# Patient Record
Sex: Male | Born: 1940 | Race: White | Hispanic: No | Marital: Married | Smoking: Former smoker
Health system: Southern US, Community
[De-identification: ages and names within clinical notes are randomized; demographics above are authoritative.]

## PROBLEM LIST (undated history)

## (undated) DIAGNOSIS — I2510 Atherosclerotic heart disease of native coronary artery without angina pectoris: Secondary | ICD-10-CM

## (undated) DIAGNOSIS — M1990 Unspecified osteoarthritis, unspecified site: Secondary | ICD-10-CM

## (undated) DIAGNOSIS — N185 Chronic kidney disease, stage 5: Secondary | ICD-10-CM

## (undated) DIAGNOSIS — I4891 Unspecified atrial fibrillation: Secondary | ICD-10-CM

## (undated) DIAGNOSIS — D759 Disease of blood and blood-forming organs, unspecified: Secondary | ICD-10-CM

## (undated) DIAGNOSIS — M5431 Sciatica, right side: Secondary | ICD-10-CM

## (undated) DIAGNOSIS — Z950 Presence of cardiac pacemaker: Secondary | ICD-10-CM

## (undated) DIAGNOSIS — J189 Pneumonia, unspecified organism: Secondary | ICD-10-CM

## (undated) DIAGNOSIS — G4733 Obstructive sleep apnea (adult) (pediatric): Secondary | ICD-10-CM

## (undated) DIAGNOSIS — D649 Anemia, unspecified: Secondary | ICD-10-CM

## (undated) DIAGNOSIS — F419 Anxiety disorder, unspecified: Secondary | ICD-10-CM

## (undated) DIAGNOSIS — I509 Heart failure, unspecified: Secondary | ICD-10-CM

## (undated) DIAGNOSIS — I219 Acute myocardial infarction, unspecified: Secondary | ICD-10-CM

## (undated) DIAGNOSIS — IMO0001 Reserved for inherently not codable concepts without codable children: Secondary | ICD-10-CM

## (undated) DIAGNOSIS — N3091 Cystitis, unspecified with hematuria: Secondary | ICD-10-CM

## (undated) DIAGNOSIS — IMO0002 Reserved for concepts with insufficient information to code with codable children: Secondary | ICD-10-CM

## (undated) DIAGNOSIS — S46219A Strain of muscle, fascia and tendon of other parts of biceps, unspecified arm, initial encounter: Secondary | ICD-10-CM

## (undated) DIAGNOSIS — B351 Tinea unguium: Secondary | ICD-10-CM

## (undated) DIAGNOSIS — I38 Endocarditis, valve unspecified: Secondary | ICD-10-CM

## (undated) DIAGNOSIS — I10 Essential (primary) hypertension: Secondary | ICD-10-CM

## (undated) DIAGNOSIS — D869 Sarcoidosis, unspecified: Secondary | ICD-10-CM

## (undated) DIAGNOSIS — M7138 Other bursal cyst, other site: Secondary | ICD-10-CM

## (undated) DIAGNOSIS — K759 Inflammatory liver disease, unspecified: Secondary | ICD-10-CM

## (undated) DIAGNOSIS — T4145XA Adverse effect of unspecified anesthetic, initial encounter: Secondary | ICD-10-CM

## (undated) DIAGNOSIS — E119 Type 2 diabetes mellitus without complications: Secondary | ICD-10-CM

## (undated) DIAGNOSIS — I4892 Unspecified atrial flutter: Secondary | ICD-10-CM

## (undated) HISTORY — PX: PORTACATH PLACEMENT: SHX2246

## (undated) HISTORY — DX: Atherosclerotic heart disease of native coronary artery without angina pectoris: I25.10

## (undated) HISTORY — DX: Unspecified atrial fibrillation: I48.91

## (undated) HISTORY — DX: Acute myocardial infarction, unspecified: I21.9

## (undated) HISTORY — DX: Hypercalcemia: E83.52

## (undated) HISTORY — PX: HERNIA REPAIR: SHX51

## (undated) HISTORY — DX: Disease of blood and blood-forming organs, unspecified: D75.9

## (undated) HISTORY — DX: Sarcoidosis, unspecified: D86.9

## (undated) HISTORY — DX: Obstructive sleep apnea (adult) (pediatric): G47.33

## (undated) HISTORY — PX: CARDIAC VALVE REPLACEMENT: SHX585

## (undated) HISTORY — PX: CARDIOVERSION: SHX1299

## (undated) HISTORY — DX: Essential (primary) hypertension: I10

## (undated) HISTORY — PX: OTHER SURGICAL HISTORY: SHX169

## (undated) HISTORY — DX: Endocarditis, valve unspecified: I38

## (undated) HISTORY — DX: Cystitis, unspecified with hematuria: N30.91

## (undated) HISTORY — DX: Chronic kidney disease, stage 5: N18.5

---

## 1975-12-25 HISTORY — PX: VASECTOMY: SHX75

## 1999-02-28 ENCOUNTER — Ambulatory Visit (HOSPITAL_COMMUNITY): Admission: RE | Admit: 1999-02-28 | Discharge: 1999-02-28 | Payer: Self-pay | Admitting: Cardiology

## 2000-12-30 ENCOUNTER — Encounter (INDEPENDENT_AMBULATORY_CARE_PROVIDER_SITE_OTHER): Payer: Self-pay | Admitting: Specialist

## 2000-12-30 ENCOUNTER — Other Ambulatory Visit: Admission: RE | Admit: 2000-12-30 | Discharge: 2000-12-30 | Payer: Self-pay | Admitting: Urology

## 2001-01-31 ENCOUNTER — Encounter: Payer: Self-pay | Admitting: Urology

## 2001-02-07 ENCOUNTER — Observation Stay: Admission: RE | Admit: 2001-02-07 | Discharge: 2001-02-09 | Payer: Self-pay | Admitting: Urology

## 2001-04-21 ENCOUNTER — Encounter: Payer: Self-pay | Admitting: Internal Medicine

## 2001-12-29 ENCOUNTER — Encounter: Payer: Self-pay | Admitting: Cardiology

## 2001-12-29 ENCOUNTER — Inpatient Hospital Stay (HOSPITAL_COMMUNITY): Admission: EM | Admit: 2001-12-29 | Discharge: 2002-01-02 | Payer: Self-pay | Admitting: Emergency Medicine

## 2004-12-24 HISTORY — PX: CORONARY ARTERY BYPASS GRAFT: SHX141

## 2005-09-01 ENCOUNTER — Inpatient Hospital Stay (HOSPITAL_COMMUNITY): Admission: EM | Admit: 2005-09-01 | Discharge: 2005-09-18 | Payer: Self-pay | Admitting: Emergency Medicine

## 2005-09-10 ENCOUNTER — Encounter: Payer: Self-pay | Admitting: Surgery

## 2005-11-23 ENCOUNTER — Ambulatory Visit (HOSPITAL_COMMUNITY): Admission: RE | Admit: 2005-11-23 | Discharge: 2005-11-23 | Payer: Self-pay | Admitting: Cardiology

## 2005-12-24 HISTORY — PX: OTHER SURGICAL HISTORY: SHX169

## 2006-01-24 ENCOUNTER — Encounter (HOSPITAL_COMMUNITY): Admission: RE | Admit: 2006-01-24 | Discharge: 2006-04-24 | Payer: Self-pay | Admitting: Cardiology

## 2006-04-25 ENCOUNTER — Encounter (HOSPITAL_COMMUNITY): Admission: RE | Admit: 2006-04-25 | Discharge: 2006-07-24 | Payer: Self-pay | Admitting: Cardiology

## 2007-06-03 ENCOUNTER — Ambulatory Visit: Payer: Self-pay | Admitting: Vascular Surgery

## 2007-10-30 ENCOUNTER — Ambulatory Visit (HOSPITAL_COMMUNITY): Admission: RE | Admit: 2007-10-30 | Discharge: 2007-10-30 | Payer: Self-pay | Admitting: Endocrinology

## 2007-11-11 ENCOUNTER — Ambulatory Visit (HOSPITAL_COMMUNITY): Admission: RE | Admit: 2007-11-11 | Discharge: 2007-11-11 | Payer: Self-pay | Admitting: Endocrinology

## 2007-11-11 ENCOUNTER — Ambulatory Visit: Payer: Self-pay | Admitting: Oncology

## 2007-11-11 LAB — CBC & DIFF AND RETIC
BASO%: 0.5 % (ref 0.0–2.0)
Eosinophils Absolute: 0.2 10*3/uL (ref 0.0–0.5)
IRF: 0.37 (ref 0.070–0.380)
LYMPH%: 12.7 % — ABNORMAL LOW (ref 14.0–48.0)
MCHC: 34.8 g/dL (ref 32.0–35.9)
MONO#: 0.6 10*3/uL (ref 0.1–0.9)
NEUT#: 3 10*3/uL (ref 1.5–6.5)
Platelets: 143 10*3/uL — ABNORMAL LOW (ref 145–400)
RBC: 3.64 10*6/uL — ABNORMAL LOW (ref 4.20–5.71)
RDW: 15.9 % — ABNORMAL HIGH (ref 11.2–14.6)
RETIC #: 61.9 10*3/uL (ref 31.8–103.9)
Retic %: 1.7 % (ref 0.7–2.3)
WBC: 4.4 10*3/uL (ref 4.0–10.0)
lymph#: 0.6 10*3/uL — ABNORMAL LOW (ref 0.9–3.3)

## 2007-11-11 LAB — PROTHROMBIN TIME: Prothrombin Time: 15.2 seconds (ref 11.6–15.2)

## 2007-11-11 LAB — APTT: aPTT: 34 seconds (ref 24–37)

## 2007-11-11 LAB — ERYTHROCYTE SEDIMENTATION RATE: Sed Rate: 47 mm/hr — ABNORMAL HIGH (ref 0–20)

## 2007-11-11 LAB — MORPHOLOGY: PLT EST: ADEQUATE

## 2007-11-12 ENCOUNTER — Ambulatory Visit (HOSPITAL_COMMUNITY): Admission: RE | Admit: 2007-11-12 | Discharge: 2007-11-12 | Payer: Self-pay | Admitting: Endocrinology

## 2007-11-13 LAB — KAPPA/LAMBDA LIGHT CHAINS
Kappa free light chain: 1.67 mg/dL (ref 0.33–1.94)
Lambda Free Lght Chn: 2.83 mg/dL — ABNORMAL HIGH (ref 0.57–2.63)

## 2007-11-13 LAB — COMPREHENSIVE METABOLIC PANEL
AST: 13 U/L (ref 0–37)
Alkaline Phosphatase: 60 U/L (ref 39–117)
BUN: 22 mg/dL (ref 6–23)
Creatinine, Ser: 1.16 mg/dL (ref 0.40–1.50)

## 2007-11-13 LAB — CANCER ANTIGEN 19-9: CA 19-9: 13.5 U/mL (ref <35.0)

## 2007-11-13 LAB — FOLATE: Folate: >20 ng/mL

## 2007-11-13 LAB — PSA: PSA: 4.84 ng/mL — ABNORMAL HIGH (ref 0.10–4.00)

## 2007-11-13 LAB — IMMUNOFIXATION ELECTROPHORESIS: IgM, Serum: 92 mg/dL (ref 60–263)

## 2007-11-13 LAB — BETA 2 MICROGLOBULIN, SERUM: Beta-2 Microglobulin: 3.59 mg/L — ABNORMAL HIGH (ref 1.01–1.73)

## 2007-11-13 LAB — VITAMIN B12: Vitamin B-12: 507 pg/mL (ref 211–911)

## 2007-11-19 LAB — URINALYSIS, MICROSCOPIC - CHCC
Bilirubin (Urine): NEGATIVE
Leukocyte Esterase: NEGATIVE
Nitrite: NEGATIVE
pH: 7 (ref 4.6–8.0)

## 2007-11-19 LAB — COMPREHENSIVE METABOLIC PANEL
AST: 21 U/L (ref 0–37)
Albumin: 3.3 g/dL — ABNORMAL LOW (ref 3.5–5.2)
BUN: 19 mg/dL (ref 6–23)
Calcium: 12.7 mg/dL — ABNORMAL HIGH (ref 8.4–10.5)
Chloride: 97 mEq/L (ref 96–112)
Creatinine, Ser: 1.19 mg/dL (ref 0.40–1.50)
Glucose, Bld: 273 mg/dL — ABNORMAL HIGH (ref 70–99)

## 2007-11-22 LAB — URINE CULTURE

## 2007-11-25 ENCOUNTER — Inpatient Hospital Stay (HOSPITAL_COMMUNITY): Admission: EM | Admit: 2007-11-25 | Discharge: 2007-11-28 | Payer: Self-pay | Admitting: Oncology

## 2007-11-25 ENCOUNTER — Ambulatory Visit: Payer: Self-pay | Admitting: Oncology

## 2007-11-27 ENCOUNTER — Encounter (INDEPENDENT_AMBULATORY_CARE_PROVIDER_SITE_OTHER): Payer: Self-pay | Admitting: Interventional Radiology

## 2007-11-27 ENCOUNTER — Encounter (INDEPENDENT_AMBULATORY_CARE_PROVIDER_SITE_OTHER): Payer: Self-pay | Admitting: General Surgery

## 2007-12-02 LAB — COMPREHENSIVE METABOLIC PANEL
Albumin: 3.1 g/dL — ABNORMAL LOW (ref 3.5–5.2)
Alkaline Phosphatase: 48 U/L (ref 39–117)
BUN: 25 mg/dL — ABNORMAL HIGH (ref 6–23)
Calcium: 10.3 mg/dL (ref 8.4–10.5)
Chloride: 100 mEq/L (ref 96–112)
Glucose, Bld: 336 mg/dL — ABNORMAL HIGH (ref 70–99)
Potassium: 4.2 mEq/L (ref 3.5–5.3)
Sodium: 132 mEq/L — ABNORMAL LOW (ref 135–145)
Total Protein: 6.4 g/dL (ref 6.0–8.3)

## 2007-12-02 LAB — CBC WITH DIFFERENTIAL/PLATELET
Basophils Absolute: 0 10*3/uL (ref 0.0–0.1)
Eosinophils Absolute: 0 10*3/uL (ref 0.0–0.5)
HGB: 11.2 g/dL — ABNORMAL LOW (ref 13.0–17.1)
MCV: 87 fL (ref 81.6–98.0)
MONO#: 0.5 10*3/uL (ref 0.1–0.9)
NEUT#: 4.7 10*3/uL (ref 1.5–6.5)
RBC: 3.67 10*6/uL — ABNORMAL LOW (ref 4.20–5.71)
RDW: 16.6 % — ABNORMAL HIGH (ref 11.2–14.6)
WBC: 5.6 10*3/uL (ref 4.0–10.0)
lymph#: 0.4 10*3/uL — ABNORMAL LOW (ref 0.9–3.3)

## 2007-12-23 ENCOUNTER — Ambulatory Visit: Payer: Self-pay | Admitting: Oncology

## 2008-02-23 ENCOUNTER — Ambulatory Visit: Payer: Self-pay | Admitting: Oncology

## 2008-03-19 ENCOUNTER — Encounter: Admission: RE | Admit: 2008-03-19 | Discharge: 2008-03-19 | Payer: Self-pay | Admitting: Endocrinology

## 2008-04-26 ENCOUNTER — Ambulatory Visit: Payer: Self-pay | Admitting: Oncology

## 2008-06-21 ENCOUNTER — Ambulatory Visit: Payer: Self-pay | Admitting: Oncology

## 2008-07-22 IMAGING — CR DG CHEST 1V PORT
1 series · 1 of 1 positions shown · non-contrast
Comparison: 11/23/2005

CLINICAL DATA: Port-A-Cath placement

PORTABLE CHEST - 1 VIEW

[view not recorded]
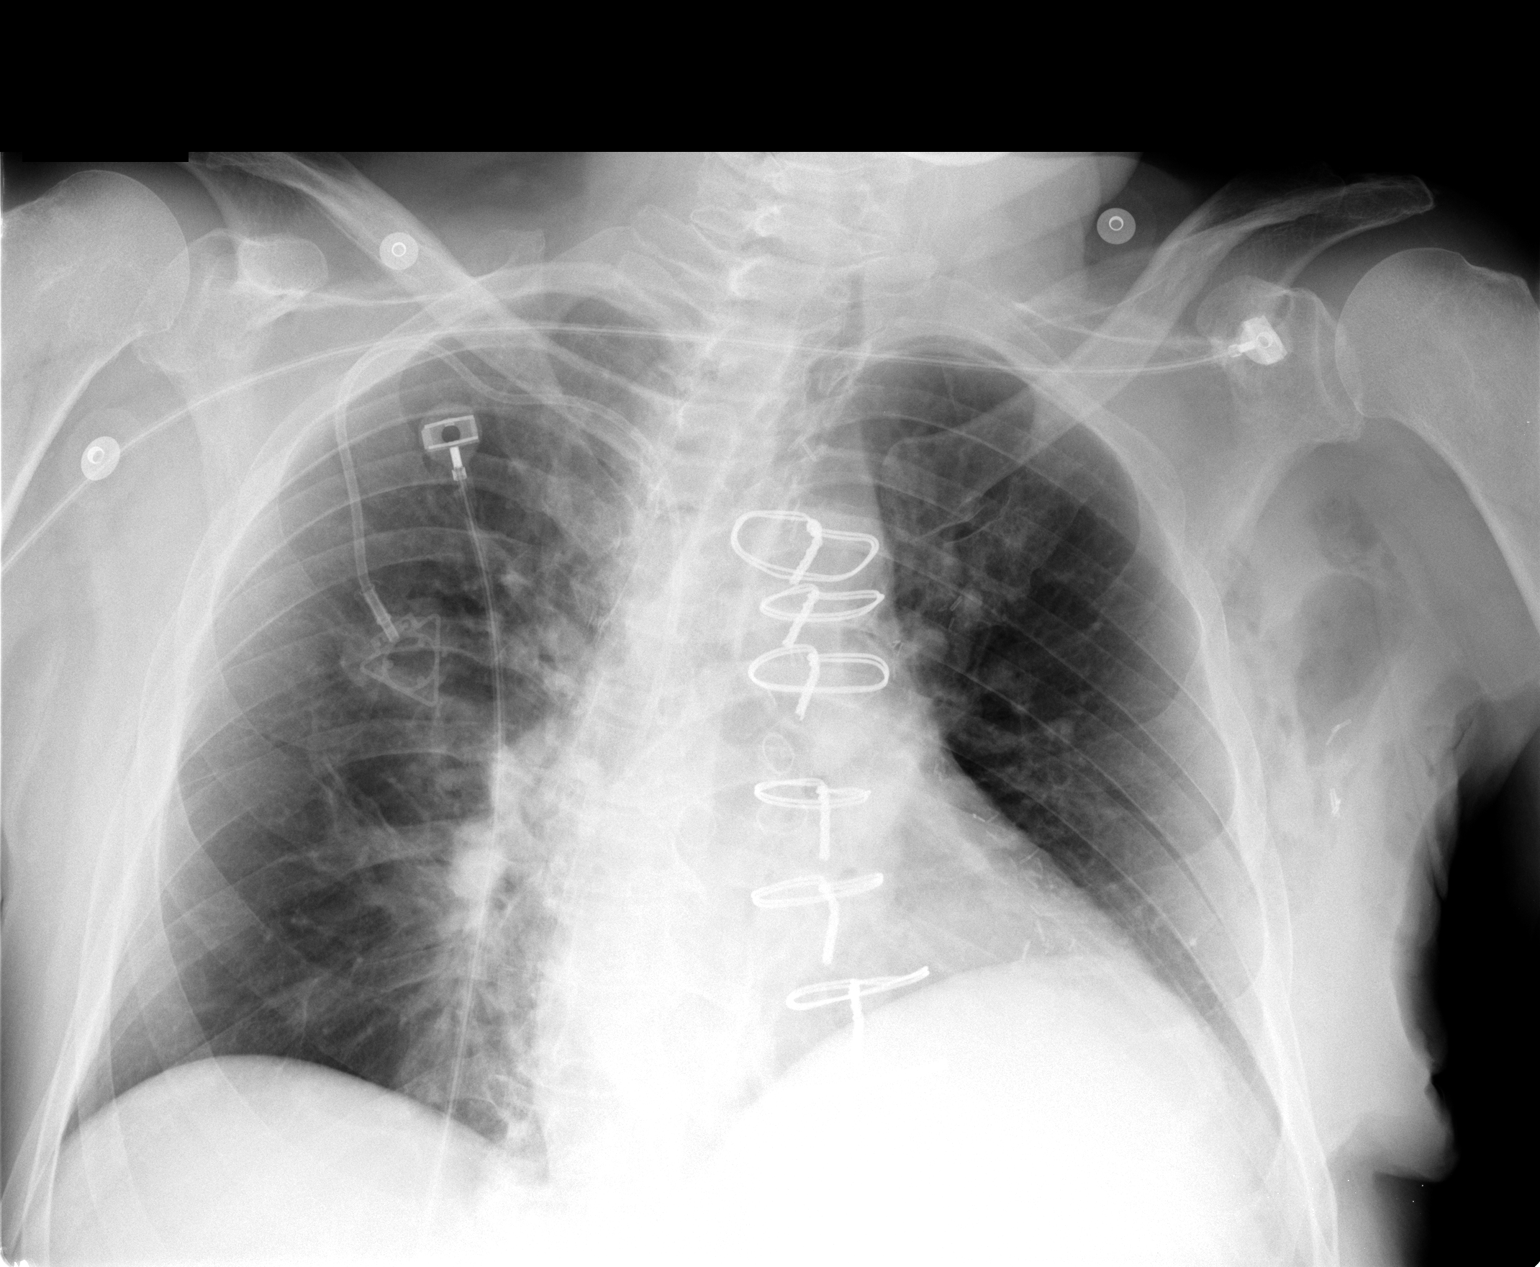

[1 of 1 positions shown; findings below may reference images not displayed]

FINDINGS: Right-sided Port-A-Cath noted with tip projecting over the superior
vena cava. No pneumothorax. There is gas projecting in the left axilla adjacent
to several axillary clips. Patient is moderately rotated the left on today's
exam.

Mild cardiomegaly appear stable. Prior CABG noted.  

IMPRESSION

1. Right-sided Port-A-Cath tip: SVC. No pneumothorax identified.
2. Gas projects of the left axilla, query recent intervention in this region.
3. Cardiomegaly.

## 2008-08-20 ENCOUNTER — Ambulatory Visit: Payer: Self-pay | Admitting: Oncology

## 2008-10-25 ENCOUNTER — Ambulatory Visit: Payer: Self-pay | Admitting: Oncology

## 2008-12-23 ENCOUNTER — Ambulatory Visit: Payer: Self-pay | Admitting: Oncology

## 2009-08-24 ENCOUNTER — Encounter: Admission: RE | Admit: 2009-08-24 | Discharge: 2009-08-24 | Payer: Self-pay | Admitting: Cardiology

## 2009-12-21 ENCOUNTER — Encounter: Admission: RE | Admit: 2009-12-21 | Discharge: 2009-12-21 | Payer: Self-pay | Admitting: Endocrinology

## 2009-12-24 DIAGNOSIS — D869 Sarcoidosis, unspecified: Secondary | ICD-10-CM

## 2009-12-24 HISTORY — DX: Hypercalcemia: E83.52

## 2009-12-24 HISTORY — DX: Sarcoidosis, unspecified: D86.9

## 2010-02-28 ENCOUNTER — Encounter: Admission: RE | Admit: 2010-02-28 | Discharge: 2010-02-28 | Payer: Self-pay | Admitting: Endocrinology

## 2010-03-14 ENCOUNTER — Encounter (INDEPENDENT_AMBULATORY_CARE_PROVIDER_SITE_OTHER): Payer: Self-pay | Admitting: *Deleted

## 2010-05-23 ENCOUNTER — Encounter: Admission: RE | Admit: 2010-05-23 | Discharge: 2010-05-23 | Payer: Self-pay | Admitting: Endocrinology

## 2010-05-23 ENCOUNTER — Ambulatory Visit (HOSPITAL_COMMUNITY): Admission: RE | Admit: 2010-05-23 | Discharge: 2010-05-23 | Payer: Self-pay | Admitting: Endocrinology

## 2010-07-24 ENCOUNTER — Ambulatory Visit: Payer: Self-pay | Admitting: Cardiology

## 2010-07-24 ENCOUNTER — Ambulatory Visit (HOSPITAL_COMMUNITY): Admission: RE | Admit: 2010-07-24 | Discharge: 2010-07-24 | Payer: Self-pay | Admitting: Cardiology

## 2010-07-25 ENCOUNTER — Encounter: Payer: Self-pay | Admitting: Internal Medicine

## 2010-09-19 ENCOUNTER — Encounter: Payer: Self-pay | Admitting: Internal Medicine

## 2010-09-28 ENCOUNTER — Telehealth (INDEPENDENT_AMBULATORY_CARE_PROVIDER_SITE_OTHER): Payer: Self-pay | Admitting: *Deleted

## 2010-10-09 ENCOUNTER — Encounter: Payer: Self-pay | Admitting: Internal Medicine

## 2010-10-24 ENCOUNTER — Ambulatory Visit: Payer: Self-pay | Admitting: Cardiology

## 2010-11-02 ENCOUNTER — Encounter: Payer: Self-pay | Admitting: Internal Medicine

## 2010-11-09 ENCOUNTER — Encounter: Payer: Self-pay | Admitting: Internal Medicine

## 2010-11-24 ENCOUNTER — Telehealth (INDEPENDENT_AMBULATORY_CARE_PROVIDER_SITE_OTHER): Payer: Self-pay | Admitting: *Deleted

## 2010-11-24 ENCOUNTER — Ambulatory Visit: Payer: Self-pay | Admitting: Internal Medicine

## 2010-11-24 DIAGNOSIS — D869 Sarcoidosis, unspecified: Secondary | ICD-10-CM

## 2010-11-24 DIAGNOSIS — I359 Nonrheumatic aortic valve disorder, unspecified: Secondary | ICD-10-CM

## 2010-11-24 DIAGNOSIS — R0602 Shortness of breath: Secondary | ICD-10-CM

## 2010-11-24 DIAGNOSIS — G4733 Obstructive sleep apnea (adult) (pediatric): Secondary | ICD-10-CM

## 2010-11-27 ENCOUNTER — Telehealth: Payer: Self-pay | Admitting: Internal Medicine

## 2010-11-27 ENCOUNTER — Encounter: Payer: Self-pay | Admitting: Internal Medicine

## 2010-11-27 DIAGNOSIS — I259 Chronic ischemic heart disease, unspecified: Secondary | ICD-10-CM

## 2010-11-27 DIAGNOSIS — I219 Acute myocardial infarction, unspecified: Secondary | ICD-10-CM | POA: Insufficient documentation

## 2010-11-27 DIAGNOSIS — I38 Endocarditis, valve unspecified: Secondary | ICD-10-CM | POA: Insufficient documentation

## 2010-11-28 ENCOUNTER — Telehealth: Payer: Self-pay | Admitting: Internal Medicine

## 2010-11-29 ENCOUNTER — Encounter: Payer: Self-pay | Admitting: Internal Medicine

## 2010-11-30 ENCOUNTER — Encounter: Payer: Self-pay | Admitting: Internal Medicine

## 2010-11-30 ENCOUNTER — Ambulatory Visit: Payer: Self-pay | Admitting: Internal Medicine

## 2010-12-04 ENCOUNTER — Encounter: Payer: Self-pay | Admitting: Internal Medicine

## 2010-12-08 ENCOUNTER — Ambulatory Visit: Payer: Self-pay | Admitting: Cardiology

## 2010-12-13 ENCOUNTER — Ambulatory Visit: Payer: Self-pay | Admitting: Internal Medicine

## 2010-12-19 ENCOUNTER — Ambulatory Visit: Payer: Self-pay

## 2010-12-21 ENCOUNTER — Ambulatory Visit (HOSPITAL_COMMUNITY)
Admission: RE | Admit: 2010-12-21 | Discharge: 2010-12-21 | Payer: Self-pay | Source: Home / Self Care | Attending: Cardiology | Admitting: Cardiology

## 2010-12-21 ENCOUNTER — Encounter: Payer: Self-pay | Admitting: Cardiology

## 2010-12-22 ENCOUNTER — Encounter: Payer: Self-pay | Admitting: Internal Medicine

## 2010-12-24 ENCOUNTER — Telehealth: Payer: Self-pay | Admitting: Internal Medicine

## 2010-12-24 DIAGNOSIS — N3091 Cystitis, unspecified with hematuria: Secondary | ICD-10-CM

## 2010-12-24 HISTORY — DX: Cystitis, unspecified with hematuria: N30.91

## 2011-01-12 ENCOUNTER — Ambulatory Visit
Admission: RE | Admit: 2011-01-12 | Discharge: 2011-01-12 | Payer: Self-pay | Source: Home / Self Care | Attending: Internal Medicine | Admitting: Internal Medicine

## 2011-01-14 ENCOUNTER — Encounter: Payer: Self-pay | Admitting: General Surgery

## 2011-01-16 ENCOUNTER — Encounter: Payer: Self-pay | Admitting: Internal Medicine

## 2011-01-17 ENCOUNTER — Telehealth: Payer: Self-pay | Admitting: Internal Medicine

## 2011-01-18 ENCOUNTER — Ambulatory Visit: Payer: Self-pay | Admitting: Cardiology

## 2011-01-19 ENCOUNTER — Encounter: Payer: Self-pay | Admitting: Internal Medicine

## 2011-01-19 ENCOUNTER — Encounter
Admission: RE | Admit: 2011-01-19 | Discharge: 2011-01-19 | Payer: Self-pay | Source: Home / Self Care | Attending: Rheumatology | Admitting: Rheumatology

## 2011-01-23 NOTE — Progress Notes (Signed)
Summary: xray results  Phone Note Call from Patient Call back at Home Phone [phone]   Caller: Patient Call For: clance Reason for Call: Lab or Test Results Summary of Call: Requests results of cxr. Initial call taken by: Darletta Moll,  November 27, 2010 3:57 PM  Follow-up for Phone Call        Pls advise on CXR results. Report printd to side B.Michel Bickers [facility]  November 27, 2010 4:07 PM  Additional Follow-up for Phone Call Additional follow up Details #1::        I discussed his CXR. He is coughing a little yellow, but not obviously infected. the images look like CHF wit pulmonary edema and some increase in riight pleural effusion which is probably cardiogenic. He and i would both prefer to address it medically, through Dr Patty Sermons, saving thoracentesis for effusion that resists diuresis.  Additional Follow-up by: Waymon Budge MD,  November 27, 2010 5:45 PM

## 2011-01-23 NOTE — Progress Notes (Signed)
Summary: Schedule Colonscopy  Phone Note Outgoing Call Call back at [facility] Phone [phone]   Call placed by: Harlow Mares CMA Duncan Dull),  September 28, 2010 4:41 PM Call placed to: Patient Summary of Call: called patient he states that he is very busy right now and will call back to schedule his colonoscopy at a later time.  Initial call taken by: Harlow Mares CMA Duncan Dull),  September 28, 2010 4:42 PM

## 2011-01-23 NOTE — Assessment & Plan Note (Signed)
Summary: sleep evaluation/ mbw   Primary Provider/Referring Provider:  Patty Sermons  CC:  Sleep Consult-Dr. Patty Sermons; Having SOB and sleep study about 10 years ago at [facility]. REcently come off of PRednisone(was on for about 3 years)..  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  Nurse:Sleep Consult-Dr. Patty Sermons; Having SOB and sleep study about 10 years ago at [facility]. REcently come off of PRednisone(was on for about 3 years). He asks evaluation for shortness of breath, with hx of Sarcoid, Sleep Apnea, Aortic Stenosis and Coronary Disease.  Three years ago work up for hypercalcemia led to dx of Sarcoid by left axillary node bx, with involvement mainly of bone marrow. Treated with prednisone 5-20 mg daily 2008-2011, finally weaning off prednisone in October, 2011. He began noting increased dyspnea and wheeze in June, around the time onset of CHF was attributed to Actos. He was begun on O2 @ 3 L/M. He asks if dyspnea now is due to steroid withdrawal . Waking short of breath and turning his oxygen as high as 5 L/m at night. Variable exertional dyspnea in daytime with wet cough x 1 month.  NPSG at [facility] 2006 when he weighed 300 lbs, dx'd OSA and he has been using CPAP at 10, prescribed O2 for sleep 3-5 L/M, nasal mask [employer] [employer] [facility]). CAD, HTN and mod to severe Aortic Stenosis- MI x 3, quintuple bypass CABG 2006. Atrial fib, RBBB. CHF associated w/ Actos. Stent. Bedtime 11- 2 AM, latency 30 min, waso 6x, finally up 10- 11AM. Taking Dilaudid 1 mg and Valium 5 mg to suppress waking from sleep.  Preventive Screening-Counseling & Management  Alcohol-Tobacco     Smoking Status: quit     Year Quit: 1997 Cigars  Current Medications (verified): 1)  Allopurinol 300 Mg Tabs (Allopurinol) .... Take 1 By Mouth Once Daily 2)  Aspirin 81 Mg Chew (Aspirin) .... Take 1 By Mouth Once Daily 3)  Klor-Con M20 20 Meq Cr-Tabs (Potassium  Chloride Crys Cr) .... Take 1 By Mouth Once Daily 4)  Lanoxin 0.25 Mg Tabs (Digoxin) .... Take 1 By Mouth Once Daily 5)  Furosemide 80 Mg Tabs (Furosemide) .... Take 1 By Mouth Once Daily 6)  Lantus 100 Unit/ml Soln (Insulin Glargine) .... 36 Units As Directed 7)  Lopressor 50 Mg Tabs (Metoprolol Tartrate) .... Take 1 By Mouth Two Times A Day 8)  Terazosin Hcl 10 Mg Caps (Terazosin Hcl) .... Take 1 By Mouth Once Daily 9)  Ambien 10 Mg Tabs (Zolpidem Tartrate) .... Take 1 By Mouth At Bedtime As Needed 10)  Androderm 5 Mg/24hr Pt24 (Testosterone) .... Topical As Directed 11)  Magnesium 500 Mg Tabs (Magnesium) .... Take 1 By Mouth Two Times A Day 12)  Vitamin C 1000 Mg Tabs (Ascorbic Acid) .... Take 1 By Mouth Two Times A Day  Allergies (verified): 1)  ! Pcn 2)  ! * Menocyn 3)  ! * Actos 4)  ! * Amarodione 5)  ! * Right Arm Only  Past History:  Family History: Last updated: 11/24/2010 FAmily hx of allergies, asthma,heart disease, and cancer. Father- died MI age 43 Mother- died old age 49  Social History: Last updated: 11/24/2010 Married [employer]) with Children Exsmoker-quit in 1997(cigars and pipe). 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 Prednisone 2008-2011.  Past Surgical History: CABG 2006 Cataract- 2007 TUNA prostate procedure 2002  Family History: FAmily hx of allergies, asthma,heart disease, and cancer. Father- died MI age 67 Mother- died old age 61  Social History: Married [employer]) with Children Exsmoker-quit in 1997(cigars and pipe). 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  The patient denies coughing up blood, acid heartburn, indigestion, abdominal pain, difficulty swallowing, sore throat, tooth/dental problems, headaches, itching, ear ache, anxiety, depression, rash, change in color of mucus, and fever.         pain in ribs and back with deep breath Reports hx silent angina  Vital Signs:  Patient profile:   70 year old male Weight:      238 pounds  Vitals Entered By: Reynaldo Minium CMA (November 24, 2010 10:44 AM)  Physical Exam  Additional Exam:  General: A/Ox3; pleasant and cooperative, NAD, carried portable O2 tank, not being used. Overweight. He has an incidental cold.  SKIN: no rash, lesions NODES: no lymphadenopathy HEENT: [place]/AT, EOM- WNL, Conjuctivae- clear, PERRLA, TM-WNL, Nose- clear, Throat- clear and wnl NECK: Supple w/ fair ROM, JVD- none, normal carotid impulses w/o bruits Thyroid- normal to palpation CHEST: Clear to P&A, no definite rales or rhonchi w/ quiet breathing, but wheeze whith deep breath.  HEART: RRR, 2-3/ 6 aortic stenosis murmur to carotids ABDOMEN: Soft and nl; nml bowel sounds; no organomegaly or masses noted ZOX:WRUE, nl pulses, edema 2+ R>L, no cords.   NEURO: Grossly intact to observation       Impression & Recommendations:  Problem # 1:  OBSTRUCTIVE SLEEP APNEA (ICD-327.23)  We will get autotitration and overnight oximetry to assess what is going on at night. He is not sure if his CPAP is set on 8 or 10 and we will need to try to get that from his DME company.  Frequent CPAP disconnect for up for dyspnea or bathroom This is likely to reduce the effectiveness of CPAP and O2.   Orders: Consultation Level IV (45409) DME Referral (DME)  Problem # 2:   DYSPNEA (ICD-786.05)  Dyspnea evaluation with obvious components of CHF, Aortic stenosis, obesity. Unsure about motor weakness after chronic steroids. We will do CXR,  PFT and 6 MWT, and overnight oximetry.   Orders: Consultation Level IV (81191) DME Referral (DME)  Problem # 3:  SARCOIDOSIS (ICD-135) Now off steroids. this is an atypical presentation in an older white male, so it is harder to be confident it will go into remission.  Medications Added to Medication List This Visit: 1)  Allopurinol 300 Mg Tabs (Allopurinol) .... Take 1 by mouth once daily 2)  Aspirin 81 Mg Chew (Aspirin) .... Take 1 by mouth once daily 3)  Klor-con M20 20 Meq Cr-tabs (Potassium chloride crys cr) .... Take 1 by mouth once daily 4)  Lanoxin 0.25 Mg Tabs (Digoxin) .... Take 1 by mouth once daily 5)  Furosemide 80 Mg Tabs (Furosemide) .... Take 1 by mouth once daily 6)  Lantus 100 Unit/ml Soln (Insulin glargine) .... 36 units as directed 7)  Lopressor 50 Mg Tabs (Metoprolol tartrate) .Marland KitchenMarland KitchenMarland Kitchen  Take 1 by mouth two times a day 8)  Terazosin Hcl 10 Mg Caps (Terazosin hcl) .... Take 1 by mouth once daily 9)  Ambien 10 Mg Tabs (Zolpidem tartrate) .... Take 1 by mouth at bedtime as needed 10)  Androderm 5 Mg/24hr Pt24 (Testosterone) .... Topical as directed 11)  Magnesium 500 Mg Tabs (Magnesium) .... Take 1 by mouth two times a day 12)  Vitamin C 1000 Mg Tabs (Ascorbic acid) .... Take 1 by mouth two times a day  Other Orders: T-Angiotensin i-Converting Enzyme ([id number]) Misc. Referral (Misc. Ref) T-2 View CXR ([id number])  Patient Instructions: 1)  Please schedule a follow-up appointment in 3 weeks. 2)  Lab- We will get ACE level run on the blood work drawn by Dr Dagoberto Ligas 3)  A chest x-ray has been recommended.  Your imaging study may require preauthorization.  4)  See PCC to schedule PFT, 6 MWT, CPAP autotiration and overnight oximetry on 2 L oxygen.  5)  cc Dr Patty Sermons, Dr Dagoberto Ligas

## 2011-01-23 NOTE — Miscellaneous (Signed)
Summary: Orders Update pft charges  Clinical Lists Changes  Orders: Added new Service order of Carbon Monoxide diffusing w/capacity (94720) - Signed Added new Service order of Lung Volumes (94240) - Signed Added new Service order of Spirometry (Pre & Post) (94060) - Signed 

## 2011-01-23 NOTE — Progress Notes (Signed)
Summary: speak to nurse  Phone Note Call from Patient Call back at Home Phone [phone]   Caller: Patient Call For: Meera Vasco Summary of Call: Wants to speak to nurse about xray results. Initial call taken by: Darletta Moll,  November 28, 2010 10:42 AM  Follow-up for Phone Call        Pt says he cannot wear cpap when at the lower pressure of 5. This wakes him up and he feels like he is gasping for air. Pt is requesting that pressure not be set this low during auto titrate. He is also requesting that a copy of his last CXR report be sent to Dr. Patty Sermons. Report sent to [facility]. Pls advise on cpap.Michel Bickers [facility]  November 28, 2010 11:34 AM  Additional Follow-up for Phone Call Additional follow up Details #1::        OK.     Additional Follow-up for Phone Call Additional follow up Details #2::    Spoke with patient and he had a copy of sleep study from [facility] and faxed study over here. Gave study to Dr. Maple Hudson. Order faxed to [facility] to arrange to change cpap pressure to auto mode Range 10-20 cwp, h/h x 7 days. Download faxed to Dr. Maple Hudson at [phone].  Advised pt of the above and advised him to call us if he had any problems with this. Pt and wife aware. [facility] called and stated that they were going out today to change pressure on cpap to auto mode. Alfonso Ramus  November 28, 2010 3:49 PM

## 2011-01-23 NOTE — Letter (Signed)
Summary: Colonoscopy Letter  [facility]  [address] [place], [place] [address]   Phone: [phone]  Fax: [phone]      March 14, 2010 MRN: [id number]   [facility] [address] [place], [place]  [address]   Dear Mr. Grade,   According to your medical record, it is time for you to schedule a Colonoscopy. The American Cancer Society recommends this procedure as a method to detect early colon cancer. Patients with a family history of colon cancer, or a personal history of colon polyps or inflammatory bowel disease are at increased risk.  This letter has been generated based on the recommendations made at the time of your procedure. If you feel that in your particular situation this may no longer apply, please contact our office.  Please call our office at [phone] to schedule this appointment or to update your records at your earliest convenience.  Thank you for cooperating with [place] to provide you with the very best care possible.   Sincerely,  Rachael Fee, M.D.  [facility] [phone]

## 2011-01-23 NOTE — Progress Notes (Signed)
Summary: 3 WK F/U W/ CY  Phone Note Call from Patient Call back at Home Phone [phone]   Caller: Patient Call For: YOUNG Summary of Call: PT NEEDS A ROV IN 3 WKS. PLS ADVISE- CALL PT AT HOME #.  Initial call taken by: Tivis Ringer, CNA,  November 24, 2010 12:06 PM  Follow-up for Phone Call        Please have patient come in on 12-13-10 at 1115am for 1130am appt.Reynaldo Minium CMA  November 24, 2010 1:31 PM   Called, spoke with pt and pt's wife.  Both are aware of appt date/time listed above and ok with this.  Appt scheduled.   Gweneth Dimitri RN  November 24, 2010 2:33 PM

## 2011-01-25 NOTE — Progress Notes (Signed)
Summary: O2 sat download  Phone Note From Other Clinic   Summary of Call: Dr Yevonne Pax office sent results of autotitration 12/6-12/11 with great compliance but inadequate control- question centrals. Most of the documentation was daytime O2 sat on O2 2 L/M Initial call taken by: Waymon Budge MD,  December 24, 2010 7:51 PM

## 2011-01-25 NOTE — Assessment & Plan Note (Signed)
Summary: 3 wk follow up - ok per Florentina Addison / cj   Primary Provider/Referring Provider:  Patty Sermons  CC:  3 week follow up visit-Breathing has improved; review PFT and test results..  History of Present Illness:  December 13, 2010- Dyspnea, OSA, CAD/ aortic stenosis, hx Sarcoid marrow/ steroids..wife here Breathing much better, able to lie flatter, using much less oxygen and not having air hunger now with CPAP/ 2 L O2 at night. He feels much better  PFT- restriction w/ some small airway obst responsive to dilator. 6 MWT- CXR- CE, interstitial edema, R pl effusion.  Autotitration- 12/6-12/11- good compliance, residual hypopneas, good oxygenation on 2 L. No hx proven pulmonary sarcoid- we need result of ACE from Dr Dagoberto Ligas, but was on steroids till recently for active sarcoid of marrow found on PET after clean CT. [name] No hx PE. Had PHTN with f/u echo pending.  Has had a recent cold, together with aches of steroid withdrawal. He will ask Dr Dagoberto Ligas about a short prednsione burst for Christmas. He is concerned that malaise, aches and head cold symptoms might reflect sarcoid flare off steroids.    Preventive Screening-Counseling & Management  Alcohol-Tobacco     Smoking Status: quit     Year Quit: 1997 Cigars  Current Medications (verified): 1)  Allopurinol 300 Mg Tabs (Allopurinol) .... Take 1 By Mouth Once Daily 2)  Aspirin 81 Mg Chew (Aspirin) .... Take 1 By Mouth Once Daily 3)  Klor-Con M20 20 Meq Cr-Tabs (Potassium Chloride Crys Cr) .... Take 1 By Mouth Once Daily 4)  Lanoxin 0.25 Mg Tabs (Digoxin) .... Take 1 By Mouth Once Daily 5)  Furosemide 80 Mg Tabs (Furosemide) .... Take 1 By Mouth Once Daily 6)  Lantus 100 Unit/ml Soln (Insulin Glargine) .... 36 Units As Directed 7)  Lopressor 50 Mg Tabs (Metoprolol Tartrate) .... Take 1 By Mouth Two Times A Day 8)  Terazosin Hcl 10 Mg Caps (Terazosin Hcl) .... Take 1 By Mouth Once Daily 9)  Ambien 10 Mg Tabs (Zolpidem Tartrate) .... Take 1 By  Mouth At Bedtime As Needed 10)  Androgel 50 Mg/5gm Gel (Testosterone) .... Apply Topical As Directed 11)  Magnesium 500 Mg Tabs (Magnesium) .... Take 1 By Mouth Two Times A Day 12)  Vitamin C 1000 Mg Tabs (Ascorbic Acid) .... Take 1 By Mouth Two Times A Day 13)  Benicar 20 Mg Tabs (Olmesartan Medoxomil) .... Take 1 By Mouth Once Daily 14)  Imdur 120 Mg Xr24h-Tab (Isosorbide Mononitrate) .... Take 1 By Mouth Once Daily  Allergies (verified): 1)  ! Pcn 2)  ! * Menocyn 3)  ! * Actos 4)  ! * Amarodione 5)  ! * Right Arm Only 6)  ! * Pna Vaccine 7)  ! * Latex  Past History:  Past Medical History: Last updated: 11/24/2010 Coronary Heart Disease- stent, CABG, RBBB Myocardial Infarction- 1995, 2002, 2006 Valvular Heart Disease- Aortic stenosis Obstructive Sleep Apnea Sarcoid left axillary bx, bone marrow, hypercalcemia. Prednisone 2008-2011.  Past Surgical History: Last updated: 11/24/2010 CABG 2006 Cataract- 2007 TUNA prostate procedure 2002  Family History: Last updated: 11/24/2010 FAmily hx of allergies, asthma,heart disease, and cancer. Father- died MI age 5 Mother- died old age 72  Social History: Last updated: 11/24/2010 Married [employer]) with Children Exsmoker-quit in 1997(cigars and pipe). 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  The patient denies shortness of breath at rest, productive cough, non-productive cough, coughing up blood, chest pain, irregular heartbeats, acid heartburn, indigestion, loss of appetite, weight change, abdominal pain, difficulty swallowing, sore throat, tooth/dental problems, headaches, sneezing, itching, ear ache, rash, change in color of mucus, and fever.    Vital Signs:  Patient profile:   70 year old male Height:      72  inches Weight:      227.25 pounds BMI:     30.93 O2 Sat:      96 % on Room air Pulse rate:   49 / minute BP sitting:   136 / 74  (right arm) Cuff size:   regular  Vitals Entered By: Reynaldo Minium CMA (December 13, 2010 12:27 PM)  O2 Flow:  Room air CC: 3 week follow up visit-Breathing has improved; review PFT and test results.   Physical Exam  Additional Exam:  General: A/Ox3; pleasant and cooperative, NAD, carried portable O2 tank, not being used. Overweight. He has an incidental cold. Room air sat 96% at rest. SKIN: no rash, lesions NODES: no lymphadenopathy HEENT: [place]/AT, EOM- WNL, Conjuctivae- clear, PERRLA, TM-WNL, Nose- thick white mucus, Throat- clear and wnl, Mallampati  III NECK: Supple w/ fair ROM, JVD- none, normal carotid impulses w/o bruits Thyroid- normal to palpation CHEST: Clear to P&A, no definite rales or rhonchi w/ quiet breathing, but wheeze with deep breath.  HEART: RRR, 2-3/ 6 aortic stenosis murmur to carotids ABDOMEN: Soft and nl; nml bowel sounds; no organomegaly or masses noted ZHY:[id number], nl pulses, edema 2+ R>L, no cords.   NEURO: Grossly intact to observation       Impression & Recommendations:  Problem # 1:  SARCOIDOSIS (ICD-135) We need result of ACE from Dr Dagoberto Ligas. I don't expect to see a flare of systemic sarcoid in a 70 yoWM. He clinically has had a recent viral head cold/ bronchitis and is gradually improving. Fot that we suggested trial of Neti pot- wife is familiar.   Problem # 2:  OBSTRUCTIVE SLEEP APNEA (ICD-327.23)  Autotitration settled on 12 cwp, but has inadequate control: residual AHI was still 25, reflecting mainly residual hypopneas, whiich may be central. Central apneas reflecting slow cerebral blood flow feedback loop, would be no surprise here.  He has left CPAP at 10. He is fully compliant with CPAP and it does help, used every night with less sleepiness in daytime.  To see if it can help his breathing as well as his apnea,  we will try BiPAP.   Problem # 3:  DYSPNEA (ICD-786.05) Dyspnea is heavily a result of left heart failure and fluid overload with interstitial edema and pleural efusion as seen on last CXR. [name] These will be managed by Dr Patty Sermons. To the extent that he has felt better, other than the cold, his cardiac management has helped him.  The pulmonary hypertension component will be reassessed on upcoming ECHO. I would assume it is secondary. The merits of a trial of therapy for this will be discussed with Dr Patty Sermons.  I think the restrictive pattern on PFT reflects cardiomegaly, pleural effusion, and obesity with hypoventilation. Imaging has not shown obvious diaphragm paresis or interstitial fibrotic disease.  We will continue home oxygen at least for sleep- he doesn't sleep soundly without it and has night terrors by wife's description. These would be manifestations of sleep related hypoxia.   Medications Added to  Medication List This Visit: 1)  Androgel 50 Mg/5gm Gel (Testosterone) .... Apply topical as directed 2)  Benicar 20 Mg Tabs (Olmesartan medoxomil) .... Take 1 by mouth once daily 3)  Imdur 120 Mg Xr24h-tab (Isosorbide mononitrate) .... Take 1 by mouth once daily 4)  Cpap 10 [facility]  .... Use with o2 2 l/m 5)  Bipap 12/ 10 [facility]  .... Use with o2 2 l/m  Other Orders: Est. Patient Level III (81191) DME Referral (DME)  Patient Instructions: 1)  Please schedule a follow-up appointment in 1 month. 2)  We are contacting [facility] to change to BiPAP 12/10, with oxygen 3)  We will contact DR Gegick's office for the result of his ACE level. 4)  OK for you to ask Dr Reece Agar for a prednisone taper. 5)  Sample Nasonex nasal steroid spray- 6)    1-2 puffs each nostril once daily at bedtime.  7)  Consider trying a Neti pot 8)  CC Dr Patty Sermons, Dr Dagoberto Ligas

## 2011-01-25 NOTE — Procedures (Signed)
Summary: Pulse Oximetry Download  Pulse Oximetry Download   Imported By: Sherian Rein 01/04/2011 08:03:48  _____________________________________________________________________  External Attachment:    Type:   Image     Comment:   External Document

## 2011-01-25 NOTE — Assessment & Plan Note (Signed)
Summary: rov 1 month ///kp   Primary Provider/Referring Provider:  Patty Sermons  CC:  1 month follow up visit-[facility].  History of Present Illness: December 13, 2010- Dyspnea, OSA, CAD/ aortic stenosis, hx Sarcoid marrow/ steroids..wife here Breathing much better, able to lie flatter, using much less oxygen and not having air hunger now with CPAP/ 2 L O2 at night. He feels much better  PFT- restriction w/ some small airway obst responsive to dilator. 6 MWT- CXR- CE, interstitial edema, R pl effusion.  Autotitration- 12/6-12/11- good compliance, residual hypopneas, good oxygenation on 2 L. No hx proven pulmonary sarcoid- we need result of ACE from Dr Dagoberto Ligas, but was on steroids till recently for active sarcoid of marrow found on PET after clean CT. [name] No hx PE. Had PHTN with f/u echo pending.  Has had a recent cold, together with aches of steroid withdrawal. He will ask Dr Dagoberto Ligas about a short prednsione burst for Christmas. He is concerned that malaise, aches and head cold symptoms might reflect sarcoid flare off steroids.   January 12, 2011- - Dyspnea, OSA, CAD/ aortic stenosis, hx Sarcoid marrow/ steroids Nurse-CC: 1 month follow up visit-[facility] ECHO- severe AS, mod regurg. CPAP is more comfortable up from 8 to 10,  with oxygen . He sleeps fairly well. [facility] did not call us to let us know that their set-up tech has been out sick for 4 weeks, but theyare to set him up Monday. Dyspnea is multifactorial. He is trying to extend his time away from steroids before facing any possible surgery. Unpleasant nightmares keep more than in past. We discussed TCAs for REM suppression, but these caused bad behavior concerns in the past.  He attributes some myalgias and dyspnea to steroid withdrawal. Off Steroids since October. He is to see Will Truslow for Rheumatology.    Preventive Screening-Counseling & Management  Alcohol-Tobacco     Smoking Status: quit     Year Quit: 1997 Cigars  Current  Medications (verified): 1)  Allopurinol 300 Mg Tabs (Allopurinol) .... Take 1 By Mouth Once Daily 2)  Aspirin 81 Mg Chew (Aspirin) .... Take 1 By Mouth Once Daily 3)  Klor-Con M20 20 Meq Cr-Tabs (Potassium Chloride Crys Cr) .... Take 1 By Mouth Once Daily 4)  Lanoxin 0.25 Mg Tabs (Digoxin) .... Take 1 By Mouth Once Daily 5)  Furosemide 80 Mg Tabs (Furosemide) .... Take 1 By Mouth Two Times A Day 6)  Lantus 100 Unit/ml Soln (Insulin Glargine) .... 20 Units As Directed 7)  Lopressor 50 Mg Tabs (Metoprolol Tartrate) .... Take 1 By Mouth Two Times A Day 8)  Terazosin Hcl 10 Mg Caps (Terazosin Hcl) .... Take 1 By Mouth Once Daily 9)  Ambien 10 Mg Tabs (Zolpidem Tartrate) .... Take 1 By Mouth At Bedtime As Needed 10)  Androgel 50 Mg/5gm Gel (Testosterone) .... Apply Topical As Directed 11)  Magnesium 200 Mg Tabs (Magnesium) .... Take 1 By Mouth Once Daily 12)  Benicar 20 Mg Tabs (Olmesartan Medoxomil) .... Take 1 By Mouth Once Daily 13)  Imdur 120 Mg Xr24h-Tab (Isosorbide Mononitrate) .... Take 1 By Mouth Once Daily 14)  Bipap 12/ 10  Williams .... Use With O2 2 L/m  Allergies (verified): 1)  ! Pcn 2)  ! * Menocyn 3)  ! * Actos 4)  ! * Amarodione 5)  ! * Right Arm Only 6)  ! * Pna Vaccine 7)  ! * Latex  Past History:  Past Medical History: Last updated: 11/24/2010 Coronary Heart  Disease- stent, CABG, RBBB Myocardial Infarction- 1995, 2002, 2006 Valvular Heart Disease- Aortic stenosis Obstructive Sleep Apnea Sarcoid left axillary bx, bone marrow, hypercalcemia. Prednisone 2008-2011.  Past Surgical History: Last updated: 11/24/2010 CABG 2006 Cataract- 2007 TUNA prostate procedure 2002  Family History: Last updated: 11/24/2010 FAmily hx of allergies, asthma,heart disease, and cancer. Father- died MI age 11 Mother- died old age 70  Social History: Last updated: 11/24/2010 Married [employer]) with Children Exsmoker-quit in 1997(cigars and pipe). 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  The patient denies productive cough, coughing up blood, chest pain, irregular heartbeats, acid heartburn, indigestion, loss of appetite, abdominal pain, difficulty swallowing, sore throat, tooth/dental problems, headaches, nasal congestion/difficulty breathing through nose, sneezing, itching, ear ache, rash, change in color of mucus, and fever.    Vital Signs:  Patient profile:   70 year old male Height:      72 inches Weight:      234.38 pounds BMI:     31.90 O2 Sat:      97 % on Room air Pulse rate:   54 / minute BP sitting:   136 / 78  (right arm) Cuff size:   large  Vitals Entered By: Reynaldo Minium CMA (January 12, 2011 3:47 PM)  O2 Flow:  Room air CC: 1 month follow up visit-[facility]   Physical Exam  Additional Exam:  General: A/Ox3; pleasant and cooperative, NAD,  not being used. Overweight. Room air sat 97% at rest. SKIN: no rash, lesions NODES: no lymphadenopathy HEENT: [place]/AT, EOM- WNL, Conjuctivae- clear, PERRLA, TM-WNL, Nose- thick white mucus, Throat- clear and wnl, Mallampati  III NECK: Supple w/ fair ROM, JVD- none, normal carotid impulses w/o bruits Thyroid- normal to palpation CHEST: Clear to P&A, no definite rales or rhonchi w/ quiet breathing, but wheeze with deep breath.  HEART: RRR, 2-3/ 6 aortic stenosis murmur to carotids ABDOMEN: overwight NGE:XBMW, nl pulses, edema 2+ R>L, no cords.   NEURO: Grossly intact to observation       Impression & Recommendations:  Problem # 1:  OBSTRUCTIVE SLEEP APNEA (ICD-327.23)  We look forward to trying BIPAP when it is delivered.   Problem # 2:  DYSPNEA (ICD-786.05) Multifactorial with obesity hypoventilation, deconditioning, aortic stenosis and hx MI. Of these,  weight loss may be most accessible.   Problem # 3:  SARCOIDOSIS (ICD-135) I doubt this is clinically active. It has not been associated with significant pulmonary disease.   Medications Added to Medication List This Visit: 1)  Furosemide 80 Mg Tabs (Furosemide) .... Take 1 by mouth two times a day 2)  Lantus 100 Unit/ml Soln (Insulin glargine) .... 20 units as directed 3)  Magnesium 200 Mg Tabs (Magnesium) .... Take 1 by mouth once daily  Other Orders: Est. Patient Level III (41324)  Patient Instructions: 1)  Please schedule a follow-up appointment in 2 months. Please call if there are any questions.

## 2011-01-25 NOTE — Assessment & Plan Note (Signed)
Summary: 6 min walk  Nurse Visit   Vital Signs:  Patient profile:   70 year old male Pulse rate:   61 / minute BP sitting:   142 / 64  Allergies: 1)  ! Pcn 2)  ! * Menocyn 3)  ! * Actos 4)  ! * Amarodione 5)  ! * Right Arm Only  Orders Added: 1)  Pulmonary Stress (6 min walk) [94620]   Six Minute Walk Test Medications taken before test(dose and time): allopurinol 300mg , BASA, klor con , lanoxin 0.25mg , furosemide 80mg , lantus, lopressor 50mg , terazosin 10mg , androderm, magnesium, vitamin c @0800 .  IBU 800mg  @ 1400. Supplemental oxygen during the test: No  Lap counter(place a tick mark inside a square for each lap completed) lap 1 complete  lap 2 complete   lap 3 complete   lap 4 complete  lap 5 complete  lap 6 complete   Baseline  BP sitting: 142/ 64 Heart rate: 61 Dyspnea ( Borg scale) 2 Fatigue (Borg scale) 4 SPO2 96 ra  End Of Test  BP sitting: 136/ 64 Heart rate: 99 Dyspnea ( Borg scale) 5 Fatigue (Borg scale) 6 SPO2 94 ra  2 Minutes post  BP sitting: 136/ 74 Heart rate: 56 SPO2 96 ra  Stopped or paused before six minutes? Yes Reason: pt stopped at d/t SOB.  o2: 94%, HR: 103.  pt resumed walking at 3:39min.  Interpretation: Number of laps  6 X 48 meters =   288 meters+ final partial lap: 25 meters =    313 meters   Total distance walked in six minutes: 313 meters  Tech ID: Boone Master CNA/MA (November 30, 2010 3:59 PM) Tech Comments pt completed test with 1 rest break d/t SOB.  o2 sats remained high at 94%ra.  at completion of test, pt had no complaints of angina, dizziness, knee or leg/calf pain.  Appended Document: 6 min walk 6 minute walk test- he had to stop for rest at 4 minutes for dyspnea, with O2 sat nadir 94% and no chest pain.

## 2011-01-31 ENCOUNTER — Ambulatory Visit (HOSPITAL_BASED_OUTPATIENT_CLINIC_OR_DEPARTMENT_OTHER)
Admission: RE | Admit: 2011-01-31 | Discharge: 2011-01-31 | Disposition: A | Discharge status: Home / Self Care | Payer: Medicare Other | Source: Ambulatory Visit | Attending: General Surgery | Admitting: General Surgery

## 2011-01-31 DIAGNOSIS — D869 Sarcoidosis, unspecified: Secondary | ICD-10-CM | POA: Insufficient documentation

## 2011-01-31 DIAGNOSIS — Z01812 Encounter for preprocedural laboratory examination: Secondary | ICD-10-CM | POA: Insufficient documentation

## 2011-01-31 DIAGNOSIS — Z452 Encounter for adjustment and management of vascular access device: Secondary | ICD-10-CM | POA: Insufficient documentation

## 2011-01-31 LAB — GLUCOSE, CAPILLARY: Glucose-Capillary: 147 mg/dL — ABNORMAL HIGH (ref 70–99)

## 2011-01-31 NOTE — Letter (Signed)
Summary: Stacey Drain MD  Stacey Drain MD   Imported By: Sherian Rein 01/26/2011 12:08:43  _____________________________________________________________________  External Attachment:    Type:   Image     Comment:   External Document

## 2011-01-31 NOTE — Letter (Signed)
Summary: Corrin Esses MD  Corrin Mcmichen MD   Imported By: Sherian Rein 01/26/2011 14:40:45  _____________________________________________________________________  External Attachment:    Type:   Image     Comment:   External Document

## 2011-01-31 NOTE — Progress Notes (Signed)
Summary: tina with [facility] re Bipap  Phone Note Other Incoming   Caller: tina with [facility] Summary of Call: Inetta Fermo with [facility] phoned stated that she does have Mr. Snodgrass scheduled for tomorrow to be set up with BiPap and she wants to aplolgize for the wait. Inetta Fermo can be reached [phone] Initial call taken by: Vedia Coffer,  January 17, 2011 5:01 PM  Follow-up for Phone Call        Returned call to Inetta Fermo at [facility] and she stated that there was a delay in setting pt up b/c the RT had a death in the family and then was out the next week sick. Deatra James that if this occurs again with one of our patients ( a delay in getting them set up with the equipment) please call us, b/c the return visit is based on the order and the completion of the order. This pt drove from [place] area to this appt and was unable to provide any information on the bipap and how he was doing b/c he had not been set up on this equipment. This was a wasted trip for the patient and time on the physician's schedule for a unnecessary ov. Inetta Fermo stated that she was sorry and that Dr. Jimmey Ralph has been set up on 01/18/11 and that she would be more mindful and call if there is any delay in setting up any of our patients in the future. Rhonda Cobb  January 23, 2011 3:35 PM

## 2011-02-03 LAB — CATH TIP CULTURE

## 2011-02-08 NOTE — Letter (Signed)
Summary: [facility]  [facility]   Imported By: Lennie Odor 01/30/2011 11:49:31  _____________________________________________________________________  External Attachment:    Type:   Image     Comment:   External Document

## 2011-02-11 NOTE — Op Note (Signed)
  NAMECLAIBORNE, STROBLE               ACCOUNT NO.:  [id number]  MEDICAL RECORD NO.:  [id number]           PATIENT TYPE:  LOCATION:                                 FACILITY:  PHYSICIAN:  Angelia Mould. Derrell Lolling, M.D.DATE OF BIRTH:  13-Aug-1941  DATE OF PROCEDURE:  01/31/2011 DATE OF DISCHARGE:                              OPERATIVE REPORT   PREOPERATIVE DIAGNOSIS:  Sarcoidosis.  POSTOPERATIVE DIAGNOSIS:  Sarcoidosis.  OPERATION PERFORMED:  Removal of Port-A-Cath.  SURGEON:  Angelia Mould. Derrell Lolling, MD  OPERATIVE INDICATIONS:  This is a 70 year old gentleman who had a Port-A- Cath inserted in 2008 when he was clinically diagnosed with lymphoma. He had further workup and lymphoma was actually ruled out after bone marrow biopsy and lymph node biopsy and his diagnosis was changed to sarcoidosis.  He has been on steroids for the past 3 years and these were discontinued about 3 months ago.  He has had problems with weakness and failure to thrive.  His rheumatologist, Dr. Kellie Simmering, has asked that we remove the Port-A-Cath and culture the tip just incase there is some type of autoimmune or infectious problem that might be causing his malaise.  Examination of the chest reveals Port-A-Cath palpable in the right infraclavicular area.  The skin is healthy.  There is no sign of any cellulitis or any fluid collection and it is nontender.  OPERATIVE TECHNIQUE:  The patient was brought to the operating room, placed supine on the operating table.  He was monitored and sedated by the Anesthesia Department.  We clipped the hair overlying the incision. We prepped and draped the right chest.  The patient was identified as correct patient and correct procedure and correct site.  A 0.5% Marcaine with epinephrine was used as a  local infiltration anesthetic. Transverse incision was made in the right infraclavicular area, through the previous scar.  Dissection was carried down to the subcutaneous tissue and then I  incised the capsule around the port.  The port was slowly mobilized and elevated.  I cut and removed the Prolene sutures. I then removed the port and the catheter intact.  I cut off 1 cm of the tip of the catheter and put it in a culture tube and sent it to the lab for routine culture and sensitivity.  There was a little bit of backbleeding but not much.  I closed the wound in layers with interrupted sutures of 3-0 Vicryl and the skin was closed with running subcuticular suture of 4-0 Monocryl and Dermabond.  There was absolutely no bleeding or swelling at the completion of the case.  The patient tolerated the procedure well and was taken to the recovery room in stable condition.  Estimated blood loss was about 10-15 mL.  Complications none.  Sponge, needle, and instrument counts were correct.     Angelia Mould. Derrell Lolling, M.D.     HMI/MEDQ  D:  01/31/2011  T:  02/01/2011  Job:  [id number]  cc:   Aundra Dubin, M.D. Cassell Clement, M.D.  Electronically Signed by Claud Kelp M.D. on 02/11/2011 07:00:26 PM

## 2011-02-20 ENCOUNTER — Encounter: Payer: Self-pay | Admitting: Internal Medicine

## 2011-03-13 NOTE — Letter (Signed)
Summary: Stacey Drain MD  Stacey Drain MD   Imported By: Sherian Rein 03/07/2011 13:14:21  _____________________________________________________________________  External Attachment:    Type:   Image     Comment:   External Document

## 2011-03-23 ENCOUNTER — Encounter: Payer: Self-pay | Admitting: Internal Medicine

## 2011-03-27 ENCOUNTER — Ambulatory Visit (INDEPENDENT_AMBULATORY_CARE_PROVIDER_SITE_OTHER): Payer: Medicare Other | Admitting: Internal Medicine

## 2011-03-27 ENCOUNTER — Encounter: Payer: Self-pay | Admitting: Internal Medicine

## 2011-03-27 VITALS — BP 156/70 | HR 56 | Ht 71.0 in | Wt 228.8 lb

## 2011-03-27 DIAGNOSIS — D869 Sarcoidosis, unspecified: Secondary | ICD-10-CM

## 2011-03-27 DIAGNOSIS — I359 Nonrheumatic aortic valve disorder, unspecified: Secondary | ICD-10-CM

## 2011-03-27 DIAGNOSIS — R0602 Shortness of breath: Secondary | ICD-10-CM

## 2011-03-27 DIAGNOSIS — G4733 Obstructive sleep apnea (adult) (pediatric): Secondary | ICD-10-CM

## 2011-03-27 NOTE — Assessment & Plan Note (Addendum)
Good compliance and control on BIPAP Air hunger has stopped and wife confirms he now sleeps restrfully

## 2011-03-27 NOTE — Assessment & Plan Note (Signed)
Ascites/ pleural effusion are related. They may track to his valvular heart disease and renal insufficiey I suggested, depending on the result of his abd ultrasound done at [facility], that he consider getting an ultrasound guided thoracentesis or paracentesis for cytology and chemisty.

## 2011-03-27 NOTE — Progress Notes (Signed)
  Subjective:    Patient ID: [name], male    DOB: 1941-03-29, 70 y.o.   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  [name] is not worse, but no better. Known ascites and right pleural effusion have appeared since prednisone was stopped in Fall of 2011.[name] Last creatinine 2.3 (uncertain why this is worse). Now off insulin, since off prednisone. He is concerned that the ascitic/ pleural fluid may be indicative of some other disease process. Wife feels his dyspnea is mainly consistent with his fluid retention.    Review of Systems See HPI Constitutional:   No-  weight loss, night sweats,  Fevers, chills, fatigue, HEENT:   No headaches,  Difficulty swallowing,  Tooth/dental problems,  Sore throat,                No sneezing, itching, ear ache, nasal congestion, post nasal drip,   CV:  No chest pain, Orthopnea, PND, , dizziness, palpitations  GI  No heartburn, indigestion, abdominal pain, nausea, vomiting, diarrhea, change in bowel habits, loss of appetite  Resp:.  No excess mucus, no productive cough,  No non-productive cough,  No coughing up of blood.  No change in color of mucus.  No wheezing.   Skin: no rash or lesions.  GU: no dysuria, change in color of urine, no urgency or frequency.  No flank pain.  MS:  No joint pain or swelling.  No decreased range of motion.  No back pain.  Psych:  No change in mood or affect. No depression or anxiety.  No memory loss.     Objective:   Physical Exam General- Alert, Oriented, Affect-appropriate, Distress- none acute    Abdominal obesity/ ascites, He appears to have lost considerable adipose weight  Skin- rash-none, lesions- none, excoriation- none  Lymphadenopathy- none  Head- atraumatic  Eyes- Gross vision intact, PERRLA, conjunctivae clear, secretions  Ears-  Normal-Hearing, canals, Tm L  ,   R ,  Nose- Clear,  No-Septal dev, mucus, polyps, erosion, perforation   Throat- Mallampati II , mucosa clear , drainage- none, tonsils- atrophic  Neck- flexible , trachea midline, no stridor , thyroid nl, carotid no bruit  Chest - symmetrical excursion , unlabored     Heart/CV- RRR , no murmur , no gallop  , no rub, nl s1 s2                     - JVD- none , edema- none, stasis changes- none, varices- none     Lung- clear to P&A, wheeze- none, cough- none , dullness-lower third of right back, no rub, rub- none     Chest wall-  Abd- tender-no, distended-yes, bowel sounds-present, HSM-   Br/ Gen/ Rectal- Not done, not indicated  Extrem- cyanosis- none, clubbing, none, atrophy- none, strength- nl  Neuro- grossly intact to observation        Assessment & Plan:

## 2011-03-27 NOTE — Assessment & Plan Note (Addendum)
Fluid retention is worse since prednisone was stopped in October. Original dx of Sarcoid was not made until he had a PET showing diffuse bone marrow invovement We will repeat a PET with question of whether his sarcoid has reactivated off prednisone. This would be an unusual sarcoid pattern in an older white male, but it was atypical to begin with. Question is whether the third spacing of fluid into pleural and peritoneal spaces, and secondary renal insufficiency from intravascular volume loss, might be related to the sarcoid and to cessation of steroid therapy as suggested by timing.

## 2011-03-27 NOTE — Patient Instructions (Addendum)
Consider with Dr Foy Guadalajara whether to consider thoracentesis or paracentesis.     Order- Schedule PET scan dx Sarcoid, weight loss, ascites

## 2011-04-01 ENCOUNTER — Encounter: Payer: Self-pay | Admitting: Internal Medicine

## 2011-04-04 ENCOUNTER — Encounter (HOSPITAL_COMMUNITY): Payer: Self-pay

## 2011-04-04 ENCOUNTER — Telehealth: Payer: Self-pay | Admitting: Internal Medicine

## 2011-04-04 ENCOUNTER — Encounter (HOSPITAL_COMMUNITY)
Admission: RE | Admit: 2011-04-04 | Discharge: 2011-04-04 | Disposition: A | Discharge status: Home / Self Care | Payer: Medicare Other | Source: Ambulatory Visit | Attending: Internal Medicine | Admitting: Internal Medicine

## 2011-04-04 DIAGNOSIS — R5381 Other malaise: Secondary | ICD-10-CM | POA: Insufficient documentation

## 2011-04-04 DIAGNOSIS — R944 Abnormal results of kidney function studies: Secondary | ICD-10-CM | POA: Insufficient documentation

## 2011-04-04 DIAGNOSIS — N400 Enlarged prostate without lower urinary tract symptoms: Secondary | ICD-10-CM | POA: Insufficient documentation

## 2011-04-04 DIAGNOSIS — R188 Other ascites: Secondary | ICD-10-CM | POA: Insufficient documentation

## 2011-04-04 DIAGNOSIS — K8020 Calculus of gallbladder without cholecystitis without obstruction: Secondary | ICD-10-CM | POA: Insufficient documentation

## 2011-04-04 DIAGNOSIS — I700 Atherosclerosis of aorta: Secondary | ICD-10-CM | POA: Insufficient documentation

## 2011-04-04 DIAGNOSIS — R634 Abnormal weight loss: Secondary | ICD-10-CM | POA: Insufficient documentation

## 2011-04-04 DIAGNOSIS — R197 Diarrhea, unspecified: Secondary | ICD-10-CM | POA: Insufficient documentation

## 2011-04-04 DIAGNOSIS — D869 Sarcoidosis, unspecified: Secondary | ICD-10-CM | POA: Insufficient documentation

## 2011-04-04 DIAGNOSIS — I517 Cardiomegaly: Secondary | ICD-10-CM | POA: Insufficient documentation

## 2011-04-04 DIAGNOSIS — J90 Pleural effusion, not elsewhere classified: Secondary | ICD-10-CM | POA: Insufficient documentation

## 2011-04-04 DIAGNOSIS — Q619 Cystic kidney disease, unspecified: Secondary | ICD-10-CM | POA: Insufficient documentation

## 2011-04-04 DIAGNOSIS — R5383 Other fatigue: Secondary | ICD-10-CM | POA: Insufficient documentation

## 2011-04-04 MED ORDER — FLUDEOXYGLUCOSE F - 18 (FDG) INJECTION
18.5000 | Freq: Once | INTRAVENOUS | Status: AC | PRN
  Administered 2011-04-04: 18.5 via INTRAVENOUS

## 2011-04-04 NOTE — Telephone Encounter (Signed)
Spoke w/ pt and he states he had a PET scan done today and pt is wanting results. I advised pt Dr. Maple Hudson was out of the office this afternoon. Pt verbalized understanding. Pt also requests these results be sent to Dr. Ronny Flurry and Dr. Marinda Elk. Please advise Dr. Maple Hudson. Thanks   Carver Fila, CMA

## 2011-04-05 NOTE — Telephone Encounter (Signed)
OK to send PET report to DR Marinda Elk and to Dr Ronny Flurry

## 2011-04-05 NOTE — Telephone Encounter (Signed)
Spoke w/ pt and advised him of PET results. Pt verbalized understanding and states he will get a copy from Dr. Foy Guadalajara tomorrow. Report was sent to Dr. Foy Guadalajara and Dr. Patty Sermons.

## 2011-04-06 ENCOUNTER — Encounter: Payer: Self-pay | Admitting: Cardiology

## 2011-04-09 ENCOUNTER — Encounter: Payer: Self-pay | Admitting: Cardiology

## 2011-04-19 ENCOUNTER — Other Ambulatory Visit: Payer: Self-pay | Admitting: *Deleted

## 2011-04-19 DIAGNOSIS — I359 Nonrheumatic aortic valve disorder, unspecified: Secondary | ICD-10-CM

## 2011-04-19 NOTE — Progress Notes (Signed)
Dr. Deborah Chalk spoke with Dr. Jimmey Ralph and they decided to go ahead with appt. With Dr. Laneta Simmers and sch echo per Dr. Deborah Chalk before appt with Dr. Patty Sermons

## 2011-04-20 ENCOUNTER — Ambulatory Visit (HOSPITAL_COMMUNITY): Payer: Medicare Other | Attending: Cardiology | Admitting: Radiology

## 2011-04-20 ENCOUNTER — Telehealth: Payer: Self-pay | Admitting: Cardiology

## 2011-04-20 DIAGNOSIS — I2510 Atherosclerotic heart disease of native coronary artery without angina pectoris: Secondary | ICD-10-CM

## 2011-04-20 DIAGNOSIS — I359 Nonrheumatic aortic valve disorder, unspecified: Secondary | ICD-10-CM

## 2011-04-20 NOTE — Telephone Encounter (Signed)
Advised would call when results received

## 2011-04-20 NOTE — Telephone Encounter (Signed)
Pt had a 2d echo done today and wanted to know the results He said he wanted Dr Deborah Chalk or Dr Patty Sermons to call him

## 2011-04-26 ENCOUNTER — Ambulatory Visit
Admission: RE | Admit: 2011-04-26 | Discharge: 2011-04-26 | Disposition: A | Discharge status: Home / Self Care | Payer: Medicare Other | Source: Ambulatory Visit | Attending: Cardiology | Admitting: Cardiology

## 2011-04-26 ENCOUNTER — Ambulatory Visit: Payer: Medicare Other | Admitting: Cardiology

## 2011-04-26 ENCOUNTER — Encounter: Payer: Self-pay | Admitting: Cardiology

## 2011-04-26 ENCOUNTER — Ambulatory Visit (INDEPENDENT_AMBULATORY_CARE_PROVIDER_SITE_OTHER): Payer: Medicare Other | Admitting: Cardiology

## 2011-04-26 VITALS — BP 132/50 | HR 64 | Wt 206.0 lb

## 2011-04-26 DIAGNOSIS — I350 Nonrheumatic aortic (valve) stenosis: Secondary | ICD-10-CM

## 2011-04-26 DIAGNOSIS — Z951 Presence of aortocoronary bypass graft: Secondary | ICD-10-CM

## 2011-04-26 DIAGNOSIS — M109 Gout, unspecified: Secondary | ICD-10-CM | POA: Insufficient documentation

## 2011-04-26 DIAGNOSIS — I119 Hypertensive heart disease without heart failure: Secondary | ICD-10-CM

## 2011-04-26 DIAGNOSIS — I10 Essential (primary) hypertension: Secondary | ICD-10-CM | POA: Insufficient documentation

## 2011-04-26 DIAGNOSIS — Z9889 Other specified postprocedural states: Secondary | ICD-10-CM

## 2011-04-26 DIAGNOSIS — I359 Nonrheumatic aortic valve disorder, unspecified: Secondary | ICD-10-CM

## 2011-04-26 DIAGNOSIS — E119 Type 2 diabetes mellitus without complications: Secondary | ICD-10-CM | POA: Insufficient documentation

## 2011-04-26 DIAGNOSIS — N400 Enlarged prostate without lower urinary tract symptoms: Secondary | ICD-10-CM | POA: Insufficient documentation

## 2011-04-26 NOTE — Assessment & Plan Note (Signed)
The patient's diabetes had been managed by [name].  He will now be seen by Dr. Sharl Ma.  The patient's medical Dr. Is Dr. Foy Guadalajara.

## 2011-04-26 NOTE — Assessment & Plan Note (Addendum)
The patient has had no definite recurrent angina pectoris.  He does complain of constant chest soreness which he attributes to his shortness of breath and possibly to residual effects of his pulmonary sarcoidosis.His last nuclear stress test was an adenosine study on 10/20/07 which showed old inferior wall scar with minimal peri-infarct ischemia and his ejection fraction was 56%.  It was felt to be a low-risk nuclear study

## 2011-04-26 NOTE — Progress Notes (Signed)
[name] Date of Birth:  1941/01/13 [facility] / [facility] [address]   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  He's had a long history of known aortic stenosis.  Recently his symptoms have worsened.  An echocardiogram done on 04/20/11 now shows findings consistent with critical aortic stenosis with a mean gradient of 67 and a peak gradient of 120.  He also has moderate pulmonary hypertension with a PA pressure of 51 the patient has a history of known ischemic heart disease and had coronary artery bypass graft surgery by Dr. Laneta Simmers on 09/07/05.  He has not been experiencing definite recurrent angina but has had some chest discomfort which he attributes to the difficulty breathing because of his shortness of breath and compensated congestive heart failure.  He had a chest x-ray today at [facility] imaging which showed stable cardiomegaly and new bilateral pleural effusions larger on the right.  Current Outpatient Prescriptions  Medication Sig Dispense Refill  . allopurinol (ZYLOPRIM) 300 MG tablet Take 300 mg by mouth daily.        [name] aspirin 81 MG tablet Take 81 mg by mouth daily.        . diazepam (VALIUM) 5 MG tablet Take 5 mg by mouth every 6 (six) hours as needed. Taking as needed       . digoxin (LANOXIN) 0.25 MG tablet Once a day       . furosemide (LASIX) 20 MG tablet Take 80 mg by mouth 2 (two) times daily.       . isosorbide mononitrate (IMDUR) 120 MG 24 hr tablet Take 120 mg by mouth daily.        . metoprolol (LOPRESSOR) 50 MG tablet 1 twice a day       . spironolactone (ALDACTONE) 25 MG tablet 25 mg daily.       [name] terazosin (HYTRIN) 10 MG capsule Once a day       . zolpidem (AMBIEN) 10 MG tablet Take 10 mg by mouth at bedtime as needed.       . insulin glargine (LANTUS) 100 UNIT/ML injection 20 units  as directed       . Magnesium 200 MG TABS Take by mouth. Once a day       . olmesartan (BENICAR) 20 MG tablet Once a day       . potassium chloride (KLOR-CON) 20 MEQ packet Once a day       . ramipril (ALTACE) 10 MG tablet Take 10 mg by mouth daily.        [name] testosterone (ANDROGEL) 50 MG/5GM GEL Apply as directed         Allergies  Allergen Reactions  . Actos (Pioglitazone Hydrochloride)   . Altace   . Amiodarone   . Indocin   . Latex   . Lotensin     ?  . Norvasc (Amlodipine Besylate)   . Orudis (Ketoprofen)   . Penicillins   . Pioglitazone     REACTION: causes CHF and edema  . Talwin   . Zoloft     Patient Active Problem List  Diagnoses  . Sarcoidosis  . OBSTRUCTIVE SLEEP APNEA  . MYOCARDIAL INFARCTION  . CORONARY HEART DISEASE  . AORTIC STENOSIS  . VALVULAR HEART DISEASE  . DYSPNEA  .  Hx of CABG  . Diabetes mellitus  . Gout  . BPH (benign prostatic hyperplasia)  . Benign hypertensive heart disease without heart failure    History  Smoking status  . Former Smoker  . Types: Cigarettes, Pipe  . Quit date: 12/25/1995  Smokeless tobacco  . Not on file    History  Alcohol Use: Not on file    Family History  Problem Relation Age of Onset  . Heart attack Father   . Allergies    . Asthma    . Heart disease    . Cancer      Review of Systems: Constitutional: no fever chills diaphoresis or fatigue or change in weight.  Head and neck: no hearing loss, no epistaxis, no photophobia or visual disturbance. Respiratory: No cough.Patient does have obstructive sleep apnea and is followed Dr. Levonne Spiller young Cardiovascular: No Palpitations. Gastrointestinal: No abdominal distention, no abdominal pain, no change in bowel habits hematochezia or melena. Genitourinary: No dysuria, no frequency, no urgency, no nocturia. Musculoskeletal:No arthralgias, no back pain, no gait disturbance or myalgias. Neurological: No dizziness, no headaches, no numbness, no seizures, no  syncope, no weakness, no tremors. Hematologic: No lymphadenopathy, no easy bruising. Psychiatric: No confusion, no hallucinations, no sleep disturbance.    Physical Exam: Filed Vitals:   04/26/11 1416  BP: 132/50  Pulse: 64  The general appearance is a middle-aged gentleman in no acute distress.  He has lost a lot of weight although he is still heavy.  He estimates he has lost about 100 pounds since his initial open heart surgery 6 years ago.Pupils equal and reactive.   Extraocular Movements are full.  There is no scleral icterus.  The mouth and pharynx are normal.  The neck is supple.  The carotids reveal no bruits.  The jugular venous pressure is normal.  The thyroid is not enlarged.  There is no lymphadenopathy.  The chest reveals diminished breath sounds at the bases consistent with pleural effusion.  The heart reveals a harsh grade 3/6 systolic murmur without thrill And it radiates to the carotids bilaterally.No diastolic murmur gallop or rub.  The abdomen is obese and the liver was palpable.  Maybe mild ascites.  The abdomen is not tense.  Bowel sounds are normal extremities show trace peripheral edema.  Pedal pulses are good.Strength is normal and symmetrical in all extremities.  There is no lateralizing weakness.  There are no sensory deficits.The skin is warm and dry.  There is no rash.   Assessment / Plan: Proceed with plans for aortic valve replacement.  He has an appointment to see Dr. Laneta Simmers on May 8.  We'll asked Dr. Peter Swaziland to perform cardiac catheterization in conjunction with Dr. Sharee Pimple surgical schedule.Continue present diuretic regimen.  The patient will also arrange to see his diabetologist soon.

## 2011-04-26 NOTE — Assessment & Plan Note (Signed)
The patient has had a long history of aortic stenosis.  Over the past several months his symptoms of congestive heart failure and exertional dyspnea had increased.  He has developed abdominal ascites.  He's had marked shortness of breath with any activity.He had CT scan of the chest and abdomen in January which did not show any evidence of malignancy.  He had been somewhat concerned because of significant weight loss.  The patient also has a past history of sarcoidosis which is felt presently to be in active.  The CT scan of the chest and abdomen showed mild ascites and showed bilateral small pleural effusions.  The patient had an echocardiogram on 04/20/11 which showed critical aortic stenosis with a peak systolic gradient of 120 and a mean systolic gradient of 68.  He also had moderate aortic regurgitation.  The mitral valve showed mild mitral regurgitation and the left atrium was moderately dilated and the tricuspid valve showed severe regurgitation.  Left ventricular ejection fraction was 55-60% and there were no wall motion abnormalities.  He does have mild LVH.  The pulmonary artery pressure was elevated at 51.  There is a small pericardial effusion and a large right pleural effusion.  Because of the progression of his symptoms and progression of his aortic stenosis it is felt time that he should have aortic valve replacement.  The patient already has an appointment to see Dr. Rexanne Mano on May 8.  Dr. Laneta Simmers performed the patient's CABG x5 on 09/07/05.We will want to arrange for   diagnostic cardiac catheterization with Dr. Peter Swaziland  To coordinate with Dr. Edwin Cap surgical schedule.

## 2011-05-01 ENCOUNTER — Encounter (INDEPENDENT_AMBULATORY_CARE_PROVIDER_SITE_OTHER): Payer: Medicare Other | Admitting: Surgery

## 2011-05-01 ENCOUNTER — Telehealth: Payer: Self-pay | Admitting: *Deleted

## 2011-05-01 DIAGNOSIS — I359 Nonrheumatic aortic valve disorder, unspecified: Secondary | ICD-10-CM

## 2011-05-01 DIAGNOSIS — I079 Rheumatic tricuspid valve disease, unspecified: Secondary | ICD-10-CM

## 2011-05-01 DIAGNOSIS — I2510 Atherosclerotic heart disease of native coronary artery without angina pectoris: Secondary | ICD-10-CM

## 2011-05-01 NOTE — Telephone Encounter (Signed)
L/M to call back regarding CXR report

## 2011-05-02 NOTE — Consult Note (Signed)
NEW PATIENT CONSULTATION  [name], [name] DOB:  [birth year]                                        May 01, 2011 CHART #:  [id number]  REASON FOR CONSULTATION:  Critical aortic stenosis and moderate aortic insufficiency with progressive exertional dyspnea and biventricular heart failure.  CLINICAL HISTORY:  I was asked by Dr. Patty Sermons to evaluate the patient for consideration of redo sternotomy for aortic valve replacement.  He is a 70 year old gentleman who I operated on in September 2006, performing coronary artery bypass graft surgery x5.  He had a left internal mammary graft to the LAD with saphenous vein graft to the diagonal branch, the posterior descending coronary artery, and a sequential vein graft to the first and second obtuse marginal branches. His ejection fraction at that time was about 50%.  He had a slow postoperative recovery.  He had no history of aortic stenosis at that time.  He reports that he was subsequently diagnosed with hypercalcemia a few years ago and ultimately had a bone marrow biopsy and axillary lymph node biopsy revealing that he had sarcoidosis.  He was treated with prednisone for about 3 years which he said was discontinued in October 2011.  He was also diagnosed with obstructive sleep apnea and was started on CPAP with improvement in his breathing.  Over the past several months, he notes progressive exertional dyspnea as well as development of abdominal ascites.  He has gotten to the point where he has significant shortness of breath with any activity.  He had a echocardiogram done on April 20, 2011, which showed critical aortic stenosis with a heavily calcified aortic valve with a peak gradient of 120 mmHg and a mean gradient of 68 mmHg with a valve area measured at 0.8 cm2.  He had moderate aortic insufficiency.  Left ventricular ejection fraction was about 55-60% with mild LVH and essentially normal systolic function.  There  were no regional wall motion abnormalities. Examination of his other heart valves also showed that he had moderate pulmonary regurgitation and severe tricuspid regurgitation with pulmonary hypertension and a pulmonary peak pressure of 51 mmHg.  There is a small pericardial effusion and a large right pleural effusion.  The left atrium was moderately dilated.  There was mild mitral regurgitation.  Right ventricular systolic function was mildly reduced. His body surface area at that time was measured at 2.18 m2 with a weight of 209.6 pounds.  REVIEW OF SYSTEMS:  GENERAL:  He denies any fever or chills.  He has had significant fatigue and his wife said he sleeps about 18 hours a day. He has had significant weight loss over the past couple years of about 100 pounds.  He has had loss of appetite.  He said he had a CT scan of the chest and abdomen in January that did not show any evidence of malignancy and this was done due to significant weight loss.  EYES: Negative. ENT:  Negative. ENDOCRINE:  He does have adult-onset diabetes which has been managed by Dr. Sharl Ma.  He has been diabetic since age 61.  He denies hypothyroidism. CARDIOVASCULAR:  He denies any specific chest pain, but has had some chest tightness and pressure which he feels is probably secondary to his breathing.  He has orthopnea and exertional dyspnea.  He does report palpitations and has a history of atrial  fibrillation in the past. RESPIRATORY:  He denies cough or sputum production.  He does have some wheezing.  He has been using BiPAP at night since June 2011 as well as oxygen.  He has been followed by Dr. Jetty Duhamel. GI:  He denies nausea or vomiting.  He denies melena and bright red blood per rectum. GU:  He denies dysuria and hematuria.  He has a history of creatinine around 2.4. VASCULAR:  He denies claudication and phlebitis. NEUROLOGICAL:  He denies any focal weakness or numbness.  He denies dizziness and syncope.   He has never had a TIA or stroke. MUSCULOSKELETAL:  He does have significant arthritis and myalgias. PSYCHIATRIC:  He has a history of anxiety. HEMATOLOGICAL:  He denies any history of bleeding disorders or easy bleeding.  ALLERGIES:  To penicillin, Minocin, codeine, Lasix, Actos, amiodarone.  MEDICATIONS: 1. Allopurinol 300 mg daily. 2. Aspirin 81 mg daily. 3. Lanoxin 0.25 mg daily. 4. Lasix 80 mg b.i.d. 5. Lopressor 50 mg b.i.d. 6. Hytrin 10 mg daily. 7. Ambien 10 mg nightly. 8. Imdur 120 mg daily. 9. Aldactone 25 mg daily. 10.Valium 5 mg nightly p.r.n.  PAST MEDICAL HISTORY:  Significant for diabetes, hypertension, congestive heart failure, and previous myocardial infarction.  He is status post coronary artery bypass graft surgery on September 07, 2005. He has a new diagnosis of critical aortic stenosis and moderate aortic insufficiency.  Recent diagnosis of severe tricuspid regurgitation and moderate pulmonary insufficiency.  He has a history of BPH.  History of gout.  History of sleep apnea.  History of sarcoidosis as mentioned above.  He is status post cataract surgery in the past.  FAMILY HISTORY:  His father died of myocardial infarction at age 4. Mother died of old age at 17.  SOCIAL HISTORY:  He is married and lives with wife.  He is a retired Development worker, community.  He has 2 children who are living and 2 that are deceased. He quit smoking in 1994-05-29 and only smoked a pipe.  He denies alcohol abuse.  PHYSICAL EXAMINATION:  Vital Signs:  Blood pressure 162/62, pulse is 52 and regular, respiratory rate is 16 and unlabored.  Oxygen saturation on room air is 96%.  General:  He is a elderly chronically ill-appearing gentleman in no distress.  HEENT:  Normocephalic and atraumatic.  Pupils are equal and reactive to light and accommodation.  Extraocular muscles are intact.  Throat:  Clear.  Neck:  Normal carotid pulses bilaterally. There is a transmitted murmur to both sides of his  neck.  There is no adenopathy or thyromegaly.  Cardiac:  Regular rate and rhythm with a grade 3/6 systolic murmur of aortic stenosis and a 2/6 diastolic murmur of aortic insufficiency.  There is a well-healed sternotomy incision scar.  Lungs:  Clear.  Abdomen:  Active bowel sounds.  Abdomen is soft, protuberant and nontender.  There are no palpable masses or organomegaly.  Extremities:  Mild bilateral ankle edema.  He has discoloration of both lower legs.  There is a right leg vein harvest incision.  There are some varicose veins in the left lower leg.  Pedal pulses are palpable bilaterally.  Skin:  Warm and dry.  Neurologic: Alert and oriented x3.  Motor and sensory exams are grossly normal.  IMPRESSION:  The patient has critical aortic stenosis and moderate aortic insufficiency as well as moderate pulmonary regurgitation and severe tricuspid insufficiency and mild right ventricular dysfunction noted by echocardiogram.  He is quite symptomatic with symptoms and  signs of biventricular heart failure.  I agree that aortic valve replacement is the best treatment for him.  He will also require tricuspid valve repair with annuloplasty ring.  Given his pulmonary hypertension with moderate pulmonary valve insufficiency and right ventricular dysfunction, I do not think his tricuspid regurgitation will improve after aortic valve replacement alone and he would continue to have significant right heart failure if his tricuspid valve was not repaired.  He will need cardiac catheterization preoperatively to reassess his coronary arteries and grafts.  It is possible that he could require coronary bypass.  He is at increased operative risk due to his age, chronic debilitation and deconditioning as well as the redo nature of his surgery and significant renal dysfunction with elevated creatinine.  Despite all this, I think he is a candidate for open aortic valve surgery and I do not think TAVI would be  the best option for him. I would plan to use a tissue valve given his age and comorbid factors. I discussed the possibility of requiring coronary artery bypass surgery in addition to aortic and tricuspid valve surgery.  This would depend on his catheterization.  I also discussed the possibility that we may need to replace part of her extend, one or more of his vein bypass grafts since we have to perform an aortotomy to expose the aortic valve and these grafts may be in the way.  He will require a cardiac catheterization preoperatively, and I think this should be done as an outpatient with a 1-week delay in doing surgery after that.  He has significant renal dysfunction and we can minimize his risk of renal failure if he has at least 5 days after the catheterization before performing surgery.  I think he is still at high risk for kidney failure given his creatinine of 2.4 and the magnitude of his surgery.  I will discuss this with Dr. Swaziland, so this catheterization can be arranged. I am going to be working in [employer] over the next 4 weeks and we will try to get his catheterization done during that time, so we can proceed with his surgery as soon as I return.  I discussed the benefits and risks of surgery including, but not limited to bleeding, blood transfusion, infection, stroke, myocardial infarction, graft failure, heart block requiring permanent pacemaker, organ dysfunction including renal failure requiring dialysis, and death.  He understands all this and agrees to proceed with surgery.  I told him I would talk with Dr. Swaziland and have Dr. Elvis Coil office call and schedule the catheterization.  We will schedule his surgery after that.  Evelene Croon, M.D. Electronically Signed  BB/MEDQ  D:  05/01/2011  T:  05/02/2011  Job:  [id number]  cc:   [name] L. Foy [place], M.D. Tonita Cong, M.D. Clinton D. Maple [place], MD, FCCP, FACP

## 2011-05-03 ENCOUNTER — Other Ambulatory Visit: Payer: Self-pay | Admitting: *Deleted

## 2011-05-03 DIAGNOSIS — E559 Vitamin D deficiency, unspecified: Secondary | ICD-10-CM

## 2011-05-03 DIAGNOSIS — D689 Coagulation defect, unspecified: Secondary | ICD-10-CM

## 2011-05-03 DIAGNOSIS — Z01818 Encounter for other preprocedural examination: Secondary | ICD-10-CM

## 2011-05-04 ENCOUNTER — Other Ambulatory Visit (INDEPENDENT_AMBULATORY_CARE_PROVIDER_SITE_OTHER): Payer: Medicare Other | Admitting: *Deleted

## 2011-05-04 ENCOUNTER — Encounter: Payer: Self-pay | Admitting: Internal Medicine

## 2011-05-04 ENCOUNTER — Ambulatory Visit (INDEPENDENT_AMBULATORY_CARE_PROVIDER_SITE_OTHER): Payer: Medicare Other | Admitting: Internal Medicine

## 2011-05-04 VITALS — BP 124/60 | HR 44 | Ht 71.0 in | Wt 213.8 lb

## 2011-05-04 DIAGNOSIS — D869 Sarcoidosis, unspecified: Secondary | ICD-10-CM

## 2011-05-04 DIAGNOSIS — D689 Coagulation defect, unspecified: Secondary | ICD-10-CM

## 2011-05-04 DIAGNOSIS — R0602 Shortness of breath: Secondary | ICD-10-CM

## 2011-05-04 DIAGNOSIS — Z01818 Encounter for other preprocedural examination: Secondary | ICD-10-CM

## 2011-05-04 DIAGNOSIS — G4733 Obstructive sleep apnea (adult) (pediatric): Secondary | ICD-10-CM

## 2011-05-04 LAB — CBC WITH DIFFERENTIAL/PLATELET
Eosinophils Relative: 1.8 % (ref 0.0–5.0)
HCT: 39.2 % (ref 39.0–52.0)
Lymphs Abs: 0.5 10*3/uL — ABNORMAL LOW (ref 0.7–4.0)
MCV: 88.6 fl (ref 78.0–100.0)
Monocytes Absolute: 0.5 10*3/uL (ref 0.1–1.0)
Platelets: 92 10*3/uL — ABNORMAL LOW (ref 150.0–400.0)
RDW: 23.2 % — ABNORMAL HIGH (ref 11.5–14.6)
WBC: 4.3 10*3/uL — ABNORMAL LOW (ref 4.5–10.5)

## 2011-05-04 LAB — BASIC METABOLIC PANEL
BUN: 58 mg/dL — ABNORMAL HIGH (ref 6–23)
Calcium: 9.1 mg/dL (ref 8.4–10.5)
Creatinine, Ser: 1.8 mg/dL — ABNORMAL HIGH (ref 0.4–1.5)
GFR: 40.45 mL/min — ABNORMAL LOW (ref >60.00)
Glucose, Bld: 225 mg/dL — ABNORMAL HIGH (ref 70–99)
Sodium: 133 mEq/L — ABNORMAL LOW (ref 135–145)

## 2011-05-04 LAB — APTT: aPTT: 31.1 s — ABNORMAL HIGH (ref 21.7–28.8)

## 2011-05-04 NOTE — Assessment & Plan Note (Signed)
Multifactorial dyspnea, with valvular heart disease much more important than jis lung disease, particularly expecting that his pleural effusions and ascites will improve with cardiac function.

## 2011-05-04 NOTE — Assessment & Plan Note (Addendum)
He has been off prednisone since October 2011, with ACTH stress test yesterday. This disease seems in remision and hopefully burned out.

## 2011-05-04 NOTE — Progress Notes (Signed)
Subjective:    Patient ID: REGINALDO [name], male    DOB: February 22, 1941, 70 y.o.   MRN: [id number]  HPI  Subjective:    Patient ID: [name], male    DOB: 08/05/1941, 70 y.o.   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  [name] is not worse, but no better. Known ascites and right pleural effusion have appeared since prednisone was stopped in Fall of 2011.[name] Last creatinine 2.3 (uncertain why this is worse). Now off insulin, since off prednisone. He is concerned that the ascitic/ pleural fluid may be indicative of some other disease process. Wife feels his dyspnea is mainly consistent with his fluid retention.   HPI- Date- 05/05/11- OSA, Valvular heart disease, pleural effusion/ ascites  Here with wife. Plans now for cardiac cath then Dr Laneta Simmers is to do aortic valve replacement and mitral repair. He does well sleeping with BiPAP 12/10 and O2, 2L  at night. PET was negative for any sign of active sarcoid- This was the test that found it originally. It also failed to demonstrate any obvious cancer, which was an underlying concern for them. He has been less active, and also  less dyspneic, . Denies cough, wheeze. Taking benadryl at night with ambien 5 for his insomnia and his postnasal drip.    Review of Systems See HPI Constitutional:   No-  weight loss, night sweats, Fevers, chills, fatigue, HEENT:   No headaches,  Difficulty swallowing,  Tooth/dental problems,  Sore throat,                No sneezing, itching, ear ache, nasal congestion, post nasal drip,   CV:   , dizziness, palpitations; chronic anterior chest pain he attributes to remote chest surgery.   GI  No heartburn, indigestion, abdominal pain, nausea, vomiting, diarrhea, change in bowel habits, loss of appetite  Resp:.  No excess mucus, no productive cough,  No non-productive  cough,  No coughing up of blood.  No change in color of mucus.  No wheezing.   Skin: no rash or lesions.  GU: no dysuria, change in color of urine, no urgency or frequency.  No flank pain.  MS:  No joint pain or swelling.  No decreased range of motion.  No back pain.  Psych:  No change in mood or affect. No depression or anxiety.  No memory loss.     Objective:   Physical Exam General- Alert, Oriented, Affect-appropriate, Distress- none acute    Abdominal obesity/ ascites,  e weight Skin- rash-none, lesions- none, excoriation- none  Lymphadenopathy- none  Head- atraumatic  Eyes- Gross vision intact, PERRLA, conjunctivae clear, secretions  Ears-  Normal-Hearing, canals, Tm Nose- Clear,  No-Septal dev, mucus, polyps, erosion, perforation   Throat- Mallampati II , mucosa clear , drainage- none, tonsils- atrophic  Neck- flexible , trachea midline, no stridor , thyroid nl, carotid no bruit  Chest - symmetrical excursion , unlabored     Heart/CV- RRR, about 52/min, Ao syst murmur , no gallop  , no rub, nl s1 s2                     - JVD- none , edema- none, stasis changes- none, varices- none     Lung- clear to P&A, wheeze- none, cough- minor dry  , dullness-lower third of right back, no rub, rub-  none     Chest wall-  Abd- tender-no, distended-yes, bowel sounds-present, HSM-   Br/ Gen/ Rectal- Not done, not indicated  Extrem- cyanosis- none, clubbing, none, atrophy- none, strength- nl  Neuro- grossly intact to observation        Assessment & Plan:     Review of Systems     Objective:   Physical Exam        Assessment & Plan:

## 2011-05-04 NOTE — Patient Instructions (Signed)
Continue BiPAP 12/10 with O2 at 2L/m --- you can take your mask with you to the hospital  Good luck and God Speed !

## 2011-05-04 NOTE — Assessment & Plan Note (Addendum)
Thisw is appropriate. He knows to bring his mask to the hospital and to remind Dr Laneta Simmers to provide BiPAP for sleep post op.

## 2011-05-07 ENCOUNTER — Telehealth: Payer: Self-pay | Admitting: *Deleted

## 2011-05-07 NOTE — Telephone Encounter (Signed)
Notified of lab results. Per Dr. Swaziland to stop Lasix and aldactone tues and wed. Prior to cath. Updated med list with wife.

## 2011-05-08 ENCOUNTER — Telehealth: Payer: Self-pay | Admitting: *Deleted

## 2011-05-08 NOTE — Telephone Encounter (Signed)
Message copied by Regis Bill on Tue May 08, 2011  2:48 PM ------      Message from: Cassell Clement      Created: Thu Apr 26, 2011 10:23 PM       Please report.  The chest x-ray shows mild cardiomegaly unchanged.  He does haveSmall bilateral pleural effusions and mild pulmonary vascular congestion. Continue present medication.

## 2011-05-08 NOTE — Discharge Summary (Signed)
[name], [name]               ACCOUNT NO.:  [id number]   MEDICAL RECORD NO.:  [id number]          PATIENT TYPE:  INP   LOCATION:  1308                         FACILITY:  [facility]   PHYSICIAN:  Valentino Hue. Magrinat, M.D.DATE OF BIRTH:  04-11-1941   DATE OF ADMISSION:  11/25/2007  DATE OF DISCHARGE:                               DISCHARGE SUMMARY   DISCHARGE DIAGNOSES:  1. Hypercalcemia of malignancy.  2. Confusion secondary to number 1.  3. Dehydration secondary to number 1.  4. Hypokalemia.  5. History of coronary artery disease status post coronary artery      bypass graft.  6. Benign prostatic hypertrophy status post transurethral needle      ablation.  7. History of prostatitis.  8. Diabetes mellitus.  9. Gout.  10.Hypertension.  11.Osteoarthritis.  12.Status post right cataract removal.  13.History of fracture of the left wrist.  14.History of vasectomy.  15.Borderline glaucoma.  16.Sleep apnea on continuous positive airway pressure.  17.History of Schamberg's disease.  18.History of atrial fibrillation, resolved.   ALLERGIES:  1. History of allergy to PENICILLIN which causes a rash.  2. MINOCIN which causes dizziness.  3. AMIODARONE which causes diarrhea.  4. Also the patient has LATEX allergy.   PROCEDURES:  1. Intravenous hydration and diuresis for hypercalcemia control.  2. Placement of Port-A-Cath.  3. Left axillary lymph node dissection.  4. Bone marrow biopsy.   HOSPITAL COURSE:  The patient was admitted on November 25, 2007 with a  calcium of 13.9 despite having received Zometa a week before.  The  patient was hydrated vigorously with normal saline and diuresis was  initiated the following day.  His calcium has decreased steadily and at  the time of discharge, the calcium is 10.1 with an albumin of 2.8.   The patient was significantly confused throughout most of the admission,  possibly due to some of the sleep medications and analgesics, partly and  likely mostly due to the hypercalcemia.  At the time of discharge now  that the calcium is nearly normal, the patient is fully alert and  oriented.   On November 27, 2007, the patient was taken to the emergency room where  Dr. Derrell Lolling placed a Port-A-Cath in the right anterior chest wall and  proceeded to left axillary lymph node dissection.  It should be noted  that the fact that we cannot be 100% sure the patient will need  intravenous chemotherapy, it was discussed with the patient previously  and he very much wanted to have a port placed in one procedure with the  axillary lymph node dissection despite that uncertainty.  The patient  also had a bone marrow biopsy under Dr. Lonia Skinner.  All those results  are pending at the time of this dictation.   Chest x-ray obtained after the procedure showed the Port-A-Cath in good  position.   At the time of discharge, the patient's temperature is 99.4, pulse 65,  respirations 21 and blood pressure 118/70 with a room air saturation of  95%.  The patient needed to have a Foley placed because of the diuresis.  This is being removed at the time of discharge and we will make sure the  patient is able to urinate without significant difficulty before he is  actually released.   The patient's diabetes was controlled largely on oral medications with a  sliding scale and the most recent readings were 151, 168 and 143.  Some  of that mild elevation was due to the fact that he was switched over to  D5 half-normal on the day of surgery since he was kept n.p.o. most of  that day and we did not want him to become hypoglycemic.   On November 26, 2007, the patient had an episode of chest discomfort.  Electrocardiogram was obtained which was very poor quality, but to my  reading, did not show any acute change.  The patient was treated with  proton pump inhibitors for what almost certainly was transient reflux  symptoms.   LABORATORY DATA:  At the time of  discharge, the rest of the patient's  lab work shows a potassium of 3.1, CO2 of 24, creatinine of 1.48, BUN  11, estimated GFR of 47.  Normal liver function tests.  White cell count  of 6.9, hemoglobin 10.2, platelets 132,000 with an absolute lymphocyte  count of 400, absolute neutrophil count of 5.9.   CONDITION ON DISCHARGE:  Improved.   DISCHARGE MEDICATIONS:  1. Altace 10 mg daily.  2. Aspirin 81 mg daily.  3. Ambien 10 mg at bedtime as needed.  4. Caduet 10/10 two tablets daily.  5. Metformin 5 mg 2 tablets twice daily.  6. Digoxin 0.25 mg daily.  7. Hytrin 5 mg at bedtime.  8. Xanax 0.5 mg 3 times a day as needed.  9. Allopurinol 300 mg daily.  10.Calcitonin 100 mg subcutaneously twice daily.  11.Actos 45 mg daily.  12.Toprol XL 100 one daily.  13.Prednisone 20 mg twice a day with meals.  14.Potassium chloride 20 mEq by mouth daily.  15.Dilaudid 2 mg every 4 hours as needed for pain.  16.Colace 2 daily.  17.Prilosec 20 mg daily as needed.   DISCHARGE INSTRUCTIONS:  1. The patient will be on an ADA diet as before.  2. He will keep his wounds clean and dry.  3. He will be able to shower as of November 29, 2007.  4. He may walk or resume normal activities as instructed by Dr.      Derrell Lolling.  5. He will call for pain, fever, rash, erythema, swelling or any other      acute complications of his procedures.   FOLLOW UP:  1. He will see Dr. Derrell Lolling in 10-14 days.  2. He will see [name] on December 02, 2007.  3. He will call my office for an appointment prior to discharge.      Valentino Hue. Magrinat, M.D.  Electronically Signed     GCM/MEDQ  D:  11/28/2007  T:  11/28/2007  Job:  [id number]   cc:   Alfonse Alpers. Dagoberto Ligas, M.D.  Fax: [phone]   Angelia Mould. Derrell Lolling, M.D.  [address], [address]  [place]  [place] [address]   Cassell Clement, M.D.  Fax: [phone]   D. Oley Balm III, M.D.  Fax: [phone]

## 2011-05-08 NOTE — Op Note (Signed)
NAMEJOSMAR, [name]               ACCOUNT NO.:  [id number]   MEDICAL RECORD NO.:  [id number]          PATIENT TYPE:  INP   LOCATION:  1308                         FACILITY:  [facility]   PHYSICIAN:  Angelia Mould. Derrell Lolling, M.D.DATE OF BIRTH:  17-Sep-1941   DATE OF PROCEDURE:  11/27/2007  DATE OF DISCHARGE:                               OPERATIVE REPORT   PREOPERATIVE DIAGNOSIS:  Lymphadenopathy, hypercalcemia.   POSTOPERATIVE DIAGNOSIS:  Lymphadenopathy, hypercalcemia.   OPERATION PERFORMED:  1. Left axillary lymph node biopsy.  2. Insertion of Power Port venous vascular access device.   SURGEON:  Dr. Claud Kelp.   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  He has had extensive workup which shows diffuse PET scan  uptake in multiple long bones, vertebral column and pelvic bones.  He  has no sclerotic lesions, however on CT scan and his PSA was normal.  Dr. Darnelle Catalan feels that he would most likely has a lymphoma.  Although  we cannot rule out myeloma at this time.  Dr. Darnelle Catalan and I and Dr.  Jimmey Ralph have discussed this is an outpatient and feel that he should be  brought to operating room for a left axillary lymph node biopsy,  insertion of a Port-A-Cath and a bone marrow biopsy.  Dr. Darnelle Catalan has  arranged for Dr. Oley Balm to do the bone marrow biopsy.   OPERATIVE TECHNIQUE:  Following induction of general endotracheal  anesthesia, the patient was identified as to correct patient and correct  procedure and correct site.  Intravenous antibiotics were given prior to  the incision.  The left axilla was prepped and draped in sterile  fashion.  0.5% Marcaine with epinephrine was used as local infiltration  anesthetic.  Transverse incision was made in the left axilla at the  hairline.  Dissection was carried down through subcutaneous tissue until  we entered the axillary space.  I  felt that he had palpably enlarged  lymph nodes but as I dissected them out they were quite soft and  partially fatty replaced.  Because of this I dissected out a large area  which I thought would encompass five or six lymph nodes.  Some vascular  channels were controlled with metal clips and divided.  After I had  removed this packet I could see the thoracodorsal neurovascular bundle  which was preserved.  I palpated around the axilla in level I and level  II and there were no other palpable masses.  The specimen was sent to  the lab.  Dr. Laureen Ochs looked at the specimen grossly and confirmed that he  found a few lymph nodes but they were small soft and partially fatty  replaced.  Neither Dr. Laureen Ochs nor I were certain that this was a  pathologic adenopathy.  The left axilla was irrigated with saline.  Hemostasis was excellent.  The deeper tissues were closed with  interrupted sutures of 3-0 Vicryl.  The skin was closed with running  subcuticular suture of 4-0 Monocryl and Steri-Strips and clean gauze  bandage.  The patient's arms were then placed at his side.  A small roll was  placed behind the shoulders.  The neck and chest were prepped and draped  in a sterile fashion.  With the patient in Trendelenburg position, a  right internal jugular venipuncture was attempted but I entered the  carotid artery with the 22 gauge finder needle and chose to abandon that  site.  He had previously had a Swan-Ganz catheter in that site.  I held  pressure for about 5 minutes.  There was no hematoma.   I then performed a right subclavian venipuncture, inserted a guidewire  into the superior vena cava under fluoroscopic guidance without any  difficulty.  The tip of the wire looked good.  I marked the skin where I  thought the tip of the port should be at the junction of the superior  vena cava and the right atrium.  Small incision was made at the wire  insertion site.  0.5% Marcaine with epinephrine was used  as a local  infiltration anesthetic.  A transverse incision was made below the right  clavicle about 3 cm below.  Subcutaneous pocket was created.  Using a  tunneling device I drew the catheter between the wire insertion site and  the port pocket site.  I measured where the tip of the catheter should  be on the chest wall and cut the catheter at appropriate length.  The  catheter was secured to the port with a locking device and the port and  catheter flushed with heparinized saline.  The port was then sutured to  the pectoralis fascia with three interrupted sutures of 2-0 Prolene.  The catheter was flushed again.  With the patient back in Trendelenburg  position I inserted the dilator and peel-away sheath assembly over the  guidewire into the central venous circulation.  The wire and dilator  were removed.  The catheter was inserted into the sheath and the sheath  removed.  The catheter flushed easily and had excellent blood return.  Fluoroscopy was performed and the catheter tip appear to be in the  superior vena cava just above the right atrium.  There was no crimping  or kink of the catheter anywhere.  I flushed the port and the catheter  with concentrated heparin.  The subcutaneous tissue was closed with  interrupted sutures of 3-0 Vicryl.  The skin incisions were closed with  subcuticular sutures of 4-0 Monocryl and Steri-Strips.  Clean bandages  were placed.   At this point the patient had done well.  There were no complications.  At this point, the estimated blood loss was about 10 mL.  Dr. Oley Balm then came to the operating room to perform a bone marrow biopsy  and he will dictate that separately.  The intent is for the patient to  go to the recovery room after this procedure.      Angelia Mould. Derrell Lolling, M.D.  Electronically Signed     HMI/MEDQ  D:  11/27/2007  T:  11/28/2007  Job:  [id number]   cc:   Valentino Hue. Magrinat, M.D.  Fax: [phone]   Alfonse Alpers. Dagoberto Ligas,  M.D.  Fax: [phone]

## 2011-05-08 NOTE — H&P (Signed)
[name], [name]               ACCOUNT NO.:  [id number]   MEDICAL RECORD NO.:  [id number]          PATIENT TYPE:  INP   LOCATION:  1308                         [facility]ACILITY:  [facility]   PHYSICIAN:  Valentino Hue. Magrinat, M.D.DATE OF BIRTH:  1941/09/30   DATE OF ADMISSION:  11/25/2007  DATE OF DISCHARGE:                              HISTORY & PHYSICAL   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   The patient's symptoms date back to August of this year when he  presented to [name] with malaise and weakness.  He found his calcium  to be elevated at 12 and checked his PTH which was on repeated occasions  less than 6.  Urinary calcium was 456.  Repeat calcium was elevated at  13.  A 25-hydroxy vitamin D level was low at 20, and the 125 hydroxy was  53.  The ACE level was 5, normal being between 12 and 68.   [name] correctly suspected hypercalcemia of malignancy and  accordingly set the patient up for CT scans of the chest and abdomen  which showed some scarring in the left upper lobe and some low-grade  mediastinal adenopathy.  There was also a renal cyst of likely no  importance.  The patient was worked up for possible myeloma with no  paraprotein identified.   At this point, the patient was referred to [place], and we obtained a PET  scan on November 18 which showed strong uptake throughout the skeleton  but also widespread lymph node bed and the spleen, all of this being  most consistent with a lymphoproliferative process.  An MRI of the brain  was obtained on November 19, and this showed no suggestion of  leptomeningeal involvement by his primary process.  There was mild  atrophy and small-vessel disease.  The clivus and calvarium, of course,  were likely involved with whenever the process is that is causing the  patient's chief problem.   At this point, we are admitting the patient for better  control of  hypercalcemia, which has persisted despite the patient receiving Zometa  a week before admission, and also for definitive diagnosis which will  include bone marrow biopsy and axillary lymph node dissection.   The past medical history is significant for:  1. Coronary artery disease, status post CABG in 2006.  2. Benign prostatic hypertrophy status post TUNA in 2002.  3. History of prostatitis.  4. History of diabetes with the most recent A1c of 7.6.  5. History of gout.  6. History of hypertension.  7. History of osteoarthritis.  8. History of right cataract removal.  9. History of remote fracture of the left wrist.  10.History of vasectomy.  11.History of borderline glaucoma.  12.History of sleep apnea on CPAP.  13.History of Schaumberg's disease.  14.History of atrial fibrillation resolved with cardioversion.  15.History of mild to moderate anemia.   FAMILY HISTORY:  The patient's mother died at the age of 19.  The  patient's father died from a stroke in the  setting of coronary artery  disease at the age of 70.  The patient is an only child.   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  He has been married have 47 years to  [name], and their three children are [name] and [name].  The  patient's daughter died this summer.   ALLERGIES:  HE IS ALLERGIC TO PENICILLIN, INTOLERANT TO MINOCYCLINE AND  CODEINE AND HAS A LATEX ALLERGY.   MEDICATIONS:  Admission medications include:  1. Altace 10 mg.  2. Aspirin 81 mg.  3. Ambien 10 mg.  4. Caduet 10/10 two tablets daily.  5. Digoxin 0.25 mg.  6. Glucotrol 10 mg daily.  7. Hytrin 5 mg at bedtime.  8. Lopressor 50 mg twice daily.  9. Xanax 0.5 mg 3 times a day as needed/  10.Allopurinol 300 mg daily/  11.Calcitonin 10 mg subcutaneously twice daily.  12.Doxycycline 100 mg twice daily.  13.Coenzyme Q10 400 daily.  14.Magnesium 1000 mg daily as  needed.   REVIEW OF SYSTEMS:  The patient is somewhat confused at the time of  admission, although he knows person, place and year.  He is just the  little bit today on his answers, and he had also dilatory in his account  of why he is in the hospital and what is being expected.  Overall,  however, the patient has a good understanding of the situation as does  his family who are present with him at the time of admission.   Specifically, he denies unusual headaches.  She has had some scotomata  which have not changed.  No sinus symptoms.  No nausea or vomiting.  No  taste alteration.  No loss of appetite.  Some weight loss which he did  not quantitate.  No cough, phlegm production, pleurisy, shortness of  breath or hemoptysis.  No chest pain or pressure with mild activity at  present (he is not exercising on a regular basis).  No change in bowel  or bladder habits.  No bleeding, rash, fever.  No drenching night  sweats.   PHYSICAL EXAMINATION:  His temperature is 98.4, pulse 61, respirations  19 and blood pressure 181/62.  His room air saturation is 99%.  Sclerae are not icteric.  Oropharynx shows no thrush.  NECK:  Is supple, and I do not palpate any cervical, supraclavicular,  axillary or inguinal adenopathy.  LUNGS:  Show no crackles or wheezes with adequate excursion.  HEART:  Regular rate and rhythm.  ABDOMEN:  Soft, obese, nontender.  Bowel sounds are positive.  No  organomegaly is palpated.  MUSCULOSKELETAL EXAM:  No focal spinal tenderness.  NEUROLOGIC EXAM:  Is nonfocal.   LABORATORY DATA:  Labs on admission show a white cell count of 5.7,  hemoglobin 11.3 and platelets 145,000.  Prothrombin time is 14.4  seconds, PTT 41 seconds.  Urinalysis is unremarkable except for some  urine protein.  Sodium was 135, potassium 4.0, chloride 98, CO2 27,  glucose 331, BUN 24, creatinine 1.9, total bilirubin 1.0, alkaline  phosphatase 56, AST 22, ALT 18, total protein 6.8, albumin 3.0  and  calcium 13.9.   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   He has been scheduled for left axillary lymph node dissection with  concurrent Port-A-Cath placement and bone marrow biopsy for  December 4.  We will hope his calcium will have been controlled by that time.  The  patient understands that we do not have a definitive diagnosis and  therefore cannot predict definitively that he will need intravenous  chemotherapy, but this seems sufficiently likely that the patient is  willing to have a port placed at the same time so he does not have to go  through two separate procedures in the future.   Further plans will depend on his definitive diagnosis once the  procedures just enumerated have been completed.      Valentino Hue. Magrinat, M.D.  Electronically Signed     GCM/MEDQ  D:  11/28/2007  T:  11/28/2007  Job:  [id number]   cc:   Cassell Clement, M.D.  Fax: [phone]   Alfonse Alpers. Dagoberto Ligas, M.D.  Fax: [phone]   Angelia Mould. Derrell Lolling, M.D.  [address]  [place]  [place] [address]   Jamison Neighbor, M.D.  Fax: [phone]

## 2011-05-08 NOTE — Telephone Encounter (Signed)
advised

## 2011-05-09 ENCOUNTER — Inpatient Hospital Stay (HOSPITAL_BASED_OUTPATIENT_CLINIC_OR_DEPARTMENT_OTHER): Admission: RE | Admit: 2011-05-09 | Payer: Medicare Other | Source: Ambulatory Visit | Admitting: Cardiology

## 2011-05-09 ENCOUNTER — Emergency Department (HOSPITAL_COMMUNITY)
Admission: EM | Admit: 2011-05-09 | Discharge: 2011-05-09 | Disposition: A | Discharge status: Home / Self Care | Payer: Medicare Other | Attending: Emergency Medicine | Admitting: Emergency Medicine

## 2011-05-09 DIAGNOSIS — N19 Unspecified kidney failure: Secondary | ICD-10-CM | POA: Insufficient documentation

## 2011-05-09 DIAGNOSIS — R319 Hematuria, unspecified: Secondary | ICD-10-CM | POA: Insufficient documentation

## 2011-05-09 DIAGNOSIS — I10 Essential (primary) hypertension: Secondary | ICD-10-CM | POA: Insufficient documentation

## 2011-05-09 DIAGNOSIS — N390 Urinary tract infection, site not specified: Secondary | ICD-10-CM | POA: Insufficient documentation

## 2011-05-09 DIAGNOSIS — E119 Type 2 diabetes mellitus without complications: Secondary | ICD-10-CM | POA: Insufficient documentation

## 2011-05-09 DIAGNOSIS — D869 Sarcoidosis, unspecified: Secondary | ICD-10-CM | POA: Insufficient documentation

## 2011-05-09 DIAGNOSIS — Z951 Presence of aortocoronary bypass graft: Secondary | ICD-10-CM | POA: Insufficient documentation

## 2011-05-09 LAB — URINE MICROSCOPIC-ADD ON

## 2011-05-09 LAB — CBC
Hemoglobin: 12.3 g/dL — ABNORMAL LOW (ref 13.0–17.0)
MCH: 27.9 pg (ref 26.0–34.0)
MCV: 83 fL (ref 78.0–100.0)
Platelets: 78 10*3/uL — ABNORMAL LOW (ref 150–400)
RBC: 4.41 MIL/uL (ref 4.22–5.81)
WBC: 4.8 10*3/uL (ref 4.0–10.5)

## 2011-05-09 LAB — DIFFERENTIAL
Basophils Relative: 0 % (ref 0–1)
Eosinophils Relative: 2 % (ref 0–5)
Lymphocytes Relative: 10 % — ABNORMAL LOW (ref 12–46)
Monocytes Relative: 9 % (ref 3–12)
Neutro Abs: 3.8 10*3/uL (ref 1.7–7.7)
Neutrophils Relative %: 79 % — ABNORMAL HIGH (ref 43–77)

## 2011-05-09 LAB — POCT I-STAT, CHEM 8
BUN: 65 mg/dL — ABNORMAL HIGH (ref 6–23)
Chloride: 101 mEq/L (ref 96–112)
Creatinine, Ser: 2.3 mg/dL — ABNORMAL HIGH (ref 0.4–1.5)
Potassium: 4.5 mEq/L (ref 3.5–5.1)
Sodium: 135 mEq/L (ref 135–145)

## 2011-05-09 LAB — URINALYSIS, ROUTINE W REFLEX MICROSCOPIC
Glucose, UA: 250 mg/dL — AB
pH: 5 (ref 5.0–8.0)

## 2011-05-10 LAB — URINE CULTURE
Colony Count: 25000
Culture  Setup Time: 201205161336

## 2011-05-11 NOTE — Cardiovascular Report (Signed)
[name], [name]               ACCOUNT NO.:  [id number]   MEDICAL RECORD NO.:  [id number]          PATIENT TYPE:  INP   LOCATION:  2022                         FACILITY:  [facility]   PHYSICIAN:  Colleen Can. Deborah Chalk, M.D.DATE OF BIRTH:  [birth year]   DATE OF PROCEDURE:  09/03/2005  DATE OF DISCHARGE:                              CARDIAC CATHETERIZATION   PROCEDURE:  Left heart catheterization with selective coronary angiography  and left ventricular angiography with Angio-Seal.   SITE OF ENTRY:  Percutaneous right femoral artery.   CATHETER:  A 6-French __________ Judkins' left coronary cath, 6-French left  coronary artery bypass graft catheter, 6-French pigtail ventricular  catheter.   CONTRAST MEDIA:  Omnipaque.   MEDICATIONS GIVEN PRIOR TO PROCEDURE:  Valium 10 mg b.i.d.   MEDICATIONS GIVEN DURING PROCEDURE:  Versed 5 mg IV.   COMMENTS:  The patient tolerated the procedure well.   HEMODYNAMIC DATA:  The aortic pressure was 121/63, LV was 134/13-36. There  was no aortic valve gradient noted on pullback.   ANGIOGRAPHIC DATA:  Left ventricular angiogram was performed in the RAO  projection. Overall cardiac size and silhouette were normal. The global  ejection fraction was 58%, but there was inferior basilar hypokinesia. There  was no mitral regurgitation.   CORONARY ARTERIES:  1.  Left main coronary artery has 50% distal narrowing.  2.  Left circumflex had ostial narrowing. The first obtuse marginal had a      70% to 80% narrowing, it was of moderate size. The left circumflex      basically bifurcated and continued mainly as a large second obtuse      marginal. There was 90%+ narrowing of the second obtuse marginal      involving the ostium of this vessel. It did involve bifurcation and the      continuation branch had 70% to 90% narrowing at this point. It would      have been a complex bifurcation angioplasty if it would have been      possible. The continuation of the  left circumflex is mainly in the AV      groove and would be hard to bypass.  3.  Left anterior descending. The left anterior descending had a 70%      proximal lesion. We then had bifurcation into a originally large      diagonal vessel and left anterior descending. The left anterior      descending had a 70% to 80% narrowing and the first diagonal had a 90%      narrowing. There is a large septal perforating branch with a 90%      stenosis present as well. The distal diagonal of the left anterior      descending would be well suited for bypass grafting.  4.  Right coronary artery. 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 We could not selectively engage it.  There was 70% narrowing in      the proximal portion and it was totally occluded at the acute margin.      The distal  right coronary artery filled from left to right collaterals.   OVERALL IMPRESSION:  1.  Mild inferior basilar hypokinesia.  2.  Severe three-vessel coronary artery disease with totally occluded right      coronary artery, 90% diagonal, 70% to 80% left anterior descending, 95%      second obtuse marginal, 70% to 80% first obtuse marginal.   DISCUSSION:  We would recommend Mr. Leitz proceed on with coronary artery  bypass grafting.      Colleen Can. Deborah Chalk, M.D.  Electronically Signed     SNT/MEDQ  D:  09/03/2005  T:  09/03/2005  Job:  [id number]

## 2011-05-11 NOTE — Procedures (Signed)
[facility]  Patient:    Robyn Haber., M.D. Visit Number: [id number] MRN: [id number]          Service Type: Attending:  Clovis Pu. Patty Sermons, M.D. Dictated by:   Clovis Pu Patty Sermons, M.D. Proc. Date: 01/01/02   CC:         Bedelia Person, M.D.   Procedure Report  PROCEDURE:  Direct current cardioversion.  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  He has been anticoagulated.  He has remained in atrial fibrillation despite IV amiodarone drip.  Elective cardioversion is now undertaken.  DESCRIPTION OF PROCEDURE:  The patient was given a total of 600 mg of IV pentothal by Dr. Bedelia Person, following which the patient was given a single 100 watt second shock using the AP paddles.  He promptly converted to sinus rhythm.  There were no postanesthetic complications. Dictated by:   Clovis Pu Patty Sermons, M.D. Attending:  Clovis Pu Patty Sermons, M.D. DD:  01/01/02 TD:  01/02/02 Job: [id number]

## 2011-05-11 NOTE — Consult Note (Signed)
[name], [name]               ACCOUNT NO.:  [id number]   MEDICAL RECORD NO.:  [id number]          PATIENT TYPE:  INP   LOCATION:  2022                         FACILITY:  [facility]   PHYSICIAN:  Evelene Croon, M.D.     DATE OF BIRTH:  03-08-1941   DATE OF CONSULTATION:  09/04/2005  DATE OF DISCHARGE:                                   CONSULTATION   REFERRING PHYSICIAN:  Dr. Cassell Clement and Dr. Roger Shelter.   REASON FOR CONSULTATION:  A 50% left main and severe three-vessel coronary  artery disease with acute coronary syndrome.   HISTORY OF PRESENT ILLNESS:  I was asked to consult on this patient by Dr.  Cassell Clement and Dr. Roger Shelter.  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  The patient  has had previous cardiac catheterizations in 1997, and in 2000, with  significant coronary disease, he said that he was told that he probably  should have surgery at that time, but desired to continue medical therapy  and decided to retire from his practice.  He denies any significant chest  pain until about two days prior to admission when he awoke with some  tachycardia and vague chest discomfort, that he described as cardiac  awareness.  He drank some Half-n-Half without improvement.  He later rubbed  some nitroglycerin paste on himself and the discomfort continued to worsen.  When it became about 8/10, he decided to call his cardiologists office.  He  took a sublingual nitroglycerin and came to the emergency room.  His pain  gradually subsided after the administration of oxygen.  He did have a rise  in his troponin to 2.44 and a rise in his CPK-MB to 19 with a normal total  CPK.  Electrocardiogram showed ST depression on presentation which resolved.  He underwent cardiac catheterization yesterday by Dr. Deborah Chalk which showed  an ejection fraction of 50%  with inferior basal hypokinesis.  The left main  coronary artery had about 50% distal stenosis.  The left circumflex had 95%  second marginal with bifurcation disease of 70-90%.  The first marginal had  about 70-80% stenosis.  The LAD had about 70% proximal stenosis and about  90% stenosis at the first diagonal.  There was 70-80% stenosis beyond this  in the LAD.  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   REVIEW OF SYSTEMS:  GENERAL:  He denies any fevers or chills.  He has had no  fatigue.  He has a fairly sedentary lifestyle, but does do some work  outside.  He has had progressive weight gain over the past couple of years  which he ascribes to binge eating at night after he takes Ambien for  insomnia.  EYES:  He has elevated pressure in his  eyes without a diagnosis  of glaucoma.  He has early cataract in his right eye.  ENT:  He has mild  hearing loss.  ENDOCRINE:  He has a history of adult-onset diabetes.  He  denies hypothyroidism.  CARDIOVASCULAR:  He denies any prior history of  chest pain or pressure.  He denies shortness of breath.  He has had no PND  or orthopnea.  He has mild peripheral edema in his legs, left greater than  right.  RESPIRATORY:  Denies cough or sputum production.  GI:  He denies  nausea or vomiting.  He has had no melena or bright red blood per rectum.  GU:  He does have some urinary hesitancy and trouble emptying his bladder.  He is uncircumcised.  NEUROLOGIC:  He denies any focal weakness or numbness.  He denies dizziness or syncope.  MUSCULOSKELETAL:  He has arthritis  involving both knees.  PSYCHIATRIC:  Negative.  ALLERGIES:  HE HAS A LATEX ALLERGY.  HE SAID THAT WHILE USING LATEX GLOVES  TO DO SOME WORK AROUND HIS HOUSE, HE WAS RUBBING HIS EYES AND HAD SWELLING  OF  HIS EYES AND ASSUMED THAT THIS WAS A LATEX ALLERGY.  HE HAS NEVER BEEN  TESTED AND HAS HAD NO ANAPHYLAXIS.  HE HAS A PENICILLIN ALLERGY THAT CAUSES  RASH.  HE HAS A HISTORY OF DIZZINESS WITH MINOCIN, BUT TOLERATES OTHER  TETRACYCLINES WELL.  HEMATOLOGIC:  He has no history of easy bleeding or  bleeding disorders.   MEDICATIONS PRIOR TO ADMISSION:  1.  Hytrin 10 mg q.h.s.  2.  Norvasc 10 mg daily.  3.  Aspirin 325 mg daily.  4.  Altace 10 mg daily.  5.  Lipitor 80 mg daily.  6.  Zyloprim 300 mg daily.  7.  Avandia 8 mg daily.  8.  Glucotrol 5 mg daily.  9.  Nitroglycerin p.r.n.   SOCIAL HISTORY:  He is a retired Field se[employer]ologist.  He smoked  cigars until 1997.  He drinks about one alcoholic drink per day.  He is  married and lives with his wife.   FAMILY HISTORY:  Negative.   PHYSICAL EXAMINATION:  Blood pressure 130/80, pulse 65 and regular,  respiratory rate is 16 and unlabored.  He is an obese white male in no  distress.  HEENT:  Normocephalic and atraumatic.  Pupils are equal and reactive to  light and accommodation.  Extraocular muscles are intact.  His throat is  clear.  NECK:  Exam shows normal carotid pulses bilaterally.  There are bilateral  cervical bruits or transmitted murmurs in the neck.  There is no adenopathy  or thyromegaly.  CARDIAC:  Exam shows a regular rate and rhythm with a grade 1/6 high-pitched  systolic murmur over the aorta.  There is no S3.  LUNGS:  Clear.  ABDOMEN:  Exam shows active bowel sounds.  His abdomen is soft, very obese  and nontender.  There are no palpable masses or organomegaly.  EXTREMITIES:  Exam shows mild bilateral lower extremity edema with some  brownish discoloration of his skin, both lower legs.  Pedal pulses are  palpable bilaterally.  SKIN:  Warm and dry.  NEUROLOGIC:  Exam shows him to be alert and oriented x3.  Motor and sensory  exams are grossly normal.  LABORATORY EXAMINATION:  Shows normal electrolytes with  a BUN of 20,  creatinine 1.1, glucose 230, white blood cell count 4.2, hemoglobin 13.7,  platelet count 127.  His hemoglobin A1c was  7.2.  Liver function profile was  normal.  His peak CPK was 225 with an MB of 19 and a troponin I of 2.44.  Chest x-ray showed no acute disease.  Electrocardiogram showed normal sinus  rhythm with nonspecific intraventricular conduction delay on September 02, 2005.   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  I agree that  coronary artery bypass graft surgery is the best treatment to prevent  further ischemia and infarction.  I discussed the operative procedure with  him including alternatives, benefits, and risks, including bleeding, blood  transfusion, infection, stroke, myocardial infarction, graft failure, and  death.  He understands and would like to proceed with surgery.  He is  adamant that he would like to go home prior to surgery, I  told him that I could do his surgery on Friday this week.  I discussed the  risks of going home including myocardial infarction and death.  He said that  he understands this, but would still like to go home prior to surgery and  understands and accepts that risk.      Evelene Croon, M.D.  Electronically Signed     BB/MEDQ  D:  09/04/2005  T:  09/04/2005  Job:  [id number]   cc:   Colleen Can. Deborah Chalk, M.D.  Fax: [phone]

## 2011-05-15 ENCOUNTER — Telehealth: Payer: Self-pay | Admitting: Cardiology

## 2011-05-15 NOTE — Telephone Encounter (Signed)
Patient was sch'd for a cath last Wed and could not get it done.  He has been taking Macrodantin for a cystitis.  He wants to know what he should do prior to rescheduling the cath. He also said that his labwork was out of whack.

## 2011-05-15 NOTE — Telephone Encounter (Signed)
Advised to contact Dr. Belva Crome office regarding Macrodantin and recheck.  Need ok from him before can proceed per Synetta Fail.  Call and get in with him sooner than end of June.

## 2011-05-16 ENCOUNTER — Encounter: Payer: Self-pay | Admitting: Cardiology

## 2011-05-18 ENCOUNTER — Other Ambulatory Visit: Payer: Self-pay | Admitting: *Deleted

## 2011-05-18 DIAGNOSIS — G4700 Insomnia, unspecified: Secondary | ICD-10-CM

## 2011-05-18 MED ORDER — ZOLPIDEM TARTRATE 10 MG PO TABS: 10.0000 mg | ORAL_TABLET | Freq: Every evening | ORAL | Status: DC | PRN

## 2011-05-18 NOTE — Telephone Encounter (Signed)
Patient phoned requesting rx refill on [place]

## 2011-05-23 ENCOUNTER — Ambulatory Visit (INDEPENDENT_AMBULATORY_CARE_PROVIDER_SITE_OTHER): Payer: Medicare Other | Admitting: Cardiology

## 2011-05-23 ENCOUNTER — Encounter: Payer: Self-pay | Admitting: Cardiology

## 2011-05-23 DIAGNOSIS — I359 Nonrheumatic aortic valve disorder, unspecified: Secondary | ICD-10-CM

## 2011-05-23 DIAGNOSIS — I350 Nonrheumatic aortic (valve) stenosis: Secondary | ICD-10-CM

## 2011-05-23 DIAGNOSIS — I2510 Atherosclerotic heart disease of native coronary artery without angina pectoris: Secondary | ICD-10-CM

## 2011-05-23 DIAGNOSIS — I38 Endocarditis, valve unspecified: Secondary | ICD-10-CM

## 2011-05-23 LAB — BASIC METABOLIC PANEL
BUN: 61 mg/dL — ABNORMAL HIGH (ref 6–23)
Calcium: 9.6 mg/dL (ref 8.4–10.5)
GFR: 31.63 mL/min — ABNORMAL LOW (ref >60.00)
Glucose, Bld: 222 mg/dL — ABNORMAL HIGH (ref 70–99)
Potassium: 5 mEq/L (ref 3.5–5.1)
Sodium: 131 mEq/L — ABNORMAL LOW (ref 135–145)

## 2011-05-23 NOTE — Assessment & Plan Note (Signed)
We will proceed with cardiac catheterization as noted above.

## 2011-05-23 NOTE — Assessment & Plan Note (Signed)
We will make sure the patient is adequately hydrated prior to cardiac catheterization. We will attempt to minimize dye load. His diuretics and metformin will be held for the procedure. He is not currently on ACE inhibitors, angiotensin receptor blockers, or nonsteroidal anti-inflammatory drugs.

## 2011-05-23 NOTE — Progress Notes (Signed)
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 Patty Sermons for cardiac catheterization. He has severe aortic stenosis with moderate insufficiency. He has had progressive symptoms of dyspnea and congestive heart failure. He denies any significant chest pain. He has a known history of coronary disease and is status post coronary bypass surgery in September of 2006 by Dr. Laneta Simmers. This included an LIMA graft to the LAD, saphenous vein graft to the diagonal, saphenous vein graft sequentially to the first and second obtuse marginal vessels, and saphenous vein graft to the PDA. 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 He has chronic kidney disease with a baseline creatinine of 2.2. He was scheduled for cardiac catheterization recently but developed hematuria related to hemorrhagic cystitis. This has been treated and his hematuria has resolved. He is now ready to proceed with cardiac catheterization for consideration of aortic valve replacement.  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 He has been on steroids in the past. This has been stable. He also has history of sleep apnea and pulmonary hypertension. He is diabetic. Because of his renal insufficiency and diabetes we have recommended hospitalization prior to his cardiac catheterization for overnight hydration and we will hold his diuretics at least 24 hours prior to this procedure.  Current Outpatient Prescriptions on File Prior to Visit  Medication Sig Dispense Refill  . allopurinol (ZYLOPRIM) 300 MG tablet Take 300 mg by mouth daily.        [name] aspirin 81 MG tablet Take 81 mg by mouth daily.        . diazepam (VALIUM) 5 MG tablet Take 5 mg by mouth every 6 (six) hours as needed. Taking as needed       . digoxin (LANOXIN) 0.25 MG tablet Once a day       . furosemide (LASIX) 20 MG tablet Take 80 mg by mouth 2  (two) times daily.       . isosorbide mononitrate (IMDUR) 120 MG 24 hr tablet Take 120 mg by mouth daily.        . metoprolol (LOPRESSOR) 50 MG tablet 1 twice a day       . spironolactone (ALDACTONE) 25 MG tablet 25 mg daily.       [name] terazosin (HYTRIN) 10 MG capsule Once a day       . zolpidem (AMBIEN) 10 MG tablet Take 1 tablet (10 mg total) by mouth at bedtime as needed.  30 tablet  5    Allergies  Allergen Reactions  . Actos (Pioglitazone Hydrochloride)   . Altace   . Amiodarone   . Indocin   . Latex   . Lotensin     ?  . Norvasc (Amlodipine Besylate)   . Orudis (Ketoprofen)   . Penicillins   . Pioglitazone     REACTION: causes CHF and edema  . Talwin   . Zoloft     Past Medical History  Diagnosis Date  . Coronary heart disease     stent, CABG, RBBB  . Myocardial infarction     1995, 2002, 2006  . Valvular heart disease     aortic stenosis/regurgitation  . OSA (obstructive sleep apnea)   . Sarcoid     pulmonary and bone marrow  . Hypercalcemia   . Bone marrow disease   . Diabetes mellitus   . Chronic  kidney disease (CKD), stage III (moderate)   . Hemorrhagic cystitis   . Gout   . HTN (hypertension)     Past Surgical History  Procedure Date  . Coronary artery bypass graft 2006  . Cataract surgery 2007  . Prostate surgery 2002  . Vasectomy     History  Smoking status  . Former Smoker  . Types: Cigarettes, Pipe  . Quit date: 12/25/1995  Smokeless tobacco  . Not on file    History  Alcohol Use: Not on file    Family History  Problem Relation Age of Onset  . Heart attack Father   . Allergies    . Asthma    . Heart disease    . Cancer      Review of Systems: The review of systems is positive for recent hemorrhagic cystitis treated with Macrodantin.  He is still short of breath with minimal activity and class III symptoms. He feels that he has mild ascites.All other systems were reviewed and are negative.  Physical Exam: BP 152/60  Pulse 68   Ht 5\' 11"  (1.803 m)  Wt 211 lb (95.709 kg)  BMI 29.43 kg/m2 He is an overweight chronically ill-appearing white male in no acute distress. Pupils are equal round and reactive. Extraocular movements are full. He has no scleral icterus. Oropharynx is clear. Neck is supple. Carotid upstrokes are without a reduced without bruits. There is no JVD, thyromegaly, or adenopathy. Lungs are clear. Cardiac exam reveals a harsh grade 3/6 systolic murmur of aortic stenosis. Abdomen is soft and nontender. There is no definite fluid wave. Bowel sounds are normal. He does have hepatic enlargement. Femoral and pedal pulses are palpable he has no significant edema or phlebitis. There is no cyanosis. Neurologic exam reveals he is alert and oriented x3. His cranial nerves II through XII are intact. He has no asterixis or focal findings. LABORATORY DATA: BUN is 61 creatinine 2.2. Sodium is 131.  Assessment / Plan:

## 2011-05-23 NOTE — Assessment & Plan Note (Signed)
Progressive severe aortic stenosis in a patient who is increasingly symptomatic. We will plan on proceeding with right and left heart catheterization with coronary and graft angiography in order to consider him for possible aortic valve replacement. Given his underlying renal insufficiency he will be admitted overnight for IV hydration with saline and we will hold his diuretics at least 24 hours prior to his procedure. Metformin will be held for his procedure. Efforts will be made to minimize his contrast load and since his echocardiogram showed good LV function we will not perform a ventriculogram.

## 2011-05-24 ENCOUNTER — Telehealth: Payer: Self-pay | Admitting: *Deleted

## 2011-05-24 NOTE — Telephone Encounter (Signed)
Lm w/ bmet results.

## 2011-05-24 NOTE — Telephone Encounter (Signed)
Message copied by Lorayne Bender on Thu May 24, 2011  3:17 PM ------      Message from: [place], PETER M      Created: Wed May 23, 2011  5:26 PM       Bun and creatnine stable. Sodium is a little low.

## 2011-05-28 ENCOUNTER — Inpatient Hospital Stay (HOSPITAL_COMMUNITY)
Admission: EM | Admit: 2011-05-28 | Discharge: 2011-05-30 | DRG: 307 | Disposition: A | Discharge status: Home / Self Care | Payer: Medicare Other | Source: Ambulatory Visit | Attending: Internal Medicine | Admitting: Internal Medicine

## 2011-05-28 ENCOUNTER — Emergency Department (HOSPITAL_COMMUNITY): Payer: Medicare Other

## 2011-05-28 ENCOUNTER — Telehealth: Payer: Self-pay | Admitting: *Deleted

## 2011-05-28 DIAGNOSIS — D696 Thrombocytopenia, unspecified: Secondary | ICD-10-CM | POA: Diagnosis present

## 2011-05-28 DIAGNOSIS — I2510 Atherosclerotic heart disease of native coronary artery without angina pectoris: Secondary | ICD-10-CM | POA: Diagnosis present

## 2011-05-28 DIAGNOSIS — I359 Nonrheumatic aortic valve disorder, unspecified: Principal | ICD-10-CM | POA: Diagnosis present

## 2011-05-28 DIAGNOSIS — G4733 Obstructive sleep apnea (adult) (pediatric): Secondary | ICD-10-CM | POA: Diagnosis present

## 2011-05-28 DIAGNOSIS — Z951 Presence of aortocoronary bypass graft: Secondary | ICD-10-CM

## 2011-05-28 DIAGNOSIS — E119 Type 2 diabetes mellitus without complications: Secondary | ICD-10-CM | POA: Diagnosis present

## 2011-05-28 DIAGNOSIS — N400 Enlarged prostate without lower urinary tract symptoms: Secondary | ICD-10-CM | POA: Diagnosis present

## 2011-05-28 DIAGNOSIS — J90 Pleural effusion, not elsewhere classified: Secondary | ICD-10-CM | POA: Diagnosis present

## 2011-05-28 DIAGNOSIS — N183 Chronic kidney disease, stage 3 unspecified: Secondary | ICD-10-CM | POA: Diagnosis present

## 2011-05-28 DIAGNOSIS — I379 Nonrheumatic pulmonary valve disorder, unspecified: Secondary | ICD-10-CM | POA: Diagnosis present

## 2011-05-28 DIAGNOSIS — D869 Sarcoidosis, unspecified: Secondary | ICD-10-CM | POA: Diagnosis present

## 2011-05-28 DIAGNOSIS — I129 Hypertensive chronic kidney disease with stage 1 through stage 4 chronic kidney disease, or unspecified chronic kidney disease: Secondary | ICD-10-CM | POA: Diagnosis present

## 2011-05-28 DIAGNOSIS — R079 Chest pain, unspecified: Secondary | ICD-10-CM

## 2011-05-28 DIAGNOSIS — I509 Heart failure, unspecified: Secondary | ICD-10-CM | POA: Diagnosis present

## 2011-05-28 LAB — POCT I-STAT, CHEM 8
BUN: 55 mg/dL — ABNORMAL HIGH (ref 6–23)
Calcium, Ion: 1.13 mmol/L (ref 1.12–1.32)
Chloride: 100 mEq/L (ref 96–112)
HCT: 43 % (ref 39.0–52.0)
Sodium: 134 mEq/L — ABNORMAL LOW (ref 135–145)
TCO2: 25 mmol/L (ref 0–100)

## 2011-05-28 LAB — CBC
Hemoglobin: 12.6 g/dL — ABNORMAL LOW (ref 13.0–17.0)
Platelets: 79 10*3/uL — ABNORMAL LOW (ref 150–400)
RBC: 4.32 MIL/uL (ref 4.22–5.81)
WBC: 5.7 10*3/uL (ref 4.0–10.5)

## 2011-05-28 LAB — CK TOTAL AND CKMB (NOT AT ARMC)
CK, MB: 2.8 ng/mL (ref 0.3–4.0)
Relative Index: INVALID (ref 0.0–2.5)
Total CK: 27 U/L (ref 7–232)

## 2011-05-28 LAB — PROTIME-INR: Prothrombin Time: 16.9 seconds — ABNORMAL HIGH (ref 11.6–15.2)

## 2011-05-28 LAB — DIFFERENTIAL
Basophils Relative: 0 % (ref 0–1)
Eosinophils Absolute: 0.1 10*3/uL (ref 0.0–0.7)
Neutro Abs: 4.7 10*3/uL (ref 1.7–7.7)
Neutrophils Relative %: 82 % — ABNORMAL HIGH (ref 43–77)

## 2011-05-28 LAB — TROPONIN I: Troponin I: <0.3 ng/mL (ref <0.30)

## 2011-05-28 NOTE — Telephone Encounter (Signed)
Patient phoned this am with chest discomfort and had taken expired nitroglycerin spray and did seem to get a little relief.  Patient wanted to know if he should come here or go to the emergency department.  Spoke with Dr. Patty Sermons and he advised to have him go to ED.  Advised patient

## 2011-05-29 DIAGNOSIS — I2510 Atherosclerotic heart disease of native coronary artery without angina pectoris: Secondary | ICD-10-CM

## 2011-05-29 LAB — CARDIAC PANEL(CRET KIN+CKTOT+MB+TROPI)
CK, MB: 3.9 ng/mL (ref 0.3–4.0)
Relative Index: INVALID (ref 0.0–2.5)
Relative Index: INVALID (ref 0.0–2.5)
Relative Index: INVALID (ref 0.0–2.5)
Total CK: 34 U/L (ref 7–232)
Total CK: 27 U/L (ref 7–232)
Troponin I: 0.41 ng/mL (ref <0.30)

## 2011-05-29 LAB — BASIC METABOLIC PANEL
BUN: 53 mg/dL — ABNORMAL HIGH (ref 6–23)
CO2: 26 mEq/L (ref 19–32)
Calcium: 10 mg/dL (ref 8.4–10.5)
Glucose, Bld: 152 mg/dL — ABNORMAL HIGH (ref 70–99)
Sodium: 135 mEq/L (ref 135–145)

## 2011-05-29 LAB — CBC
MCV: 84.4 fL (ref 78.0–100.0)
Platelets: 96 10*3/uL — ABNORMAL LOW (ref 150–400)
RDW: 19.7 % — ABNORMAL HIGH (ref 11.5–15.5)
WBC: 5.6 10*3/uL (ref 4.0–10.5)

## 2011-05-29 LAB — GLUCOSE, CAPILLARY
Glucose-Capillary: 185 mg/dL — ABNORMAL HIGH (ref 70–99)
Glucose-Capillary: 263 mg/dL — ABNORMAL HIGH (ref 70–99)
Glucose-Capillary: 138 mg/dL — ABNORMAL HIGH (ref 70–99)

## 2011-05-29 LAB — LIPID PANEL
HDL: 26 mg/dL — ABNORMAL LOW (ref >39)
Triglycerides: 62 mg/dL (ref <150)
VLDL: 12 mg/dL (ref 0–40)

## 2011-05-29 LAB — HEPARIN LEVEL (UNFRACTIONATED): Heparin Unfractionated: 0.1 IU/mL — ABNORMAL LOW (ref 0.30–0.70)

## 2011-05-30 DIAGNOSIS — I359 Nonrheumatic aortic valve disorder, unspecified: Secondary | ICD-10-CM

## 2011-05-30 LAB — CBC
HCT: 36.6 % — ABNORMAL LOW (ref 39.0–52.0)
MCH: 28.4 pg (ref 26.0–34.0)
MCHC: 33.3 g/dL (ref 30.0–36.0)
MCV: 85.3 fL (ref 78.0–100.0)
RDW: 20 % — ABNORMAL HIGH (ref 11.5–15.5)

## 2011-05-30 LAB — BASIC METABOLIC PANEL
BUN: 54 mg/dL — ABNORMAL HIGH (ref 6–23)
Calcium: 9.4 mg/dL (ref 8.4–10.5)
Creatinine, Ser: 2.05 mg/dL — ABNORMAL HIGH (ref 0.4–1.5)
GFR calc non Af Amer: 32 mL/min — ABNORMAL LOW (ref >60)
Glucose, Bld: 161 mg/dL — ABNORMAL HIGH (ref 70–99)

## 2011-05-31 ENCOUNTER — Telehealth: Payer: Self-pay | Admitting: *Deleted

## 2011-05-31 NOTE — Discharge Summary (Addendum)
[name], [name]               ACCOUNT NO.:  [id number]  MEDICAL RECORD NO.:  [id number]  LOCATION:  3741                         FACILITY:  [facility]  PHYSICIAN:  Cassell Clement, M.D. DATE OF BIRTH:  07-22-1941  DATE OF ADMISSION:  05/28/2011 DATE OF DISCHARGE:  05/30/2011                              DISCHARGE SUMMARY   DISCHARGE DIAGNOSES: 1. Valvular heart disease, critical aortic stenosis and severe     pulmonary regurgitation to be scheduled for surgery by Dr. Laneta Simmers.     a.     Also had moderate aortic insufficiency, moderate tricuspid      regurgitation, pulmonary hypertension, and mild mitral      regurgitation by echocardiogram in April 2012. 2. Coronary artery disease, status post coronary artery bypass graft     in 2006.     a.     Catheterization on May 29, 2011, showed plaque in the left      main supplying only a small vascular territory and a known      occluded right coronary artery from previous studies.  He had      patent saphenous vein graft to the diagonal, patent saphenous vein      graft to obtuse marginal and distal circumflex, patent saphenous      vein graft to posterior descending artery, and left internal      mammary artery to left anterior descending, medical therapy. 3. Obstructive sleep apnea, on CPAP. 4. Sarcoidosis, initially diagnosed from hypercalcemia leading to bone     marrow biopsy and axillary node biopsy. 5. Right pleural effusion. 6. Diabetes mellitus. 7. Hypertension. 8. Benign prostatic hypertrophy. 9. Congestive heart failure. 10.Hemorrhagic cystitis, treated with Macrodantin. 11.Gout. 12.Cataract surgery. 13.Chronic renal insufficiency, stage III, with discharge of     creatinine 2.05. 14.Postoperative atrial fibrillation after his bypass surgery. 15.Thrombocytopenia with discharge platelet count of 107.  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  He was recently seen in consultation by [facility] and plan was for heart catheterization in upcoming week by Dr. [place] with possible valvular surgery plus or minus redo bypass depending on the catheterization.  However, before the catheterization was performed, he presented to [facility] with complaints of chest pain lasting approximately 45 minutes to an hour while at rest at the computer.  He tried to schedule beta-blocker and Imdur without relief as well as 4 sublingual sprays of nitroglycerin, which provided did not provide any relief.  He thus presented to the ER.  Cardiac enzymes were cycled, which did demonstrate a peak troponin 0.50, but this was in the setting of his renal insufficiency.  EKG demonstrated first-degree AV block, right bundle-branch block, and left posterior fascicular block.  Chest x- ray demonstrated enlargement of the cardiopulmonary silhouette with worsening right basal collapse and consolidation and effusion.  This was an AP view and did appear somewhat changed from before.  He was admitted to hospital with plans for cardiac catheterization in the last 24-48 hours to assess with coronaries.  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  A noncontrast CT of the chest was checked, which demonstrated large right and small left pleural effusion with associated passive atelectasis with upper abdominal ascites.  There was no evidence of pericardial effusion.  He underwent cardiac catheterization by Dr. Riley Kill, May 29, 2011, which showed the above findings, plan was for medical therapy.  The patient's digoxin was held on admission pending digoxin level given his renal insufficiency and his Lasix was discontinued while he was undergoing preparation for catheterization.  These were restarted, but a lower doses than home doses by Dr.  Patty Sermons today.  Dr. Patty Sermons has seen and examined him today, and feels he is stable for discharge.  DISCHARGE LABORATORIES:  WBC 5.9, hemoglobin 12.2, hematocrit 36.6, platelet count 107.  Sodium 136, potassium 4.7, chloride 100, CO2 of 25, glucose 161, BUN 54, creatinine 2.05.  CKs and MBs were normal, but he did have a troponin trend of less than 0.3, 0.41, 0.5, and 0.35.  Total cholesterol of 116, triglycerides 62, HDL 26, LDL 78.  Digoxin level 1.5.  STUDIES: 1. CT of the chest without contrast media, May 28, 2011, showed large     right and small left pleural effusion with associated passive     atelectasis.  Upper abdominal ascites.  Cardiomegaly with     pulmonary, aortic, and mitral annular complications.     Atherosclerosis including coronary artery, mild airway thickening     possibly from bronchitis or reactive airway disease.  No overt     edema. 2. Chest x-ray on May 28, 2011, demonstrated enlargement of the     cardiopericardial silhouette with worsening right basal     collapse/consolidation and effusion. 3. Cardiac catheterization on May 29, 2011, please see full report for     details.  DISCHARGE MEDICATIONS: 1. Lanoxin 0.25 mg half tablet every morning. 2. Lasix 80 mg b.i.d., the patient endorses taking one __________     decreased dose. 3. Aldactone 25 mg every morning. 4. Allopurinol 300 mg every morning. 5. Ambien 5 mg 1-2 tablets nightly p.r.n. 6. Aspirin 81 mg every morning. 7. Benadryl 25 mg p.r.n. cough and postnasal drip. 8. Colace 100 mg daily at bed time. 9. Diazepam 5 mg 1-2 tablets nightly p.r.n. 10.Dilaudid 2 mg 1 tablet daily as needed for pain. 11.Hytrin 10 mg every morning. 12.Imdur 120 mg daily. 13.Lopressor 50 mg b.i.d. 14.Testosterone cypionate 200 mg twice monthly.  DISPOSITION:  [name] will be discharged in stable condition at home. He is instructed to increase activity slowly.  He is to follow a low- sodium heart-healthy diet.   He may wash his cath site with soap and water, and he is to call or return if he notices any pain, swelling, bleeding, or pus.  I have put in a call to Dr. Sharee Pimple office to help arrange for his surgery.  They will contact him with this information, and the patient is instructed to call their office if he has not heard from them by tomorrow.  DURATION OF DISCHARGE ENCOUNTER:  Greater than 30 minutes including physician and PA time.     Ronie Spies, P.A.C.   ______________________________ Cassell Clement, M.D.    DD/MEDQ  D:  05/30/2011  T:  05/31/2011  Job:  [id number]  cc:   Evelene Croon, M.D.  Electronically Signed by Cassell Clement M.D. on 05/31/2011 08:32:59 AM Electronically Signed by Ronie Spies  on 06/07/2011 09:25:58 AM

## 2011-05-31 NOTE — Telephone Encounter (Signed)
Patient not sure he told Dr. Patty Sermons correctly on his Lasix dose, was taking 80 mg twice a day prior to hospitalization (thinks he told you 320 mg daily).  Was to decrease on discharge, and he was discharged on same dose.  Please advise on what dose he should be taking.

## 2011-06-01 NOTE — Telephone Encounter (Signed)
Now he should be on 40 mg twice a day instead of 80 mg twice a day

## 2011-06-01 NOTE — Telephone Encounter (Signed)
Advised wife to decrease lasix

## 2011-06-04 ENCOUNTER — Observation Stay (HOSPITAL_COMMUNITY): Admission: RE | Admit: 2011-06-04 | Payer: Self-pay | Source: Ambulatory Visit | Admitting: Cardiology

## 2011-06-04 ENCOUNTER — Telehealth: Payer: Self-pay | Admitting: Cardiology

## 2011-06-04 NOTE — Telephone Encounter (Signed)
Advised wife ok to increase lasix, but not digoxin per verbal Dr. Patty Sermons

## 2011-06-04 NOTE — Telephone Encounter (Signed)
Was scheduled to have a cardiac cath with Dr. [place] for a cath last week but since Dr. [place]il[place] was gone Dr. Riley Kill performed it. Now they are saying that he has a bed reservation when he's already had the procedure done. Please call back.

## 2011-06-04 NOTE — Telephone Encounter (Signed)
PATIENT LASIX AND DIGITALIS DOSE WAS CUT.  PATIENT HAD DIFFICULTY BREATHING AND SWELLING OVER THE WEEKEND.  WIFE WENT BACK TO LASIX TO 80 MG TWICE DAILY.  DOES PATIENT ALSO NEED INCREASED DIGITALIS BACK UP TO 25MG .?  PLEASE CALL.

## 2011-06-05 ENCOUNTER — Ambulatory Visit (INDEPENDENT_AMBULATORY_CARE_PROVIDER_SITE_OTHER): Payer: Medicare Other | Admitting: Surgery

## 2011-06-05 DIAGNOSIS — I079 Rheumatic tricuspid valve disease, unspecified: Secondary | ICD-10-CM

## 2011-06-05 DIAGNOSIS — I359 Nonrheumatic aortic valve disorder, unspecified: Secondary | ICD-10-CM

## 2011-06-06 ENCOUNTER — Telehealth: Payer: Self-pay | Admitting: Cardiology

## 2011-06-06 NOTE — Telephone Encounter (Signed)
Forwarded forms to Dr. Maple Hudson

## 2011-06-06 NOTE — Telephone Encounter (Signed)
Called wondering if Dr. Patty Sermons had filled out the certificate of medical necessities for CPAP supplies. Please call back.

## 2011-06-06 NOTE — Assessment & Plan Note (Signed)
OFFICE VISIT  [name], [name] DOB:  [birth year]                                        June 05, 2011 CHART #:  [id number]  The patient returned to my office today for discussion of the results of his recent cardiac catheterization and planning of surgery.  He has been feeling poorly overall with fatigue and shortness of breath, poor appetite.  He continues to have ascites.  Cardiac catheterization was reviewed.  This showed patent saphenous vein grafts and patent left internal mammary graft to the LAD.  Left ventriculogram was not performed due to his renal failure.  PHYSICAL EXAMINATION:  Vital Signs:  Today, blood pressure is 148/57, pulse 56 and regular, respiratory rate is 16 and unlabored.  Oxygen saturation on room air is 97%.  General:  He looks tired and scared. Cardiac:  Regular rate and rhythm with a grade 3/6 systolic murmur of aortic stenosis and a 1-2/6 diastolic murmur of aortic insufficiency. Lungs:  Decreased breath sounds in the bases.  Abdomen:  Protuberant with ascites on examination.  Extremities:  Mild peripheral edema in the lower legs.  IMPRESSION:  The patient is scheduled for aortic valve replacement and tricuspid valve repair on June 13, 2011, which is my first available date.  I will plan to use a tissue valve given his age.  I discussed the operative procedure with he and his wife including alternatives, benefits, and risks including, but not limited to bleeding, blood transfusion, infection, stroke, myocardial infarction, graft failure, heart block requiring permanent pacemaker, organ dysfunction including renal failure requiring dialysis, and death.  He understands all this and agrees to proceed with surgery.  Evelene Croon, M.D. Electronically Signed  BB/MEDQ  D:  06/05/2011  T:  06/06/2011  Job:  [id number]  cc:   [name] L. Foy [place], M.D. Cassell Clement, M.D.

## 2011-06-07 ENCOUNTER — Other Ambulatory Visit: Payer: Self-pay | Admitting: *Deleted

## 2011-06-07 DIAGNOSIS — I2510 Atherosclerotic heart disease of native coronary artery without angina pectoris: Secondary | ICD-10-CM

## 2011-06-07 MED ORDER — SPIRONOLACTONE 25 MG PO TABS: 25.0000 mg | ORAL_TABLET | Freq: Every day | ORAL | Status: DC

## 2011-06-07 NOTE — H&P (Addendum)
[name], [name]               ACCOUNT NO.:  [id number]  MEDICAL RECORD NO.:  [id number]  LOCATION:  3312                         FACILITY:  [facility]  PHYSICIAN:  Bevelyn Buckles. Alieah Brinton, MDDATE OF BIRTH:  Oct 18, 1941  DATE OF ADMISSION:  05/28/2011 DATE OF DISCHARGE:                             HISTORY & PHYSICAL   PRIMARY CARDIOLOGIST:  Cassell Clement, MD  CHIEF COMPLAINT:  Chest pain.  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  [name] was recently seen in consultation by [facility] and plan was for heart catheterization by Dr. Swaziland in the upcoming week, with possible valvular surgery plus or minus redo bypass depending on the catheterization.  [name] presents to the [facility] with complaints of chest pain lasting approximately 45 minutes to an hour today while at the computer.  [name] took his scheduled beta-blocker and Imdur without relief.  [name] also tried 4 sublingual sprays of nitroglycerin, which did not provide any relief. [name] told his wife about the pain, and she insisted on coming to the hospital.  Since coming here, his pain resolved spontaneously without intervention.  [name] describes it as a pressure-like sensation.  Cardiac enzymes done are negative x1 and EKG demonstrates sinus rhythm, first- degree AV block, right bundle and left posterior fascicular block, which is somewhat changed from prior.  His creatinine is 2.5, platelet count is 79, and chest x-ray demonstrates enlargement of the cardiopulmonary silhouette, worsening right basal collapse and consolidation and fusion.  PAST MEDICAL HISTORY: 1. CAD, status post CABG in 2006. 2. Valvular heart disease with critical AS, moderate AI, and severe TR     for a possible upcoming valvular surgery, as noted last by  echo in     April 2012 along with moderate PR, severe TR, pulmonary     hypertension, small pericardial effusion, and mild MR. 3. Obstructive sleep apnea, on CPAP. 4. Sarcoidosis, initially diagnosed with hypercalcemia leading to bone     marrow biopsy and axillary node biopsy. 5. Right pleural effusion. 6. Diabetes mellitus. 7. Hypertension. 8. BPH. 9. CHF. 10.Hemorrhagic cystitis treated with Macrodantin. 11.Gout. 12.Cataract surgery. 13.Chronic renal insufficiency, stage III with creatinine 2.2 on May 23, 2011. 14.Postop AFib after his bypass surgery. 15.Thrombocytopenia with platelet count of 78 in May 2012.  MEDICATIONS: 1. Allopurinol 300 mg daily. 2. Aspirin 81 mg daily. 3. Digoxin 0.25 mg daily. 4. Lasix 80 mg b.i.d. 5. Lopressor 50 mg b.i.d. 6. Hytrin 10 mg daily. 7. Ambien 10 mg daily. 8. Imdur 120 mg daily. 9. Aldactone 25 mg daily. 10.Valium p.r.n.  ALLERGIES:  Unable to currently clarify, but the patient record notes LATEX, PENICILLIN, MINOCIN, CODEINE, ACTOS, AMIODARONE ALTACE, LOTENSIN, INDOCIN, NORVASC, ORUDIS, ZOLOFT.  SOCIAL HISTORY:  [name] lives with his wife.  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  [name] is married.  [name] quit smoking in 1995 and earlier had smoked pipe.  [name] denies any alcohol use.  FAMILY HISTORY:  Mother died at 66 of old age.  Father  died of an MI at 46.  REVIEW OF SYSTEMS:  See HPI for pertinent positives.  All other systems reviewed and otherwise negative.  LABORATORY DATA:  WBC 5.7, hemoglobin 14.0, hematocrit 43, platelet count 79.  Sodium 134, potassium 4.45, chloride 100, BUN 55, creatinine 2.5, glucose 227.  Cardiac enzymes negative x1.  RADIOLOGY:  Chest x-ray showed enlargement of the cardiopulmonary silhouette, worsening right basal collapse/consolidation and fusion.  PHYSICAL EXAM:  VITAL SIGNS:  Temperature 98.3, pulse 76, respirations 15, blood pressure 152/44, pulse ox 96% on 2 L. GENERAL:  [name] in no acute distress. HEENT:  Normocephalic and atraumatic.  Extraocular movements intact. Clear sclerae.  Nares are without discharge. NECK:  Supple with elevated JVP and carotids with 1+ bilateral bruits. HEART:  Auscultation of the heart reveals regular rate and rhythm with 3/6 AS murmur with no audible S2, 2/6 TR murmur with an RV left. LUNGS:  Lung sounds have decreased breath sounds in the right base. ABDOMEN:  Mild ascites with liver edge below the umbilicus. EXTREMITIES:  Cool and dry with 2+ edema. NEUROLOGIC:  [name] is alert and oriented x3, responds to questions appropriately with normal affect.  ASSESSMENT/PLAN:  The patient was seen and examined by Dr. Gala Romney and myself.  [name] with a history of coronary artery disease, status post coronary artery bypass graft, sarcoidosis, chronic renal insufficiency, and most recently valvular heart disease with anticipated upcoming aortic and tricuspid valve surgery.  [name] now presents with chest pain concerning for unstable angina.  [name] will likely need a cardiac catheterization in 24-48 hours to assess with coronaries. 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  Overall, [name] is at very high risk for complications including renal failure and death.  [name] is eager to proceed.  Given increase in pericardial silhouette, we will check a noncontrast chest CT.  We will discuss with Dr. Swaziland.  Would not diurese given the impending dye load given his renal insufficiency. We will hold his Lasix today and tomorrow. [name] will be rewritten for it if appropriate post catheterization.  His dose of Aldactone will be held tomorrow.  Given his high creatinine, we will check a digoxin level and resume digoxin as appropriate.  CBC will be followed closely as we will be initiating heparin as well.     Dayna Dunn, P.A.C.   ______________________________ Bevelyn Buckles. Felisha Claytor, MD    DD/MEDQ  D:  05/28/2011  T:   05/29/2011  Job:  [id number]  cc:   Cassell Clement, M.D.  Electronically Signed by Ronie Spies  on 06/07/2011 09:25:53 AM Electronically Signed by Arvilla Meres MD on 06/28/2011 03:21:41 PM

## 2011-06-07 NOTE — Telephone Encounter (Signed)
Faxed refill to [facility]

## 2011-06-11 ENCOUNTER — Ambulatory Visit (HOSPITAL_COMMUNITY)
Admission: RE | Admit: 2011-06-11 | Discharge: 2011-06-11 | Disposition: A | Discharge status: Home / Self Care | Payer: Medicare Other | Source: Ambulatory Visit | Attending: Surgery | Admitting: Surgery

## 2011-06-11 ENCOUNTER — Telehealth: Payer: Self-pay | Admitting: Cardiology

## 2011-06-11 ENCOUNTER — Encounter (HOSPITAL_COMMUNITY)
Admission: RE | Admit: 2011-06-11 | Discharge: 2011-06-11 | Disposition: A | Discharge status: Home / Self Care | Payer: Medicare Other | Source: Ambulatory Visit | Attending: Surgery | Admitting: Surgery

## 2011-06-11 ENCOUNTER — Other Ambulatory Visit: Payer: Self-pay | Admitting: Surgery

## 2011-06-11 DIAGNOSIS — Z01810 Encounter for preprocedural cardiovascular examination: Secondary | ICD-10-CM | POA: Insufficient documentation

## 2011-06-11 DIAGNOSIS — I2510 Atherosclerotic heart disease of native coronary artery without angina pectoris: Secondary | ICD-10-CM | POA: Insufficient documentation

## 2011-06-11 DIAGNOSIS — E119 Type 2 diabetes mellitus without complications: Secondary | ICD-10-CM | POA: Insufficient documentation

## 2011-06-11 DIAGNOSIS — Z01812 Encounter for preprocedural laboratory examination: Secondary | ICD-10-CM | POA: Insufficient documentation

## 2011-06-11 DIAGNOSIS — E785 Hyperlipidemia, unspecified: Secondary | ICD-10-CM | POA: Insufficient documentation

## 2011-06-11 DIAGNOSIS — I10 Essential (primary) hypertension: Secondary | ICD-10-CM | POA: Insufficient documentation

## 2011-06-11 DIAGNOSIS — Z01811 Encounter for preprocedural respiratory examination: Secondary | ICD-10-CM | POA: Insufficient documentation

## 2011-06-11 DIAGNOSIS — I071 Rheumatic tricuspid insufficiency: Secondary | ICD-10-CM

## 2011-06-11 DIAGNOSIS — J90 Pleural effusion, not elsewhere classified: Secondary | ICD-10-CM | POA: Insufficient documentation

## 2011-06-11 DIAGNOSIS — I350 Nonrheumatic aortic (valve) stenosis: Secondary | ICD-10-CM

## 2011-06-11 DIAGNOSIS — I359 Nonrheumatic aortic valve disorder, unspecified: Secondary | ICD-10-CM

## 2011-06-11 LAB — COMPREHENSIVE METABOLIC PANEL
Albumin: 3.5 g/dL (ref 3.5–5.2)
BUN: 80 mg/dL — ABNORMAL HIGH (ref 6–23)
CO2: 24 mEq/L (ref 19–32)
Chloride: 95 mEq/L — ABNORMAL LOW (ref 96–112)
Creatinine, Ser: 2.61 mg/dL — ABNORMAL HIGH (ref 0.50–1.35)
GFR calc Af Amer: 29 mL/min — ABNORMAL LOW (ref >60)
GFR calc non Af Amer: 24 mL/min — ABNORMAL LOW (ref >60)
Total Bilirubin: 1.4 mg/dL — ABNORMAL HIGH (ref 0.3–1.2)

## 2011-06-11 LAB — URINALYSIS, ROUTINE W REFLEX MICROSCOPIC
Bilirubin Urine: NEGATIVE
Hgb urine dipstick: NEGATIVE
Ketones, ur: NEGATIVE mg/dL
Protein, ur: NEGATIVE mg/dL
Urobilinogen, UA: 0.2 mg/dL (ref 0.0–1.0)

## 2011-06-11 LAB — CBC
HCT: 37.2 % — ABNORMAL LOW (ref 39.0–52.0)
MCV: 83 fL (ref 78.0–100.0)
RDW: 18.4 % — ABNORMAL HIGH (ref 11.5–15.5)
WBC: 5.1 10*3/uL (ref 4.0–10.5)

## 2011-06-11 LAB — BLOOD GAS, ARTERIAL
Acid-Base Excess: 0.6 mmol/L (ref 0.0–2.0)
Patient temperature: 98.6
TCO2: 25.1 mmol/L (ref 0–100)
pCO2 arterial: 34 mmHg — ABNORMAL LOW (ref 35.0–45.0)

## 2011-06-11 LAB — PROTIME-INR
INR: 1.36 (ref 0.00–1.49)
Prothrombin Time: 17 seconds — ABNORMAL HIGH (ref 11.6–15.2)

## 2011-06-11 LAB — SURGICAL PCR SCREEN: Staphylococcus aureus: NEGATIVE

## 2011-06-11 LAB — ABO/RH: ABO/RH(D): O POS

## 2011-06-11 LAB — HEMOGLOBIN A1C: Hgb A1c MFr Bld: 7.1 % — ABNORMAL HIGH (ref <5.7)

## 2011-06-11 NOTE — Telephone Encounter (Signed)
Fax: [phone] [place], [place]

## 2011-06-13 ENCOUNTER — Other Ambulatory Visit: Payer: Self-pay | Admitting: Surgery

## 2011-06-13 ENCOUNTER — Inpatient Hospital Stay (HOSPITAL_COMMUNITY)
Admission: RE | Admit: 2011-06-13 | Discharge: 2011-06-23 | DRG: 220 | Disposition: A | Discharge status: Home Health | Payer: Medicare Other | Source: Ambulatory Visit | Attending: Surgery | Admitting: Surgery

## 2011-06-13 ENCOUNTER — Inpatient Hospital Stay (HOSPITAL_COMMUNITY): Payer: Medicare Other

## 2011-06-13 DIAGNOSIS — Z7982 Long term (current) use of aspirin: Secondary | ICD-10-CM

## 2011-06-13 DIAGNOSIS — G4733 Obstructive sleep apnea (adult) (pediatric): Secondary | ICD-10-CM | POA: Diagnosis present

## 2011-06-13 DIAGNOSIS — N039 Chronic nephritic syndrome with unspecified morphologic changes: Secondary | ICD-10-CM | POA: Diagnosis present

## 2011-06-13 DIAGNOSIS — I2789 Other specified pulmonary heart diseases: Secondary | ICD-10-CM | POA: Diagnosis present

## 2011-06-13 DIAGNOSIS — I079 Rheumatic tricuspid valve disease, unspecified: Secondary | ICD-10-CM | POA: Diagnosis present

## 2011-06-13 DIAGNOSIS — I4891 Unspecified atrial fibrillation: Secondary | ICD-10-CM | POA: Diagnosis not present

## 2011-06-13 DIAGNOSIS — N179 Acute kidney failure, unspecified: Secondary | ICD-10-CM | POA: Diagnosis not present

## 2011-06-13 DIAGNOSIS — I129 Hypertensive chronic kidney disease with stage 1 through stage 4 chronic kidney disease, or unspecified chronic kidney disease: Secondary | ICD-10-CM | POA: Diagnosis present

## 2011-06-13 DIAGNOSIS — I509 Heart failure, unspecified: Secondary | ICD-10-CM | POA: Diagnosis present

## 2011-06-13 DIAGNOSIS — N183 Chronic kidney disease, stage 3 unspecified: Secondary | ICD-10-CM | POA: Diagnosis present

## 2011-06-13 DIAGNOSIS — Z91040 Latex allergy status: Secondary | ICD-10-CM

## 2011-06-13 DIAGNOSIS — D62 Acute posthemorrhagic anemia: Secondary | ICD-10-CM | POA: Diagnosis not present

## 2011-06-13 DIAGNOSIS — R339 Retention of urine, unspecified: Secondary | ICD-10-CM | POA: Diagnosis not present

## 2011-06-13 DIAGNOSIS — Z880 Allergy status to penicillin: Secondary | ICD-10-CM

## 2011-06-13 DIAGNOSIS — E119 Type 2 diabetes mellitus without complications: Secondary | ICD-10-CM | POA: Diagnosis present

## 2011-06-13 DIAGNOSIS — R188 Other ascites: Secondary | ICD-10-CM | POA: Diagnosis present

## 2011-06-13 DIAGNOSIS — I359 Nonrheumatic aortic valve disorder, unspecified: Principal | ICD-10-CM | POA: Diagnosis present

## 2011-06-13 DIAGNOSIS — Z951 Presence of aortocoronary bypass graft: Secondary | ICD-10-CM

## 2011-06-13 DIAGNOSIS — D869 Sarcoidosis, unspecified: Secondary | ICD-10-CM | POA: Diagnosis present

## 2011-06-13 DIAGNOSIS — E876 Hypokalemia: Secondary | ICD-10-CM | POA: Diagnosis not present

## 2011-06-13 DIAGNOSIS — D631 Anemia in chronic kidney disease: Secondary | ICD-10-CM | POA: Diagnosis present

## 2011-06-13 DIAGNOSIS — I495 Sick sinus syndrome: Secondary | ICD-10-CM | POA: Diagnosis not present

## 2011-06-13 DIAGNOSIS — I2510 Atherosclerotic heart disease of native coronary artery without angina pectoris: Secondary | ICD-10-CM | POA: Diagnosis present

## 2011-06-13 DIAGNOSIS — M109 Gout, unspecified: Secondary | ICD-10-CM | POA: Diagnosis present

## 2011-06-13 HISTORY — PX: OTHER SURGICAL HISTORY: SHX169

## 2011-06-13 LAB — POCT I-STAT GLUCOSE
Glucose, Bld: 156 mg/dL — ABNORMAL HIGH (ref 70–99)
Glucose, Bld: 128 mg/dL — ABNORMAL HIGH (ref 70–99)
Operator id: 3342
Operator id: 3342

## 2011-06-13 LAB — CBC
HCT: 32.8 % — ABNORMAL LOW (ref 39.0–52.0)
Hemoglobin: 11.4 g/dL — ABNORMAL LOW (ref 13.0–17.0)
Hemoglobin: 11 g/dL — ABNORMAL LOW (ref 13.0–17.0)
MCH: 29 pg (ref 26.0–34.0)
MCH: 28.1 pg (ref 26.0–34.0)
MCV: 83.5 fL (ref 78.0–100.0)
MCV: 82.9 fL (ref 78.0–100.0)
Platelets: 102 10*3/uL — ABNORMAL LOW (ref 150–400)
RBC: 3.93 MIL/uL — ABNORMAL LOW (ref 4.22–5.81)
RBC: 3.92 MIL/uL — ABNORMAL LOW (ref 4.22–5.81)
WBC: 9.4 10*3/uL (ref 4.0–10.5)

## 2011-06-13 LAB — POCT I-STAT, CHEM 8
BUN: 67 mg/dL — ABNORMAL HIGH (ref 6–23)
Calcium, Ion: 1.22 mmol/L (ref 1.12–1.32)
Chloride: 109 mEq/L (ref 96–112)
Creatinine, Ser: 2.8 mg/dL — ABNORMAL HIGH (ref 0.50–1.35)

## 2011-06-13 LAB — POCT I-STAT 4, (NA,K, GLUC, HGB,HCT)
Glucose, Bld: 151 mg/dL — ABNORMAL HIGH (ref 70–99)
Glucose, Bld: 121 mg/dL — ABNORMAL HIGH (ref 70–99)
Glucose, Bld: 105 mg/dL — ABNORMAL HIGH (ref 70–99)
Glucose, Bld: 76 mg/dL (ref 70–99)
HCT: 38 % — ABNORMAL LOW (ref 39.0–52.0)
HCT: 28 % — ABNORMAL LOW (ref 39.0–52.0)
HCT: 27 % — ABNORMAL LOW (ref 39.0–52.0)
HCT: 31 % — ABNORMAL LOW (ref 39.0–52.0)
Hemoglobin: 12.9 g/dL — ABNORMAL LOW (ref 13.0–17.0)
Hemoglobin: 10.5 g/dL — ABNORMAL LOW (ref 13.0–17.0)
Potassium: 4.8 mEq/L (ref 3.5–5.1)
Potassium: 4.2 mEq/L (ref 3.5–5.1)
Potassium: 4.1 mEq/L (ref 3.5–5.1)
Sodium: 137 mEq/L (ref 135–145)
Sodium: 134 mEq/L — ABNORMAL LOW (ref 135–145)
Sodium: 136 mEq/L (ref 135–145)
Sodium: 138 mEq/L (ref 135–145)

## 2011-06-13 LAB — POCT I-STAT 3, ART BLOOD GAS (G3+)
Acid-base deficit: 3 mmol/L — ABNORMAL HIGH (ref 0.0–2.0)
O2 Saturation: 100 %
O2 Saturation: 95 %
pCO2 arterial: 37 mmHg (ref 35.0–45.0)
pCO2 arterial: 36.4 mmHg (ref 35.0–45.0)
pH, Arterial: 7.41 (ref 7.350–7.450)
pH, Arterial: 7.355 (ref 7.350–7.450)
pO2, Arterial: 327 mmHg — ABNORMAL HIGH (ref 80.0–100.0)
pO2, Arterial: 71 mmHg — ABNORMAL LOW (ref 80.0–100.0)

## 2011-06-13 LAB — POCT I-STAT 3, VENOUS BLOOD GAS (G3P V)
Bicarbonate: 23.2 mEq/L (ref 20.0–24.0)
TCO2: 25 mmol/L (ref 0–100)
pCO2, Ven: 44.2 mmHg — ABNORMAL LOW (ref 45.0–50.0)
pH, Ven: 7.328 — ABNORMAL HIGH (ref 7.250–7.300)

## 2011-06-13 LAB — FIBRINOGEN: Fibrinogen: 185 mg/dL — ABNORMAL LOW (ref 204–475)

## 2011-06-13 LAB — GLUCOSE, CAPILLARY: Glucose-Capillary: 160 mg/dL — ABNORMAL HIGH (ref 70–99)

## 2011-06-13 LAB — PROTIME-INR: Prothrombin Time: 22.9 seconds — ABNORMAL HIGH (ref 11.6–15.2)

## 2011-06-13 LAB — CREATININE, SERUM: Creatinine, Ser: 2.49 mg/dL — ABNORMAL HIGH (ref 0.50–1.35)

## 2011-06-13 LAB — MAGNESIUM: Magnesium: 2.9 mg/dL — ABNORMAL HIGH (ref 1.5–2.5)

## 2011-06-14 ENCOUNTER — Inpatient Hospital Stay (HOSPITAL_COMMUNITY): Payer: Medicare Other

## 2011-06-14 ENCOUNTER — Other Ambulatory Visit (HOSPITAL_COMMUNITY): Payer: Medicare Other

## 2011-06-14 LAB — GLUCOSE, CAPILLARY
Glucose-Capillary: 100 mg/dL — ABNORMAL HIGH (ref 70–99)
Glucose-Capillary: 110 mg/dL — ABNORMAL HIGH (ref 70–99)
Glucose-Capillary: 111 mg/dL — ABNORMAL HIGH (ref 70–99)
Glucose-Capillary: 119 mg/dL — ABNORMAL HIGH (ref 70–99)
Glucose-Capillary: 120 mg/dL — ABNORMAL HIGH (ref 70–99)
Glucose-Capillary: 128 mg/dL — ABNORMAL HIGH (ref 70–99)
Glucose-Capillary: 128 mg/dL — ABNORMAL HIGH (ref 70–99)
Glucose-Capillary: 91 mg/dL (ref 70–99)
Glucose-Capillary: 140 mg/dL — ABNORMAL HIGH (ref 70–99)
Glucose-Capillary: 100 mg/dL — ABNORMAL HIGH (ref 70–99)
Glucose-Capillary: 95 mg/dL (ref 70–99)
Glucose-Capillary: 80 mg/dL (ref 70–99)
Glucose-Capillary: 83 mg/dL (ref 70–99)
Glucose-Capillary: 85 mg/dL (ref 70–99)
Glucose-Capillary: 89 mg/dL (ref 70–99)
Glucose-Capillary: 95 mg/dL (ref 70–99)

## 2011-06-14 LAB — POCT I-STAT 3, ART BLOOD GAS (G3+)
Acid-base deficit: 6 mmol/L — ABNORMAL HIGH (ref 0.0–2.0)
Bicarbonate: 19.3 mEq/L — ABNORMAL LOW (ref 20.0–24.0)
Bicarbonate: 19 meq/L — ABNORMAL LOW (ref 20.0–24.0)
O2 Saturation: 98 %
Patient temperature: 98.1
Patient temperature: 35.3
TCO2: 20 mmol/L (ref 0–100)
pCO2 arterial: 32.6 mmHg — ABNORMAL LOW (ref 35.0–45.0)
pH, Arterial: 7.376 (ref 7.350–7.450)
pH, Arterial: 7.364 (ref 7.350–7.450)
pO2, Arterial: 99 mmHg (ref 80.0–100.0)
pO2, Arterial: 95 mmHg (ref 80.0–100.0)

## 2011-06-14 LAB — PREPARE PLATELET PHERESIS: Unit division: 0

## 2011-06-14 LAB — PREPARE FRESH FROZEN PLASMA
Unit division: 0
Unit division: 0

## 2011-06-14 LAB — CBC
HCT: 30.4 % — ABNORMAL LOW (ref 39.0–52.0)
HCT: 25.4 % — ABNORMAL LOW (ref 39.0–52.0)
Hemoglobin: 8.7 g/dL — ABNORMAL LOW (ref 13.0–17.0)
MCH: 28.3 pg (ref 26.0–34.0)
MCHC: 34.3 g/dL (ref 30.0–36.0)
MCV: 82.7 fL (ref 78.0–100.0)
Platelets: 69 K/uL — ABNORMAL LOW (ref 150–400)
RBC: 3.07 MIL/uL — ABNORMAL LOW (ref 4.22–5.81)
RDW: 18.1 % — ABNORMAL HIGH (ref 11.5–15.5)
RDW: 18.7 % — ABNORMAL HIGH (ref 11.5–15.5)
WBC: 12.5 10*3/uL — ABNORMAL HIGH (ref 4.0–10.5)
WBC: 9.4 K/uL (ref 4.0–10.5)

## 2011-06-14 LAB — BASIC METABOLIC PANEL
CO2: 19 mEq/L (ref 19–32)
Calcium: 8.4 mg/dL (ref 8.4–10.5)
Creatinine, Ser: 2.5 mg/dL — ABNORMAL HIGH (ref 0.50–1.35)
GFR calc Af Amer: 31 mL/min — ABNORMAL LOW (ref >60)
GFR calc non Af Amer: 26 mL/min — ABNORMAL LOW (ref >60)
Sodium: 136 mEq/L (ref 135–145)

## 2011-06-14 LAB — MAGNESIUM: Magnesium: 2.7 mg/dL — ABNORMAL HIGH (ref 1.5–2.5)

## 2011-06-14 LAB — POCT I-STAT, CHEM 8
Calcium, Ion: 1.25 mmol/L (ref 1.12–1.32)
Creatinine, Ser: 2.9 mg/dL — ABNORMAL HIGH (ref 0.50–1.35)
Glucose, Bld: 84 mg/dL (ref 70–99)
HCT: 27 % — ABNORMAL LOW (ref 39.0–52.0)
Hemoglobin: 9.2 g/dL — ABNORMAL LOW (ref 13.0–17.0)
TCO2: 21 mmol/L (ref 0–100)

## 2011-06-14 LAB — CREATININE, URINE, RANDOM: Creatinine, Urine: 90.31 mg/dL

## 2011-06-14 LAB — CREATININE, SERUM
GFR calc Af Amer: 32 mL/min — ABNORMAL LOW (ref >60)
GFR calc non Af Amer: 26 mL/min — ABNORMAL LOW (ref >60)

## 2011-06-15 ENCOUNTER — Inpatient Hospital Stay (HOSPITAL_COMMUNITY): Payer: Medicare Other

## 2011-06-15 LAB — RENAL FUNCTION PANEL
BUN: 74 mg/dL — ABNORMAL HIGH (ref 6–23)
CO2: 23 mEq/L (ref 19–32)
Calcium: 8.5 mg/dL (ref 8.4–10.5)
GFR calc Af Amer: 28 mL/min — ABNORMAL LOW (ref >60)
Glucose, Bld: 105 mg/dL — ABNORMAL HIGH (ref 70–99)
Sodium: 135 mEq/L (ref 135–145)

## 2011-06-15 LAB — GLUCOSE, CAPILLARY
Glucose-Capillary: 123 mg/dL — ABNORMAL HIGH (ref 70–99)
Glucose-Capillary: 99 mg/dL (ref 70–99)
Glucose-Capillary: 103 mg/dL — ABNORMAL HIGH (ref 70–99)

## 2011-06-15 LAB — C3 COMPLEMENT: C3 Complement: 58 mg/dL — ABNORMAL LOW (ref 90–180)

## 2011-06-15 LAB — CBC
HCT: 24.4 % — ABNORMAL LOW (ref 39.0–52.0)
Hemoglobin: 8.4 g/dL — ABNORMAL LOW (ref 13.0–17.0)
MCH: 28.7 pg (ref 26.0–34.0)
MCV: 83.3 fL (ref 78.0–100.0)
RBC: 2.93 MIL/uL — ABNORMAL LOW (ref 4.22–5.81)

## 2011-06-15 LAB — KAPPA/LAMBDA LIGHT CHAINS
Kappa, lambda light chain ratio: 1.06 (ref 0.26–1.65)
Lambda free light chains: 3.12 mg/dL — ABNORMAL HIGH (ref 0.57–2.63)

## 2011-06-15 LAB — ANA: Anti Nuclear Antibody(ANA): POSITIVE — AB

## 2011-06-15 LAB — ANTI-NUCLEAR AB-TITER (ANA TITER): ANA Titer 1: 1:320 {titer} — ABNORMAL HIGH

## 2011-06-16 LAB — GLUCOSE, CAPILLARY
Glucose-Capillary: 73 mg/dL (ref 70–99)
Glucose-Capillary: 79 mg/dL (ref 70–99)
Glucose-Capillary: 76 mg/dL (ref 70–99)
Glucose-Capillary: 200 mg/dL — ABNORMAL HIGH (ref 70–99)

## 2011-06-16 LAB — CBC
HCT: 23.7 % — ABNORMAL LOW (ref 39.0–52.0)
Hemoglobin: 8.1 g/dL — ABNORMAL LOW (ref 13.0–17.0)
WBC: 8.5 10*3/uL (ref 4.0–10.5)

## 2011-06-16 LAB — RENAL FUNCTION PANEL
BUN: 76 mg/dL — ABNORMAL HIGH (ref 6–23)
CO2: 23 mEq/L (ref 19–32)
Chloride: 102 mEq/L (ref 96–112)
Glucose, Bld: 69 mg/dL — ABNORMAL LOW (ref 70–99)
Potassium: 4.5 mEq/L (ref 3.5–5.1)

## 2011-06-16 LAB — FERRITIN: Ferritin: 160 ng/mL (ref 22–322)

## 2011-06-16 LAB — IRON AND TIBC: Iron: 16 ug/dL — ABNORMAL LOW (ref 42–135)

## 2011-06-16 LAB — FOLATE: Folate: 7.5 ng/mL

## 2011-06-17 ENCOUNTER — Inpatient Hospital Stay (HOSPITAL_COMMUNITY): Payer: Medicare Other

## 2011-06-17 LAB — TYPE AND SCREEN
ABO/RH(D): O POS
Antibody Screen: NEGATIVE
Unit division: 0
Unit division: 0
Unit division: 0
Unit division: 0

## 2011-06-17 LAB — GLUCOSE, CAPILLARY
Glucose-Capillary: 84 mg/dL (ref 70–99)
Glucose-Capillary: 126 mg/dL — ABNORMAL HIGH (ref 70–99)
Glucose-Capillary: 124 mg/dL — ABNORMAL HIGH (ref 70–99)
Glucose-Capillary: 104 mg/dL — ABNORMAL HIGH (ref 70–99)

## 2011-06-17 LAB — CBC
Hemoglobin: 8.5 g/dL — ABNORMAL LOW (ref 13.0–17.0)
MCHC: 34.3 g/dL (ref 30.0–36.0)
RDW: 18.4 % — ABNORMAL HIGH (ref 11.5–15.5)

## 2011-06-17 LAB — RENAL FUNCTION PANEL
Albumin: 2.7 g/dL — ABNORMAL LOW (ref 3.5–5.2)
GFR calc Af Amer: 32 mL/min — ABNORMAL LOW (ref >60)
GFR calc non Af Amer: 27 mL/min — ABNORMAL LOW (ref >60)
Glucose, Bld: 77 mg/dL (ref 70–99)
Phosphorus: 4.2 mg/dL (ref 2.3–4.6)
Potassium: 3.6 mEq/L (ref 3.5–5.1)
Sodium: 133 mEq/L — ABNORMAL LOW (ref 135–145)

## 2011-06-17 LAB — PROTIME-INR: INR: 1.37 (ref 0.00–1.49)

## 2011-06-18 ENCOUNTER — Inpatient Hospital Stay (HOSPITAL_COMMUNITY): Payer: Medicare Other

## 2011-06-18 DIAGNOSIS — I4891 Unspecified atrial fibrillation: Secondary | ICD-10-CM

## 2011-06-18 LAB — COMPREHENSIVE METABOLIC PANEL
ALT: 6 U/L (ref 0–53)
AST: 16 U/L (ref 0–37)
BUN: 68 mg/dL — ABNORMAL HIGH (ref 6–23)
CO2: 27 mEq/L (ref 19–32)
Calcium: 8.2 mg/dL — ABNORMAL LOW (ref 8.4–10.5)
Chloride: 101 mEq/L (ref 96–112)
GFR calc Af Amer: 32 mL/min — ABNORMAL LOW (ref >60)
Potassium: 3.4 mEq/L — ABNORMAL LOW (ref 3.5–5.1)
Total Protein: 5.2 g/dL — ABNORMAL LOW (ref 6.0–8.3)

## 2011-06-18 LAB — PROTEIN ELECTROPH W RFLX QUANT IMMUNOGLOBULINS
Alpha-2-Globulin: 8.8 % (ref 7.1–11.8)
M-Spike, %: NOT DETECTED g/dL
Total Protein ELP: 4.7 g/dL — ABNORMAL LOW (ref 6.0–8.3)

## 2011-06-18 LAB — GLUCOSE, CAPILLARY
Glucose-Capillary: 108 mg/dL — ABNORMAL HIGH (ref 70–99)
Glucose-Capillary: 126 mg/dL — ABNORMAL HIGH (ref 70–99)
Glucose-Capillary: 70 mg/dL (ref 70–99)

## 2011-06-18 LAB — APTT: aPTT: 39 seconds — ABNORMAL HIGH (ref 24–37)

## 2011-06-18 LAB — CBC
HCT: 23.1 % — ABNORMAL LOW (ref 39.0–52.0)
Hemoglobin: 7.8 g/dL — ABNORMAL LOW (ref 13.0–17.0)
MCV: 82.8 fL (ref 78.0–100.0)
RBC: 2.79 MIL/uL — ABNORMAL LOW (ref 4.22–5.81)
WBC: 7.3 10*3/uL (ref 4.0–10.5)

## 2011-06-18 LAB — PTH, INTACT AND CALCIUM: PTH: 116.9 pg/mL — ABNORMAL HIGH (ref 14.0–72.0)

## 2011-06-19 ENCOUNTER — Inpatient Hospital Stay (HOSPITAL_COMMUNITY): Payer: Medicare Other

## 2011-06-19 LAB — CBC
MCH: 28.5 pg (ref 26.0–34.0)
MCHC: 34.2 g/dL (ref 30.0–36.0)
Platelets: 128 10*3/uL — ABNORMAL LOW (ref 150–400)
RDW: 18.1 % — ABNORMAL HIGH (ref 11.5–15.5)

## 2011-06-19 LAB — RENAL FUNCTION PANEL
Albumin: 2.5 g/dL — ABNORMAL LOW (ref 3.5–5.2)
BUN: 67 mg/dL — ABNORMAL HIGH (ref 6–23)
Creatinine, Ser: 2.4 mg/dL — ABNORMAL HIGH (ref 0.50–1.35)
Phosphorus: 3.7 mg/dL (ref 2.3–4.6)

## 2011-06-19 LAB — GLUCOSE, CAPILLARY
Glucose-Capillary: 66 mg/dL — ABNORMAL LOW (ref 70–99)
Glucose-Capillary: 206 mg/dL — ABNORMAL HIGH (ref 70–99)
Glucose-Capillary: 118 mg/dL — ABNORMAL HIGH (ref 70–99)

## 2011-06-19 LAB — BASIC METABOLIC PANEL
BUN: 65 mg/dL — ABNORMAL HIGH (ref 6–23)
BUN: 65 mg/dL — ABNORMAL HIGH (ref 6–23)
CO2: 27 mEq/L (ref 19–32)
Calcium: 8.4 mg/dL (ref 8.4–10.5)
Chloride: 102 mEq/L (ref 96–112)
Creatinine, Ser: 2.28 mg/dL — ABNORMAL HIGH (ref 0.50–1.35)
Creatinine, Ser: 2.29 mg/dL — ABNORMAL HIGH (ref 0.50–1.35)
GFR calc Af Amer: 34 mL/min — ABNORMAL LOW (ref >60)
GFR calc Af Amer: 34 mL/min — ABNORMAL LOW (ref >60)
GFR calc non Af Amer: 28 mL/min — ABNORMAL LOW (ref >60)
GFR calc non Af Amer: 28 mL/min — ABNORMAL LOW (ref >60)
Glucose, Bld: 220 mg/dL — ABNORMAL HIGH (ref 70–99)
Glucose, Bld: 170 mg/dL — ABNORMAL HIGH (ref 70–99)
Potassium: 3.2 mEq/L — ABNORMAL LOW (ref 3.5–5.1)
Potassium: 3.7 mEq/L (ref 3.5–5.1)
Sodium: 136 mEq/L (ref 135–145)

## 2011-06-19 LAB — PROTIME-INR: Prothrombin Time: 17.3 seconds — ABNORMAL HIGH (ref 11.6–15.2)

## 2011-06-20 ENCOUNTER — Inpatient Hospital Stay (HOSPITAL_COMMUNITY): Payer: Medicare Other

## 2011-06-20 LAB — CBC
HCT: 22.9 % — ABNORMAL LOW (ref 39.0–52.0)
Hemoglobin: 7.5 g/dL — ABNORMAL LOW (ref 13.0–17.0)
MCH: 27.8 pg (ref 26.0–34.0)
RBC: 2.7 MIL/uL — ABNORMAL LOW (ref 4.22–5.81)

## 2011-06-20 LAB — GLUCOSE, CAPILLARY
Glucose-Capillary: 97 mg/dL (ref 70–99)
Glucose-Capillary: 138 mg/dL — ABNORMAL HIGH (ref 70–99)
Glucose-Capillary: 189 mg/dL — ABNORMAL HIGH (ref 70–99)
Glucose-Capillary: 238 mg/dL — ABNORMAL HIGH (ref 70–99)

## 2011-06-20 LAB — RENAL FUNCTION PANEL
BUN: 62 mg/dL — ABNORMAL HIGH (ref 6–23)
CO2: 29 mEq/L (ref 19–32)
Calcium: 8.3 mg/dL — ABNORMAL LOW (ref 8.4–10.5)
GFR calc Af Amer: 38 mL/min — ABNORMAL LOW (ref >60)
Glucose, Bld: 90 mg/dL (ref 70–99)
Sodium: 137 mEq/L (ref 135–145)

## 2011-06-21 ENCOUNTER — Inpatient Hospital Stay (HOSPITAL_COMMUNITY): Payer: Medicare Other

## 2011-06-21 LAB — BASIC METABOLIC PANEL
BUN: 56 mg/dL — ABNORMAL HIGH (ref 6–23)
CO2: 29 mEq/L (ref 19–32)
Calcium: 7.2 mg/dL — ABNORMAL LOW (ref 8.4–10.5)
Creatinine, Ser: 1.82 mg/dL — ABNORMAL HIGH (ref 0.50–1.35)
Glucose, Bld: 79 mg/dL (ref 70–99)

## 2011-06-21 LAB — GLUCOSE, CAPILLARY: Glucose-Capillary: 123 mg/dL — ABNORMAL HIGH (ref 70–99)

## 2011-06-22 ENCOUNTER — Inpatient Hospital Stay (HOSPITAL_COMMUNITY): Payer: Medicare Other

## 2011-06-22 LAB — GLUCOSE, CAPILLARY
Glucose-Capillary: 64 mg/dL — ABNORMAL LOW (ref 70–99)
Glucose-Capillary: 98 mg/dL (ref 70–99)
Glucose-Capillary: 155 mg/dL — ABNORMAL HIGH (ref 70–99)
Glucose-Capillary: 157 mg/dL — ABNORMAL HIGH (ref 70–99)

## 2011-06-22 LAB — BASIC METABOLIC PANEL
BUN: 49 mg/dL — ABNORMAL HIGH (ref 6–23)
GFR calc Af Amer: 55 mL/min — ABNORMAL LOW (ref >60)
GFR calc non Af Amer: 45 mL/min — ABNORMAL LOW (ref >60)
Potassium: 3.6 mEq/L (ref 3.5–5.1)
Sodium: 136 mEq/L (ref 135–145)

## 2011-06-22 LAB — PROTIME-INR
INR: 2.42 — ABNORMAL HIGH (ref 0.00–1.49)
Prothrombin Time: 26.7 seconds — ABNORMAL HIGH (ref 11.6–15.2)

## 2011-06-23 DIAGNOSIS — I495 Sick sinus syndrome: Secondary | ICD-10-CM

## 2011-06-23 LAB — GLUCOSE, CAPILLARY

## 2011-06-23 LAB — PROTIME-INR: Prothrombin Time: 26.9 seconds — ABNORMAL HIGH (ref 11.6–15.2)

## 2011-06-25 ENCOUNTER — Other Ambulatory Visit: Payer: Self-pay | Admitting: Cardiology

## 2011-06-25 DIAGNOSIS — E876 Hypokalemia: Secondary | ICD-10-CM

## 2011-06-25 LAB — PROTIME-INR: INR: 2.5 — AB (ref <=1.1)

## 2011-06-25 LAB — POCT INR: INR: 2.52

## 2011-06-25 MED ORDER — POTASSIUM BICARBONATE 25 MEQ PO TBEF: 25.0000 meq | EFFERVESCENT_TABLET | Freq: Two times a day (BID) | ORAL | Status: DC

## 2011-06-25 NOTE — Telephone Encounter (Signed)
Patient phoned stating that K+ is causing severe diarrhea, to the point he becomes incontinent with it. Daily total is 40 meq.  Would like to try either liquid K+ and dilute it or try aldactone or triamterene.  Took only 10 meq of K+ this am and reacted within 40 min.  Takes lasix 80 mg bid and does have peripheral edema in feet and ankles, bilaterally.  Please advise

## 2011-06-25 NOTE — Telephone Encounter (Deleted)
Called in today potassium Chloride

## 2011-06-25 NOTE — Telephone Encounter (Signed)
After discussing further with Dr Patty Sermons, will hold potassium and just add spironolactone 25 daily and have home health check bmet on 06/28/11

## 2011-06-25 NOTE — Telephone Encounter (Signed)
Try spironolactone 25 mg one daily and decrease his potassium to potassium chloride elixir 20 mEq once a day.  Have the home health nurse draw a basal metabolic panel at the end of this week (Thursday]

## 2011-06-26 ENCOUNTER — Telehealth: Payer: Self-pay | Admitting: *Deleted

## 2011-06-26 NOTE — Telephone Encounter (Signed)
Potassium signed in error.  Spoke with patient and pharmacy, will not be taking

## 2011-06-26 NOTE — Consult Note (Signed)
[name], [name] NO.:  [id number]  MEDICAL RECORD NO.:  [id number]  LOCATION:  2302                         FACILITY:  [facility]  PHYSICIAN:  Zetta Bills, MD          DATE OF BIRTH:  1941-06-01  DATE OF CONSULTATION:  06/14/2011 DATE OF DISCHARGE:                                CONSULTATION   REFERRING PHYSICIAN:  Nephrology is consulted by [name] Croon, MD of [facility] for the evaluation and management of [name] acute renal failure on chronic kidney disease, stage III.  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  From a review of records, he also has underlying type 2 diabetes mellitus with associated hypertension and benign prostatic hypertrophy.  Records also indicate that he has a creatinine at baseline for the last 2 years that has straddled from 1.8 to 2.0 and according to his wife, this has been progressive elevation over the last 10 months or so.  Of note, around 10 months ago, he was using large amounts of NSAIDs to the tune of 2.4-3.2 g of ibuprofen per day on a nearly daily basis.  Preceding this, he also has a diagnosis of sarcoidosis which was diagnosed about 3 years ago from bone marrow biopsy and lymph node biopsy.  This has been on treatment with corticosteroids.  He denies any obstructive or irritative urinary symptoms and states that he had an isolated episode of hematuria due to hemorrhagic cystitis a few months ago that was treated by oral Macrodantin therapy.  He denies any history of kidney stones, denies any recurrent episodes of nausea, vomiting, or diarrhea.  He does have benign prostatic hypertrophy that at some point require transurethral resection, an episode  complicated by cardiac arrest.  He denies any recent NSAID use since the recognition of his elevated creatinine and does not have any prior history of acute renal insufficiency.  He underwent his aortic valve replacement yesterday and the intraoperative sheet is remarkable for vocational hypotensive episodes, treated by vasoactive therapy.  Total bypass time was noted to be 218 minutes according to the intraoperative records.  Since surgery yesterday, he has maintained a fairly good urine output, producing about 1.5 liters of urine over the last 24 hours.  Other losses also noted through his chest tube and an NG tube.  PAST MEDICAL HISTORY: 1. Chronic kidney disease, stage III (baseline creatinine averaging     about 2.0 for the last year). 2. Severe aortic stenosis with moderate aortic insufficiency status     post aortic valve replacement. 3. Congestive heart failure secondary to problem #1. 4. Coronary artery disease status post quintuple vessel bypass     surgery. 5. Sarcoidosis, currently in clinical remission. 6. History of hypercalcemia related to sarcoidosis. 7. Obstructive sleep apnea, on CPAP therapy. 8. Type 2 diabetes mellitus. 9. Hypertension. 10.Benign prostatic hypertrophy. 11.History of hemorrhagic cystitis, treated by oral nitrofurantoin 12.Gout. 13.History of cataract repair. 14.History of atrial fibrillation postoperatively following his  bypass. 15.History of chronic thrombocytopenia.  MEDICATIONS:  Prior to his admission to the hospital, he was taking; 1. Metoprolol 50 mg b.i.d. 2. Furosemide 80 mg b.i.d. 3. Digoxin 0.25 mg daily. 4. Ambien 5-10 mg q.h.s. p.r.n. 5. Imdur 120 mg daily. 6. Terazosin 10 mg at bedtime. 7. Aspirin 81 mg p.o. daily. 8. Allopurinol 300 mg daily. 9. Aldactone 25 mg p.o. daily. 10.P.r.n. Colace. 11.P.r.n. Dilaudid. 12.Azithromycin to complete on June 09, 2011.  Currently here in the hospital, he is on; 1. Dopamine  drip at 3 mcg a minute. 2. Lopressor 12.5 mg p.o. b.i.d. 3. Aspirin 325 mg daily. 4. Vancomycin per surgical protocol. 5. Tylenol p.r.n. 6. Protonix 40 mg p.o. daily. 7. Allopurinol 300 mg daily. 8. He has just been weaned off his Levophed drip.  ALLERGIES: 1. PENICILLINS causes skin rash. 2. LATEX causes local skin reaction. 3. MINOCYCLINE causes dizziness. 4. AMIODARONE causes diarrhea. 5. ACTOS causes swelling/exacerbates his heart failure. 6. CODEINE causes altered mental status.  SOCIAL HISTORY:  He is married and lives with his wife at home.  He is a retired Development worker, community.  Quit smoking in 1995 and prior to this smoked tobacco pipe.  Denies any alcohol use.  FAMILY HISTORY:  Significant for his mother who died at the age of 22, of age-related causes.  Father died of a myocardial infarction at 59. No significant history of kidney disease in the family.  REVIEW OF SYSTEMS:  See HPI for the pertinent positives.  All the other 14 systems reviewed and were negative.  PHYSICAL EXAMINATION:  VITAL SIGNS:  Temperature 98.4 degrees Fahrenheit, blood pressure 127/53, heart rate of 89 beats per minute, respiratory rate of 15, oxygen saturation 95% on 3 liters via nasal cannula. GENERAL:  Elderly, well-kempt Caucasian man who is somewhat somnolent throughout the interview, resting comfortably in bed. HEAD, NECK, AND ENT:  Head appears normocephalic and atraumatic.  Pupils are bilaterally equal and reactive to light. NECK:  Supple without any obvious JVD or goiter.  Right IJ central venous access is noted. CARDIOVASCULAR:  Mechanical S2 is prominent and heard over the entire precordium.  No other murmurs or rubs audible. RESPIRATORY:  Diminished breath sounds bilaterally.  No wheeze, no rales, or retractions. ABDOMEN:  Soft, obese, nontender without any organomegaly and bowel sounds are present. EXTREMITIES:  Trace edema is present over both lower extremities with venous status  dermatitis changes.  LABS:  Sodium 136, potassium 4.2, bicarbonate 19, BUN 74, creatinine 2.5, glucose 120, calcium 8.4, magnesium 2.7.  Hemoglobin 10.5, hematocrit 30.4, white cell count 12,500, platelet count 87,000. Arterial blood gas earlier today showed a pH of 7.37, PCO2 of 32, PO2 of 99, bicarbonate of 19 that was on FiO2 of 40%.  ASSESSMENT AND PLAN: 1. Acute renal failure on chronic kidney disease, stage III.  The     patient is currently nonoliguric on dopamine therapy and off     diuretics.  His acute renal insufficiency is likely the result of     the hemodynamic effects with probable evolution to ischemic acute     tubular necrosis following his aortic valve repair and     intraoperative hypotension with associated renal hypoperfusion.     His underlying chronic kidney disease appears to be either from     ischemic nephropathy or hypertension versus the possibility of     sarcoid-induced interstitial nephritis or NSAID nephropathy.  We     will check a renal ultrasound in order to evaluate renal size  and     structure to rule out obstruction, check urinary electrolytes and     urinalysis in order to evaluate for proteinuria.  Serum protein     electrophoresis and free light chains will be assessed given the     possibility of secondary amyloid, C3 and C4 will be checked to     evaluate for atheroembolic phenomenon and ANA will be checked     empirically.  No acute electrolyte issues or volume concerns are     noted at this time and will continue following him closely as he     recovers from his acute renal insufficiency. 2. Status post aortic valve replacement.  Per Thoracic Surgery     recommendations/Dr. Laneta Simmers. 3. Anemia.  Stable, status post packed red cells infusion, we will     continue to monitor his postoperative trend for needs to intervene. 4. Screening for metabolic bone disease.  We will check an intact PTH,     phosphorus, 25-hydroxy vitamin D level, and  replace this if     depleted. 5. Sarcoidosis.  Clinically appears quiescent, we will continue to     monitor its respiratory effects and keep an eye on his calcium and     phosphorus levels.     Zetta Bills, MD     JP/MEDQ  D:  06/14/2011  T:  06/15/2011  Job:  [id number]  cc:   Aundra Dubin, M.D.  Electronically Signed by Zetta Bills MD on 06/26/2011 03:10:23 PM

## 2011-06-26 NOTE — Op Note (Signed)
[name], [name]               ACCOUNT NO.:  [id number]  MEDICAL RECORD NO.:  [id number]  LOCATION:  2302                         FACILITY:  [facility]  PHYSICIAN:  Evelene Croon, M.D.     DATE OF BIRTH:  21-Aug-1941  DATE OF PROCEDURE:  06/13/2011 DATE OF DISCHARGE:                              OPERATIVE REPORT   PREOPERATIVE DIAGNOSES: 1. Severe aortic stenosis and moderate aortic insufficiency. 2. Severe tricuspid regurgitation secondary to right ventricular     dysfunction.  POSTOPERATIVE DIAGNOSES: 1. Severe aortic stenosis and moderate aortic insufficiency. 2. Severe tricuspid regurgitation secondary to right ventricular     dysfunction.  OPERATIVE PROCEDURE:  Redo median sternotomy, extracorporeal circulation, aortic valve replacement using a 23-mm Edwards pericardial Magna-Ease valve, tricuspid valve repair using a 34-mm Edwards MC3 annuloplasty ring, insertion of right femoral arterial line for blood pressure monitoring.  SURGEON:  Evelene Croon, MD  ASSISTANT:  Coral Ceo, PA-C  ANESTHESIA:  General endotracheal.  CLINICAL HISTORY:  This patient is a 70 year old gentleman who I did coronary bypass surgery x5 in September 2006.  He had a left internal mammary graft to the LAD with saphenous vein graft to diagonal branch, to posterior descending artery, and a sequential vein graft to the first and second obtuse marginal arteries.  The ejection fraction at that time was about 50%.  He did have a slow postoperative recovery from that surgery.  He had no history of aortic stenosis at that time.  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  He was treated with prednisone for about 3 years which was discontinued in October 2011.  He was also diagnosed with obstructive sleep apnea and started on CPAP.  Over the past few months, he has had progressive  exertional dyspnea as well as development of abdominal ascites.  This has gotten to the point where he has significant shortness of breath with any activity.  His echocardiogram on April 20, 2011, showed critical aortic stenosis with heavily calcified aortic valve with peak gradient of 120 mmHg and mean gradient of 68 mmHg.  Valve area was 0.8 cm2.  He had moderate aortic insufficiency.  Left ventricular ejection fraction was 55-60% with mild left ventricular hypertrophy and essentially normal systolic function. There were no regional wall motion abnormalities.  He also had moderate pulmonary valve regurgitation and severe tricuspid regurgitation with pulmonary hypertension and pulmonary peak pressure estimated at 51 mmHg. There was a small pericardial effusion and large right pleural effusion. Left atrium was moderately dilated with mild mitral regurgitation. Right ventricular systolic function was mildly reduced.  After I saw him in the office, I felt that aortic valve replacement and tricuspid valve repair would be the best treatment.  He had cardiac catheterization performed by Dr. Riley Kill and it showed patent coronary grafts.  There was some thrombus noted in the left main coronary artery, but this vessel was severely diseased, stenosed, and really did not supply any significant territory.  His creatinine post catheterization was about 2.6.  After review of all these studies, I felt that the best treatment would be  to proceed with aortic valve replacement and tricuspid valve repair with an annuloplasty ring.  I told the patient I would plan to use a tissue valve given his age and medical problems.  I also discussed the possibility of needing to extend his right coronary vein graft so that I could perform an aortotomy.  We discussed pros and cons of surgery as well as the benefits and risks including, but not limited to bleeding, blood transfusion, infection, stroke, myocardial  infarction, graft failure, renal failure requiring dialysis, organ dysfunction, and death.  He understood all this and agreed to proceed.  OPERATIVE PROCEDURE:  The patient was taken to operating room and placed on table in supine position.  After induction of general endotracheal anesthesia, a Foley catheter was placed in bladder in sterile technique. The patient was allergic to LATEX, and latex allergy protocol was used. Preoperative intravenous vancomycin and Avelox were given since he is PENICILLIN allergic.  Then, the chest, abdomen and both lower extremities were prepped and draped in usual sterile manner. Transesophageal echocardiogram was performed by Dr. Kipp Brood, Anesthesiology, and this showed the same findings as previously discussed with good left ventricular systolic function with mild left ventricular hypertrophy, severe-to-critical calcified aortic stenosis with moderate regurgitation, trivial mitral regurgitation, trivial pulmonary valve regurgitation, and severe tricuspid regurgitation with moderate right ventricular dilatation and dysfunction.  The patient did have PA pressure of 75/35 prebypass and elevated CVP in the 20s. Then, the chest was entered through the previous median sternotomy incision.  Sternal wires were removed and the sternum opened using oscillating saw without difficulty.  The bone edges were retracted with bone hooks and the heart was dissected from the back of the sternum without difficulty.  The chest retractor was placed.  Then dissection of the adhesions was performed to expose the right atrium and ascending aorta.  There were moderately dense adhesions and this took considerable amount of time.  The prior vein grafts were identified and carefully preserved.  The patient was then heparinized, and when adequate ACT was obtained, the proximal aortic arch was cannulated using 20-French aortic cannula for arterial inflow.  Venous outflow was  achieved using bicaval venous cannulation with a 28-French metal-tipped right angle cannula, placed through a pursestring suture in the superior vena cava and a 36- [place] malleable plastic cannula inserted through a pursestring suture in the lower right atrium at the IVC junction.  An antegrade cardioplegia and vent cannula was inserted in the aortic root.  The patient was then placed on cardiopulmonary bypass.  The remainder of the adhesions over the right atrium were divided to expose the right superior pulmonary vein.  Left ventricular vent was placed to the right superior pulmonary vein.  A retrograde cardioplegic cannula was inserted through a pursestring suture in the right atrium and advanced in the coronary sinus without difficulty.  The superior and inferior vena cavae were encircled with tapes.  Then, the LAD was identified.  This was traced proximally and the left internal mammary pedicle was then encountered as it entered the pericardial cavity laterally.  It was carefully encircled.  Then, the patient was cooled to 28 degrees centigrade.  The aorta was crossclamped and 500 mL of cold blood antegrade cardioplegia was administered in the aortic root with slow arrest of the heart.  The left internal mammary pedicle was occluded with an atraumatic clamp.  This was followed by 1000 mL of cold blood retrograde cardioplegia.  Topical hypothermic iced saline was used.  Temperature  probe placed in the septum.  We were able to decrease myocardial temperature around 14 degrees centigrade. Additional dose of cold blood retrograde cardioplegia were given at about 20-minute intervals throughout the remainder of the case.  Attention was turned first to the aortic valve replacement.  There was enough room in the aortic root to allow an aortotomy without dividing the right coronary vein graft which was retracted way.  The pericardial cavity was insufflated with CO2 throughout the case to  minimize intracardiac air.  Then, the aorta was opened transversely just above the sinotubular junction.  Examination of the native aortic valve showed that there were three leaflets that were heavily calcified with fusion of the commissures and small central opening.  The left coronary ostium was identified in its usual position and did not have any obstruction. 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  Then course through the wall of the aorta anteriorly.  The native valve was excised.  The annulus decalcified with rongeurs.  Care was taken to remove all particulate debris.  Left ventricle and aortic root were copiously irrigated with iced saline solution.  Then, the annulus was sized and a 23-mm Edwards pericardial Magna-Ease  valve was chosen.  This had model number [id number], serial number [id number].  A series of pledgeted 2-0 Ethibond horizontal mattress sutures were placed around the aortic annulus, was pledgeted in the subannular position.  The sutures were then placed through the sewing ring and the valve lowered into place. The sutures were tied sequentially.  The valve seated nicely.  The coronary ostia were not obstructed.  Then, the aortotomy was closed in two layers using continuous 4-0 Prolene suture.  The aortotomy was lightly covered CoSeal for hemostasis.  Then, attention was turned to tricuspid valve repair.  The patient was rewarmed to 37 degrees centigrade.  The caval tapes were tightened.  The retrograde cardioplegic cannula was removed.  The right atrium was opened through a vertical incision anterior to the right coronary vein graft.  Retractors were placed and examination of the tricuspid valve showed that there were three leaflets that were thin and pliable.  The annulus appeared dilated and I felt that a tricuspid ring would be sufficient to correct the regurgitation.  There were no areas  of prolapse or flail leaflet.  Then, a series of 2-0 Ethibond sutures were placed around tricuspid annulus.  Care was taken not place any sutures in the area of the conduction bundle.  Then, the valve was sized and a 34-mm Edwards MC3 annuloplasty ring was chosen.  This had model number 4900 and serial number [id number].  Then, the sutures were placed through the annuloplasty ring and ring lowered in place.  The sutures were tied sequentially.  The ring seated nicely.  Then, the valve was tested with iced saline solution and was completely competent.  Then, the right atriotomy was closed in two layers using continuous 4-0 Prolene suture and a light coating of FloSeal applied for hemostasis.  Then, the patient was given a dose of lidocaine.  The clamp was removed from mammary pedicle.  Left side of heart was de-aired and the head placed in Trendelenburg position.  Crossclamp was removed at time 132 minutes.  There was spontaneous return of ventricular fibrillation.  The patient was defibrillated into a junctional rhythm.  Then, 2 temporary right ventricular and right atrial pacing wires were placed and brought out through the skin.  Then, the  patient was rewarmed to 37 degrees centigrade.  He was weaned from cardiopulmonary bypass on low-dose dopamine and milrinone.  Total bypass time was 218 minutes.  Cardiac function appeared excellent.  Cardiac output of 80 liters a minute.  TEE showed good left ventricular function.  The right ventricle appearedmildly dilated and mildly hyperkinetic.  There was no tricuspid regurgitation.  The aortic valve prosthesis was functioning normally without evidence of perivalvular leak or regurgitation through the valve.  There was trivial mitral regurgitation.  Then, protamine was given and venous and aortic cannula was without removed without difficulty.  Hemostasis was achieved.  The patient was given platelets since his platelet count was 79,000  preoperatively.  After hemostasis was achieved, four chest tubes were placed with bilateral pleural tubes, two in the posterior pericardium, one in the anterior mediastinum.  The patient did have over 2 liters of serous pleural fluid in the right pleural space that was drained after the sternum was opened.  Then, the sternum was closed with double #6 stainless steel wires.  The fascia was closed with continuous #1 Vicryl suture.  Subcutaneous tissue was closed with continuous 2-0 Vicryl and the skin with a 3-0 Vicryl subcuticular closure.  Lower extremity vein harvest site was closed in layers in similar manner.  Sponge, needle and instrument counts were correct performed by scrub nurse.  Dry sterile dressing was applied over the incisions around the chest tubes which were Pleur-Evac suctioned.  The patient remained hemodynamically stable and transferred to the SICU in guarded, but stable condition.    Evelene Croon, M.D.    BB/MEDQ  D:  06/13/2011  T:  06/14/2011  Job:  [id number]  cc:   Cassell Clement, M.D.  Electronically Signed by Evelene Croon M.D. on 06/26/2011 08:48:35 AM

## 2011-07-02 ENCOUNTER — Encounter: Payer: Self-pay | Admitting: Cardiology

## 2011-07-03 ENCOUNTER — Encounter: Payer: Self-pay | Admitting: Cardiology

## 2011-07-03 LAB — POCT INR: INR: 3.78

## 2011-07-04 NOTE — Telephone Encounter (Signed)
Opened in error

## 2011-07-05 ENCOUNTER — Ambulatory Visit: Payer: Self-pay | Admitting: *Deleted

## 2011-07-05 ENCOUNTER — Encounter: Payer: Self-pay | Admitting: Cardiology

## 2011-07-05 ENCOUNTER — Ambulatory Visit (INDEPENDENT_AMBULATORY_CARE_PROVIDER_SITE_OTHER): Payer: Medicare Other | Admitting: Cardiology

## 2011-07-05 VITALS — BP 110/66 | HR 81 | Wt 177.0 lb

## 2011-07-05 DIAGNOSIS — I4891 Unspecified atrial fibrillation: Secondary | ICD-10-CM

## 2011-07-05 DIAGNOSIS — N400 Enlarged prostate without lower urinary tract symptoms: Secondary | ICD-10-CM

## 2011-07-05 DIAGNOSIS — E119 Type 2 diabetes mellitus without complications: Secondary | ICD-10-CM

## 2011-07-05 DIAGNOSIS — I359 Nonrheumatic aortic valve disorder, unspecified: Secondary | ICD-10-CM

## 2011-07-05 DIAGNOSIS — I38 Endocarditis, valve unspecified: Secondary | ICD-10-CM

## 2011-07-05 DIAGNOSIS — G4733 Obstructive sleep apnea (adult) (pediatric): Secondary | ICD-10-CM

## 2011-07-05 DIAGNOSIS — I482 Chronic atrial fibrillation, unspecified: Secondary | ICD-10-CM | POA: Insufficient documentation

## 2011-07-05 LAB — POCT INR
INR: 2.52
INR: 3.8

## 2011-07-05 NOTE — Assessment & Plan Note (Signed)
The patient has a history of diabetes mellitus.  Dr. Sharl Ma is His diabetologist.  The patient states that at home his morning blood sugars are running between 100 and 140

## 2011-07-05 NOTE — Assessment & Plan Note (Signed)
The patient had a prior history of severe aortic stenosis.  On 06/13/11 he underwent aortic valve replacement with a bioprosthetic valve by Dr. Laneta Simmers.  He returns today for his first postoperative visit.  He is making good progress.  He is less dyspneic.  He has diuresed nicely on Lasix.  He is not having any chest pain or angina but has had some right-sided chest wall pain.
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
## 2011-07-05 NOTE — Assessment & Plan Note (Signed)
The patient has a history of BPH.  Following his recent aortic valve replacement surgery the patient developed acute urinary retention when the catheter was removed postoperatively.  He went home with a catheter.  He is being followed closely by his urologist Dr. Bjorn Pippin.  His urologist plans a voiding trial in about one month.  He is on Hytrin.

## 2011-07-05 NOTE — Assessment & Plan Note (Signed)
Patient has a history of obstructive sleep apnea.  He has lost considerable weight over the past several months and he feels that his sleep apnea has improved.  He anticipates that he will need a repeat sleep study in several more months to see which device would best suit his requirements at that time.  We would deferred those questions to his pulmonologist Dr. Maple Hudson

## 2011-07-05 NOTE — Assessment & Plan Note (Signed)
Postoperatively the patient went into atrial fibrillation and has remained in atrial fibrillation following discharge.  He has been on warfarin and has been therapeutic.  He has not had any thromboembolic symptoms.  We will plan to do an elective cardioversion in about 3 more weeks if he remains adequately anticoagulated over that period of time.

## 2011-07-09 ENCOUNTER — Encounter: Payer: Self-pay | Admitting: Cardiology

## 2011-07-11 ENCOUNTER — Telehealth: Payer: Self-pay

## 2011-07-11 ENCOUNTER — Ambulatory Visit: Payer: Self-pay | Admitting: *Deleted

## 2011-07-11 NOTE — Telephone Encounter (Signed)
RX called to [facility] @ [facility] [phone] #[phone]/1. Pt notifed. srwlpn

## 2011-07-12 NOTE — Consult Note (Signed)
NAMELENARDO, WESTWOOD               ACCOUNT NO.:  [id number]  MEDICAL RECORD NO.:  [id number]  LOCATION:  XRAY                         FACILITY:  [facility]  PHYSICIAN:  Doylene Canning. 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  HISTORY OF PRESENT ILLNESS:  The patient is a 70 year old man who has history of coronary disease, status post bypass surgery and history of aortic valve stenosis and pulmonary hypertension with tricuspid valve insufficiency, who underwent redo aortic valve replacement and tricuspid valve repair several days ago.  Postoperatively, the patient's course was complicated by renal insufficiency and atrial fibrillation.  He has had intermittent bradycardia as well as tachycardia with heart rates in the 30s and up to the 140 range.  His dose of beta-blockers has been adjusted.  Despite this, he has been minimally if at all symptomatic. He has no history of syncope.  He denies chest pain.  His heart failure symptoms prior to surgery was class II to III.  He has no other specific complaints, but very much is insistent on being discharged today.  PAST MEDICAL HISTORY:  Notable for diabetes, deconditioning, hypertension, and anemia.  He has pulmonary hypertension, thought secondary to his heart failure.  SOCIAL HISTORY:  The patient is a retired Development worker, community.  He denies tobacco or ethanol abuse.  REVIEW OF SYSTEMS:  All systems reviewed negative except as noted in the HPI.  He does have peripheral edema.  He also has problems with urinary retention.  PHYSICAL EXAMINATION:  GENERAL:  Notable for him being a pleasant 70- year-old man in no acute distress. VITAL SIGNS:  Blood pressure was 128/70, pulse was 60 and irregular, respirations  were 18, temperature 98. HEENT:  Normocephalic and atraumatic.  Pupils equal, round.  Oropharynx is moist.  Sclerae anicteric. NECK:  Revealed 7-cm jugular venous distention.  There is no thyromegaly.  Trachea is midline. LUNGS:  Rales in the bases bilaterally.  No wheezes or rhonchi are present. CARDIOVASCULAR:  Irregularly irregular rhythm.  Normal S1 and split S2. PMI was not enlarged or laterally displaced. ABDOMEN:  Soft, nontender, nondistended. EXTREMITIES:  Demonstrated 2+ peripheral edema bilaterally. SKIN:  Otherwise normal. NEUROLOGIC:  Alert and oriented x3 with cranial nerves intact.  Strength is 5/5 and symmetric.  EKG demonstrates atrial fibrillation with right bundle-branch block and a variable ventricular response.  IMPRESSION: 1. Postoperative atrial fibrillation. 2. Tachy-brady syndrome. 3. Status post aortic valve replacement, tricuspid valve repair. 4. Volume overload.  DISCUSSION:  The patient has not had symptomatic tachybrady syndrome. He may well ultimately end up needing a permanent pacemaker, but he does not need one today.  The patient is insisting on discharge and I have no strong feelings against this.  I will be happy to see the patient as needed in outpatient [place] for followup.     Doylene Canning. Ladona Ridgel, MD     GWT/MEDQ  D:  06/23/2011  T:  06/24/2011  Job:  [id number]  cc:   Cassell Clement, M.D. Robert L. Foy Gu[place], M.D.  Electronically Signed by Lewayne Bunting MD on 07/12/2011 08:32:33 AM

## 2011-07-12 NOTE — Cardiovascular Report (Signed)
[name], [name]               ACCOUNT NO.:  [id number]  MEDICAL RECORD NO.:  [id number]  LOCATION:  3741                         FACILITY:  [facility]  PHYSICIAN:  Arturo Morton. Riley Kill, MD, FACCDATE OF BIRTH:  10/13/1941  DATE OF PROCEDURE:  05/29/2011 DATE OF DISCHARGE:                           CARDIAC CATHETERIZATION   INDICATIONS:  [name] is a 70 year old, well known to many of [place].  He previously underwent revascularization surgery in 2006.  He has been followed by Dr. Patty Sermons.  He has known progressive aortic valve stenosis and need surgery.  He also has tricuspid regurgitation, which will require likely a ring.  Importantly, the patient developed some chest pain and was previously scheduled to come in for his surgery and cath in a few weeks.  He presented now with chest pain and is now here for cardiac catheterization.  He does have renal insufficiency as well. His creatinine is in excess of 2.  This was all known prior to scheduling him for cardiac catheterization.  I met with Dr. Jimmey [name].  He is fully aware of the situation.  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  I reviewed his operative report.  He has been hydrated overnight.  PROCEDURE: 1. Selective coronary arteriography. 2. Saphenous vein graft angiography. 3. Selective left internal mammary angiography.  DESCRIPTION OF PROCEDURE:  The procedure was performed from the right femoral artery.  We did not do right heart catheterization as the patient was of the opinion that this was not necessary, and I spoke with Dr. Patty Sermons who felt that we could proceed on with just a defining his coronary anatomy.  This was performed from the right femoral artery, a 5- French sheath was placed.  We did views of the saphenous vein graft to the diagonal, sequential to the marginal system, and graft to the distal right.  We then used a Glidewire to gain  access to the subclavian and an internal mammary shot was obtained.  The right is known to be an anterior takeoff with a total occlusion and given the prior angiographic studies which showed total occlusion in retrograde collaterals, and now a patent graft.  We elected not to expend contrast for this given his creatinine in excess of 2.  There were no major complications.  He was taken to the holding area in satisfactory clinical condition.  HEMODYNAMIC DATA:  The central aortic pressure was 144/50 with a mean of 80.  ANGIOGRAPHIC DATA: 1. The total contrast load was 36 mL. 2. The left main has a clot just prior to takeoff of the LAD and     circumflex.  The LAD is occluded with a small amount of distal flow     and is occluded again.  Proximal circumflex has about 70% leading     into the marginal which gets some retrograde flow through the     bypass graft and competitive into the AV circumflex, which goes     distally.  The AV circumflex is relatively small. 3. The right coronary artery is not injected as per my previous note. 4. The saphenous vein graft to the  diagonal was widely patent. 5. The sequential saphenous vein graft to the 2 marginal branches are     patent.  There is very little vessel beyond the first marginal     insertion, though it does fill retrograde.  This also fills into     the mid circumflex.  The graft continues posteriorly where it     inserts prior to the bifurcation in the marginal system.  The     marginal system proximal to this is severely diseased. 6. The saphenous vein graft to the PDA is patent and fills the PDA/PLA     system retrograde. 7. The internal mammary to the distal LAD is widely patent.  The LAD     proper, beyond the origin of the mammary graft, is diffusely     diseased.  There is somewhat small in caliber, with a lot of areas     of fairly high-grade irregularity.  CONCLUSION: 1. Apparent clot to the left main artery, but supplying  only a small     vascular territory with some distal flow already noted through the     marginal graft. 2. Known occlusion of the right coronary artery from the previous     study. 3. Patent saphenous vein graft to the diagonal. 4. Patent sequential saphenous vein graft to the OM1 and distal     circumflex. 5. Patent saphenous vein graft to the posterior descending artery. 6. Patent internal mammary to the LAD with diffuse distal vessel.  DISPOSITION:  We will notify Dr. Laneta Simmers with the patient's findings. Surgery is planned.  We will keep him overnight and continue to hydrate him with the hope of minimizing his contrast risk.  30 mL of contrast were utilized as noted.  We will discontinue his heparin.     Arturo Morton. Riley Kill, MD, [facility]     TDS/MEDQ  D:  05/29/2011  T:  05/30/2011  Job:  [id number]  cc:   Evelene Croon, M.D. Cassell Clement, M.D. Colleen Can. Deborah Chalk, M.D. Peter M. [place], M.D.  Electronically Signed by Shawnie Pons MD [facility] on 07/12/2011 07:20:20 AM

## 2011-07-16 ENCOUNTER — Encounter: Payer: Self-pay | Admitting: Cardiology

## 2011-07-16 ENCOUNTER — Other Ambulatory Visit: Payer: Self-pay | Admitting: Surgery

## 2011-07-16 DIAGNOSIS — I359 Nonrheumatic aortic valve disorder, unspecified: Secondary | ICD-10-CM

## 2011-07-17 ENCOUNTER — Ambulatory Visit
Admission: RE | Admit: 2011-07-17 | Discharge: 2011-07-17 | Disposition: A | Discharge status: Home / Self Care | Payer: Medicare Other | Source: Ambulatory Visit | Attending: Surgery | Admitting: Surgery

## 2011-07-17 ENCOUNTER — Encounter: Payer: Self-pay | Admitting: Surgery

## 2011-07-17 ENCOUNTER — Encounter (INDEPENDENT_AMBULATORY_CARE_PROVIDER_SITE_OTHER): Payer: Medicare Other | Admitting: Surgery

## 2011-07-17 DIAGNOSIS — I359 Nonrheumatic aortic valve disorder, unspecified: Secondary | ICD-10-CM

## 2011-07-17 DIAGNOSIS — I079 Rheumatic tricuspid valve disease, unspecified: Secondary | ICD-10-CM

## 2011-07-17 LAB — POCT INR: INR: 2.4

## 2011-07-18 ENCOUNTER — Ambulatory Visit (INDEPENDENT_AMBULATORY_CARE_PROVIDER_SITE_OTHER): Payer: Self-pay | Admitting: *Deleted

## 2011-07-18 DIAGNOSIS — I4891 Unspecified atrial fibrillation: Secondary | ICD-10-CM

## 2011-07-18 NOTE — Assessment & Plan Note (Signed)
OFFICE VISIT  ZALE, MARCOTTE DOB:  [birth year]                                        July 17, 2011 CHART #:  [id number]  HISTORY OF PRESENT ILLNESS:  The patient is status post redo sternotomy for aortic valve replacement using a 23-mm Edwards pericardial Magna Ease valve, tricuspid valve repair using a 34-mm Edwards MC3 annuloplasty ring with insertion of right femoral arterial line for blood pressure monitoring.  This was done by Dr. Lavinia Sharps on June 13, 2011.  During the patient's postoperative course, he developed postoperative atrial fibrillation and stayed in atrial fibrillation but was rate controlled with medication.  He was started ultimately on Coumadin.  The patient also had urinary retention postoperatively requiring Foley insertion.  The patient ultimately was unable to void following removal of Foley and was discharged to home with Foley.  The patient presents back today for his 3-week followup visit.  The patient feels he is progressing well.  He is up ambulating several times per day, progressing well.  He still has some sternal incisional pain for which he takes Ultram for as needed but not too frequently.  He denies any fever, nausea, vomiting, opening drainage from any of his incision sites, shortness of breath, or coughing.  He has seen Dr. Patty Sermons and plans to follow up back up with him in August 1.  The patient states Dr. Patty Sermons wants to try cardioversion on him once okay with Dr. Edwin Cap. He also says an appointment to see Dr. Annabell Howells on August 01, 2011, for further management of his Foley.  He states that they did attempt to take it out once again since he left the hospital but had a reinsert. Cardiac rehab has contacted the patient but he is going to wait until after his cardioversion and Foley management to proceed.  The patient is sleeping well at night.  He states that he did go out and purchase some TED hose and this did help  with his swelling of his lower extremities.  PHYSICAL EXAMINATION:  Vital Signs:  Blood pressure 144/68, pulse is 60, respirations 16, O2 sats 98% on room air.  Respiratory:  Clear to auscultation bilaterally.  Cardiac irregularly regular.  No murmurs noted.  Abdomen:  Benign.  Extremities:  No edema noted.  Warm and well- perfused.  Incisions:  All incisions are clean, dry, and intact and healing well.  LABORATORY DATA:  The patient had a PA and lateral chest x-ray obtained today which shows a tiny right pleural effusion noted.  Otherwise stable.  Sternal wires are noted to be intact.  IMPRESSION AND PLAN:  The patient is progressing well following surgery. At this point, it is okay for Dr. Patty Sermons to proceed with cardioversion from surgical standpoint.  The patient is instructed to continue walking 3-4 times per day progressing as tolerated.  He is encouraged to proceed with cardiac rehab.  He is instructed to do no heavy lifting over 10 pounds for 2 months.  At this point, it is okay for the patient to start driving short distances.  The patient will continue to follow up with Dr. Annabell Howells as directed.  We will plan to bring the patient back in 4 weeks for follow up with Dr. Lavinia Sharps for further evaluation and progression.  The patient is instructed, if he develops any surgical issues he is  to contact us and we will see him sooner.  The patient is in agreement.  Sol Blazing, PA  KMD/MEDQ  D:  07/17/2011  T:  07/18/2011  Job:  [id number]  cc:   Tonita Cong, M.D. Clinton D. Maple Hudson, MD, FCCP, FACP Excell Seltzer. Annabell Howells, M.D. Zetta Bills, MD Dr. Patty Sermons

## 2011-07-19 ENCOUNTER — Other Ambulatory Visit: Payer: Self-pay | Admitting: *Deleted

## 2011-07-19 DIAGNOSIS — Z952 Presence of prosthetic heart valve: Secondary | ICD-10-CM

## 2011-07-23 NOTE — Op Note (Signed)
[name], [name]               ACCOUNT NO.:  [id number]  MEDICAL RECORD NO.:  [id number]  LOCATION:  2302                         FACILITY:  [facility]  PHYSICIAN:  Guadalupe Maple, M.D.  DATE OF BIRTH:  11/26/1941  DATE OF PROCEDURE:  06/13/2011 DATE OF DISCHARGE:                              OPERATIVE REPORT   PROCEDURE:  Intraoperative transesophageal echocardiography.  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  He also was recently diagnosed with critical aortic stenosis and moderate aortic insufficiency and severe tricuspid regurgitation.  He was also noted to have pulmonary hypertension, chronic renal insufficiency, and sarcoidosis.  He is now scheduled to undergo aortic valve replacement and tricuspid valve repair and coronary artery bypass grafting revision by Dr. Laneta Simmers.  Intraoperative transesophageal echocardiography was requested to evaluate the aortic valve to assist with aortic valve replacement to assess the tricuspid valve and determine if any other valvular pathology was present and to serve as a monitor for intraoperative volume status.  The patient was brought to the operating room at [facility] and general anesthesia was induced without difficulty.  Following endotracheal intubation and orogastric suctioning, the transesophageal echocardiography probe was then inserted into the esophagus without difficulty.  IMPRESSION:  Prebypass findings: 1. Aortic valve:  The aortic valve was heavily calcified and with     severe restriction to opening.  The aortic annulus measured 2.6 cm.     The aortic peak velocity was 4.71 meters per second and the mean     gradient was 40 mmHg.  There was 2 to 3+ aortic insufficiency noted     as well. 2. Mitral valve:  There was moderate to severe mitral annular     calcification.  There was some restriction to opening and     fluttering of  the anterior leaflet due to the aortic valve jet.     However, the mitral valve appeared to open sufficiently.  There was     trace to 1+ mitral insufficiency noted.  There was no prolapse of     the valve or torn chordae appreciated. 3. Left ventricle:  There was moderate left ventricular hypertrophy.     Left ventricular wall thickness measured 1.25 cm at end diastole at     the midpapillary level of the posterior wall.  There appeared to be     some flattening of the interventricular septum and ejection     fraction was estimated at 50% with some decreased contractility     globally and no focal wall motion abnormalities that could be     appreciated.  There was no thrombus noted in the left ventricular     apex. 4. Right ventricle:  The right ventricular cavity was dilated and     appeared to occupy the same area as the left ventricle and appeared     to form the apex of the heart as well.  There was decreased     contractility of the right ventricular free wall, however, was     moving in.  This was consistent with moderate right ventricular  dysfunction. 5. Tricuspid valve:  The tricuspid annulus was dilated and measured     5.1 cm in the four-chamber view and 5.2 cm in diameter in the RV     inflow-outflow view.  There was 3+ tricuspid insufficiency that     could be appreciated with a central jet.  There was apical     displacement of the coaptation point. 6. Interatrial septum:  The interatrial septum was intact without     evidence of patent foramen ovale or atrial septal defect by color     Doppler.  However, the interatrial septum did bow right to left     consistent with elevated right-sided pressures. 7. Left atrium:  The left atrial cavity appeared mildly enlarged.     There was no thrombus noted in the left atrium or left atrial     appendage. 8. Descending aorta.  The ascending aorta could not be well     appreciated, however, it appeared to be thickening of the  walls of     the ascending aorta with no mobile atheromatous plaques noted and     there was no aneurysmal dilatation of the ascending aorta.  There     was mild to moderate atheromatous disease noted in the descending     aorta and the descending aorta measured 2.6 cm in diameter.  Postbypass findings: 1. Aortic valve:  There was a bioprosthetic valve in the aortic     position and the leaflets opened normally and there was no aortic     insufficiency appreciated. 2. Mitral valve:  The mitral leaflets coapted well with no fluttering     of the anterior leaflet noted.  There was trace mitral     insufficiency.  There was moderate to severe mitral annular     calcification noted. 3. Left ventricle:  There was some dyssynchrony of the left     ventricular contractile pattern due to ventricular pacing, however,     the left ventricular function appeared somewhat improved from the     prebypass study and ejection fraction was estimated at 55%. 4. Right ventricle:  The right ventricular cavity was dilated.  There     was decreased contractility of the right ventricular free wall     consistent with moderate right ventricular dysfunction.  However,     there was no frank flattening of the interventricular septum, so     the right ventricular dysfunction was not considered to be severely     impaired. 5. Tricuspid valve:  There was an annuloplasty ring in the tricuspid     position.  There was more basilar displacement of the coaptation of     the leaflets and there was trace tricuspid insufficiency which was     markedly improved from the prebypass study.          ______________________________ Guadalupe Maple, M.D.     DCJ/MEDQ  D:  06/14/2011  T:  06/15/2011  Job:  [id number]  Electronically Signed by Kipp Brood M.D. on 07/23/2011 11:19:08 AM

## 2011-07-24 ENCOUNTER — Encounter: Payer: Self-pay | Admitting: Cardiology

## 2011-07-25 ENCOUNTER — Telehealth: Payer: Self-pay | Admitting: Cardiology

## 2011-07-25 ENCOUNTER — Ambulatory Visit (INDEPENDENT_AMBULATORY_CARE_PROVIDER_SITE_OTHER): Payer: Medicare Other | Admitting: Cardiology

## 2011-07-25 ENCOUNTER — Encounter: Payer: Self-pay | Admitting: Cardiology

## 2011-07-25 ENCOUNTER — Ambulatory Visit (INDEPENDENT_AMBULATORY_CARE_PROVIDER_SITE_OTHER): Payer: Medicare Other | Admitting: *Deleted

## 2011-07-25 DIAGNOSIS — Z01818 Encounter for other preprocedural examination: Secondary | ICD-10-CM

## 2011-07-25 DIAGNOSIS — Z952 Presence of prosthetic heart valve: Secondary | ICD-10-CM

## 2011-07-25 DIAGNOSIS — I4891 Unspecified atrial fibrillation: Secondary | ICD-10-CM

## 2011-07-25 DIAGNOSIS — I359 Nonrheumatic aortic valve disorder, unspecified: Secondary | ICD-10-CM

## 2011-07-25 DIAGNOSIS — Z954 Presence of other heart-valve replacement: Secondary | ICD-10-CM

## 2011-07-25 DIAGNOSIS — N400 Enlarged prostate without lower urinary tract symptoms: Secondary | ICD-10-CM

## 2011-07-25 DIAGNOSIS — D689 Coagulation defect, unspecified: Secondary | ICD-10-CM

## 2011-07-25 LAB — POCT INR: INR: 2.6

## 2011-07-25 NOTE — Progress Notes (Signed)
[name] Date of Birth:  26-Jul-1941 [facility] [address]   [address] [place], [place]  [address] [phone]           Fax   [phone]  History of Present Illness: The patient has a past history of ischemic heart disease with previous coronary artery bypass graft surgery on 09/07/05 by Dr. Laneta Simmers.  He underwent aortic valve replacement with a tissue valve in June 2012 by Dr. Laneta Simmers.  He went home in atrial fibrillation on Coumadin which was a new rhythm for him.  He also went home with a Foley catheter and leg bag because of postoperative urinary retention.  Postoperatively he had a lot of anasarca and fluid retention which has gradually resolved.  He has not been expressing any chest pain or angina.  He is getting stronger by the week.  Yesterday he drove his car for the first time but today he is more sore in his chest from moving the steering wheel of the car.  Current Outpatient Prescriptions  Medication Sig Dispense Refill  . allopurinol (ZYLOPRIM) 300 MG tablet Take 100 mg by mouth daily.       [name] aspirin 81 MG tablet Take 81 mg by mouth daily.        . diazepam (VALIUM) 5 MG tablet Take 5 mg by mouth every 6 (six) hours as needed. Taking as needed       . dutasteride (AVODART) 0.5 MG capsule Take 0.5 mg by mouth daily.        [name] Fe Bisgly-Succ-C-Thre-B12-FA (IRON-150 PO) Take by mouth daily.        . furosemide (LASIX) 20 MG tablet Take 40 mg by mouth 2 (two) times daily.       . metoprolol (LOPRESSOR) 50 MG tablet 25 mg 2 (two) times daily. 1 twice a day      . spironolactone (ALDACTONE) 25 MG tablet Take 1 tablet (25 mg total) by mouth daily.  100 tablet  11  . terazosin (HYTRIN) 10 MG capsule Once a day       . traMADol (ULTRAM) 50 MG tablet Take 50 mg by mouth every 6 (six) hours as needed.        . warfarin (COUMADIN) 2.5 MG tablet Take 2.5 mg by mouth daily. Taking daily in the pm       . zolpidem (AMBIEN) 10 MG tablet Take 1 tablet (10 mg  total) by mouth at bedtime as needed.  30 tablet  5    Allergies  Allergen Reactions  . Actos (Pioglitazone Hydrochloride)   . Altace   . Amiodarone   . Indocin   . Latex   . Lotensin     ?  . Norvasc (Amlodipine Besylate)   . Orudis (Ketoprofen)   . Penicillins   . Pioglitazone     REACTION: causes CHF and edema  . Talwin   . Zoloft     Patient Active Problem List  Diagnoses  . Sarcoidosis  . OBSTRUCTIVE SLEEP APNEA  . MYOCARDIAL INFARCTION  . CORONARY HEART DISEASE  . AORTIC STENOSIS  . VALVULAR HEART DISEASE  . DYSPNEA  . Hx of CABG  . Diabetes mellitus  . Gout  . BPH (benign prostatic hyperplasia)  . Benign hypertensive heart disease without heart failure  . Chronic kidney disease (CKD), stage III (moderate)  . Atrial fibrillation with controlled ventricular response    History  Smoking status  . Former Smoker  .  Types: Cigarettes, Pipe  . Quit date: 12/25/1995  Smokeless tobacco  . Not on file    History  Alcohol Use: Not on file    Family History  Problem Relation Age of Onset  . Heart attack Father   . Allergies    . Asthma    . Heart disease    . Cancer      Review of Systems: Constitutional: no fever chills diaphoresis or fatigue or change in weight.  Head and neck: no hearing loss, no epistaxis, no photophobia or visual disturbance. Respiratory: No cough, shortness of breath or wheezing. Cardiovascular: No chest pain peripheral edema, palpitations. Gastrointestinal: No abdominal distention, no abdominal pain, no change in bowel habits hematochezia or melena. Genitourinary: No dysuria, no frequency, no urgency, no nocturia. Musculoskeletal:No arthralgias, no back pain, no gait disturbance or myalgias. Neurological: No dizziness, no headaches, no numbness, no seizures, no syncope, no weakness, no tremors. Hematologic: No lymphadenopathy, no easy bruising. Psychiatric: No confusion, no hallucinations, no sleep disturbance.    Physical  Exam: Filed Vitals:   07/25/11 1114  BP: 110/60  Pulse: 80  The general appearance reveals a well-developed well-nourished gentleman in no distress.Pupils equal and reactive.   Extraocular Movements are full.  There is no scleral icterus.  The mouth and pharynx are normal.  The neck is supple.  The carotids reveal no bruits.  The jugular venous pressure is normal.  The thyroid is not enlarged.  There is no lymphadenopathy.  The chest is clear to percussion and auscultation. There are no rales or rhonchi. Expansion of the chest is symmetrical.    The heart reveals no significant systolic murmur anymore and no diastolic murmur and no rub.The abdomen is soft and nontender. Bowel sounds are normal. The liver and spleen are not enlarged. There Are no abdominal masses. There are no bruits.    Extremities reveal trace edema.Strength is normal and symmetrical in all extremities.  There is no lateralizing weakness.  There are no sensory deficits.  The skin is warm and dry.  There is no rash.  There are no overt psychiatric signs or symptoms.     Assessment / Plan: We will plan to proceed with elective direct-current cardioversion at [facility] short stay on Thursday, August 9 at 2 PM.  We went over the procedure with him today.  He will come in 2 days ahead of time on August 7 to get preoperative lab work.  He does not need another chest x-ray.  He will be n.p.o. The morning of the procedure we will make his followup appointment after the cardioversion.  If cardioversion is successful we hope to get him off his Coumadin 3-4 weeks postop and then allow for him to proceed with his urologic surgery for his BPH.

## 2011-07-25 NOTE — Telephone Encounter (Signed)
Called on behalf of Dr Olevia Perches to cancel lab visit next week with [facility]. Pt is having labs done a [facility] office 8/7 pre-op for cardioversion 8/9 Spoke to Ramsay and she will provide a message to [facility] the nurse.

## 2011-07-25 NOTE — Assessment & Plan Note (Signed)
The patient had postoperatively urinary retention and had to go home from the hospital with a Foley catheter and a leg bag which he still has in place.  He hopes to get the catheter removed in the next week or so he anticipates that in another month or so he may need to have urology surgery for his BPH by Dr.Wrenn.We will have to have him off of his Coumadin prior to that time.

## 2011-07-25 NOTE — Assessment & Plan Note (Signed)
The patient remains in atrial fibrillation with a controlled ventricular response.  He has been adequately anticoagulated with Coumadin.  We discussed elective cardioversion today in detail.  We will plan to cardiovert him on Thursday, August 9 at [facility] cone short stay at 2 PM.  He will arrive at the hospital at 12 noon.  He will be n.p.o. That morning.  He will come into the office for pre-cardioversion lab work on Tuesday, August 7.  He has had recent chest x-rays so he does not need another chest x-ray.  The patient is anxious to be cardioverted so that he will be able to get off his Coumadin.  He is going to have to have some definitive urologic surgery to relieve his BPH problem.  At the present time he still has a Foley catheter in place with a leg bag.

## 2011-07-25 NOTE — Patient Instructions (Signed)
We will do         Dc cardioversion on 07/31/11

## 2011-07-25 NOTE — Assessment & Plan Note (Signed)
The patient has a bioprosthetic aortic valve prosthesis.  He underwent this on 06/13/11.  Postoperatively he had a lot of congestive heart failure which has improved significantly over the course of the past 6 weeks.  He has remained in atrial fibrillation postoperatively.  He has been on warfarin for his atrial fibrillation.  He has been therapeutic for the past 3 or more weeks.

## 2011-07-31 ENCOUNTER — Other Ambulatory Visit (INDEPENDENT_AMBULATORY_CARE_PROVIDER_SITE_OTHER): Payer: Medicare Other | Admitting: *Deleted

## 2011-07-31 DIAGNOSIS — I4891 Unspecified atrial fibrillation: Secondary | ICD-10-CM

## 2011-07-31 DIAGNOSIS — Z01818 Encounter for other preprocedural examination: Secondary | ICD-10-CM

## 2011-07-31 LAB — APTT: aPTT: 38.3 s — ABNORMAL HIGH (ref 21.7–28.8)

## 2011-07-31 LAB — BASIC METABOLIC PANEL
BUN: 45 mg/dL — ABNORMAL HIGH (ref 6–23)
Chloride: 99 mEq/L (ref 96–112)
GFR: 52.13 mL/min — ABNORMAL LOW (ref >60.00)
Glucose, Bld: 141 mg/dL — ABNORMAL HIGH (ref 70–99)
Potassium: 4.8 mEq/L (ref 3.5–5.1)
Sodium: 137 mEq/L (ref 135–145)

## 2011-07-31 LAB — CBC WITH DIFFERENTIAL/PLATELET
Basophils Relative: 0.5 % (ref 0.0–3.0)
Eosinophils Relative: 4.9 % (ref 0.0–5.0)
Lymphocytes Relative: 13.5 % (ref 12.0–46.0)
Neutrophils Relative %: 66.1 % (ref 43.0–77.0)
Platelets: 149 10*3/uL — ABNORMAL LOW (ref 150.0–400.0)
RBC: 3.94 Mil/uL — ABNORMAL LOW (ref 4.22–5.81)
WBC: 4.5 10*3/uL (ref 4.5–10.5)

## 2011-07-31 LAB — PROTIME-INR: Prothrombin Time: 24.3 s — ABNORMAL HIGH (ref 10.2–12.4)

## 2011-07-31 LAB — URIC ACID: Uric Acid, Serum: 6.3 mg/dL (ref 4.0–7.8)

## 2011-07-31 LAB — MAGNESIUM: Magnesium: 1.9 mg/dL (ref 1.5–2.5)

## 2011-08-01 ENCOUNTER — Telehealth: Payer: Self-pay | Admitting: *Deleted

## 2011-08-01 NOTE — Telephone Encounter (Signed)
Advised of lab results, ok for cardioversion

## 2011-08-02 ENCOUNTER — Ambulatory Visit (HOSPITAL_COMMUNITY)
Admission: RE | Admit: 2011-08-02 | Discharge: 2011-08-02 | Disposition: A | Discharge status: Home / Self Care | Payer: Medicare Other | Source: Ambulatory Visit | Attending: Cardiology | Admitting: Cardiology

## 2011-08-02 DIAGNOSIS — I4891 Unspecified atrial fibrillation: Secondary | ICD-10-CM | POA: Insufficient documentation

## 2011-08-02 DIAGNOSIS — Z01810 Encounter for preprocedural cardiovascular examination: Secondary | ICD-10-CM | POA: Insufficient documentation

## 2011-08-02 DIAGNOSIS — I252 Old myocardial infarction: Secondary | ICD-10-CM | POA: Insufficient documentation

## 2011-08-02 DIAGNOSIS — Z87891 Personal history of nicotine dependence: Secondary | ICD-10-CM | POA: Insufficient documentation

## 2011-08-02 DIAGNOSIS — Z7901 Long term (current) use of anticoagulants: Secondary | ICD-10-CM | POA: Insufficient documentation

## 2011-08-02 DIAGNOSIS — M109 Gout, unspecified: Secondary | ICD-10-CM | POA: Insufficient documentation

## 2011-08-02 DIAGNOSIS — N400 Enlarged prostate without lower urinary tract symptoms: Secondary | ICD-10-CM | POA: Insufficient documentation

## 2011-08-02 DIAGNOSIS — G4733 Obstructive sleep apnea (adult) (pediatric): Secondary | ICD-10-CM | POA: Insufficient documentation

## 2011-08-02 DIAGNOSIS — I2510 Atherosclerotic heart disease of native coronary artery without angina pectoris: Secondary | ICD-10-CM | POA: Insufficient documentation

## 2011-08-02 DIAGNOSIS — E119 Type 2 diabetes mellitus without complications: Secondary | ICD-10-CM | POA: Insufficient documentation

## 2011-08-02 DIAGNOSIS — N183 Chronic kidney disease, stage 3 unspecified: Secondary | ICD-10-CM | POA: Insufficient documentation

## 2011-08-02 NOTE — Progress Notes (Signed)
Advised of labs 

## 2011-08-03 ENCOUNTER — Encounter: Payer: Self-pay | Admitting: Cardiology

## 2011-08-06 ENCOUNTER — Other Ambulatory Visit: Payer: Self-pay | Admitting: *Deleted

## 2011-08-06 DIAGNOSIS — I119 Hypertensive heart disease without heart failure: Secondary | ICD-10-CM

## 2011-08-06 MED ORDER — FUROSEMIDE 40 MG PO TABS: 40.0000 mg | ORAL_TABLET | Freq: Two times a day (BID) | ORAL | Status: DC

## 2011-08-06 MED ORDER — METOPROLOL TARTRATE 25 MG PO TABS: 25.0000 mg | ORAL_TABLET | Freq: Two times a day (BID) | ORAL | Status: DC

## 2011-08-06 MED ORDER — ALLOPURINOL 100 MG PO TABS: 100.0000 mg | ORAL_TABLET | Freq: Every day | ORAL | Status: DC

## 2011-08-06 NOTE — Telephone Encounter (Signed)
Refilled meds per fax request.  

## 2011-08-06 NOTE — Op Note (Signed)
  NAMEROWE, WARMAN NO.:  [id number]  MEDICAL RECORD NO.:  [id number]  LOCATION:  [facility]                         FACILITY:  [facility]  PHYSICIAN:  Cassell Clement, M.D. DATE OF BIRTH:  [birth year]  DATE OF PROCEDURE:  08/02/2011 DATE OF DISCHARGE:                              OPERATIVE REPORT   PROCEDURE:  Direct current cardioversion.  This 70 year old gentleman is admitted to short-stay for elective cardioversion.  He has been in atrial fibrillation since aortic valve replacement several months ago.  He has been adequately anticoagulated with Coumadin.  After IV propofol given by anesthesiologist, Dr. Ivin Booty, the patient was given 120 joules shock using the AP pads and he failed to convert. While still under adequate anesthesia, he was given a second shock of 200 joules synchronized and converted to marked sinus bradycardia. Initially, the P-waves were extremely small.  The patient tolerated the procedure well with no immediate postanesthetic complications.  IMPRESSION:  Successful direct current cardioversion of atrial fibrillation into sinus bradycardia with premature atrial contractions.          ______________________________ Cassell Clement, M.D.     TB/MEDQ  D:  08/02/2011  T:  08/03/2011  Job:  [id number]  cc:   Evelene Croon, M.D. Tonita Cong, M.D. Excell Seltzer. Annabell Howells, M.D.  Electronically Signed by Cassell Clement M.D. on 08/06/2011 02:31:26 PM

## 2011-08-08 ENCOUNTER — Encounter: Payer: Self-pay | Admitting: Surgery

## 2011-08-09 ENCOUNTER — Ambulatory Visit: Payer: Medicare Other | Admitting: *Deleted

## 2011-08-09 ENCOUNTER — Encounter: Payer: Self-pay | Admitting: Cardiology

## 2011-08-09 ENCOUNTER — Ambulatory Visit (INDEPENDENT_AMBULATORY_CARE_PROVIDER_SITE_OTHER): Payer: Medicare Other | Admitting: Cardiology

## 2011-08-09 VITALS — BP 136/80 | HR 64 | Wt 191.0 lb

## 2011-08-09 DIAGNOSIS — I4891 Unspecified atrial fibrillation: Secondary | ICD-10-CM

## 2011-08-09 DIAGNOSIS — I2510 Atherosclerotic heart disease of native coronary artery without angina pectoris: Secondary | ICD-10-CM

## 2011-08-09 DIAGNOSIS — N400 Enlarged prostate without lower urinary tract symptoms: Secondary | ICD-10-CM

## 2011-08-09 LAB — POCT INR: INR: 2.3

## 2011-08-09 NOTE — Progress Notes (Signed)
[name] Date of Birth:  [birth year] [facility] / [facility] [address]   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  The patient has a history of ischemic heart disease and aortic valve disease.  He underwent previous coronary artery bypass graft surgery on 09/07/05 by Dr. Laneta Simmers.  He underwent aortic valve replacement with a tissue valve in June 2012 by Dr. Laneta Simmers.  Postoperatively he had atrial fibrillation and persisted in atrial fibrillation at the time of discharge.  He was sent home on Coumadin.  He also went home with a Foley catheter because of postoperative urinary retention.  This had happened to him on previous operations as well.  Postoperatively he had a lot of anasarca and fluid retention which gradually resolved.  He continued on Coumadin and has been therapeutic and on 08/02/11 he underwent elective outpatient direct current cardioversion of his atrial fibrillation.  He converted after the second shock using 200 J into sinus bradycardia.  He tolerated the procedure well.  Returns now for followup.  He has not been experiencing any chest pain or significant shortness of breath.  His energy level has improved since restoration of sinus rhythm.  He had a voiding trial recently with his urologist Dr. Apolinar Junes but he failed the voiding trial and had to be recatheterized and so he once again has a Foley catheter with a leg bag.  His Coumadin is therapeutic today.  He has not been expressing any hematuria.  It is anticipated that in the next several months he will need to have a TURP by his surgeon Dr. Annabell Howells.  Current Outpatient Prescriptions  Medication Sig Dispense Refill  . allopurinol (ZYLOPRIM) 100 MG tablet Take 1 tablet (100 mg total) by mouth daily.  90 tablet  3  . aspirin 81 MG tablet Take 81 mg by mouth  daily.        . diazepam (VALIUM) 5 MG tablet Take 5 mg by mouth every 6 (six) hours as needed. Taking as needed       . dutasteride (AVODART) 0.5 MG capsule Take 0.5 mg by mouth daily.        . furosemide (LASIX) 40 MG tablet Take 1 tablet (40 mg total) by mouth 2 (two) times daily.  180 tablet  3  . metoprolol tartrate (LOPRESSOR) 25 MG tablet Take 1 tablet (25 mg total) by mouth 2 (two) times daily.  180 tablet  3  . Multiple Vitamin (MULTIVITAMIN) capsule Take 1 capsule by mouth daily.        [name] spironolactone (ALDACTONE) 25 MG tablet Take 1 tablet (25 mg total) by mouth daily.  100 tablet  11  . terazosin (HYTRIN) 10 MG capsule Once a day       . traMADol (ULTRAM) 50 MG tablet Take 50 mg by mouth every 6 (six) hours as needed.        . warfarin (COUMADIN) 2.5 MG tablet Take 2.5 mg by mouth daily. Taking daily in the pm       . zolpidem (AMBIEN) 10 MG tablet Take 1 tablet (10 mg total) by mouth at bedtime as needed.  30 tablet  5    Allergies  Allergen Reactions  . Actos (Pioglitazone Hydrochloride)   . Altace   . Amiodarone   . Indocin   .  Latex   . Lotensin     ?  . Norvasc (Amlodipine Besylate)   . Orudis (Ketoprofen)   . Penicillins   . Pioglitazone     REACTION: causes CHF and edema  . Talwin   . Zoloft     Patient Active Problem List  Diagnoses  . Sarcoidosis  . OBSTRUCTIVE SLEEP APNEA  . MYOCARDIAL INFARCTION  . CORONARY HEART DISEASE  . AORTIC STENOSIS  . VALVULAR HEART DISEASE  . DYSPNEA  . Hx of CABG  . Diabetes mellitus  . Gout  . BPH (benign prostatic hyperplasia)  . Benign hypertensive heart disease without heart failure  . Chronic kidney disease (CKD), stage III (moderate)  . Atrial fibrillation with controlled ventricular response    History  Smoking status  . Former Smoker  . Types: Cigarettes, Pipe  . Quit date: 12/25/1995  Smokeless tobacco  . Not on file    History  Alcohol Use No    Family History  Problem Relation Age of Onset  .  Heart attack Father   . Allergies    . Asthma    . Heart disease    . Cancer      Review of Systems: Constitutional: no fever chills diaphoresis or fatigue or change in weight.  Head and neck: no hearing loss, no epistaxis, no photophobia or visual disturbance. Respiratory: No cough, shortness of breath or wheezing. Cardiovascular: No chest pain peripheral edema, palpitations. Gastrointestinal: No abdominal distention, no abdominal pain, no change in bowel habits hematochezia or melena. Genitourinary: No dysuria, no frequency, no urgency, no nocturia. Musculoskeletal:No arthralgias, no back pain, no gait disturbance or myalgias. Neurological: No dizziness, no headaches, no numbness, no seizures, no syncope, no weakness, no tremors. Hematologic: No lymphadenopathy, no easy bruising. Psychiatric: No confusion, no hallucinations, no sleep disturbance.    Physical Exam: Filed Vitals:   08/09/11 1214  BP: 136/80  Pulse: 64  The general appearance reveals a well-developed well-nourished gentleman in no distress.Pupils equal and reactive.   Extraocular Movements are full.  There is no scleral icterus.  The mouth and pharynx are normal.  The neck is supple.  The carotids reveal no bruits.  The jugular venous pressure is normal.  The thyroid is not enlarged.  There is no lymphadenopathy.  Pupils equal and reactive.   Extraocular Movements are full.  There is no scleral icterus.  The mouth and pharynx are normal.  The neck is supple.  The carotids reveal no bruits.  The jugular venous pressure is normal.  The thyroid is not enlarged.  There is no lymphadenopathy.    The heart reveals a faint systolic ejection murmur across the prosthetic aortic valve.  No aortic insufficiency.  No pericardial rub.  No gallop.  The rhythm is regular and EKG confirms normal sinus rhythm. The abdomen reveals no hepatosplenomegaly or mass extremities reveal no phlebitis or edema.The neurologic exam is physiologic.    integument is normal.He does have a dark nevus on his mid abdomen area anteriorly and he has an appointment to see his dermatologist soon  EKG confirms normal sinus rhythm.    Assessment / Plan: The patient is to continue same medication.  We will plan to see him in one month for office visit EKG and protime.  Consider stopping Coumadin at that time if he remains in normal sinus rhythm and then look forward to preoperative evaluation leading up to TURP.

## 2011-08-09 NOTE — Assessment & Plan Note (Signed)
Patient still has his Foley catheter in place.  Failed a voiding trial recently.  He will eventually need a TURP.  We will plan to keep him on Coumadin for another month and if you remains in normal sinus rhythm after a month we will stop his Coumadin and switch to aspirin alone.

## 2011-08-09 NOTE — Assessment & Plan Note (Signed)
The patient has not been experiencing any exertional chest pain or angina pectoris.  He has gradually been increasing his activities.

## 2011-08-09 NOTE — Assessment & Plan Note (Signed)
The patient was cardioverted on August 02, 2011.  EKG today shows that he is maintaining normal sinus rhythm.

## 2011-08-13 ENCOUNTER — Other Ambulatory Visit: Payer: Self-pay | Admitting: Surgery

## 2011-08-13 DIAGNOSIS — I359 Nonrheumatic aortic valve disorder, unspecified: Secondary | ICD-10-CM

## 2011-08-13 NOTE — Discharge Summary (Signed)
[name], [name]               ACCOUNT NO.:  [id number]  MEDICAL RECORD NO.:  [id number]  LOCATION:  2030                         FACILITY:  [facility]  PHYSICIAN:  Evelene Croon, M.D.     DATE OF BIRTH:  01-13-1941  DATE OF ADMISSION:  06/13/2011 DATE OF DISCHARGE:  06/23/2011                              DISCHARGE SUMMARY   HISTORY:  The patient is a 70 year old gentleman referred to Dr. Evelene Croon for cardiothoracic surgical opinion due to critical aortic stenosis and moderate aortic insufficiency with progressive exertional dyspnea and biventricular heart failure.  He was asked to consider redo sternotomy for aortic valve replacement.  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  He had a left internal mammary artery to the LAD with saphenous vein graft to the diagonal branch, posterior descending coronary artery, and a sequential vein graft to the first and second obtuse marginal branches.  His ejection fraction at that time was about 50%.  He had a slow postoperative recovery.  He had no history of aortic stenosis at that time.  He reports that he was subsequently diagnosed with hypercalcemia a few years ago, but ultimately had a bone marrow biopsy and axillary lymph node biopsy revealing that he had sarcoidosis.  He was treated with prednisone for about 3 years and this was discontinued in October 2011.  He was also diagnosed with obstructive sleep apnea and started on CPAP with improvement in his breathing.  Over the past several months, however, he noticed progressive exertional dyspnea as well as development of abdominal ascites.  He has gotten to the point where he had significant shortness of breath with minimal activity.  He had an echocardiogram done on April 20, 2011, which showed critical aortic stenosis with a heavily calcified aortic valve and a peak gradient 120 mmHg and a mean  gradient of 68 mmHg with a valve area measured at 0.8 cm2.  He had moderate aortic insufficiency.  Left ventricular ejection fraction was about 55-60% with mild left ventricular hypertrophy and essentially normal systolic function.  There was no regional wall motion abnormalities.  Examination of his other heart valves also showed that he had moderate pulmonary regurgitation and severe tricuspid regurgitation with pulmonary hypertension and a pulmonary peak pressure of 51 mmHg.  There was also a small pericardial effusion and a left large right pleural effusion.  The left atrium was moderately dilated.  There was mild mitral regurgitation.  Right ventricular systolic function was mildly reduced. His body surface area at that time was measured at 2.18 meters squared with a weight of 209 pounds.  It was Dr. Sharee Pimple opinion that he should undergo aortic valve replacement as well as tricuspid valve repair, and he was admitted this hospitalization for the procedure.  REVIEW OF SYMPTOMS:  Please see history and physical.  ALLERGIES: 1. PENICILLIN. 2. MINOCIN. 3. CODEINE. 4. LASIX. 5. ACTOS. 6. AMIODARONE.  MEDICATIONS PRIOR TO ADMISSION: 1. Allopurinol 300 mg p.o. daily. 2. Aspirin 81 mg daily. 3. Lanoxin 0.25 mg daily. 4. Lasix 80 mg b.i.d. 5. Lopressor 50 mg b.i.d. 6. Hytrin 10 mg daily. 7. Ambien 10  mg q.p.m. 8. Imdur 120 mg daily. 9. Aldactone 25 mg daily. 10.Valium 5 mg nightly p.r.n.  PAST MEDICAL HISTORY:  Includes the following: 1. Diabetes. 2. Hypertension. 3. Congestive heart failure. 4. Previous myocardial infarction. 5. Coronary artery bypass grafting as described above in 29-May-2005. 6. Critical aortic stenosis. 7. Moderate aortic insufficiency. 8. Severe tricuspid regurgitation. 9. Moderate pulmonary insufficiency. 10.History of BPH. 11.History of gout. 12.History of sleep apnea. 13.History of sarcoidosis. 14.History of cataract surgery.  FAMILY HISTORY:  His  father died of a myocardial infarction at age 56. Mother died of old age at age 65.  SOCIAL HISTORY:  He is married and lives with his wife.  He is a retired Development worker, community.  He has 2 children who are living and 2 that are deceased. He quit smoking in 05-29-94 and only smoked a pipe.  He denied alcohol abuse.  PHYSICAL EXAM:  Please see the history and physical done at the time of admission.  HOSPITAL COURSE:  The patient was admitted electively and on June 13, 2011, underwent redo sternotomy for aortic valve replacement with a 28- mm Magna Ease pericardial tissue valve.  Additionally, he underwent tricuspid valve repair with a 34-mm Edwards MC3 ring annuloplasty.  He tolerated the procedure well and was taken to the surgical intensive care unit in stable condition.  POSTOPERATIVE HOSPITAL COURSE:  He has progressed slowly postoperatively.  He did have an acute exacerbation of his renal insufficiency and was seen in Nephrology consultation by Dr. Allena Katz and associates.  He has had all routine lines, monitors, and drainage devices discontinued in the standard fashion.  He has required aggressive diuresis.  He has had aggressive management of his blood sugars postoperative using standard protocols.  He has tolerated advancement of activities using standard postsurgical protocols.  He has had postoperative atrial fibrillation and has been started on Coumadin. He has had postoperative acute blood loss anemia as well as exacerbation of chronic anemia.  This has been monitored closely.  He has been treated with Aranesp.  He had a postoperative thrombocytopenia as well and this has improved over time.  His most recent platelet count dated June 20, 2011, is 130,000.  His most recent hemoglobin and hematocrit dated June 20, 2011, are 7.5 and 22.9 respectively.  His most recent INR dated June 22, 2011, is 2.42.  He has been started on calcium by Nephrology.  His most recent BUN and creatinine dated  June 22, 2011, are 49 and 1.52 respectively.  He has not required dialysis.  He has had difficulty with postoperative urinary retention and has required replacement of his Foley catheter on 2 occasions.  Currently, he is tentatively planned that he will go home with a urinary catheter with followup with his urologist as an outpatient.  Overall, his status is felt to be tentatively stable for discharge in the next day or so.  MEDICATIONS ON DISCHARGE:  At the time of this dictation include the following: 1. Calcitriol 0.25 mcg p.o. daily. 2. Iron complex 150 mg p.o. daily. 3. Potassium chloride 20 mEq daily. 4. Ultram 50 mg one every 6 hours p.r.n. for pain. 5. Allopurinol 100 mg q.a.m. 6. Lopressor 25 mg b.i.d. 7. Ambien 5 mg 1-2 every evening p.r.n. 8. Enteric-coated aspirin 81 mg p.o. daily. 9. Benadryl p.r.n. for allergy/congestion symptoms. 10.Colace 100 mg daily at bedtime. 11.Diazepam 5 mg 1-2 at bedtime p.r.n. 12.Hytrin 10 mg q.a.m. 13.Lasix 80 mg b.i.d. 14.Testosterone cypionate 200 mcg intramuscular monthly.  INSTRUCTIONS:  The patient  received written instructions regarding medications, activity, diet, wound care.  Followup includes Dr. Patty Sermons in 2 weeks, Dr. Laneta Simmers 3 weeks, Nephrology 4 weeks, Dr. Annabell Howells July 9 at 8:00 a.m. for voiding trial.  Dr. Sharl Ma followup for diabetes management.  FINAL DIAGNOSES: 1. Severe aortic stenosis and moderate aortic insufficiency, now     status post aortic valve replacement. 2. History of congestive heart failure. 3. History of chronic kidney disease stage III with postoperative     exacerbation of acute injury. 4. Congestive heart failure. 5. History of coronary artery disease. 6. History of sarcoidosis. 7. History of hypocalcemia related to sarcoidosis. 8. History of sleep apnea on chronic CPAP. 9. History of diabetes mellitus type 2. 10.History of hypertension. 11.History of benign prostatic hyperplasia. 12.History of  hemorrhagic cystitis. 13.History of gout. 14.History of cataract repair. 15.History of postoperative atrial fibrillation following his bypass. 16.History of chronic thrombocytopenia. 17.Postoperative urinary retention. 18.History of gout.     Rowe Clack, P.A.-C.   ______________________________ Evelene Croon, M.D.    Sherryll Burger  D:  06/22/2011  T:  06/23/2011  Job:  [id number]  cc:   [name] L. Foy Gu[place]ara, M.D. Tonita Cong, M.D. Clinton D. Maple Hudson, MD, FCCP, FACP Zetta Bills, MD Excell Seltzer. Annabell Howells, M.D.  Electronically Signed by Gershon Crane P.A.-C. on 07/03/2011 02:22:10 PM Electronically Signed by Evelene Croon M.D. on 08/13/2011 03:54:56 PM

## 2011-08-14 ENCOUNTER — Ambulatory Visit (INDEPENDENT_AMBULATORY_CARE_PROVIDER_SITE_OTHER): Payer: Self-pay | Admitting: Surgery

## 2011-08-14 ENCOUNTER — Encounter: Payer: Self-pay | Admitting: Surgery

## 2011-08-14 ENCOUNTER — Encounter: Payer: Medicare Other | Admitting: Surgery

## 2011-08-14 ENCOUNTER — Ambulatory Visit
Admission: RE | Admit: 2011-08-14 | Discharge: 2011-08-14 | Disposition: A | Discharge status: Home / Self Care | Payer: Medicare Other | Source: Ambulatory Visit | Attending: Surgery | Admitting: Surgery

## 2011-08-14 VITALS — BP 152/72 | HR 62 | Resp 18

## 2011-08-14 DIAGNOSIS — I359 Nonrheumatic aortic valve disorder, unspecified: Secondary | ICD-10-CM

## 2011-08-14 DIAGNOSIS — Z952 Presence of prosthetic heart valve: Secondary | ICD-10-CM | POA: Insufficient documentation

## 2011-08-14 DIAGNOSIS — I071 Rheumatic tricuspid insufficiency: Secondary | ICD-10-CM

## 2011-08-14 DIAGNOSIS — I350 Nonrheumatic aortic (valve) stenosis: Secondary | ICD-10-CM

## 2011-08-14 DIAGNOSIS — I079 Rheumatic tricuspid valve disease, unspecified: Secondary | ICD-10-CM

## 2011-08-14 DIAGNOSIS — I4891 Unspecified atrial fibrillation: Secondary | ICD-10-CM

## 2011-08-14 DIAGNOSIS — Z954 Presence of other heart-valve replacement: Secondary | ICD-10-CM

## 2011-08-15 ENCOUNTER — Encounter: Payer: Self-pay | Admitting: Surgery

## 2011-08-15 ENCOUNTER — Other Ambulatory Visit: Payer: Self-pay | Admitting: *Deleted

## 2011-08-15 MED ORDER — TERAZOSIN HCL 10 MG PO CAPS: 10.0000 mg | ORAL_CAPSULE | Freq: Every day | ORAL | Status: DC

## 2011-08-15 NOTE — Telephone Encounter (Signed)
Refilled terazosin. 

## 2011-08-15 NOTE — Progress Notes (Signed)
  HPI  Patient returns for routine postoperative follow-up having undergone redo median sternotomy with aortic valve replacement using a 23 mm Edwards pericardial valve and tricuspid valve repair with an annuloplasty ring on 06/13/2011. The patient's early postoperative recovery while in the hospital was notable for postoperative atrial fibrillation. He remained in atrial fibrillation and his rate was controlled with medication. He was started on Coumadin. He also developed urinary retention postoperatively due to BPH and was discharged home with a Foley catheter in place. He underwent cardioversion about 2 weeks ago by Dr. Patty Sermons. If he maintains sinus rhythm the plan is to get him off his Coumadin so that he can undergo TURP for his BPH. He said that he feels much better since surgery. His abdominal ascites and lower extremity  edema have resolved. He has been ambulating without chest pain or shortness of breath..     Current outpatient prescriptions:allopurinol (ZYLOPRIM) 100 MG tablet, Take 1 tablet (100 mg total) by mouth daily., Disp: 90 tablet, Rfl: 3;  aspirin 81 MG tablet, Take 81 mg by mouth daily.  , Disp: , Rfl: ;  diazepam (VALIUM) 5 MG tablet, Take 5 mg by mouth every 6 (six) hours as needed. Taking as needed , Disp: , Rfl: ;  dutasteride (AVODART) 0.5 MG capsule, Take 0.5 mg by mouth daily.  , Disp: , Rfl:  furosemide (LASIX) 40 MG tablet, Take 1 tablet (40 mg total) by mouth 2 (two) times daily., Disp: 180 tablet, Rfl: 3;  metoprolol tartrate (LOPRESSOR) 25 MG tablet, Take 1 tablet (25 mg total) by mouth 2 (two) times daily., Disp: 180 tablet, Rfl: 3;  Multiple Vitamin (MULTIVITAMIN) capsule, Take 1 capsule by mouth daily.  , Disp: , Rfl: ;  spironolactone (ALDACTONE) 25 MG tablet, Take 1 tablet (25 mg total) by mouth daily., Disp: 100 tablet, Rfl: 11 terazosin (HYTRIN) 10 MG capsule, Once a day , Disp: , Rfl: ;  traMADol (ULTRAM) 50 MG tablet, Take 50 mg by mouth every 6 (six) hours as  needed.  , Disp: , Rfl: ;  warfarin (COUMADIN) 2.5 MG tablet, Take 2.5 mg by mouth daily. Taking daily in the pm , Disp: , Rfl: ;  zolpidem (AMBIEN) 10 MG tablet, Take 1 tablet (10 mg total) by mouth at bedtime as needed., Disp: 30 tablet, Rfl: 5    Physical Exam  He looks well. Cardiac exam shows a regular rate and rhythm with a soft systolic flow murmur across his aortic valve prosthesis. There is no diastolic murmur. Lungs clear. There is no peripheral edema.  Diagnostic tests:  Chest x-ray shows clear lung fields and no pleural effusions.  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 All of his symptoms have resolved and he has returned to an active lifestyle. His only complaint is that he still has a Foley catheter in, which is irritating.  Plan:  He will continue to followup with Dr. Patty Sermons. Dr. Bjorn Pippin is managing his BPH and Foley catheter. He will return to see me if he develops any problems with his incisions.

## 2011-09-05 ENCOUNTER — Ambulatory Visit (INDEPENDENT_AMBULATORY_CARE_PROVIDER_SITE_OTHER): Payer: Medicare Other | Admitting: *Deleted

## 2011-09-05 ENCOUNTER — Telehealth: Payer: Self-pay | Admitting: Cardiology

## 2011-09-05 NOTE — Telephone Encounter (Signed)
Nose bleed, coming for Inr

## 2011-09-05 NOTE — Telephone Encounter (Signed)
Thinks he may need his coumadin checked before 9-18, pls call

## 2011-09-06 ENCOUNTER — Telehealth: Payer: Self-pay | Admitting: Internal Medicine

## 2011-09-06 ENCOUNTER — Encounter: Payer: Self-pay | Admitting: Cardiology

## 2011-09-06 DIAGNOSIS — G4733 Obstructive sleep apnea (adult) (pediatric): Secondary | ICD-10-CM

## 2011-09-06 NOTE — Telephone Encounter (Signed)
Spoke with pt. He states willing to have ONO done. I have confirmed with CDY that this is with BIPAP and no O2. Order sent to [facility]

## 2011-09-06 NOTE — Telephone Encounter (Signed)
We can discontinue it directly. If there is any question, it would be safer to order an ONOX on room air if he agrees.

## 2011-09-06 NOTE — Telephone Encounter (Signed)
I spoke with Dr. Jimmey Ralph and he states since he had his aortic valve replaced 3 months ago he has not been using his oxygen. He states his saturations have been staying between 95-100% since the surgery at rest and with activity.  He states he has been using his bipap at night but not his oxygen either. He is requesting to have order sent to [facility] to have oxygen d/c. Pt states he will come in for OV if needed. Please advise. Carron Curie, CMA

## 2011-09-11 ENCOUNTER — Ambulatory Visit (INDEPENDENT_AMBULATORY_CARE_PROVIDER_SITE_OTHER): Payer: Medicare Other | Admitting: Cardiology

## 2011-09-11 ENCOUNTER — Encounter (INDEPENDENT_AMBULATORY_CARE_PROVIDER_SITE_OTHER): Payer: Medicare Other | Admitting: *Deleted

## 2011-09-11 ENCOUNTER — Encounter: Payer: Self-pay | Admitting: Cardiology

## 2011-09-11 DIAGNOSIS — N400 Enlarged prostate without lower urinary tract symptoms: Secondary | ICD-10-CM

## 2011-09-11 DIAGNOSIS — R0602 Shortness of breath: Secondary | ICD-10-CM

## 2011-09-11 DIAGNOSIS — Z9889 Other specified postprocedural states: Secondary | ICD-10-CM

## 2011-09-11 DIAGNOSIS — Z952 Presence of prosthetic heart valve: Secondary | ICD-10-CM

## 2011-09-11 DIAGNOSIS — E119 Type 2 diabetes mellitus without complications: Secondary | ICD-10-CM

## 2011-09-11 DIAGNOSIS — Z951 Presence of aortocoronary bypass graft: Secondary | ICD-10-CM

## 2011-09-11 NOTE — Patient Instructions (Signed)
We will have you stop your spironolactone and start losartan. We will ask you to get a basal metabolic panel when you see your endocrinologist in 2 weeks to be sure your potassium is doing okay. We're decreasing your furosemide to 20 mg daily. Stop Coumadin at this point and continue baby aspirin We prescribed Valium 10 mg for you to have a p.r.n. Basis one daily p.r.n. For anxiety and stress.

## 2011-09-11 NOTE — Assessment & Plan Note (Signed)
The patient is anxious to proceed with his TURP.  At this point we can stop his Coumadin which was being used for postoperative atrial fibrillation.  He remains in normal sinus rhythm.  We will switch him to a baby aspirin alone.  From a cardiac standpoint he is cleared for urologic surgery whenever it can be scheduled by his urologist.

## 2011-09-11 NOTE — Assessment & Plan Note (Signed)
His dyspnea has improved further and he does not have any evidence of fluid retention at this point and we are decreasing his  Lasix to 20 mg daily.We are also stopping his Aldactone and started him on losartan which he has at home

## 2011-09-11 NOTE — Assessment & Plan Note (Signed)
He has not been having any hypoglycemic episodes.

## 2011-09-11 NOTE — Progress Notes (Signed)
[name] Date of Birth:  05-Mar-1941 [facility] / [facility] [address]   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  He has a complex past medical history.  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  He underwent coronary artery bypass graft surgery on 09/07/05 by Dr. Laneta Simmers.  He underwent aortic valve replacement with a tissue valve in June 2012 by Dr. Laneta Simmers.  Postoperatively he had atrial fibrillation.  He remains on Coumadin.  On 08/02/11 he underwent successful elective outpatient direct current cardioversion of his atrial fibrillation.  He required 2 shocks and was converted after using 200 J.  He returns now and he is still in normal sinus rhythm.  He has BPH and presently has an indwelling Foley with a Leg bag.  He is in need of a TURP procedure.  Current Outpatient Prescriptions  Medication Sig Dispense Refill  . allopurinol (ZYLOPRIM) 100 MG tablet Take 1 tablet (100 mg total) by mouth daily.  90 tablet  3  . aspirin 81 MG tablet Take 81 mg by mouth daily.        . diazepam (VALIUM) 5 MG tablet Take 5 mg by mouth every 6 (six) hours as needed. Taking as needed       . dutasteride (AVODART) 0.5 MG capsule Take 0.5 mg by mouth daily.        . furosemide (LASIX) 40 MG tablet Take 1 tablet (40 mg total) by mouth 2 (two) times daily.  180 tablet  3  . metoprolol tartrate (LOPRESSOR) 25 MG tablet Take 1 tablet (25 mg total) by mouth 2 (two) times daily.  180 tablet  3  . Multiple Vitamin (MULTIVITAMIN) capsule Take 1 capsule by mouth daily.        [name] spironolactone (ALDACTONE) 25 MG tablet Take 1 tablet (25 mg total) by mouth daily.  100 tablet  11  . terazosin (HYTRIN) 10 MG capsule Take 1 capsule (10 mg total) by mouth daily. Once a day  90 capsule  3  . traMADol (ULTRAM) 50 MG tablet Take 50 mg  by mouth every 6 (six) hours as needed.        . warfarin (COUMADIN) 2.5 MG tablet Take 2.5 mg by mouth daily. Taking daily in the pm       . zolpidem (AMBIEN) 10 MG tablet Take 1 tablet (10 mg total) by mouth at bedtime as needed.  30 tablet  5    Allergies  Allergen Reactions  . Actos (Pioglitazone Hydrochloride)   . Altace   . Amiodarone   . Indocin   . Latex   . Lotensin     ?  . Norvasc (Amlodipine Besylate)   . Orudis (Ketoprofen)   . Penicillins   . Pioglitazone     REACTION: causes CHF and edema  . Talwin   . Zoloft     Patient Active Problem List  Diagnoses  . Sarcoidosis  . OBSTRUCTIVE SLEEP APNEA  . MYOCARDIAL INFARCTION  . CORONARY HEART DISEASE  . AORTIC STENOSIS  . VALVULAR HEART DISEASE  . DYSPNEA  . Hx of CABG  . Diabetes mellitus  . Gout  . BPH (benign prostatic hyperplasia)  . Benign hypertensive heart disease without heart failure  . Chronic kidney disease (CKD), stage  III (moderate)  . Atrial fibrillation with controlled ventricular response  . Severe tricuspid regurgitation by prior echocardiogram  . S/P AVR (aortic valve replacement), Tricuspid Valve repair    History  Smoking status  . Former Smoker  . Types: Cigarettes, Pipe  . Quit date: 12/25/1995  Smokeless tobacco  . Not on file    History  Alcohol Use No    Family History  Problem Relation Age of Onset  . Heart attack Father   . Allergies    . Asthma    . Heart disease    . Cancer      Review of Systems: Constitutional: no fever chills diaphoresis or fatigue or change in weight.  Head and neck: no hearing loss, no epistaxis, no photophobia or visual disturbance. Respiratory: No cough, shortness of breath or wheezing. Cardiovascular: No chest pain peripheral edema, palpitations. Gastrointestinal: No abdominal distention, no abdominal pain, no change in bowel habits hematochezia or melena. Genitourinary: No dysuria, no frequency, no urgency, no  nocturia. Musculoskeletal:No arthralgias, no back pain, no gait disturbance or myalgias. Neurological: No dizziness, no headaches, no numbness, no seizures, no syncope, no weakness, no tremors. Hematologic: No lymphadenopathy, no easy bruising. Psychiatric: No confusion, no hallucinations, no sleep disturbance.    Physical Exam: Filed Vitals:   09/11/11 2210  BP: 140/70  Pulse: 60   The general appearance reveals a well-developed well-nourished gentleman in no distress.Pupils equal and reactive.   Extraocular Movements are full.  There is no scleral icterus.  The mouth and pharynx are normal.  The neck is supple.  The carotids reveal no bruits.  The jugular venous pressure is normal.  The thyroid is not enlarged.  There is no lymphadenopathy.  The chest is clear to percussion and auscultation. There are no rales or rhonchi. Expansion of the chest is symmetrical.    The heart reveals a regular rhythm at 60 per minute and there is no aortic insufficiency.  No gallop.  The abdomen is soft and nontender. Bowel sounds are normal. The liver and spleen are not enlarged. There Are no abdominal masses. There are no bruits.  The pedal pulses are good.  There is no phlebitis or edema.  There is no cyanosis or clubbing.Strength is normal and symmetrical in all extremities.  There is no lateralizing weakness.  There are no sensory deficits.  The skin is warm and dry.  There is no rash.He is wearing a leg bag and has a Foley in place      Assessment / Plan:

## 2011-09-11 NOTE — Assessment & Plan Note (Signed)
The patient has had no recurrent angina pectoris.  Last week he had to walk almost 1/2 a mile home from the neighborhood garage because his car wouldn't start.  He was surprised to see how well he did walking a long distance.

## 2011-09-20 ENCOUNTER — Telehealth: Payer: Self-pay | Admitting: Cardiology

## 2011-09-20 NOTE — Telephone Encounter (Signed)
Call Back Phone#: [phone], Last OV, EKG, STRESS, ECHO

## 2011-09-21 NOTE — Telephone Encounter (Signed)
Faxed Out/ Finished °

## 2011-09-24 ENCOUNTER — Telehealth: Payer: Self-pay | Admitting: Internal Medicine

## 2011-09-24 HISTORY — PX: TRANSURETHRAL RESECTION OF PROSTATE: SHX73

## 2011-09-24 NOTE — Telephone Encounter (Signed)
Pt is requesting ONO results done x 2 weeks ago. Please advise if you have these results. Thanks. Carron Curie, CMA

## 2011-09-25 ENCOUNTER — Other Ambulatory Visit: Payer: Self-pay | Admitting: Urology

## 2011-09-25 ENCOUNTER — Encounter (HOSPITAL_COMMUNITY): Payer: Medicare Other

## 2011-09-25 LAB — PROTIME-INR: Prothrombin Time: 14.6 seconds (ref 11.6–15.2)

## 2011-09-25 LAB — CBC
MCH: 27.5 pg (ref 26.0–34.0)
MCHC: 33 g/dL (ref 30.0–36.0)
MCV: 83.3 fL (ref 78.0–100.0)
Platelets: 121 10*3/uL — ABNORMAL LOW (ref 150–400)
RBC: 4.48 MIL/uL (ref 4.22–5.81)
RDW: 16.8 % — ABNORMAL HIGH (ref 11.5–15.5)

## 2011-09-25 LAB — COMPREHENSIVE METABOLIC PANEL
ALT: 16 U/L (ref 0–53)
AST: 19 U/L (ref 0–37)
Albumin: 3.6 g/dL (ref 3.5–5.2)
CO2: 28 mEq/L (ref 19–32)
Calcium: 9.8 mg/dL (ref 8.4–10.5)
Chloride: 101 mEq/L (ref 96–112)
Creatinine, Ser: 1.77 mg/dL — ABNORMAL HIGH (ref 0.50–1.35)
Sodium: 137 mEq/L (ref 135–145)
Total Bilirubin: 0.3 mg/dL (ref 0.3–1.2)

## 2011-09-25 LAB — APTT: aPTT: 36 seconds (ref 24–37)

## 2011-09-25 LAB — SURGICAL PCR SCREEN: Staphylococcus aureus: NEGATIVE

## 2011-09-25 NOTE — Telephone Encounter (Signed)
Overnight oximetry looked good, with normal oxygenation. We are checking with [facility] because of a question about the testing format. Was he wearing oxygen or BIPAP/ CPAP for this study?[name]

## 2011-09-25 NOTE — Telephone Encounter (Signed)
I spoke with patient about results and he verbalized understanding and had no questions. Pt states he was wearing his bpap during the study. Please advise Thanks  Carver Fila, CMA

## 2011-09-25 NOTE — Telephone Encounter (Signed)
Overnight oximetry from [facility] done 09/11/11 shows excellent oxygenation with no time below 90% sat. He doesn't need oxygen at night.

## 2011-09-26 LAB — TESTOSTERONE: Testosterone: 293.76 ng/dL (ref 250–890)

## 2011-09-26 LAB — HEMOGLOBIN A1C: Hgb A1c MFr Bld: 7.1 % — ABNORMAL HIGH (ref <5.7)

## 2011-09-27 ENCOUNTER — Inpatient Hospital Stay (HOSPITAL_COMMUNITY)
Admission: RE | Admit: 2011-09-27 | Discharge: 2011-09-29 | DRG: 713 | Disposition: A | Discharge status: Home / Self Care | Payer: Medicare Other | Source: Ambulatory Visit | Attending: Urology | Admitting: Urology

## 2011-09-27 ENCOUNTER — Other Ambulatory Visit: Payer: Self-pay | Admitting: Urology

## 2011-09-27 DIAGNOSIS — R339 Retention of urine, unspecified: Secondary | ICD-10-CM | POA: Diagnosis present

## 2011-09-27 DIAGNOSIS — I252 Old myocardial infarction: Secondary | ICD-10-CM

## 2011-09-27 DIAGNOSIS — Y838 Other surgical procedures as the cause of abnormal reaction of the patient, or of later complication, without mention of misadventure at the time of the procedure: Secondary | ICD-10-CM | POA: Diagnosis not present

## 2011-09-27 DIAGNOSIS — Z951 Presence of aortocoronary bypass graft: Secondary | ICD-10-CM

## 2011-09-27 DIAGNOSIS — I4891 Unspecified atrial fibrillation: Secondary | ICD-10-CM | POA: Diagnosis present

## 2011-09-27 DIAGNOSIS — N138 Other obstructive and reflux uropathy: Principal | ICD-10-CM | POA: Diagnosis present

## 2011-09-27 DIAGNOSIS — N320 Bladder-neck obstruction: Secondary | ICD-10-CM | POA: Diagnosis present

## 2011-09-27 DIAGNOSIS — I2510 Atherosclerotic heart disease of native coronary artery without angina pectoris: Secondary | ICD-10-CM | POA: Diagnosis present

## 2011-09-27 DIAGNOSIS — N401 Enlarged prostate with lower urinary tract symptoms: Principal | ICD-10-CM | POA: Diagnosis present

## 2011-09-27 DIAGNOSIS — Z01812 Encounter for preprocedural laboratory examination: Secondary | ICD-10-CM

## 2011-09-27 DIAGNOSIS — Z954 Presence of other heart-valve replacement: Secondary | ICD-10-CM

## 2011-09-27 DIAGNOSIS — I10 Essential (primary) hypertension: Secondary | ICD-10-CM | POA: Diagnosis present

## 2011-09-27 DIAGNOSIS — I498 Other specified cardiac arrhythmias: Secondary | ICD-10-CM | POA: Diagnosis present

## 2011-09-27 DIAGNOSIS — IMO0002 Reserved for concepts with insufficient information to code with codable children: Secondary | ICD-10-CM | POA: Diagnosis not present

## 2011-09-27 LAB — GLUCOSE, CAPILLARY: Glucose-Capillary: 162 mg/dL — ABNORMAL HIGH (ref 70–99)

## 2011-09-28 NOTE — Op Note (Signed)
[name], [name]               ACCOUNT NO.:  [id number]  MEDICAL RECORD NO.:  [id number]  LOCATION:  1414                         FACILITY:  [facility]  PHYSICIAN:  Excell Seltzer. Annabell Howells, M.D.    DATE OF BIRTH:  [birth year]  DATE OF PROCEDURE:  09/27/2011 DATE OF DISCHARGE:                              OPERATIVE REPORT   PROCEDURES:  Cystoscopy, bladder biopsy, and transurethral resection of the prostate with a Gyrus bipolar equipment.  PREOPERATIVE DIAGNOSIS:  Bladder lesion, benign prostatic hypertrophy with retention.  POSTOPERATIVE DIAGNOSIS:  Bladder lesion, benign prostatic hypertrophy with retention.  SURGEON:  Excell Seltzer. Annabell Howells, MD  ANESTHESIA:  General.  SPECIMEN:  Bladder biopsy and prostate chips.  DRAIN:  22-French silicone Foley catheter.  COMPLICATIONS:  None.  BLOOD LOSS:  200 cc.  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 He had undergone a heart valve earlier this year, was on Coumadin and has had Foley catheter drainage for approximately 3 months until he could come off his Coumadin for the procedure.  Office cystoscopy demonstrated a little bladder lesion on the posterior wall that had a papillary appearance, but it was felt to be most consistent with catheter irritation, but the biopsy was felt to be indicated.  He has trilobar hyperplasia with obstruction.  FINDINGS AT PROCEDURE:  He had been placed on Levaquin 2 days prior to the procedure and was given Cipro and vancomycin for SBE perioperative prophylaxis.  He was taken to the operating room where general anesthetic was induced and he was fitted with PAS hose, placed in lithotomy position.  He was prepped with Betadine solution and draped in usual sterile fashion.  A time-out was performed.  Cystoscopy was performed using a 22-French scope and 12-degree lens. Examination revealed a normal urethra.  The  external sphincter was intact.  The prostatic urethra had trilobar hyperplasia with a markedly enlarged middle lobe.  The ureteral orifices were identified away from the bladder neck.  On the posterior wall, there was some edematous erythema most consistent with catheter irritation, but it was felt a biopsy was indicated for completeness.  No other lesions were noted. The bladder did have moderate trabeculation.  Once the cystoscopy had been performed, a cup biopsy forceps was used to take a single biopsy from the erythematous area on the posterior wall.  The 28-French continuous flow resectoscope sheath was inserted.  This was fitted with an Wandra Scot handle with a standard Gyrus loop and 12- degree lens.  The biopsy site was then fulgurated.  At this point, resection of the middle lobe was initiated and the middle lobe was found to be quite large, but it was resected down the bladder neck fibers.  The floor of the prostate was then resected out to and alongside the verumontanum.  At this point, an initial chip evacuation was performed and inspection revealed the ureteral orifices well away from the bladder neck.  At this point, the right lobe of the prostate was resected from bladder neck to apex out to capsular fibers at the bladder neck.  Once adequate right lobe resection was performed, the left  lobe was resected.  It was not as bulky as the right lobe.  Once both lateral lobes were resected, the bladder was evacuated free of chips.  Once again, some residual apical and anterior tissue was then resected and those chips were removed.  Final hemostasis was achieved and final inspection of the bladder revealed no retained chips, intact ureteral orifices, and no significant bleeding.  At this point, the resectoscope was removed and pressure on the bladder produced an excellent stream.  A 22-French 3-way silicone catheter was placed per urethra.  The balloon was filled with 30 cc  of sterile fluid and the catheter was irrigated with clear return and placed to continuous irrigation with saline and attached to a drainage bag.  At this point the patient was taken down from lithotomy position.  His anesthetic was reversed.  He was moved to the recovery room in stable condition.  There were no complications during the procedure.     Excell Seltzer. Annabell Howells, M.D.     JJW/MEDQ  D:  09/27/2011  T:  09/27/2011  Job:  [id number]  cc:   Cassell Clement, M.D. Fax: [phone]  Alfonse Alpers. Dagoberto Ligas, M.D. Fax: [phone]  Electronically Signed by Bjorn Pippin M.D. on 09/28/2011 01:04:17 PM

## 2011-10-01 LAB — CBC
HCT: 29.8 — ABNORMAL LOW
HCT: 32.7 — ABNORMAL LOW
HCT: 33.8 — ABNORMAL LOW
Hemoglobin: 10.2 — ABNORMAL LOW
Hemoglobin: 11.3 — ABNORMAL LOW
MCHC: 33.8
MCHC: 34.3
MCV: 87.6
MCV: 87.9
MCV: 88
Platelets: 132 — ABNORMAL LOW
Platelets: 165
RBC: 3.39 — ABNORMAL LOW
RBC: 3.73 — ABNORMAL LOW
RDW: 15.3
RDW: 15.6 — ABNORMAL HIGH
WBC: 5.7
WBC: 6.9

## 2011-10-01 LAB — COMPREHENSIVE METABOLIC PANEL
AST: 19
Albumin: 3 — ABNORMAL LOW
Albumin: 3.2 — ABNORMAL LOW
Alkaline Phosphatase: 49
Alkaline Phosphatase: 56
BUN: 11
BUN: 19
BUN: 24 — ABNORMAL HIGH
CO2: 24
CO2: 27
Calcium: 10.1
Calcium: 12.1 — ABNORMAL HIGH
Chloride: 104
Chloride: 107
Chloride: 98
Creatinine, Ser: 1.48
Creatinine, Ser: 1.52 — ABNORMAL HIGH
Creatinine, Ser: 1.92 — ABNORMAL HIGH
GFR calc Af Amer: 57 — ABNORMAL LOW
GFR calc Af Amer: 60 — ABNORMAL LOW
GFR calc non Af Amer: 35 — ABNORMAL LOW
GFR calc non Af Amer: 47 — ABNORMAL LOW
Glucose, Bld: 100 — ABNORMAL HIGH
Glucose, Bld: 331 — ABNORMAL HIGH
Potassium: 3.2 — ABNORMAL LOW
Potassium: 3.4 — ABNORMAL LOW
Potassium: 4
Sodium: 136
Total Bilirubin: 0.8
Total Bilirubin: 1
Total Bilirubin: 1
Total Protein: 6.2
Total Protein: 6.8

## 2011-10-01 LAB — BASIC METABOLIC PANEL
BUN: 14
BUN: 22
CO2: 23
CO2: 27
Calcium: 11 — ABNORMAL HIGH
GFR calc non Af Amer: 44 — ABNORMAL LOW
GFR calc non Af Amer: 46 — ABNORMAL LOW
Glucose, Bld: 103 — ABNORMAL HIGH
Glucose, Bld: 117 — ABNORMAL HIGH
Potassium: 3.6
Sodium: 138

## 2011-10-01 LAB — APTT: aPTT: 41 — ABNORMAL HIGH

## 2011-10-01 LAB — URINE MICROSCOPIC-ADD ON

## 2011-10-01 LAB — DIFFERENTIAL
Basophils Absolute: 0
Basophils Absolute: 0
Basophils Relative: 0
Basophils Relative: 0
Eosinophils Absolute: 0.1 — ABNORMAL LOW
Eosinophils Relative: 2
Lymphocytes Relative: 10 — ABNORMAL LOW
Lymphocytes Relative: 6 — ABNORMAL LOW
Lymphs Abs: 0.4 — ABNORMAL LOW
Monocytes Absolute: 0.4
Monocytes Relative: 6
Neutro Abs: 4.3
Neutro Abs: 5.9
Neutrophils Relative %: 87 — ABNORMAL HIGH

## 2011-10-01 LAB — MAGNESIUM: Magnesium: 1 — ABNORMAL LOW

## 2011-10-01 LAB — URINALYSIS, ROUTINE W REFLEX MICROSCOPIC
Bilirubin Urine: NEGATIVE
Glucose, UA: NEGATIVE
Ketones, ur: NEGATIVE
Leukocytes, UA: NEGATIVE
Nitrite: NEGATIVE
Protein, ur: 100 — AB
pH: 6

## 2011-10-01 LAB — BONE MARROW EXAM

## 2011-10-01 LAB — PHOSPHORUS: Phosphorus: 2.2 — ABNORMAL LOW

## 2011-10-01 LAB — PROTIME-INR: Prothrombin Time: 14.4

## 2011-10-02 ENCOUNTER — Encounter: Payer: Self-pay | Admitting: Internal Medicine

## 2011-10-09 NOTE — Telephone Encounter (Signed)
Per CY-this has been addressed by him and okay to sign off on phone message.

## 2011-10-18 ENCOUNTER — Telehealth: Payer: Self-pay | Admitting: Internal Medicine

## 2011-10-18 DIAGNOSIS — J9690 Respiratory failure, unspecified, unspecified whether with hypoxia or hypercapnia: Secondary | ICD-10-CM

## 2011-10-18 NOTE — Telephone Encounter (Signed)
[facility] Please DC [facility].   Dx Respiratory Failure

## 2011-10-18 NOTE — Telephone Encounter (Signed)
No order was sent on Oct 2 to d/c o2.  Therefore sent order to [facility] today.  Called and spoke with pt. And he is aware.

## 2011-10-18 NOTE — Telephone Encounter (Signed)
Called and spoke with pt's wife, Steward Drone.  Steward Drone states she was still waiting to hear back from [facility] regarding ONO results.  Steward Drone states pt hasn't worn his oxygen since July and would like order sent to [facility] to d/c.  Pt does still continue to use bipap at night.  Cy, please advise if ok to d/c oxygen.

## 2011-10-23 NOTE — Discharge Summary (Signed)
NAMEAKIVA, BRASSFIELD               ACCOUNT NO.:  [id number]  MEDICAL RECORD NO.:  [id number]  LOCATION:  1414                         FACILITY:  [facility]  PHYSICIAN:  Excell Seltzer. Annabell Howells, M.D.    DATE OF BIRTH:  1941/06/02  DATE OF ADMISSION:  09/27/2011 DATE OF DISCHARGE:  09/29/2011                              DISCHARGE SUMMARY   Briefly, Corney is a 70 year old white male with BPH and bladder outlet obstruction and chronic retention who is to undergo transurethral resection of the prostate.  He had been on Coumadin for cardiac valve and required Foley catheter for several months until he could come off of this.  PAST HISTORY:  Significant for: 1. Prior hemorrhagic cystitis. 2. Myocardial infarction. 3. Sleep apnea. 4. Arthritis. 5. Ascites. 6. Atrial fibrillation. 7. Congestive heart failure. 8. History of hepatitis A. 9. Hypertension. 10.History of renal failure. 11.History of irregular heartbeat. 12.History of sarcoidosis.  SURGICAL HISTORY:  Pertinent for: 1. Aortic valve replacement. 2. Axillary lymphadenectomy. 3. Bone marrow biopsy. 4. Coronary artery bypass graft. 5. Cataract surgery. 6. Vasectomy. 7. Transurethral needle ablation of the prostate.  MEDICATIONS ON ADMISSION:  Include: 1. Allopurinol 100 mg daily. 2. Aspirin 81 mg daily. 3. Avodart 0.5 mg daily. 4. Cozaar 100 mg daily. 5. Diazepam 10 mg as needed. 6. Hytrin 10 mg daily. 7. Levofloxacin 250 mg daily. 8. Metoprolol 25 mg daily. 9. Tramadol as needed. 10.Zolpidem 10 mg as needed.  ALLERGIES:  Include: 1. MINOCIN. 2. PENICILLIN. 3. ROZEREM. 4. LATEX. For additional details of the history and physical, please see the attached office note.  HOSPITAL COURSE:  On the day of admission, he was taken to operating room where he underwent transurethral resection of the prostate.  The procedure was successful without excessive bleeding.  He was left with a Foley catheter postoperatively.  On the  afternoon of surgery, he did have a small clot that required irrigation and rhythm issue and Cardiology was consulted.  His EKG revealed sinus brady with a right bundle branch block.  Later the evening, the catheter was irrigated again with removal of clots.  He was given a CPAP machine.  On the first postoperative day, he was doing well, but continued to require some brisk irrigation.  His CBG was 162.  It was elected to keep him in the hospital off CBI for another day to ensure that he did not have additional issues of bleeding.  Because of his low rate, his metoprolol was held.  He was given Uribel as needed.  He had been on vancomycin as part of his preoperative antibiotics, but that was stopped on the first postop day.  On the second postoperative day, he continued to improve, his Foley was removed, he was able to void without difficulty, and was felt to be ready for discharge home.  DISCHARGE MEDICATIONS:  Include Levaquin, Valium, Nu-Iron, Colace, multivitamin, Ambien, metformin, Lopressor, losartan, Lasix and allopurinol.  His Hytrin and Avodart were stopped.  He was given prescriptions for Vicodin and Pyridium.  INSTRUCTIONS:  He was instructed to follow up with me on October 15 at 01:15.  His activity restrictions were reviewed.  There were no complications during his  admission.  FINAL DIAGNOSIS:  Benign prostatic hypertrophy with bladder outlet obstruction.  Complications during this admission included cardiac arrhythmia and some postoperative clot, obstruction.     Excell Seltzer. Annabell Howells, M.D.     JJW/MEDQ  D:  10/22/2011  T:  10/22/2011  Job:  [id number]  cc:   Cassell Clement, M.D. Fax: [phone]  Electronically Signed by Bjorn Pippin M.D. on 10/23/2011 10:59:54 AM

## 2011-11-05 ENCOUNTER — Other Ambulatory Visit: Payer: Self-pay | Admitting: Cardiology

## 2011-11-05 DIAGNOSIS — I119 Hypertensive heart disease without heart failure: Secondary | ICD-10-CM

## 2011-11-05 MED ORDER — LOSARTAN POTASSIUM 100 MG PO TABS: 100.0000 mg | ORAL_TABLET | Freq: Every day | ORAL | Status: DC

## 2011-11-20 ENCOUNTER — Encounter: Payer: Self-pay | Admitting: Cardiology

## 2011-11-26 ENCOUNTER — Ambulatory Visit: Payer: Medicare Other | Admitting: Cardiology

## 2011-12-03 ENCOUNTER — Ambulatory Visit (INDEPENDENT_AMBULATORY_CARE_PROVIDER_SITE_OTHER): Payer: Medicare Other | Admitting: Cardiology

## 2011-12-03 ENCOUNTER — Encounter: Payer: Self-pay | Admitting: Cardiology

## 2011-12-03 VITALS — BP 144/62 | HR 48 | Ht 71.0 in | Wt 236.0 lb

## 2011-12-03 DIAGNOSIS — Z952 Presence of prosthetic heart valve: Secondary | ICD-10-CM

## 2011-12-03 DIAGNOSIS — I4891 Unspecified atrial fibrillation: Secondary | ICD-10-CM

## 2011-12-03 DIAGNOSIS — Z9889 Other specified postprocedural states: Secondary | ICD-10-CM

## 2011-12-03 DIAGNOSIS — I359 Nonrheumatic aortic valve disorder, unspecified: Secondary | ICD-10-CM

## 2011-12-03 DIAGNOSIS — I119 Hypertensive heart disease without heart failure: Secondary | ICD-10-CM

## 2011-12-03 DIAGNOSIS — Z951 Presence of aortocoronary bypass graft: Secondary | ICD-10-CM

## 2011-12-03 MED ORDER — AMLODIPINE BESYLATE 5 MG PO TABS: 5.0000 mg | ORAL_TABLET | Freq: Every day | ORAL | Status: DC

## 2011-12-03 NOTE — Patient Instructions (Addendum)
Start Amlodipine 5 mg daily-will call if any problems Your physician recommends that you schedule a follow-up appointment in: 2 months - 02/06/12 at 10:45

## 2011-12-03 NOTE — Assessment & Plan Note (Signed)
The patient has not been having symptoms of congestive heart failure.  He remains on low-dose furosemide 20 mg daily.  He has not been having any recent peripheral edema problems.  His exercise is limited now not by his dyspnea but by his painful knees.

## 2011-12-03 NOTE — Assessment & Plan Note (Signed)
The patient's blood pressure has been running high on the systolic.  This may be related to his significant weight gain although he does not appear to be fluid overloaded.  The weight gain appears to be flesh rather than fluid.  We are going to add amlodipine 5 mg one daily for better control of his systolic blood pressure.

## 2011-12-03 NOTE — Assessment & Plan Note (Signed)
The patient has not had any recurrence of his atrial fibrillation.  He is off Coumadin and on daily aspirin

## 2011-12-03 NOTE — Progress Notes (Signed)
[name] Date of Birth:  Feb 14, 1941 [facility] / [facility] [address]   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  He has a complex past medical history.  He has ischemic heart disease and underwent coronary artery bypass graft surgery on 09/07/05.  He socially developed worsening aortic stenosis and underwent aortic valve replacement with a tissue valve in June 2012.  Dr. Laneta Simmers with his surgeon for both procedures.  The patient has a history of postoperative atrial fibrillation and had to be on Coumadin initially postoperatively.  In August he underwent successful cardioversion and has remained in normal sinus rhythm and he is now off Coumadin.  He is on aspirin 81 mg daily.  He has a history of renal insufficiency and is followed by nephrology.  At the present time he is on high dose losartan 100 mg twice a day and is being followed closely for this by Dr.  Eulogio Ditch.  Her last saw the patient in September 2012.  Since then unfortunately he has gained significant weight.  His weight is up 37 pounds since September.  Patient states that his eating has been out of control and particularly his eating at bedtime seems to be his most pressing problem.  He has not been able to do much walking exercise because of painful knees.  [facility]  Current Outpatient Prescriptions  Medication Sig Dispense Refill  . allopurinol (ZYLOPRIM) 100 MG tablet Take 1 tablet (100 mg total) by mouth daily.  90 tablet  3  . aspirin 81 MG tablet Take 81 mg by mouth daily.        . diazepam (VALIUM) 5 MG tablet Take 5 mg by mouth every 6 (six) hours as needed. Taking as needed       . dutasteride (AVODART) 0.5 MG capsule Take 0.5 mg by mouth daily.        . furosemide (LASIX) 40  MG tablet Take 40 mg by mouth daily.       [name] GLUCOSAMINE-CHONDROITIN PO Take by mouth 2 (two) times daily.        . isosorbide mononitrate (IMDUR) 120 MG 24 hr tablet Take 120 mg by mouth daily.        [name] losartan (COZAAR) 100 MG tablet Take 1 tablet (100 mg total) by mouth daily.  90 tablet  1  . metoprolol tartrate (LOPRESSOR) 25 MG tablet Take 1 tablet (25 mg total) by mouth 2 (two) times daily.  180 tablet  3  . Multiple Vitamin (MULTIVITAMIN) capsule Take 1 capsule by mouth daily.        [name] terazosin (HYTRIN) 10 MG capsule Take 1 capsule (10 mg total) by mouth daily. Once a day  90 capsule  3  . traMADol (ULTRAM) 50 MG tablet Take 50 mg by mouth every 6 (six) hours as needed.        . zolpidem (AMBIEN) 10 MG tablet Take 1 tablet (10 mg total) by mouth at bedtime as needed.  30 tablet  5  . amLODipine (NORVASC) 5 MG tablet Take 1 tablet (5 mg total) by mouth daily.  90 tablet  3  . warfarin (COUMADIN) 2.5 MG tablet Take 2.5 mg by mouth daily. Taking daily in the  pm         Allergies  Allergen Reactions  . Actos (Pioglitazone Hydrochloride)   . Altace   . Amiodarone   . Indocin   . Latex   . Lotensin     ?  . Norvasc (Amlodipine Besylate)   . Orudis (Ketoprofen)   . Penicillins   . Pioglitazone     REACTION: causes CHF and edema  . Talwin   . Zoloft     Patient Active Problem List  Diagnoses  . Sarcoidosis  . OBSTRUCTIVE SLEEP APNEA  . MYOCARDIAL INFARCTION  . CORONARY HEART DISEASE  . AORTIC STENOSIS  . VALVULAR HEART DISEASE  . DYSPNEA  . Hx of CABG  . Diabetes mellitus  . Gout  . BPH (benign prostatic hyperplasia)  . Benign hypertensive heart disease without heart failure  . Chronic kidney disease (CKD), stage III (moderate)  . Atrial fibrillation with controlled ventricular response  . Severe tricuspid regurgitation by prior echocardiogram  . S/P AVR (aortic valve replacement), Tricuspid Valve repair    History  Smoking status  . Former Smoker  . Types:  Cigarettes, Pipe  . Quit date: 12/25/1995  Smokeless tobacco  . Not on file    History  Alcohol Use No    Family History  Problem Relation Age of Onset  . Heart attack Father   . Allergies    . Asthma    . Heart disease    . Cancer      Review of Systems: Constitutional: no fever chills diaphoresis or fatigue or change in weight.  Head and neck: no hearing loss, no epistaxis, no photophobia or visual disturbance. Respiratory: No cough, shortness of breath or wheezing. Cardiovascular: No chest pain peripheral edema, palpitations. Gastrointestinal: No abdominal distention, no abdominal pain, no change in bowel habits hematochezia or melena. Genitourinary: No dysuria, no frequency, no urgency, no nocturia. Musculoskeletal:No arthralgias, no back pain, no gait disturbance or myalgias. Neurological: No dizziness, no headaches, no numbness, no seizures, no syncope, no weakness, no tremors. Hematologic: No lymphadenopathy, no easy bruising. Psychiatric: No confusion, no hallucinations, no sleep disturbance.    Physical Exam: Filed Vitals:   12/03/11 1042  BP: 144/62  Pulse: 48   the general appearance reveals an overweight gentleman in no acute distress.Pupils equal and reactive.   Extraocular Movements are full.  There is no scleral icterus.  The mouth and pharynx are normal.  The neck is supple.  The carotids reveal no bruits.  The jugular venous pressure is normal.  The thyroid is not enlarged.  There is no lymphadenopathy.  The chest is clear to percussion and auscultation. There are no rales or rhonchi. Expansion of the chest is symmetrical.  Heart reveals a grade 2/6 systolic ejection murmur across the prosthetic aortic valve.  No diastolic murmur.  No gallop or rub.  The abdomen is obese no organomegaly or tenderness.  Extremities reveal no phlebitis or edema.Strength is normal and symmetrical in all extremities.  There is no lateralizing weakness.  There are no sensory  deficits.  The skin is warm and dry.  There is no rash.    Assessment / Plan: Continue same medication.  Must lose weight.  We are adding amlodipine for systolic blood pressure control.  [facility] the patient is to return in 2 months for followup office visit and after that we can consider an echocardiogram

## 2011-12-12 ENCOUNTER — Other Ambulatory Visit: Payer: Self-pay | Admitting: *Deleted

## 2011-12-12 DIAGNOSIS — G4700 Insomnia, unspecified: Secondary | ICD-10-CM

## 2011-12-12 NOTE — Telephone Encounter (Signed)
Refilled meds per fax request.  

## 2011-12-13 MED ORDER — ZOLPIDEM TARTRATE 10 MG PO TABS: 10.0000 mg | ORAL_TABLET | Freq: Every evening | ORAL | Status: DC | PRN

## 2011-12-14 ENCOUNTER — Telehealth: Payer: Self-pay | Admitting: Cardiology

## 2011-12-14 NOTE — Telephone Encounter (Signed)
New Problem:    Patient called because his Losartin should be increased to twice a day because his Nephrologists Dr. Laddie Aquas changed it and the pharmacy won't refill it until it gets changed on our end. Please call back because he is down to seven pills.

## 2011-12-14 NOTE — Telephone Encounter (Signed)
Left message that he needed to contact nephrologist since that MD changed dose per  Dr. Patty Sermons

## 2011-12-17 ENCOUNTER — Encounter: Payer: Medicare Other | Admitting: Cardiology

## 2012-01-03 ENCOUNTER — Encounter (INDEPENDENT_AMBULATORY_CARE_PROVIDER_SITE_OTHER): Payer: Medicare Other | Admitting: Cardiology

## 2012-01-03 ENCOUNTER — Other Ambulatory Visit: Payer: Self-pay | Admitting: Cardiology

## 2012-01-29 ENCOUNTER — Telehealth: Payer: Self-pay | Admitting: Cardiology

## 2012-01-29 MED ORDER — AZITHROMYCIN 250 MG PO TABS: ORAL_TABLET | ORAL | Status: DC

## 2012-01-29 NOTE — Telephone Encounter (Signed)
New Msg: Pt dentist calling wanting to speak with nurse/MD regarding pre-med for pt dentistry work. Please return call to discuss further.   FAX # [phone]

## 2012-01-29 NOTE — Telephone Encounter (Signed)
The patient needs SBE prophylaxis for his prosthetic aortic valve.  Since he is allergic to penicillins, we should use .   Zithromax 500 mg by mouth 30-60 minutes prior to dental work.  Clindamycin 600 mg could also be used in place of Zithromax if preferred.

## 2012-01-29 NOTE — Telephone Encounter (Signed)
Advised patient and he prefers Zithromax. Sent to pharmacy and will call dentist tomorrow

## 2012-01-30 NOTE — Telephone Encounter (Signed)
Called dentist office and left message

## 2012-02-06 ENCOUNTER — Ambulatory Visit (INDEPENDENT_AMBULATORY_CARE_PROVIDER_SITE_OTHER): Payer: Medicare Other | Admitting: Cardiology

## 2012-02-06 ENCOUNTER — Encounter: Payer: Self-pay | Admitting: Cardiology

## 2012-02-06 VITALS — BP 130/64 | Ht 71.0 in | Wt 242.0 lb

## 2012-02-06 DIAGNOSIS — Z952 Presence of prosthetic heart valve: Secondary | ICD-10-CM

## 2012-02-06 DIAGNOSIS — I359 Nonrheumatic aortic valve disorder, unspecified: Secondary | ICD-10-CM

## 2012-02-06 DIAGNOSIS — D869 Sarcoidosis, unspecified: Secondary | ICD-10-CM

## 2012-02-06 DIAGNOSIS — E119 Type 2 diabetes mellitus without complications: Secondary | ICD-10-CM

## 2012-02-06 DIAGNOSIS — I4891 Unspecified atrial fibrillation: Secondary | ICD-10-CM

## 2012-02-06 DIAGNOSIS — Z951 Presence of aortocoronary bypass graft: Secondary | ICD-10-CM

## 2012-02-06 IMAGING — CR DG CHEST 1V PORT
1 series · 1 of 1 positions shown · non-contrast
Comparison: 06/11/2011 and earlier.

CLINICAL DATA: 69-year-old male aortic stenosis.

PORTABLE CHEST - 1 VIEW

[view not recorded]
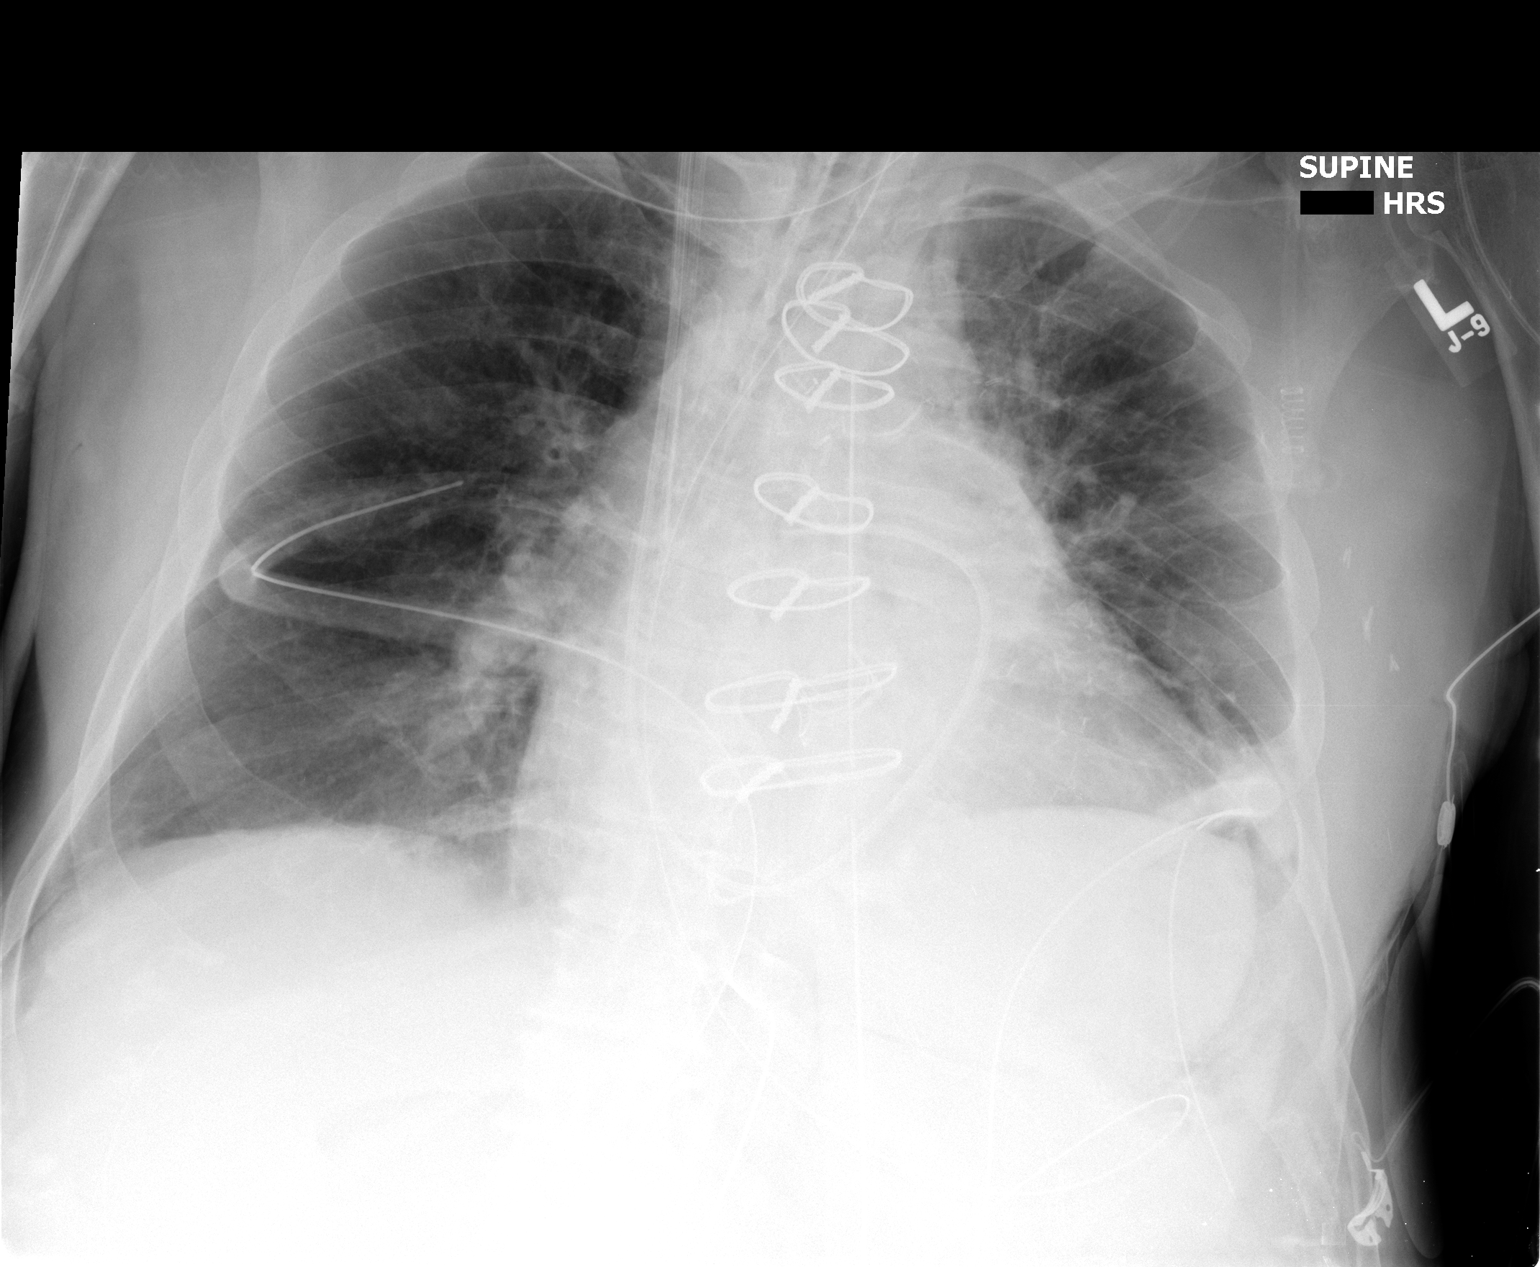

[1 of 1 positions shown; findings below may reference images not displayed]

FINDINGS: AP portable supine view 7838 hours.

Endotracheal tube tip between the level of the clavicles and
carina.  Enteric tube courses to the abdomen, tip not included.
Bilateral chest tubes.  2 mediastinal drains.  Right IJ approach
Swan-Ganz catheter, tip at the level of the right main pulmonary
artery.

Lower lung volumes.  Interval improved ventilation at the right
lung base.  No pneumothorax identified.  No pulmonary edema or
confluent pulmonary opacity.  Sequelae of aortic valve replacement
now seen.
IMPRESSION: 1.  Lines and tubes appear appropriately placed as detailed above.
2.  Interval improved ventilation at the right lung base.  No
pneumothorax or pulmonary edema.

## 2012-02-06 NOTE — Patient Instructions (Signed)
Your physician recommends that you continue on your current medications as directed. Please refer to the Current Medication list given to you today.  Your physician recommends that you schedule a follow-up appointment in: 3 months ov/ekg 

## 2012-02-06 NOTE — Assessment & Plan Note (Signed)
The patient has not been having any symptoms from his sarcoidosis which presently is quiescent.

## 2012-02-06 NOTE — Assessment & Plan Note (Signed)
The patient has not had a recurrence of angina pectoris.  The patient is no longer having to be on furosemide.

## 2012-02-06 NOTE — Assessment & Plan Note (Signed)
The patient has had no recurrence of his atrial fibrillation.  He has not been having any palpitations or tachycardia

## 2012-02-06 NOTE — Progress Notes (Signed)
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  He has a history of coronary artery disease and underwent coronary artery bypass graft surgery in 2006.  Because of worsening aortic stenosis he underwent aortic valve replacement with a tissue valve in June 2012.  In August 2012 he underwent successful cardioversion.  He is no longer on Coumadin.  He takes an aspirin daily.  He has a history of renal insufficiency and is followed by nephrology.  As a history of arthritis and is followed by Dr. Kellie Simmering.  He has a history of renal insufficiency and is followed by Dr. Arrie Aran.  Since last visit he has been feeling well with no new cardiac complaints  Current Outpatient Prescriptions  Medication Sig Dispense Refill  . allopurinol (ZYLOPRIM) 100 MG tablet Take 1 tablet (100 mg total) by mouth daily.  90 tablet  3  . amLODipine (NORVASC) 5 MG tablet Take 1 tablet (5 mg total) by mouth daily.  90 tablet  3  . aspirin 81 MG tablet Take 81 mg by mouth daily.        [name] azithromycin (ZITHROMAX) 250 MG tablet 2tablets 30-60 minutes prior to procedure  6 each  0  . diazepam (VALIUM) 5 MG tablet Take 10 mg by mouth every 6 (six) hours as needed. Taking as needed      . GLUCOSAMINE-CHONDROITIN PO Take by mouth 2 (two) times daily. Taking 2 bid      . isosorbide mononitrate (IMDUR) 120 MG 24 hr tablet Take 120 mg by mouth daily.        [name] losartan (COZAAR) 100 MG tablet Take 100 mg by mouth 2 (two) times daily.      . Magnesium 400 MG CAPS Take by mouth daily.      . metFORMIN (GLUCOPHAGE) 500 MG tablet Take 500 mg by mouth 2 (two) times daily with a meal.      . metoprolol tartrate (LOPRESSOR) 25 MG tablet Take 1 tablet (25 mg total) by mouth 2 (two) times daily.  180 tablet  3  .  Multiple Vitamin (MULTIVITAMIN) capsule Take 1 capsule by mouth daily.        [name] terazosin (HYTRIN) 10 MG capsule Take 1 capsule (10 mg total) by mouth daily. Once a day  90 capsule  3  . TESTOSTERONE IM Inject into the muscle. As directed      . traMADol (ULTRAM) 50 MG tablet Take 50 mg by mouth every 6 (six) hours as needed.        . zolpidem (AMBIEN) 10 MG tablet Take 1 tablet (10 mg total) by mouth at bedtime as needed.  30 tablet  5  . DISCONTD: losartan (COZAAR) 100 MG tablet Take 1 tablet (100 mg total) by mouth daily.  90 tablet  1    Allergies  Allergen Reactions  . Actos (Pioglitazone Hydrochloride)   . Altace   . Amiodarone   . Indocin   . Latex   . Lotensin     ?  . Norvasc (Amlodipine Besylate)   . Orudis (Ketoprofen)   . Penicillins   . Pioglitazone     REACTION: causes CHF and edema  . Talwin   . Zoloft     Patient Active  Problem List  Diagnoses  . Sarcoidosis  . OBSTRUCTIVE SLEEP APNEA  . MYOCARDIAL INFARCTION  . CORONARY HEART DISEASE  . AORTIC STENOSIS  . VALVULAR HEART DISEASE  . DYSPNEA  . Hx of CABG  . Diabetes mellitus  . Gout  . BPH (benign prostatic hyperplasia)  . Benign hypertensive heart disease without heart failure  . Chronic kidney disease (CKD), stage III (moderate)  . Atrial fibrillation with controlled ventricular response  . Severe tricuspid regurgitation by prior echocardiogram  . S/P AVR (aortic valve replacement), Tricuspid Valve repair    History  Smoking status  . Former Smoker  . Types: Cigarettes, Pipe  . Quit date: 12/25/1995  Smokeless tobacco  . Not on file    History  Alcohol Use No    Family History  Problem Relation Age of Onset  . Heart attack Father   . Allergies    . Asthma    . Heart disease    . Cancer      Review of Systems: Constitutional: no fever chills diaphoresis or fatigue or change in weight.  Head and neck: no hearing loss, no epistaxis, no photophobia or visual disturbance. Respiratory:  No cough, shortness of breath or wheezing. Cardiovascular: No chest pain peripheral edema, palpitations. Gastrointestinal: No abdominal distention, no abdominal pain, no change in bowel habits hematochezia or melena. Genitourinary: No dysuria, no frequency, no urgency, no nocturia. Musculoskeletal:No arthralgias, no back pain, no gait disturbance or myalgias. Neurological: No dizziness, no headaches, no numbness, no seizures, no syncope, no weakness, no tremors. Hematologic: No lymphadenopathy, no easy bruising. Psychiatric: No confusion, no hallucinations, no sleep disturbance.    Physical Exam: Filed Vitals:   02/06/12 1103  BP: 130/64   The general appearance reveals a large gentleman in no distress.Pupils equal and reactive.   Extraocular Movements are full.  There is no scleral icterus.  The mouth and pharynx are normal.  The neck is supple.  The carotids reveal no bruits.  The jugular venous pressure is normal.  The thyroid is not enlarged.  There is no lymphadenopathy.  The chest is clear to percussion and auscultation. There are no rales or rhonchi. Expansion of the chest is symmetrical.  The heart reveals a grade 2/6 systolic ejection murmur at the base.  Rhythm is regular.  We did not do an EKG today.The abdomen is soft and nontender. Bowel sounds are normal. The liver and spleen are not enlarged. There Are no abdominal masses. There are no bruits.  The pedal pulses are good.  There is no phlebitis or edema.  There is no cyanosis or clubbing. Strength is normal and symmetrical in all extremities.  There is no lateralizing weakness.  There are no sensory deficits.  The skin is warm and dry.  There is no rash.   Assessment / Plan: Continue same medication.  Continue regular water aerobics 3 times a week to help lose weight.  Check in 3 months for followup office visit and EKG

## 2012-02-06 NOTE — Assessment & Plan Note (Signed)
The patient had to go on a recent steroid Dosepak from his orthopedist because of back pain.  Along with steroids to his diabetes control was erratic.  Now that he is off steroids his blood sugars have improved

## 2012-02-07 IMAGING — CR DG CHEST 1V PORT
1 series · 1 of 1 positions shown · non-contrast
Comparison: 06/13/2011

CLINICAL DATA: Post CABG

PORTABLE CHEST - 1 VIEW

[view not recorded]
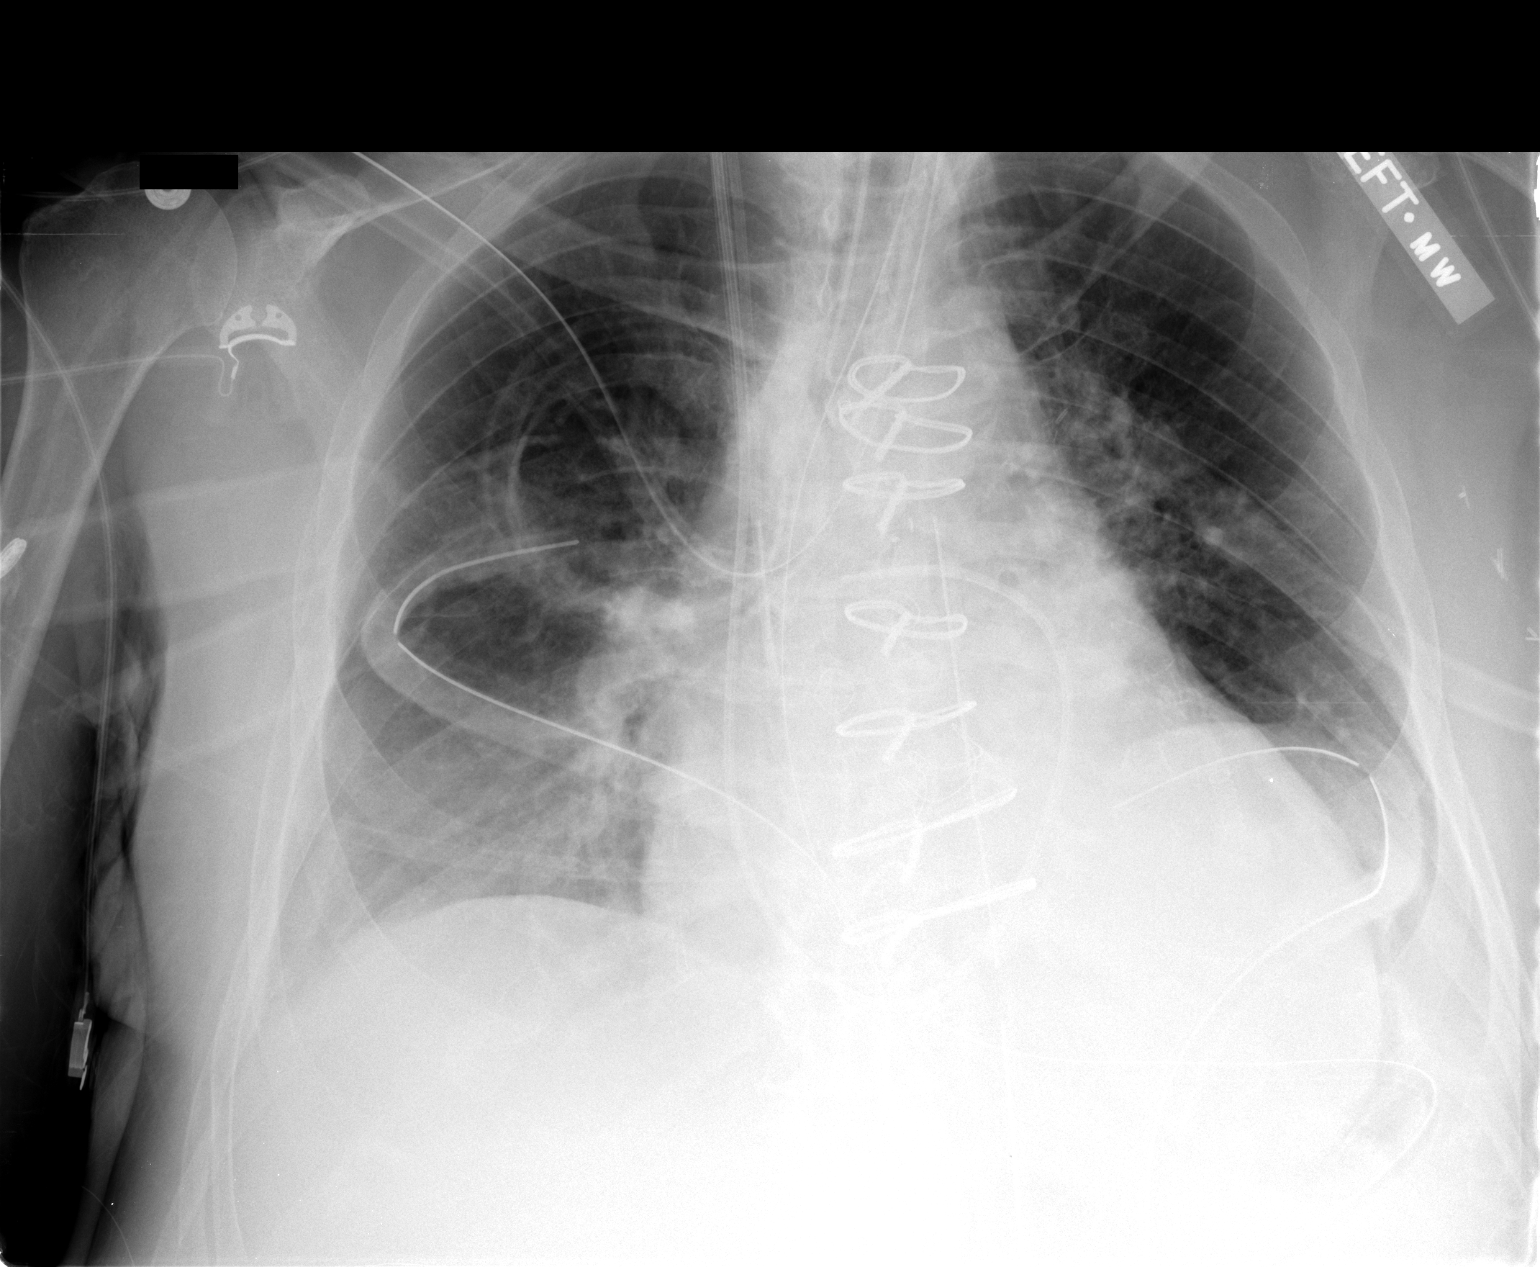

[1 of 1 positions shown; findings below may reference images not displayed]

FINDINGS: Tip of endotracheal tube terminates 4.5 cm above carina.
Enteric tube enters area of stomach.  Tip is not included on image.
Tip of right internal jugular Swan-Ganz catheter tip terminates in
right pulmonary artery. Tip of venous catheter on the right
terminates in superior vena cava.  Two mediastinal drains are in
place.  Bilateral chest tubes remain in place.  Stable moderate
enlargement of the cardiac silhouette.  No pneumothorax.
Minimal basilar atelectasis.  No consolidation evident.
IMPRESSION: Support apparatus as described above.
Stable moderate enlargement of the cardiac silhouette.  No
pneumothorax.
Minimal basilar atelectasis.  No consolidation evident.

## 2012-02-08 IMAGING — US US RENAL PORT
1 series · 14 of 25 positions shown · non-contrast
Comparison: Head CT 04/04/2011.  CT abdomen 01/19/2011.

CLINICAL DATA: 69-year-old male with acute renal failure.
Sarcoidosis.

RENAL/URINARY TRACT ULTRASOUND COMPLETE

[Series 1: us renal port · 0.31mm/px · 14 of 41 slices shown]
[im 1/41]
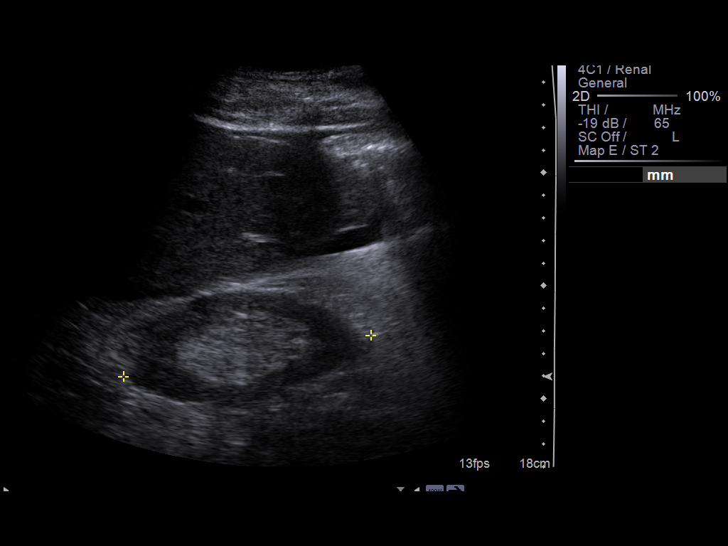
[im 4/41]
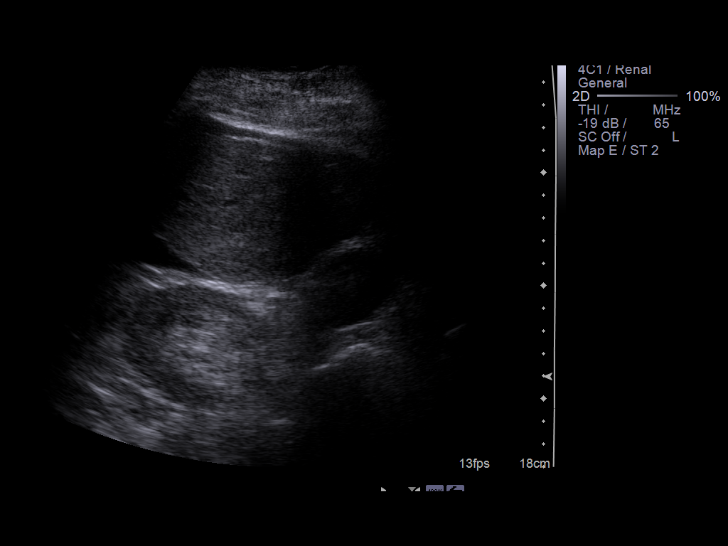
[im 7/41]
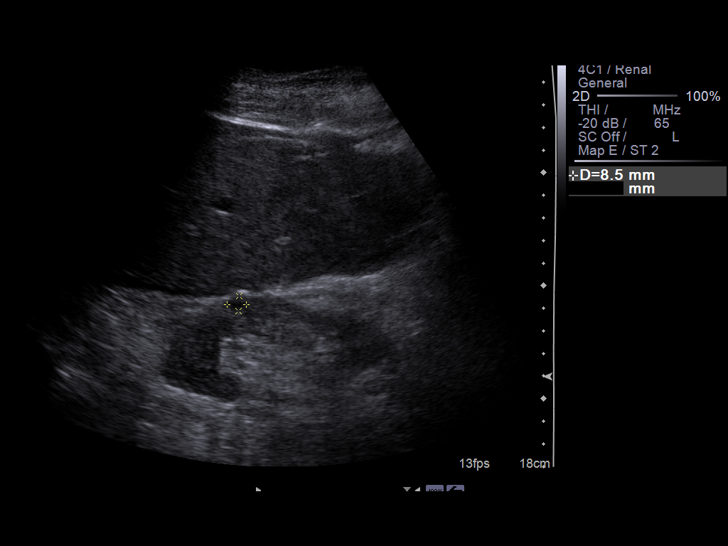
[im 11/41]
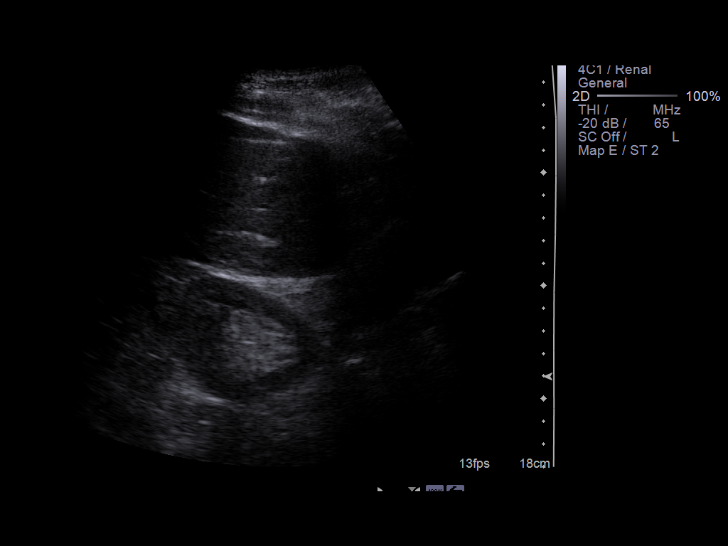
[im 14/41]
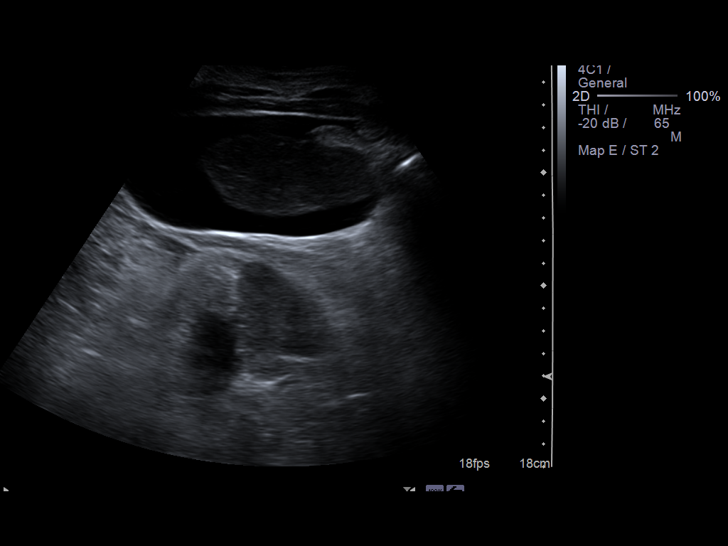
[im 16/41]
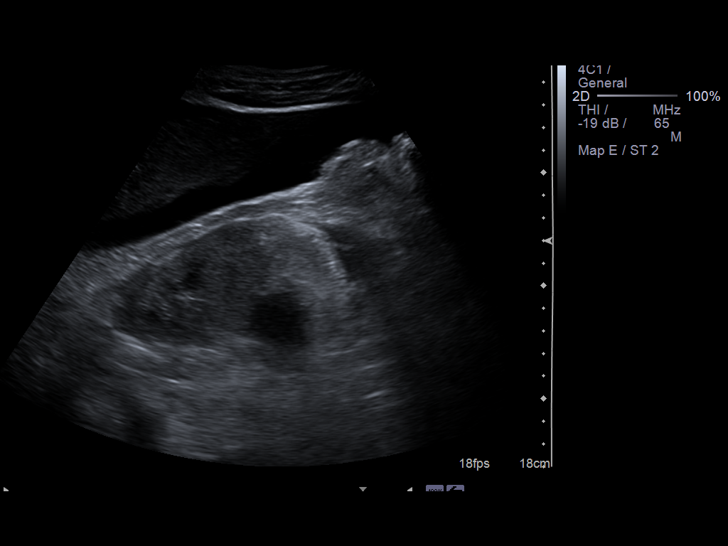
[im 19/41]
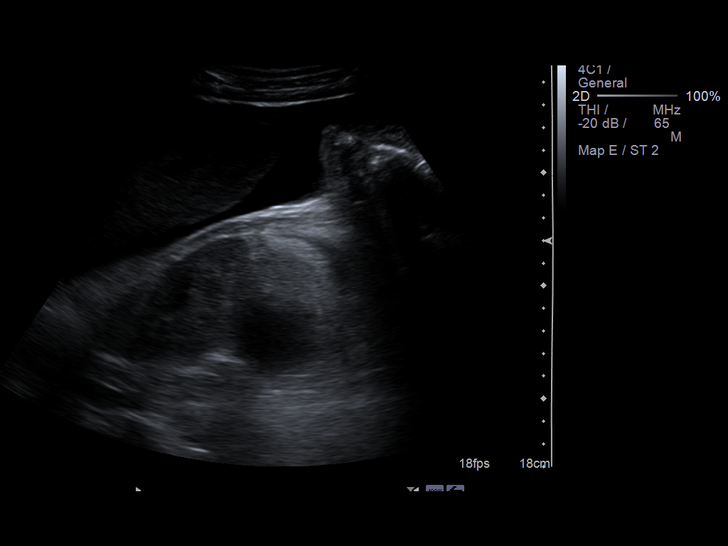
[im 22/41]
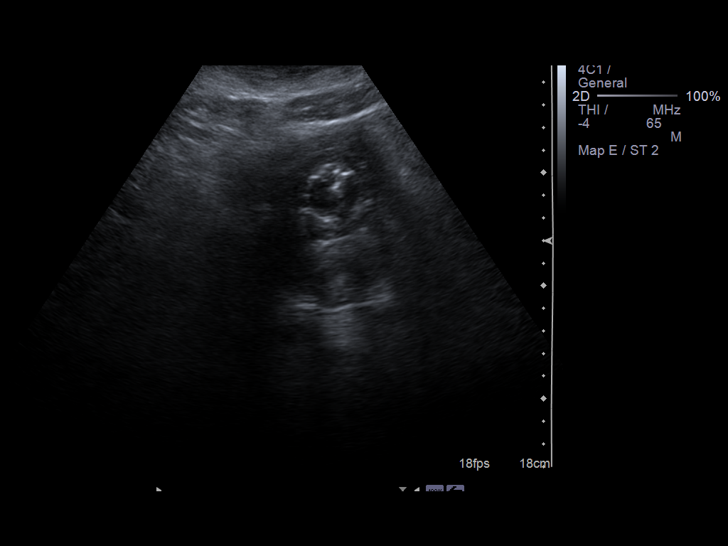
[im 26/41]
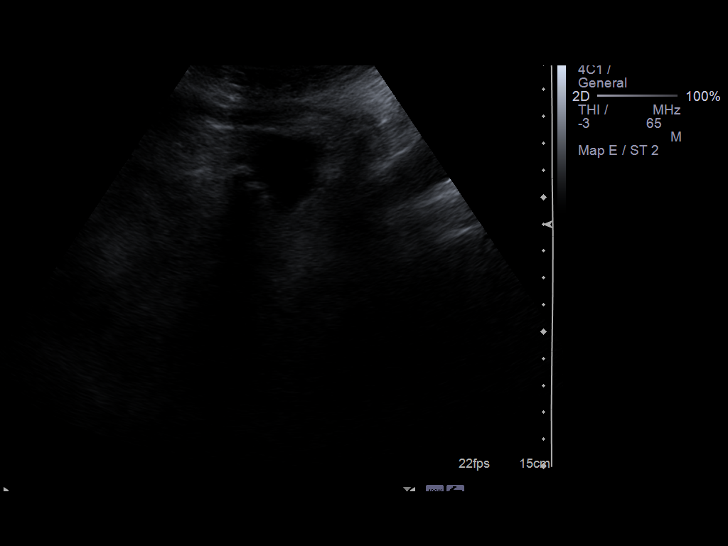
[im 27/41]
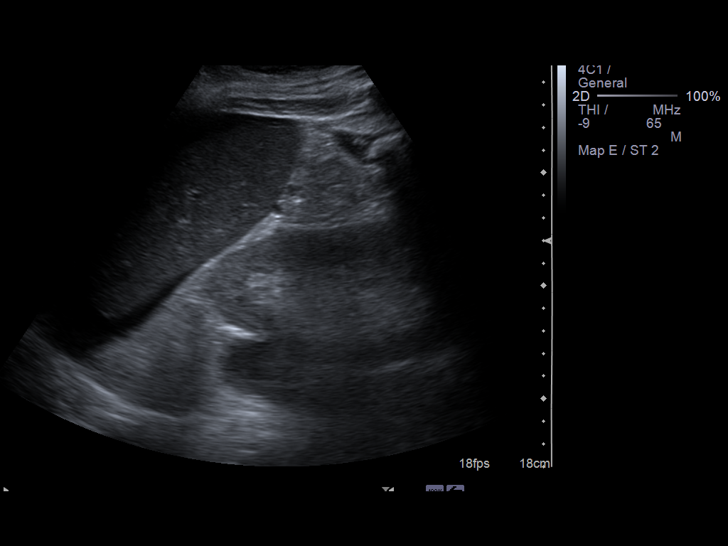
[im 31/41]
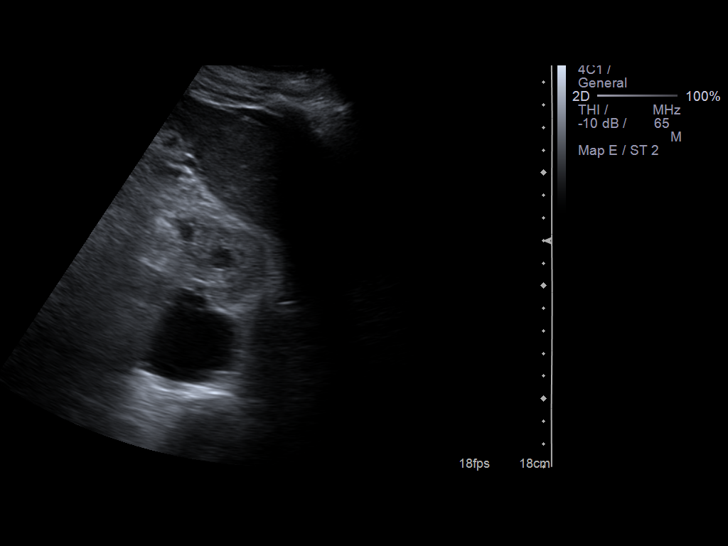
[im 34/41]
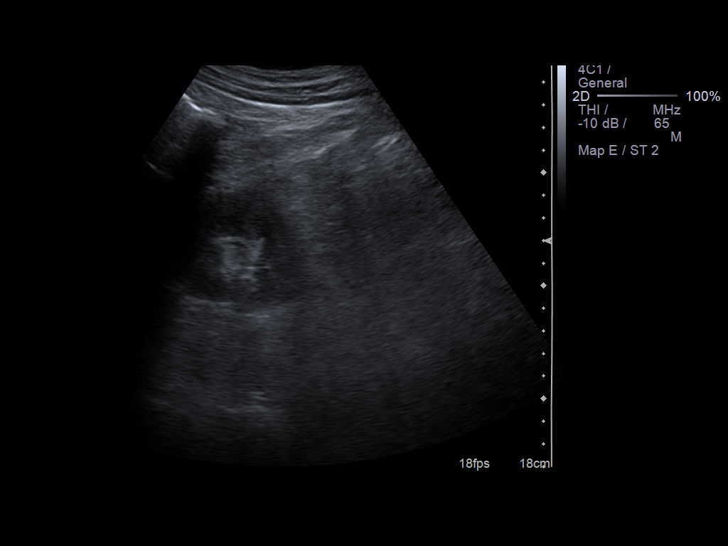
[im 37/41]
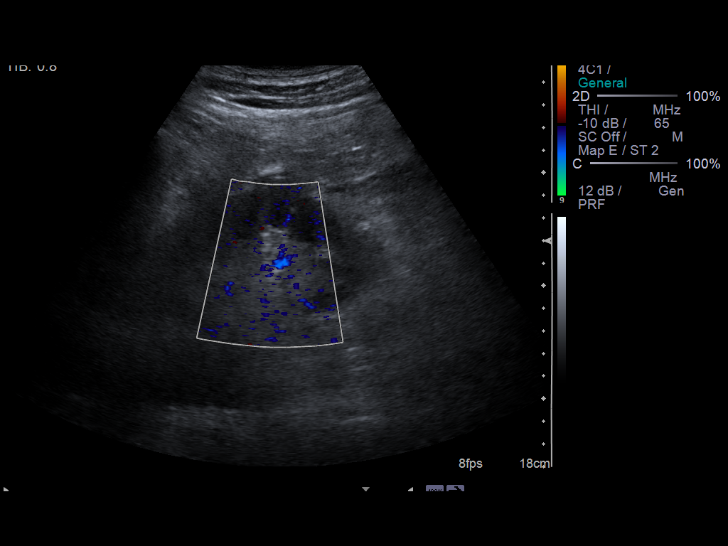
[im 41/41]
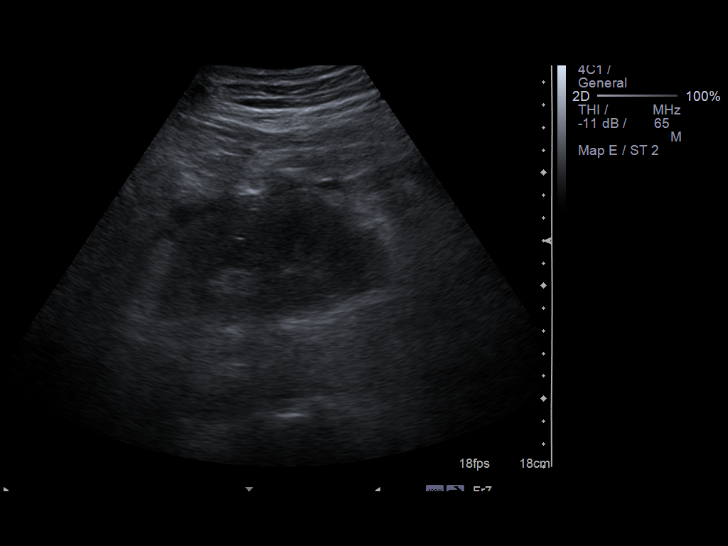

[14 of 25 positions shown; findings below may reference images not displayed]

FINDINGS: Right Kidney:  No hydronephrosis.  Renal length 12.4 cm.  Stable
lower pole cyst measuring up to 5 cm in diameter.  Cortical
echotexture within normal limits.

Left Kidney:  No hydronephrosis.  Renal length 12.5 cm.  Stable
medial upper pole cyst measuring 4 cm in diameter.  Cortical
echotexture within normal limits.

Bladder:  Decompressed by Foley catheter.

Other findings:  Ascites.  Right pleural effusion.
IMPRESSION: 1.  No acute renal findings.
2.  Ascites.  Right pleural effusion.

## 2012-02-11 IMAGING — CR DG CHEST 1V PORT
1 series · 1 of 1 positions shown · non-contrast
Comparison: 06/17/2011

CLINICAL DATA: Recurrent pleural effusion

PORTABLE CHEST - 1 VIEW

[view not recorded]
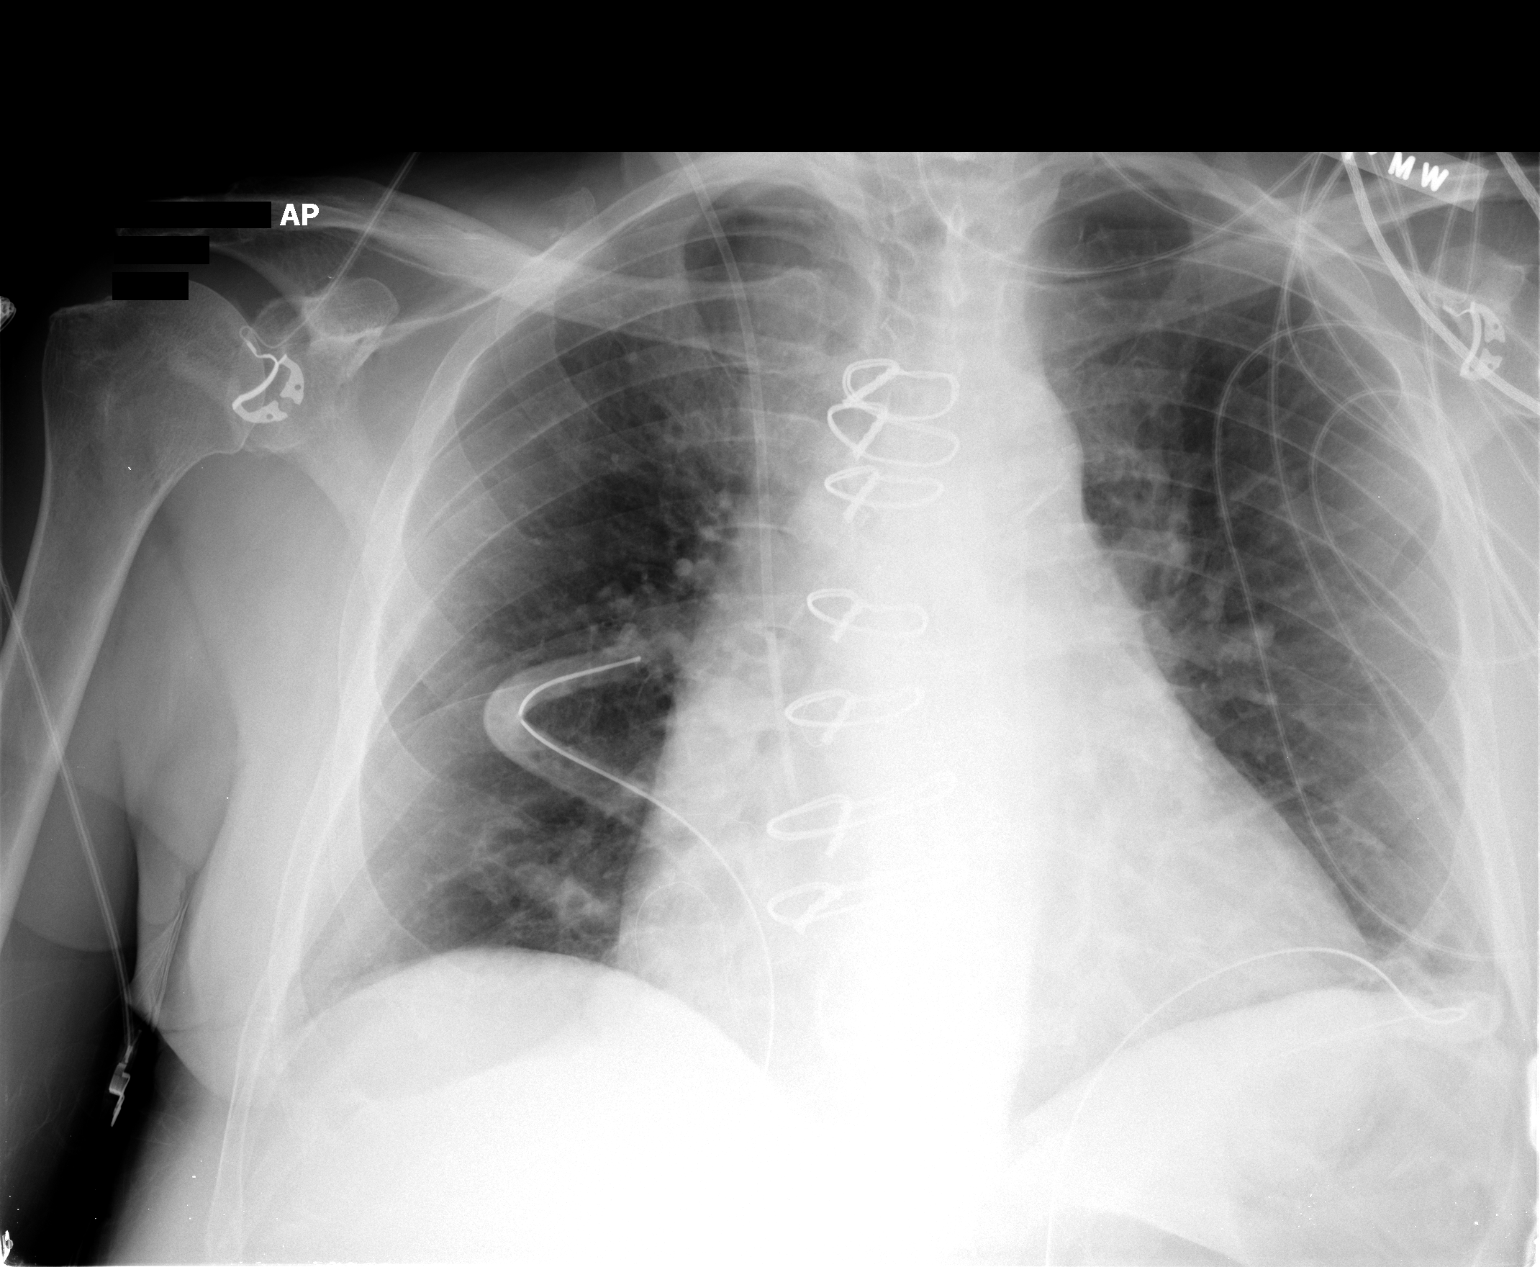

[1 of 1 positions shown; findings below may reference images not displayed]

FINDINGS: Continued improvement in aeration of the lung bases.
Minimal bibasilar residual density is noted.  Bilateral chest tubes
remain in place.  No pleural fluid evident in one-view.
IMPRESSION: 1.  Continued improvement in bibasilar aeration.
2.  No significant pleural effusion in one-view.

## 2012-03-06 ENCOUNTER — Other Ambulatory Visit: Payer: Self-pay | Admitting: Orthopedic Surgery

## 2012-03-06 DIAGNOSIS — M5416 Radiculopathy, lumbar region: Secondary | ICD-10-CM

## 2012-03-10 ENCOUNTER — Ambulatory Visit
Admission: RE | Admit: 2012-03-10 | Discharge: 2012-03-10 | Disposition: A | Discharge status: Home / Self Care | Payer: Medicare Other | Source: Ambulatory Visit | Attending: Orthopedic Surgery | Admitting: Orthopedic Surgery

## 2012-03-10 DIAGNOSIS — M5416 Radiculopathy, lumbar region: Secondary | ICD-10-CM

## 2012-03-12 ENCOUNTER — Telehealth: Payer: Self-pay | Admitting: Cardiology

## 2012-03-12 ENCOUNTER — Other Ambulatory Visit: Payer: Self-pay | Admitting: Orthopedic Surgery

## 2012-03-12 DIAGNOSIS — M7138 Other bursal cyst, other site: Secondary | ICD-10-CM

## 2012-03-12 NOTE — Telephone Encounter (Signed)
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  1) Does he need SBE?  2) used Zithromax last week for SBE  and then information came out and doesn't know if use this anymore anyway.  3 ) how long should he be off ASA as well prior  Will forward to  Dr. Patty Sermons for review

## 2012-03-12 NOTE — Telephone Encounter (Signed)
New Msg: Pt calling wanting to speak with nurse/MD. Please return call to discuss further.

## 2012-03-12 NOTE — Telephone Encounter (Signed)
He does not need SBE prophyllaxis since this is a "clean" procedure.   He can still use Zithromax since he does not have long QT problems.  Leave off ASA 5 days before

## 2012-03-12 NOTE — Telephone Encounter (Signed)
Advised patient

## 2012-03-13 ENCOUNTER — Ambulatory Visit
Admission: RE | Admit: 2012-03-13 | Discharge: 2012-03-13 | Disposition: A | Discharge status: Home / Self Care | Payer: Medicare Other | Source: Ambulatory Visit | Attending: Orthopedic Surgery | Admitting: Orthopedic Surgery

## 2012-03-13 DIAGNOSIS — M7138 Other bursal cyst, other site: Secondary | ICD-10-CM

## 2012-03-13 MED ORDER — IOHEXOL 180 MG/ML  SOLN
1.0000 mL | Freq: Once | INTRAMUSCULAR | Status: AC | PRN
  Administered 2012-03-13: 1 mL via INTRA_ARTICULAR

## 2012-03-13 MED ORDER — DEXAMETHASONE SODIUM PHOSPHATE 4 MG/ML IJ SOLN
2.0000 mg | Freq: Once | INTRAMUSCULAR | Status: AC
  Administered 2012-03-13: 2 mg via INTRA_ARTICULAR

## 2012-03-13 NOTE — Discharge Instructions (Signed)

## 2012-04-23 ENCOUNTER — Other Ambulatory Visit: Payer: Self-pay | Admitting: Orthopedic Surgery

## 2012-04-23 DIAGNOSIS — M7138 Other bursal cyst, other site: Secondary | ICD-10-CM

## 2012-04-30 ENCOUNTER — Ambulatory Visit
Admission: RE | Admit: 2012-04-30 | Discharge: 2012-04-30 | Disposition: A | Discharge status: Home / Self Care | Payer: Medicare Other | Source: Ambulatory Visit | Attending: Orthopedic Surgery | Admitting: Orthopedic Surgery

## 2012-04-30 ENCOUNTER — Other Ambulatory Visit: Payer: Medicare Other

## 2012-04-30 VITALS — BP 176/114 | HR 57

## 2012-04-30 DIAGNOSIS — M7138 Other bursal cyst, other site: Secondary | ICD-10-CM

## 2012-04-30 MED ORDER — IOHEXOL 180 MG/ML  SOLN
1.0000 mL | Freq: Once | INTRAMUSCULAR | Status: AC | PRN
  Administered 2012-04-30: 1 mL via INTRA_ARTICULAR

## 2012-04-30 MED ORDER — METHYLPREDNISOLONE ACETATE 40 MG/ML INJ SUSP (RADIOLOG
120.0000 mg | Freq: Once | INTRAMUSCULAR | Status: AC
  Administered 2012-04-30: 120 mg via INTRA_ARTICULAR

## 2012-05-08 ENCOUNTER — Other Ambulatory Visit (HOSPITAL_BASED_OUTPATIENT_CLINIC_OR_DEPARTMENT_OTHER): Payer: Self-pay | Admitting: Family Medicine

## 2012-05-08 DIAGNOSIS — G459 Transient cerebral ischemic attack, unspecified: Secondary | ICD-10-CM

## 2012-05-09 ENCOUNTER — Ambulatory Visit (HOSPITAL_BASED_OUTPATIENT_CLINIC_OR_DEPARTMENT_OTHER)
Admission: RE | Admit: 2012-05-09 | Discharge: 2012-05-09 | Disposition: A | Discharge status: Home / Self Care | Payer: Medicare Other | Source: Ambulatory Visit | Attending: Family Medicine | Admitting: Family Medicine

## 2012-05-09 DIAGNOSIS — G459 Transient cerebral ischemic attack, unspecified: Secondary | ICD-10-CM

## 2012-05-09 DIAGNOSIS — I658 Occlusion and stenosis of other precerebral arteries: Secondary | ICD-10-CM | POA: Insufficient documentation

## 2012-05-09 DIAGNOSIS — I6529 Occlusion and stenosis of unspecified carotid artery: Secondary | ICD-10-CM | POA: Insufficient documentation

## 2012-05-14 ENCOUNTER — Other Ambulatory Visit: Payer: Medicare Other

## 2012-05-21 ENCOUNTER — Encounter: Payer: Self-pay | Admitting: Cardiology

## 2012-05-21 ENCOUNTER — Ambulatory Visit (INDEPENDENT_AMBULATORY_CARE_PROVIDER_SITE_OTHER): Payer: Medicare Other | Admitting: Cardiology

## 2012-05-21 VITALS — BP 164/66 | HR 57 | Ht 71.5 in | Wt 241.1 lb

## 2012-05-21 DIAGNOSIS — I359 Nonrheumatic aortic valve disorder, unspecified: Secondary | ICD-10-CM

## 2012-05-21 DIAGNOSIS — I4891 Unspecified atrial fibrillation: Secondary | ICD-10-CM

## 2012-05-21 DIAGNOSIS — Z954 Presence of other heart-valve replacement: Secondary | ICD-10-CM

## 2012-05-21 DIAGNOSIS — M109 Gout, unspecified: Secondary | ICD-10-CM

## 2012-05-21 DIAGNOSIS — Z952 Presence of prosthetic heart valve: Secondary | ICD-10-CM

## 2012-05-21 NOTE — Assessment & Plan Note (Signed)
The patient is followed by nephrology for his chronic kidney disease.  He states that his creatinine is running in the range of 2.0 at the present time

## 2012-05-21 NOTE — Assessment & Plan Note (Signed)
The patient has not been experiencing any recurrent  atrial fibrillation. 

## 2012-05-21 NOTE — Patient Instructions (Signed)
Your physician recommends that you continue on your current medications as directed. Please refer to the Current Medication list given to you today.  Your physician recommends that you schedule a follow-up appointment in: 3 month ov/ekg 

## 2012-05-21 NOTE — Assessment & Plan Note (Signed)
The patient is doing well following his aortic valve replacement in 2012.  He is not having any exertional chest pain or exertional dizziness or syncope.  He does have some exertional dyspnea related to decreased conditioning and being overweight

## 2012-05-21 NOTE — Progress Notes (Signed)
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  The patient has a history of any heart disease.  He had coronary artery bypass graft surgery in 2006.  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  Postoperatively he had atrial fibrillation and was successfully cardioverted in August of 2012.  His Coumadin was subsequent stopped and he takes just aspirin.  Patient has history of renal insufficiency and is followed by nephrology.  He has a history of arthritis and is followed by Dr. Kellie Simmering.  His primary care physician is Dr. Foy Guadalajara.  Since last visit has been feeling well except for episode of brief a Bascom Levels on May 15.  The following day he underwent CT of the brain which was normal and carotid artery and vertebral artery Dopplers were normal.  His blood pressure medicine was switched from losartan to Prinivil and since then the patient has had no further problems.  Current Outpatient Prescriptions  Medication Sig Dispense Refill  . allopurinol (ZYLOPRIM) 100 MG tablet Take 1 tablet (100 mg total) by mouth daily.  90 tablet  3  . Alpha-Lipoic Acid 600 MG CAPS Take 600 mg by mouth daily.      [name] amLODipine (NORVASC) 5 MG tablet Take 1 tablet (5 mg total) by mouth daily.  90 tablet  3  . aspirin 81 MG tablet Take 81 mg by mouth daily.        . diazepam (VALIUM) 5 MG tablet Take 10 mg by mouth every 6 (six) hours as needed. Taking as needed      . furosemide (LASIX) 40 MG tablet Take 40 mg by mouth daily.      [name] lisinopril (PRINIVIL,ZESTRIL) 20 MG tablet Take 20 mg by mouth daily.      . Magnesium 400 MG CAPS Take by mouth as needed.       . metFORMIN (GLUCOPHAGE) 500 MG tablet Take 500  mg by mouth 2 (two) times daily with a meal.      . metoprolol tartrate (LOPRESSOR) 25 MG tablet Take 1 tablet (25 mg total) by mouth 2 (two) times daily.  180 tablet  3  . terazosin (HYTRIN) 10 MG capsule Take 1 capsule (10 mg total) by mouth daily. Once a day  90 capsule  3  . TESTOSTERONE IM Inject 200 Units into the muscle. As directed      . traMADol (ULTRAM) 50 MG tablet Take 50 mg by mouth every 6 (six) hours as needed.        . zolpidem (AMBIEN) 10 MG tablet Take 1 tablet (10 mg total) by mouth at bedtime as needed.  30 tablet  5    Allergies  Allergen Reactions  . Actos (Pioglitazone Hydrochloride) Swelling  . Penicillins Anaphylaxis  . Amiodarone Other (See Comments)    diarrhea  . Latex Swelling  . Pioglitazone Other (See Comments)    REACTION: causes CHF and edema  . Norvasc (Amlodipine Besylate)   . Orudis (Ketoprofen)   . Pentazocine Lactate   . Ramipril   . Sertraline Hcl   . Benazepril Hcl     ? Possible lowers plateletes  . Indomethacin Other (See Comments)  Renal problems   . Minocycline Other (See Comments)    Makes dizzy and feel just lousy.    Patient Active Problem List  Diagnoses  . Sarcoidosis  . OBSTRUCTIVE SLEEP APNEA  . MYOCARDIAL INFARCTION  . CORONARY HEART DISEASE  . AORTIC STENOSIS  . VALVULAR HEART DISEASE  . DYSPNEA  . Hx of CABG  . Diabetes mellitus  . Gout  . BPH (benign prostatic hyperplasia)  . Benign hypertensive heart disease without heart failure  . Chronic kidney disease (CKD), stage III (moderate)  . Atrial fibrillation with controlled ventricular response  . Severe tricuspid regurgitation by prior echocardiogram  . S/P AVR (aortic valve replacement), Tricuspid Valve repair    History  Smoking status  . Former Smoker  . Types: Cigarettes, Pipe  . Quit date: 12/25/1995  Smokeless tobacco  . Not on file    History  Alcohol Use No    Family History  Problem Relation Age of Onset  . Heart attack Father   .  Allergies    . Asthma    . Heart disease    . Cancer      Review of Systems: Constitutional: no fever chills diaphoresis or fatigue or change in weight.  Head and neck: no hearing loss, no epistaxis, no photophobia or visual disturbance. Respiratory: No cough, shortness of breath or wheezing. Cardiovascular: No chest pain peripheral edema, palpitations. Gastrointestinal: No abdominal distention, no abdominal pain, no change in bowel habits hematochezia or melena. Genitourinary: No dysuria, no frequency, no urgency, no nocturia. Musculoskeletal:No arthralgias, no back pain, no gait disturbance or myalgias. Neurological: No dizziness, no headaches, no numbness, no seizures, no syncope, no weakness, no tremors. Hematologic: No lymphadenopathy, no easy bruising. Psychiatric: No confusion, no hallucinations, no sleep disturbance.    Physical Exam: Filed Vitals:   05/21/12 1426  BP: 164/66  Pulse: 57   the general appearance reveals a well-developed well-nourished gentleman who is overweight.  I rechecked his blood pressure later in the exam and it has come down to 138/70.Pupils equal and reactive.   Extraocular Movements are full.  There is no scleral icterus.  The mouth and pharynx are normal.  The neck is supple.  The carotids reveal no bruits.  The jugular venous pressure is normal.  The thyroid is not enlarged.  There is no lymphadenopathy.  The chest is clear to percussion and auscultation. There are no rales or rhonchi. Expansion of the chest is symmetrical.  Heart reveals a grade 2/6 systolic ejection murmur across the prosthetic aortic valve.  No diastolic murmur.  The abdomen is obese and nontender.  No masses palpable.  Extremities reveal no phlebitis or edema.  Neurologic exam is physiologic.  Normal integument.  EKG shows sinus bradycardia with right bundle branch block and left posterior fascicular block  Assessment / Plan: Continue same medication.  Recheck in 3 months for  followup office visit and EKG.

## 2012-05-21 NOTE — Assessment & Plan Note (Signed)
The patient has had no recurrence of his gout.  He is on allopurinol which he is tolerating well.

## 2012-05-27 ENCOUNTER — Other Ambulatory Visit: Payer: Self-pay | Admitting: *Deleted

## 2012-05-27 NOTE — Telephone Encounter (Signed)
Opened in Error.

## 2012-05-28 ENCOUNTER — Other Ambulatory Visit: Payer: Self-pay | Admitting: Cardiology

## 2012-05-28 DIAGNOSIS — G4700 Insomnia, unspecified: Secondary | ICD-10-CM

## 2012-05-28 NOTE — Telephone Encounter (Signed)
Agree with plan 

## 2012-06-02 ENCOUNTER — Telehealth: Payer: Self-pay | Admitting: Internal Medicine

## 2012-06-02 DIAGNOSIS — G4733 Obstructive sleep apnea (adult) (pediatric): Secondary | ICD-10-CM

## 2012-06-02 NOTE — Telephone Encounter (Signed)
[facility]   Replacement CPAP mask and supplies    Dx OSA

## 2012-06-02 NOTE — Telephone Encounter (Signed)
I spoke with spouse and she is requesting an rx to [facility] to renew cpap supplies and mask. Pt last seen 04/2011 and no pending apt. Please advise Dr. Maple Hudson thanks

## 2012-06-02 NOTE — Telephone Encounter (Signed)
Pt's spouse is aware order sent to [facility] for new mask and supplies through [facility].

## 2012-07-01 ENCOUNTER — Other Ambulatory Visit: Payer: Self-pay | Admitting: Orthopedic Surgery

## 2012-07-01 DIAGNOSIS — M7138 Other bursal cyst, other site: Secondary | ICD-10-CM

## 2012-07-01 DIAGNOSIS — M479 Spondylosis, unspecified: Secondary | ICD-10-CM

## 2012-07-02 ENCOUNTER — Ambulatory Visit
Admission: RE | Admit: 2012-07-02 | Discharge: 2012-07-02 | Disposition: A | Discharge status: Home / Self Care | Payer: Medicare Other | Source: Ambulatory Visit | Attending: Orthopedic Surgery | Admitting: Orthopedic Surgery

## 2012-07-02 DIAGNOSIS — M7138 Other bursal cyst, other site: Secondary | ICD-10-CM

## 2012-07-02 DIAGNOSIS — M479 Spondylosis, unspecified: Secondary | ICD-10-CM

## 2012-07-02 MED ORDER — METHYLPREDNISOLONE ACETATE 40 MG/ML INJ SUSP (RADIOLOG
120.0000 mg | Freq: Once | INTRAMUSCULAR | Status: AC
  Administered 2012-07-02: 120 mg via INTRA_ARTICULAR

## 2012-07-02 MED ORDER — IOHEXOL 180 MG/ML  SOLN
1.0000 mL | Freq: Once | INTRAMUSCULAR | Status: AC | PRN
  Administered 2012-07-02: 1 mL via INTRA_ARTICULAR

## 2012-07-08 ENCOUNTER — Other Ambulatory Visit: Payer: Self-pay | Admitting: Orthopedic Surgery

## 2012-07-08 ENCOUNTER — Ambulatory Visit
Admission: RE | Admit: 2012-07-08 | Discharge: 2012-07-08 | Disposition: A | Discharge status: Home / Self Care | Payer: Medicare Other | Source: Ambulatory Visit | Attending: Orthopedic Surgery | Admitting: Orthopedic Surgery

## 2012-07-08 DIAGNOSIS — M7130 Other bursal cyst, unspecified site: Secondary | ICD-10-CM

## 2012-07-09 ENCOUNTER — Other Ambulatory Visit: Payer: Medicare Other

## 2012-08-20 ENCOUNTER — Encounter: Payer: Self-pay | Admitting: Cardiology

## 2012-08-20 ENCOUNTER — Other Ambulatory Visit: Payer: Self-pay | Admitting: Cardiology

## 2012-08-20 ENCOUNTER — Ambulatory Visit (INDEPENDENT_AMBULATORY_CARE_PROVIDER_SITE_OTHER): Payer: Medicare Other | Admitting: Cardiology

## 2012-08-20 VITALS — BP 138/82 | HR 81 | Ht 71.5 in | Wt 252.0 lb

## 2012-08-20 DIAGNOSIS — Z952 Presence of prosthetic heart valve: Secondary | ICD-10-CM

## 2012-08-20 DIAGNOSIS — I4892 Unspecified atrial flutter: Secondary | ICD-10-CM | POA: Insufficient documentation

## 2012-08-20 DIAGNOSIS — I4891 Unspecified atrial fibrillation: Secondary | ICD-10-CM

## 2012-08-20 DIAGNOSIS — I119 Hypertensive heart disease without heart failure: Secondary | ICD-10-CM

## 2012-08-20 DIAGNOSIS — Z954 Presence of other heart-valve replacement: Secondary | ICD-10-CM

## 2012-08-20 DIAGNOSIS — M549 Dorsalgia, unspecified: Secondary | ICD-10-CM | POA: Insufficient documentation

## 2012-08-20 MED ORDER — RIVAROXABAN 15 MG PO TABS: 15.0000 mg | ORAL_TABLET | Freq: Every day | ORAL | Status: DC

## 2012-08-20 NOTE — Assessment & Plan Note (Signed)
EKG today shows atrial flutter with a controlled ventricular response.  We will put the patient on Xarelto 15 mg one daily because he has a GFR of only 32.  He will continue his other medicines the same including baby aspirin.  We will plan to see him back in approximately 3 weeks after adequate anticoagulation and repeat his EKG at that time and consider elective cardioversion if he has not converted on his own

## 2012-08-20 NOTE — Patient Instructions (Signed)
Start Xarelto 15 mg daily   Your physician has requested that you have an echocardiogram. Echocardiography is a painless test that uses sound waves to create images of your heart. It provides your doctor with information about the size and shape of your heart and how well your heart's chambers and valves are working. This procedure takes approximately one hour. There are no restrictions for this procedure.   Your physician recommends that you schedule a follow-up appointment in: end of September

## 2012-08-20 NOTE — Assessment & Plan Note (Signed)
Blood pressure today is stable he will continue same antihypertensive meds

## 2012-08-20 NOTE — Progress Notes (Signed)
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 The patient has a history of any heart disease. He had coronary artery bypass graft surgery in 2006. 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 Postoperatively he had atrial fibrillation and was successfully cardioverted in August of 2012. His Coumadin was subsequent stopped and he takes just aspirin. Patient has history of renal insufficiency and is followed by nephrology. He has a history of arthritis and is followed by Dr. Kellie Simmering. His primary care physician is Dr. Foy Guadalajara. Since last visit has been feeling well except for episode of brief episode of aphasia on May 15. The following day he underwent CT of the brain which was normal and carotid artery and vertebral artery Dopplers were normal. His blood pressure medicine was switched from losartan to Prinivil and since then the patient has had no further problems. For the past several days and possibly longer the patient has had occasional sensation of extra heartbeat.  He has also noticed decreased energy.  His electrocardiogram today shows that he has gone back into atrial flutter with a relatively controlled ventricular response.  Current Outpatient Prescriptions  Medication Sig Dispense Refill  . allopurinol (ZYLOPRIM) 100 MG tablet Take 200 mg by mouth daily.      . Alpha-Lipoic Acid 600 MG CAPS Take 600 mg by mouth daily. Taking 1200 bid      . amLODipine (NORVASC) 5 MG tablet Take 1 tablet (5 mg total) by mouth daily.  90 tablet  3  . aspirin 81 MG tablet Take 81 mg by mouth daily.        . diazepam (VALIUM) 5 MG tablet Take 10 mg by mouth every 6 (six) hours as needed. Taking as  needed      . diclofenac sodium (VOLTAREN) 1 % GEL Apply topically as directed.      . Glucosamine-Chondroitin 750-600 MG TABS Take by mouth 2 (two) times daily. Taking 2 bid      . insulin lispro (HUMALOG) 100 UNIT/ML injection Inject into the skin 2 (two) times daily. Sliding scale      . lisinopril (PRINIVIL,ZESTRIL) 20 MG tablet Take 40 mg by mouth daily.       . Magnesium 400 MG CAPS Take by mouth as needed.       . metFORMIN (GLUCOPHAGE) 500 MG tablet Take 500 mg by mouth 2 (two) times daily with a meal.      . metoprolol tartrate (LOPRESSOR) 25 MG tablet Take 1 tablet (25 mg total) by mouth 2 (two) times daily.  180 tablet  3  . Multiple Vitamin (MULTIVITAMIN) tablet Take 1 tablet by mouth 2 (two) times daily.      [name] terazosin (HYTRIN) 10 MG capsule Take 1 capsule (10 mg total) by mouth daily. Once a day  90 capsule  3  . TESTOSTERONE IM Inject 200 Units into the muscle. As directed      . traMADol (ULTRAM) 50 MG tablet Take 50 mg by mouth every 6 (six) hours as needed. 50 to 100 prn      . zolpidem (AMBIEN) 10 MG tablet Take 1 tablet (10 mg total) by mouth at bedtime  as needed.  30 tablet  5  . DISCONTD: allopurinol (ZYLOPRIM) 100 MG tablet Take 1 tablet (100 mg total) by mouth daily.  90 tablet  3    Allergies  Allergen Reactions  . Actos (Pioglitazone Hydrochloride) Swelling  . Penicillins Anaphylaxis  . Amiodarone Other (See Comments)    diarrhea  . Latex Swelling  . Pioglitazone Other (See Comments)    REACTION: causes CHF and edema  . Norvasc (Amlodipine Besylate)   . Orudis (Ketoprofen)   . Pentazocine Lactate   . Ramipril   . Sertraline Hcl   . Benazepril Hcl     ? Possible lowers plateletes  . Indomethacin Other (See Comments)    Renal problems   . Minocycline Other (See Comments)    Makes dizzy and feel just lousy.    Patient Active Problem List  Diagnosis  . Sarcoidosis  . OBSTRUCTIVE SLEEP APNEA  . MYOCARDIAL INFARCTION  . CORONARY HEART DISEASE  .  AORTIC STENOSIS  . VALVULAR HEART DISEASE  . DYSPNEA  . Hx of CABG  . Diabetes mellitus  . Gout  . BPH (benign prostatic hyperplasia)  . Benign hypertensive heart disease without heart failure  . Chronic kidney disease (CKD), stage III (moderate)  . Atrial fibrillation with controlled ventricular response  . Severe tricuspid regurgitation by prior echocardiogram  . S/P AVR (aortic valve replacement), Tricuspid Valve repair    History  Smoking status  . Former Smoker  . Types: Cigarettes, Pipe  . Quit date: 12/25/1995  Smokeless tobacco  . Not on file    History  Alcohol Use No    Family History  Problem Relation Age of Onset  . Heart attack Father   . Allergies    . Asthma    . Heart disease    . Cancer      Review of Systems: Constitutional: no fever chills diaphoresis or fatigue or change in weight.  Head and neck: no hearing loss, no epistaxis, no photophobia or visual disturbance. Respiratory: No cough, shortness of breath or wheezing. Cardiovascular: No chest pain peripheral edema, palpitations. Gastrointestinal: No abdominal distention, no abdominal pain, no change in bowel habits hematochezia or melena. Genitourinary: No dysuria, no frequency, no urgency, no nocturia. Musculoskeletal:No arthralgias, no back pain, no gait disturbance or myalgias. Neurological: No dizziness, no headaches, no numbness, no seizures, no syncope, no weakness, no tremors. Hematologic: No lymphadenopathy, no easy bruising. Psychiatric: No confusion, no hallucinations, no sleep disturbance.    Physical Exam: Filed Vitals:   08/20/12 1346  BP: 138/82  Pulse: 81   the general appearance reveals a large gentleman in no acute distress.Pupils equal and reactive.   Extraocular Movements are full.  There is no scleral icterus.  The mouth and pharynx are normal.  The neck is supple.  The carotids reveal no bruits.  The jugular venous pressure is normal.  The thyroid is not enlarged.   There is no lymphadenopathy.  The chest is clear to percussion and auscultation. There are no rales or rhonchi. Expansion of the chest is symmetrical.  Heart reveals a soft systolic flow murmur across his prosthetic aortic valve.  The rhythm is slightly irregular Abdomen is obese and nontender Telemetry show no edema or phlebitis Strength is normal and symmetrical in all extremities.  There is no lateralizing weakness.  There are no sensory deficits.  EKG with long rhythm strip shows slow atrial flutter with a controlled ventricular response   Assessment / Plan: Continue same medication with  the addition of Xarelto 15 mg one daily. We will also update his echocardiogram. Consider cardioversion electively in about 3 weeks after his next office visit and EKG

## 2012-08-20 NOTE — Assessment & Plan Note (Signed)
Since March 2013 the patient has had recurring low back pain.  He was diagnosed as having a synovial cyst at L4-L5.  He has had the cyst drained several times with instillation of steroids and analgesics with temporary relief.  He has seen neurosurgery and also a Land.

## 2012-08-21 ENCOUNTER — Telehealth: Payer: Self-pay | Admitting: *Deleted

## 2012-08-21 DIAGNOSIS — I4892 Unspecified atrial flutter: Secondary | ICD-10-CM

## 2012-08-21 DIAGNOSIS — I4891 Unspecified atrial fibrillation: Secondary | ICD-10-CM

## 2012-08-21 MED ORDER — METOPROLOL TARTRATE 50 MG PO TABS: ORAL_TABLET | ORAL | Status: DC

## 2012-08-21 NOTE — Telephone Encounter (Signed)
Patient called requesting name brand Lopressor be sent to [facility].  Had to change Lopressor NB to 50 mg and adjust dose since it is not available in 25 mg tablets.

## 2012-08-26 ENCOUNTER — Ambulatory Visit (HOSPITAL_COMMUNITY): Payer: Medicare Other | Attending: Cardiology | Admitting: Radiology

## 2012-08-26 DIAGNOSIS — I059 Rheumatic mitral valve disease, unspecified: Secondary | ICD-10-CM | POA: Insufficient documentation

## 2012-08-26 DIAGNOSIS — I359 Nonrheumatic aortic valve disorder, unspecified: Secondary | ICD-10-CM

## 2012-08-26 DIAGNOSIS — I4891 Unspecified atrial fibrillation: Secondary | ICD-10-CM | POA: Insufficient documentation

## 2012-08-26 DIAGNOSIS — I4892 Unspecified atrial flutter: Secondary | ICD-10-CM

## 2012-08-26 DIAGNOSIS — N189 Chronic kidney disease, unspecified: Secondary | ICD-10-CM | POA: Insufficient documentation

## 2012-08-26 DIAGNOSIS — Z952 Presence of prosthetic heart valve: Secondary | ICD-10-CM

## 2012-08-26 DIAGNOSIS — I379 Nonrheumatic pulmonary valve disorder, unspecified: Secondary | ICD-10-CM | POA: Insufficient documentation

## 2012-08-26 DIAGNOSIS — E119 Type 2 diabetes mellitus without complications: Secondary | ICD-10-CM | POA: Insufficient documentation

## 2012-08-26 DIAGNOSIS — G4733 Obstructive sleep apnea (adult) (pediatric): Secondary | ICD-10-CM | POA: Insufficient documentation

## 2012-08-26 DIAGNOSIS — I2510 Atherosclerotic heart disease of native coronary artery without angina pectoris: Secondary | ICD-10-CM | POA: Insufficient documentation

## 2012-08-26 DIAGNOSIS — I079 Rheumatic tricuspid valve disease, unspecified: Secondary | ICD-10-CM | POA: Insufficient documentation

## 2012-08-26 NOTE — Progress Notes (Signed)
Echocardiogram performed.  

## 2012-09-02 ENCOUNTER — Telehealth: Payer: Self-pay | Admitting: *Deleted

## 2012-09-02 NOTE — Telephone Encounter (Signed)
Advised of echo  

## 2012-09-02 NOTE — Telephone Encounter (Signed)
Message copied by Burnell Blanks on Tue Sep 02, 2012  5:16 PM ------      Message from: Cassell Clement      Created: Tue Aug 26, 2012  9:04 PM       Please report.  LV systolic function is good. EF 50-55%.  There is diastolic dysfunction with high filling pressures. Left atrium is large. Prosthetic aortic valve functioning okay.

## 2012-09-25 ENCOUNTER — Encounter: Payer: Self-pay | Admitting: Cardiology

## 2012-09-25 ENCOUNTER — Ambulatory Visit (INDEPENDENT_AMBULATORY_CARE_PROVIDER_SITE_OTHER): Payer: Medicare Other | Admitting: Cardiology

## 2012-09-25 VITALS — BP 140/82 | HR 73 | Ht 71.0 in | Wt 247.8 lb

## 2012-09-25 DIAGNOSIS — I4891 Unspecified atrial fibrillation: Secondary | ICD-10-CM

## 2012-09-25 DIAGNOSIS — I119 Hypertensive heart disease without heart failure: Secondary | ICD-10-CM

## 2012-09-25 DIAGNOSIS — I2510 Atherosclerotic heart disease of native coronary artery without angina pectoris: Secondary | ICD-10-CM

## 2012-09-25 DIAGNOSIS — I491 Atrial premature depolarization: Secondary | ICD-10-CM

## 2012-09-25 DIAGNOSIS — I480 Paroxysmal atrial fibrillation: Secondary | ICD-10-CM

## 2012-09-25 MED ORDER — METOPROLOL TARTRATE 25 MG PO TABS: ORAL_TABLET | ORAL | Status: DC

## 2012-09-25 NOTE — Assessment & Plan Note (Signed)
The patient has not been experiencing any recurrent chest pain or angina. 

## 2012-09-25 NOTE — Patient Instructions (Addendum)
Increase Lopressor to 25 mg three times a day    Your physician recommends that you schedule a follow-up appointment in: 3 months with a EKG

## 2012-09-25 NOTE — Assessment & Plan Note (Signed)
The patient has had less dyspnea.  He is now singing and 2 separate barbershop groups.  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

## 2012-09-25 NOTE — Progress Notes (Signed)
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 The patient has a history of  heart disease. He had coronary artery bypass graft surgery in 2006. 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 Postoperatively he had atrial fibrillation and was successfully cardioverted in August of 2012. His Coumadin was subsequent stopped and he takes just aspirin. Patient has history of renal insufficiency and is followed by nephrology. He has a history of arthritis and is followed by Dr. Kellie Simmering. His primary care physician is Dr. Foy Guadalajara. Since last visit has been feeling well except for episode of brief episode of aphasia on May 15. The following day he underwent CT of the brain which was normal and carotid artery and vertebral artery Dopplers were normal. His blood pressure medicine was switched from losartan to Prinivil and since then the patient has had no further problems.  In August 2013 the patient was seen as a work in because of palpitations and was found to be back in atrial flutter fibrillation.  At that time he was placed back on Xarelto 15 mg daily.  He had an echocardiogram on 08/26/12 showing normal left ventricular systolic function with ejection fraction of 50-55%.  He did have evidence of diastolic dysfunction with high left ventricular filling pressures.  He had a large left atrium.  He had only mild tricuspid regurgitation and no pulmonary hypertension.  His prosthetic aortic valve appeared to be functioning normally.    Current Outpatient Prescriptions  Medication Sig Dispense Refill  . allopurinol (ZYLOPRIM) 100 MG tablet Take 200 mg by mouth daily.      . Alpha-Lipoic Acid 600 MG CAPS Take  600 mg by mouth daily. Taking 1200 bid      . amLODipine (NORVASC) 5 MG tablet Take 1 tablet (5 mg total) by mouth daily.  90 tablet  3  . diclofenac sodium (VOLTAREN) 1 % GEL Apply topically as directed.      . Glucosamine-Chondroitin 750-600 MG TABS Take by mouth 2 (two) times daily. Taking 2 bid      . insulin lispro (HUMALOG) 100 UNIT/ML injection Inject into the skin 2 (two) times daily. Sliding scale      . lisinopril (PRINIVIL,ZESTRIL) 20 MG tablet Take 40 mg by mouth daily.       . Magnesium 400 MG CAPS Take by mouth as needed.       . metFORMIN (GLUCOPHAGE) 500 MG tablet Take 500 mg by mouth 2 (two) times daily with a meal.      . Multiple Vitamin (MULTIVITAMIN) tablet Take 1 tablet by mouth 2 (two) times daily.      . Rivaroxaban (XARELTO) 15 MG TABS tablet Take 1 tablet (15 mg total) by mouth daily.  30 tablet  1  . terazosin (HYTRIN) 10 MG capsule Take 1 capsule (10 mg total) by mouth daily. Once a day  90 capsule  3  . TESTOSTERONE IM Inject 200 Units into the muscle. As directed      . traMADol (ULTRAM) 50 MG tablet Take 50 mg by mouth every 6 (six) hours as needed. 50 to 100 prn      . zolpidem (AMBIEN) 10 MG tablet Take  1 tablet (10 mg total) by mouth at bedtime as needed.  30 tablet  5  . metoprolol tartrate (LOPRESSOR) 25 MG tablet Take 25 mg three times a day  90 tablet  6    Allergies  Allergen Reactions  . Actos (Pioglitazone Hydrochloride) Swelling  . Penicillins Anaphylaxis  . Amiodarone Other (See Comments)    diarrhea  . Latex Swelling  . Pioglitazone Other (See Comments)    REACTION: causes CHF and edema  . Norvasc (Amlodipine Besylate)   . Orudis (Ketoprofen)   . Pentazocine Lactate   . Ramipril   . Sertraline Hcl   . Benazepril Hcl     ? Possible lowers plateletes  . Indomethacin Other (See Comments)    Renal problems   . Minocycline Other (See Comments)    Makes dizzy and feel just lousy.    Patient Active Problem List  Diagnosis  . Sarcoidosis    . OBSTRUCTIVE SLEEP APNEA  . MYOCARDIAL INFARCTION  . CORONARY HEART DISEASE  . AORTIC STENOSIS  . VALVULAR HEART DISEASE  . DYSPNEA  . Hx of CABG  . Diabetes mellitus  . Gout  . BPH (benign prostatic hyperplasia)  . Benign hypertensive heart disease without heart failure  . Chronic kidney disease (CKD), stage III (moderate)  . Atrial fibrillation with controlled ventricular response  . Severe tricuspid regurgitation by prior echocardiogram  . S/P AVR (aortic valve replacement), Tricuspid Valve repair  . Atrial flutter  . Back pain    History  Smoking status  . Former Smoker  . Types: Cigarettes, Pipe  . Quit date: 12/25/1995  Smokeless tobacco  . Not on file    History  Alcohol Use No    Family History  Problem Relation Age of Onset  . Heart attack Father   . Allergies    . Asthma    . Heart disease    . Cancer      Review of Systems: Constitutional: no fever chills diaphoresis or fatigue or change in weight.  Head and neck: no hearing loss, no epistaxis, no photophobia or visual disturbance. Respiratory: No cough, shortness of breath or wheezing. Cardiovascular: No chest pain peripheral edema, palpitations. Gastrointestinal: No abdominal distention, no abdominal pain, no change in bowel habits hematochezia or melena. Genitourinary: No dysuria, no frequency, no urgency, no nocturia. Musculoskeletal:No arthralgias, no back pain, no gait disturbance or myalgias. Neurological: No dizziness, no headaches, no numbness, no seizures, no syncope, no weakness, no tremors. Hematologic: No lymphadenopathy, no easy bruising. Psychiatric: No confusion, no hallucinations, no sleep disturbance.    Physical Exam: Filed Vitals:   09/25/12 1530  BP: 140/82  Pulse: 73   the general appearance reveals a large gentleman in no distress.  His weight is down 5 pounds to 247.Pupils equal and reactive.   Extraocular Movements are full.  There is no scleral icterus.  The mouth and  pharynx are normal.  The neck is supple.  The carotids reveal no bruits.  The jugular venous pressure is normal.  The thyroid is not enlarged.  There is no lymphadenopathy.  Chest reveals no wheezes or rales Heart reveals a grade 2/6 systolic flow murmur across his prosthetic aortic valve Abdomen is quite obese and nontender. Extremities show no phlebitis and no significant edema  EKG today shows sinus rhythm with premature supraventricular complexes and a right bundle branch block pattern  Assessment / Plan: To help with blood pressure control and to decrease his PACs we will increase his metoprolol  tartrate to 25 mg 3 times a day.  Continue efforts at weight loss.  Recheck in 3 months for followup office visit and EKG.  Continue Xarelto.

## 2012-09-25 NOTE — Assessment & Plan Note (Signed)
The patient returns today for followup.  Our thought had been to prepare for cardioversion if he was still in atrial flutter fibrillation.  However today's electrocardiogram shows that he is back in normal sinus rhythm with occasional premature atrial beats.  Therefore cardioversion will not be necessary.  The patient is asking whether he still needs to stay on Xarelto.  We talked about the CHADSS score and his score was elevated because of congestive heart failure, hypertension, diabetes, and age 71.  He goes to stay on long-term Xarelto.  Because of his coronary artery disease we will also continue a baby aspirin daily

## 2012-10-13 ENCOUNTER — Other Ambulatory Visit: Payer: Self-pay | Admitting: *Deleted

## 2012-10-13 DIAGNOSIS — I4892 Unspecified atrial flutter: Secondary | ICD-10-CM

## 2012-10-13 MED ORDER — RIVAROXABAN 15 MG PO TABS: 15.0000 mg | ORAL_TABLET | Freq: Every day | ORAL | Status: DC

## 2012-11-26 ENCOUNTER — Other Ambulatory Visit: Payer: Self-pay | Admitting: *Deleted

## 2012-11-26 DIAGNOSIS — G4700 Insomnia, unspecified: Secondary | ICD-10-CM

## 2012-11-27 MED ORDER — ZOLPIDEM TARTRATE 10 MG PO TABS: 10.0000 mg | ORAL_TABLET | Freq: Every evening | ORAL | Status: DC | PRN

## 2012-12-01 ENCOUNTER — Telehealth: Payer: Self-pay | Admitting: Internal Medicine

## 2012-12-01 DIAGNOSIS — G4733 Obstructive sleep apnea (adult) (pediatric): Secondary | ICD-10-CM

## 2012-12-01 NOTE — Telephone Encounter (Signed)
Pt hasn't been seen since 2012. Would you be okay with changing his DME company without a recent OV? Please advise. Thanks!

## 2012-12-01 NOTE — Telephone Encounter (Signed)
Ok  for [facility] to d/c [name] and change to [facility] as he requests. Need to define which services (O2?) need to be switched so we can start looking for the documentation to support the service.

## 2012-12-04 NOTE — Telephone Encounter (Signed)
Order placed for bipap supplies and mask. Pt owns bipap machine. Carron Curie, CMA

## 2012-12-09 ENCOUNTER — Telehealth: Payer: Self-pay | Admitting: Cardiology

## 2012-12-09 NOTE — Telephone Encounter (Signed)
No SBE prophylaxis needed for that procedure.

## 2012-12-09 NOTE — Telephone Encounter (Signed)
Advised patient

## 2012-12-09 NOTE — Telephone Encounter (Signed)
Will forward to  Dr. Brackbill for review 

## 2012-12-09 NOTE — Telephone Encounter (Signed)
Pt is going to have 3 injections in his knee over the next couple of weeks and they will be injecting spartz and he wants to know if he will need SBE profalactic prior to injections

## 2012-12-29 ENCOUNTER — Encounter: Payer: Self-pay | Admitting: Cardiology

## 2012-12-30 ENCOUNTER — Telehealth: Payer: Self-pay | Admitting: Cardiology

## 2012-12-30 NOTE — Telephone Encounter (Signed)
New Problem:    Patient called in wanting to let you know to call and ask about receiving his lab work from [facility], Dr. Abel Presto ext. [phone].  No need to call back.

## 2012-12-31 ENCOUNTER — Ambulatory Visit (INDEPENDENT_AMBULATORY_CARE_PROVIDER_SITE_OTHER): Payer: Medicare Other | Admitting: Cardiology

## 2012-12-31 ENCOUNTER — Encounter: Payer: Self-pay | Admitting: Cardiology

## 2012-12-31 VITALS — BP 122/68 | HR 56 | Resp 18 | Ht 71.0 in | Wt 261.8 lb

## 2012-12-31 DIAGNOSIS — I4892 Unspecified atrial flutter: Secondary | ICD-10-CM

## 2012-12-31 DIAGNOSIS — I119 Hypertensive heart disease without heart failure: Secondary | ICD-10-CM

## 2012-12-31 DIAGNOSIS — I2510 Atherosclerotic heart disease of native coronary artery without angina pectoris: Secondary | ICD-10-CM

## 2012-12-31 DIAGNOSIS — I4891 Unspecified atrial fibrillation: Secondary | ICD-10-CM

## 2012-12-31 NOTE — Progress Notes (Signed)
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 The patient has a history of heart disease. He had coronary artery bypass graft surgery in 2006. 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 Postoperatively he had atrial fibrillation and was successfully cardioverted in August of 2012. His Coumadin was subsequent stopped and he takes just aspirin. Patient has history of renal insufficiency and is followed by nephrology. He has a history of arthritis and is followed by Dr. Kellie Simmering. His primary care physician is Dr. Foy Guadalajara. Since last visit has been feeling well except for episode of brief episode of aphasia on May 15. The following day he underwent CT of the brain which was normal and carotid artery and vertebral artery Dopplers were normal. His blood pressure medicine was switched from losartan to Prinivil and since then the patient has had no further problems. In August 2013 the patient was seen as a work in because of palpitations and was found to be back in atrial flutter fibrillation. At that time he was placed back on Xarelto 15 mg daily. He had an echocardiogram on 08/26/12 showing normal left ventricular systolic function with ejection fraction of 50-55%. He did have evidence of diastolic dysfunction with high left ventricular filling pressures. He had a large left atrium. He had only mild tricuspid regurgitation and no pulmonary hypertension. His prosthetic aortic valve appeared to be functioning normally.  His EKG on 09/25/12 showed normal sinus rhythm.  Today his EKG shows that he is back in atrial flutter with a regular 4-1 ventricular response.   Current Outpatient Prescriptions  Medication  Sig Dispense Refill  . allopurinol (ZYLOPRIM) 100 MG tablet Take 200 mg by mouth daily.      . Alpha-Lipoic Acid 600 MG CAPS Take 600 mg by mouth daily. Taking 1200 bid      . amLODipine (NORVASC) 5 MG tablet Take 5 mg by mouth daily.      . diazepam (VALIUM) 10 MG tablet Take 10 mg by mouth every 6 (six) hours as needed.      . diclofenac sodium (VOLTAREN) 1 % GEL Apply topically as directed.      . Glucosamine-Chondroitin 750-600 MG TABS Take by mouth 2 (two) times daily. Taking 2 bid      . insulin lispro (HUMALOG) 100 UNIT/ML injection Inject into the skin 2 (two) times daily. Sliding scale      . lisinopril (PRINIVIL,ZESTRIL) 20 MG tablet Take 40 mg by mouth daily.       . Magnesium 400 MG CAPS Take by mouth as needed.       . metFORMIN (GLUCOPHAGE) 500 MG tablet Take 500 mg by mouth 2 (two) times daily with a meal.      . metoprolol tartrate (LOPRESSOR) 25 MG tablet Take 25 mg three times a day  90 tablet  6  . Multiple Vitamin (MULTIVITAMIN) tablet Take 1 tablet by mouth 2 (two) times daily.      . Rivaroxaban (XARELTO) 15 MG TABS tablet Take 1 tablet (15 mg total) by mouth daily.  30 tablet  6  . Sodium Hyaluronate, Viscosup, (SUPARTZ IX) Inject into the articular space.      [name]  terazosin (HYTRIN) 10 MG capsule Take 1 capsule (10 mg total) by mouth daily. Once a day  90 capsule  3  . TESTOSTERONE IM Inject 200 Units into the muscle. As directed      . traMADol (ULTRAM) 50 MG tablet Take 50 mg by mouth every 6 (six) hours as needed. 50 to 100 prn      . zolpidem (AMBIEN) 10 MG tablet Take 1 tablet (10 mg total) by mouth at bedtime as needed.  30 tablet  5    Allergies  Allergen Reactions  . Actos (Pioglitazone Hydrochloride) Swelling  . Penicillins Anaphylaxis  . Amiodarone Other (See Comments)    diarrhea  . Latex Swelling  . Pioglitazone Other (See Comments)    REACTION: causes CHF and edema  . Norvasc (Amlodipine Besylate)   . Orudis (Ketoprofen)   . Pentazocine Lactate   .  Ramipril   . Sertraline Hcl   . Benazepril Hcl     ? Possible lowers plateletes  . Indomethacin Other (See Comments)    Renal problems   . Minocycline Other (See Comments)    Makes dizzy and feel just lousy.    Patient Active Problem List  Diagnosis  . Sarcoidosis  . OBSTRUCTIVE SLEEP APNEA  . MYOCARDIAL INFARCTION  . CORONARY HEART DISEASE  . AORTIC STENOSIS  . VALVULAR HEART DISEASE  . DYSPNEA  . Hx of CABG  . Diabetes mellitus  . Gout  . BPH (benign prostatic hyperplasia)  . Benign hypertensive heart disease without heart failure  . Chronic kidney disease (CKD), stage III (moderate)  . Atrial fibrillation with controlled ventricular response  . Severe tricuspid regurgitation by prior echocardiogram  . S/P AVR (aortic valve replacement), Tricuspid Valve repair  . Atrial flutter  . Back pain    History  Smoking status  . Former Smoker  . Types: Cigarettes, Pipe  . Quit date: 12/25/1995  Smokeless tobacco  . Not on file    History  Alcohol Use No    Family History  Problem Relation Age of Onset  . Heart attack Father   . Allergies    . Asthma    . Heart disease    . Cancer      Review of Systems: Constitutional: no fever chills diaphoresis or fatigue or change in weight.  Head and neck: no hearing loss, no epistaxis, no photophobia or visual disturbance. Respiratory: No cough, shortness of breath or wheezing. Cardiovascular: No chest pain peripheral edema, palpitations. Gastrointestinal: No abdominal distention, no abdominal pain, no change in bowel habits hematochezia or melena. Genitourinary: No dysuria, no frequency, no urgency, no nocturia. Musculoskeletal: He has been getting a series of injections into his right knee from his orthopedist Dr. Charlett Blake. Neurological: No dizziness, no headaches, no numbness, no seizures, no syncope, no weakness, no tremors. Hematologic: No lymphadenopathy, no easy bruising. Psychiatric: No confusion, no  hallucinations, no sleep disturbance.    Physical Exam: Filed Vitals:   12/31/12 1429  BP: 122/68  Pulse: 56  Resp: 18   the general appearance reveals a well-developed well-nourished gentleman in no distress.  He is overweight.The head and neck exam reveals pupils equal and reactive.  Extraocular movements are full.  There is no scleral icterus.  The mouth and pharynx are normal.  The neck is supple.  The carotids reveal no bruits.  The jugular venous pressure is normal.  The  thyroid is not enlarged.  There is no lymphadenopathy.  The chest is clear to percussion  and auscultation.  There are no rales or rhonchi.  Expansion of the chest is symmetrical.  The precordium is quiet.  The first heart sound is normal.  The second heart sound is physiologically split.  There is a grade 2/6 systolic ejection murmur across the prosthetic aortic valve.  No aortic insufficiency.  No gallop or rub.  There is no abnormal lift or heave.  The abdomen is soft and nontender.  The bowel sounds are normal.  The liver and spleen are not enlarged.  There are no abdominal masses.  There are no abdominal bruits.  Extremities reveal good pedal pulses.  There is no phlebitis or edema.  There is no cyanosis or clubbing.  Strength is normal and symmetrical in all extremities.  There is no lateralizing weakness.  There are no sensory deficits.  The skin is warm and dry.  There is no rash.   EKG shows atrial flutter with a ventricular response of 56.  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  Assessment / Plan: Continue same medication.  He needs to be on long-term Xarelto as he goes in and out of atrial flutter.  Recheck in 3 months for followup office visit and EKG.

## 2012-12-31 NOTE — Assessment & Plan Note (Signed)
Today he is back in atrial flutter with a regular ventricular response.  He thinks he may have gone back into atrial flutter after having a nightmare last evening.  He is not having any new symptoms in regard to the change in his rhythm.  He is on long-term Xarelto.  He is not having any TIA symptoms

## 2012-12-31 NOTE — Telephone Encounter (Signed)
Requested records.

## 2012-12-31 NOTE — Assessment & Plan Note (Signed)
The patient has not been having any recurrent chest pain or angina.  He goes to the [employer] 3 times a week to do deep water aerobics

## 2012-12-31 NOTE — Patient Instructions (Addendum)
Your physician recommends that you continue on your current medications as directed. Please refer to the Current Medication list given to you today.  Your physician recommends that you schedule a follow-up appointment in: 3 month ov/ekg 

## 2012-12-31 NOTE — Assessment & Plan Note (Signed)
Is not having any headaches or dizzy spells.  No symptoms of congestive heart failure.  He is not having any significant peripheral edema.  His weight is up 14 pounds since last visit which he attributes to overeating

## 2013-01-26 ENCOUNTER — Telehealth: Payer: Self-pay | Admitting: *Deleted

## 2013-01-26 NOTE — Telephone Encounter (Signed)
He may do better with 50 mg in the morning, 25 mg at noon and 25 mg in the evening for a total of 100 mg daily

## 2013-01-26 NOTE — Telephone Encounter (Signed)
Advised patient

## 2013-01-26 NOTE — Telephone Encounter (Signed)
Last week for 5 days he took Metoprolol 50 mg morning and evening and 25 mg once during the day  (should be 25 mg three times a day). States Friday he wasn't feeling well and he took his blood pressure and systolic around 102 and heart rate 60.  After taking am dose Friday realized that he had been taking Metoprolol wrong. He did not take his mid day dose and took his 25 mg that evening.  Since then his blood pressure has been running 170-190/95-100 (this am 180/110 before am meds) heart rate 60-112.  Did come down this afternoon after his second dose of Metoprolol to 141-145/85-90 heart rate 75. Will forward to  Dr. Patty Sermons for review

## 2013-01-30 ENCOUNTER — Telehealth: Payer: Self-pay | Admitting: Cardiology

## 2013-01-30 NOTE — Telephone Encounter (Signed)
Blood pressure 150-170/93-100 and heart rate 75-95 at rest. Has two different machines and they are consistent. Increased fatigue. Is doing water aerobics. Has routine ov with Dr Foy Guadalajara on 2/12. Will forward to  Dr. Patty Sermons for review

## 2013-01-30 NOTE — Telephone Encounter (Signed)
Left message to call back  

## 2013-01-30 NOTE — Telephone Encounter (Signed)
Advised patient

## 2013-01-30 NOTE — Telephone Encounter (Signed)
Try increasing amlodipine 5 mg to twice a day.

## 2013-01-30 NOTE — Telephone Encounter (Signed)
New problem    Patient did not disclose any information would like to speak with nurse

## 2013-02-07 ENCOUNTER — Other Ambulatory Visit: Payer: Self-pay

## 2013-02-11 ENCOUNTER — Telehealth: Payer: Self-pay | Admitting: Cardiology

## 2013-02-11 NOTE — Telephone Encounter (Signed)
Agree with current plan

## 2013-02-11 NOTE — Telephone Encounter (Signed)
Patient continues to have a high systolic, low diastolic, and low heart rate.  Saw Dr Foy Guadalajara and he called Dr Arrie Aran and they thought he needed to go on Bumex 0.5 mg daily. He is to follow up with labs and Dr Foy Guadalajara in about 2 weeks.  Wanted to let  Dr. Patty Sermons know and keep him informed. If  Dr. Patty Sermons does not agree than he will do whatever he thinks is best. Will forward to  Dr. Patty Sermons for review.

## 2013-02-11 NOTE — Telephone Encounter (Signed)
Pt needs a call in about 20 min and per pt  You already know what this is about

## 2013-02-16 ENCOUNTER — Telehealth: Payer: Self-pay | Admitting: Cardiology

## 2013-02-16 DIAGNOSIS — Z792 Long term (current) use of antibiotics: Secondary | ICD-10-CM

## 2013-02-16 MED ORDER — AZITHROMYCIN 250 MG PO TABS: ORAL_TABLET | ORAL | Status: DC

## 2013-02-16 NOTE — Telephone Encounter (Signed)
Is actually going today at 2 needs Rx called in.  Allergic to PCN  Will use Zithromax 250 mg 2 one hour prior to procedure per  Dr. Patty Sermons, sent to pharmacy and advised patient

## 2013-02-16 NOTE — Telephone Encounter (Signed)
New problem    Dental cleaning appt today at 2 pm. Or tomorrow.  Please advise on pre-med.

## 2013-03-12 ENCOUNTER — Telehealth: Payer: Self-pay | Admitting: Cardiology

## 2013-03-12 MED ORDER — METOPROLOL TARTRATE 50 MG PO TABS: 50.0000 mg | ORAL_TABLET | Freq: Two times a day (BID) | ORAL | Status: DC

## 2013-03-12 NOTE — Telephone Encounter (Signed)
New problem   Pt wants to know when dr Patty Sermons wants to see him again since he's been seeing several different doctors-he may not need/want to see him but he wanted to make sure before scheduling an appt- pt also has a question regarding medications

## 2013-03-12 NOTE — Telephone Encounter (Signed)
Patient called requesting a refill on him Metoprolol. States he did increase his Metoprolol to 25 mg 2 twice a day and would like Rx sent in for 50 mg twice a day.

## 2013-03-16 NOTE — Telephone Encounter (Signed)
We saw him in January.  We should see him in mid-April for OV and EKG

## 2013-03-17 ENCOUNTER — Encounter: Payer: Self-pay | Admitting: Cardiology

## 2013-03-17 ENCOUNTER — Ambulatory Visit (INDEPENDENT_AMBULATORY_CARE_PROVIDER_SITE_OTHER): Payer: Medicare Other | Admitting: Cardiology

## 2013-03-17 VITALS — BP 122/72 | HR 58 | Ht 72.0 in | Wt 262.8 lb

## 2013-03-17 DIAGNOSIS — Z952 Presence of prosthetic heart valve: Secondary | ICD-10-CM

## 2013-03-17 DIAGNOSIS — Z954 Presence of other heart-valve replacement: Secondary | ICD-10-CM

## 2013-03-17 DIAGNOSIS — I2510 Atherosclerotic heart disease of native coronary artery without angina pectoris: Secondary | ICD-10-CM

## 2013-03-17 DIAGNOSIS — I4892 Unspecified atrial flutter: Secondary | ICD-10-CM

## 2013-03-17 NOTE — Assessment & Plan Note (Signed)
Patient is on Xarelto for long-term anticoagulation.  He has not had any further TIA symptoms.

## 2013-03-17 NOTE — Telephone Encounter (Signed)
Left message to call back  

## 2013-03-17 NOTE — Assessment & Plan Note (Signed)
The patient is on generic Bumex 0.5 mg daily.  He notes that nephrology has noted worsening renal function recently.

## 2013-03-17 NOTE — Progress Notes (Signed)
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 The patient has a history of heart disease. He had coronary artery bypass graft surgery in 2006. 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 Postoperatively he had atrial fibrillation and was successfully cardioverted in August of 2012. Patient has history of renal insufficiency and is followed by nephrology. He has a history of arthritis and is followed by Dr. Kellie Simmering. His primary care physician is Dr. Foy Guadalajara.  The patient has diabetes and is followed by Dr. Sharl Ma.  Patient has a history of osteoarthritis and is followed by Dr. Charlett Blake.  In August 2013 the patient was seen as a work in because of palpitations and was found to be back in atrial flutter fibrillation. At that time he was placed back on Xarelto 15 mg daily. He had an echocardiogram on 08/26/12 showing normal left ventricular systolic function with ejection fraction of 50-55%. He did have evidence of diastolic dysfunction with high left ventricular filling pressures. He had a large left atrium. He had only mild tricuspid regurgitation and no pulmonary hypertension. His prosthetic aortic valve appeared to be functioning normally. His EKG on 09/25/12 showed normal sinus rhythm.  On his most recent previous visit on 12/31/12 his EKG shows that he is back in atrial flutter with a regular 4-1 ventricular response.  Today his electrocardiogram is unchanged and shows stable atrial flutter with a 4-1 ventricular response.   Current Outpatient Prescriptions  Medication Sig Dispense Refill  . allopurinol (ZYLOPRIM) 100 MG tablet Take 200 mg by mouth daily.      . Alpha-Lipoic Acid 600 MG CAPS Take 600  mg by mouth daily. Taking 1200 bid      . amLODipine (NORVASC) 5 MG tablet Take 5 mg by mouth 2 (two) times daily.       [name] aspirin EC 81 MG tablet Take 81 mg by mouth daily.      [name] azithromycin (ZITHROMAX) 250 MG tablet 2 tablets 1 hour prior to procedure  6 each  0  . bumetanide (BUMEX) 1 MG tablet Take 1 mg by mouth as directed. 1/2 tablet daily      . diazepam (VALIUM) 10 MG tablet Take 10 mg by mouth every 6 (six) hours as needed.      . diclofenac sodium (VOLTAREN) 1 % GEL Apply topically as directed.      . Glucosamine-Chondroitin 750-600 MG TABS Take by mouth 2 (two) times daily. Taking 2 bid      . insulin lispro (HUMALOG) 100 UNIT/ML injection Inject into the skin 2 (two) times daily. Sliding scale      . lisinopril (PRINIVIL,ZESTRIL) 20 MG tablet Take 40 mg by mouth daily.       . Magnesium 400 MG CAPS Take by mouth as needed.       . metFORMIN (GLUCOPHAGE) 500 MG tablet Take 500 mg by mouth 2 (two) times daily with a meal.      . metoprolol tartrate (LOPRESSOR) 50 MG tablet Take 1 tablet (50 mg total) by mouth 2 (two) times daily.  180 tablet  3  . Multiple Vitamin (MULTIVITAMIN) tablet Take 1 tablet by mouth 2 (two) times daily.      [name]  Rivaroxaban (XARELTO) 15 MG TABS tablet Take 1 tablet (15 mg total) by mouth daily.  30 tablet  6  . terazosin (HYTRIN) 10 MG capsule Take 1 capsule (10 mg total) by mouth daily. Once a day  90 capsule  3  . TESTOSTERONE IM Inject 200 Units into the muscle. As directed      . traMADol (ULTRAM) 50 MG tablet Take 50 mg by mouth every 6 (six) hours as needed. 50 to 100 prn      . zolpidem (AMBIEN) 10 MG tablet Take 1 tablet (10 mg total) by mouth at bedtime as needed.  30 tablet  5   No current facility-administered medications for this visit.    Allergies  Allergen Reactions  . Actos (Pioglitazone Hydrochloride) Swelling  . Penicillins Anaphylaxis  . Amiodarone Other (See Comments)    diarrhea  . Latex Swelling  . Pioglitazone Other (See  Comments)    REACTION: causes CHF and edema  . Norvasc (Amlodipine Besylate)   . Orudis (Ketoprofen)   . Pentazocine Lactate   . Ramipril   . Sertraline Hcl   . Benazepril Hcl     ? Possible lowers plateletes  . Indomethacin Other (See Comments)    Renal problems   . Minocycline Other (See Comments)    Makes dizzy and feel just lousy.    Patient Active Problem List  Diagnosis  . Sarcoidosis  . OBSTRUCTIVE SLEEP APNEA  . MYOCARDIAL INFARCTION  . CORONARY HEART DISEASE  . AORTIC STENOSIS  . VALVULAR HEART DISEASE  . DYSPNEA  . Hx of CABG  . Diabetes mellitus  . Gout  . BPH (benign prostatic hyperplasia)  . Benign hypertensive heart disease without heart failure  . Chronic kidney disease (CKD), stage III (moderate)  . Atrial fibrillation with controlled ventricular response  . Severe tricuspid regurgitation by prior echocardiogram  . S/P AVR (aortic valve replacement), Tricuspid Valve repair  . Atrial flutter  . Back pain    History  Smoking status  . Former Smoker  . Types: Cigarettes, Pipe  . Quit date: 12/25/1995  Smokeless tobacco  . Not on file    History  Alcohol Use No    Family History  Problem Relation Age of Onset  . Heart attack Father   . Allergies    . Asthma    . Heart disease    . Cancer      Review of Systems: Constitutional: no fever chills diaphoresis or fatigue or change in weight.  Head and neck: no hearing loss, no epistaxis, no photophobia or visual disturbance. Respiratory: No cough, shortness of breath or wheezing. Cardiovascular: No chest pain peripheral edema, palpitations. Gastrointestinal: No abdominal distention, no abdominal pain, no change in bowel habits hematochezia or melena. Genitourinary: No dysuria, no frequency, no urgency, no nocturia. Musculoskeletal:No arthralgias, no back pain, no gait disturbance or myalgias. Neurological: No dizziness, no headaches, no numbness, no seizures, no syncope, no weakness, no  tremors. Hematologic: No lymphadenopathy, no easy bruising. Psychiatric: No confusion, no hallucinations, no sleep disturbance.    Physical Exam: Filed Vitals:   03/17/13 1429  BP: 122/72  Pulse: 58   the general appearance reveals a large gentleman in no distress.The head and neck exam reveals pupils equal and reactive.  Extraocular movements are full.  There is no scleral icterus.  The mouth and pharynx are normal.  The neck is supple.  The carotids reveal no bruits.  The jugular venous pressure is normal.  The  thyroid  is not enlarged.  There is no lymphadenopathy.  The chest is clear to percussion and auscultation.  There are no rales or rhonchi.  Expansion of the chest is symmetrical.  The precordium is quiet.  The first heart sound is normal.  The second heart sound is physiologically split.  There is no gallop rub or click.  Soft systolic flow murmur across the prosthetic aortic valve.  No diastolic murmur. There is no abnormal lift or heave.  The abdomen is soft and nontender.  The bowel sounds are normal.  The liver and spleen are not enlarged.  There are no abdominal masses.  There are no abdominal bruits.  Extremities reveal good pedal pulses.  There is no phlebitis and there is trace edema. There is no cyanosis or clubbing.  Strength is normal and symmetrical in all extremities.  There is no lateralizing weakness.  There are no sensory deficits.  The skin is warm and dry.  There is no rash.  EKG shows atrial flutter with 41 AV conduction and a pattern of right bundle branch block and right axis deviation.   Assessment / Plan: Continue current medication.  The patient is tolerating his atrial flutter with 41 block without any worsening of cardiac symptoms.  Continue Xarelto to protect against systemic embolus. Recheck in 4 months for office visit and EKG

## 2013-03-17 NOTE — Patient Instructions (Addendum)
Your physician recommends that you continue on your current medications as directed. Please refer to the Current Medication list given to you today.  Your physician wants you to follow-up in: 4 month ov/ekg  You will receive a reminder letter in the mail two months in advance. If you don't receive a letter, please call our office to schedule the follow-up appointment.  

## 2013-03-17 NOTE — Assessment & Plan Note (Signed)
The patient is doing well following his aortic valve replacement.  He is not having any symptoms of congestive heart failure.  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

## 2013-03-17 NOTE — Assessment & Plan Note (Signed)
The patient has not been experiencing any chest pain or angina. 

## 2013-03-18 NOTE — Telephone Encounter (Signed)
Patient seen yesterday for office visit

## 2013-05-12 ENCOUNTER — Other Ambulatory Visit: Payer: Self-pay | Admitting: *Deleted

## 2013-05-12 DIAGNOSIS — I4892 Unspecified atrial flutter: Secondary | ICD-10-CM

## 2013-05-12 MED ORDER — RIVAROXABAN 15 MG PO TABS: 15.0000 mg | ORAL_TABLET | Freq: Every day | ORAL | Status: DC

## 2013-07-15 ENCOUNTER — Telehealth: Payer: Self-pay

## 2013-07-15 MED ORDER — AMLODIPINE BESYLATE 10 MG PO TABS: 10.0000 mg | ORAL_TABLET | Freq: Every day | ORAL | Status: DC

## 2013-07-15 NOTE — Telephone Encounter (Signed)
Refill request from [facility] for amLODIPine 10 mg once daily came in but when I tried to refill it showed up as an allergy. Please review and make decision on refill. Thanks.

## 2013-07-23 ENCOUNTER — Other Ambulatory Visit: Payer: Self-pay | Admitting: Cardiology

## 2013-07-29 ENCOUNTER — Other Ambulatory Visit: Payer: Self-pay

## 2013-09-02 ENCOUNTER — Encounter: Payer: Self-pay | Admitting: Cardiology

## 2013-09-02 ENCOUNTER — Ambulatory Visit (INDEPENDENT_AMBULATORY_CARE_PROVIDER_SITE_OTHER): Payer: Medicare Other | Admitting: Cardiology

## 2013-09-02 VITALS — BP 140/88 | HR 122 | Ht 71.0 in | Wt 260.0 lb

## 2013-09-02 DIAGNOSIS — I4892 Unspecified atrial flutter: Secondary | ICD-10-CM

## 2013-09-02 DIAGNOSIS — H269 Unspecified cataract: Secondary | ICD-10-CM | POA: Insufficient documentation

## 2013-09-02 DIAGNOSIS — M1990 Unspecified osteoarthritis, unspecified site: Secondary | ICD-10-CM | POA: Insufficient documentation

## 2013-09-02 NOTE — Progress Notes (Signed)
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 The patient has a history of known heart disease. He had coronary artery bypass graft surgery in 2006. 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 Postoperatively he had atrial fibrillation and was successfully cardioverted in August of 2012. Patient has history of renal insufficiency and is followed by nephrology. He has a history of arthritis and is followed by Dr. Kellie Simmering. His primary care physician is Dr. Foy Guadalajara. The patient has diabetes and is followed by Dr. Sharl Ma. Patient has a history of osteoarthritis and is followed by Dr. Charlett Blake. In August 2013 the patient was seen as a work in because of palpitations and was found to be back in atrial flutter fibrillation. At that time he was placed back on Xarelto 15 mg daily. He had an echocardiogram on 08/26/12 showing normal left ventricular systolic function with ejection fraction of 50-55%. He did have evidence of diastolic dysfunction with high left ventricular filling pressures. He had a large left atrium. He had only mild tricuspid regurgitation and no pulmonary hypertension. His prosthetic aortic valve appeared to be functioning normally. His EKG on 09/25/12 showed normal sinus rhythm. On his most recent previous visit on 12/31/12 his EKG shows that he is back in atrial flutter with a regular 4-1 ventricular response. Today his electrocardiogram is unchanged and shows stable atrial flutter with a 4-1 ventricular response.  The patient has not been experiencing any symptoms from his atrial flutter.  There've been no TIA or stroke symptoms he remains on Xarelto and reduced renal dose of 15 mg  daily.    Current Outpatient Prescriptions  Medication Sig Dispense Refill  . allopurinol (ZYLOPRIM) 100 MG tablet Take 200 mg by mouth daily.      . Alpha-Lipoic Acid 600 MG CAPS Take 600 mg by mouth daily. Taking 1200 bid      . amLODipine (NORVASC) 10 MG tablet Take 1 tablet (10 mg total) by mouth daily.  90 tablet  3  . aspirin EC 81 MG tablet Take 81 mg by mouth daily.      . diazepam (VALIUM) 10 MG tablet Take 10 mg by mouth every 6 (six) hours as needed.      . diclofenac sodium (VOLTAREN) 1 % GEL Apply topically as directed.      . Glucosamine-Chondroitin 750-600 MG TABS Take by mouth 2 (two) times daily. Taking 2 bid      . HYDROmorphone (DILAUDID) 2 MG tablet       . insulin lispro (HUMALOG) 100 UNIT/ML injection Inject into the skin 2 (two) times daily. Sliding scale      . lisinopril (PRINIVIL,ZESTRIL) 20 MG tablet Take 40 mg by mouth daily.       . Magnesium 400 MG CAPS Take by mouth as needed.       . metFORMIN (GLUCOPHAGE) 500 MG tablet Take 500 mg by mouth 2 (two) times daily with a meal.      . metoprolol tartrate (LOPRESSOR) 50 MG tablet Take 1 tablet (50 mg total) by mouth 2 (two) times daily.  180 tablet  3  . Multiple Vitamin (MULTIVITAMIN) tablet Take 1 tablet by mouth 2 (two)  times daily.      . Rivaroxaban (XARELTO) 15 MG TABS tablet Take 1 tablet (15 mg total) by mouth daily.  30 tablet  6  . terazosin (HYTRIN) 10 MG capsule Take 1 capsule (10 mg total) by mouth daily. Once a day  90 capsule  3  . TESTOSTERONE IM Inject 200 Units into the muscle. As directed      . traMADol (ULTRAM) 50 MG tablet Take 50 mg by mouth every 6 (six) hours as needed. 50 to 100 prn      . zolpidem (AMBIEN) 10 MG tablet Take 1 tablet (10 mg total) by mouth at bedtime as needed.  30 tablet  5   No current facility-administered medications for this visit.    Allergies  Allergen Reactions  . Actos [Pioglitazone Hydrochloride] Swelling  . Penicillins Anaphylaxis  . Amiodarone Other (See  Comments)    diarrhea  . Latex Swelling  . Pioglitazone Other (See Comments)    REACTION: causes CHF and edema  . Codeine     Makes Pt aggressive  . Orudis [Ketoprofen]   . Pentazocine Lactate   . Ramipril   . Sertraline Hcl   . Benazepril Hcl     ? Possible lowers plateletes  . Indomethacin Other (See Comments)    Renal problems   . Minocycline Other (See Comments)    Makes dizzy and feel just lousy.    Patient Active Problem List   Diagnosis Date Noted  . Atrial fibrillation with controlled ventricular response 07/05/2011    Priority: High  . BPH (benign prostatic hyperplasia) 04/26/2011    Priority: High  . AORTIC STENOSIS 11/24/2010    Priority: High  . Hx of CABG 04/26/2011    Priority: Medium  . Diabetes mellitus 04/26/2011    Priority: Medium  . Benign hypertensive heart disease without heart failure 04/26/2011    Priority: Medium  . Atrial flutter 08/20/2012  . Back pain 08/20/2012  . Severe tricuspid regurgitation by prior echocardiogram 08/14/2011  . S/P AVR (aortic valve replacement), Tricuspid Valve repair 08/14/2011  . Chronic kidney disease (CKD), stage III (moderate)   . Gout 04/26/2011  . MYOCARDIAL INFARCTION 11/27/2010  . CORONARY HEART DISEASE 11/27/2010  . VALVULAR HEART DISEASE 11/27/2010  . Sarcoidosis 11/24/2010  . OBSTRUCTIVE SLEEP APNEA 11/24/2010  . DYSPNEA 11/24/2010    History  Smoking status  . Former Smoker  . Types: Cigarettes, Pipe  . Quit date: 12/25/1995  Smokeless tobacco  . Not on file    History  Alcohol Use No    Family History  Problem Relation Age of Onset  . Heart attack Father   . Allergies    . Asthma    . Heart disease    . Cancer      Review of Systems: Constitutional: no fever chills diaphoresis or fatigue or change in weight.  Head and neck: no hearing loss, no epistaxis, no photophobia or visual disturbance. Respiratory: No cough, shortness of breath or wheezing. Cardiovascular: No chest pain  peripheral edema, palpitations. Gastrointestinal: No abdominal distention, no abdominal pain, no change in bowel habits hematochezia or melena. Genitourinary: No dysuria, no frequency, no urgency, no nocturia. Musculoskeletal:No arthralgias, no back pain, no gait disturbance or myalgias. Neurological: No dizziness, no headaches, no numbness, no seizures, no syncope, no weakness, no tremors. Hematologic: No lymphadenopathy, no easy bruising. Psychiatric: No confusion, no hallucinations, no sleep disturbance.    Physical Exam: Filed Vitals:   09/02/13 1546  BP: 140/88  Pulse: 122   the general appearance reveals a large gentleman in no distress his weight is down 2 pounds since last visit.  He is still significantly obese.The head and neck exam reveals pupils equal and reactive.  Extraocular movements are full.  There is no scleral icterus.  The mouth and pharynx are normal.  The neck is supple.  The carotids reveal no bruits.  The jugular venous pressure is normal.  The  thyroid is not enlarged.  There is no lymphadenopathy.  The chest is clear to percussion and auscultation.  There are no rales or rhonchi.  Expansion of the chest is symmetrical.  The precordium is quiet.  Pulse is irregular  The first heart sound is normal.  The second heart sound is physiologically split.  There is systolic flow murmur across his prosthetic aortic valve.  A diastolic murmur.l lift or heave.  The abdomen is soft and nontender.  The bowel sounds are normal.  The liver and spleen are not enlarged.  There are no abdominal masses.  There are no abdominal bruits.  Extremities reveal good pedal pulses.  There is no phlebitis or edema.  There is no cyanosis or clubbing.  Strength is normal and symmetrical in all extremities.  There is no lateralizing weakness.  There are no sensory deficits.  The skin is warm and dry.  There is no rash.    EKG shows atrial flutter with presumed 2-1 block.  He has underlying bifascicular  block.  Assessment / Plan: Continue same medication.  Consider elective cardioversion in October as per patient's schedule Recheck here in 4 months for office visit and EKG  He will be getting full lab work including lipid panel at his next nephrology visit  His wife is concerned about his bad dreams and nightmares and we are arranging for overnight oximetry .patient has sleep apnea

## 2013-09-02 NOTE — Assessment & Plan Note (Signed)
The patient has severe osteoarthritis of his right knee.  He has been under Dr. Eilleen Kempf care and has been receiving injections of reverse her Coumadin into his joint with minimal improvement.  He is looking into possible total knee replacement.  We talked about how we would hold his Xarelto for 48 hours prior to knee surgery.

## 2013-09-02 NOTE — Patient Instructions (Addendum)
Your physician recommends that you continue on your current medications as directed. Please refer to the Current Medication list given to you today.  Your physician wants you to follow-up in: 4 month ov/ekg  You will receive a reminder letter in the mail two months in advance. If you don't receive a letter, please call our office to schedule the follow-up appointment.  

## 2013-09-02 NOTE — Assessment & Plan Note (Signed)
With activity the patient's pulse jumps from 60-120 because he is in atrial flutter.  We talked about possible elective cardioversion.  He would like to consider at after the middle of October.  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 he will call me in mid October to discuss possible timing of elective cardioversion

## 2013-09-02 NOTE — Assessment & Plan Note (Signed)
The patient has a dense cataract in the left eye.  His ophthalmologist Dr. Dione Booze has recommended surgical treatment.  The patient has previously had a cataract removed from his right eye.  He recalls that the procedure was quite painful.

## 2013-09-23 ENCOUNTER — Telehealth: Payer: Self-pay | Admitting: Cardiology

## 2013-09-23 NOTE — Telephone Encounter (Signed)
New Problem  Pt states that he needs to speak about pulse-ox script.. Please call back to discuss (no further details)

## 2013-09-23 NOTE — Telephone Encounter (Signed)
Left message to call back  

## 2013-09-23 NOTE — Telephone Encounter (Signed)
[facility]  Phone number [phone] Need order fax order for overnight pulse oximetry sent over Will call tomorrow and get exactly what they need

## 2013-09-24 NOTE — Telephone Encounter (Signed)
Faxed order to [facility], called and confirmed received

## 2013-10-07 ENCOUNTER — Telehealth: Payer: Self-pay | Admitting: Cardiology

## 2013-10-07 NOTE — Telephone Encounter (Signed)
Cataract surgery November 3 and they are going to be going in through the skin to get to eye. Also, patient having a colonoscopy soon. With these he needed to know if he needed premed. Will forward to  Dr. Patty Sermons for review

## 2013-10-07 NOTE — Telephone Encounter (Signed)
New problem ° ° °Pt need you to call him.  °

## 2013-10-07 NOTE — Telephone Encounter (Signed)
According to the latest guidelines in [employer]pto[employer] neither of those procedures requires SBE prophylaxis.

## 2013-10-08 NOTE — Telephone Encounter (Signed)
Okay to wait on cardioversion

## 2013-10-08 NOTE — Telephone Encounter (Signed)
Patient advised.

## 2013-10-08 NOTE — Telephone Encounter (Signed)
Patient working on his PTSD and would like to wait until after eye surgery and coloscopy to schedule cardioversion. Will forward to  Dr. Patty Sermons so he is aware

## 2013-10-20 ENCOUNTER — Telehealth: Payer: Self-pay | Admitting: Cardiology

## 2013-10-20 NOTE — Telephone Encounter (Signed)
New problem   Pt need results of sleep study test.

## 2013-10-20 NOTE — Telephone Encounter (Signed)
[facility] tomorrow, seeing Dr Dione Booze Thursday, and cataract surgery on Monday. FYI  Will call [facility] for CPAP results on Thursday  Phone number [phone]

## 2013-10-22 NOTE — Telephone Encounter (Signed)
Requested study be faxed

## 2013-10-23 NOTE — Telephone Encounter (Signed)
Reviewed by  Dr. Patty Sermons and O2 sats above 90% except for one episode at 89% for 28 seconds. Advised patient

## 2013-10-23 NOTE — Telephone Encounter (Signed)
Cataract surgery 11/16/13

## 2013-11-10 ENCOUNTER — Encounter: Payer: Self-pay | Admitting: Cardiology

## 2013-11-12 ENCOUNTER — Telehealth: Payer: Self-pay | Admitting: Cardiology

## 2013-11-12 ENCOUNTER — Telehealth: Payer: Self-pay

## 2013-11-12 NOTE — Telephone Encounter (Signed)
Spoke with patient and clarified Xarelto at 15 mg daily

## 2013-11-12 NOTE — Telephone Encounter (Signed)
Pharm called about patient's xarelto i gave note to [facility]

## 2013-11-12 NOTE — Telephone Encounter (Signed)
New problem    Regarding dosage of xarelto.

## 2013-11-16 DIAGNOSIS — T8859XA Other complications of anesthesia, initial encounter: Secondary | ICD-10-CM

## 2013-11-16 HISTORY — PX: OTHER SURGICAL HISTORY: SHX169

## 2013-11-16 HISTORY — DX: Other complications of anesthesia, initial encounter: T88.59XA

## 2013-12-11 ENCOUNTER — Encounter (HOSPITAL_COMMUNITY): Payer: Self-pay | Admitting: Pharmacy Technician

## 2013-12-14 ENCOUNTER — Encounter (HOSPITAL_COMMUNITY): Payer: Self-pay | Admitting: *Deleted

## 2013-12-22 ENCOUNTER — Telehealth: Payer: Self-pay | Admitting: Cardiology

## 2013-12-22 DIAGNOSIS — I4891 Unspecified atrial fibrillation: Secondary | ICD-10-CM

## 2013-12-22 NOTE — Telephone Encounter (Signed)
Follow up    Dr Jimmey Ralph returned RN call. Please give him a call back.

## 2013-12-22 NOTE — Telephone Encounter (Signed)
Yes we should get an echo soon and see him in the next week or two for OV and discuss DCCV

## 2013-12-22 NOTE — Telephone Encounter (Signed)
New problem    Would like to discuss appt with nurse prior to  Dr. Foy Guadalajara appt @ 11:15 am today.

## 2013-12-22 NOTE — Telephone Encounter (Signed)
Advised patient and scheduled  

## 2013-12-22 NOTE — Telephone Encounter (Signed)
Patient thinks he has gone into Afib and that  Dr. Patty Sermons will probably want to do a cardioversion. If so, does he want an Echo prior and does he need to be seen. Going to see Dr Foy Guadalajara today and will get an EKG then to confirm. Will forward to  Dr. Patty Sermons for review

## 2013-12-22 NOTE — Telephone Encounter (Signed)
Patient confirmed in AFib. Dr Foy Guadalajara started Digoxin 0.25 mg today and then 1/4 of that daily. Gave him Rx for Dilaudid 10 mg at bedtime since Tramadol doesn't help, only gave him #10.

## 2013-12-24 HISTORY — PX: BASAL CELL CARCINOMA EXCISION: SHX1214

## 2013-12-28 ENCOUNTER — Encounter: Payer: Self-pay | Admitting: Cardiology

## 2013-12-28 ENCOUNTER — Ambulatory Visit (INDEPENDENT_AMBULATORY_CARE_PROVIDER_SITE_OTHER): Payer: Medicare Other | Admitting: Cardiology

## 2013-12-28 ENCOUNTER — Ambulatory Visit (HOSPITAL_COMMUNITY): Payer: Medicare Other | Attending: Cardiology | Admitting: Radiology

## 2013-12-28 VITALS — BP 140/78 | HR 41 | Ht 71.0 in | Wt 260.0 lb

## 2013-12-28 DIAGNOSIS — I38 Endocarditis, valve unspecified: Secondary | ICD-10-CM

## 2013-12-28 DIAGNOSIS — N183 Chronic kidney disease, stage 3 unspecified: Secondary | ICD-10-CM

## 2013-12-28 DIAGNOSIS — G4733 Obstructive sleep apnea (adult) (pediatric): Secondary | ICD-10-CM | POA: Insufficient documentation

## 2013-12-28 DIAGNOSIS — E119 Type 2 diabetes mellitus without complications: Secondary | ICD-10-CM | POA: Insufficient documentation

## 2013-12-28 DIAGNOSIS — I119 Hypertensive heart disease without heart failure: Secondary | ICD-10-CM

## 2013-12-28 DIAGNOSIS — I252 Old myocardial infarction: Secondary | ICD-10-CM | POA: Insufficient documentation

## 2013-12-28 DIAGNOSIS — Z952 Presence of prosthetic heart valve: Secondary | ICD-10-CM

## 2013-12-28 DIAGNOSIS — Z954 Presence of other heart-valve replacement: Secondary | ICD-10-CM

## 2013-12-28 DIAGNOSIS — I480 Paroxysmal atrial fibrillation: Secondary | ICD-10-CM

## 2013-12-28 DIAGNOSIS — N189 Chronic kidney disease, unspecified: Secondary | ICD-10-CM | POA: Insufficient documentation

## 2013-12-28 DIAGNOSIS — I4892 Unspecified atrial flutter: Secondary | ICD-10-CM | POA: Insufficient documentation

## 2013-12-28 DIAGNOSIS — I4891 Unspecified atrial fibrillation: Secondary | ICD-10-CM

## 2013-12-28 DIAGNOSIS — D869 Sarcoidosis, unspecified: Secondary | ICD-10-CM | POA: Insufficient documentation

## 2013-12-28 DIAGNOSIS — I079 Rheumatic tricuspid valve disease, unspecified: Secondary | ICD-10-CM | POA: Insufficient documentation

## 2013-12-28 DIAGNOSIS — I359 Nonrheumatic aortic valve disorder, unspecified: Secondary | ICD-10-CM | POA: Insufficient documentation

## 2013-12-28 LAB — CBC WITH DIFFERENTIAL/PLATELET
Basophils Absolute: 0 10*3/uL (ref 0.0–0.1)
Basophils Relative: 0.3 % (ref 0.0–3.0)
EOS PCT: 6.1 % — AB (ref 0.0–5.0)
Eosinophils Absolute: 0.4 10*3/uL (ref 0.0–0.7)
HEMATOCRIT: 42.5 % (ref 39.0–52.0)
Hemoglobin: 14.2 g/dL (ref 13.0–17.0)
LYMPHS ABS: 0.7 10*3/uL (ref 0.7–4.0)
Lymphocytes Relative: 12.9 % (ref 12.0–46.0)
MCHC: 33.4 g/dL (ref 30.0–36.0)
MCV: 92.8 fl (ref 78.0–100.0)
MONO ABS: 0.6 10*3/uL (ref 0.1–1.0)
Monocytes Relative: 10.8 % (ref 3.0–12.0)
Neutro Abs: 4 10*3/uL (ref 1.4–7.7)
Neutrophils Relative %: 69.9 % (ref 43.0–77.0)
Platelets: 155 10*3/uL (ref 150.0–400.0)
RBC: 4.57 Mil/uL (ref 4.22–5.81)
RDW: 15.1 % — ABNORMAL HIGH (ref 11.5–14.6)
WBC: 5.8 10*3/uL (ref 4.5–10.5)

## 2013-12-28 LAB — COMPREHENSIVE METABOLIC PANEL
ALK PHOS: 47 U/L (ref 39–117)
ALT: 19 U/L (ref 0–53)
AST: 19 U/L (ref 0–37)
Albumin: 3.7 g/dL (ref 3.5–5.2)
BILIRUBIN TOTAL: 0.5 mg/dL (ref 0.3–1.2)
BUN: 35 mg/dL — ABNORMAL HIGH (ref 6–23)
CO2: 24 mEq/L (ref 19–32)
Calcium: 9 mg/dL (ref 8.4–10.5)
Chloride: 106 mEq/L (ref 96–112)
Creatinine, Ser: 2.3 mg/dL — ABNORMAL HIGH (ref 0.4–1.5)
GFR: 30.13 mL/min — ABNORMAL LOW (ref >60.00)
GLUCOSE: 124 mg/dL — AB (ref 70–99)
Potassium: 5.3 mEq/L — ABNORMAL HIGH (ref 3.5–5.1)
Sodium: 135 mEq/L (ref 135–145)
Total Protein: 6.8 g/dL (ref 6.0–8.3)

## 2013-12-28 LAB — PROTIME-INR
INR: 1.4 ratio — ABNORMAL HIGH (ref 0.8–1.0)
PROTHROMBIN TIME: 15.1 s — AB (ref 10.2–12.4)

## 2013-12-28 LAB — APTT: aPTT: 34.4 s — ABNORMAL HIGH (ref 21.7–28.8)

## 2013-12-28 NOTE — Progress Notes (Signed)
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  He comes in because of recurrent atrial fibrillation.  He became aware of the atrial fibrillation on Christmas day 2014.  Since then he has had orthopnea and paroxysmal nocturnal dyspnea and exertional dyspnea. The patient has a history of known heart disease. He had coronary artery bypass graft surgery in 2006. 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 Postoperatively he had atrial fibrillation and was successfully cardioverted in August of 2012. Patient has history of renal insufficiency and is followed by nephrology. He has a history of arthritis and is followed by Dr. Charlestine Night. His primary care physician is Dr. Maceo Pro. The patient has diabetes and is followed by Dr. Buddy Duty. Patient has a history of osteoarthritis and is followed by Dr. [name]. In August 2013 the patient was seen as a work in because of palpitations and was found to be back in atrial flutter fibrillation. At that time he was placed back on Xarelto 15 mg daily. He had an echocardiogram on 08/26/12 showing normal left ventricular systolic function with ejection fraction of 50-55%. He did have evidence of diastolic dysfunction with high left ventricular filling pressures. He had a large left atrium. He had only mild tricuspid regurgitation and no pulmonary hypertension. His prosthetic aortic valve appeared to be functioning normally.  The patient has not missed any of the doses of Xarelto.  Since we last saw him his PCP has started low dose digoxin 0.0625 one daily for rate control.   Current Outpatient Prescriptions  Medication Sig Dispense Refill  . allopurinol (ZYLOPRIM) 100 MG tablet Take 200  mg by mouth daily with breakfast.       . amLODipine (NORVASC) 10 MG tablet Take 10 mg by mouth daily with breakfast.      . aspirin EC 81 MG tablet Take 81 mg by mouth daily.      . clonazePAM (KLONOPIN) 0.5 MG tablet Take 0.5-1 mg by mouth 2 (two) times daily. 1 tablet in the morning and two at night      . diazepam (VALIUM) 10 MG tablet Take 10 mg by mouth every 6 (six) hours as needed for anxiety or sleep.       [name] diclofenac sodium (VOLTAREN) 1 % GEL Apply topically daily as needed (pain).       [name] digoxin (LANOXIN) 0.125 MG tablet Take 0.5 tablets by mouth daily.      . Glucosamine-Chondroitin 750-600 MG TABS Take 1 tablet by mouth 2 (two) times daily. Taking 2 bid      . HYDROmorphone (DILAUDID) 2 MG tablet Take 2 mg by mouth every 4 (four) hours as needed for moderate pain or severe pain.       [name] insulin glargine (LANTUS) 100 UNIT/ML injection Inject 4-6 Units into the skin daily with breakfast.      . lisinopril (PRINIVIL,ZESTRIL) 40 MG tablet Take 40 mg by mouth daily with breakfast.      . Magnesium 400 MG CAPS Take 1 tablet by mouth as needed (constipation).       . metFORMIN (GLUCOPHAGE) 500 MG tablet Take 500 mg by mouth 2 (two) times daily with a meal.      .  metoprolol (LOPRESSOR) 50 MG tablet Take 50 mg by mouth 2 (two) times daily.      . Multiple Vitamin (MULTIVITAMIN) tablet Take 1 tablet by mouth daily.       . Rivaroxaban (XARELTO) 15 MG TABS tablet Take 15 mg by mouth daily with breakfast.      . terazosin (HYTRIN) 10 MG capsule Take 10 mg by mouth daily with breakfast.      . TESTOSTERONE IM Inject 200 Units into the muscle. As directed, twice a month      . traMADol (ULTRAM) 50 MG tablet Take 50-100 mg by mouth every 6 (six) hours as needed for moderate pain or severe pain. 50 to 100 prn      . zolpidem (AMBIEN) 10 MG tablet Take 10 mg by mouth at bedtime as needed for sleep.       No current facility-administered medications for this visit.    Allergies  Allergen  Reactions  . Actos [Pioglitazone Hydrochloride] Swelling  . Penicillins Other (See Comments)    Serum sickness, swelling  . Amiodarone Other (See Comments)    diarrhea  . Latex Swelling  . Pioglitazone Other (See Comments)    REACTION: causes CHF and edema  . Codeine     Makes Pt aggressive  . Orudis [Ketoprofen] Other (See Comments)    Cannot take due to renal insufficiency  . Pentazocine Lactate   . Sertraline Hcl     Pt not sure  . Benazepril Hcl     ? Possible lowers plateletes  . Indomethacin Other (See Comments)    Renal problems   . Minocycline Other (See Comments)    Makes dizzy and feel just lousy.    Patient Active Problem List   Diagnosis Date Noted  . Atrial fibrillation with controlled ventricular response 07/05/2011    Priority: High  . BPH (benign prostatic hyperplasia) 04/26/2011    Priority: High  . AORTIC STENOSIS 11/24/2010    Priority: High  . Hx of CABG 04/26/2011    Priority: Medium  . Diabetes mellitus 04/26/2011    Priority: Medium  . Benign hypertensive heart disease without heart failure 04/26/2011    Priority: Medium  . Cataract 09/02/2013  . Osteoarthritis 09/02/2013  . Atrial flutter 08/20/2012  . Back pain 08/20/2012  . Severe tricuspid regurgitation by prior echocardiogram 08/14/2011  . S/P AVR (aortic valve replacement), Tricuspid Valve repair 08/14/2011  . Chronic kidney disease (CKD), stage III (moderate)   . Gout 04/26/2011  . MYOCARDIAL INFARCTION 11/27/2010  . CORONARY HEART DISEASE 11/27/2010  . VALVULAR HEART DISEASE 11/27/2010  . Sarcoidosis 11/24/2010  . OBSTRUCTIVE SLEEP APNEA 11/24/2010  . DYSPNEA 11/24/2010    History  Smoking status  . Former Smoker -- 28 years  . Types: Pipe, Cigars  . Quit date: 12/25/1995  Smokeless tobacco  . Never Used    History  Alcohol Use  . Yes    Comment: 3-5 beer or wine per week    Family History  Problem Relation Age of Onset  . Heart attack Father   . Allergies    .  Asthma    . Heart disease    . Cancer      Review of Systems: Constitutional: no fever chills diaphoresis or fatigue or change in weight.  Head and neck: no hearing loss, no epistaxis, no photophobia or visual disturbance. Respiratory: No cough, shortness of breath or wheezing. Cardiovascular: No chest pain peripheral edema, palpitations. Gastrointestinal: No abdominal distention, no abdominal  pain, no change in bowel habits hematochezia or melena. Genitourinary: No dysuria, no frequency, no urgency, no nocturia. Musculoskeletal:No arthralgias, no back pain, no gait disturbance or myalgias. Neurological: No dizziness, no headaches, no numbness, no seizures, no syncope, no weakness, no tremors. Hematologic: No lymphadenopathy, no easy bruising. Psychiatric: No confusion, no hallucinations, no sleep disturbance.    Physical Exam: Filed Vitals:   12/28/13 1452  BP: 140/78  Pulse: 41   the general appearance reveals a large gentleman in no distress his weight is unchanged since last visit.  He is still significantly obese.The head and neck exam reveals pupils equal and reactive.  Extraocular movements are full.  There is no scleral icterus.  The mouth and pharynx are normal.  The neck is supple.  The carotids reveal no bruits.  The jugular venous pressure is normal.  The  thyroid is not enlarged.  There is no lymphadenopathy.  The chest is clear to percussion and auscultation.  There are no rales or rhonchi.  Expansion of the chest is symmetrical.  The precordium is quiet.  Pulse is irregular  The first heart sound is normal.  The second heart sound is physiologically split.  There is systolic flow murmur across his prosthetic aortic valve.  A diastolic murmur.l lift or heave.  The abdomen is soft and nontender.  The bowel sounds are normal.  The liver and spleen are not enlarged.  There are no abdominal masses.  There are no abdominal bruits.  Extremities reveal good pedal pulses.  There is no  phlebitis or edema.  There is no cyanosis or clubbing.  Strength is normal and symmetrical in all extremities.  There is no lateralizing weakness.  There are no sensory deficits.  The skin is warm and dry.  There is no rash.    EKG shows atrial fibrillation with slow ventricular response.  Assessment / Plan: We have scheduled elective cardioversion for July 7 at 11 AM.  Blood work today is pending.  He will hold all of his medicines on the morning of cardioversion.  He already has an appointment to return the following week for his previously scheduled visit and he will keep that appointment.

## 2013-12-28 NOTE — Progress Notes (Signed)
Echocardiogram performed.  

## 2013-12-28 NOTE — Assessment & Plan Note (Signed)
The patient is quite symptomatic from his atrial fibrillation with exertional dyspnea and 3 pillow orthopnea.  We will plan to proceed with outpatient direct current cardioversion on Wednesday, January 7.  He has not missed any of his doses of Xarelto.

## 2013-12-28 NOTE — Assessment & Plan Note (Signed)
Blood pressure was remaining stable on current therapy 

## 2013-12-28 NOTE — Assessment & Plan Note (Signed)
No new cardiac symptoms referable to his aortic valve disease.  His last echocardiogram 09/05/12 showed ejection fraction of 50-55%.  There was diastolic dysfunction.  There is left atrial enlargement.  There was good aortic prosthetic valve function.

## 2013-12-28 NOTE — Patient Instructions (Addendum)
Will obtain labs today and call you with the results (cmet/cbc/ptt/pt)  You are scheduled for a cardioversion on 12/30/13 at 11:00 with Dr. Mare Ferrari Please go to [facility] Short Stay at 9:30  Enter through the [place] not have any food or drink after midnight on 12/29/13 No medications the day of your procedure You will need someone to drive you home following your procedure.   Your physician recommends that you continue on your current medications as directed. Please refer to the Current Medication list given to you today.  Keep you 01/05/14 appointment with  Dr. Mare Ferrari

## 2013-12-28 NOTE — Progress Notes (Signed)
Quick Note:  Please report to patient. The recent labs are stable. Continue same medication and careful diet. Creatinine is higher. Potassium is higher. Avoid high potassium foods and beverages. Labs are satisfactory for DCCV Wednesday. ______

## 2013-12-29 ENCOUNTER — Telehealth: Payer: Self-pay | Admitting: *Deleted

## 2013-12-29 NOTE — Telephone Encounter (Signed)
Message copied by Earvin Hansen on Tue Dec 29, 2013  6:20 PM ------      Message from: [name]      Created: Mon Dec 28, 2013  8:50 PM       Please report.  The LV systolic function is still good. The systolic gradient across the aortic valve has increased since 2013 and is now moderate. Will continue to follow closely. Continue same meds. ------

## 2013-12-29 NOTE — Telephone Encounter (Signed)
Advised patient of lab and echo results 

## 2013-12-29 NOTE — Telephone Encounter (Signed)
Message copied by Earvin Hansen on Tue Dec 29, 2013  6:20 PM ------      Message from: [name]      Created: Mon Dec 28, 2013  8:44 PM       Please report to patient.  The recent labs are stable. Continue same medication and careful diet. Creatinine is higher. Potassium is higher.  Avoid high potassium foods and beverages.      Labs are satisfactory for DCCV Wednesday. ------

## 2013-12-30 ENCOUNTER — Ambulatory Visit (HOSPITAL_COMMUNITY): Payer: Medicare Other | Admitting: Anesthesiology

## 2013-12-30 ENCOUNTER — Ambulatory Visit (HOSPITAL_COMMUNITY): Admission: RE | Admit: 2013-12-30 | Payer: Medicare Other | Source: Ambulatory Visit | Admitting: Gastroenterology

## 2013-12-30 ENCOUNTER — Ambulatory Visit (HOSPITAL_COMMUNITY)
Admission: RE | Admit: 2013-12-30 | Discharge: 2013-12-30 | Disposition: A | Discharge status: Home / Self Care | Payer: Medicare Other | Source: Ambulatory Visit | Attending: Cardiology | Admitting: Cardiology

## 2013-12-30 ENCOUNTER — Encounter (HOSPITAL_COMMUNITY)
Admission: RE | Disposition: A | Discharge status: Home / Self Care | Payer: Self-pay | Source: Ambulatory Visit | Attending: Cardiology

## 2013-12-30 ENCOUNTER — Encounter (HOSPITAL_COMMUNITY): Payer: Self-pay

## 2013-12-30 ENCOUNTER — Encounter (HOSPITAL_COMMUNITY): Payer: Medicare Other | Admitting: Anesthesiology

## 2013-12-30 DIAGNOSIS — Z951 Presence of aortocoronary bypass graft: Secondary | ICD-10-CM | POA: Insufficient documentation

## 2013-12-30 DIAGNOSIS — N289 Disorder of kidney and ureter, unspecified: Secondary | ICD-10-CM | POA: Insufficient documentation

## 2013-12-30 DIAGNOSIS — I079 Rheumatic tricuspid valve disease, unspecified: Secondary | ICD-10-CM | POA: Insufficient documentation

## 2013-12-30 DIAGNOSIS — I4891 Unspecified atrial fibrillation: Secondary | ICD-10-CM

## 2013-12-30 DIAGNOSIS — Z7901 Long term (current) use of anticoagulants: Secondary | ICD-10-CM | POA: Insufficient documentation

## 2013-12-30 DIAGNOSIS — E119 Type 2 diabetes mellitus without complications: Secondary | ICD-10-CM | POA: Insufficient documentation

## 2013-12-30 DIAGNOSIS — M129 Arthropathy, unspecified: Secondary | ICD-10-CM | POA: Insufficient documentation

## 2013-12-30 DIAGNOSIS — Z952 Presence of prosthetic heart valve: Secondary | ICD-10-CM | POA: Insufficient documentation

## 2013-12-30 DIAGNOSIS — M1990 Unspecified osteoarthritis, unspecified site: Secondary | ICD-10-CM | POA: Insufficient documentation

## 2013-12-30 DIAGNOSIS — I4892 Unspecified atrial flutter: Secondary | ICD-10-CM | POA: Insufficient documentation

## 2013-12-30 HISTORY — PX: CARDIOVERSION: SHX1299

## 2013-12-30 HISTORY — DX: Heart failure, unspecified: I50.9

## 2013-12-30 HISTORY — DX: Adverse effect of unspecified anesthetic, initial encounter: T41.45XA

## 2013-12-30 LAB — GLUCOSE, CAPILLARY: Glucose-Capillary: 112 mg/dL — ABNORMAL HIGH (ref 70–99)

## 2013-12-30 SURGERY — CARDIOVERSION
Anesthesia: Monitor Anesthesia Care

## 2013-12-30 MED ORDER — LIDOCAINE HCL (CARDIAC) 20 MG/ML IV SOLN
INTRAVENOUS | Status: DC | PRN
  Administered 2013-12-30: 30 mg via INTRAVENOUS

## 2013-12-30 MED ORDER — SODIUM CHLORIDE 0.9 % IV SOLN
INTRAVENOUS | Status: DC | PRN
  Administered 2013-12-30: 11:00:00 via INTRAVENOUS

## 2013-12-30 MED ORDER — PROPOFOL 10 MG/ML IV BOLUS
INTRAVENOUS | Status: DC | PRN
  Administered 2013-12-30 (×2): 80 mg via INTRAVENOUS

## 2013-12-30 NOTE — Preoperative (Signed)
Beta Blockers   Reason not to administer Beta Blockers:Not Applicable 

## 2013-12-30 NOTE — Anesthesia Postprocedure Evaluation (Signed)
  Anesthesia Post-op Note  Patient: [name]  Procedure(s) Performed: Procedure(s): CARDIOVERSION (N/A)  Patient Location: Endoscopy Unit  Anesthesia Type:MAC  Level of Consciousness: awake, alert  and oriented  Airway and Oxygen Therapy: Patient Spontanous Breathing and Patient connected to nasal cannula oxygen  Post-op Pain: none  Post-op Assessment: Post-op Vital signs reviewed, Patient's Cardiovascular Status Stable, Respiratory Function Stable, No signs of Nausea or vomiting and Pain level controlled  Post-op Vital Signs: Reviewed and stable  Complications: No apparent anesthesia complications

## 2013-12-30 NOTE — CV Procedure (Signed)
Electrical Cardioversion Procedure Note [name] [id number] 12-18-1941  Procedure: Electrical Cardioversion Indications:  Atrial Fibrillation  Procedure Details Consent: Risks of procedure as well as the alternatives and risks of each were explained to the (patient/caregiver).  Consent for procedure obtained. Time Out: Verified patient identification, verified procedure, site/side was marked, verified correct patient position, special equipment/implants available, medications/allergies/relevent history reviewed, required imaging and test results available.  Performed  Patient placed on cardiac monitor, pulse oximetry, supplemental oxygen as necessary.  Sedation given: propofol 150 mg Pacer pads placed anterior and posterior chest.  Cardioverted 2 time(s).  Cardioverted at 150J.  Evaluation Findings: Post procedure EKG shows: NSR Complications: None Patient did tolerate procedure well.   [name] 12/30/2013, 10:59 AM

## 2013-12-30 NOTE — Transfer of Care (Signed)
Immediate Anesthesia Transfer of Care Note  Patient: [name]  Procedure(s) Performed: Procedure(s): CARDIOVERSION (N/A)  Patient Location: Endoscopy Unit  Anesthesia Type:MAC  Level of Consciousness: awake, alert  and oriented  Airway & Oxygen Therapy: Patient Spontanous Breathing and Patient connected to nasal cannula oxygen  Post-op Assessment: Report given to PACU RN and Post -op Vital signs reviewed and stable  Post vital signs: Reviewed and stable  Complications: No apparent anesthesia complications

## 2013-12-30 NOTE — H&P (Signed)
Patient returns for elective DCCV. 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 He comes in because of recurrent atrial fibrillation. He became aware of the atrial fibrillation on Christmas day 2014. Since then he has had orthopnea and paroxysmal nocturnal dyspnea and exertional dyspnea. The patient has a history of known heart disease. He had coronary artery bypass graft surgery in 2006. 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 Postoperatively he had atrial fibrillation and was successfully cardioverted in August of 2012. Patient has history of renal insufficiency and is followed by nephrology. He has a history of arthritis and is followed by Dr. Charlestine Night. His primary care physician is Dr. Maceo Pro. The patient has diabetes and is followed by Dr. Buddy Duty. Patient has a history of osteoarthritis and is followed by Dr. [name]. In August 2013 the patient was seen as a work in because of palpitations and was found to be back in atrial flutter fibrillation. At that time he was placed back on Xarelto 15 mg daily. He had an echocardiogram on 08/26/12 showing normal left ventricular systolic function with ejection fraction of 50-55%. He did have evidence of diastolic dysfunction with high left ventricular filling pressures. He had a large left atrium. He had only mild tricuspid regurgitation and no pulmonary hypertension. His prosthetic aortic valve appeared to be functioning normally. The patient has not missed any of the doses of Xarelto. Since we last saw him his PCP has started low dose digoxin 0.0625 one daily for rate control.

## 2013-12-30 NOTE — Anesthesia Preprocedure Evaluation (Addendum)
Anesthesia Evaluation  Patient identified by MRN, date of birth, ID band Patient awake    Reviewed: Allergy & Precautions, H&P , NPO status , Patient's Chart, lab work & pertinent test results, reviewed documented beta blocker date and time   History of Anesthesia Complications Negative for: history of anesthetic complications  Airway Mallampati: II TM Distance: >3 FB Neck ROM: Full    Dental  (+) Dental Advisory Given   Pulmonary shortness of breath, sleep apnea and Continuous Positive Airway Pressure Ventilation , former smoker,  breath sounds clear to auscultation  Pulmonary exam normal       Cardiovascular hypertension, Pt. on medications and Pt. on home beta blockers + CAD, + Cardiac Stents and + CABG + dysrhythmias (INR 1.4) Atrial Fibrillation + Valvular Problems/Murmurs (s/p AVR and TVR) AS Rhythm:Irregular Rate:Normal     Neuro/Psych    GI/Hepatic Neg liver ROS,   Endo/Other  diabetes, Insulin Dependent and Oral Hypoglycemic AgentsMorbid obesity  Renal/GU Renal InsufficiencyRenal disease (creat 2.3, K+ 5.3)     Musculoskeletal   Abdominal (+) + obese,   Peds  Hematology   Anesthesia Other Findings   Reproductive/Obstetrics                       Anesthesia Physical Anesthesia Plan  ASA: III  Anesthesia Plan: MAC   Post-op Pain Management:    Induction:   Airway Management Planned: Natural Airway and Mask  Additional Equipment:   Intra-op Plan:   Post-operative Plan:   Informed Consent: I have reviewed the patients History and Physical, chart, labs and discussed the procedure including the risks, benefits and alternatives for the proposed anesthesia with the patient or authorized representative who has indicated his/her understanding and acceptance.   Dental advisory given  Plan Discussed with: CRNA and Surgeon  Anesthesia Plan Comments: (Plan routine monitors, GA for  cardioversion)      Anesthesia Quick Evaluation

## 2013-12-31 ENCOUNTER — Encounter (HOSPITAL_COMMUNITY): Payer: Self-pay | Admitting: Cardiology

## 2013-12-31 SURGERY — COLONOSCOPY WITH PROPOFOL
Anesthesia: Monitor Anesthesia Care

## 2014-01-05 ENCOUNTER — Encounter: Payer: Self-pay | Admitting: Cardiology

## 2014-01-05 ENCOUNTER — Telehealth: Payer: Self-pay | Admitting: Cardiology

## 2014-01-05 ENCOUNTER — Ambulatory Visit (INDEPENDENT_AMBULATORY_CARE_PROVIDER_SITE_OTHER): Payer: Medicare Other | Admitting: Cardiology

## 2014-01-05 VITALS — BP 150/70 | HR 50 | Ht 72.0 in | Wt 250.0 lb

## 2014-01-05 DIAGNOSIS — Z954 Presence of other heart-valve replacement: Secondary | ICD-10-CM

## 2014-01-05 DIAGNOSIS — N183 Chronic kidney disease, stage 3 unspecified: Secondary | ICD-10-CM

## 2014-01-05 DIAGNOSIS — M1990 Unspecified osteoarthritis, unspecified site: Secondary | ICD-10-CM

## 2014-01-05 DIAGNOSIS — I480 Paroxysmal atrial fibrillation: Secondary | ICD-10-CM

## 2014-01-05 DIAGNOSIS — Z952 Presence of prosthetic heart valve: Secondary | ICD-10-CM

## 2014-01-05 DIAGNOSIS — I4891 Unspecified atrial fibrillation: Secondary | ICD-10-CM

## 2014-01-05 NOTE — Telephone Encounter (Signed)
Discussed with  Dr. Mare Ferrari and will wait until after follow up ov end of the month.

## 2014-01-05 NOTE — Telephone Encounter (Signed)
New problem   Need to know status of pt holding his Xarelto for his colonoscopy on 01/29/14. Please advise

## 2014-01-05 NOTE — Assessment & Plan Note (Signed)
The patient has severe pain in the right knee which is chronic.  He anticipates eventual right total knee replacement.  He has not yet seen Dr.[name].  Dr. Jacqulyn Bath is his regular orthopedist.

## 2014-01-05 NOTE — Assessment & Plan Note (Signed)
The patient is avoiding diuretics because of his rising BUN and creatinine.  He has a appointment pending with his nephrologist

## 2014-01-05 NOTE — Patient Instructions (Signed)
Your physician recommends that you continue on your current medications as directed. Please refer to the Current Medication list given to you today.  Keep your appointment later this month

## 2014-01-05 NOTE — Assessment & Plan Note (Signed)
EKG today confirms sinus bradycardia at 50 per minute with first degree AV block and right bundle branch block

## 2014-01-05 NOTE — Progress Notes (Signed)
[name] Date of Birth:  08-Aug-1941 [address] 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  He underwent successful direct current cardioversion on 12/30/2013.    He became aware of the atrial fibrillation on Christmas day 2014.  Since then he has had orthopnea and paroxysmal nocturnal dyspnea and exertional dyspnea. The patient has a history of known heart disease. He had coronary artery bypass graft surgery in 2006. 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 Postoperatively he had atrial fibrillation and was successfully cardioverted in August of 2012. Patient has history of renal insufficiency and is followed by nephrology. He has a history of arthritis and is followed by Dr. Charlestine Night. His primary care physician is Dr. Maceo Pro. The patient has diabetes and is followed by Dr. Buddy Duty. Patient has a history of osteoarthritis and is followed by Dr. [name]. In August 2013 the patient was seen as a work in because of palpitations and was found to be back in atrial flutter fibrillation. At that time he was placed back on Xarelto 15 mg daily. He had an echocardiogram on 08/26/12 showing normal left ventricular systolic function with ejection fraction of 50-55%. He did have evidence of diastolic dysfunction with high left ventricular filling pressures. He had a large left atrium. He had only mild tricuspid regurgitation and no pulmonary hypertension. His prosthetic aortic valve appeared to be functioning normally.  The patient has not missed any of the doses of Xarelto.  Since his cardioversion he has remained in normal sinus rhythm clinically.  Current Outpatient Prescriptions  Medication Sig Dispense Refill  . allopurinol (ZYLOPRIM) 100 MG tablet Take 200 mg by  mouth daily with breakfast.       . amLODipine (NORVASC) 10 MG tablet Take 10 mg by mouth daily with breakfast.      . aspirin EC 81 MG tablet Take 81 mg by mouth daily.      . clonazePAM (KLONOPIN) 0.5 MG tablet Take 0.5-1 mg by mouth 2 (two) times daily. 1 tablet in the morning and two at night      . diazepam (VALIUM) 10 MG tablet Take 10 mg by mouth every 6 (six) hours as needed for anxiety or sleep.       [name] diclofenac sodium (VOLTAREN) 1 % GEL Apply topically daily as needed (pain).       [name] digoxin (LANOXIN) 0.125 MG tablet Take 0.5 tablets by mouth daily.      . Glucosamine-Chondroitin 750-600 MG TABS Take 1 tablet by mouth 2 (two) times daily. Taking 2 bid      . HYDROmorphone (DILAUDID) 2 MG tablet Take 2 mg by mouth every 4 (four) hours as needed for moderate pain or severe pain.       [name] insulin glargine (LANTUS) 100 UNIT/ML injection Inject 4-6 Units into the skin daily with breakfast.      . lisinopril (PRINIVIL,ZESTRIL) 40 MG tablet Take 40 mg by mouth daily with breakfast.      . Magnesium 400 MG CAPS Take 1 tablet by mouth as needed (constipation).       . metFORMIN (GLUCOPHAGE) 500 MG tablet Take 500 mg by mouth 2 (two) times daily with a meal.      . metoprolol (  LOPRESSOR) 50 MG tablet Take 50 mg by mouth 2 (two) times daily.      . Multiple Vitamin (MULTIVITAMIN) tablet Take 1 tablet by mouth daily.       . Rivaroxaban (XARELTO) 15 MG TABS tablet Take 15 mg by mouth daily with breakfast.      . terazosin (HYTRIN) 10 MG capsule Take 10 mg by mouth daily with breakfast.      . TESTOSTERONE IM Inject 200 Units into the muscle. As directed, twice a month      . traMADol (ULTRAM) 50 MG tablet Take 50-100 mg by mouth every 6 (six) hours as needed for moderate pain or severe pain. 50 to 100 prn      . zolpidem (AMBIEN) 10 MG tablet Take 10 mg by mouth at bedtime as needed for sleep.       No current facility-administered medications for this visit.    Allergies  Allergen Reactions    . Actos [Pioglitazone Hydrochloride] Swelling  . Penicillins Other (See Comments)    Serum sickness, swelling  . Amiodarone Other (See Comments)    diarrhea  . Latex Swelling  . Pioglitazone Other (See Comments)    REACTION: causes CHF and edema  . Codeine     Makes Pt aggressive  . Orudis [Ketoprofen] Other (See Comments)    Cannot take due to renal insufficiency  . Pentazocine Lactate   . Sertraline Hcl     Pt not sure  . Benazepril Hcl     ? Possible lowers plateletes  . Indomethacin Other (See Comments)    Renal problems   . Minocycline Other (See Comments)    Makes dizzy and feel just lousy.    Patient Active Problem List   Diagnosis Date Noted  . Atrial fibrillation with controlled ventricular response 07/05/2011    Priority: High  . BPH (benign prostatic hyperplasia) 04/26/2011    Priority: High  . AORTIC STENOSIS 11/24/2010    Priority: High  . Hx of CABG 04/26/2011    Priority: Medium  . Diabetes mellitus 04/26/2011    Priority: Medium  . Benign hypertensive heart disease without heart failure 04/26/2011    Priority: Medium  . Cataract 09/02/2013  . Osteoarthritis 09/02/2013  . Atrial flutter 08/20/2012  . Back pain 08/20/2012  . Severe tricuspid regurgitation by prior echocardiogram 08/14/2011  . S/P AVR (aortic valve replacement), Tricuspid Valve repair 08/14/2011  . Chronic kidney disease (CKD), stage III (moderate)   . Gout 04/26/2011  . MYOCARDIAL INFARCTION 11/27/2010  . CORONARY HEART DISEASE 11/27/2010  . VALVULAR HEART DISEASE 11/27/2010  . Sarcoidosis 11/24/2010  . OBSTRUCTIVE SLEEP APNEA 11/24/2010  . DYSPNEA 11/24/2010    History  Smoking status  . Former Smoker -- 28 years  . Types: Pipe, Cigars  . Quit date: 12/25/1995  Smokeless tobacco  . Never Used    History  Alcohol Use  . Yes    Comment: 3-5 beer or wine per week    Family History  Problem Relation Age of Onset  . Heart attack Father   . Allergies    . Asthma     . Heart disease    . Cancer      Review of Systems: Constitutional: no fever chills diaphoresis or fatigue or change in weight.  Head and neck: no hearing loss, no epistaxis, no photophobia or visual disturbance. Respiratory: No cough, shortness of breath or wheezing. Cardiovascular: No chest pain peripheral edema, palpitations. Gastrointestinal: No abdominal distention, no abdominal  pain, no change in bowel habits hematochezia or melena. Genitourinary: No dysuria, no frequency, no urgency, no nocturia. Musculoskeletal:No arthralgias, no back pain, no gait disturbance or myalgias. Neurological: No dizziness, no headaches, no numbness, no seizures, no syncope, no weakness, no tremors. Hematologic: No lymphadenopathy, no easy bruising. Psychiatric: No confusion, no hallucinations, no sleep disturbance.    Physical Exam: Filed Vitals:   01/05/14 1629  BP: 150/70  Pulse: 50   the general appearance reveals a large gentleman in no distress his weight is unchanged since last visit.  He is still significantly obese.The head and neck exam reveals pupils equal and reactive.  Extraocular movements are full.  There is no scleral icterus.  The mouth and pharynx are normal.  The neck is supple.  The carotids reveal no bruits.  The jugular venous pressure is normal.  The  thyroid is not enlarged.  There is no lymphadenopathy.  The chest is clear to percussion and auscultation.  There are no rales or rhonchi.  Expansion of the chest is symmetrical.  The precordium is quiet.  Pulse is irregular  The first heart sound is normal.  The second heart sound is physiologically split.  There is systolic flow murmur across his prosthetic aortic valve.  A diastolic murmur.l lift or heave.  The abdomen is soft and nontender.  The bowel sounds are normal.  The liver and spleen are not enlarged.  There are no abdominal masses.  There are no abdominal bruits.  Extremities reveal good pedal pulses.  There is no phlebitis  or edema.  There is no cyanosis or clubbing.  Strength is normal and symmetrical in all extremities.  There is no lateralizing weakness.  There are no sensory deficits.  The skin is warm and dry.  There is no rash.    EKG shows sinus bradycardia with first degree  Assessment / Plan:  The patient will continue same medication.  He has an appointment for [place] to check them again at the end of January.  At that point we will discuss handling of his anticoagulation prior to anticipated colonoscopy to be done in the hospital in early February.  He is on Xarelto.

## 2014-01-06 NOTE — Telephone Encounter (Signed)
[name] would call her back after patients appointment at the end of the month.

## 2014-01-08 ENCOUNTER — Encounter (HOSPITAL_COMMUNITY): Payer: Self-pay | Admitting: Pharmacy Technician

## 2014-01-10 ENCOUNTER — Other Ambulatory Visit: Payer: Self-pay | Admitting: Cardiology

## 2014-01-13 ENCOUNTER — Other Ambulatory Visit: Payer: Self-pay | Admitting: Orthopedic Surgery

## 2014-01-13 DIAGNOSIS — M5410 Radiculopathy, site unspecified: Secondary | ICD-10-CM

## 2014-01-14 ENCOUNTER — Ambulatory Visit
Admission: RE | Admit: 2014-01-14 | Discharge: 2014-01-14 | Disposition: A | Discharge status: Home / Self Care | Payer: Medicare Other | Source: Ambulatory Visit | Attending: Orthopedic Surgery | Admitting: Orthopedic Surgery

## 2014-01-14 DIAGNOSIS — M5410 Radiculopathy, site unspecified: Secondary | ICD-10-CM

## 2014-01-19 ENCOUNTER — Other Ambulatory Visit: Payer: Self-pay | Admitting: Orthopedic Surgery

## 2014-01-19 ENCOUNTER — Encounter (HOSPITAL_COMMUNITY): Payer: Self-pay | Admitting: *Deleted

## 2014-01-19 DIAGNOSIS — M549 Dorsalgia, unspecified: Secondary | ICD-10-CM

## 2014-01-19 NOTE — Telephone Encounter (Signed)
Will forward to  Dr. Brackbill for review 

## 2014-01-19 NOTE — Telephone Encounter (Signed)
New problem    Need to know if pt can have Xarelto stop for 24 hrs for injection. Please call

## 2014-01-20 ENCOUNTER — Encounter: Payer: Self-pay | Admitting: Cardiology

## 2014-01-20 ENCOUNTER — Ambulatory Visit (INDEPENDENT_AMBULATORY_CARE_PROVIDER_SITE_OTHER): Payer: Medicare Other | Admitting: Cardiology

## 2014-01-20 VITALS — BP 154/40 | HR 35 | Ht 72.0 in | Wt 276.0 lb

## 2014-01-20 DIAGNOSIS — Z954 Presence of other heart-valve replacement: Secondary | ICD-10-CM

## 2014-01-20 DIAGNOSIS — I480 Paroxysmal atrial fibrillation: Secondary | ICD-10-CM

## 2014-01-20 DIAGNOSIS — N183 Chronic kidney disease, stage 3 unspecified: Secondary | ICD-10-CM

## 2014-01-20 DIAGNOSIS — M549 Dorsalgia, unspecified: Secondary | ICD-10-CM

## 2014-01-20 DIAGNOSIS — Z952 Presence of prosthetic heart valve: Secondary | ICD-10-CM

## 2014-01-20 DIAGNOSIS — I4891 Unspecified atrial fibrillation: Secondary | ICD-10-CM

## 2014-01-20 NOTE — Progress Notes (Signed)
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  He underwent successful direct current cardioversion on 12/30/2013.    He became aware of the atrial fibrillation on Christmas day 2014.  Since then he has had orthopnea and paroxysmal nocturnal dyspnea and exertional dyspnea. The patient has a history of known heart disease. He had coronary artery bypass graft surgery in 2006. 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 Postoperatively he had atrial fibrillation and was successfully cardioverted in August of 2012. Patient has history of renal insufficiency and is followed by nephrology. He has a history of arthritis and is followed by Dr. Charlestine Night. His primary care physician is Dr. Maceo Pro. The patient has diabetes and is followed by Dr. Buddy Duty. Patient has a history of osteoarthritis and is followed by Dr. [name]. In August 2013 the patient was seen as a work in because of palpitations and was found to be back in atrial flutter fibrillation. At that time he was placed back on Xarelto 15 mg daily. He had an echocardiogram on 08/26/12 showing normal left ventricular systolic function with ejection fraction of 50-55%. He did have evidence of diastolic dysfunction with high left ventricular filling pressures. He had a large left atrium. He had only mild tricuspid regurgitation and no pulmonary hypertension. His prosthetic aortic valve appeared to be functioning normally.  The patient has not missed any of the doses of Xarelto.  Since his cardioversion he has remained in normal sinus rhythm. He has been having chronic sciatic pain and will be undergoing a sciatic nerve block by interventional radiology in the near future.  The patient  also is scheduled for a colonoscopy with Dr. Oletta Lamas on Friday, February 6. Since last visit the patient has not been experiencing any chest pain.  He has just finished a 12 day course of prednisone Dosepak and has gained considerable weight.  Weight is up to 276 pounds and he does have peripheral edema.  He has now finished the Dosepak and anticipates that the edema will be subsiding.  Current Outpatient Prescriptions  Medication Sig Dispense Refill  . allopurinol (ZYLOPRIM) 100 MG tablet Take 200 mg by mouth daily with breakfast.       . amLODipine (NORVASC) 10 MG tablet Take 10 mg by mouth daily with breakfast.      . aspirin EC 81 MG tablet Take 81 mg by mouth at bedtime.       . clonazePAM (KLONOPIN) 0.5 MG tablet Take 0.5-1 mg by mouth 2 (two) times daily. 1 tablet in the morning and two at night      . diazepam (VALIUM) 10 MG tablet Take 10 mg by mouth every 6 (six) hours as needed for anxiety or sleep.       [name] diclofenac sodium (VOLTAREN) 1 % GEL Apply topically daily as needed (pain).       . Glucosamine-Chondroitin 750-600 MG TABS Take 1 tablet by mouth 2 (two) times daily.       [name] HYDROmorphone (DILAUDID) 2 MG tablet Take 2-4 mg by mouth every 4 (four) hours as needed for moderate pain or severe pain.       [name] insulin glargine (LANTUS) 100 UNIT/ML injection  Inject 3-5 Units into the skin daily with breakfast.       . lisinopril (PRINIVIL,ZESTRIL) 40 MG tablet Take 40 mg by mouth daily with breakfast.      . Magnesium 400 MG CAPS Take 1 tablet by mouth as needed (constipation).       . metFORMIN (GLUCOPHAGE) 500 MG tablet Take 500 mg by mouth 2 (two) times daily with a meal.      . metoprolol (LOPRESSOR) 50 MG tablet TAKE 1 TABLET BY MOUTH TWICE A DAY  180 tablet  0  . Multiple Vitamin (MULTIVITAMIN) tablet Take 1 tablet by mouth daily.       . Rivaroxaban (XARELTO) 15 MG TABS tablet Take 15 mg by mouth daily with breakfast.      . terazosin (HYTRIN) 10 MG capsule Take 10 mg by mouth  daily with breakfast.      . TESTOSTERONE IM Inject 200 Units into the muscle. As directed, twice a month      . traMADol (ULTRAM) 50 MG tablet Take 50-100 mg by mouth every 6 (six) hours as needed for moderate pain or severe pain. 50 to 100 prn      . zolpidem (AMBIEN) 10 MG tablet Take 10 mg by mouth at bedtime as needed for sleep.       No current facility-administered medications for this visit.    Allergies  Allergen Reactions  . Actos [Pioglitazone Hydrochloride] Swelling  . Penicillins Other (See Comments)    Serum sickness, swelling  . Amiodarone Other (See Comments)    diarrhea  . Latex Swelling  . Pioglitazone Other (See Comments)    REACTION: causes CHF and edema  . Codeine     Makes Pt aggressive  . Orudis [Ketoprofen] Other (See Comments)    Cannot take due to renal insufficiency  . Pentazocine Lactate   . Sertraline Hcl     Pt not sure  . Benazepril Hcl     ? Possible lowers plateletes  . Indomethacin Other (See Comments)    Renal problems   . Minocycline Other (See Comments)    Makes dizzy and feel just lousy.    Patient Active Problem List   Diagnosis Date Noted  . Atrial fibrillation with controlled ventricular response 07/05/2011    Priority: High  . BPH (benign prostatic hyperplasia) 04/26/2011    Priority: High  . AORTIC STENOSIS 11/24/2010    Priority: High  . Hx of CABG 04/26/2011    Priority: Medium  . Diabetes mellitus 04/26/2011    Priority: Medium  . Benign hypertensive heart disease without heart failure 04/26/2011    Priority: Medium  . Cataract 09/02/2013  . Osteoarthritis 09/02/2013  . Atrial flutter 08/20/2012  . Back pain 08/20/2012  . Severe tricuspid regurgitation by prior echocardiogram 08/14/2011  . S/P AVR (aortic valve replacement), Tricuspid Valve repair 08/14/2011  . Chronic kidney disease (CKD), stage III (moderate)   . Gout 04/26/2011  . MYOCARDIAL INFARCTION 11/27/2010  . CORONARY HEART DISEASE 11/27/2010  . VALVULAR  HEART DISEASE 11/27/2010  . Sarcoidosis 11/24/2010  . OBSTRUCTIVE SLEEP APNEA 11/24/2010  . DYSPNEA 11/24/2010    History  Smoking status  . Former Smoker -- 28 years  . Types: Pipe, Cigars  . Quit date: 12/25/1995  Smokeless tobacco  . Never Used    History  Alcohol Use  . Yes    Comment: 3-5 beer or wine per week    Family History  Problem Relation Age of Onset  .  Heart attack Father   . Allergies    . Asthma    . Heart disease    . Cancer      Review of Systems: Constitutional: no fever chills diaphoresis or fatigue or change in weight.  Head and neck: no hearing loss, no epistaxis, no photophobia or visual disturbance. Respiratory: No cough, shortness of breath or wheezing. Cardiovascular: No chest pain peripheral edema, palpitations. Gastrointestinal: No abdominal distention, no abdominal pain, no change in bowel habits hematochezia or melena. Genitourinary: No dysuria, no frequency, no urgency, no nocturia. Musculoskeletal:No arthralgias, no back pain, no gait disturbance or myalgias. Neurological: No dizziness, no headaches, no numbness, no seizures, no syncope, no weakness, no tremors. Hematologic: No lymphadenopathy, no easy bruising. Psychiatric: No confusion, no hallucinations, no sleep disturbance.    Physical Exam: Filed Vitals:   01/20/14 1606  BP: 154/40  Pulse: 35   the general appearance reveals a large gentleman in no distress his weight is unchanged since last visit.  He is still significantly obese.The head and neck exam reveals pupils equal and reactive.  Extraocular movements are full.  There is no scleral icterus.  The mouth and pharynx are normal.  The neck is supple.  The carotids reveal no bruits.  The jugular venous pressure is normal.  The  thyroid is not enlarged.  There is no lymphadenopathy.  The chest is clear to percussion and auscultation.  There are no rales or rhonchi.  Expansion of the chest is symmetrical.  The precordium is  quiet.  Pulse is irregular  The first heart sound is normal.  The second heart sound is physiologically split.  There is systolic flow murmur across his prosthetic aortic valve.  A diastolic murmur.l lift or heave.  The abdomen is soft and nontender.  The bowel sounds are normal.  The liver and spleen are not enlarged.  There are no abdominal masses.  There are no abdominal bruits.  Extremities reveal good pedal pulses.  There is 2+ pretibial edema. There is no cyanosis or clubbing.  Strength is normal and symmetrical in all extremities.  There is no lateralizing weakness.  There are no sensory deficits.  The skin is warm and dry.  There is no rash.    EKG shows marked sinus bradycardia at 36 per minute  Assessment / Plan:  Stop digoxin The heart rate remains low he will also reduce metoprolol to 25 mg twice a day. He will take his last dose of Xarelto on Wednesday, February 4 prior to his colonoscopy on February 6. I will defer to Dr. Oletta Lamas concerning the need for SBE prophylaxis for his bioprosthetic valve for routine colonoscopy. The patient will return here in 2 months for followup office visit and EKG. He will try to work harder on diet and weight loss and salt restriction.

## 2014-01-20 NOTE — Telephone Encounter (Signed)
Discuss at office visit today

## 2014-01-20 NOTE — Assessment & Plan Note (Signed)
The patient has a bioprosthetic aortic valve.  The valve is functioning in a satisfactory manner.

## 2014-01-20 NOTE — Assessment & Plan Note (Signed)
The patient will be scheduling his sciatic nerve block.  He will need to hold his Xarelto for 2 days prior to the day of procedure itself.

## 2014-01-20 NOTE — Assessment & Plan Note (Signed)
The patient is remaining in normal sinus rhythm.  However he is in extreme sinus bradycardia at a heart rate 36 per minute.  At this point we will stop his digoxin altogether.  He will monitor his heart rate at home.  If his heart rate remains slow he will subsequently decrease his Toprol to 25 mg twice a day.

## 2014-01-20 NOTE — Patient Instructions (Addendum)
STOP DIGOXIN   HOLD YOUR XARELTO  DAY BEFORE AND DAY OF UPCOMING PROCEDURES.  Your physician recommends that you schedule a follow-up appointment in: 2 MONTHS/EKG  Discuss SBE with Dr Oletta Lamas

## 2014-01-21 ENCOUNTER — Inpatient Hospital Stay: Admission: RE | Admit: 2014-01-21 | Payer: Medicare Other | Source: Ambulatory Visit

## 2014-01-21 NOTE — Telephone Encounter (Signed)
Left message with Andee Poles ok to hold and faxed info to [facility]

## 2014-01-22 ENCOUNTER — Ambulatory Visit
Admission: RE | Admit: 2014-01-22 | Discharge: 2014-01-22 | Disposition: A | Discharge status: Home / Self Care | Payer: Medicare Other | Source: Ambulatory Visit | Attending: Orthopedic Surgery | Admitting: Orthopedic Surgery

## 2014-01-22 ENCOUNTER — Telehealth: Payer: Self-pay | Admitting: Cardiology

## 2014-01-22 VITALS — BP 158/56 | HR 32

## 2014-01-22 DIAGNOSIS — M549 Dorsalgia, unspecified: Secondary | ICD-10-CM

## 2014-01-22 MED ORDER — IOHEXOL 180 MG/ML  SOLN
1.0000 mL | Freq: Once | INTRAMUSCULAR | Status: AC | PRN
  Administered 2014-01-22: 1 mL via EPIDURAL

## 2014-01-22 MED ORDER — METHYLPREDNISOLONE ACETATE 40 MG/ML INJ SUSP (RADIOLOG
120.0000 mg | Freq: Once | INTRAMUSCULAR | Status: AC
  Administered 2014-01-22: 120 mg via EPIDURAL

## 2014-01-22 NOTE — Telephone Encounter (Signed)
Received request from Nurse fax box, documents faxed for surgical clearance. °To: [facility] °Fax number: [phone] °Attention: °[phone]/kdm °

## 2014-01-22 NOTE — Discharge Instructions (Signed)

## 2014-01-27 ENCOUNTER — Other Ambulatory Visit: Payer: Self-pay | Admitting: Gastroenterology

## 2014-01-27 NOTE — Addendum Note (Signed)
Addended by: Vernie Ammons. on: 01/27/2014 09:41 AM   Modules accepted: Orders

## 2014-01-29 ENCOUNTER — Encounter (HOSPITAL_COMMUNITY): Payer: Self-pay

## 2014-01-29 ENCOUNTER — Ambulatory Visit (HOSPITAL_COMMUNITY)
Admission: RE | Admit: 2014-01-29 | Discharge: 2014-01-29 | Disposition: A | Discharge status: Home / Self Care | Payer: Medicare Other | Source: Ambulatory Visit | Attending: Gastroenterology | Admitting: Gastroenterology

## 2014-01-29 ENCOUNTER — Ambulatory Visit (HOSPITAL_COMMUNITY): Payer: Medicare Other | Admitting: Anesthesiology

## 2014-01-29 ENCOUNTER — Encounter (HOSPITAL_COMMUNITY): Payer: Medicare Other | Admitting: Anesthesiology

## 2014-01-29 ENCOUNTER — Encounter (HOSPITAL_COMMUNITY)
Admission: RE | Disposition: A | Discharge status: Home / Self Care | Payer: Self-pay | Source: Ambulatory Visit | Attending: Gastroenterology

## 2014-01-29 DIAGNOSIS — G4730 Sleep apnea, unspecified: Secondary | ICD-10-CM | POA: Insufficient documentation

## 2014-01-29 DIAGNOSIS — Z1211 Encounter for screening for malignant neoplasm of colon: Secondary | ICD-10-CM | POA: Insufficient documentation

## 2014-01-29 DIAGNOSIS — Z87891 Personal history of nicotine dependence: Secondary | ICD-10-CM | POA: Insufficient documentation

## 2014-01-29 DIAGNOSIS — D126 Benign neoplasm of colon, unspecified: Secondary | ICD-10-CM | POA: Insufficient documentation

## 2014-01-29 DIAGNOSIS — D869 Sarcoidosis, unspecified: Secondary | ICD-10-CM | POA: Insufficient documentation

## 2014-01-29 DIAGNOSIS — I2510 Atherosclerotic heart disease of native coronary artery without angina pectoris: Secondary | ICD-10-CM | POA: Insufficient documentation

## 2014-01-29 DIAGNOSIS — I10 Essential (primary) hypertension: Secondary | ICD-10-CM | POA: Insufficient documentation

## 2014-01-29 DIAGNOSIS — I509 Heart failure, unspecified: Secondary | ICD-10-CM | POA: Insufficient documentation

## 2014-01-29 DIAGNOSIS — Z951 Presence of aortocoronary bypass graft: Secondary | ICD-10-CM | POA: Insufficient documentation

## 2014-01-29 DIAGNOSIS — I252 Old myocardial infarction: Secondary | ICD-10-CM | POA: Insufficient documentation

## 2014-01-29 DIAGNOSIS — Z800 Family history of malignant neoplasm of digestive organs: Secondary | ICD-10-CM | POA: Insufficient documentation

## 2014-01-29 DIAGNOSIS — I4891 Unspecified atrial fibrillation: Secondary | ICD-10-CM | POA: Insufficient documentation

## 2014-01-29 HISTORY — DX: Sciatica, right side: M54.31

## 2014-01-29 HISTORY — PX: COLONOSCOPY: SHX5424

## 2014-01-29 LAB — GLUCOSE, CAPILLARY: Glucose-Capillary: 122 mg/dL — ABNORMAL HIGH (ref 70–99)

## 2014-01-29 SURGERY — COLONOSCOPY
Anesthesia: Monitor Anesthesia Care

## 2014-01-29 MED ORDER — LACTATED RINGERS IV SOLN
INTRAVENOUS | Status: DC | PRN
  Administered 2014-01-29: 10:00:00 via INTRAVENOUS

## 2014-01-29 MED ORDER — KETAMINE HCL 10 MG/ML IJ SOLN
INTRAMUSCULAR | Status: DC | PRN
  Administered 2014-01-29: 20 mg via INTRAVENOUS

## 2014-01-29 MED ORDER — PROPOFOL INFUSION 10 MG/ML OPTIME
INTRAVENOUS | Status: DC | PRN
  Administered 2014-01-29: 100 ug/kg/min via INTRAVENOUS

## 2014-01-29 MED ORDER — PROPOFOL 10 MG/ML IV BOLUS
INTRAVENOUS | Status: AC
  Filled 2014-01-29: qty 20

## 2014-01-29 MED ORDER — PROPOFOL 10 MG/ML IV EMUL
INTRAVENOUS | Status: DC | PRN
  Administered 2014-01-29 (×2): 30 mg via INTRAVENOUS

## 2014-01-29 MED ORDER — SODIUM CHLORIDE 0.9 % IV SOLN: INTRAVENOUS | Status: DC

## 2014-01-29 MED ORDER — LIDOCAINE HCL (CARDIAC) 20 MG/ML IV SOLN
INTRAVENOUS | Status: AC
  Filled 2014-01-29: qty 5

## 2014-01-29 MED ORDER — KETAMINE HCL 10 MG/ML IJ SOLN
INTRAMUSCULAR | Status: AC
  Filled 2014-01-29: qty 1

## 2014-01-29 MED ORDER — EPHEDRINE SULFATE 50 MG/ML IJ SOLN
INTRAMUSCULAR | Status: AC
  Filled 2014-01-29: qty 1

## 2014-01-29 MED ORDER — SODIUM CHLORIDE 0.9 % IJ SOLN
INTRAMUSCULAR | Status: AC
  Filled 2014-01-29: qty 10

## 2014-01-29 NOTE — Preoperative (Signed)
Beta Blockers   Reason not to administer Beta Blockers:Not Applicable 

## 2014-01-29 NOTE — Anesthesia Postprocedure Evaluation (Signed)
  Anesthesia Post-op Note  Patient: [name]  Procedure(s) Performed: Procedure(s) (LRB): COLONOSCOPY (N/A)  Patient Location: PACU  Anesthesia Type: MAC  Level of Consciousness: awake and alert   Airway and Oxygen Therapy: Patient Spontanous Breathing  Post-op Pain: mild  Post-op Assessment: Post-op Vital signs reviewed, Patient's Cardiovascular Status Stable, Respiratory Function Stable, Patent Airway and No signs of Nausea or vomiting  Last Vitals:  Filed Vitals:   01/29/14 1130  BP: 172/63  Temp:   Resp: 17    Post-op Vital Signs: stable   Complications: No apparent anesthesia complications

## 2014-01-29 NOTE — Transfer of Care (Signed)
Immediate Anesthesia Transfer of Care Note  Patient: [name]  Procedure(s) Performed: Procedure(s) with comments: COLONOSCOPY (N/A) - amanda//ja  Patient Location: PACU  Anesthesia Type:MAC  Level of Consciousness: awake, alert  and oriented  Airway & Oxygen Therapy: Patient Spontanous Breathing and Patient connected to face mask oxygen  Post-op Assessment: Report given to PACU RN and Post -op Vital signs reviewed and stable  Post vital signs: Reviewed and stable  Complications: No apparent anesthesia complications

## 2014-01-29 NOTE — Op Note (Signed)
[facility] [place], [address]   COLONOSCOPY PROCEDURE REPORT  PATIENT: [name], [name]  MR#: [id number] BIRTHDATE: 05-19-1941 , 72  yrs. old GENDER: Male ENDOSCOPIST: Laurence Spates, MD REFERRED BY:   Dr. Maceo Pro. Cardiologist: Dr. Mare Ferrari PROCEDURE DATE:  01/29/2014 PROCEDURE:    Colonoscopy with Polypectomy ASA CLASS:    class III INDICATIONS:  strong family history of colon cancer. Son died of metastatic colon cancer in his late 12s MEDICATIONS:  MAC propofol anesthesia  DESCRIPTION OF PROCEDURE: The Pentax adult: the scope inserted and advanced. Prep adequate. Able to advance to the cecum without difficulty. No tumor masses, diverticulosis throughout the entire:. 1/2 cm sessile polyp in the proximal descending: removed with the hot snare and recovered. Moderate internal hemorrhoids seen in the rectum and the retroflex view. No other significant findings. The patient was monitored by anesthesia throughout and there were no immediate complications.     COMPLICATIONS: None  ENDOSCOPIC IMPRESSION: 1. Sessile Polyp Proximal Descending colon. Removed 2. Strong family history: cancer.  RECOMMENDATIONS: will resume anticoagulants in 3 days. Will need repeat colonoscopy in 5 years if clinically appropriate.    _______________________________ eSigned:  Laurence Spates, MD 01/29/2014 10:55 AM  CC: Dr. Maceo Pro, Dr. Mare Ferrari

## 2014-01-29 NOTE — Discharge Instructions (Addendum)
No aspirin, ibuprofen or other NSAID medications for 5 days. Office will send note or call when pathology results are obtained. Colonoscopy will be repeated based on the pathology results.  Hold Xarelto for  Another 3 days then resume.Colonoscopy Care After These instructions give you information on caring for yourself after your procedure. Your doctor may also give you more specific instructions. Call your doctor if you have any problems or questions after your procedure. HOME CARE  Take it easy for the next 24 hours.  Rest.  Walk or use warm packs on your belly (abdomen) if you have belly cramping or gas.  Do not drive for 24 hours.  You may shower.  Do not sign important papers or use machinery for 24 hours.  Drink enough fluids to keep your pee (urine) clear or pale yellow.  Resume your normal diet. Avoid heavy or fried foods.  Avoid alcohol.  Continue taking your normal medicines.  Only take medicine as told by your doctor. Do not take aspirin. If you had growths (polyps) removed:  Do not take aspirin.  Do not drink alcohol for 7 days or as told by your doctor.  Eat a soft diet for 24 hours. GET HELP RIGHT AWAY IF:  You have a fever.  You pass clumps of tissue (blood clots) or fill the toilet with blood.  You have belly pain that gets worse and medicine does not help.  Your belly is puffy (swollen).  You feel sick to your stomach (nauseous) or throw up (vomit). MAKE SURE YOU:  Understand these instructions.  Will watch your condition.  Will get help right away if you are not doing well or get worse. Document Released: 01/12/2011 Document Revised: 03/03/2012 Document Reviewed: 08/17/2013 [facility] Patient Information 2014 [place].

## 2014-01-29 NOTE — Anesthesia Preprocedure Evaluation (Addendum)
Anesthesia Evaluation  Patient identified by MRN, date of birth, ID band Patient awake    Reviewed: Allergy & Precautions, H&P , NPO status , Patient's Chart, lab work & pertinent test results  Airway Mallampati: III TM Distance: >3 FB Neck ROM: full    Dental no notable dental hx. (+) Teeth Intact and Dental Advisory Given   Pulmonary neg pulmonary ROS, shortness of breath, sleep apnea and Continuous Positive Airway Pressure Ventilation , former smoker,  sarcoidosis breath sounds clear to auscultation  Pulmonary exam normal       Cardiovascular Exercise Tolerance: Poor hypertension, Pt. on medications + CAD, + Past MI, + CABG and +CHF + dysrhythmias Atrial Fibrillation Rhythm:regular Rate:Normal     Neuro/Psych negative neurological ROS  negative psych ROS   GI/Hepatic negative GI ROS, Neg liver ROS,   Endo/Other  negative endocrine ROSdiabetes, Well Controlled, Type 2, Insulin DependentMorbid obesity  Renal/GU negative Renal ROS  negative genitourinary   Musculoskeletal   Abdominal (+) + obese,   Peds  Hematology negative hematology ROS (+)   Anesthesia Other Findings   Reproductive/Obstetrics negative OB ROS                         Anesthesia Physical Anesthesia Plan  ASA: IV  Anesthesia Plan: MAC   Post-op Pain Management:    Induction:   Airway Management Planned: Nasal Cannula  Additional Equipment:   Intra-op Plan:   Post-operative Plan:   Informed Consent: I have reviewed the patients History and Physical, chart, labs and discussed the procedure including the risks, benefits and alternatives for the proposed anesthesia with the patient or authorized representative who has indicated his/her understanding and acceptance.   Dental Advisory Given  Plan Discussed with: CRNA and Surgeon  Anesthesia Plan Comments:         Anesthesia Quick Evaluation

## 2014-01-29 NOTE — H&P (Signed)
Subjective:   Patient is a 73 y.o. male presents with need for colonoscopy. His son died a metastatic colon cancer. The patient is considering a knee replacement and wishes to know that he does not have colon cancer prior to proceeding. He has other problems of CAD, AVR, in sleep apnea. Procedure including risks and benefits discussed in office.  Patient Active Problem List   Diagnosis Date Noted  . Cataract 09/02/2013  . Osteoarthritis 09/02/2013  . Atrial flutter 08/20/2012  . Back pain 08/20/2012  . Severe tricuspid regurgitation by prior echocardiogram 08/14/2011  . S/P AVR (aortic valve replacement), Tricuspid Valve repair 08/14/2011  . Atrial fibrillation with controlled ventricular response 07/05/2011  . Chronic kidney disease (CKD), stage III (moderate)   . Hx of CABG 04/26/2011  . Diabetes mellitus 04/26/2011  . Gout 04/26/2011  . BPH (benign prostatic hyperplasia) 04/26/2011  . Benign hypertensive heart disease without heart failure 04/26/2011  . MYOCARDIAL INFARCTION 11/27/2010  . CORONARY HEART DISEASE 11/27/2010  . VALVULAR HEART DISEASE 11/27/2010  . Sarcoidosis 11/24/2010  . OBSTRUCTIVE SLEEP APNEA 11/24/2010  . AORTIC STENOSIS 11/24/2010  . DYSPNEA 11/24/2010   Past Medical History  Diagnosis Date  . Coronary heart disease     stent, CABG, RBBB  . Valvular heart disease     aortic stenosis/regurgitation  . Sarcoid 2011    pulmonary and bone marrow  . Hypercalcemia 2011    due to sarcoidiosis  . Bone marrow disease   . Diabetes mellitus   . Hemorrhagic cystitis 2012  . Gout   . HTN (hypertension)   . A-fib     controlled with medication  . Myocardial infarction     1995, 2002, 2006  . OSA (obstructive sleep apnea)     use bipap setting of 10 and 12  . CHF (congestive heart failure)   . Complication of anesthesia 11-16-13    required alot of versed per anesthesia with cataract surgery  . Chronic kidney disease (CKD), stage III (moderate)     lov  note dr Koleen Nimrod nephrology 09-17-13 on chart  . Right sciatic nerve pain     for block thursday 01-21-2014    Past Surgical History  Procedure Laterality Date  . Cataract surgery Right 2007  . Prostate surgery  2012    turp  . Vasectomy  yrs ago  . Redo median sternotomy, extracorporeal cirulation, avr, tricuspid valve repair  06/13/2011    AVR(23-mm Maybelle Depaoli pericardial Magna-Ease valve./ TVrepair (34-mm Jamarious Febo MC3 annuloplasty ring  . Coronary artery bypass graft  2006    x 5  . Cataract surgery Left 11-24-104  . Cardioversion N/A 12/30/2013    Procedure: CARDIOVERSION;  Surgeon: [name], MD;  Location: [facility] ENDOSCOPY;  Service: Cardiovascular;  Laterality: N/A;  10:49 cardioversion at 120 joules, then 150 joules, to SB  used  Lido 30mg ,  Propofol 160 mcg    Prescriptions prior to admission  Medication Sig Dispense Refill  . allopurinol (ZYLOPRIM) 100 MG tablet Take 200 mg by mouth daily with breakfast.       . amLODipine (NORVASC) 10 MG tablet Take 10 mg by mouth daily with breakfast.      . aspirin EC 81 MG tablet Take 81 mg by mouth at bedtime.       . clonazePAM (KLONOPIN) 0.5 MG tablet Take 0.5-1 mg by mouth 2 (two) times daily. 1 tablet in the morning and two at night      . diclofenac sodium (VOLTAREN) 1 %  GEL Apply topically daily as needed (pain).       . Glucosamine-Chondroitin 750-600 MG TABS Take 1 tablet by mouth 2 (two) times daily.       [name] HYDROmorphone (DILAUDID) 2 MG tablet Take 2-4 mg by mouth every 4 (four) hours as needed for moderate pain or severe pain.       [name] insulin glargine (LANTUS) 100 UNIT/ML injection Inject 3-5 Units into the skin daily with breakfast.       . lisinopril (PRINIVIL,ZESTRIL) 40 MG tablet Take 40 mg by mouth daily with breakfast.      . metFORMIN (GLUCOPHAGE) 500 MG tablet Take 500 mg by mouth 2 (two) times daily with a meal.      . metoprolol (LOPRESSOR) 50 MG tablet TAKE 1 TABLET BY MOUTH TWICE A DAY  180 tablet  0  . Multiple  Vitamin (MULTIVITAMIN) tablet Take 1 tablet by mouth daily.       . Rivaroxaban (XARELTO) 15 MG TABS tablet Take 15 mg by mouth daily with breakfast.      . terazosin (HYTRIN) 10 MG capsule Take 10 mg by mouth daily with breakfast.      . TESTOSTERONE IM Inject 200 Units into the muscle. As directed, twice a month      . traMADol (ULTRAM) 50 MG tablet Take 50-100 mg by mouth every 6 (six) hours as needed for moderate pain or severe pain. 50 to 100 prn      . zolpidem (AMBIEN) 10 MG tablet Take 10 mg by mouth at bedtime as needed for sleep.      . diazepam (VALIUM) 10 MG tablet Take 10 mg by mouth every 6 (six) hours as needed for anxiety or sleep.       . Magnesium 400 MG CAPS Take 1 tablet by mouth as needed (constipation).        Allergies  Allergen Reactions  . Actos [Pioglitazone Hydrochloride] Swelling  . Penicillins Other (See Comments)    Serum sickness, swelling  . Amiodarone Other (See Comments)    diarrhea  . Latex Swelling  . Pioglitazone Other (See Comments)    REACTION: causes CHF and edema  . Codeine     Makes Pt aggressive  . Orudis [Ketoprofen] Other (See Comments)    Cannot take due to renal insufficiency  . Pentazocine Lactate   . Sertraline Hcl     Pt not sure  . Benazepril Hcl     ? Possible lowers plateletes  . Indomethacin Other (See Comments)    Renal problems   . Minocycline Other (See Comments)    Makes dizzy and feel just lousy.    History  Substance Use Topics  . Smoking status: Former Smoker -- 28 years    Types: Pipe, Cigars    Quit date: 12/25/1995  . Smokeless tobacco: Never Used  . Alcohol Use: Yes     Comment: 3-5 beer or wine per week    Family History  Problem Relation Age of Onset  . Heart attack Father   . Allergies    . Asthma    . Heart disease    . Cancer       Objective:   Patient Vitals for the past 8 hrs:  Temp Temp src  01/29/14 0929 98 F (36.7 C) Oral         See MD Preop evaluation      Assessment:    1. Strong family history of colon cancer and primary relative  Plan:   Will proceed with colonoscopy today. We will use propofol anesthesia due to his history of sleep apnea. He has received oral antibiotic prior to coming to the hospital. The procedures been discussed in detail with him in the office.

## 2014-02-01 ENCOUNTER — Encounter (HOSPITAL_COMMUNITY): Payer: Self-pay | Admitting: Gastroenterology

## 2014-02-10 ENCOUNTER — Other Ambulatory Visit: Payer: Self-pay | Admitting: Orthopedic Surgery

## 2014-02-10 DIAGNOSIS — M545 Low back pain, unspecified: Secondary | ICD-10-CM

## 2014-02-11 ENCOUNTER — Other Ambulatory Visit: Payer: Medicare Other

## 2014-02-15 ENCOUNTER — Ambulatory Visit
Admission: RE | Admit: 2014-02-15 | Discharge: 2014-02-15 | Disposition: A | Discharge status: Home / Self Care | Payer: Medicare Other | Source: Ambulatory Visit | Attending: Orthopedic Surgery | Admitting: Orthopedic Surgery

## 2014-02-15 DIAGNOSIS — M545 Low back pain, unspecified: Secondary | ICD-10-CM

## 2014-02-15 MED ORDER — METHYLPREDNISOLONE ACETATE 40 MG/ML INJ SUSP (RADIOLOG
120.0000 mg | Freq: Once | INTRAMUSCULAR | Status: AC
  Administered 2014-02-15: 120 mg via EPIDURAL

## 2014-02-15 MED ORDER — IOHEXOL 180 MG/ML  SOLN
1.0000 mL | Freq: Once | INTRAMUSCULAR | Status: AC | PRN
  Administered 2014-02-15: 1 mL via EPIDURAL

## 2014-02-15 NOTE — Discharge Instructions (Signed)

## 2014-02-25 ENCOUNTER — Other Ambulatory Visit: Payer: Self-pay | Admitting: Cardiology

## 2014-03-15 ENCOUNTER — Other Ambulatory Visit: Payer: Self-pay | Admitting: Cardiology

## 2014-03-19 ENCOUNTER — Ambulatory Visit (INDEPENDENT_AMBULATORY_CARE_PROVIDER_SITE_OTHER): Payer: Medicare Other | Admitting: Cardiology

## 2014-03-19 ENCOUNTER — Encounter: Payer: Self-pay | Admitting: Cardiology

## 2014-03-19 VITALS — BP 181/66 | HR 64 | Ht 72.0 in | Wt 267.0 lb

## 2014-03-19 DIAGNOSIS — I2510 Atherosclerotic heart disease of native coronary artery without angina pectoris: Secondary | ICD-10-CM

## 2014-03-19 DIAGNOSIS — M1990 Unspecified osteoarthritis, unspecified site: Secondary | ICD-10-CM

## 2014-03-19 DIAGNOSIS — I4891 Unspecified atrial fibrillation: Secondary | ICD-10-CM

## 2014-03-19 DIAGNOSIS — N183 Chronic kidney disease, stage 3 unspecified: Secondary | ICD-10-CM

## 2014-03-19 DIAGNOSIS — Z952 Presence of prosthetic heart valve: Secondary | ICD-10-CM

## 2014-03-19 DIAGNOSIS — I480 Paroxysmal atrial fibrillation: Secondary | ICD-10-CM

## 2014-03-19 DIAGNOSIS — Z954 Presence of other heart-valve replacement: Secondary | ICD-10-CM

## 2014-03-19 MED ORDER — RIVAROXABAN 15 MG PO TABS: ORAL_TABLET | ORAL | Status: DC

## 2014-03-19 NOTE — Assessment & Plan Note (Signed)
The patient is not having symptoms of congestive heart failure.  He has been participating in water aerobics whenever his right knee allows him.

## 2014-03-19 NOTE — Assessment & Plan Note (Signed)
The patient is maintaining normal sinus rhythm.  EKG today confirms.

## 2014-03-19 NOTE — Assessment & Plan Note (Signed)
The patient has not been experiencing any chest pain or angina. 

## 2014-03-19 NOTE — Progress Notes (Signed)
[name] Date of Birth:  [birth year] [address] 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  He underwent successful direct current cardioversion most recently on 12/30/2013.    He became aware of the atrial fibrillation on Christmas day 2014.  Since then he has had orthopnea and paroxysmal nocturnal dyspnea and exertional dyspnea. The patient has a history of known heart disease. He had coronary artery bypass graft surgery in 2006. 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 Postoperatively he had atrial fibrillation and was successfully cardioverted in August of 2012. Patient has history of renal insufficiency and is followed by nephrology. He has a history of arthritis and is followed by Dr. Charlestine Night. His primary care physician is Dr. Maceo Pro. The patient has diabetes and is followed by Dr. Buddy Duty. Patient has a history of osteoarthritis and is followed by Dr. [name].  He has also seen Dr. [name] and is scheduled for right total knee replacement on June 1. . He had an echocardiogram on 08/26/12 showing normal left ventricular systolic function with ejection fraction of 50-55%. He did have evidence of diastolic dysfunction with high left ventricular filling pressures. He had a large left atrium. He had only mild tricuspid regurgitation and no pulmonary hypertension. His prosthetic aortic valve appeared to be functioning normally.   Since we last saw him he had successful colonoscopy by Dr. Oletta Lamas on February 6. The patient has been having some symptoms of anxiety and Dr. Casimiro Needle is following him.  He is also seeing a psychologist. The patient had recent laboratory studies at nephrology visit last week so we did not draw any blood work on him today here. Current  Outpatient Prescriptions  Medication Sig Dispense Refill  . allopurinol (ZYLOPRIM) 100 MG tablet Take 200 mg by mouth daily with breakfast.       . amLODipine (NORVASC) 10 MG tablet Take 10 mg by mouth daily with breakfast.      . aspirin EC 81 MG tablet Take 81 mg by mouth at bedtime.       . clonazePAM (KLONOPIN) 0.5 MG tablet Take 0.5-1 mg by mouth 2 (two) times daily. 1 tablet in the morning and two at night      . diazepam (VALIUM) 10 MG tablet Take 10 mg by mouth every 6 (six) hours as needed for anxiety or sleep.       [name] diclofenac sodium (VOLTAREN) 1 % GEL Apply topically daily as needed (pain).       . Glucosamine-Chondroitin 750-600 MG TABS Take 1 tablet by mouth 2 (two) times daily.       [name] HYDROmorphone (DILAUDID) 2 MG tablet Take 2-4 mg by mouth every 4 (four) hours as needed for moderate pain or severe pain.       [name] insulin glargine (LANTUS) 100 UNIT/ML injection Inject 3-5 Units into the skin daily with breakfast.       . lisinopril (PRINIVIL,ZESTRIL) 40 MG tablet Take 40 mg by mouth daily with breakfast.      . Magnesium 400 MG CAPS Take 1 tablet by mouth as needed (constipation).       . metFORMIN (GLUCOPHAGE) 500 MG tablet Take 500 mg by mouth 2 (two) times daily with a meal.      .  metoprolol (LOPRESSOR) 50 MG tablet TAKE 1 TABLET BY MOUTH TWICE A DAY  180 tablet  0  . Multiple Vitamin (MULTIVITAMIN) tablet Take 1 tablet by mouth daily.       . Rivaroxaban (XARELTO) 15 MG TABS tablet TAKE 1 TABLET BY MOUTH EVERY DAY  30 tablet  5  . terazosin (HYTRIN) 10 MG capsule Take 10 mg by mouth daily with breakfast.      . TESTOSTERONE IM Inject 200 Units into the muscle. As directed, twice a month      . traMADol (ULTRAM) 50 MG tablet Take 50-100 mg by mouth every 6 (six) hours as needed for moderate pain or severe pain. 50 to 100 prn      . zolpidem (AMBIEN) 10 MG tablet Take 10 mg by mouth at bedtime as needed for sleep.       No current facility-administered medications for this  visit.    Allergies  Allergen Reactions  . Actos [Pioglitazone Hydrochloride] Swelling  . Penicillins Other (See Comments)    Serum sickness, swelling  . Amiodarone Other (See Comments)    diarrhea  . Latex Swelling  . Pioglitazone Other (See Comments)    REACTION: causes CHF and edema  . Codeine     Makes Pt aggressive  . Orudis [Ketoprofen] Other (See Comments)    Cannot take due to renal insufficiency  . Pentazocine Lactate   . Sertraline Hcl     Pt not sure  . Benazepril Hcl     ? Possible lowers plateletes  . Indomethacin Other (See Comments)    Renal problems   . Minocycline Other (See Comments)    Makes dizzy and feel just lousy.    Patient Active Problem List   Diagnosis Date Noted  . Atrial fibrillation with controlled ventricular response 07/05/2011    Priority: High  . BPH (benign prostatic hyperplasia) 04/26/2011    Priority: High  . AORTIC STENOSIS 11/24/2010    Priority: High  . Hx of CABG 04/26/2011    Priority: Medium  . Diabetes mellitus 04/26/2011    Priority: Medium  . Benign hypertensive heart disease without heart failure 04/26/2011    Priority: Medium  . Cataract 09/02/2013  . Osteoarthritis 09/02/2013  . Atrial flutter 08/20/2012  . Back pain 08/20/2012  . Severe tricuspid regurgitation by prior echocardiogram 08/14/2011  . S/P AVR (aortic valve replacement), Tricuspid Valve repair 08/14/2011  . Chronic kidney disease (CKD), stage III (moderate)   . Gout 04/26/2011  . MYOCARDIAL INFARCTION 11/27/2010  . CORONARY HEART DISEASE 11/27/2010  . VALVULAR HEART DISEASE 11/27/2010  . Sarcoidosis 11/24/2010  . OBSTRUCTIVE SLEEP APNEA 11/24/2010  . DYSPNEA 11/24/2010    History  Smoking status  . Former Smoker -- 28 years  . Types: Pipe, Cigars  . Quit date: 12/25/1995  Smokeless tobacco  . Never Used    History  Alcohol Use  . Yes    Comment: 3-5 beer or wine per week    Family History  Problem Relation Age of Onset  . Heart  attack Father   . Allergies    . Asthma    . Heart disease    . Cancer      Review of Systems: Constitutional: no fever chills diaphoresis or fatigue or change in weight.  Head and neck: no hearing loss, no epistaxis, no photophobia or visual disturbance. Respiratory: No cough, shortness of breath or wheezing. Cardiovascular: No chest pain peripheral edema, palpitations. Gastrointestinal: No abdominal distention, no abdominal  pain, no change in bowel habits hematochezia or melena. Genitourinary: No dysuria, no frequency, no urgency, no nocturia. Musculoskeletal:No arthralgias, no back pain, no gait disturbance or myalgias. Neurological: No dizziness, no headaches, no numbness, no seizures, no syncope, no weakness, no tremors. Hematologic: No lymphadenopathy, no easy bruising. Psychiatric: No confusion, no hallucinations, no sleep disturbance.    Physical Exam: Filed Vitals:   03/19/14 1141  BP: 181/66  Pulse: 64   the general appearance reveals a large gentleman in no distress his weight is unchanged since last visit.  He is still significantly obese.The head and neck exam reveals pupils equal and reactive.  Extraocular movements are full.  There is no scleral icterus.  The mouth and pharynx are normal.  The neck is supple.  The carotids reveal no bruits.  The jugular venous pressure is normal.  The  thyroid is not enlarged.  There is no lymphadenopathy.  The chest is clear to percussion and auscultation.  There are no rales or rhonchi.  Expansion of the chest is symmetrical.  The precordium is quiet.  Pulse is irregular  The first heart sound is normal.  The second heart sound is physiologically split.  There is systolic flow murmur across his prosthetic aortic valve.  A diastolic murmur.l lift or heave.  The abdomen is soft and nontender.  The bowel sounds are normal.  The liver and spleen are not enlarged.  There are no abdominal masses.  There are no abdominal bruits.  Extremities  reveal good pedal pulses.  There is 2+ pretibial edema. There is no cyanosis or clubbing.  Strength is normal and symmetrical in all extremities.  There is no lateralizing weakness.  There are no sensory deficits.  The skin is warm and dry.  There is no rash.    EKG shows sinus bradycardia at 51 per minute.  Occasional PVC.  Bifascicular block and first degree AV block are present.  Assessment / Plan:  Continue on same medication.  From a cardiac standpoint the patient is cleared for his upcoming knee surgery. Recheck here in 4 months for followup office visit and EKG.

## 2014-03-19 NOTE — Patient Instructions (Signed)
Your physician recommends that you continue on your current medications as directed. Please refer to the Current Medication list given to you today.  Your physician wants you to follow-up in: 4 month ov/ekg  You will receive a reminder letter in the mail two months in advance. If you don't receive a letter, please call our office to schedule the follow-up appointment.  

## 2014-03-25 ENCOUNTER — Other Ambulatory Visit: Payer: Self-pay | Admitting: Cardiology

## 2014-03-28 ENCOUNTER — Other Ambulatory Visit: Payer: Self-pay | Admitting: Cardiology

## 2014-03-31 ENCOUNTER — Other Ambulatory Visit: Payer: Self-pay | Admitting: Cardiology

## 2014-04-04 ENCOUNTER — Other Ambulatory Visit: Payer: Self-pay | Admitting: Cardiology

## 2014-05-10 NOTE — Progress Notes (Signed)
Arlee Muslim, PA - Please enter preop orders in Epic for Jahking Lesser - surg date 6/1.  Thanks.

## 2014-05-11 ENCOUNTER — Encounter (HOSPITAL_COMMUNITY): Payer: Self-pay | Admitting: Pharmacy Technician

## 2014-05-17 ENCOUNTER — Other Ambulatory Visit: Payer: Self-pay | Admitting: Orthopedic Surgery

## 2014-05-18 ENCOUNTER — Encounter (HOSPITAL_COMMUNITY): Payer: Self-pay

## 2014-05-18 ENCOUNTER — Ambulatory Visit (HOSPITAL_COMMUNITY)
Admission: RE | Admit: 2014-05-18 | Discharge: 2014-05-18 | Disposition: A | Discharge status: Home / Self Care | Payer: Medicare Other | Source: Ambulatory Visit | Attending: Orthopedic Surgery | Admitting: Orthopedic Surgery

## 2014-05-18 ENCOUNTER — Encounter (HOSPITAL_COMMUNITY)
Admission: RE | Admit: 2014-05-18 | Discharge: 2014-05-18 | Disposition: A | Discharge status: Home / Self Care | Payer: Medicare Other | Source: Ambulatory Visit | Attending: Orthopedic Surgery | Admitting: Orthopedic Surgery

## 2014-05-18 DIAGNOSIS — Z954 Presence of other heart-valve replacement: Secondary | ICD-10-CM | POA: Insufficient documentation

## 2014-05-18 DIAGNOSIS — Z951 Presence of aortocoronary bypass graft: Secondary | ICD-10-CM | POA: Insufficient documentation

## 2014-05-18 DIAGNOSIS — Z01812 Encounter for preprocedural laboratory examination: Secondary | ICD-10-CM | POA: Insufficient documentation

## 2014-05-18 DIAGNOSIS — M479 Spondylosis, unspecified: Secondary | ICD-10-CM | POA: Insufficient documentation

## 2014-05-18 DIAGNOSIS — I517 Cardiomegaly: Secondary | ICD-10-CM | POA: Insufficient documentation

## 2014-05-18 DIAGNOSIS — I2510 Atherosclerotic heart disease of native coronary artery without angina pectoris: Secondary | ICD-10-CM | POA: Insufficient documentation

## 2014-05-18 DIAGNOSIS — I700 Atherosclerosis of aorta: Secondary | ICD-10-CM | POA: Insufficient documentation

## 2014-05-18 DIAGNOSIS — Z01818 Encounter for other preprocedural examination: Secondary | ICD-10-CM | POA: Insufficient documentation

## 2014-05-18 HISTORY — DX: Anemia, unspecified: D64.9

## 2014-05-18 HISTORY — DX: Anxiety disorder, unspecified: F41.9

## 2014-05-18 HISTORY — DX: Other bursal cyst, other site: M71.38

## 2014-05-18 HISTORY — DX: Pneumonia, unspecified organism: J18.9

## 2014-05-18 HISTORY — DX: Reserved for concepts with insufficient information to code with codable children: IMO0002

## 2014-05-18 HISTORY — DX: Unspecified atrial flutter: I48.92

## 2014-05-18 HISTORY — DX: Inflammatory liver disease, unspecified: K75.9

## 2014-05-18 HISTORY — DX: Tinea unguium: B35.1

## 2014-05-18 HISTORY — DX: Unspecified osteoarthritis, unspecified site: M19.90

## 2014-05-18 HISTORY — DX: Strain of muscle, fascia and tendon of other parts of biceps, unspecified arm, initial encounter: S46.219A

## 2014-05-18 LAB — PROTIME-INR
INR: 1.02 (ref 0.00–1.49)
Prothrombin Time: 13.2 seconds (ref 11.6–15.2)

## 2014-05-18 LAB — URINALYSIS, ROUTINE W REFLEX MICROSCOPIC
Bilirubin Urine: NEGATIVE
Glucose, UA: NEGATIVE mg/dL
HGB URINE DIPSTICK: NEGATIVE
Ketones, ur: NEGATIVE mg/dL
LEUKOCYTES UA: NEGATIVE
Nitrite: NEGATIVE
PH: 5.5 (ref 5.0–8.0)
Protein, ur: 100 mg/dL — AB
Specific Gravity, Urine: 1.01 (ref 1.005–1.030)
Urobilinogen, UA: 0.2 mg/dL (ref 0.0–1.0)

## 2014-05-18 LAB — CBC
HCT: 39.2 % (ref 39.0–52.0)
HEMOGLOBIN: 13.6 g/dL (ref 13.0–17.0)
MCH: 30.2 pg (ref 26.0–34.0)
MCHC: 34.7 g/dL (ref 30.0–36.0)
MCV: 86.9 fL (ref 78.0–100.0)
PLATELETS: 133 10*3/uL — AB (ref 150–400)
RBC: 4.51 MIL/uL (ref 4.22–5.81)
RDW: 14.7 % (ref 11.5–15.5)
WBC: 5.5 10*3/uL (ref 4.0–10.5)

## 2014-05-18 LAB — APTT: APTT: 31 s (ref 24–37)

## 2014-05-18 LAB — COMPREHENSIVE METABOLIC PANEL
ALT: 15 U/L (ref 0–53)
AST: 22 U/L (ref 0–37)
Albumin: 3.5 g/dL (ref 3.5–5.2)
Alkaline Phosphatase: 62 U/L (ref 39–117)
BUN: 47 mg/dL — ABNORMAL HIGH (ref 6–23)
CALCIUM: 8.6 mg/dL (ref 8.4–10.5)
CHLORIDE: 99 meq/L (ref 96–112)
CO2: 20 meq/L (ref 19–32)
Creatinine, Ser: 2.37 mg/dL — ABNORMAL HIGH (ref 0.50–1.35)
GFR calc Af Amer: 29 mL/min — ABNORMAL LOW (ref >90)
GFR calc non Af Amer: 25 mL/min — ABNORMAL LOW (ref >90)
GLUCOSE: 125 mg/dL — AB (ref 70–99)
POTASSIUM: 4.9 meq/L (ref 3.7–5.3)
SODIUM: 132 meq/L — AB (ref 137–147)
Total Bilirubin: 0.2 mg/dL — ABNORMAL LOW (ref 0.3–1.2)
Total Protein: 6.6 g/dL (ref 6.0–8.3)

## 2014-05-18 LAB — SURGICAL PCR SCREEN
MRSA, PCR: NEGATIVE
Staphylococcus aureus: NEGATIVE

## 2014-05-18 LAB — URINE MICROSCOPIC-ADD ON

## 2014-05-18 LAB — ABO/RH: ABO/RH(D): O POS

## 2014-05-18 NOTE — Patient Instructions (Signed)
20 [name]  05/18/2014   Your procedure is scheduled on: 05/24/14  Report to [facility] at 9:40 AM.  Call this number if you have problems the morning of surgery [phone]: [phone]   Remember:   Do not eat food or drink liquids After Midnight.     Take these medicines the morning of surgery with A SIP OF WATER: amlodipine, klonopin, metoprolol   Do not wear jewelry, make-up or nail polish.  Do not wear lotions, powders, or perfumes. You may wear deodorant.  Do not shave 48 hours prior to surgery. Men may shave face and neck.  Do not bring valuables to the hospital.  Contacts, dentures or bridgework may not be worn into surgery.  Leave suitcase in the car. After surgery it may be brought to your room.  For patients admitted to the hospital, checkout time is 11:00 AM the day of discharge.    Please read over the following fact sheets that you were given: MRSA Information [name] Blanch, RN  pre op nurse call if needed [phone]    [facility] - Preparing for Surgery Before surgery, you can play an important role.  Because skin is not sterile, your skin needs to be as free of germs as possible.  You can reduce the number of germs on your skin by washing with CHG (chlorahexidine gluconate) soap before surgery.  CHG is an antiseptic cleaner which kills germs and bonds with the skin to continue killing germs even after washing. Please DO NOT use if you have an allergy to CHG or antibacterial soaps.  If your skin becomes reddened/irritated stop using the CHG and inform your nurse when you arrive at Short Stay. Do not shave (including legs and underarms) for at least 48 hours prior to the first CHG shower.  You may shave your face/neck. Please follow these instructions carefully:  1.  Shower with CHG Soap the night before surgery and the  morning of Surgery.  2.  If you choose to wash your hair, wash your hair first as usual with your  normal  shampoo.  3.  After you  shampoo, rinse your hair and body thoroughly to remove the  shampoo.                            4.  Use CHG as you would any other liquid soap.  You can apply chg directly  to the skin and wash                       Gently with a scrungie or clean washcloth.  5.  Apply the CHG Soap to your body ONLY FROM THE NECK DOWN.   Do not use on face/ open                           Wound or open sores. Avoid contact with eyes, ears mouth and genitals (private parts).                       Wash face,  Genitals (private parts) with your normal soap.             6.  Wash thoroughly, paying special attention to the area where your surgery  will be performed.  7.  Thoroughly rinse your body with warm water from the neck down.  8.  DO NOT shower/wash with your normal soap after using and rinsing off  the CHG Soap.                9.  Pat yourself dry with a clean towel.            10.  Wear clean pajamas.            11.  Place clean sheets on your bed the night of your first shower and do not  sleep with pets. Day of Surgery : Do not apply any lotions/deodorants the morning of surgery.  Please wear clean clothes to the hospital/surgery center.  FAILURE TO FOLLOW THESE INSTRUCTIONS MAY RESULT IN THE CANCELLATION OF YOUR SURGERY PATIENT SIGNATURE_________________________________  NURSE SIGNATURE__________________________________  ________________________________________________________________________   [name]  An incentive spirometer is a tool that can help keep your lungs clear and active. This tool measures how well you are filling your lungs with each breath. Taking long deep breaths may help reverse or decrease the chance of developing breathing (pulmonary) problems (especially infection) following:  A long period of time when you are unable to move or be active. BEFORE THE PROCEDURE   If the spirometer includes an indicator to show your best effort, your nurse or respiratory therapist  will set it to a desired goal.  If possible, sit up straight or lean slightly forward. Try not to slouch.  Hold the incentive spirometer in an upright position. INSTRUCTIONS FOR USE  1. Sit on the edge of your bed if possible, or sit up as far as you can in bed or on a chair. 2. Hold the incentive spirometer in an upright position. 3. Breathe out normally. 4. Place the mouthpiece in your mouth and seal your lips tightly around it. 5. Breathe in slowly and as deeply as possible, raising the piston or the ball toward the top of the column. 6. Hold your breath for 3-5 seconds or for as long as possible. Allow the piston or ball to fall to the bottom of the column. 7. Remove the mouthpiece from your mouth and breathe out normally. 8. Rest for a few seconds and repeat Steps 1 through 7 at least 10 times every 1-2 hours when you are awake. Take your time and take a few normal breaths between deep breaths. 9. The spirometer may include an indicator to show your best effort. Use the indicator as a goal to work toward during each repetition. 10. After each set of 10 deep breaths, practice coughing to be sure your lungs are clear. If you have an incision (the cut made at the time of surgery), support your incision when coughing by placing a pillow or rolled up towels firmly against it. Once you are able to get out of bed, walk around indoors and cough well. You may stop using the incentive spirometer when instructed by your caregiver.  RISKS AND COMPLICATIONS  Take your time so you do not get dizzy or light-headed.  If you are in pain, you may need to take or ask for pain medication before doing incentive spirometry. It is harder to take a deep breath if you are having pain. AFTER USE  Rest and breathe slowly and easily.  It can be helpful to keep track of a log of your progress. Your caregiver can provide you with a simple table to help with this. If you are using the spirometer at home, follow  these instructions: [name] IF:   You are  having difficultly using the spirometer.  You have trouble using the spirometer as often as instructed.  Your pain medication is not giving enough relief while using the spirometer.  You develop fever of 100.5 F (38.1 C) or higher. SEEK IMMEDIATE MEDICAL CARE IF:   You cough up bloody sputum that had not been present before.  You develop fever of 102 F (38.9 C) or greater.  You develop worsening pain at or near the incision site. MAKE SURE YOU:   Understand these instructions.  Will watch your condition.  Will get help right away if you are not doing well or get worse. Document Released: 04/22/2007 Document Revised: 03/03/2012 Document Reviewed: 06/23/2007 Exit[employer] Patient Information 2014 [employer], [place].   ________________________________________________________________________  WHAT IS A BLOOD TRANSFUSION? Blood Transfusion Information  A transfusion is the replacement of blood or some of its parts. Blood is made up of multiple cells which provide different functions.  Red blood cells carry oxygen and are used for blood loss replacement.  White blood cells fight against infection.  Platelets control bleeding.  Plasma helps clot blood.  Other blood products are available for specialized needs, such as hemophilia or other clotting disorders. BEFORE THE TRANSFUSION  Who gives blood for transfusions?   Healthy volunteers who are fully evaluated to make sure their blood is safe. This is blood bank blood. Transfusion therapy is the safest it has ever been in the practice of medicine. Before blood is taken from a donor, a complete history is taken to make sure that person has no history of diseases nor engages in risky social behavior (examples are intravenous drug use or sexual activity with multiple partners). The donor's travel history is screened to minimize risk of transmitting infections, such as malaria. The  donated blood is tested for signs of infectious diseases, such as HIV and hepatitis. The blood is then tested to be sure it is compatible with you in order to minimize the chance of a transfusion reaction. If you or a relative donates blood, this is often done in anticipation of surgery and is not appropriate for emergency situations. It takes many days to process the donated blood. RISKS AND COMPLICATIONS Although transfusion therapy is very safe and saves many lives, the main dangers of transfusion include:   Getting an infectious disease.  Developing a transfusion reaction. This is an allergic reaction to something in the blood you were given. Every precaution is taken to prevent this. The decision to have a blood transfusion has been considered carefully by your caregiver before blood is given. Blood is not given unless the benefits outweigh the risks. AFTER THE TRANSFUSION  Right after receiving a blood transfusion, you will usually feel much better and more energetic. This is especially true if your red blood cells have gotten low (anemic). The transfusion raises the level of the red blood cells which carry oxygen, and this usually causes an energy increase.  The nurse administering the transfusion will monitor you carefully for complications. HOME CARE INSTRUCTIONS  No special instructions are needed after a transfusion. You may find your energy is better. Speak with your caregiver about any limitations on activity for underlying diseases you may have. SEEK MEDICAL CARE IF:   Your condition is not improving after your transfusion.  You develop redness or irritation at the intravenous (IV) site. SEEK IMMEDIATE MEDICAL CARE IF:  Any of the following symptoms occur over the next 12 hours:  Shaking chills.  You have a temperature by mouth above  102 F (38.9 C), not controlled by medicine.  Chest, back, or muscle pain.  People around you feel you are not acting correctly or are  confused.  Shortness of breath or difficulty breathing.  Dizziness and fainting.  You get a rash or develop hives.  You have a decrease in urine output.  Your urine turns a dark color or changes to pink, red, or brown. Any of the following symptoms occur over the next 10 days:  You have a temperature by mouth above 102 F (38.9 C), not controlled by medicine.  Shortness of breath.  Weakness after normal activity.  The white part of the eye turns yellow (jaundice).  You have a decrease in the amount of urine or are urinating less often.  Your urine turns a dark color or changes to pink, red, or brown. Document Released: 12/07/2000 Document Revised: 03/03/2012 Document Reviewed: 07/26/2008 [facility] Patient Information 2014 [place], [place].  _______________________________________________________________________

## 2014-05-18 NOTE — Progress Notes (Signed)
Surgery clearance note Dr. Maceo Pro on chart, EKG 03/19/14 on EPIC

## 2014-05-23 ENCOUNTER — Other Ambulatory Visit: Payer: Self-pay | Admitting: Orthopedic Surgery

## 2014-05-23 MED ORDER — VANCOMYCIN HCL 10 G IV SOLR
1500.0000 mg | INTRAVENOUS | Status: AC
  Administered 2014-05-24: 1500 mg via INTRAVENOUS
  Filled 2014-05-23 (×2): qty 1500

## 2014-05-23 NOTE — H&P (Signed)
[name] R. Noyes MD DOB: 02/17/1941 Married / Language: English / Race: White Male  Date of Admission:  05-24-2014  Chief Complaint:  Right Knee Pain  History of Present Illness The patient is a 73 year old male who comes in for a preoperative History and Physical. The patient is scheduled for a right total knee arthroplasty to be performed by Dr. Frank V. [name], MD at [facility] on 05-24-2014. The patient is a 73 year old male who presents with knee complaints. The patient was seen for a second opinion. The patient reports right knee symptoms including: pain which began over 1 year(s) (1/2) ago following a specific injury. The injury occurred due to a fall. Prior to being seen today the patient was previously evaluated by a colleague (Dr. Anna Voytek) 1 year(s) ago (1/2). Previous work-up for this problem has included knee x-rays. Past treatment for this problem has included intra-articular injection of corticosteroids (and viscosupplementation, first series helped, 2nd did not). Note for "Knee pain": Dr Voytek referred him here to discuss surgery and consider knee replacement. He's had pain in his knee for several years. It's gotten much worse in the past year to 2 years. He has seen Dr. Voytek, and she has treated him for his knee pain. She has performed cortisone and Visco supplement injections. The last series of Visco supplements did not help at all. Dr. Ambler states that his knee is hurting at all times. It is limiting what he can and cannot do. His wife says he is nowhere near as active as he used to be and nowhere near as active as he needs to be because of this knee. He is at a stage now where he'd like to get this fixed in an effort to diminish his pain and improve his activity level. He is wishing to proceed with the knee replacement at this time. They have been treated conservatively in the past for the above stated problem and despite conservative measures, they  continue to have progressive pain and severe functional limitations and dysfunction. They have failed non-operative management including home exercise, medications, and injections. It is felt that they would benefit from undergoing total joint replacement. Risks and benefits of the procedure have been discussed with the patient and they elect to proceed with surgery. There are no active contraindications to surgery such as ongoing infection or rapidly progressive neurological disease.   Allergies Januvia *ANTIDIABETICS* Onglyza *ANTIDIABETICS* Actos *ANTIDIABETICS* Bumex *DIURETICS* Lasix *DIURETICS* Losartan Potassium *ANTIHYPERTENSIVES* Latex Exam Gloves *MEDICAL DEVICES* Minocin *TETRACYCLINES* Codeine Phosphate *ANALGESICS - OPIOID* Penicillamine *ASSORTED CLASSES*. Swelling. PLEASE NOTE - He is able to take Cephalosporin Medications without difficulty.   Problem List/Past Medical History Primary osteoarthritis of both knees (715.16  M17.0) Testosterone deficiency (257.2  E29.1) Anxiety Disorder Impaired Hearing Pneumonia. Past History Sarcoidosis Sleep Apnea High blood pressure Hypercholesterolemia Coronary artery disease Myocardial infarction. 1995, 2002, 2006 Congestive Heart Failure. 2011-2012, with slight residual intermittent lower extremity swelling. Valvular heart disease. Aortic Valve Heart murmur Arrhythmia. Atrial Flutter Atrial Fibrillation Hemorrhoids Prostate Disease. BPH Chronic Renal Failure Syndrome. Stage III Diabetes Mellitus, Type II Anemia Chronic Pain Chronic Cystitis Autoimmune disorder. child Liver disease Osteoporosis Osteoarthritis Gout Sciatica Insomnia, Chronic    Family History Other medical problems. Daughter died of MS Hypertension. Mother. Cancer. child Heart Disease. Father. Cerebrovascular Accident. Father, Mother, Paternal Grandfather. Heart disease in male family member before age 55 First Degree  Relatives. reported Diabetes Mellitus. Maternal Grandfather.    Social History Not under pain   contract No history of drug/alcohol rehab Most recent primary occupation. Medical Doctor Number of flights of stairs before winded. less than 1 Under pain contract Tobacco / smoke exposure. 02/04/2014: no Current drinker. 02/04/2014: Currently drinks beer, wine and hard liquor 5-7 times per week Children. 2 Current work status. retired Marital status. married Living situation. live with spouse Exercise. Exercises weekly; does other Tobacco use. Never smoker. 02/04/2014    Medication History ClonazePAM (0.5MG  Tablet, Oral) Active. Allopurinol (100MG  Tablet, Oral) Active. Aspirin EC (81MG  Tablet DR, Oral) Active. Lisinopril (40MG  Tablet, Oral) Active. Norvasc (10MG  Tablet, Oral) Active. Metoprolol Tartrate (50MG  Tablet, Oral) Active. MetFORMIN HCl (500MG  Tablet, Oral) Active. Testosterone Cypionate (200MG /ML Oil, Intramuscular) Active. Terazosin HCl (10MG  Capsule, Oral) Active. Lantus SoloStar (100UNIT/ML Soln Pen-inj, Subcutaneous) Active. Xarelto (15MG  Tablet, Oral) Active. Voltaren (1% Gel, Transdermal) Active. Zolpidem Tartrate (10MG  Tablet, Oral) Active. Diazepam (10MG  Tablet, Oral) Active. TraMADol HCl (50MG  Tablet, Oral) Active. Glucosamine 1500 Complex ( Oral) Specific dose unknown - Active.    Past Surgical History Valve Replacement. Aoritc Valve - Bovine Valve Prostatectomy; Transurethral Vasectomy Coronary Artery Bypass Graft. 4 or more vessels Colon Polyp Removal - Colonoscopy Cataract Surgery. 2014 right   Review of Systems - General ROS: negative Psychological ROS: positive for - chronic insomnia Endocrine ROS: positive for - diabetes Respiratory ROS: no cough, shortness of breath, or wheezing Cardiovascular ROS: no chest pain or dyspnea on exertion Gastrointestinal ROS: no abdominal pain, change in bowel habits, or black or bloody  stools Genito-Urinary ROS: no dysuria, trouble voiding, or hematuria Musculoskeletal ROS: positive for - joint pain and joint swelling Neurological ROS: negative   Vitals 05/20/2014 11:04 AM Temp.: 52 FResp.: 12 (Unlabored) BP: 142/58 (Sitting, Right Arm, Standard)   EXAM General appearance: alert, cooperative, no distress and mildly obese Head: Normocephalic, without obvious abnormality, atraumatic Eyes: EOM's intact, PRR Neck: supple, symmetrical, trachea midline and carotid brutis noted bilateral - referred from chest wall Lungs: clear to auscultation bilaterally Chest wall: no tenderness Heart: regular rate and rhythm and systolic murmur: holosystolic 3/6, crescendo and decrescendo throughout the precordium Abdomen: soft, non-tender; bowel sounds normal; no masses,  no organomegaly and moderately distended Extremities: Evaluation of his hips shows normal ROM and no discomfort. The left knee shows no effusion. Range is 0-130 with no tenderness or instability. Right knee shows no effusion. Slight varus deformity. Range 5-120. Moderate crepitus on ROM. Tenderness medial greater than lateral. No instability noted. Pulses are trace palpable bilaterally with intact sensation and motor. Trace peripheral edema is noted. Neurologic: Grossly normal   RADIOGRAPHS: AP of both knees and lateral show that he has bone on bone change in the medial and patellofemoral compartments of the right knee. His left knee shows minimal involvement.   Assessment & Plan Primary osteoarthritis of both knees (715.16  M17.0) Impression: Right Knee  Note: Plan is for a Right Total Knee Replacement by Dr. [name].  Plan is to go home versus SNF.  PCP - Dr. Maceo Pro - Patient has been seen preoperatively and felt to be stable for surgery. Cardiology - Dr. Mare Ferrari  Please note that the patient is known to have Bradycardia follow anesthesia and surgery in the past. He states that he  will typically run in the 30's and 40's at times.  The patient is currently taking Xarelto 15 mg due to CRF Stage III. He takes Valium at home for cramps.  The patient will not receive TXA (tranexamic acid) due to: Heart Disease, CAD, CABG  Avoid Left  Arm with BP and IV sticks.  Signed electronically by Alexzandrew L Perkins, III PA-C 

## 2014-05-24 ENCOUNTER — Encounter (HOSPITAL_COMMUNITY): Payer: Medicare Other | Admitting: Anesthesiology

## 2014-05-24 ENCOUNTER — Inpatient Hospital Stay (HOSPITAL_COMMUNITY)
Admission: RE | Admit: 2014-05-24 | Discharge: 2014-05-30 | DRG: 469 | Disposition: A | Discharge status: Skilled Nursing Facility | Payer: Medicare Other | Source: Ambulatory Visit | Attending: Internal Medicine | Admitting: Internal Medicine

## 2014-05-24 ENCOUNTER — Inpatient Hospital Stay (HOSPITAL_COMMUNITY): Payer: Medicare Other | Admitting: Anesthesiology

## 2014-05-24 ENCOUNTER — Encounter (HOSPITAL_COMMUNITY)
Admission: RE | Disposition: A | Discharge status: Skilled Nursing Facility | Payer: Self-pay | Source: Ambulatory Visit | Attending: Internal Medicine

## 2014-05-24 ENCOUNTER — Encounter (HOSPITAL_COMMUNITY): Payer: Self-pay

## 2014-05-24 DIAGNOSIS — N401 Enlarged prostate with lower urinary tract symptoms: Secondary | ICD-10-CM | POA: Diagnosis present

## 2014-05-24 DIAGNOSIS — Z951 Presence of aortocoronary bypass graft: Secondary | ICD-10-CM

## 2014-05-24 DIAGNOSIS — N189 Chronic kidney disease, unspecified: Secondary | ICD-10-CM

## 2014-05-24 DIAGNOSIS — Z87891 Personal history of nicotine dependence: Secondary | ICD-10-CM

## 2014-05-24 DIAGNOSIS — M109 Gout, unspecified: Secondary | ICD-10-CM | POA: Diagnosis present

## 2014-05-24 DIAGNOSIS — I4891 Unspecified atrial fibrillation: Secondary | ICD-10-CM | POA: Diagnosis present

## 2014-05-24 DIAGNOSIS — M179 Osteoarthritis of knee, unspecified: Secondary | ICD-10-CM | POA: Diagnosis present

## 2014-05-24 DIAGNOSIS — R531 Weakness: Secondary | ICD-10-CM

## 2014-05-24 DIAGNOSIS — I509 Heart failure, unspecified: Secondary | ICD-10-CM | POA: Diagnosis present

## 2014-05-24 DIAGNOSIS — M1710 Unilateral primary osteoarthritis, unspecified knee: Principal | ICD-10-CM | POA: Diagnosis present

## 2014-05-24 DIAGNOSIS — I10 Essential (primary) hypertension: Secondary | ICD-10-CM | POA: Diagnosis present

## 2014-05-24 DIAGNOSIS — N179 Acute kidney failure, unspecified: Secondary | ICD-10-CM | POA: Diagnosis not present

## 2014-05-24 DIAGNOSIS — I219 Acute myocardial infarction, unspecified: Secondary | ICD-10-CM | POA: Diagnosis present

## 2014-05-24 DIAGNOSIS — K769 Liver disease, unspecified: Secondary | ICD-10-CM | POA: Diagnosis present

## 2014-05-24 DIAGNOSIS — I071 Rheumatic tricuspid insufficiency: Secondary | ICD-10-CM

## 2014-05-24 DIAGNOSIS — E119 Type 2 diabetes mellitus without complications: Secondary | ICD-10-CM | POA: Diagnosis present

## 2014-05-24 DIAGNOSIS — Z9861 Coronary angioplasty status: Secondary | ICD-10-CM

## 2014-05-24 DIAGNOSIS — I4510 Unspecified right bundle-branch block: Secondary | ICD-10-CM | POA: Diagnosis present

## 2014-05-24 DIAGNOSIS — D696 Thrombocytopenia, unspecified: Secondary | ICD-10-CM | POA: Diagnosis present

## 2014-05-24 DIAGNOSIS — Z952 Presence of prosthetic heart valve: Secondary | ICD-10-CM

## 2014-05-24 DIAGNOSIS — N138 Other obstructive and reflux uropathy: Secondary | ICD-10-CM | POA: Diagnosis present

## 2014-05-24 DIAGNOSIS — M1990 Unspecified osteoarthritis, unspecified site: Secondary | ICD-10-CM

## 2014-05-24 DIAGNOSIS — N39490 Overflow incontinence: Secondary | ICD-10-CM | POA: Diagnosis not present

## 2014-05-24 DIAGNOSIS — I2510 Atherosclerotic heart disease of native coronary artery without angina pectoris: Secondary | ICD-10-CM | POA: Diagnosis present

## 2014-05-24 DIAGNOSIS — N390 Urinary tract infection, site not specified: Secondary | ICD-10-CM | POA: Diagnosis not present

## 2014-05-24 DIAGNOSIS — F411 Generalized anxiety disorder: Secondary | ICD-10-CM | POA: Diagnosis present

## 2014-05-24 DIAGNOSIS — I4892 Unspecified atrial flutter: Secondary | ICD-10-CM

## 2014-05-24 DIAGNOSIS — I252 Old myocardial infarction: Secondary | ICD-10-CM

## 2014-05-24 DIAGNOSIS — E871 Hypo-osmolality and hyponatremia: Secondary | ICD-10-CM | POA: Diagnosis not present

## 2014-05-24 DIAGNOSIS — I38 Endocarditis, valve unspecified: Secondary | ICD-10-CM

## 2014-05-24 DIAGNOSIS — N183 Chronic kidney disease, stage 3 unspecified: Secondary | ICD-10-CM | POA: Diagnosis present

## 2014-05-24 DIAGNOSIS — Z885 Allergy status to narcotic agent status: Secondary | ICD-10-CM

## 2014-05-24 DIAGNOSIS — N039 Chronic nephritic syndrome with unspecified morphologic changes: Secondary | ICD-10-CM

## 2014-05-24 DIAGNOSIS — G4733 Obstructive sleep apnea (adult) (pediatric): Secondary | ICD-10-CM | POA: Diagnosis present

## 2014-05-24 DIAGNOSIS — Z7901 Long term (current) use of anticoagulants: Secondary | ICD-10-CM

## 2014-05-24 DIAGNOSIS — I259 Chronic ischemic heart disease, unspecified: Secondary | ICD-10-CM | POA: Diagnosis present

## 2014-05-24 DIAGNOSIS — I359 Nonrheumatic aortic valve disorder, unspecified: Secondary | ICD-10-CM | POA: Diagnosis present

## 2014-05-24 DIAGNOSIS — R509 Fever, unspecified: Secondary | ICD-10-CM

## 2014-05-24 DIAGNOSIS — D649 Anemia, unspecified: Secondary | ICD-10-CM | POA: Diagnosis not present

## 2014-05-24 DIAGNOSIS — Z91013 Allergy to seafood: Secondary | ICD-10-CM

## 2014-05-24 DIAGNOSIS — Z888 Allergy status to other drugs, medicaments and biological substances status: Secondary | ICD-10-CM

## 2014-05-24 DIAGNOSIS — I130 Hypertensive heart and chronic kidney disease with heart failure and stage 1 through stage 4 chronic kidney disease, or unspecified chronic kidney disease: Secondary | ICD-10-CM | POA: Diagnosis present

## 2014-05-24 DIAGNOSIS — Z880 Allergy status to penicillin: Secondary | ICD-10-CM

## 2014-05-24 DIAGNOSIS — N400 Enlarged prostate without lower urinary tract symptoms: Secondary | ICD-10-CM

## 2014-05-24 DIAGNOSIS — I519 Heart disease, unspecified: Secondary | ICD-10-CM | POA: Diagnosis present

## 2014-05-24 DIAGNOSIS — G9340 Encephalopathy, unspecified: Secondary | ICD-10-CM

## 2014-05-24 DIAGNOSIS — D869 Sarcoidosis, unspecified: Secondary | ICD-10-CM | POA: Diagnosis present

## 2014-05-24 DIAGNOSIS — I119 Hypertensive heart disease without heart failure: Secondary | ICD-10-CM

## 2014-05-24 DIAGNOSIS — E7800 Pure hypercholesterolemia, unspecified: Secondary | ICD-10-CM | POA: Diagnosis present

## 2014-05-24 DIAGNOSIS — Z881 Allergy status to other antibiotic agents status: Secondary | ICD-10-CM

## 2014-05-24 DIAGNOSIS — Z96651 Presence of right artificial knee joint: Secondary | ICD-10-CM

## 2014-05-24 HISTORY — DX: Type 2 diabetes mellitus without complications: E11.9

## 2014-05-24 HISTORY — PX: TOTAL KNEE ARTHROPLASTY: SHX125

## 2014-05-24 LAB — TYPE AND SCREEN
ABO/RH(D): O POS
Antibody Screen: NEGATIVE

## 2014-05-24 LAB — GLUCOSE, CAPILLARY
GLUCOSE-CAPILLARY: 191 mg/dL — AB (ref 70–99)
GLUCOSE-CAPILLARY: 205 mg/dL — AB (ref 70–99)
Glucose-Capillary: 152 mg/dL — ABNORMAL HIGH (ref 70–99)
Glucose-Capillary: 149 mg/dL — ABNORMAL HIGH (ref 70–99)

## 2014-05-24 SURGERY — ARTHROPLASTY, KNEE, TOTAL
Anesthesia: General | Site: Knee | Laterality: Right

## 2014-05-24 MED ORDER — ACETAMINOPHEN 500 MG PO TABS
1000.0000 mg | ORAL_TABLET | Freq: Four times a day (QID) | ORAL | Status: AC
  Administered 2014-05-24 – 2014-05-25 (×4): 1000 mg via ORAL
  Filled 2014-05-24 (×5): qty 2

## 2014-05-24 MED ORDER — NEOSTIGMINE METHYLSULFATE 10 MG/10ML IV SOLN
INTRAVENOUS | Status: AC
  Filled 2014-05-24: qty 1

## 2014-05-24 MED ORDER — FENTANYL CITRATE 0.05 MG/ML IJ SOLN
INTRAMUSCULAR | Status: AC
  Filled 2014-05-24: qty 2

## 2014-05-24 MED ORDER — POLYETHYLENE GLYCOL 3350 17 G PO PACK
17.0000 g | PACK | Freq: Every day | ORAL | Status: DC | PRN
  Administered 2014-05-29: 17 g via ORAL
  Filled 2014-05-24 (×2): qty 1

## 2014-05-24 MED ORDER — METOCLOPRAMIDE HCL 10 MG PO TABS: 5.0000 mg | ORAL_TABLET | Freq: Three times a day (TID) | ORAL | Status: DC | PRN

## 2014-05-24 MED ORDER — FENTANYL CITRATE 0.05 MG/ML IJ SOLN
INTRAMUSCULAR | Status: AC
  Filled 2014-05-24: qty 5

## 2014-05-24 MED ORDER — BISACODYL 10 MG RE SUPP
10.0000 mg | Freq: Every day | RECTAL | Status: DC | PRN
Start: 2014-05-24 — End: 2014-05-30
  Administered 2014-05-29: 10 mg via RECTAL
  Filled 2014-05-24 (×3): qty 1

## 2014-05-24 MED ORDER — TRAMADOL HCL 50 MG PO TABS: 50.0000 mg | ORAL_TABLET | Freq: Four times a day (QID) | ORAL | Status: DC | PRN

## 2014-05-24 MED ORDER — METOPROLOL TARTRATE 25 MG PO TABS
25.0000 mg | ORAL_TABLET | Freq: Two times a day (BID) | ORAL | Status: DC
  Administered 2014-05-24 – 2014-05-27 (×7): 25 mg via ORAL
  Filled 2014-05-24 (×11): qty 1

## 2014-05-24 MED ORDER — DEXAMETHASONE SODIUM PHOSPHATE 10 MG/ML IJ SOLN
10.0000 mg | Freq: Every day | INTRAMUSCULAR | Status: AC
  Administered 2014-05-25: 10 mg via INTRAVENOUS
  Filled 2014-05-24: qty 1

## 2014-05-24 MED ORDER — RIVAROXABAN 10 MG PO TABS
10.0000 mg | ORAL_TABLET | Freq: Every day | ORAL | Status: DC
  Administered 2014-05-25 – 2014-05-27 (×3): 10 mg via ORAL
  Filled 2014-05-24 (×6): qty 1

## 2014-05-24 MED ORDER — SODIUM CHLORIDE 0.9 % IJ SOLN
INTRAMUSCULAR | Status: DC | PRN
  Administered 2014-05-24: 30 mL

## 2014-05-24 MED ORDER — METHOCARBAMOL 500 MG PO TABS: 500.0000 mg | ORAL_TABLET | Freq: Four times a day (QID) | ORAL | Status: DC | PRN

## 2014-05-24 MED ORDER — ACETAMINOPHEN 10 MG/ML IV SOLN
1000.0000 mg | Freq: Once | INTRAVENOUS | Status: AC
  Administered 2014-05-24: 1000 mg via INTRAVENOUS
  Filled 2014-05-24: qty 100

## 2014-05-24 MED ORDER — TERAZOSIN HCL 5 MG PO CAPS
10.0000 mg | ORAL_CAPSULE | Freq: Every day | ORAL | Status: DC
  Administered 2014-05-25 – 2014-05-30 (×6): 10 mg via ORAL
  Filled 2014-05-24 (×9): qty 2

## 2014-05-24 MED ORDER — ACETAMINOPHEN 325 MG PO TABS
650.0000 mg | ORAL_TABLET | Freq: Four times a day (QID) | ORAL | Status: DC | PRN
Start: 2014-05-25 — End: 2014-05-30
  Administered 2014-05-26 – 2014-05-28 (×3): 650 mg via ORAL
  Filled 2014-05-24 (×3): qty 2

## 2014-05-24 MED ORDER — PROPOFOL 10 MG/ML IV BOLUS
INTRAVENOUS | Status: DC | PRN
  Administered 2014-05-24: 180 mg via INTRAVENOUS

## 2014-05-24 MED ORDER — BUPIVACAINE HCL 0.25 % IJ SOLN
INTRAMUSCULAR | Status: DC | PRN
  Administered 2014-05-24: 20 mL

## 2014-05-24 MED ORDER — HYDROMORPHONE HCL PF 1 MG/ML IJ SOLN
0.5000 mg | INTRAMUSCULAR | Status: DC | PRN
  Administered 2014-05-24 – 2014-05-29 (×7): 1 mg via INTRAVENOUS
  Filled 2014-05-24 (×7): qty 1

## 2014-05-24 MED ORDER — ONDANSETRON HCL 4 MG PO TABS
4.0000 mg | ORAL_TABLET | Freq: Four times a day (QID) | ORAL | Status: DC | PRN
Start: 2014-05-24 — End: 2014-05-30

## 2014-05-24 MED ORDER — CLONAZEPAM 0.5 MG PO TABS
0.5000 mg | ORAL_TABLET | Freq: Every day | ORAL | Status: DC
  Filled 2014-05-24 (×4): qty 1

## 2014-05-24 MED ORDER — DEXAMETHASONE 6 MG PO TABS
10.0000 mg | ORAL_TABLET | Freq: Every day | ORAL | Status: AC
  Filled 2014-05-24: qty 1

## 2014-05-24 MED ORDER — SODIUM CHLORIDE 0.9 % IV SOLN
INTRAVENOUS | Status: DC
  Administered 2014-05-24: 12:00:00 via INTRAVENOUS

## 2014-05-24 MED ORDER — BUPIVACAINE LIPOSOME 1.3 % IJ SUSP
20.0000 mL | Freq: Once | INTRAMUSCULAR | Status: DC
  Filled 2014-05-24: qty 20

## 2014-05-24 MED ORDER — CLONAZEPAM 1 MG PO TABS
1.5000 mg | ORAL_TABLET | Freq: Every day | ORAL | Status: DC
  Administered 2014-05-24 – 2014-05-26 (×3): 1 mg via ORAL
  Filled 2014-05-24 (×6): qty 1

## 2014-05-24 MED ORDER — PHENOL 1.4 % MT LIQD: 1.0000 | OROMUCOSAL | Status: DC | PRN

## 2014-05-24 MED ORDER — BUPIVACAINE HCL (PF) 0.25 % IJ SOLN
INTRAMUSCULAR | Status: AC
  Filled 2014-05-24: qty 30

## 2014-05-24 MED ORDER — FLEET ENEMA 7-19 GM/118ML RE ENEM: 1.0000 | ENEMA | Freq: Once | RECTAL | Status: AC | PRN

## 2014-05-24 MED ORDER — BUPIVACAINE LIPOSOME 1.3 % IJ SUSP
INTRAMUSCULAR | Status: DC | PRN
  Administered 2014-05-24: 20 mL

## 2014-05-24 MED ORDER — VANCOMYCIN HCL IN DEXTROSE 1-5 GM/200ML-% IV SOLN
1000.0000 mg | Freq: Two times a day (BID) | INTRAVENOUS | Status: AC
  Administered 2014-05-25: 1000 mg via INTRAVENOUS
  Filled 2014-05-24: qty 200

## 2014-05-24 MED ORDER — ALLOPURINOL 100 MG PO TABS
200.0000 mg | ORAL_TABLET | Freq: Every day | ORAL | Status: DC
  Administered 2014-05-25 – 2014-05-30 (×6): 200 mg via ORAL
  Filled 2014-05-24 (×7): qty 2

## 2014-05-24 MED ORDER — ONDANSETRON HCL 4 MG/2ML IJ SOLN
INTRAMUSCULAR | Status: DC | PRN
  Administered 2014-05-24: 4 mg via INTRAVENOUS

## 2014-05-24 MED ORDER — CISATRACURIUM BESYLATE (PF) 10 MG/5ML IV SOLN
INTRAVENOUS | Status: DC | PRN
  Administered 2014-05-24: 4 mg via INTRAVENOUS

## 2014-05-24 MED ORDER — EPHEDRINE SULFATE 50 MG/ML IJ SOLN
INTRAMUSCULAR | Status: DC | PRN
  Administered 2014-05-24: 10 mg via INTRAVENOUS

## 2014-05-24 MED ORDER — SODIUM CHLORIDE 0.9 % IV SOLN
INTRAVENOUS | Status: DC
  Administered 2014-05-24: 1000 mL via INTRAVENOUS

## 2014-05-24 MED ORDER — SUCCINYLCHOLINE CHLORIDE 20 MG/ML IJ SOLN
INTRAMUSCULAR | Status: DC | PRN
  Administered 2014-05-24: 100 mg via INTRAVENOUS

## 2014-05-24 MED ORDER — PROPOFOL 10 MG/ML IV BOLUS
INTRAVENOUS | Status: AC
  Filled 2014-05-24: qty 20

## 2014-05-24 MED ORDER — CISATRACURIUM BESYLATE 20 MG/10ML IV SOLN
INTRAVENOUS | Status: AC
  Filled 2014-05-24: qty 10

## 2014-05-24 MED ORDER — ONDANSETRON HCL 4 MG/2ML IJ SOLN: 4.0000 mg | Freq: Four times a day (QID) | INTRAMUSCULAR | Status: DC | PRN

## 2014-05-24 MED ORDER — INSULIN ASPART 100 UNIT/ML ~~LOC~~ SOLN: 0.0000 [IU] | SUBCUTANEOUS | Status: DC

## 2014-05-24 MED ORDER — HYDROMORPHONE HCL 2 MG PO TABS
2.0000 mg | ORAL_TABLET | ORAL | Status: DC | PRN
  Administered 2014-05-25: 4 mg via ORAL
  Administered 2014-05-25: 6 mg via ORAL
  Administered 2014-05-25 (×2): 4 mg via ORAL
  Administered 2014-05-25: 2 mg via ORAL
  Administered 2014-05-26 – 2014-05-28 (×4): 6 mg via ORAL
  Administered 2014-05-28: 2 mg via ORAL
  Administered 2014-05-28 – 2014-05-29 (×3): 4 mg via ORAL
  Filled 2014-05-24 (×3): qty 3
  Filled 2014-05-24 (×2): qty 2
  Filled 2014-05-24: qty 3
  Filled 2014-05-24 (×2): qty 1
  Filled 2014-05-24 (×2): qty 2
  Filled 2014-05-24: qty 3
  Filled 2014-05-24 (×2): qty 2

## 2014-05-24 MED ORDER — METHOCARBAMOL 1000 MG/10ML IJ SOLN
500.0000 mg | Freq: Four times a day (QID) | INTRAVENOUS | Status: DC | PRN
  Administered 2014-05-24: 500 mg via INTRAVENOUS
  Filled 2014-05-24: qty 5

## 2014-05-24 MED ORDER — LACTATED RINGERS IV SOLN
INTRAVENOUS | Status: DC
Start: 2014-05-24 — End: 2014-05-24

## 2014-05-24 MED ORDER — GLYCOPYRROLATE 0.2 MG/ML IJ SOLN
INTRAMUSCULAR | Status: AC
  Filled 2014-05-24: qty 3

## 2014-05-24 MED ORDER — METOCLOPRAMIDE HCL 5 MG/ML IJ SOLN: 5.0000 mg | Freq: Three times a day (TID) | INTRAMUSCULAR | Status: DC | PRN

## 2014-05-24 MED ORDER — INSULIN GLARGINE 100 UNIT/ML ~~LOC~~ SOLN
1.0000 [IU] | Freq: Every day | SUBCUTANEOUS | Status: DC
  Administered 2014-05-25: 6 [IU] via SUBCUTANEOUS
  Administered 2014-05-26 – 2014-05-27 (×2): 4 [IU] via SUBCUTANEOUS
  Filled 2014-05-24 (×7): qty 0.06
  Filled 2014-05-24: qty 0.04

## 2014-05-24 MED ORDER — HYDROMORPHONE HCL PF 1 MG/ML IJ SOLN
0.2500 mg | INTRAMUSCULAR | Status: DC | PRN
  Administered 2014-05-24 (×4): 0.5 mg via INTRAVENOUS

## 2014-05-24 MED ORDER — AMLODIPINE BESYLATE 10 MG PO TABS
10.0000 mg | ORAL_TABLET | Freq: Every day | ORAL | Status: DC
  Administered 2014-05-25 – 2014-05-30 (×5): 10 mg via ORAL
  Filled 2014-05-24 (×8): qty 1

## 2014-05-24 MED ORDER — TRAMADOL HCL 50 MG PO TABS
50.0000 mg | ORAL_TABLET | Freq: Two times a day (BID) | ORAL | Status: DC | PRN
  Administered 2014-05-26: 100 mg via ORAL
  Filled 2014-05-24: qty 2

## 2014-05-24 MED ORDER — SODIUM CHLORIDE 0.9 % IV SOLN
INTRAVENOUS | Status: DC
  Administered 2014-05-24 – 2014-05-25 (×2): via INTRAVENOUS

## 2014-05-24 MED ORDER — LACTATED RINGERS IV SOLN
INTRAVENOUS | Status: DC
  Administered 2014-05-24: 14:00:00 via INTRAVENOUS
  Administered 2014-05-24: 1000 mL via INTRAVENOUS

## 2014-05-24 MED ORDER — DOCUSATE SODIUM 100 MG PO CAPS
100.0000 mg | ORAL_CAPSULE | Freq: Two times a day (BID) | ORAL | Status: DC
  Administered 2014-05-25 – 2014-05-30 (×9): 100 mg via ORAL
  Filled 2014-05-24 (×10): qty 1

## 2014-05-24 MED ORDER — MENTHOL 3 MG MT LOZG
1.0000 | LOZENGE | OROMUCOSAL | Status: DC | PRN
Start: 2014-05-24 — End: 2014-05-30
  Filled 2014-05-24: qty 9

## 2014-05-24 MED ORDER — ZOLPIDEM TARTRATE 5 MG PO TABS
5.0000 mg | ORAL_TABLET | Freq: Every evening | ORAL | Status: DC | PRN
  Filled 2014-05-24: qty 1

## 2014-05-24 MED ORDER — DEXAMETHASONE SODIUM PHOSPHATE 10 MG/ML IJ SOLN
10.0000 mg | Freq: Once | INTRAMUSCULAR | Status: AC
  Administered 2014-05-24: 10 mg via INTRAVENOUS

## 2014-05-24 MED ORDER — HYDROMORPHONE HCL PF 1 MG/ML IJ SOLN
INTRAMUSCULAR | Status: AC
  Filled 2014-05-24: qty 1

## 2014-05-24 MED ORDER — SODIUM CHLORIDE 0.9 % IJ SOLN
INTRAMUSCULAR | Status: AC
  Filled 2014-05-24: qty 50

## 2014-05-24 MED ORDER — METFORMIN HCL 500 MG PO TABS: 500.0000 mg | ORAL_TABLET | Freq: Two times a day (BID) | ORAL | Status: DC

## 2014-05-24 MED ORDER — SODIUM CHLORIDE 0.9 % IJ SOLN
INTRAMUSCULAR | Status: AC
  Filled 2014-05-24: qty 10

## 2014-05-24 MED ORDER — DEXAMETHASONE SODIUM PHOSPHATE 10 MG/ML IJ SOLN
INTRAMUSCULAR | Status: AC
  Filled 2014-05-24: qty 1

## 2014-05-24 MED ORDER — ACETAMINOPHEN 650 MG RE SUPP
650.0000 mg | Freq: Four times a day (QID) | RECTAL | Status: DC | PRN
Start: 2014-05-25 — End: 2014-05-30
  Filled 2014-05-24: qty 1

## 2014-05-24 MED ORDER — EPHEDRINE SULFATE 50 MG/ML IJ SOLN
INTRAMUSCULAR | Status: AC
  Filled 2014-05-24: qty 1

## 2014-05-24 MED ORDER — DIPHENHYDRAMINE HCL 12.5 MG/5ML PO ELIX: 12.5000 mg | ORAL_SOLUTION | ORAL | Status: DC | PRN

## 2014-05-24 MED ORDER — ONDANSETRON HCL 4 MG/2ML IJ SOLN
INTRAMUSCULAR | Status: AC
  Filled 2014-05-24: qty 2

## 2014-05-24 MED ORDER — FENTANYL CITRATE 0.05 MG/ML IJ SOLN
INTRAMUSCULAR | Status: DC | PRN
  Administered 2014-05-24: 25 ug via INTRAVENOUS
  Administered 2014-05-24: 50 ug via INTRAVENOUS
  Administered 2014-05-24 (×5): 25 ug via INTRAVENOUS
  Administered 2014-05-24 (×2): 50 ug via INTRAVENOUS
  Administered 2014-05-24 (×2): 25 ug via INTRAVENOUS

## 2014-05-24 MED ORDER — INSULIN ASPART 100 UNIT/ML ~~LOC~~ SOLN
0.0000 [IU] | Freq: Three times a day (TID) | SUBCUTANEOUS | Status: DC
  Administered 2014-05-24: 3 [IU] via SUBCUTANEOUS
  Administered 2014-05-25: 5 [IU] via SUBCUTANEOUS
  Administered 2014-05-25: 11 [IU] via SUBCUTANEOUS
  Administered 2014-05-25 – 2014-05-26 (×3): 3 [IU] via SUBCUTANEOUS

## 2014-05-24 MED ORDER — DIAZEPAM 5 MG PO TABS: 10.0000 mg | ORAL_TABLET | Freq: Four times a day (QID) | ORAL | Status: DC | PRN

## 2014-05-24 SURGICAL SUPPLY — 59 items
BAG ZIPLOCK 12X15 (MISCELLANEOUS) ×3 IMPLANT
BANDAGE ELASTIC 6 VELCRO ST LF (GAUZE/BANDAGES/DRESSINGS) ×3 IMPLANT
BANDAGE ESMARK 6X9 LF (GAUZE/BANDAGES/DRESSINGS) ×1 IMPLANT
BLADE SAG 18X100X1.27 (BLADE) ×3 IMPLANT
BLADE SAW SGTL 11.0X1.19X90.0M (BLADE) ×3 IMPLANT
BNDG ESMARK 6X9 LF (GAUZE/BANDAGES/DRESSINGS) ×3
BOWL SMART MIX CTS (DISPOSABLE) ×3 IMPLANT
CAPT RP KNEE ×3 IMPLANT
CEMENT HV SMART SET (Cement) ×6 IMPLANT
CLOSURE WOUND 1/2 X4 (GAUZE/BANDAGES/DRESSINGS) ×1
CUFF TOURN SGL QUICK 34 (TOURNIQUET CUFF) ×2
CUFF TRNQT CYL 34X4X40X1 (TOURNIQUET CUFF) ×1 IMPLANT
DECANTER SPIKE VIAL GLASS SM (MISCELLANEOUS) ×3 IMPLANT
DRAPE EXTREMITY T 121X128X90 (DRAPE) ×3 IMPLANT
DRAPE POUCH INSTRU U-SHP 10X18 (DRAPES) ×3 IMPLANT
DRAPE U-SHAPE 47X51 STRL (DRAPES) ×6 IMPLANT
DRSG ADAPTIC 3X8 NADH LF (GAUZE/BANDAGES/DRESSINGS) ×3 IMPLANT
DRSG PAD ABDOMINAL 8X10 ST (GAUZE/BANDAGES/DRESSINGS) ×3 IMPLANT
DURAPREP 26ML APPLICATOR (WOUND CARE) ×3 IMPLANT
ELECT REM PT RETURN 9FT ADLT (ELECTROSURGICAL) ×3
ELECTRODE REM PT RTRN 9FT ADLT (ELECTROSURGICAL) ×1 IMPLANT
EVACUATOR 1/8 PVC DRAIN (DRAIN) ×3 IMPLANT
FACESHIELD WRAPAROUND (MASK) ×18 IMPLANT
GLOVE BIO SURGEON STRL SZ7.5 (GLOVE) IMPLANT
GLOVE BIO SURGEON STRL SZ8 (GLOVE) IMPLANT
GLOVE BIOGEL PI IND STRL 6.5 (GLOVE) IMPLANT
GLOVE BIOGEL PI IND STRL 8 (GLOVE) ×1 IMPLANT
GLOVE BIOGEL PI INDICATOR 6.5 (GLOVE)
GLOVE BIOGEL PI INDICATOR 8 (GLOVE) ×2
GLOVE SURG SS PI 6.5 STRL IVOR (GLOVE) IMPLANT
GOWN STRL REUS W/TWL LRG LVL3 (GOWN DISPOSABLE) ×3 IMPLANT
GOWN STRL REUS W/TWL XL LVL3 (GOWN DISPOSABLE) IMPLANT
HANDPIECE INTERPULSE COAX TIP (DISPOSABLE) ×2
IMMOBILIZER KNEE 20 (SOFTGOODS) ×3 IMPLANT
KIT BASIN OR (CUSTOM PROCEDURE TRAY) ×3 IMPLANT
MANIFOLD NEPTUNE II (INSTRUMENTS) ×3 IMPLANT
NDL SAFETY ECLIPSE 18X1.5 (NEEDLE) ×2 IMPLANT
NEEDLE HYPO 18GX1.5 SHARP (NEEDLE) ×4
NS IRRIG 1000ML POUR BTL (IV SOLUTION) ×3 IMPLANT
PACK TOTAL JOINT (CUSTOM PROCEDURE TRAY) ×3 IMPLANT
PAD ABD 8X10 STRL (GAUZE/BANDAGES/DRESSINGS) ×3 IMPLANT
PADDING CAST COTTON 6X4 STRL (CAST SUPPLIES) ×3 IMPLANT
POSITIONER SURGICAL ARM (MISCELLANEOUS) ×3 IMPLANT
SET HNDPC FAN SPRY TIP SCT (DISPOSABLE) ×1 IMPLANT
SPONGE GAUZE 4X4 12PLY (GAUZE/BANDAGES/DRESSINGS) ×3 IMPLANT
STRIP CLOSURE SKIN 1/2X4 (GAUZE/BANDAGES/DRESSINGS) ×2 IMPLANT
SUCTION FRAZIER 12FR DISP (SUCTIONS) ×3 IMPLANT
SUT MNCRL AB 4-0 PS2 18 (SUTURE) ×3 IMPLANT
SUT VIC AB 2-0 CT1 27 (SUTURE) ×6
SUT VIC AB 2-0 CT1 TAPERPNT 27 (SUTURE) ×3 IMPLANT
SUT VLOC 180 0 24IN GS25 (SUTURE) ×3 IMPLANT
SYR 20CC LL (SYRINGE) ×3 IMPLANT
SYR 50ML LL SCALE MARK (SYRINGE) ×3 IMPLANT
TOWEL OR 17X26 10 PK STRL BLUE (TOWEL DISPOSABLE) ×3 IMPLANT
TOWEL OR NON WOVEN STRL DISP B (DISPOSABLE) IMPLANT
TRAY FOLEY CATH 14FRSI W/METER (CATHETERS) IMPLANT
TRAY FOLEY METER SIL LF 16FR (CATHETERS) ×3 IMPLANT
WATER STERILE IRR 1500ML POUR (IV SOLUTION) ×3 IMPLANT
WRAP KNEE MAXI GEL POST OP (GAUZE/BANDAGES/DRESSINGS) ×3 IMPLANT

## 2014-05-24 NOTE — Progress Notes (Signed)
Utilization review completed.  

## 2014-05-24 NOTE — Anesthesia Preprocedure Evaluation (Addendum)
Anesthesia Evaluation  Patient identified by MRN, date of birth, ID band Patient awake    Reviewed: Allergy & Precautions, H&P , NPO status , Patient's Chart, lab work & pertinent test results  Airway Mallampati: III TM Distance: >3 FB Neck ROM: full    Dental no notable dental hx. (+) Teeth Intact, Dental Advisory Given   Pulmonary shortness of breath and with exertion, sleep apnea , former smoker,  breath sounds clear to auscultation  Pulmonary exam normal       Cardiovascular Exercise Tolerance: Poor hypertension, Pt. on medications + CAD, + Past MI, + Cardiac Stents, + CABG and +CHF + dysrhythmias Atrial Fibrillation + Valvular Problems/Murmurs AS Rhythm:regular Rate:Normal  History AVR and tricuspid repair. CABG 2006. Multiple cardioversions.  MI x 3 1995, 2002, 2006. ECG RBBB very long PR interval.  Gets a sinus bradycardia, which can be in low 40s or high 30s but is asymptomatic.   Neuro/Psych Anxiety Chronic back pain negative neurological ROS  negative psych ROS   GI/Hepatic negative GI ROS, Neg liver ROS,   Endo/Other  diabetes, Well Controlled, Type 2, Insulin Dependent, Oral Hypoglycemic Agents  Renal/GU CRFRenal diseaseStage 3 chronic kidney disease.  BUN 47  CRT 2.34  negative genitourinary   Musculoskeletal   Abdominal   Peds  Hematology negative hematology ROS (+)   Anesthesia Other Findings sarcoidosis  Reproductive/Obstetrics negative OB ROS                         Anesthesia Physical Anesthesia Plan  ASA: IV  Anesthesia Plan: General   Post-op Pain Management:    Induction: Intravenous  Airway Management Planned: Oral ETT  Additional Equipment:   Intra-op Plan:   Post-operative Plan: Extubation in OR  Informed Consent: I have reviewed the patients History and Physical, chart, labs and discussed the procedure including the risks, benefits and alternatives for the  proposed anesthesia with the patient or authorized representative who has indicated his/her understanding and acceptance.   Dental Advisory Given  Plan Discussed with: CRNA and Surgeon  Anesthesia Plan Comments:        Anesthesia Quick Evaluation

## 2014-05-24 NOTE — Progress Notes (Signed)
BIPAP mask in car. Wife can retrieve it. Settings 10-12

## 2014-05-24 NOTE — Progress Notes (Signed)
PHARMACIST - PHYSICIAN COMMUNICATION DR:  [name] CONCERNING:  METFORMIN SAFE ADMINISTRATION POLICY  RECOMMENDATION: Metformin has been placed on DISCONTINUE (rejected order) STATUS and should be reordered only after any of the conditions below are ruled out.  Current safety recommendations include avoiding metformin for a minimum of 48 hours after the patient's exposure to intravenous contrast media.  DESCRIPTION:  The Pharmacy Committee has adopted a policy that restricts the use of metformin in hospitalized patients until all the contraindications to administration have been ruled out. Specific contraindications are: [x]  Serum creatinine ? 1.5 for males []  Serum creatinine ? 1.4 for females []  Shock, acute MI, sepsis, hypoxemia, dehydration []  Planned administration of intravenous iodinated contrast media []  Heart Failure patients with low EF []  Acute or chronic metabolic acidosis (including DKA)     Doreene Eland, PharmD, BCPS.   Pager: [phone] 05/24/2014 .4:44 PM

## 2014-05-24 NOTE — Anesthesia Postprocedure Evaluation (Signed)
  Anesthesia Post-op Note  Patient: [name]  Procedure(s) Performed: Procedure(s) (LRB): RIGHT TOTAL KNEE ARTHROPLASTY (Right)  Patient Location: PACU  Anesthesia Type: General  Level of Consciousness: awake and alert   Airway and Oxygen Therapy: Patient Spontanous Breathing  Post-op Pain: mild  Post-op Assessment: Post-op Vital signs reviewed, Patient's Cardiovascular Status Stable, Respiratory Function Stable, Patent Airway and No signs of Nausea or vomiting  Last Vitals:  Filed Vitals:   05/24/14 1515  BP: 173/58  Pulse:   Temp:   Resp:     Post-op Vital Signs: stable   Complications: No apparent anesthesia complications

## 2014-05-24 NOTE — H&P (View-Only) (Signed)
[name] Pearson MD DOB: 1941/07/23 Married / Language: English / Race: White Male  Date of Admission:  05-24-2014  Chief Complaint:  Right Knee Pain  History of Present Illness The patient is a 73 year old male who comes in for a preoperative History and Physical. The patient is scheduled for a right total knee arthroplasty to be performed by Dr. Dione Plover. [name], MD at [facility] on 05-24-2014. The patient is a 73 year old male who presents with knee complaints. The patient was seen for a second opinion. The patient reports right knee symptoms including: pain which began over 1 year(s) (1/2) ago following a specific injury. The injury occurred due to a fall. Prior to being seen today the patient was previously evaluated by a colleague ([name]) 1 year(s) ago (1/2). Previous work-up for this problem has included knee x-rays. Past treatment for this problem has included intra-articular injection of corticosteroids (and viscosupplementation, first series helped, 2nd did not). Note for "Knee pain": [name] referred him here to discuss surgery and consider knee replacement. He's had pain in his knee for several years. It's gotten much worse in the past year to 2 years. He has seen Dr. [name], and she has treated him for his knee pain. She has performed cortisone and Visco supplement injections. The last series of Visco supplements did not help at all. [name] states that his knee is hurting at all times. It is limiting what he can and cannot do. His wife says he is nowhere near as active as he used to be and nowhere near as active as he needs to be because of this knee. He is at a stage now where he'd like to get this fixed in an effort to diminish his pain and improve his activity level. He is wishing to proceed with the knee replacement at this time. They have been treated conservatively in the past for the above stated problem and despite conservative measures, they  continue to have progressive pain and severe functional limitations and dysfunction. They have failed non-operative management including home exercise, medications, and injections. It is felt that they would benefit from undergoing total joint replacement. Risks and benefits of the procedure have been discussed with the patient and they elect to proceed with surgery. There are no active contraindications to surgery such as ongoing infection or rapidly progressive neurological disease.   Allergies Januvia *ANTIDIABETICS* Onglyza *ANTIDIABETICS* Actos *ANTIDIABETICS* Bumex *DIURETICS* Lasix *DIURETICS* Losartan Potassium *ANTIHYPERTENSIVES* Latex Exam Gloves *MEDICAL DEVICES* Minocin *TETRACYCLINES* Codeine Phosphate *ANALGESICS - OPIOID* Penicillamine *ASSORTED CLASSES*. Swelling. PLEASE NOTE - He is able to take Cephalosporin Medications without difficulty.   Problem List/Past Medical History Primary osteoarthritis of both knees (715.16  M17.0) Testosterone deficiency (257.2  E29.1) Anxiety Disorder Impaired Hearing Pneumonia. Past History Sarcoidosis Sleep Apnea High blood pressure Hypercholesterolemia Coronary artery disease Myocardial infarction. 1995, 2002, 2006 Congestive Heart Failure. 2011-2012, with slight residual intermittent lower extremity swelling. Valvular heart disease. Aortic Valve Heart murmur Arrhythmia. Atrial Flutter Atrial Fibrillation Hemorrhoids Prostate Disease. BPH Chronic Renal Failure Syndrome. Stage III Diabetes Mellitus, Type II Anemia Chronic Pain Chronic Cystitis Autoimmune disorder. child Liver disease Osteoporosis Osteoarthritis Gout Sciatica Insomnia, Chronic    Family History Other medical problems. Daughter died of MS Hypertension. Mother. Cancer. child Heart Disease. Father. Cerebrovascular Accident. Father, Mother, Paternal Jon Gills. Heart disease in male family member before age 71 First Degree  Relatives. reported Diabetes Mellitus. Maternal Grandfather.    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 less than 1 Under pain contract Tobacco / smoke exposure. 02/04/2014: no Current drinker. 02/04/2014: Currently drinks beer, wine and hard liquor 5-7 times per week Children. 2 Current work status. retired Marital status. married Living situation. live with spouse Exercise. Exercises weekly; does other Tobacco use. Never smoker. 02/04/2014    Medication History ClonazePAM (0.5MG  Tablet, Oral) Active. Allopurinol (100MG  Tablet, Oral) Active. Aspirin EC (81MG  Tablet DR, Oral) Active. Lisinopril (40MG  Tablet, Oral) Active. Norvasc (10MG  Tablet, Oral) Active. Metoprolol Tartrate (50MG  Tablet, Oral) Active. MetFORMIN HCl (500MG  Tablet, Oral) Active. Testosterone Cypionate (200MG /ML Oil, Intramuscular) Active. Terazosin HCl (10MG  Capsule, Oral) Active. Lantus SoloStar (100UNIT/ML Soln Pen-inj, Subcutaneous) Active. Xarelto (15MG  Tablet, Oral) Active. Voltaren (1% Gel, Transdermal) Active. Zolpidem Tartrate (10MG  Tablet, Oral) Active. Diazepam (10MG  Tablet, Oral) Active. TraMADol HCl (50MG  Tablet, Oral) Active. Glucosamine 1500 Complex ( Oral) Specific dose unknown - Active.    Past Surgical History Valve Replacement. Aoritc Valve - Bovine Valve Prostatectomy; Transurethral Vasectomy Coronary Artery Bypass Graft. 4 or more vessels Colon Polyp Removal - Colonoscopy Cataract Surgery. 2014 right   Review of Systems - General ROS: negative Psychological ROS: positive for - chronic insomnia Endocrine ROS: positive for - diabetes Respiratory ROS: no cough, shortness of breath, or wheezing Cardiovascular ROS: no chest pain or dyspnea on exertion Gastrointestinal ROS: no abdominal pain, change in bowel habits, or black or bloody  stools Genito-Urinary ROS: no dysuria, trouble voiding, or hematuria Musculoskeletal ROS: positive for - joint pain and joint swelling Neurological ROS: negative   Vitals 05/20/2014 11:04 AM Temp.: 52 FResp.: 12 (Unlabored) BP: 142/58 (Sitting, Right Arm, Standard)   EXAM General appearance: alert, cooperative, no distress and mildly obese Head: Normocephalic, without obvious abnormality, atraumatic Eyes: EOM's intact, PRR Neck: supple, symmetrical, trachea midline and carotid brutis noted bilateral - referred from chest wall Lungs: clear to auscultation bilaterally Chest wall: no tenderness Heart: regular rate and rhythm and systolic murmur: holosystolic 3/6, crescendo and decrescendo throughout the precordium Abdomen: soft, non-tender; bowel sounds normal; no masses,  no organomegaly and moderately distended Extremities: Evaluation of his hips shows normal ROM and no discomfort. The left knee shows no effusion. Range is 0-130 with no tenderness or instability. Right knee shows no effusion. Slight varus deformity. Range 5-120. Moderate crepitus on ROM. Tenderness medial greater than lateral. No instability noted. Pulses are trace palpable bilaterally with intact sensation and motor. Trace peripheral edema is noted. Neurologic: Grossly normal   RADIOGRAPHS: AP of both knees and lateral show that he has bone on bone change in the medial and patellofemoral compartments of the right knee. His left knee shows minimal involvement.   Assessment & Plan Primary osteoarthritis of both knees (715.16  M17.0) Impression: Right Knee  Note: Plan is for a Right Total Knee Replacement by Dr. [name].  Plan is to go home versus SNF.  PCP - Dr. Maceo Pro - Patient has been seen preoperatively and felt to be stable for surgery. Cardiology - Dr. Mare Ferrari  Please note that the patient is known to have Bradycardia follow anesthesia and surgery in the past. He states that he  will typically run in the 30's and 40's at times.  The patient is currently taking Xarelto 15 mg due to CRF Stage III. He takes Valium at home for cramps.  The patient will not receive TXA (tranexamic acid) due to: Heart Disease, CAD, CABG  Avoid Left  Arm with BP and IV sticks.  Signed electronically by Joelene Millin, III PA-C

## 2014-05-24 NOTE — Transfer of Care (Signed)
Immediate Anesthesia Transfer of Care Note  Patient: [name]  Procedure(s) Performed: Procedure(s): RIGHT TOTAL KNEE ARTHROPLASTY (Right)  Patient Location: PACU  Anesthesia Type:General  Level of Consciousness: awake, alert , oriented and patient cooperative  Airway & Oxygen Therapy: Patient Spontanous Breathing and Patient connected to face mask oxygen  Post-op Assessment: Report given to PACU RN and Post -op Vital signs reviewed and stable  Post vital signs: Reviewed and stable  Complications: No apparent anesthesia complications

## 2014-05-24 NOTE — Op Note (Signed)
Pre-operative diagnosis- Osteoarthritis  Right knee(s)  Post-operative diagnosis- Osteoarthritis Right knee(s)  Procedure-  Right  Total Knee Arthroplasty  Surgeon- Dione Plover. Rossie Bretado, MD  Assistant- Arlee Muslim, PA-C   Anesthesia-  General  EBL-* No blood loss amount entered *   Drains Hemovac  Tourniquet time-  Total Tourniquet Time Documented: Thigh (Right) - 31 minutes Total: Thigh (Right) - 31 minutes     Complications- None  Condition-PACU - hemodynamically stable.   Brief Clinical Note  [name] is a 73 y.o. year old male with end stage OA of his right knee with progressively worsening pain and dysfunction. He has constant pain, with activity and at rest and significant functional deficits with difficulties even with ADLs. He has had extensive non-op management including analgesics, injections of cortisone, and home exercise program, but remains in significant pain with significant dysfunction. Radiographs show bone on bone arthritis medial and patellofemoral. He presents now for right Total Knee Arthroplasty.    Procedure in detail---   The patient is brought into the operating room and positioned supine on the operating table. After successful administration of  General,   a tourniquet is placed high on the  Right thigh(s) and the lower extremity is prepped and draped in the usual sterile fashion. Time out is performed by the operating team and then the  Right lower extremity is wrapped in Esmarch, knee flexed and the tourniquet inflated to 300 mmHg.       A midline incision is made with a ten blade through the subcutaneous tissue to the level of the extensor mechanism. A fresh blade is used to make a medial parapatellar arthrotomy. Soft tissue over the proximal medial tibia is subperiosteally elevated to the joint line with a knife and into the semimembranosus bursa with a Cobb elevator. Soft tissue over the proximal lateral tibia is elevated with attention being paid  to avoiding the patellar tendon on the tibial tubercle. The patella is everted, knee flexed 90 degrees and the ACL and PCL are removed. Findings are bone on bone medial and patellofemoral with large osteophytes.        The drill is used to create a starting hole in the distal femur and the canal is thoroughly irrigated with sterile saline to remove the fatty contents. The 5 degree Right  valgus alignment guide is placed into the femoral canal and the distal femoral cutting block is pinned to remove 10 mm off the distal femur. Resection is made with an oscillating saw.      The tibia is subluxed forward and the menisci are removed. The extramedullary alignment guide is placed referencing proximally at the medial aspect of the tibial tubercle and distally along the second metatarsal axis and tibial crest. The block is pinned to remove 34mm off the more deficient medial  side. Resection is made with an oscillating saw. Size 4is the most appropriate size for the tibia and the proximal tibia is prepared with the modular drill and keel punch for that size.      The femoral sizing guide is placed and size 4 is most appropriate. Rotation is marked off the epicondylar axis and confirmed by creating a rectangular flexion gap at 90 degrees. The size 4 cutting block is pinned in this rotation and the anterior, posterior and chamfer cuts are made with the oscillating saw. The intercondylar block is then placed and that cut is made.      Trial size 4 tibial component, trial size 4  narrow posterior stabilized femur and a 12.5  mm posterior stabilized rotating platform insert trial is placed. Full extension is achieved with excellent varus/valgus and anterior/posterior balance throughout full range of motion. The patella is everted and thickness measured to be 22  mm. Free hand resection is taken to 12 mm, a 35 template is placed, lug holes are drilled, trial patella is placed, and it tracks normally. Osteophytes are removed off  the posterior femur with the trial in place. All trials are removed and the cut bone surfaces prepared with pulsatile lavage. Cement is mixed and once ready for implantation, the size 4 tibial implant, size  4 narrow posterior stabilized femoral component, and the size 35 patella are cemented in place and the patella is held with the clamp. The trial insert is placed and the knee held in full extension. The Exparel (20 ml mixed with 30 ml saline) and .25% Bupivicaine, are injected into the extensor mechanism, posterior capsule, medial and lateral gutters and subcutaneous tissues.  All extruded cement is removed and once the cement is hard the permanent 12.5 mm posterior stabilized rotating platform insert is placed into the tibial tray.      The wound is copiously irrigated with saline solution and the extensor mechanism closed over a hemovac drain with #1 V-loc suture. The tourniquet is released for a total tourniquet time of 31  minutes. Flexion against gravity is 135 degrees and the patella tracks normally. Subcutaneous tissue is closed with 2.0 vicryl and subcuticular with running 4.0 Monocryl. The incision is cleaned and dried and steri-strips and a bulky sterile dressing are applied. The limb is placed into a knee immobilizer and the patient is awakened and transported to recovery in stable condition.      Please note that a surgical assistant was a medical necessity for this procedure in order to perform it in a safe and expeditious manner. Surgical assistant was necessary to retract the ligaments and vital neurovascular structures to prevent injury to them and also necessary for proper positioning of the limb to allow for anatomic placement of the prosthesis.   Dione Plover Neisha Hinger, MD    05/24/2014, 2:01 PM

## 2014-05-24 NOTE — Interval H&P Note (Signed)
History and Physical Interval Note:  05/24/2014 12:51 PM  [name]  has presented today for surgery, with the diagnosis of OSTEOARTHRITIS RIGHT KNEE  The various methods of treatment have been discussed with the patient and family. After consideration of risks, benefits and other options for treatment, the patient has consented to  Procedure(s): RIGHT TOTAL KNEE ARTHROPLASTY (Right) as a surgical intervention .  The patient's history has been reviewed, patient examined, no change in status, stable for surgery.  I have reviewed the patient's chart and labs.  Questions were answered to the patient's satisfaction.     Pilar Plate Aspin Palomarez V

## 2014-05-25 ENCOUNTER — Encounter (HOSPITAL_COMMUNITY): Payer: Self-pay | Admitting: Orthopedic Surgery

## 2014-05-25 LAB — GLUCOSE, CAPILLARY
Glucose-Capillary: 177 mg/dL — ABNORMAL HIGH (ref 70–99)
Glucose-Capillary: 207 mg/dL — ABNORMAL HIGH (ref 70–99)
Glucose-Capillary: 238 mg/dL — ABNORMAL HIGH (ref 70–99)
Glucose-Capillary: 309 mg/dL — ABNORMAL HIGH (ref 70–99)

## 2014-05-25 LAB — BASIC METABOLIC PANEL
BUN: 39 mg/dL — ABNORMAL HIGH (ref 6–23)
CHLORIDE: 102 meq/L (ref 96–112)
CO2: 18 mEq/L — ABNORMAL LOW (ref 19–32)
Calcium: 8.4 mg/dL (ref 8.4–10.5)
Creatinine, Ser: 2.11 mg/dL — ABNORMAL HIGH (ref 0.50–1.35)
GFR calc non Af Amer: 29 mL/min — ABNORMAL LOW (ref >90)
GFR, EST AFRICAN AMERICAN: 34 mL/min — AB (ref >90)
Glucose, Bld: 218 mg/dL — ABNORMAL HIGH (ref 70–99)
POTASSIUM: 5.6 meq/L — AB (ref 3.7–5.3)
Sodium: 132 mEq/L — ABNORMAL LOW (ref 137–147)

## 2014-05-25 LAB — CBC
HCT: 33.7 % — ABNORMAL LOW (ref 39.0–52.0)
Hemoglobin: 12.1 g/dL — ABNORMAL LOW (ref 13.0–17.0)
MCH: 32 pg (ref 26.0–34.0)
MCHC: 35.9 g/dL (ref 30.0–36.0)
MCV: 89.2 fL (ref 78.0–100.0)
PLATELETS: 126 10*3/uL — AB (ref 150–400)
RBC: 3.78 MIL/uL — AB (ref 4.22–5.81)
RDW: 15 % (ref 11.5–15.5)
WBC: 9.5 10*3/uL (ref 4.0–10.5)

## 2014-05-25 MED ORDER — ZOLPIDEM TARTRATE 5 MG PO TABS: 5.0000 mg | ORAL_TABLET | Freq: Every evening | ORAL | Status: DC | PRN

## 2014-05-25 MED ORDER — DIAZEPAM 5 MG PO TABS
10.0000 mg | ORAL_TABLET | Freq: Four times a day (QID) | ORAL | Status: DC | PRN
  Administered 2014-05-26: 10 mg via ORAL
  Filled 2014-05-25: qty 2

## 2014-05-25 MED ORDER — ZOLPIDEM TARTRATE 5 MG PO TABS
10.0000 mg | ORAL_TABLET | Freq: Every evening | ORAL | Status: DC | PRN
  Administered 2014-05-25 (×2): 10 mg via ORAL
  Filled 2014-05-25 (×2): qty 1

## 2014-05-25 NOTE — Progress Notes (Signed)
Physical Therapy Treatment Patient Details Name: [name] MRN: [id number] DOB: 29-Nov-1941 Today's Date: 05/25/2014    History of Present Illness 73 yo male s/p R TKA 05/24/14. hx o MI, CABG, DM, gout, arotic stenosis.     PT Comments    Progressing with mobility.   Follow Up Recommendations  Home health PT;Supervision - Intermittent     Equipment Recommendations  None recommended by PT    Recommendations for Other Services OT consult     Precautions / Restrictions Precautions Precautions: Fall;Knee Required Braces or Orthoses: Knee Immobilizer - Right Knee Immobilizer - Right: Discontinue once straight leg raise with < 10 degree lag Restrictions Weight Bearing Restrictions: No RLE Weight Bearing: Weight bearing as tolerated    Mobility  Bed Mobility Overal bed mobility: Needs Assistance Bed Mobility: Sit to Supine       Sit to supine: Min assist      Transfers Overall transfer level: Needs assistance Equipment used: Rolling walker (2 wheeled) Transfers: Sit to/from Stand Sit to Stand: Min guard         General transfer comment: close guard for safety  Ambulation/Gait Ambulation/Gait assistance: Min guard Ambulation Distance (Feet): 400 Feet Assistive device: Rolling walker (2 wheeled) Gait Pattern/deviations: Step-through pattern;Antalgic;Decreased stride length     General Gait Details: slow gait speed. Several brief standing rest breaks. Pt insisted on ambulating long distance.    Stairs            Wheelchair Mobility    Modified Rankin (Stroke Patients Only)       Balance                                    Cognition                            Exercises Total Joint Exercises Ankle Circles/Pumps: AROM;Both;10 reps;Supine Quad Sets: AROM;Both;10 reps;Supine Heel Slides: AAROM;Right;10 reps;Supine Hip ABduction/ADduction: AAROM;Right;10 reps;Supine Straight Leg Raises: AROM;Right;10  reps;Supine Goniometric ROM: 10-45 degrees    General Comments        Pertinent Vitals/Pain 6/10 R knee. Ice applied end of session    Home Living                      Prior Function            PT Goals (current goals can now be found in the care plan section) Progress towards PT goals: Progressing toward goals    Frequency  7X/week    PT Plan Current plan remains appropriate    Co-evaluation             End of Session   Activity Tolerance: Patient limited by pain Patient left: in bed;with call bell/phone within reach;with family/visitor present     Time: 2951-8841 PT Time Calculation (min): 31 min  Charges:  $Gait Training: 8-22 mins $Therapeutic Exercise: 8-22 mins                    G Codes:      Weston Anna, MPT Pager: [phone]

## 2014-05-25 NOTE — Evaluation (Signed)
Physical Therapy Evaluation Patient Details Name: ZEKIAH CARUTH MRN: [id number] DOB: [birth year] Today's Date: 05/25/2014   History of Present Illness  73 yo male s/p R TKA 05/24/14. hx o MI, CABG, DM, gout, arotic stenosis.   Clinical Impression  On eval, pt required Min guard assist for mobility-able to ambulate ~375 feet with walker. Pt tolerated well. Plan is for home at discharge.     Follow Up Recommendations Home health PT;Supervision - Intermittent    Equipment Recommendations  None recommended by PT    Recommendations for Other Services OT consult     Precautions / Restrictions Precautions Precautions: Knee;Fall Required Braces or Orthoses: Knee Immobilizer - Right Knee Immobilizer - Right: Discontinue once straight leg raise with < 10 degree lag Restrictions Weight Bearing Restrictions: No RLE Weight Bearing: Weight bearing as tolerated      Mobility  Bed Mobility Overal bed mobility: Needs Assistance Bed Mobility: Supine to Sit     Supine to sit: Min guard     General bed mobility comments: close guard for safety  Transfers Overall transfer level: Needs assistance Equipment used: Rolling walker (2 wheeled) Transfers: Sit to/from Stand Sit to Stand: Min guard;From elevated surface         General transfer comment: close guard for safety  Ambulation/Gait Ambulation/Gait assistance: Min guard Ambulation Distance (Feet): 375 Feet Assistive device: Rolling walker (2 wheeled) Gait Pattern/deviations: Step-through pattern;Antalgic;Decreased stride length     General Gait Details: slow gait speed. Several brief standing rest breaks. Pt insisted on ambulating long distance.   Stairs            Wheelchair Mobility    Modified Rankin (Stroke Patients Only)       Balance                                             Pertinent Vitals/Pain 2/10 r knee with activity. Ice applied end of session    [place]  expects to be discharged to:: Private residence Living Arrangements: Spouse/significant other Available Help at Discharge: Family (wife can provide limited assist) Type of Home: House Home Access: Stairs to enter Entrance Stairs-Rails: Right Entrance Stairs-Number of Steps: 4+1 Home Layout: Able to live on main level with bedroom/bathroom;Two level Home Equipment: Walker - 2 wheels;Cane - single point;Shower seat;Grab bars - toilet;Bedside commode;Transport chair Additional Comments: 1 step into walk in shower. suction up grabbar in shower. handicapped height toilet.    Prior Function Level of Independence: Independent with assistive device(s)         Comments: used cane     Hand Dominance        Extremity/Trunk Assessment   Upper Extremity Assessment: Overall WFL for tasks assessed           Lower Extremity Assessment: RLE deficits/detail RLE Deficits / Details: hip flex 3/5, hip abd/add at least 2/5, moves ankle well    Cervical / Trunk Assessment: Normal  Communication   Communication: No difficulties  Cognition Arousal/Alertness: Awake/alert Behavior During Therapy: WFL for tasks assessed/performed Overall Cognitive Status: Within Functional Limits for tasks assessed                      General Comments      Exercises        Assessment/Plan    PT Assessment Patient needs continued PT services  PT Diagnosis Difficulty walking;Abnormality of gait;Acute pain   PT Problem List Decreased strength;Decreased range of motion;Decreased balance;Decreased mobility;Pain;Decreased knowledge of use of DME;Obesity  PT Treatment Interventions DME instruction;Gait training;Stair training;Functional mobility training;Therapeutic activities;Therapeutic exercise;Patient/family education;Balance training   PT Goals (Current goals can be found in the Care Plan section) Acute Rehab PT Goals Patient Stated Goal: to be as independent as possible PT Goal Formulation:  With patient Time For Goal Achievement: 06/08/14 Potential to Achieve Goals: Good    Frequency 7X/week   Barriers to discharge        Co-evaluation               End of Session Equipment Utilized During Treatment: Gait belt Activity Tolerance: Patient tolerated treatment well Patient left: in chair;with call bell/phone within reach           Time: 0900-0936 PT Time Calculation (min): 36 min   Charges:   PT Evaluation $Initial PT Evaluation Tier I: 1 Procedure PT Treatments $Gait Training: 8-22 mins   PT G Codes:          Weston Anna, MPT Pager: [phone]

## 2014-05-25 NOTE — Progress Notes (Signed)
CSW consulted for SNF placement. PN reviewed. PT recommends HHPT at d/c. RNCM alerted and will meet with pt/spouse at noon to assist with d/c planning.  Werner Lean LCSW [phone]

## 2014-05-25 NOTE — Evaluation (Signed)
Occupational Therapy Evaluation Patient Details Name: DAYNA GEURTS MRN: [id number] DOB: 03-25-1941 Today's Date: 05/25/2014    History of Present Illness 73 yo male s/p R TKA 05/24/14. hx o MI, CABG, DM, gout, arotic stenosis.    Clinical Impression   Pt is highly motivated and doing very well for first time up with walker. He has his wife at home and other family members. Began education with self care tasks and safe walker use. Will benefit from continued OT services to improve safety and independence with self care tasks for d/c home.    Follow Up Recommendations  No OT follow up;Supervision/Assistance - 24 hour    Equipment Recommendations  None recommended by OT    Recommendations for Other Services       Precautions / Restrictions Precautions Precautions: Knee;Fall Required Braces or Orthoses: Knee Immobilizer - Right Knee Immobilizer - Right: Discontinue once straight leg raise with < 10 degree lag Restrictions Weight Bearing Restrictions: No RLE Weight Bearing: Weight bearing as tolerated      Mobility Bed Mobility Overal bed mobility: Needs Assistance Bed Mobility: Supine to Sit     Supine to sit: Min guard     General bed mobility comments: close guard for safety  Transfers Overall transfer level: Needs assistance Equipment used: Rolling walker (2 wheeled) Transfers: Sit to/from Stand Sit to Stand: Min guard;From elevated surface         General transfer comment: close guard for safety    Balance                                            ADL Overall ADL's : Needs assistance/impaired Eating/Feeding: Independent;Sitting   Grooming: Set up;Sitting   Upper Body Bathing: Set up;Sitting   Lower Body Bathing: Minimal assistance;Sit to/from stand   Upper Body Dressing : Set up;Sitting   Lower Body Dressing: Minimal assistance   Toilet Transfer: Min guard;Ambulation;RW   Toileting- Clothing Manipulation and Hygiene: Minimal  assistance;Sit to/from stand         General ADL Comments: Discussed sequence for LB dressing and safety with walker use. Pt can reach mostly down to bilateral foot but needs min assist for thoroughness with washing and min to start sock, etc. Pt tolerated activity very well today and is highly motivated.      Vision                     Perception     Praxis      Pertinent Vitals/Pain 2/10 R knee; reposition, rest     Hand Dominance     Extremity/Trunk Assessment Upper Extremity Assessment Upper Extremity Assessment: Overall WFL for tasks assessed      Cervical / Trunk Assessment Cervical / Trunk Assessment: Normal   Communication Communication Communication: No difficulties   Cognition Arousal/Alertness: Awake/alert Behavior During Therapy: WFL for tasks assessed/performed Overall Cognitive Status: Within Functional Limits for tasks assessed                     General Comments       Exercises       Shoulder Instructions      Home Living Family/patient expects to be discharged to:: Private residence Living Arrangements: Spouse/significant other Available Help at Discharge: Family (wife can provide limited assist) Type of Home: House Home Access: Stairs to enter [employer]  of Steps: 4+1 Entrance Stairs-Rails: Right Home Layout: Able to live on main level with bedroom/bathroom;Two level Alternate Level Stairs-Number of Steps: stair lift             Home Equipment: Walker - 2 wheels;Cane - single point;Shower seat;Grab bars - toilet;Bedside commode;Transport chair   Additional Comments: 1 step into walk in shower. suction up grabbar in shower. handicapped height toilet.      Prior Functioning/Environment Level of Independence: Independent with assistive device(s)        Comments: used cane    OT Diagnosis: Generalized weakness   OT Problem List: Decreased strength;Decreased knowledge of use of DME or AE   OT  Treatment/Interventions: Self-care/ADL training;Patient/family education;Therapeutic activities;DME and/or AE instruction    OT Goals(Current goals can be found in the care plan section) Acute Rehab OT Goals Patient Stated Goal: to be as independent as possible OT Goal Formulation: With patient Time For Goal Achievement: 06/01/14 Potential to Achieve Goals: Good  OT Frequency: Min 2X/week   Barriers to D/C:            Co-evaluation              End of Session Equipment Utilized During Treatment: Gait belt;Rolling walker;Right knee immobilizer  Activity Tolerance: Patient tolerated treatment well Patient left: in chair;with call bell/phone within reach   Time: 0900-0936 OT Time Calculation (min): 36 min Charges:  OT General Charges $OT Visit: 1 Procedure OT Evaluation $Initial OT Evaluation Tier I: 1 Procedure OT Treatments $Therapeutic Activity: 8-22 mins G-Codes:    Alycia Patten Eliott Amparan [phone] 05/25/2014, 12:12 PM

## 2014-05-25 NOTE — Progress Notes (Signed)
Subjective: 1 Day Post-Op Procedure(s) (LRB): RIGHT TOTAL KNEE ARTHROPLASTY (Right) Patient reports pain as mild.   Patient seen in rounds with Dr. [name].  Not much rest last night. Patient is well, but has had some minor complaints of pain in the lower thigh and top of knee, requiring pain medications We will start therapy today.  Plan is to go home versus skilled facility after hospital stay.  Objective: Vital signs in last 24 hours: Temp:  [97.3 F (36.3 C)-98.2 F (36.8 C)] 98.2 F (36.8 C) (06/02 0546) Pulse Rate:  [51-68] 52 (06/02 0546) Resp:  [11-18] 16 (06/02 0546) BP: (157-173)/(52-64) 157/55 mmHg (06/02 0546) SpO2:  [97 %-100 %] 99 % (06/02 0546) Weight:  [119.75 kg (264 lb)] 119.75 kg (264 lb) (06/01 1640)  Intake/Output from previous day:  Intake/Output Summary (Last 24 hours) at 05/25/14 0756 Last data filed at 05/25/14 0630  Gross per 24 hour  Intake   3215 ml  Output   2100 ml  Net   1115 ml    Intake/Output this shift: UOP 500 since Mn +1115  Labs:  Recent Labs  05/25/14 0436  HGB 12.1*    Recent Labs  05/25/14 0436  WBC 9.5  RBC 3.78*  HCT 33.7*  PLT 126*    Recent Labs  05/25/14 0436  NA 132*  K 5.6*  CL 102  CO2 18*  BUN 39*  CREATININE 2.11*  GLUCOSE 218*  CALCIUM 8.4   No results found for this basename: LABPT, INR,  in the last 72 hours  EXAM General - Patient is Alert, Appropriate and Oriented Extremity - Neurovascular intact Sensation intact distally Dressing - dressing C/D/I Motor Function - intact, moving foot and toes well on exam.  Hemovac pulled without difficulty.  Past Medical History  Diagnosis Date  . Coronary heart disease     stent, CABG, RBBB  . Valvular heart disease     aortic stenosis/regurgitation  . Sarcoid 2011    pulmonary and bone marrow  . Hypercalcemia 2011    due to sarcoidiosis  . Bone marrow disease   . Diabetes mellitus   . Hemorrhagic cystitis 2012  . Gout   . HTN  (hypertension)   . A-fib     controlled with medication  . Myocardial infarction     1995, 2002, 2006  . OSA (obstructive sleep apnea)     use bipap setting of 10 and 12  . Complication of anesthesia 11-16-13    required alot of versed per anesthesia with cataract surgery  . Chronic kidney disease (CKD), stage III (moderate)     lov note dr Koleen Nimrod nephrology 09-17-13 on chart  . Heart murmur   . Atrial flutter     hx of  . CHF (congestive heart failure)   . Pneumonia 1966, 2011    hx of  . Anxiety   . Osteoarthritis   . DDD (degenerative disc disease)   . Synovial cyst of lumbar spine     l3-l4, l4-l5, injections  . Anemia   . Hepatitis     mono  . Cancer     basal cell  . Right sciatic nerve pain     for block thursday 01-21-2014, injections  . Fungal toenail infection   . Biceps tendon tear     right    Assessment/Plan: 1 Day Post-Op Procedure(s) (LRB): RIGHT TOTAL KNEE ARTHROPLASTY (Right) Principal Problem:   OA (osteoarthritis) of knee Active Problems:   Sarcoidosis  OBSTRUCTIVE SLEEP APNEA   MYOCARDIAL INFARCTION   CORONARY HEART DISEASE   AORTIC STENOSIS   VALVULAR HEART DISEASE   Hx of CABG   Diabetes   Gout   BPH (benign prostatic hyperplasia)   Benign hypertensive heart disease without heart failure   Chronic kidney disease (CKD), stage III (moderate)  Estimated body mass index is 35.8 kg/(m^2) as calculated from the following:   Height as of this encounter: 6' (1.829 m).   Weight as of this encounter: 119.75 kg (264 lb). Advance diet Up with therapy Plan for discharge tomorrow if does well with therapy and ready to go home.  Chance may need SNF depending upon progress. DC Telemetry and transfer to 6th floor.  DVT Prophylaxis - Xarelto Weight-Bearing as tolerated to right leg D/C O2 and Pulse OX and try on Room Air  Arlee Muslim, PA-C Orthopaedic Surgery 05/25/2014, 7:56 AM

## 2014-05-25 NOTE — Progress Notes (Signed)
Pt states he is not sure when he'll be ready to be placed on BiPAP and will like to place himself on BiPAP when ready. Pt is using home nasal mask. Sterile water was added for humidification. BiPAP was set at 12/10cmH2O per home settings. Pt demonstrated how to work BiPAP machine and does not need assistance with mask application. RT will continue to monitor as needed.

## 2014-05-26 ENCOUNTER — Inpatient Hospital Stay (HOSPITAL_COMMUNITY): Payer: Medicare Other

## 2014-05-26 DIAGNOSIS — Z954 Presence of other heart-valve replacement: Secondary | ICD-10-CM

## 2014-05-26 DIAGNOSIS — G9340 Encephalopathy, unspecified: Secondary | ICD-10-CM | POA: Diagnosis not present

## 2014-05-26 DIAGNOSIS — M1710 Unilateral primary osteoarthritis, unspecified knee: Secondary | ICD-10-CM | POA: Diagnosis not present

## 2014-05-26 DIAGNOSIS — R509 Fever, unspecified: Secondary | ICD-10-CM

## 2014-05-26 DIAGNOSIS — N179 Acute kidney failure, unspecified: Secondary | ICD-10-CM | POA: Diagnosis not present

## 2014-05-26 DIAGNOSIS — R531 Weakness: Secondary | ICD-10-CM | POA: Diagnosis present

## 2014-05-26 DIAGNOSIS — I4892 Unspecified atrial flutter: Secondary | ICD-10-CM | POA: Diagnosis not present

## 2014-05-26 DIAGNOSIS — I2510 Atherosclerotic heart disease of native coronary artery without angina pectoris: Secondary | ICD-10-CM

## 2014-05-26 DIAGNOSIS — R5383 Other fatigue: Secondary | ICD-10-CM

## 2014-05-26 DIAGNOSIS — R5381 Other malaise: Secondary | ICD-10-CM

## 2014-05-26 LAB — URINALYSIS, ROUTINE W REFLEX MICROSCOPIC
Glucose, UA: 100 mg/dL — AB
KETONES UR: NEGATIVE mg/dL
NITRITE: NEGATIVE
Protein, ur: >300 mg/dL — AB
Specific Gravity, Urine: 1.023 (ref 1.005–1.030)
UROBILINOGEN UA: 0.2 mg/dL (ref 0.0–1.0)
pH: 5 (ref 5.0–8.0)

## 2014-05-26 LAB — CBC
HEMATOCRIT: 30.5 % — AB (ref 39.0–52.0)
HEMOGLOBIN: 10.4 g/dL — AB (ref 13.0–17.0)
MCH: 30.4 pg (ref 26.0–34.0)
MCHC: 34.1 g/dL (ref 30.0–36.0)
MCV: 89.2 fL (ref 78.0–100.0)
Platelets: 121 10*3/uL — ABNORMAL LOW (ref 150–400)
RBC: 3.42 MIL/uL — AB (ref 4.22–5.81)
RDW: 15.1 % (ref 11.5–15.5)
WBC: 9.2 10*3/uL (ref 4.0–10.5)

## 2014-05-26 LAB — GLUCOSE, CAPILLARY
GLUCOSE-CAPILLARY: 292 mg/dL — AB (ref 70–99)
GLUCOSE-CAPILLARY: 173 mg/dL — AB (ref 70–99)
GLUCOSE-CAPILLARY: 172 mg/dL — AB (ref 70–99)
GLUCOSE-CAPILLARY: 191 mg/dL — AB (ref 70–99)
GLUCOSE-CAPILLARY: 226 mg/dL — AB (ref 70–99)
Glucose-Capillary: 164 mg/dL — ABNORMAL HIGH (ref 70–99)

## 2014-05-26 LAB — BASIC METABOLIC PANEL
BUN: 45 mg/dL — AB (ref 6–23)
CALCIUM: 8.6 mg/dL (ref 8.4–10.5)
CO2: 20 meq/L (ref 19–32)
Chloride: 103 mEq/L (ref 96–112)
Creatinine, Ser: 2.33 mg/dL — ABNORMAL HIGH (ref 0.50–1.35)
GFR calc Af Amer: 30 mL/min — ABNORMAL LOW (ref >90)
GFR, EST NON AFRICAN AMERICAN: 26 mL/min — AB (ref >90)
Glucose, Bld: 177 mg/dL — ABNORMAL HIGH (ref 70–99)
Potassium: 4.9 mEq/L (ref 3.7–5.3)
Sodium: 134 mEq/L — ABNORMAL LOW (ref 137–147)

## 2014-05-26 LAB — URINE MICROSCOPIC-ADD ON

## 2014-05-26 MED ORDER — DIAZEPAM 10 MG PO TABS: 10.0000 mg | ORAL_TABLET | Freq: Four times a day (QID) | ORAL | Status: DC | PRN

## 2014-05-26 MED ORDER — INSULIN ASPART 100 UNIT/ML ~~LOC~~ SOLN
0.0000 [IU] | Freq: Three times a day (TID) | SUBCUTANEOUS | Status: DC
Start: 2014-05-27 — End: 2014-05-30
  Administered 2014-05-27 (×3): 2 [IU] via SUBCUTANEOUS
  Administered 2014-05-28: 3 [IU] via SUBCUTANEOUS
  Administered 2014-05-28: 1 [IU] via SUBCUTANEOUS
  Administered 2014-05-28: 3 [IU] via SUBCUTANEOUS
  Administered 2014-05-29 (×3): 2 [IU] via SUBCUTANEOUS
  Administered 2014-05-30: 3 [IU] via SUBCUTANEOUS

## 2014-05-26 MED ORDER — CIPROFLOXACIN HCL 500 MG PO TABS
500.0000 mg | ORAL_TABLET | Freq: Two times a day (BID) | ORAL | Status: DC
  Administered 2014-05-26: 500 mg via ORAL
  Filled 2014-05-26 (×2): qty 1

## 2014-05-26 MED ORDER — TRAMADOL HCL 50 MG PO TABS: 50.0000 mg | ORAL_TABLET | Freq: Four times a day (QID) | ORAL | Status: DC | PRN

## 2014-05-26 MED ORDER — HYDROMORPHONE HCL 2 MG PO TABS: 2.0000 mg | ORAL_TABLET | ORAL | Status: DC | PRN

## 2014-05-26 NOTE — Progress Notes (Addendum)
Subjective: 2 Days Post-Op Procedure(s) (LRB): RIGHT TOTAL KNEE ARTHROPLASTY (Right) Patient reports pain as mild.   Patient seen in rounds with Dr. [name]. Patient is well, but has had some minor complaints of pain in the knee and thigh, requiring pain medications Patient is ready to go home  Objective: Vital signs in last 24 hours: Temp:  [97.5 F (36.4 C)-98.3 F (36.8 C)] 98.1 F (36.7 C) (06/03 0504) Pulse Rate:  [57-88] 64 (06/03 0504) Resp:  [16-18] 16 (06/03 0504) BP: (141-191)/(56-74) 191/74 mmHg (06/03 0504) SpO2:  [94 %-98 %] 98 % (06/03 0504)  Intake/Output from previous day:  Intake/Output Summary (Last 24 hours) at 05/26/14 0734 Last data filed at 05/25/14 1828  Gross per 24 hour  Intake   1410 ml  Output    975 ml  Net    435 ml     Labs:  Recent Labs  05/25/14 0436 05/26/14 0519  HGB 12.1* 10.4*    Recent Labs  05/25/14 0436 05/26/14 0519  WBC 9.5 9.2  RBC 3.78* 3.42*  HCT 33.7* 30.5*  PLT 126* 121*    Recent Labs  05/25/14 0436 05/26/14 0519  NA 132* 134*  K 5.6* 4.9  CL 102 103  CO2 18* 20  BUN 39* 45*  CREATININE 2.11* 2.33*  GLUCOSE 218* 177*  CALCIUM 8.4 8.6   No results found for this basename: LABPT, INR,  in the last 72 hours  EXAM: General - Patient is Alert, Appropriate and Oriented Extremity - Neurovascular intact Sensation intact distally Dorsiflexion/Plantar flexion intact Incision - clean, dry, no drainage, healing Motor Function - intact, moving foot and toes well on exam.   Assessment/Plan: 2 Days Post-Op Procedure(s) (LRB): RIGHT TOTAL KNEE ARTHROPLASTY (Right) Procedure(s) (LRB): RIGHT TOTAL KNEE ARTHROPLASTY (Right) Past Medical History  Diagnosis Date  . Coronary heart disease     stent, CABG, RBBB  . Valvular heart disease     aortic stenosis/regurgitation  . Sarcoid 2011    pulmonary and bone marrow  . Hypercalcemia 2011    due to sarcoidiosis  . Bone marrow disease   . Diabetes mellitus    . Hemorrhagic cystitis 2012  . Gout   . HTN (hypertension)   . A-fib     controlled with medication  . Myocardial infarction     1995, 2002, 2006  . OSA (obstructive sleep apnea)     use bipap setting of 10 and 12  . Complication of anesthesia 11-16-13    required alot of versed per anesthesia with cataract surgery  . Chronic kidney disease (CKD), stage III (moderate)     lov note dr Koleen Nimrod nephrology 09-17-13 on chart  . Heart murmur   . Atrial flutter     hx of  . CHF (congestive heart failure)   . Pneumonia 1966, 2011    hx of  . Anxiety   . Osteoarthritis   . DDD (degenerative disc disease)   . Synovial cyst of lumbar spine     l3-l4, l4-l5, injections  . Anemia   . Hepatitis     mono  . Cancer     basal cell  . Right sciatic nerve pain     for block thursday 01-21-2014, injections  . Fungal toenail infection   . Biceps tendon tear     right  . Diabetes    Principal Problem:   OA (osteoarthritis) of knee Active Problems:   Sarcoidosis   OBSTRUCTIVE SLEEP APNEA   MYOCARDIAL INFARCTION  CORONARY HEART DISEASE   AORTIC STENOSIS   VALVULAR HEART DISEASE   Hx of CABG   Diabetes   Gout   BPH (benign prostatic hyperplasia)   Benign hypertensive heart disease without heart failure   Chronic kidney disease (CKD), stage III (moderate)  Estimated body mass index is 35.8 kg/(m^2) as calculated from the following:   Height as of this encounter: 6' (1.829 m).   Weight as of this encounter: 119.75 kg (264 lb). Up with therapy Discharge home with home health Diet - Cardiac diet, Diabetic diet and Renal diet Follow up - in 2 weeks Activity - WBAT Disposition - Home Condition Upon Discharge - Good D/C Meds - See DC Summary DVT Prophylaxis - Xarelto 10 mg this morning and then resume the 15 mg dosing at home.  May also resume his Aspirin at home.  Arlee Muslim, PA-C Orthopaedic Surgery 05/26/2014, 7:34 AM  ADDENDUM NOTE: Patient was working with therapy in  order to meet goals for discharge.  He had been walking and then attempted stair training.  He had severe pain in the leg during the therapy session so he was assisted back to bed.  Vitals were checked and he was noted to have elevated BP to 204/77.  His pulse rate was in the 90's.  His pressure came back down some to 174/71 but HR while in the 80's became irregular as per the staff.  He was also noted to have shakes or rigors as described by the staff and the patient.  The patient denies having any chest pain or discomfort throughout this episode.  Due to the irregular rhythm, EKG was performed.  The machine read out sinus with PVC's, Right BBB.  This was compared to a previous EKG done by Dr. Mare Ferrari back on 03/19/2014.  No acute changes were appreciated.  The patient and his wife state that the rigors went on for about two hours but he eventually calmed down.  He thought it might be due to an adrenaline rush associated with the episode.   He is seen at this time and was sleeping with his CPAP mask in place.  Awakened easily.  He has no complaints at this time except for being "wiped out".  He was concerned that it may have been a medication reaction of the Ultram and the Dilaudid.  Will encourage the Dilaudid for pain and try to hold on the Ultram.  Resume continuous pulse ox for closer monitoring for now.  Keep today to continue to monitor for any changes in condition.    Arlee Muslim, PA-C

## 2014-05-26 NOTE — Consult Note (Addendum)
Reason for Consult: Weakness and fever. Consult called at 11:10 PM on May 26, 2014. Referring Physician: Dr. Sharion Dove.  [name] is an 73 y.o. male.  HPI: With history of CAD status post CABG, atrial flutter/fibrillation on xarelto, aortic valve replacement tissue valve, diabetes mellitus, gout, sarcoidosis had a recent right knee surgery 2 days ago and this evening when the evening shift began the nurse found the patient was sleeping and later was found to be confused and on exam from the patient was weak. Patient's nurse found that patient was more weak on the left upper extremity. This happened around 10 PM. At the same time patient's UA showed features concerning for UTI and orthopedic PA had started on Cipro and Internal medicine consult was called. On my exam patient is alert awake and oriented but states he feels weak and fatigued. He states he is finding it difficult to bring his drink to his mouth due to weakness and feels more weak on the right upper extremity. On exam patient is able to move all extremities but has mild drift on the right upper extremity. There is no facial asymmetry. Tongue is midline. Able to see both eyes. Moves both lower extremities. Denies any chest pain or shortness of breath nausea vomiting abdominal pain diarrhea. As per the nurse patient has been feeling weak since morning and had fever around noontime. Patient's last pain medication dose was more than 10 hours ago.  Past Medical History  Diagnosis Date  . Coronary heart disease     stent, CABG, RBBB  . Valvular heart disease     aortic stenosis/regurgitation  . Sarcoid 2011    pulmonary and bone marrow  . Hypercalcemia 2011    due to sarcoidiosis  . Bone marrow disease   . Diabetes mellitus   . Hemorrhagic cystitis 2012  . Gout   . HTN (hypertension)   . A-fib     controlled with medication  . Myocardial infarction     1995, 2002, 2006  . OSA (obstructive sleep apnea)     use bipap setting of 10  and 12  . Complication of anesthesia 11-16-13    required alot of versed per anesthesia with cataract surgery  . Chronic kidney disease (CKD), stage III (moderate)     lov note dr Koleen Nimrod nephrology 09-17-13 on chart  . Heart murmur   . Atrial flutter     hx of  . CHF (congestive heart failure)   . Pneumonia 1966, 2011    hx of  . Anxiety   . Osteoarthritis   . DDD (degenerative disc disease)   . Synovial cyst of lumbar spine     l3-l4, l4-l5, injections  . Anemia   . Hepatitis     mono  . Cancer     basal cell  . Right sciatic nerve pain     for block thursday 01-21-2014, injections  . Fungal toenail infection   . Biceps tendon tear     right  . Diabetes     Past Surgical History  Procedure Laterality Date  . Cataract surgery Right 2007  . Prostate surgery  oct. 2012    TURP  . Vasectomy  1977  . Redo median sternotomy, extracorporeal cirulation, avr, tricuspid valve repair  06/13/2011    AVR(23-mm Edwards pericardial Magna-Ease valve./ TVrepair (34-mm Edwards MC3 annuloplasty ring  . Coronary artery bypass graft  2006    x 5  . Cataract surgery Left 11-16-13  . Cardioversion  N/A 12/30/2013    Procedure: CARDIOVERSION;  Surgeon: [name], MD;  Location: [facility] ENDOSCOPY;  Service: Cardiovascular;  Laterality: N/A;  10:49 cardioversion at 120 joules, then 150 joules, to SB  used  Lido 44m,  Propofol 160 mcg  . Colonoscopy N/A 01/29/2014    Procedure: COLONOSCOPY;  Surgeon: JWinfield Cunas, MD;  [facility];  Service: Endoscopy;  Laterality: N/A;  amanda//ja  . Cardioversion  2003, 2006, 2012, 2013  . Portacath placement    . Portacath removed    . Basal cell carcinoma excision Right 2015    ear  . Total knee arthroplasty Right 05/24/2014    Procedure: RIGHT TOTAL KNEE ARTHROPLASTY;  Surgeon: FGearlean Alf MD;  Location: [facility] ORS;  Service: Orthopedics;  Laterality: Right;    Family History  Problem Relation Age of Onset  . Heart attack Father    . Allergies    . Asthma    . Heart disease    . Cancer      Social History:  reports that he quit smoking about 18 years ago. His smoking use included Pipe and Cigars. He has never used smokeless tobacco. He reports that he drinks alcohol. He reports that he does not use illicit drugs.  Allergies:  Allergies  Allergen Reactions  . Actos [Pioglitazone Hydrochloride] Swelling  . Penicillins Other (See Comments)    Serum sickness, swelling  . Amiodarone Other (See Comments)    diarrhea  . Latex Swelling  . Codeine     Makes Pt aggressive  . Depakote [Divalproex Sodium] Diarrhea    severe  . Loop Diuretics     Spike in BUN levels  . Losartan Other (See Comments)  . Nsaids     Renal problems  . Orudis [Ketoprofen] Other (See Comments)    Cannot take due to renal insufficiency  . Pentazocine Lactate   . Poison Ivy Extract [Sealed Air CorporationOf Poison Ivy]   . Rozerem [Ramelteon] Diarrhea    Severe   . Sertraline Hcl     Pt not sure  . Benazepril Hcl     ? Possible lowers plateletes  . Indomethacin Other (See Comments)    Renal problems   . Minocycline Other (See Comments)    Makes dizzy and feel just lousy.  . Potassium Iodide Rash    Itching   . Shellfish Allergy Rash    Medications: I have reviewed the patient's current medications.  Results for orders placed during the hospital encounter of 05/24/14 (from the past 48 hour(s))  CBC     Status: Abnormal   Collection Time    05/25/14  4:36 AM      Result Value Ref Range   WBC 9.5  4.0 - 10.5 K/uL   RBC 3.78 (*) 4.22 - 5.81 MIL/uL   Hemoglobin 12.1 (*) 13.0 - 17.0 g/dL   HCT 33.7 (*) 39.0 - 52.0 %   MCV 89.2  78.0 - 100.0 fL   MCH 32.0  26.0 - 34.0 pg   MCHC 35.9  30.0 - 36.0 g/dL   RDW 15.0  11.5 - 15.5 %   Platelets 126 (*) 150 - 400 K/uL  BASIC METABOLIC PANEL     Status: Abnormal   Collection Time    05/25/14  4:36 AM      Result Value Ref Range   Sodium 132 (*) 137 - 147 mEq/L   Potassium 5.6 (*) 3.7 - 5.3  mEq/L   Chloride 102  96 -  112 mEq/L   CO2 18 (*) 19 - 32 mEq/L   Glucose, Bld 218 (*) 70 - 99 mg/dL   BUN 39 (*) 6 - 23 mg/dL   Creatinine, Ser 2.11 (*) 0.50 - 1.35 mg/dL   Calcium 8.4  8.4 - 10.5 mg/dL   GFR calc non Af Amer 29 (*) >90 mL/min   GFR calc Af Amer 34 (*) >90 mL/min   Comment: (NOTE)     The eGFR has been calculated using the CKD EPI equation.     This calculation has not been validated in all clinical situations.     eGFR's persistently <90 mL/min signify possible Chronic Kidney     Disease.  GLUCOSE, CAPILLARY     Status: Abnormal   Collection Time    05/25/14  7:33 AM      Result Value Ref Range   Glucose-Capillary 177 (*) 70 - 99 mg/dL  GLUCOSE, CAPILLARY     Status: Abnormal   Collection Time    05/25/14  9:47 AM      Result Value Ref Range   Glucose-Capillary 207 (*) 70 - 99 mg/dL  GLUCOSE, CAPILLARY     Status: Abnormal   Collection Time    05/25/14 11:42 AM      Result Value Ref Range   Glucose-Capillary 238 (*) 70 - 99 mg/dL  GLUCOSE, CAPILLARY     Status: Abnormal   Collection Time    05/25/14  5:35 PM      Result Value Ref Range   Glucose-Capillary 309 (*) 70 - 99 mg/dL  GLUCOSE, CAPILLARY     Status: Abnormal   Collection Time    05/25/14  8:58 PM      Result Value Ref Range   Glucose-Capillary 292 (*) 70 - 99 mg/dL  CBC     Status: Abnormal   Collection Time    05/26/14  5:19 AM      Result Value Ref Range   WBC 9.2  4.0 - 10.5 K/uL   RBC 3.42 (*) 4.22 - 5.81 MIL/uL   Hemoglobin 10.4 (*) 13.0 - 17.0 g/dL   HCT 30.5 (*) 39.0 - 52.0 %   MCV 89.2  78.0 - 100.0 fL   MCH 30.4  26.0 - 34.0 pg   MCHC 34.1  30.0 - 36.0 g/dL   RDW 15.1  11.5 - 15.5 %   Platelets 121 (*) 150 - 400 K/uL  BASIC METABOLIC PANEL     Status: Abnormal   Collection Time    05/26/14  5:19 AM      Result Value Ref Range   Sodium 134 (*) 137 - 147 mEq/L   Potassium 4.9  3.7 - 5.3 mEq/L   Chloride 103  96 - 112 mEq/L   CO2 20  19 - 32 mEq/L   Glucose, Bld 177 (*) 70  - 99 mg/dL   BUN 45 (*) 6 - 23 mg/dL   Creatinine, Ser 2.33 (*) 0.50 - 1.35 mg/dL   Calcium 8.6  8.4 - 10.5 mg/dL   GFR calc non Af Amer 26 (*) >90 mL/min   GFR calc Af Amer 30 (*) >90 mL/min   Comment: (NOTE)     The eGFR has been calculated using the CKD EPI equation.     This calculation has not been validated in all clinical situations.     eGFR's persistently <90 mL/min signify possible Chronic Kidney     Disease.  GLUCOSE, CAPILLARY     Status:  Abnormal   Collection Time    05/26/14  7:21 AM      Result Value Ref Range   Glucose-Capillary 164 (*) 70 - 99 mg/dL  GLUCOSE, CAPILLARY     Status: Abnormal   Collection Time    05/26/14  9:58 AM      Result Value Ref Range   Glucose-Capillary 173 (*) 70 - 99 mg/dL  GLUCOSE, CAPILLARY     Status: Abnormal   Collection Time    05/26/14 12:30 PM      Result Value Ref Range   Glucose-Capillary 172 (*) 70 - 99 mg/dL  GLUCOSE, CAPILLARY     Status: Abnormal   Collection Time    05/26/14  5:00 PM      Result Value Ref Range   Glucose-Capillary 191 (*) 70 - 99 mg/dL  URINALYSIS, ROUTINE W REFLEX MICROSCOPIC     Status: Abnormal   Collection Time    05/26/14  6:32 PM      Result Value Ref Range   Color, Urine YELLOW  YELLOW   APPearance CLOUDY (*) CLEAR   Specific Gravity, Urine 1.023  1.005 - 1.030   pH 5.0  5.0 - 8.0   Glucose, UA 100 (*) NEGATIVE mg/dL   Hgb urine dipstick SMALL (*) NEGATIVE   Bilirubin Urine SMALL (*) NEGATIVE   Ketones, ur NEGATIVE  NEGATIVE mg/dL   Protein, ur >300 (*) NEGATIVE mg/dL   Urobilinogen, UA 0.2  0.0 - 1.0 mg/dL   Nitrite NEGATIVE  NEGATIVE   Leukocytes, UA MODERATE (*) NEGATIVE  URINE MICROSCOPIC-ADD ON     Status: Abnormal   Collection Time    05/26/14  6:32 PM      Result Value Ref Range   Squamous Epithelial / LPF RARE  RARE   WBC, UA 21-50  <3 WBC/hpf   RBC / HPF 3-6  <3 RBC/hpf   Bacteria, UA FEW (*) RARE   Casts HYALINE CASTS (*) NEGATIVE  GLUCOSE, CAPILLARY     Status: Abnormal    Collection Time    05/26/14  9:27 PM      Result Value Ref Range   Glucose-Capillary 226 (*) 70 - 99 mg/dL    No results found.  Review of Systems  Constitutional: Positive for fever.  HENT: Negative.   Eyes: Negative.   Respiratory: Negative.   Cardiovascular: Negative.   Gastrointestinal: Negative.   Genitourinary: Negative.   Musculoskeletal: Negative.   Neurological: Positive for focal weakness and weakness.  Endo/Heme/Allergies: Negative.   Psychiatric/Behavioral: Negative.    Blood pressure 156/71, pulse 107, temperature 102 F (38.9 C), temperature source Oral, resp. rate 22, height 6' (1.829 m), weight 119.75 kg (264 lb), SpO2 98.00%. Physical Exam  Constitutional: He is oriented to person, place, and time. He appears well-developed and well-nourished. No distress.  HENT:  Head: Normocephalic and atraumatic.  Eyes: Conjunctivae are normal. Pupils are equal, round, and reactive to light. Right eye exhibits no discharge. Left eye exhibits no discharge. No scleral icterus.  Neck: Normal range of motion. Neck supple.  Cardiovascular: Normal rate.   Respiratory: Effort normal and breath sounds normal. No respiratory distress. He has no wheezes. He has no rales.  GI: Soft. He exhibits no distension. There is no tenderness. There is no rebound and no guarding.  Musculoskeletal: He exhibits no edema.  Right knee dressing.  Neurological: He is alert and oriented to person, place, and time.  Mild weakness of the right upper weakness.   Skin:  He is not diaphoretic.  Dressing right knee.  Psychiatric: He has a normal mood and affect.    Assessment/Plan: #1. Weakness - given patient's mild right upper extremity weakness with history of atrial flutter/fibrillation at this time concerning for stroke for which I have called neurology consult with Dr. Armida Sans immediately. Patient is on xarelto and not a candidate for TPA. Dr. Armida Sans has advised to transfer patient to [facility] cone  after CT head without contrast for further workup for possible stroke. Patient will be placed on swallow evaluation and neuro checks. Check MRI brain. #2. Fever - most likely from UTI. Get blood cultures check chest x-ray and urine cultures. Continue on Cipro for now. #3. Atrial fibrillation/flutter - rate controlled. Continue xarelto if there is no bleed in the CT head and further recommendations per neurologist.  #4. Diabetes mellitus - closely follow CBGs. On insulin. #5. CAD status post CABG and stenting - denies any chest pain. Check troponin and EKG due to weakness. #6. Aortic valve replacement with tissue valve - check blood cultures due to fever. #7. Chronic kidney disease - creatinine appears to be at baseline. #8. Anemia - follow CBC. #9. Chronic thrombocytopenia - follow CBC.  I have ordered repeat Labs including metabolic panel. If there is further worsening in creatinine then Xarelto may have to be changed to Coumadin/Heparin.  Patient will be transferred to [facility] for further workup for possible stroke. Patient is in agreement for transfer. Dr. Posey Pronto will be the accepting physician.  Thanks for involving [place] in patient's care we will follow along with you.  Rise Patience 05/26/2014, 11:52 PM

## 2014-05-26 NOTE — Progress Notes (Signed)
Occupational Therapy Treatment Patient Details Name: [name] MRN: [id number] DOB: 10-13-1941 Today's Date: 05/26/2014    History of present illness 73 yo male s/p R TKA 05/24/14. hx o MI, CABG, DM, gout, arotic stenosis.    OT comments  Pt visibly shaky upon OT entering room for session. Pt wanting to get up to void. Nursing called to room and she is aware of shaking episode already. BP at EOB 204/77. Pain 2-3/10. Nursing stated ok to pivot to [facility]. Utilized +2 for safety due to shaking episode and BP. Will need an additional session of OT to address toileting on higher toilet with bar similar to home and shower transfer if pt is ready. Used KI as pt having difficulty raising R LE this session.     Follow Up Recommendations  No OT follow up;Supervision/Assistance - 24 hour (as long as pt progresses with sessions)    Equipment Recommendations  None recommended by OT    Recommendations for Other Services      Precautions / Restrictions Precautions Precautions: Knee;Fall Required Braces or Orthoses: Knee Immobilizer - Right Knee Immobilizer - Right: Discontinue once straight leg raise with < 10 degree lag Restrictions Weight Bearing Restrictions: No RLE Weight Bearing: Weight bearing as tolerated       Mobility Bed Mobility Overal bed mobility: Needs Assistance Bed Mobility: Supine to Sit     Supine to sit: Min guard Sit to supine: Min assist   General bed mobility comments: min guard for safety to sit up as pt very shaky this am. Min assist to bring R LE onto bed. Pt used trapeze to reposition higher in bed.   Transfers Overall transfer level: Needs assistance Equipment used: Rolling walker (2 wheeled) Transfers: Sit to/from Stand Sit to Stand: Min assist         General transfer comment: min guard sit to stand from EOB. Min assist from [facility]. verbal cues for hand placement. pt very shaky.    Balance                                   ADL                            Toilet Transfer: +2 for safety/equipment;Minimal assistance;BSC;RW   Toileting- Clothing Manipulation and Hygiene: +2 for safety Minimal assistance;Sit to/from stand         General ADL Comments: Pt visibly shaking when OT entered the room. Wife present. Pt asking for orange juice and nursing states ok for juice. Informed nursing of pt shaking so hard and she is already aware and states ok to work with pt to tolerance. Pt reporting pain to be 2-3/10 and states he is not sure what the shaking is from. Nursing in and provided stand by assist for transfer to [facility] only as pt's BP at 204/77 at EOB. +2 for safety due to pt shaking and not feeling well. Sats 96% on RA and HR 96. Pt able to void on commode and nursing tech then present for stand by assist for back to bed. Pt stating he is worried his heart rate becoming irregular (nursing called to room and checked HR). Helped pt to get comfortable back in bed. Will need to see pt for another session before d/c. Pt able to perform gait and stairs with PT earlier this am.  Vision                     Perception     Praxis      Cognition   Behavior During Therapy: Anxious Overall Cognitive Status: Within Functional Limits for tasks assessed                       Extremity/Trunk Assessment                  Shoulder Instructions       General Comments      Pertinent Vitals/ Pain       204/77 BP at EOB, 96% O2 RA, HR 96; pain 2-3/10  Home Living                                          Prior Functioning/Environment              Frequency Min 2X/week     Progress Toward Goals  OT Goals(current goals can now be found in the care plan section)  Progress towards OT goals: Not progressing toward goals - comment (pt's BP elevated and pt very shaky this am.)     Plan Discharge plan remains appropriate    Co-evaluation                 End of  Session Equipment Utilized During Treatment: Gait belt;Rolling walker;Right knee immobilizer   Activity Tolerance Patient tolerated treatment well   Patient Left in bed;with call bell/phone within reach   Nurse Communication          Time: 1107-1150 OT Time Calculation (min): 43 min  Charges: OT General Charges $OT Visit: 1 Procedure OT Treatments $Self Care/Home Management : 8-22 mins $Therapeutic Activity: 23-37 mins  [place] [phone] 05/26/2014, 12:16 PM

## 2014-05-26 NOTE — Discharge Instructions (Signed)

## 2014-05-26 NOTE — Progress Notes (Signed)
During shift assessment patient found to be much weaker than he was at 0600 this AM.  Patient is oriented but a little slow to respond.  Patient also has a fever.  Matt Babbish PA ordered Cipro for the fever and ordered a medical consult to evaluate patient's weakness.

## 2014-05-26 NOTE — Progress Notes (Signed)
Upon entering room to ask if patient was going to wear his CPAP, patient was already on CPAP machine asleep.

## 2014-05-26 NOTE — Progress Notes (Signed)
Occupational Therapy Treatment Patient Details Name: [name] MRN: [id number] DOB: 09/14/1941 Today's Date: 05/26/2014    History of present illness 73 yo male s/p R TKA 05/24/14. hx o MI, CABG, DM, gout, arotic stenosis.    OT comments  Pt was shaky earlier and had EKG done.    Per RN, OK to see pt this pm. He plans home tomorrow.  Pt takes extra time; wants to be as independent as possible.  Pt did not feel up to walking to bathroom this afternoon. Will reattempt tomorrow am.    Follow Up Recommendations  No OT follow up;Supervision/Assistance - 24 hour    Equipment Recommendations  None recommended by OT    Recommendations for Other Services      Precautions / Restrictions Precautions Precautions: Knee;Fall Required Braces or Orthoses: Knee Immobilizer - Right Knee Immobilizer - Right: Discontinue once straight leg raise with < 10 degree lag Restrictions Weight Bearing Restrictions: No RLE Weight Bearing: Weight bearing as tolerated       Mobility Bed Mobility Overal bed mobility: Needs Assistance Bed Mobility: Supine to Sit     Supine to sit: Min guard Sit to supine: Min guard   General bed mobility comments: guarding.  Pt prefers to do as much as he can himself  Transfers Overall transfer level: Needs assistance Equipment used: Rolling walker (2 wheeled) Transfers: Sit to/from Stand Sit to Stand: Min guard         General transfer comment: cues for UE/LE placement    Balance                                   ADL                           Toilet Transfer: Minimal assistance;BSC;Stand-pivot   Toileting- Clothing Manipulation and Hygiene: Minimal assistance;Sit to/from stand;+2 for safety/equipment         General ADL Comments: Pt did not want to walk into bathroom this afternoon, but he did need to use the 3:1 commode.  Extra time, but pt completed transfer with min guard and min A to propr R foot up when sitting on  commode      Vision                     Perception     Praxis      Cognition   Behavior During Therapy: [facility] for tasks assessed/performed Overall Cognitive Status: Within Functional Limits for tasks assessed                       Extremity/Trunk Assessment               Exercises     Shoulder Instructions       General Comments      Pertinent Vitals/ Pain       R knee pain--not rated.  Repositioned in bed  Home Living                                          Prior Functioning/Environment              Frequency Min 2X/week     Progress Toward Goals  OT Goals(current goals can now  be found in the care plan section)  Progress towards OT goals: Progressing toward goals (slowly)     Plan Discharge plan remains appropriate    Co-evaluation                 End of Session Equipment Utilized During Treatment: Gait belt;Rolling walker;Right knee immobilizer   Activity Tolerance Patient tolerated treatment well   Patient Left in bed;with call bell/phone within reach;with family/visitor present   Nurse Communication          Time: 0867-6195 OT Time Calculation (min): 20 min  Charges: OT General Charges $OT Visit: 1 Procedure OT Treatments $Self Care/Home Management : 8-22 mins   Lesle Chris 05/26/2014, 3:30 PM Lesle Chris, OTR/L [phone] 05/26/2014

## 2014-05-26 NOTE — Progress Notes (Signed)
Physical Therapy Treatment Patient Details Name: [name] MRN: [id number] DOB: 1941-06-11 Today's Date: 05/26/2014    History of Present Illness 73 yo male s/p R TKA 05/24/14. hx o MI, CABG, DM, gout, arotic stenosis.     PT Comments    Pt reports increased pain today. Pt likely did too much on yesterday (instructed pt to slowly progress activity multiple times on yesterday however he refused to comply with therapist). Able to complete all tasks on today but pt was in significant pain. Practiced ambulation, exercises, and stair negotiation. Discussed car transfer. Pt/wife with no further questions. All education completed. Ready to d/c from PT standpoint.   Follow Up Recommendations  Home health PT;Supervision - Intermittent     Equipment Recommendations  None recommended by PT    Recommendations for Other Services OT consult     Precautions / Restrictions Precautions Precautions: Knee;Fall Required Braces or Orthoses: Knee Immobilizer - Right Knee Immobilizer - Right: Discontinue once straight leg raise with < 10 degree lag (pt able to SLR 05/26/14-dc'd KI) Restrictions Weight Bearing Restrictions: No RLE Weight Bearing: Weight bearing as tolerated    Mobility  Bed Mobility   Bed Mobility: Sit to Supine       Sit to supine: Min guard   General bed mobility comments: close guard for safety. Practiced getting onto very high bed to simulate home environment. Pt refused assistance from therapist to get leg onto bed.   Transfers                    Ambulation/Gait Ambulation/Gait assistance: Min guard Ambulation Distance (Feet): 100 Feet Assistive device: Rolling walker (2 wheeled) Gait Pattern/deviations: Step-through pattern;Antalgic;Decreased stride length     General Gait Details: slow gait speed. Pt reports increased pain today so agreeable to limit ambulation distance.    Stairs Stairs: Yes   Stair Management: One rail Left Number of Stairs:  2 General stair comments: vcs safety, technique, sequence. close guard for safety. wife present and observed proper technique.   Wheelchair Mobility    Modified Rankin (Stroke Patients Only)       Balance                                    Cognition Arousal/Alertness: Awake/alert Behavior During Therapy: WFL for tasks assessed/performed Overall Cognitive Status: Within Functional Limits for tasks assessed                      Exercises Total Joint Exercises Ankle Circles/Pumps: AROM;Both;10 reps;Supine Quad Sets: AROM;Both;10 reps;Supine Heel Slides: AAROM;Right;10 reps;Supine Hip ABduction/ADduction: AROM;Right;10 reps;Supine Straight Leg Raises: AROM;Right;10 reps;Supine Goniometric ROM: 10-45 degrees    General Comments        Pertinent Vitals/Pain 9/10 R knee. Ice applied end of session    Home Living                      Prior Function            PT Goals (current goals can now be found in the care plan section) Progress towards PT goals: Progressing toward goals (slowly-limtied by pain this session)    Frequency  7X/week    PT Plan Current plan remains appropriate    Co-evaluation             End of Session Equipment Utilized During Treatment: Gait belt Activity Tolerance: Patient  limited by pain Patient left: in bed;with call bell/phone within reach;with family/visitor present     Time: 4356-8616 PT Time Calculation (min): 23 min  Charges:  $Gait Training: 8-22 mins $Therapeutic Exercise: 8-22 mins                    G Codes:      Weston Anna, MPT Pager: [phone]

## 2014-05-27 ENCOUNTER — Inpatient Hospital Stay (HOSPITAL_COMMUNITY): Payer: Medicare Other

## 2014-05-27 DIAGNOSIS — M1710 Unilateral primary osteoarthritis, unspecified knee: Secondary | ICD-10-CM | POA: Diagnosis not present

## 2014-05-27 DIAGNOSIS — G9340 Encephalopathy, unspecified: Secondary | ICD-10-CM

## 2014-05-27 DIAGNOSIS — N183 Chronic kidney disease, stage 3 unspecified: Secondary | ICD-10-CM

## 2014-05-27 DIAGNOSIS — N189 Chronic kidney disease, unspecified: Secondary | ICD-10-CM

## 2014-05-27 DIAGNOSIS — N400 Enlarged prostate without lower urinary tract symptoms: Secondary | ICD-10-CM

## 2014-05-27 DIAGNOSIS — E119 Type 2 diabetes mellitus without complications: Secondary | ICD-10-CM

## 2014-05-27 DIAGNOSIS — I4892 Unspecified atrial flutter: Secondary | ICD-10-CM | POA: Diagnosis not present

## 2014-05-27 DIAGNOSIS — N179 Acute kidney failure, unspecified: Secondary | ICD-10-CM

## 2014-05-27 DIAGNOSIS — I359 Nonrheumatic aortic valve disorder, unspecified: Secondary | ICD-10-CM

## 2014-05-27 DIAGNOSIS — N390 Urinary tract infection, site not specified: Secondary | ICD-10-CM

## 2014-05-27 LAB — GLUCOSE, CAPILLARY
GLUCOSE-CAPILLARY: 178 mg/dL — AB (ref 70–99)
Glucose-Capillary: 190 mg/dL — ABNORMAL HIGH (ref 70–99)
Glucose-Capillary: 187 mg/dL — ABNORMAL HIGH (ref 70–99)
Glucose-Capillary: 166 mg/dL — ABNORMAL HIGH (ref 70–99)

## 2014-05-27 LAB — CBC WITH DIFFERENTIAL/PLATELET
BASOS ABS: 0 10*3/uL (ref 0.0–0.1)
BASOS PCT: 0 % (ref 0–1)
EOS ABS: 0 10*3/uL (ref 0.0–0.7)
Eosinophils Relative: 0 % (ref 0–5)
HCT: 28.2 % — ABNORMAL LOW (ref 39.0–52.0)
HEMOGLOBIN: 10.1 g/dL — AB (ref 13.0–17.0)
Lymphocytes Relative: 2 % — ABNORMAL LOW (ref 12–46)
Lymphs Abs: 0.3 10*3/uL — ABNORMAL LOW (ref 0.7–4.0)
MCH: 31.9 pg (ref 26.0–34.0)
MCHC: 35.8 g/dL (ref 30.0–36.0)
MCV: 89 fL (ref 78.0–100.0)
MONO ABS: 2 10*3/uL — AB (ref 0.1–1.0)
Monocytes Relative: 12 % (ref 3–12)
NEUTROS ABS: 14.2 10*3/uL — AB (ref 1.7–7.7)
Neutrophils Relative %: 86 % — ABNORMAL HIGH (ref 43–77)
Platelets: 104 10*3/uL — ABNORMAL LOW (ref 150–400)
RBC: 3.17 MIL/uL — ABNORMAL LOW (ref 4.22–5.81)
RDW: 14.9 % (ref 11.5–15.5)
WBC: 16.5 10*3/uL — ABNORMAL HIGH (ref 4.0–10.5)

## 2014-05-27 LAB — COMPREHENSIVE METABOLIC PANEL
ALK PHOS: 46 U/L (ref 39–117)
ALT: 10 U/L (ref 0–53)
AST: 15 U/L (ref 0–37)
Albumin: 2.7 g/dL — ABNORMAL LOW (ref 3.5–5.2)
BILIRUBIN TOTAL: 0.5 mg/dL (ref 0.3–1.2)
BUN: 50 mg/dL — AB (ref 6–23)
CHLORIDE: 96 meq/L (ref 96–112)
CO2: 18 mEq/L — ABNORMAL LOW (ref 19–32)
Calcium: 8.2 mg/dL — ABNORMAL LOW (ref 8.4–10.5)
Creatinine, Ser: 3.4 mg/dL — ABNORMAL HIGH (ref 0.50–1.35)
GFR calc Af Amer: 19 mL/min — ABNORMAL LOW (ref >90)
GFR calc non Af Amer: 16 mL/min — ABNORMAL LOW (ref >90)
Glucose, Bld: 189 mg/dL — ABNORMAL HIGH (ref 70–99)
POTASSIUM: 5 meq/L (ref 3.7–5.3)
SODIUM: 128 meq/L — AB (ref 137–147)
TOTAL PROTEIN: 5.2 g/dL — AB (ref 6.0–8.3)

## 2014-05-27 LAB — RAPID URINE DRUG SCREEN, HOSP PERFORMED
Amphetamines: NOT DETECTED
Barbiturates: NOT DETECTED
Benzodiazepines: POSITIVE — AB
Cocaine: NOT DETECTED
Opiates: POSITIVE — AB
Tetrahydrocannabinol: NOT DETECTED

## 2014-05-27 LAB — LIPID PANEL
CHOLESTEROL: 120 mg/dL (ref 0–200)
HDL: 30 mg/dL — ABNORMAL LOW (ref >39)
LDL Cholesterol: 56 mg/dL (ref 0–99)
Total CHOL/HDL Ratio: 4 RATIO
Triglycerides: 169 mg/dL — ABNORMAL HIGH (ref <150)
VLDL: 34 mg/dL (ref 0–40)

## 2014-05-27 LAB — AMMONIA: Ammonia: 28 umol/L (ref 11–60)

## 2014-05-27 LAB — HEMOGLOBIN A1C
Hgb A1c MFr Bld: 6.8 % — ABNORMAL HIGH
Mean Plasma Glucose: 148 mg/dL — ABNORMAL HIGH

## 2014-05-27 MED ORDER — ENOXAPARIN SODIUM 120 MG/0.8ML ~~LOC~~ SOLN
120.0000 mg | SUBCUTANEOUS | Status: DC
  Administered 2014-05-28: 120 mg via SUBCUTANEOUS
  Filled 2014-05-27 (×2): qty 0.8

## 2014-05-27 MED ORDER — CIPROFLOXACIN IN D5W 400 MG/200ML IV SOLN
400.0000 mg | Freq: Two times a day (BID) | INTRAVENOUS | Status: DC
  Administered 2014-05-27: 400 mg via INTRAVENOUS
  Filled 2014-05-27 (×3): qty 200

## 2014-05-27 MED ORDER — CLONAZEPAM 1 MG PO TABS
1.0000 mg | ORAL_TABLET | Freq: Every day | ORAL | Status: DC
  Administered 2014-05-27 – 2014-05-29 (×3): 1 mg via ORAL
  Filled 2014-05-27 (×3): qty 1

## 2014-05-27 MED ORDER — SODIUM CHLORIDE 0.9 % IV SOLN
INTRAVENOUS | Status: DC
  Administered 2014-05-27: 1000 mL via INTRAVENOUS
  Administered 2014-05-28 (×2): via INTRAVENOUS
  Administered 2014-05-28: 1000 mL via INTRAVENOUS
  Administered 2014-05-29: 08:00:00 via INTRAVENOUS

## 2014-05-27 MED ORDER — CIPROFLOXACIN IN D5W 400 MG/200ML IV SOLN
400.0000 mg | INTRAVENOUS | Status: DC
  Filled 2014-05-27: qty 200

## 2014-05-27 MED ORDER — ENOXAPARIN SODIUM 40 MG/0.4ML ~~LOC~~ SOLN: 40.0000 mg | SUBCUTANEOUS | Status: DC

## 2014-05-27 MED ORDER — SODIUM CHLORIDE 0.9 % IV BOLUS (SEPSIS)
500.0000 mL | Freq: Once | INTRAVENOUS | Status: AC
  Administered 2014-05-27: 500 mL via INTRAVENOUS

## 2014-05-27 NOTE — Progress Notes (Signed)
ANTIBIOTIC CONSULT NOTE - INITIAL  Pharmacy Consult for Cipro Indication: UTI  Allergies  Allergen Reactions  . Actos [Pioglitazone Hydrochloride] Swelling  . Penicillins Other (See Comments)    Serum sickness, swelling  . Amiodarone Other (See Comments)    diarrhea  . Latex Swelling  . Codeine     Makes Pt aggressive  . Depakote [Divalproex Sodium] Diarrhea    severe  . Loop Diuretics     Spike in BUN levels  . Losartan Other (See Comments)  . Nsaids     Renal problems  . Orudis [Ketoprofen] Other (See Comments)    Cannot take due to renal insufficiency  . Pentazocine Lactate   . Poison Ivy Extract [employer] Poison Ivy]   . Rozerem [Ramelteon] Diarrhea    Severe   . Sertraline Hcl     Pt not sure  . Benazepril Hcl     ? Possible lowers plateletes  . Indomethacin Other (See Comments)    Renal problems   . Minocycline Other (See Comments)    Makes dizzy and feel just lousy.  . Potassium Iodide Rash    Itching   . Shellfish Allergy Rash    Patient Measurements: Height: 6' (182.9 cm) Weight: 264 lb (119.75 kg) IBW/kg (Calculated) : 77.6 Adjusted Body Weight:   Vital Signs: Temp: 100 F (37.8 C) (06/04 0014) Temp src: Oral (06/04 0014) BP: 103/63 mmHg (06/04 0014) Pulse Rate: 76 (06/04 0014) Intake/Output from previous day: 06/03 0701 - 06/04 0700 In: 240 [P.O.:240] Out: -  Intake/Output from this shift:    Labs:  Recent Labs  05/25/14 0436 05/26/14 0519 05/27/14 0015  WBC 9.5 9.2 16.5*  HGB 12.1* 10.4* 10.1*  PLT 126* 121* PENDING  CREATININE 2.11* 2.33* 3.40*   Estimated Creatinine Clearance: 25.5 ml/min (by C-G formula based on Cr of 3.4). No results found for this basename: VANCOTROUGH, Corlis Leak, VANCORANDOM, GENTTROUGH, GENTPEAK, GENTRANDOM, TOBRATROUGH, TOBRAPEAK, TOBRARND, AMIKACINPEAK, AMIKACINTROU, AMIKACIN,  in the last 72 hours   Microbiology: Recent Results (from the past 720 hour(s))  SURGICAL PCR SCREEN     Status: None   Collection Time    05/18/14  2:40 PM      Result Value Ref Range Status   MRSA, PCR NEGATIVE  NEGATIVE Final   Staphylococcus aureus NEGATIVE  NEGATIVE Final   Comment:            The Xpert SA Assay (FDA     approved for NASAL specimens     in patients over 35 years of age),     is one component of     a comprehensive surveillance     program.  Test performance has     been validated by [employer] for patients greater     than or equal to 58 year old.     It is not intended     to diagnose infection nor to     guide or monitor treatment.    Medical History: Past Medical History  Diagnosis Date  . Coronary heart disease     stent, CABG, RBBB  . Valvular heart disease     aortic stenosis/regurgitation  . Sarcoid 2011    pulmonary and bone marrow  . Hypercalcemia 2011    due to sarcoidiosis  . Bone marrow disease   . Diabetes mellitus   . Hemorrhagic cystitis 2012  . Gout   . HTN (hypertension)   . A-fib     controlled  with medication  . Myocardial infarction     1995, 2002, 2006  . OSA (obstructive sleep apnea)     use bipap setting of 10 and 12  . Complication of anesthesia 11-16-13    required alot of versed per anesthesia with cataract surgery  . Chronic kidney disease (CKD), stage III (moderate)     lov note dr Koleen Nimrod nephrology 09-17-13 on chart  . Heart murmur   . Atrial flutter     hx of  . CHF (congestive heart failure)   . Pneumonia 1966, 2011    hx of  . Anxiety   . Osteoarthritis   . DDD (degenerative disc disease)   . Synovial cyst of lumbar spine     l3-l4, l4-l5, injections  . Anemia   . Hepatitis     mono  . Cancer     basal cell  . Right sciatic nerve pain     for block thursday 01-21-2014, injections  . Fungal toenail infection   . Biceps tendon tear     right  . Diabetes     Medications:  Anti-infectives   Start     Dose/Rate Route Frequency Ordered Stop   05/27/14 1000  ciprofloxacin (CIPRO) IVPB 400 mg     400 mg 200  mL/hr over 60 Minutes Intravenous 2 times daily 05/27/14 0100     05/26/14 2200  ciprofloxacin (CIPRO) tablet 500 mg  Status:  Discontinued     500 mg Oral 2 times daily 05/26/14 2158 05/26/14 2350   05/25/14 0100  vancomycin (VANCOCIN) IVPB 1000 mg/200 mL premix     1,000 mg 200 mL/hr over 60 Minutes Intravenous Every 12 hours 05/24/14 1615 05/25/14 0236   05/24/14 0600  vancomycin (VANCOCIN) 1,500 mg in sodium chloride 0.9 % 500 mL IVPB     1,500 mg 250 mL/hr over 120 Minutes Intravenous On call to O.R. 05/23/14 1848 05/24/14 1306     Assessment: Patient with UTI.  First dose of antibiotics already given.  Goal of Therapy:  Cipro dosed based on patient weight and renal function   Plan:  Follow up culture results Cipro 400mg  iv q12hr  Shea Stakes Crowford Tyler Deis. 05/27/2014,1:06 AM

## 2014-05-27 NOTE — Progress Notes (Signed)
Pt already on BIPAP when RT arrived to room.  Current settings are 12/10 per home settings, pt tolerating well at this time.  RT to monitor and assess as needed.

## 2014-05-27 NOTE — Progress Notes (Signed)
ANTICOAGULATION CONSULT NOTE - Initial Consult  Pharmacy Consult for lovenox Indication: atrial fibrillation  Allergies  Allergen Reactions  . Actos [Pioglitazone Hydrochloride] Swelling  . Penicillins Other (See Comments)    Serum sickness, swelling  . Amiodarone Other (See Comments)    diarrhea  . Latex Swelling  . Codeine     Makes Pt aggressive  . Depakote [Divalproex Sodium] Diarrhea    severe  . Loop Diuretics     Spike in BUN levels  . Losartan Other (See Comments)  . Nsaids     Renal problems  . Orudis [Ketoprofen] Other (See Comments)    Cannot take due to renal insufficiency  . Pentazocine Lactate   . Poison Ivy Extract [employer] Poison Ivy]   . Rozerem [Ramelteon] Diarrhea    Severe   . Sertraline Hcl     Pt not sure  . Benazepril Hcl     ? Possible lowers plateletes  . Indomethacin Other (See Comments)    Renal problems   . Minocycline Other (See Comments)    Makes dizzy and feel just lousy.  . Potassium Iodide Rash    Itching   . Shellfish Allergy Rash    Patient Measurements: Height: 5\' 11"  (180.3 cm) Weight: 268 lb 1.6 oz (121.609 kg) IBW/kg (Calculated) : 75.3   Vital Signs: Temp: 99.3 F (37.4 C) (06/04 1420) Temp src: Oral (06/04 1420) BP: 156/54 mmHg (06/04 1420) Pulse Rate: 69 (06/04 1420)  Labs:  Recent Labs  05/25/14 0436 05/26/14 0519 05/27/14 0015  HGB 12.1* 10.4* 10.1*  HCT 33.7* 30.5* 28.2*  PLT 126* 121* 104*  CREATININE 2.11* 2.33* 3.40*    Estimated Creatinine Clearance: 25.3 ml/min (by C-G formula based on Cr of 3.4).   Medical History: Past Medical History  Diagnosis Date  . Coronary heart disease     stent, CABG, RBBB  . Valvular heart disease     aortic stenosis/regurgitation  . Sarcoid 2011    pulmonary and bone marrow  . Hypercalcemia 2011    due to sarcoidiosis  . Bone marrow disease   . Diabetes mellitus   . Hemorrhagic cystitis 2012  . Gout   . HTN (hypertension)   . A-fib     controlled  with medication  . Myocardial infarction     1995, 2002, 2006  . OSA (obstructive sleep apnea)     use bipap setting of 10 and 12  . Complication of anesthesia 11-16-13    required alot of versed per anesthesia with cataract surgery  . Chronic kidney disease (CKD), stage III (moderate)     lov note dr Koleen Nimrod nephrology 09-17-13 on chart  . Heart murmur   . Atrial flutter     hx of  . CHF (congestive heart failure)   . Pneumonia 1966, 2011    hx of  . Anxiety   . Osteoarthritis   . DDD (degenerative disc disease)   . Synovial cyst of lumbar spine     l3-l4, l4-l5, injections  . Anemia   . Hepatitis     mono  . Cancer     basal cell  . Right sciatic nerve pain     for block thursday 01-21-2014, injections  . Fungal toenail infection   . Biceps tendon tear     right  . Diabetes     Assessment: 63 YOM s/p R-TKA on 6/1 at [facility], transferred to [facility] on 6/4 for possible stroke. CT head - No acute intracranial findings.  Pt. Is on xarelto 15 mg daily for hx of Afib, has been on xarelto 10mg  daily since after surgery for VTE px. Originally plan to resume xarelto 15 mg daily after discharge. Now he is having acute on chronic renal failure. Scr 2.21 > 3.4, est. crcl ~ 15-20 ml/min. Discussed with Coralyn Pear, he is not a candidate for xarelto due to acute renal failure. Will put him on full dose lovenox with renal dose adjustment for now. hgb 10.1, plt 104, trending down. Wt = 121 kg. Last xarelto dose was this morning at 1130  Also noted he is on D#2 cipro for UTI. Urine and blood cultures are pending  Goal of Therapy:  Anti-Xa level 0.6-1 units/ml 4hrs after LMWH dose given Monitor platelets by anticoagulation protocol: Yes   Plan:  - d/c xarelto - Lovenox 120 mg SQ Q 24 hrs from tomorrow - f/u renal function and plans for resuming oral anticoagulation. - Adjust cipro to 400mg  IV Q 24 hrs - f/u cultures  Maryanna Shape, PharmD, BCPS  Clinical Pharmacist  Pager: [phone]   05/27/2014,3:09  PM

## 2014-05-27 NOTE — Progress Notes (Signed)
Subjective: 3 Days Post-Op Procedure(s) (LRB): RIGHT TOTAL KNEE ARTHROPLASTY (Right) Patient reports pain as moderate in the knee today.  He had difficulty standing to void last night while he was weak and attributed some of the difficulty to the knee pain.  He denies any complaints this morning except for the knee.  He gives a history of a biceps tendon rupture in the right arm years ago with some slight persistent weakness in that upper extremity.   Patient seen in rounds for Dr. [name].  Events of last night were reviewed with the night shift RN at [facility].  Seen by hospitalist service and then by Neuro.  Transferred to [facility] for further evaluation and testing.  Just returned to his room at this time from having the MRI/MRA performed.  Workup ongoing.  Resume his total knee protocol and therapy if okay with Neuro.  Up to chair with assistance.  Spoke to the staff about importance of continued icing of the knee for his pain and swelling. Patient is having problems with pain in the knee, requiring pain medications Plan is to go Home after hospital stay.  Objective: Vital signs in last 24 hours: Temp:  [97.5 F (36.4 C)-102 F (38.9 C)] 100.2 F (37.9 C) (06/04 0800) Pulse Rate:  [64-107] 64 (06/04 0800) Resp:  [16-24] 24 (06/04 0800) BP: (96-204)/(38-80) 156/66 mmHg (06/04 0800) SpO2:  [93 %-98 %] 95 % (06/04 0800) Weight:  [121.609 kg (268 lb 1.6 oz)] 121.609 kg (268 lb 1.6 oz) (06/04 0200)  Intake/Output from previous day:  Intake/Output Summary (Last 24 hours) at 05/27/14 1040 Last data filed at 05/27/14 0230  Gross per 24 hour  Intake      0 ml  Output     75 ml  Net    -75 ml    Labs:  Recent Labs  05/25/14 0436 05/26/14 0519 05/27/14 0015  HGB 12.1* 10.4* 10.1*    Recent Labs  05/26/14 0519 05/27/14 0015  WBC 9.2 16.5*  RBC 3.42* 3.17*  HCT 30.5* 28.2*  PLT 121* 104*    Recent Labs  05/26/14 0519 05/27/14 0015  NA 134* 128*  K 4.9 5.0  CL 103 96    CO2 20 18*  BUN 45* 50*  CREATININE 2.33* 3.40*  GLUCOSE 177* 189*  CALCIUM 8.6 8.2*   No results found for this basename: LABPT, INR,  in the last 72 hours  EXAM General - Patient is Alert, Appropriate and Oriented Extremity - Neurovascular intact Sensation intact distally Dorsiflexion/Plantar flexion intact No cellulitis present Thigh is soft but sore too compression due to the operative tourniquet. Dressing/Incision - clean, dry, no drainage, healing Motor Function - intact, moving foot and toes well on exam.   Past Medical History  Diagnosis Date  . Coronary heart disease     stent, CABG, RBBB  . Valvular heart disease     aortic stenosis/regurgitation  . Sarcoid 2011    pulmonary and bone marrow  . Hypercalcemia 2011    due to sarcoidiosis  . Bone marrow disease   . Diabetes mellitus   . Hemorrhagic cystitis 2012  . Gout   . HTN (hypertension)   . A-fib     controlled with medication  . Myocardial infarction     1995, 2002, 2006  . OSA (obstructive sleep apnea)     use bipap setting of 10 and 12  . Complication of anesthesia 11-16-13    required alot of versed per anesthesia with  cataract surgery  . Chronic kidney disease (CKD), stage III (moderate)     lov note dr Koleen Nimrod nephrology 09-17-13 on chart  . Heart murmur   . Atrial flutter     hx of  . CHF (congestive heart failure)   . Pneumonia 1966, 2011    hx of  . Anxiety   . Osteoarthritis   . DDD (degenerative disc disease)   . Synovial cyst of lumbar spine     l3-l4, l4-l5, injections  . Anemia   . Hepatitis     mono  . Cancer     basal cell  . Right sciatic nerve pain     for block thursday 01-21-2014, injections  . Fungal toenail infection   . Biceps tendon tear     right  . Diabetes     Assessment/Plan: 3 Days Post-Op Procedure(s) (LRB): RIGHT TOTAL KNEE ARTHROPLASTY (Right) Principal Problem:   OA (osteoarthritis) of knee Active Problems:   Sarcoidosis   OBSTRUCTIVE SLEEP  APNEA   MYOCARDIAL INFARCTION   CORONARY HEART DISEASE   AORTIC STENOSIS   VALVULAR HEART DISEASE   Hx of CABG   Diabetes   Gout   BPH (benign prostatic hyperplasia)   Benign hypertensive heart disease without heart failure   Chronic kidney disease (CKD), stage III (moderate)   Weakness   Fever  Estimated body mass index is 37.41 kg/(m^2) as calculated from the following:   Height as of this encounter: 5\' 11"  (1.803 m).   Weight as of this encounter: 121.609 kg (268 lb 1.6 oz).  Up with therapy when okay from a Neuro standpoint. DVT Prophylaxis - Xarelto 10 mg for DVT prophylax.  Hospitalist/Internal Medicine mentioned possibility of change to Coumadin/Heparin is worsening creatinine.  He is on Xarelto 15 mg dosing at home due to the CRF Stage III.  If needs to be switched, will leave that up to medicine and neuro. Placed on Cipro for bacteria.  Urine culture pending at this point. Blood cultures and enzymes ordered as per Medicine note.  Troponins pending. Weight-Bearing as tolerated to right leg.  Arlee Muslim, PA-C Orthopaedic Surgery 05/27/2014, 10:40 AM

## 2014-05-27 NOTE — Progress Notes (Signed)
Pt reports decreased urination and difficulty voiding x2 days. Pt has hx of CKD stage III.  Bladder scan showed less than 50cc in bladder. Md notified. Will continue to monitor.

## 2014-05-27 NOTE — Consult Note (Addendum)
Referring Physician: Dr. Hal Hope    Chief Complaint: right arm weakness, confusion  HPI:                                                                                                                                         [name] is an 73 y.o. male,right handed, with a past medical history significant for HTN, DM type II, CAD s/p CABG and stenting, MI, chronic congestive heart failure, aortic valve replacement tissue valve, atrial fibrillation on xarelto, OSA, sarcoidosis, who had right knee surgery 2 days ago at [facility] and last night around 11 pm was noted to have right arm weakness and confusion. As per chart review,when the evening shift began the nurse found the patient was sleeping and later was found to be confused and on exam he was " much weaker than he was at 6 am" yesterday. Patient's nurse found that patient was more weak on the left upper extremity. At the time of initial MD evaluation patient patient was noted to have mild right arm drift but was alert, awake, and oriented. [name] had an urgent CT brain that revealed no acute abnormality. UA showed features concerning for UTI. Presently, he denies HA, vertigo, double vision, focal weakness, slurred speech, language or visual disturbances. Date last known well: 05/26/14 Time last known well: 6 am tPA Given: no, out of the window, on xarelto, recent surgery. NIHSS: no performed at time initial assessment.   Past Medical History  Diagnosis Date  . Coronary heart disease     stent, CABG, RBBB  . Valvular heart disease     aortic stenosis/regurgitation  . Sarcoid 2011    pulmonary and bone marrow  . Hypercalcemia 2011    due to sarcoidiosis  . Bone marrow disease   . Diabetes mellitus   . Hemorrhagic cystitis 2012  . Gout   . HTN (hypertension)   . A-fib     controlled with medication  . Myocardial infarction     1995, 2002, 2006  . OSA (obstructive sleep apnea)     use bipap setting of 10 and 12  .  Complication of anesthesia 11-16-13    required alot of versed per anesthesia with cataract surgery  . Chronic kidney disease (CKD), stage III (moderate)     lov note dr Koleen Nimrod nephrology 09-17-13 on chart  . Heart murmur   . Atrial flutter     hx of  . CHF (congestive heart failure)   . Pneumonia 1966, 2011    hx of  . Anxiety   . Osteoarthritis   . DDD (degenerative disc disease)   . Synovial cyst of lumbar spine     l3-l4, l4-l5, injections  . Anemia   . Hepatitis     mono  . Cancer     basal cell  . Right sciatic nerve pain     for  block thursday 01-21-2014, injections  . Fungal toenail infection   . Biceps tendon tear     right  . Diabetes     Past Surgical History  Procedure Laterality Date  . Cataract surgery Right 2007  . Prostate surgery  oct. 2012    TURP  . Vasectomy  1977  . Redo median sternotomy, extracorporeal cirulation, avr, tricuspid valve repair  06/13/2011    AVR(23-mm Edwards pericardial Magna-Ease valve./ TVrepair (34-mm Edwards MC3 annuloplasty ring  . Coronary artery bypass graft  2006    x 5  . Cataract surgery Left 11-16-13  . Cardioversion N/A 12/30/2013    Procedure: CARDIOVERSION;  Surgeon: [name], MD;  Location: [facility] ENDOSCOPY;  Service: Cardiovascular;  Laterality: N/A;  10:49 cardioversion at 120 joules, then 150 joules, to SB  used  Lido 29m,  Propofol 160 mcg  . Colonoscopy N/A 01/29/2014    Procedure: COLONOSCOPY;  Surgeon: JWinfield Cunas, MD;  [facility];  Service: Endoscopy;  Laterality: N/A;  amanda//ja  . Cardioversion  2003, 2006, 2012, 2013  . Portacath placement    . Portacath removed    . Basal cell carcinoma excision Right 2015    ear  . Total knee arthroplasty Right 05/24/2014    Procedure: RIGHT TOTAL KNEE ARTHROPLASTY;  Surgeon: FGearlean Alf MD;  Location: [facility] ORS;  Service: Orthopedics;  Laterality: Right;    Family History  Problem Relation Age of Onset  . Heart attack Father   . Allergies     . Asthma    . Heart disease    . Cancer     Social History:  reports that he quit smoking about 18 years ago. His smoking use included Pipe and Cigars. He has never used smokeless tobacco. He reports that he drinks alcohol. He reports that he does not use illicit drugs.  Allergies:  Allergies  Allergen Reactions  . Actos [Pioglitazone Hydrochloride] Swelling  . Penicillins Other (See Comments)    Serum sickness, swelling  . Amiodarone Other (See Comments)    diarrhea  . Latex Swelling  . Codeine     Makes Pt aggressive  . Depakote [Divalproex Sodium] Diarrhea    severe  . Loop Diuretics     Spike in BUN levels  . Losartan Other (See Comments)  . Nsaids     Renal problems  . Orudis [Ketoprofen] Other (See Comments)    Cannot take due to renal insufficiency  . Pentazocine Lactate   . Poison Ivy Extract [Sealed Air CorporationOf Poison Ivy]   . Rozerem [Ramelteon] Diarrhea    Severe   . Sertraline Hcl     Pt not sure  . Benazepril Hcl     ? Possible lowers plateletes  . Indomethacin Other (See Comments)    Renal problems   . Minocycline Other (See Comments)    Makes dizzy and feel just lousy.  . Potassium Iodide Rash    Itching   . Shellfish Allergy Rash    Medications:  I have reviewed the patient's current medications.  ROS:                                                                                                                                       History obtained from the patient and chart review.  General ROS: negative for - chills, fatigue, fever, night sweats, or weight loss Psychological ROS: negative for - behavioral disorder, hallucinations, memory difficulties, mood swings or suicidal ideation Ophthalmic ROS: negative for - blurry vision, double vision, eye pain or loss of vision ENT ROS: negative for - epistaxis, nasal discharge, oral  lesions, sore throat, tinnitus or vertigo Allergy and Immunology ROS: negative for - hives or itchy/watery eyes Hematological and Lymphatic ROS: negative for - bleeding problems, bruising or swollen lymph nodes Endocrine ROS: negative for - galactorrhea, hair pattern changes, polydipsia/polyuria or temperature intolerance Respiratory ROS: negative for - cough, hemoptysis, shortness of breath or wheezing Cardiovascular ROS: negative for - chest pain, dyspnea on exertion, edema or irregular heartbeat Gastrointestinal ROS: negative for - abdominal pain, diarrhea, hematemesis, nausea/vomiting or stool incontinence Genito-Urinary ROS: negative for - dysuria, hematuria, incontinence or urinary frequency/urgency Musculoskeletal ROS: significant for - joint swelling right leg Neurological ROS: as noted in HPI Dermatological ROS: negative for rash and skin lesion changes  Physical exam: pleasant male in no apparent distress. Blood pressure 96/48, pulse 77, temperature 98.7 F (37.1 C), temperature source Oral, resp. rate 20, height 5' 11"  (1.803 m), weight 121.609 kg (268 lb 1.6 oz), SpO2 98.00%. Head: normocephalic. Neck: supple, no bruits, no JVD. Cardiac: no murmurs. Lungs: clear. Abdomen: soft, no tender, no mass. Extremities: Dressing right knee, right LE edema. Neurologic Examination:                                                                                                      Mental Status: Alert, oriented, thought content appropriate.  Speech fluent without evidence of aphasia.  Able to follow 3 step commands without difficulty. Cranial Nerves: II: Discs flat bilaterally; Visual fields grossly normal, pupils equal, round, reactive to light and accommodation III,IV, VI: ptosis not present, extra-ocular motions intact bilaterally V,VII: smile symmetric, facial light touch sensation normal bilaterally VIII: hearing normal bilaterally IX,X: gag reflex present XI: bilateral shoulder  shrug XII: midline tongue extension without atrophy or fasciculations Motor: There is mild weakness right UE but patient said it is due to " a rotator cuff problem". Right LE with limited movements due to recent surgery. Tone  and bulk:normal tone throughout; no atrophy noted Sensory: Pinprick and light touch intact throughout, bilaterally Deep Tendon Reflexes:  1 all over Plantars: Right: downgoing   Left: downgoing Cerebellar: normal finger-to-nose,  normal heel-to-shin test Gait:  No tested.    Results for orders placed during the hospital encounter of 05/24/14 (from the past 48 hour(s))  CBC     Status: Abnormal   Collection Time    05/25/14  4:36 AM      Result Value Ref Range   WBC 9.5  4.0 - 10.5 K/uL   RBC 3.78 (*) 4.22 - 5.81 MIL/uL   Hemoglobin 12.1 (*) 13.0 - 17.0 g/dL   HCT 33.7 (*) 39.0 - 52.0 %   MCV 89.2  78.0 - 100.0 fL   MCH 32.0  26.0 - 34.0 pg   MCHC 35.9  30.0 - 36.0 g/dL   RDW 15.0  11.5 - 15.5 %   Platelets 126 (*) 150 - 400 K/uL  BASIC METABOLIC PANEL     Status: Abnormal   Collection Time    05/25/14  4:36 AM      Result Value Ref Range   Sodium 132 (*) 137 - 147 mEq/L   Potassium 5.6 (*) 3.7 - 5.3 mEq/L   Chloride 102  96 - 112 mEq/L   CO2 18 (*) 19 - 32 mEq/L   Glucose, Bld 218 (*) 70 - 99 mg/dL   BUN 39 (*) 6 - 23 mg/dL   Creatinine, Ser 2.11 (*) 0.50 - 1.35 mg/dL   Calcium 8.4  8.4 - 10.5 mg/dL   GFR calc non Af Amer 29 (*) >90 mL/min   GFR calc Af Amer 34 (*) >90 mL/min   Comment: (NOTE)     The eGFR has been calculated using the CKD EPI equation.     This calculation has not been validated in all clinical situations.     eGFR's persistently <90 mL/min signify possible Chronic Kidney     Disease.  GLUCOSE, CAPILLARY     Status: Abnormal   Collection Time    05/25/14  7:33 AM      Result Value Ref Range   Glucose-Capillary 177 (*) 70 - 99 mg/dL  GLUCOSE, CAPILLARY     Status: Abnormal   Collection Time    05/25/14  9:47 AM       Result Value Ref Range   Glucose-Capillary 207 (*) 70 - 99 mg/dL  GLUCOSE, CAPILLARY     Status: Abnormal   Collection Time    05/25/14 11:42 AM      Result Value Ref Range   Glucose-Capillary 238 (*) 70 - 99 mg/dL  GLUCOSE, CAPILLARY     Status: Abnormal   Collection Time    05/25/14  5:35 PM      Result Value Ref Range   Glucose-Capillary 309 (*) 70 - 99 mg/dL  GLUCOSE, CAPILLARY     Status: Abnormal   Collection Time    05/25/14  8:58 PM      Result Value Ref Range   Glucose-Capillary 292 (*) 70 - 99 mg/dL  CBC     Status: Abnormal   Collection Time    05/26/14  5:19 AM      Result Value Ref Range   WBC 9.2  4.0 - 10.5 K/uL   RBC 3.42 (*) 4.22 - 5.81 MIL/uL   Hemoglobin 10.4 (*) 13.0 - 17.0 g/dL   HCT 30.5 (*) 39.0 - 52.0 %   MCV 89.2  78.0 - 100.0 fL   MCH 30.4  26.0 - 34.0 pg   MCHC 34.1  30.0 - 36.0 g/dL   RDW 15.1  11.5 - 15.5 %   Platelets 121 (*) 150 - 400 K/uL  BASIC METABOLIC PANEL     Status: Abnormal   Collection Time    05/26/14  5:19 AM      Result Value Ref Range   Sodium 134 (*) 137 - 147 mEq/L   Potassium 4.9  3.7 - 5.3 mEq/L   Chloride 103  96 - 112 mEq/L   CO2 20  19 - 32 mEq/L   Glucose, Bld 177 (*) 70 - 99 mg/dL   BUN 45 (*) 6 - 23 mg/dL   Creatinine, Ser 2.33 (*) 0.50 - 1.35 mg/dL   Calcium 8.6  8.4 - 10.5 mg/dL   GFR calc non Af Amer 26 (*) >90 mL/min   GFR calc Af Amer 30 (*) >90 mL/min   Comment: (NOTE)     The eGFR has been calculated using the CKD EPI equation.     This calculation has not been validated in all clinical situations.     eGFR's persistently <90 mL/min signify possible Chronic Kidney     Disease.  GLUCOSE, CAPILLARY     Status: Abnormal   Collection Time    05/26/14  7:21 AM      Result Value Ref Range   Glucose-Capillary 164 (*) 70 - 99 mg/dL  GLUCOSE, CAPILLARY     Status: Abnormal   Collection Time    05/26/14  9:58 AM      Result Value Ref Range   Glucose-Capillary 173 (*) 70 - 99 mg/dL  GLUCOSE, CAPILLARY      Status: Abnormal   Collection Time    05/26/14 12:30 PM      Result Value Ref Range   Glucose-Capillary 172 (*) 70 - 99 mg/dL  GLUCOSE, CAPILLARY     Status: Abnormal   Collection Time    05/26/14  5:00 PM      Result Value Ref Range   Glucose-Capillary 191 (*) 70 - 99 mg/dL  URINALYSIS, ROUTINE W REFLEX MICROSCOPIC     Status: Abnormal   Collection Time    05/26/14  6:32 PM      Result Value Ref Range   Color, Urine YELLOW  YELLOW   APPearance CLOUDY (*) CLEAR   Specific Gravity, Urine 1.023  1.005 - 1.030   pH 5.0  5.0 - 8.0   Glucose, UA 100 (*) NEGATIVE mg/dL   Hgb urine dipstick SMALL (*) NEGATIVE   Bilirubin Urine SMALL (*) NEGATIVE   Ketones, ur NEGATIVE  NEGATIVE mg/dL   Protein, ur >300 (*) NEGATIVE mg/dL   Urobilinogen, UA 0.2  0.0 - 1.0 mg/dL   Nitrite NEGATIVE  NEGATIVE   Leukocytes, UA MODERATE (*) NEGATIVE  URINE MICROSCOPIC-ADD ON     Status: Abnormal   Collection Time    05/26/14  6:32 PM      Result Value Ref Range   Squamous Epithelial / LPF RARE  RARE   WBC, UA 21-50  <3 WBC/hpf   RBC / HPF 3-6  <3 RBC/hpf   Bacteria, UA FEW (*) RARE   Casts HYALINE CASTS (*) NEGATIVE  GLUCOSE, CAPILLARY     Status: Abnormal   Collection Time    05/26/14  9:27 PM      Result Value Ref Range   Glucose-Capillary 226 (*) 70 - 99 mg/dL  COMPREHENSIVE METABOLIC PANEL     Status: Abnormal   Collection Time    05/27/14 12:15 AM      Result Value Ref Range   Sodium 128 (*) 137 - 147 mEq/L   Potassium 5.0  3.7 - 5.3 mEq/L   Chloride 96  96 - 112 mEq/L   CO2 18 (*) 19 - 32 mEq/L   Glucose, Bld 189 (*) 70 - 99 mg/dL   BUN 50 (*) 6 - 23 mg/dL   Creatinine, Ser 3.40 (*) 0.50 - 1.35 mg/dL   Calcium 8.2 (*) 8.4 - 10.5 mg/dL   Total Protein 5.2 (*) 6.0 - 8.3 g/dL   Albumin 2.7 (*) 3.5 - 5.2 g/dL   AST 15  0 - 37 U/L   ALT 10  0 - 53 U/L   Alkaline Phosphatase 46  39 - 117 U/L   Total Bilirubin 0.5  0.3 - 1.2 mg/dL   GFR calc non Af Amer 16 (*) >90 mL/min   GFR calc Af  Amer 19 (*) >90 mL/min   Comment: (NOTE)     The eGFR has been calculated using the CKD EPI equation.     This calculation has not been validated in all clinical situations.     eGFR's persistently <90 mL/min signify possible Chronic Kidney     Disease.  AMMONIA     Status: None   Collection Time    05/27/14 12:15 AM      Result Value Ref Range   Ammonia 28  11 - 60 umol/L  CBC WITH DIFFERENTIAL     Status: Abnormal   Collection Time    05/27/14 12:15 AM      Result Value Ref Range   WBC 16.5 (*) 4.0 - 10.5 K/uL   RBC 3.17 (*) 4.22 - 5.81 MIL/uL   Hemoglobin 10.1 (*) 13.0 - 17.0 g/dL   HCT 28.2 (*) 39.0 - 52.0 %   MCV 89.0  78.0 - 100.0 fL   MCH 31.9  26.0 - 34.0 pg   MCHC 35.8  30.0 - 36.0 g/dL   RDW 14.9  11.5 - 15.5 %   Platelets 104 (*) 150 - 400 K/uL   Comment: SPECIMEN CHECKED FOR CLOTS     PLATELET COUNT CONFIRMED BY SMEAR   Neutrophils Relative % 86 (*) 43 - 77 %   Lymphocytes Relative 2 (*) 12 - 46 %   Monocytes Relative 12  3 - 12 %   Eosinophils Relative 0  0 - 5 %   Basophils Relative 0  0 - 1 %   Neutro Abs 14.2 (*) 1.7 - 7.7 K/uL   Lymphs Abs 0.3 (*) 0.7 - 4.0 K/uL   Monocytes Absolute 2.0 (*) 0.1 - 1.0 K/uL   Eosinophils Absolute 0.0  0.0 - 0.7 K/uL   Basophils Absolute 0.0  0.0 - 0.1 K/uL   Smear Review MORPHOLOGY UNREMARKABLE     Ct Head Wo Contrast  05/27/2014   CLINICAL DATA:  Upper extremity weakness.  EXAM: CT HEAD WITHOUT CONTRAST  TECHNIQUE: Contiguous axial images were obtained from the base of the skull through the vertex without intravenous contrast.  COMPARISON:  05/09/2012  FINDINGS: Skull and Sinuses:Negative for fracture or destructive process.  Orbits: Bilateral cataract resection.  Brain: No evidence of acute abnormality, such as acute infarction, hemorrhage, hydrocephalus, or mass lesion/mass effect. Generalized brain atrophy, stable from 2013. Incidental choroid plexus xanthogranulomas.  IMPRESSION: No acute intracranial findings.    Electronically Signed  By: Jorje Guild M.D.   On: 05/27/2014 00:07   Dg Chest Port 1 View  05/27/2014   CLINICAL DATA:  Fever, lethargy.  EXAM: PORTABLE CHEST - 1 VIEW  COMPARISON:  05/18/2014.  FINDINGS: Chronic cardiopericardial enlargement. Mediastinal contours are distorted by rightward rotation. Prominent interstitial markings, likely from interstitial crowding given low lung volumes. No definitive pneumonia. No effusion or pneumothorax. Left axillary surgical clips.  IMPRESSION: 1. No evidence of pneumonia. 2. Low lung volumes, which decreases diagnostic sensitivity.   Electronically Signed   By: Jorje Guild M.D.   On: 05/27/2014 00:09    Assessment: 73 y.o. male with multiple risk factors for stroke including atrial fibrillation on xarelto, who last night was noted to have weakness right arm and confusion. No a candidate for thrombolysis as he was out of the window, is on xarelto, and had right knee surgery 05/24/14. Possible left brain infarct. Agree with completing stroke work up. Will follow up.  Stroke Risk Factors - HTN, DM, CAD,chronic congestive heart failure, atrial fibrillation  Plan: 1. HgbA1c, fasting lipid panel 2. MRI, MRA  of the brain without contrast 3. Echocardiogram 4. Carotid dopplers 5. Prophylactic therapy-xarelto 6. Risk factor modification 7. Telemetry monitoring 8. Frequent neuro checks 9. PT/OT SLP  Dorian Pod, MD [facility] [phone]  05/27/2014, 2:20 AM

## 2014-05-27 NOTE — Progress Notes (Signed)
Dr. Hal Hope evaluated patient at bedside and ordered a stat CT, chest xray, and blood cultures before transferring the patient to [facility].  I transported the patient to CT/x-ray and upon returning to the patient's room called the rapid response nurse to evaluate patient.  Rapid response nurse and house supervisor assessed patient while report was called to [facility] and [facility].  [facility] picked up the patient and transported him to [facility].  Patient's wife was called and updated on the patient's condition and given his new room number.

## 2014-05-27 NOTE — Care Management Note (Unsigned)
    Page 1 of 1   05/27/2014     2:46:04 PM CARE MANAGEMENT NOTE 05/27/2014  Patient:  [name], [name]   Account Number:  [id number]  Date Initiated:  05/27/2014  Documentation initiated by:  Lorne Skeens  Subjective/Objective Assessment:   Patient was admitted for a right total knee arthroplasty, developed stroke-like symptoms while admitted. Lives at home with spouse.  Was previously set up with [facility] for PT.     Action/Plan:   Will follow for discharge needs pending PT/OT evals and physician orders.   Anticipated DC Date:     Anticipated DC Plan:  [facility] referral  Clinical Social Worker      DC Planning Services  CM consult      Choice offered to / List presented to:  C-1 Patient           Status of service:   Medicare Important Message given?  YES (If response is "NO", the following Medicare IM given date fields will be blank) Date Medicare IM given:  05/18/2014 Date Additional Medicare IM given:  05/27/2014  Discharge Disposition:    Per UR Regulation:  Reviewed for med. necessity/level of care/duration of stay  If discussed at [place] of Stay Meetings, dates discussed:    Comments:  05/27/14 [name], MSN, CM- Met with patient to discuss home health orders. Patient states that since the stroke-like event, he and his wife may decide to discharge to [facility] instead.  Patient stated that this has already been arranged.  Current PT/OT recommendations are for [facility], but patient has not been seen since this new event.  Message was left for CSW to inform him of patient's wishes for [facility].  CM will continue to follow for determination of final discharge disposition. Mary with [facility] is aware of patient and will also follow. Medicare IM letter provided.

## 2014-05-27 NOTE — Progress Notes (Addendum)
05/27/14 0903  PT R-evaluation Visit Information  Last PT Received On 05/27/14  Assistance Needed +2  History of Present Illness 73 yo male s/p R TKA 05/24/14 pt transferred to [facility] 6/3 secondary to new onset of generalized weakness. Stroke workup underway . hx o MI, CABG, DM, gout, arotic stenosis.   Precautions  Precautions Knee;Fall  Required Braces or Orthoses Knee Immobilizer - Right  Knee Immobilizer - Right Other (comment) (discontinued per previous PT note )  Restrictions  Weight Bearing Restrictions Yes  RLE Weight Bearing WBAT  Home Living  Family/patient expects to be discharged to: Private residence  Living Arrangements Spouse/significant other  Available Help at Discharge Family  Type of [place] to enter  Entrance Stairs-Number of Steps 4+1  Entrance Stairs-Rails Right  [place] to live on main level with bedroom/bathroom;Two level  Alternate Level Stairs-Number of Steps stair lift  [place] - 2 wheels;Cane - single point;Shower seat;Grab bars - toilet;BSC;Transport chair  Additional Comments 1 step into walk in shower. suction up grabbar in shower. handicapped height toilet.  Prior Function  Level of Independence Independent with assistive device(s)  Comments used cane  Communication  Communication No difficulties  Cognition  Arousal/Alertness Awake/alert  Behavior During Therapy Anxious  Overall Cognitive Status Within Functional Limits for tasks assessed  Upper Extremity Assessment  Upper Extremity Assessment Defer to OT evaluation  Lower Extremity Assessment  Lower Extremity Assessment RLE deficits/detail;LLE deficits/detail  RLE Deficits / Details Rt knee limited by pain; knee 2+/5 in sitting; Rt hip 3-/5; AROM in sitting 50 degrees   RLE Unable to fully assess due to pain  RLE Coordination decreased gross motor  LLE Deficits / Details knee 4/5; hip 4-/5    Cervical / Trunk Assessment  Cervical / Trunk Assessment  Normal  Bed Mobility  Overal bed mobility Needs Assistance  Bed Mobility Supine to Sit  Supine to sit Mod assist;HOB elevated  General bed mobility comments pt greatly limited with ability to advance Rt LE to EOB today; requires (A) to advance Rt LE and to elevate trunk to sitting position; max cues for hand placement and sequencing   Transfers  Overall transfer level Needs assistance  Equipment used Rolling walker (2 wheeled)  Transfers Sit to/from [facility]  Sit to Stand +2 physical assistance;Mod assist;From elevated surface  Stand pivot transfers Max assist;From elevated surface;+2 physical assistance  General transfer comment pt greatly limited today due to weakness and pain; requires 2 person (A) for transfers with elevated bed; cues for upright posture and hand placement with RW; difficulty advancing Rt LE with pivot and requires facilitation for pivotal step   Ambulation/Gait  General Gait Details unable to safely assess today   Balance  Overall balance assessment Needs assistance  Sitting-balance support Feet supported;Single extremity supported;No upper extremity supported  Sitting balance-Leahy Scale Fair  Standing balance support During functional activity;Bilateral upper extremity supported  Standing balance-Leahy Scale Zero  Standing balance comment requires (A) and bil UE support   Exercises  Exercises Total Joint  Total Joint Exercises  Ankle Circles/Pumps AROM;Both;10 reps;Supine  Quad Sets AROM;Right;10 reps;Seated  PT - End of Session  Equipment Utilized During Treatment Gait belt  Activity Tolerance Patient limited by pain;Patient limited by fatigue  Patient left in chair;with call bell/phone within reach;with nursing/sitter in room  Nurse Communication Mobility status;Precautions;Need for lift equipment;Weight bearing status  PT Assessment  PT Recommendation/Assessment Patient needs continued PT services  PT Problem  List Decreased  strength;Decreased range of motion;Decreased balance;Decreased mobility;Pain;Decreased knowledge of use of DME;Obesity;Decreased activity tolerance;Decreased safety awareness  Barriers to Discharge Decreased caregiver support  PT Therapy Diagnosis  Difficulty walking;Abnormality of gait;Acute pain  PT Plan  PT Frequency 7X/week  PT Treatment/Interventions DME instruction;Gait training;Stair training;Functional mobility training;Therapeutic activities;Therapeutic exercise;Patient/family education;Balance training;Neuromuscular re-education  PT Recommendation  Recommendations for Other Services OT consult  Follow Up Recommendations SNF  PT equipment None recommended by PT  Individuals Consulted  Consulted and Agree with Results and Recommendations Patient  Acute Rehab PT Goals  Patient Stated Goal to go to rehab then home to get stronger   PT Goal Formulation With patient  Time For Goal Achievement 06/03/14  Potential to Achieve Goals Good  PT Time Calculation  PT Start Time 1152  PT Stop Time 1212  PT Time Calculation (min) 20 min  PT General Charges  $$ ACUTE PT VISIT 1 Procedure  PT Evaluation  $PT Re-evaluation 1 Procedure  PT Treatments  $Therapeutic Activity 8-22 mins    Pt seen for re-evaluation due to new generalized weakness after Rt TKA on 05/24/14. Pt undergoing stroke workup at this time. Pt presents with significant decliner in functional mobility at this time with generalized weakness and pain. Pt to continue to benefit from skilled acute PT to address deficits and maximize functional mobility. D/C disposition updated due to significant decline in functional mobility. Pt will require SNF due to increased (A) needed for mobility. Goals adjusted properly.   [name], [name] 06/03/14

## 2014-05-27 NOTE — Progress Notes (Signed)
New order for foley to be placed.  Foley inserted with sterile technique, pt tolerated well.  Clear yellow urine returned. Cori Razor, RN, 05/27/2014 12:19 PM

## 2014-05-27 NOTE — Progress Notes (Signed)
[facility] PROGRESS NOTE  [name] TDD:[id number] DOB: 14-Jul-1941 DOA: 05/24/2014 PCP: [name], MD  Assessment/Plan: 1. Acute encephalopathy -Patient undergoing stroke workup which included an MRI of the brain which did not reveal acute intracranial findings. -I suspect acute encephalopathy may have resulted from multiple factors including effective psychotropic medications, underlying infectious process, acute on chronic renal failure and dehydration. -Symptoms resolving by this morning's evaluation. -Will simplify regimen, stopping Valium, continue with Klonopin 1 mg at bedtime, 0.5 in a.m. provide IV fluids  2. Acute on chronic renal failure -Suspect secondary to volume depletion. -Ordered a 500 mL bolus of NS, with maintenance fluids running at 100 mL/hour -Repeat a.m. lab work  3. Anticoagulation -Patient previously on Xarelto. I discussed case with pharmacy who recommended change to Lovenox  4. Atrial fibrillation/flutter -Rate controlled, continue the Toprol 25 mg by mouth twice a day  5. Hyponatremia -Likely secondary to hypovolemic hyponatremia -Sodium levels trended down from 134 on 05/26/2014 to 128 on 05/27/2014 -Providing IV fluids, will recheck a.m. Labs  6. Urinary tract infection -Continue Cipro IV  7. Right total knee arthroplasty -Orthopedic surgery following, tinea physical therapy    Code Status: Full Code Family Communication: Spoke to family at bedside Disposition Plan: Start IV fluids   Consultants:  Neurology  Orthopedic Surgery   Antibiotics:  Cipro  HPI/Subjective: Patient is a 73 year old gentleman with a past medical history of coronary artery disease status post coronary artery bypass grafting, history of atrial fibrillation had been on anticoagulation therapy with Xarelto, aortic valve replacement, presented as a transfer from [facility] on 05/26/2014. Patient was found to have changes in mental status,  becoming confused. There was concern over possible right upper extremity weakness.  Objective: Filed Vitals:   05/27/14 1751  BP: 162/40  Pulse: 77  Temp: 98.8 F (37.1 C)  Resp: 16    Intake/Output Summary (Last 24 hours) at 05/27/14 1821 Last data filed at 05/27/14 1439  Gross per 24 hour  Intake    120 ml  Output    325 ml  Net   -205 ml   Filed Weights   05/24/14 1640 05/27/14 0200  Weight: 119.75 kg (264 lb) 121.609 kg (268 lb 1.6 oz)    Exam:   General:  Patient is in no acute distress, awake alert oriented  Cardiovascular: Regular rate rhythm normal S1-S2  Respiratory: Clear to auscultation bilaterally  Abdomen: Soft nontender meds to  Musculoskeletal: 1+ edema to lower extremities bilaterally  Data Reviewed: Basic Metabolic Panel:  Recent Labs Lab 05/25/14 0436 05/26/14 0519 05/27/14 0015  NA 132* 134* 128*  K 5.6* 4.9 5.0  CL 102 103 96  CO2 18* 20 18*  GLUCOSE 218* 177* 189*  BUN 39* 45* 50*  CREATININE 2.11* 2.33* 3.40*  CALCIUM 8.4 8.6 8.2*   Liver Function Tests:  Recent Labs Lab 05/27/14 0015  AST 15  ALT 10  ALKPHOS 46  BILITOT 0.5  PROT 5.2*  ALBUMIN 2.7*   No results found for this basename: LIPASE, AMYLASE,  in the last 168 hours  Recent Labs Lab 05/27/14 0015  AMMONIA 28   CBC:  Recent Labs Lab 05/25/14 0436 05/26/14 0519 05/27/14 0015  WBC 9.5 9.2 16.5*  NEUTROABS  --   --  14.2*  HGB 12.1* 10.4* 10.1*  HCT 33.7* 30.5* 28.2*  MCV 89.2 89.2 89.0  PLT 126* 121* 104*   Cardiac Enzymes: No results found for this basename: CKTOTAL, CKMB, CKMBINDEX, TROPONINI,  in the last 168 hours BNP (last 3 results) No results found for this basename: PROBNP,  in the last 8760 hours CBG:  Recent Labs Lab 05/26/14 2127 05/27/14 0632 05/27/14 1055 05/27/14 1446 05/27/14 1625  GLUCAP 226* 190* 178* 187* 166*    Recent Results (from the past 240 hour(s))  SURGICAL PCR SCREEN     Status: None   Collection Time     05/18/14  2:40 PM      Result Value Ref Range Status   MRSA, PCR NEGATIVE  NEGATIVE Final   Staphylococcus aureus NEGATIVE  NEGATIVE Final   Comment:            The Xpert SA Assay (FDA     approved for NASAL specimens     in patients over 29 years of age),     is one component of     a comprehensive surveillance     program.  Test performance has     been validated by [employer] for patients greater     than or equal to 12 year old.     It is not intended     to diagnose infection nor to     guide or monitor treatment.     Studies: Ct Head Wo Contrast  05/27/2014   CLINICAL DATA:  Upper extremity weakness.  EXAM: CT HEAD WITHOUT CONTRAST  TECHNIQUE: Contiguous axial images were obtained from the base of the skull through the vertex without intravenous contrast.  COMPARISON:  05/09/2012  FINDINGS: Skull and Sinuses:Negative for fracture or destructive process.  Orbits: Bilateral cataract resection.  Brain: No evidence of acute abnormality, such as acute infarction, hemorrhage, hydrocephalus, or mass lesion/mass effect. Generalized brain atrophy, stable from 2013. Incidental choroid plexus xanthogranulomas.  IMPRESSION: No acute intracranial findings.   Electronically Signed   By: Jorje Guild M.D.   On: 05/27/2014 00:07   Mri Brain Without Contrast  05/27/2014   CLINICAL DATA:  Right arm weakness with confusion. Multiple stroke risk factors including atrial fibrillation on Xarelto, also hypertension, diabetes, and coronary artery disease.  EXAM: MRI HEAD WITHOUT CONTRAST  MRA HEAD WITHOUT CONTRAST  TECHNIQUE: Multiplanar, multiecho pulse sequences of the brain and surrounding structures were obtained without intravenous contrast. Angiographic images of the head were obtained using MRA technique without contrast.  COMPARISON:  CT head 05/26/2014.  MR head 03/19/2008.  FINDINGS: MRI HEAD FINDINGS  Mild cerebral and cerebellar atrophy. Minimal white matter disease. No acute stroke,  hemorrhage, mass lesion, or hydrocephalus. No extra-axial fluid. Flow voids are maintained. Partial empty sella. Bilateral cataract extraction. Extracranial soft tissues unremarkable.  Similar appearance to prior MR and CT.  MRA HEAD FINDINGS  Internal carotid arteries are widely patent. Basilar artery widely patent with vertebrals codominant. Mild non stenotic irregularity of the anterior, middle, or posterior cerebral arteries. No cerebellar branch occlusion. No intracranial aneurysm.  IMPRESSION: Mild age-related changes as described. No acute intracranial findings.  No proximal flow reducing lesion is evident.   Electronically Signed   By: Rolla Flatten M.D.   On: 05/27/2014 10:57   Dg Chest Port 1 View  05/27/2014   CLINICAL DATA:  Fever, lethargy.  EXAM: PORTABLE CHEST - 1 VIEW  COMPARISON:  05/18/2014.  FINDINGS: Chronic cardiopericardial enlargement. Mediastinal contours are distorted by rightward rotation. Prominent interstitial markings, likely from interstitial crowding given low lung volumes. No definitive pneumonia. No effusion or pneumothorax. Left axillary surgical clips.  IMPRESSION: 1. No evidence of  pneumonia. 2. Low lung volumes, which decreases diagnostic sensitivity.   Electronically Signed   By: Jorje Guild M.D.   On: 05/27/2014 00:09   Mr Jodene Nam Head/brain Wo Cm  05/27/2014   CLINICAL DATA:  Right arm weakness with confusion. Multiple stroke risk factors including atrial fibrillation on Xarelto, also hypertension, diabetes, and coronary artery disease.  EXAM: MRI HEAD WITHOUT CONTRAST  MRA HEAD WITHOUT CONTRAST  TECHNIQUE: Multiplanar, multiecho pulse sequences of the brain and surrounding structures were obtained without intravenous contrast. Angiographic images of the head were obtained using MRA technique without contrast.  COMPARISON:  CT head 05/26/2014.  MR head 03/19/2008.  FINDINGS: MRI HEAD FINDINGS  Mild cerebral and cerebellar atrophy. Minimal white matter disease. No acute  stroke, hemorrhage, mass lesion, or hydrocephalus. No extra-axial fluid. Flow voids are maintained. Partial empty sella. Bilateral cataract extraction. Extracranial soft tissues unremarkable.  Similar appearance to prior MR and CT.  MRA HEAD FINDINGS  Internal carotid arteries are widely patent. Basilar artery widely patent with vertebrals codominant. Mild non stenotic irregularity of the anterior, middle, or posterior cerebral arteries. No cerebellar branch occlusion. No intracranial aneurysm.  IMPRESSION: Mild age-related changes as described. No acute intracranial findings.  No proximal flow reducing lesion is evident.   Electronically Signed   By: Rolla Flatten M.D.   On: 05/27/2014 10:57    Scheduled Meds: . allopurinol  200 mg Oral Daily  . amLODipine  10 mg Oral Q breakfast  . [START ON 05/28/2014] ciprofloxacin  400 mg Intravenous Q24H  . clonazePAM  0.5 mg Oral Daily  . clonazePAM  1 mg Oral QHS  . docusate sodium  100 mg Oral BID  . [START ON 05/28/2014] enoxaparin (LOVENOX) injection  120 mg Subcutaneous Q24H  . insulin aspart  0-9 Units Subcutaneous TID WC  . insulin glargine  1-6 Units Subcutaneous Daily  . metoprolol tartrate  25 mg Oral BID  . terazosin  10 mg Oral Q breakfast   Continuous Infusions: . sodium chloride 100 mL/hr at 05/27/14 1206    Principal Problem:   OA (osteoarthritis) of knee Active Problems:   Sarcoidosis   OBSTRUCTIVE SLEEP APNEA   MYOCARDIAL INFARCTION   CORONARY HEART DISEASE   AORTIC STENOSIS   VALVULAR HEART DISEASE   Hx of CABG   Diabetes   Gout   BPH (benign prostatic hyperplasia)   Benign hypertensive heart disease without heart failure   Chronic kidney disease (CKD), stage III (moderate)   Weakness   Fever    Time spent: 35 min    [facility] Pager [phone]. If 7PM-7AM, please contact night-coverage at [email or web address], password [facility] 05/27/2014, 6:21 PM  LOS: 3 days

## 2014-05-27 NOTE — Clinical Social Work Psychosocial (Signed)
Clinical Social Work Department BRIEF PSYCHOSOCIAL ASSESSMENT 05/27/2014  Patient:  SENAY, SISTRUNK     Account Number:  [id number]     Admit date:  05/24/2014  Clinical Social Worker:  Lovey Newcomer  Date/Time:  05/27/2014 04:15 PM  Referred by:  Physician  Date Referred:  05/27/2014 Referred for  SNF Placement   Other Referral:   Interview type:  Patient Other interview type:   Patient and wife interviewed to complete assessment.    PSYCHOSOCIAL DATA Living Status:  WIFE Admitted from facility:   Level of care:   Primary support name:  Dearl Rudden Primary support relationship to patient:  SPOUSE Degree of support available:   Support is strong.    CURRENT CONCERNS Current Concerns  Post-Acute Placement   Other Concerns:    SOCIAL WORK ASSESSMENT / PLAN CSW spoke with and wife to complete assessment. Patient states that he plans to go to SNF at discharge and that his wife has "been working on this." Patient states that he lives with his wife and "she takes care of these things so can you talk to her?" CSW explained that [place] can defer to wife for SNF plans. Patient would like this. Patient's wife states that she has worked on getting patient into [facility]. She states, "I spoke with [facility] about a month ago and they said they would try to have a bed for him and make him comfortable." CSW explained SNF search/placement process to patient and wife and explained that placement at [facility] cannot be guaranteed. CSW explained that [facility] would first have to offer patient a bed after reviewing his medical information. Patient's wife verbalized understanding. Patient's seemed overwhelmed by thoughts of caring for patient when she stated, " I would take him home but I can't take care of him. I told him that if it ever got to this point we would have to send him to a rehab. I hope he want be upset, but he did say he never wanted to go to one." Patient doesn't seem upset  about need for SNF placement.   Assessment/plan status:  Psychosocial Support/Ongoing Assessment of Needs Other assessment/ plan:   Complete FL2, Fax, PASRR   Information/referral to community resources:   CSW contact information and SNF list given to patient.    PATIENT'S/FAMILY'S RESPONSE TO PLAN OF CARE: Patient and wife plan for patient to go to SNF at discharge. Their preference is [facility] but understand that this cannot be guaranteed. Patient and wife were engaged in assessment and appreciative of CSW's assistance. CSW will assist with DC when appropriate.       Liz Beach MSW, [place], [place], [phone]

## 2014-05-27 NOTE — Progress Notes (Signed)
PT Cancellation Note  Patient Details Name: [name] MRN: [id number] DOB: [birth year]   Cancelled Treatment:    Reason Eval/Treat Not Completed: Patient at procedure or test/unavailable. Pt at MRI. Please clarify if pt is still clear to work with PT with new stroke like symptoms. Thanks.    [place] , [place]  05/27/2014, 9:04 AM

## 2014-05-27 NOTE — Significant Event (Signed)
Rapid Response Event Note  Overview: Time Called: 0005 Arrival Time: 0010 Event Type: Neurologic;Unknown  Initial Focused Assessment: Dr. Hal Hope had seen patient and had ordered transfer to [facility] for possible stroke. Patient has returned from [place]. Patient able to follow commands and answer questions. He is slow to respond and appears exhausted. Respirations labored at times and he denies difficulty breathing. Mentation labile. BP was low.    Interventions: 02 at 2l/m, IV fluids started. Assessed and observed until e-link arrived   Event Summary:   at      at    Outcome: Transferred (Comment)  Event End Time: [place]

## 2014-05-28 DIAGNOSIS — I517 Cardiomegaly: Secondary | ICD-10-CM

## 2014-05-28 DIAGNOSIS — I4891 Unspecified atrial fibrillation: Secondary | ICD-10-CM

## 2014-05-28 DIAGNOSIS — Z96659 Presence of unspecified artificial knee joint: Secondary | ICD-10-CM

## 2014-05-28 LAB — BASIC METABOLIC PANEL
BUN: 53 mg/dL — ABNORMAL HIGH (ref 6–23)
CALCIUM: 7.7 mg/dL — AB (ref 8.4–10.5)
CO2: 18 meq/L — AB (ref 19–32)
Chloride: 99 mEq/L (ref 96–112)
Creatinine, Ser: 3.27 mg/dL — ABNORMAL HIGH (ref 0.50–1.35)
GFR calc Af Amer: 20 mL/min — ABNORMAL LOW (ref >90)
GFR calc non Af Amer: 17 mL/min — ABNORMAL LOW (ref >90)
GLUCOSE: 137 mg/dL — AB (ref 70–99)
Potassium: 4.4 mEq/L (ref 3.7–5.3)
SODIUM: 128 meq/L — AB (ref 137–147)

## 2014-05-28 LAB — CBC
HCT: 25.3 % — ABNORMAL LOW (ref 39.0–52.0)
Hemoglobin: 8.9 g/dL — ABNORMAL LOW (ref 13.0–17.0)
MCH: 31.3 pg (ref 26.0–34.0)
MCHC: 35.2 g/dL (ref 30.0–36.0)
MCV: 89.1 fL (ref 78.0–100.0)
PLATELETS: 87 10*3/uL — AB (ref 150–400)
RBC: 2.84 MIL/uL — AB (ref 4.22–5.81)
RDW: 15 % (ref 11.5–15.5)
WBC: 11.5 10*3/uL — ABNORMAL HIGH (ref 4.0–10.5)

## 2014-05-28 LAB — LIPID PANEL
CHOL/HDL RATIO: 7.6 ratio
CHOLESTEROL: 106 mg/dL (ref 0–200)
HDL: 14 mg/dL — ABNORMAL LOW (ref >39)
LDL Cholesterol: 58 mg/dL (ref 0–99)
Triglycerides: 169 mg/dL — ABNORMAL HIGH (ref <150)
VLDL: 34 mg/dL (ref 0–40)

## 2014-05-28 LAB — URINE CULTURE: Colony Count: 10000

## 2014-05-28 LAB — GLUCOSE, CAPILLARY
GLUCOSE-CAPILLARY: 207 mg/dL — AB (ref 70–99)
GLUCOSE-CAPILLARY: 238 mg/dL — AB (ref 70–99)
Glucose-Capillary: 125 mg/dL — ABNORMAL HIGH (ref 70–99)
Glucose-Capillary: 137 mg/dL — ABNORMAL HIGH (ref 70–99)
Glucose-Capillary: 258 mg/dL — ABNORMAL HIGH (ref 70–99)

## 2014-05-28 MED ORDER — SODIUM CHLORIDE 0.9 % IV BOLUS (SEPSIS)
500.0000 mL | Freq: Once | INTRAVENOUS | Status: AC
  Administered 2014-05-28: 500 mL via INTRAVENOUS

## 2014-05-28 MED ORDER — ENOXAPARIN SODIUM 120 MG/0.8ML ~~LOC~~ SOLN: 120.0000 mg | SUBCUTANEOUS | Status: DC

## 2014-05-28 MED ORDER — CLONAZEPAM 1 MG PO TABS: 1.0000 mg | ORAL_TABLET | Freq: Every day | ORAL | Status: DC

## 2014-05-28 MED ORDER — CIPROFLOXACIN HCL 500 MG PO TABS
500.0000 mg | ORAL_TABLET | Freq: Every day | ORAL | Status: DC
  Administered 2014-05-28 – 2014-05-30 (×3): 500 mg via ORAL
  Filled 2014-05-28 (×7): qty 1

## 2014-05-28 MED ORDER — METOPROLOL TARTRATE 25 MG PO TABS: 25.0000 mg | ORAL_TABLET | Freq: Two times a day (BID) | ORAL | Status: DC

## 2014-05-28 MED ORDER — METOPROLOL TARTRATE 50 MG PO TABS
50.0000 mg | ORAL_TABLET | Freq: Two times a day (BID) | ORAL | Status: DC
  Administered 2014-05-28: 50 mg via ORAL
  Filled 2014-05-28 (×2): qty 1

## 2014-05-28 MED ORDER — CIPROFLOXACIN HCL 500 MG PO TABS: 500.0000 mg | ORAL_TABLET | Freq: Every day | ORAL | Status: DC

## 2014-05-28 MED ORDER — METOPROLOL TARTRATE 25 MG PO TABS
25.0000 mg | ORAL_TABLET | Freq: Two times a day (BID) | ORAL | Status: DC
  Administered 2014-05-28 – 2014-05-30 (×3): 25 mg via ORAL
  Filled 2014-05-28 (×5): qty 1

## 2014-05-28 NOTE — Progress Notes (Signed)
UR complete.  Monserratt Knezevic RN, MSN 

## 2014-05-28 NOTE — Clinical Social Work Placement (Addendum)
Clinical Social Work Department CLINICAL SOCIAL WORK PLACEMENT NOTE 05/28/2014  Patient:  [name], [name]  Account Number:  [id number] Admit date:  05/24/2014  Clinical Social Worker:  Lovey Newcomer  Date/time:  05/28/2014 08:32 AM  Clinical Social Work is seeking post-discharge placement for this patient at the following level of care:   SKILLED NURSING   (*CSW will update this form in Epic as items are completed)   05/28/2014  Patient/family provided with [place] Department of Clinical Social Work's list of facilities offering this level of care within the geographic area requested by the patient (or if unable, by the patient's family).  05/28/2014  Patient/family informed of their freedom to choose among providers that offer the needed level of care, that participate in Medicare, Medicaid or managed care program needed by the patient, have an available bed and are willing to accept the patient.  05/28/2014  Patient/family informed of [facility]' ownership interest in [facility], as well as of the fact that they are under no obligation to receive care at this facility.  PASARR submitted to EDS on 05/28/2014 PASARR number received from EDS on 05/28/2014  FL2 transmitted to all facilities in geographic area requested by pt/family on  05/28/2014 FL2 transmitted to all facilities within larger geographic area on   Patient informed that his/her managed care company has contracts with or will negotiate with  certain facilities, including the following:     Patient/family informed of bed offers received:  05/28/14 Patient chooses bed at [facility] Physician recommends and patient chooses bed at    Patient to be transferred to [facility] on 05/30/14- Blima Rich, [place]   Patient to be transferred to facility by [place]   The following physician request were entered in Epic:   Additional Comments: Provided bed offer to pt's  wife. Wife went to facility to complete admissions paperwork. MD to discharge pt over the weekend. Will inform weekend CSW to facilitate discharge when pt deemed medically ready. FL2 is on chart for MD signature.   Liz Beach MSW, [place], [place], [phone]   Ky Barban, MSW, [facility] Clinical Social Worker [phone]

## 2014-05-28 NOTE — Progress Notes (Addendum)
PHARMACIST - PHYSICIAN COMMUNICATION  CONCERNING: Antibiotic IV to Oral Route Change Policy  RECOMMENDATION: This patient is receiving Cipro by the intravenous route.  Based on criteria approved by the Pharmacy and Therapeutics Committee, the antibiotic(s) is/are being converted to the equivalent oral dose form(s).   DESCRIPTION: These criteria include:  Patient being treated for a respiratory tract infection, urinary tract infection, cellulitis or clostridium difficile associated diarrhea if on metronidazole  The patient is not neutropenic and does not exhibit a GI malabsorption state  The patient is eating (either orally or via tube) and/or has been taking other orally administered medications for a least 24 hours  The patient is improving clinically and has a Tmax < 100.5  If you have questions about this conversion, please contact the Pharmacy Department  []   [phone] )  [facility] [x]   [phone] )  [facility]  []   [phone] )  [facility] []   [phone] )  [facility]    Will change Cipro to 500mg  PO q24h.  [employer], Pharm.D., BCPS Clinical Pharmacist Pager [phone] 05/28/2014 10:47 AM

## 2014-05-28 NOTE — Progress Notes (Signed)
VASCULAR LAB PRELIMINARY  PRELIMINARY  PRELIMINARY  PRELIMINARY  Carotid duplex  completed.    Preliminary report:  Bilateral:  1-39% ICA stenosis.  Vertebral artery flow is antegrade.      Nani Ravens, RVT 05/28/2014, 1:25 PM

## 2014-05-28 NOTE — Progress Notes (Signed)
Subjective: 4 Days Post-Op Procedure(s) (LRB): RIGHT TOTAL KNEE ARTHROPLASTY (Right) Patient reports pain as moderate. Controlled with po Dilaudid  Patient is doing better overall. Hoping to go to SNF upon discharge Plan is to go Skilled nursing facility after hospital stay.  Objective: Vital signs in last 24 hours: Temp:  [98.5 F (36.9 C)-100.2 F (37.9 C)] 98.7 F (37.1 C) (06/05 0601) Pulse Rate:  [64-83] 78 (06/05 0601) Resp:  [16-24] 18 (06/05 0601) BP: (154-164)/(37-71) 162/70 mmHg (06/05 0601) SpO2:  [95 %-100 %] 98 % (06/05 0601)  Intake/Output from previous day:  Intake/Output Summary (Last 24 hours) at 05/28/14 0754 Last data filed at 05/28/14 0535  Gross per 24 hour  Intake    120 ml  Output   2600 ml  Net  -2480 ml    Intake/Output this shift:    Labs:  Recent Labs  05/26/14 0519 05/27/14 0015 05/28/14 0140  HGB 10.4* 10.1* 8.9*    Recent Labs  05/27/14 0015 05/28/14 0140  WBC 16.5* 11.5*  RBC 3.17* 2.84*  HCT 28.2* 25.3*  PLT 104* 87*    Recent Labs  05/27/14 0015 05/28/14 0140  NA 128* 128*  K 5.0 4.4  CL 96 99  CO2 18* 18*  BUN 50* 53*  CREATININE 3.40* 3.27*  GLUCOSE 189* 137*  CALCIUM 8.2* 7.7*   No results found for this basename: LABPT, INR,  in the last 72 hours  EXAM General - Patient is Alert, Appropriate and Oriented Extremity - Neurovascular intact No cellulitis present Compartment soft Minimal swelling right knee. No erythema Dressing/Incision - clean, dry, no drainage Motor Function - intact, moving foot and toes well on exam.   Past Medical History  Diagnosis Date  . Coronary heart disease     stent, CABG, RBBB  . Valvular heart disease     aortic stenosis/regurgitation  . Sarcoid 2011    pulmonary and bone marrow  . Hypercalcemia 2011    due to sarcoidiosis  . Bone marrow disease   . Diabetes mellitus   . Hemorrhagic cystitis 2012  . Gout   . HTN (hypertension)   . A-fib     controlled with  medication  . Myocardial infarction     1995, 2002, 2006  . OSA (obstructive sleep apnea)     use bipap setting of 10 and 12  . Complication of anesthesia 11-16-13    required alot of versed per anesthesia with cataract surgery  . Chronic kidney disease (CKD), stage III (moderate)     lov note dr Koleen Nimrod nephrology 09-17-13 on chart  . Heart murmur   . Atrial flutter     hx of  . CHF (congestive heart failure)   . Pneumonia 1966, 2011    hx of  . Anxiety   . Osteoarthritis   . DDD (degenerative disc disease)   . Synovial cyst of lumbar spine     l3-l4, l4-l5, injections  . Anemia   . Hepatitis     mono  . Cancer     basal cell  . Right sciatic nerve pain     for block thursday 01-21-2014, injections  . Fungal toenail infection   . Biceps tendon tear     right  . Diabetes     Assessment/Plan: 4 Days Post-Op Procedure(s) (LRB): RIGHT TOTAL KNEE ARTHROPLASTY (Right) Principal Problem:   OA (osteoarthritis) of knee Active Problems:   Sarcoidosis   OBSTRUCTIVE SLEEP APNEA   MYOCARDIAL INFARCTION   CORONARY  HEART DISEASE   AORTIC STENOSIS   VALVULAR HEART DISEASE   Hx of CABG   Diabetes   Gout   BPH (benign prostatic hyperplasia)   Benign hypertensive heart disease without heart failure   Chronic kidney disease (CKD), stage III (moderate)   Weakness   Fever   Up with therapy Renal function stabilized and slightly improved. Plan per medical team Leukocytosis improved  DVT Prophylaxis - Xarelto Weight-Bearing as tolerated to right leg  Gaynelle Arabian V 05/28/2014, 7:54 AM

## 2014-05-28 NOTE — Progress Notes (Signed)
[facility] PROGRESS NOTE  UMBERTO PAVEK DQQ:[id number] DOB: Jun 02, 1941 DOA: 05/24/2014 PCP: [name], MD  Assessment/Plan: 1. Acute encephalopathy -Patient undergoing stroke workup which included an MRI of the brain which did not reveal acute intracranial findings. -I suspect acute encephalopathy may have resulted from multiple factors including effective psychotropic medications, underlying infectious process, acute on chronic renal failure and dehydration. -Symptoms resolved.  2. Acute on chronic renal failure -Suspect secondary to volume depletion. -Ordered a 500 mL bolus of NS, with maintenance fluids running at 100 mL/hour -Repeat a.m. lab work  3. Anticoagulation -Patient previously on Xarelto. I discussed case with pharmacy who recommended change to Lovenox  4. Atrial fibrillation/flutter -Rate controlled, continue the Toprol 25 mg by mouth twice a day  5. Hyponatremia -Likely secondary to hypovolemic hyponatremia -Sodium levels remain low at 128 -Providing IV fluids, will recheck a.m. Labs  6. Urinary tract infection -Continue Cipro IV  7. Right total knee arthroplasty -Orthopedic surgery following, physical therapy    Code Status: Full Code Family Communication: Spoke to family at bedside Disposition Plan:Anticipate discharge in the next 24 hours   Consultants:  Neurology  Orthopedic Surgery   Antibiotics:  Cipro  HPI/Subjective: Patient is a 73 year old gentleman with a past medical history of coronary artery disease status post coronary artery bypass grafting, history of atrial fibrillation had been on anticoagulation therapy with Xarelto, aortic valve replacement, presented as a transfer from [facility] on [facility]/26/2014. Patient was found to have changes in mental status, becoming confused. There was concern over possible right upper extremity weakness.  Patient reporting ongoing knee pain, working with PT.   Objective: Filed  Vitals:   05/28/14 1341  BP: 136/66  Pulse: 76  Temp: 98.7 F (37.1 C)  Resp: 20    Intake/Output Summary (Last 24 hours) at 05/28/14 1724 Last data filed at 05/28/14 1300  Gross per 24 hour  Intake   1180 ml  Output   4100 ml  Net  -2920 ml   Filed Weights   05/24/14 1640 05/27/14 0200  Weight: 119.75 kg (264 lb) 121.609 kg (268 lb 1.6 oz)    Exam:   General:  Patient is in no acute distress, awake alert oriented  Cardiovascular: Regular rate rhythm normal S1-S2  Respiratory: Clear to auscultation bilaterally  Abdomen: Soft nontender meds to  Musculoskeletal: 1+ edema to lower extremities bilaterally  Data Reviewed: Basic Metabolic Panel:  Recent Labs Lab 05/25/14 0436 05/26/14 0519 05/27/14 0015 05/28/14 0140  NA 132* 134* 128* 128*  K 5.6* 4.9 5.0 4.4  CL 102 103 96 99  CO2 18* 20 18* 18*  GLUCOSE 218* 177* 189* 137*  BUN 39* 45* 50* 53*  CREATININE 2.11* 2.33* 3.40* 3.27*  CALCIUM 8.4 8.6 8.2* 7.7*   Liver Function Tests:  Recent Labs Lab 05/27/14 0015  AST 15  ALT 10  ALKPHOS 46  BILITOT 0.5  PROT 5.2*  ALBUMIN 2.7*   No results found for this basename: LIPASE, AMYLASE,  in the last 168 hours  Recent Labs Lab 05/27/14 0015  AMMONIA 28   CBC:  Recent Labs Lab 05/25/14 0436 05/26/14 0519 05/27/14 0015 05/28/14 0140  WBC 9.5 9.2 16.5* 11.5*  NEUTROABS  --   --  14.2*  --   HGB 12.1* 10.4* 10.1* 8.9*  HCT 33.7* 30.5* 28.2* 25.3*  MCV 89.2 89.2 89.0 89.1  PLT 126* 121* 104* 87*   Cardiac Enzymes: No results found for this basename: CKTOTAL, CKMB, CKMBINDEX,  TROPONINI,  in the last 168 hours BNP (last 3 results) No results found for this basename: PROBNP,  in the last 8760 hours CBG:  Recent Labs Lab 05/27/14 1625 05/27/14 2203 05/28/14 0652 05/28/14 1136 05/28/14 1708  GLUCAP 166* 125* 137* 207* 238*    Recent Results (from the past 240 hour(s))  CULTURE, BLOOD (ROUTINE X 2)     Status: None   Collection Time     05/27/14 12:15 AM      Result Value Ref Range Status   Specimen Description BLOOD RIGHT ANTECUBITAL   Final   Special Requests BOTTLES DRAWN AEROBIC ONLY 10CC   Final   Culture  Setup Time     Final   Value: 05/27/2014 03:40     Performed at [facility]   Culture     Final   Value:        BLOOD CULTURE RECEIVED NO GROWTH TO DATE CULTURE WILL BE HELD FOR 5 DAYS BEFORE ISSUING A FINAL NEGATIVE REPORT     Performed at [facility]   Report Status PENDING   Incomplete  CULTURE, BLOOD (ROUTINE X 2)     Status: None   Collection Time    05/27/14 12:20 AM      Result Value Ref Range Status   Specimen Description BLOOD RIGHT HAND   Final   Special Requests BOTTLES DRAWN AEROBIC ONLY 10CC   Final   Culture  Setup Time     Final   Value: 05/27/2014 03:40     Performed at [facility]   Culture     Final   Value:        BLOOD CULTURE RECEIVED NO GROWTH TO DATE CULTURE WILL BE HELD FOR 5 DAYS BEFORE ISSUING A FINAL NEGATIVE REPORT     Performed at [facility]   Report Status PENDING   Incomplete  URINE CULTURE     Status: None   Collection Time    05/27/14  2:52 AM      Result Value Ref Range Status   Specimen Description URINE, RANDOM   Final   Special Requests NONE   Final   Culture  Setup Time     Final   Value: 05/27/2014 03:48     Performed at [place]     Final   Value: 10,000 COLONIES/ML     Performed at [facility]   Culture     Final   Value: Multiple bacterial morphotypes present, none predominant. Suggest appropriate recollection if clinically indicated.     Performed at [facility]   Report Status 05/28/2014 FINAL   Final     Studies: Ct Head Wo Contrast  05/27/2014   CLINICAL DATA:  Upper extremity weakness.  EXAM: CT HEAD WITHOUT CONTRAST  TECHNIQUE: Contiguous axial images were obtained from the base of the skull through the vertex without intravenous contrast.  COMPARISON:  05/09/2012   FINDINGS: Skull and Sinuses:Negative for fracture or destructive process.  Orbits: Bilateral cataract resection.  Brain: No evidence of acute abnormality, such as acute infarction, hemorrhage, hydrocephalus, or mass lesion/mass effect. Generalized brain atrophy, stable from 2013. Incidental choroid plexus xanthogranulomas.  IMPRESSION: No acute intracranial findings.   Electronically Signed   By: Jorje Guild M.D.   On: 05/27/2014 00:07   Mri Brain Without Contrast  05/27/2014   CLINICAL DATA:  Right arm weakness with confusion. Multiple stroke risk factors including atrial fibrillation on  Xarelto, also hypertension, diabetes, and coronary artery disease.  EXAM: MRI HEAD WITHOUT CONTRAST  MRA HEAD WITHOUT CONTRAST  TECHNIQUE: Multiplanar, multiecho pulse sequences of the brain and surrounding structures were obtained without intravenous contrast. Angiographic images of the head were obtained using MRA technique without contrast.  COMPARISON:  CT head 05/26/2014.  MR head 03/19/2008.  FINDINGS: MRI HEAD FINDINGS  Mild cerebral and cerebellar atrophy. Minimal white matter disease. No acute stroke, hemorrhage, mass lesion, or hydrocephalus. No extra-axial fluid. Flow voids are maintained. Partial empty sella. Bilateral cataract extraction. Extracranial soft tissues unremarkable.  Similar appearance to prior MR and CT.  MRA HEAD FINDINGS  Internal carotid arteries are widely patent. Basilar artery widely patent with vertebrals codominant. Mild non stenotic irregularity of the anterior, middle, or posterior cerebral arteries. No cerebellar branch occlusion. No intracranial aneurysm.  IMPRESSION: Mild age-related changes as described. No acute intracranial findings.  No proximal flow reducing lesion is evident.   Electronically Signed   By: Rolla Flatten M.D.   On: 05/27/2014 10:57   Dg Chest Port 1 View  05/27/2014   CLINICAL DATA:  Fever, lethargy.  EXAM: PORTABLE CHEST - 1 VIEW  COMPARISON:  05/18/2014.   FINDINGS: Chronic cardiopericardial enlargement. Mediastinal contours are distorted by rightward rotation. Prominent interstitial markings, likely from interstitial crowding given low lung volumes. No definitive pneumonia. No effusion or pneumothorax. Left axillary surgical clips.  IMPRESSION: 1. No evidence of pneumonia. 2. Low lung volumes, which decreases diagnostic sensitivity.   Electronically Signed   By: Jorje Guild M.D.   On: 05/27/2014 00:09   Mr Jodene Nam Head/brain Wo Cm  05/27/2014   CLINICAL DATA:  Right arm weakness with confusion. Multiple stroke risk factors including atrial fibrillation on Xarelto, also hypertension, diabetes, and coronary artery disease.  EXAM: MRI HEAD WITHOUT CONTRAST  MRA HEAD WITHOUT CONTRAST  TECHNIQUE: Multiplanar, multiecho pulse sequences of the brain and surrounding structures were obtained without intravenous contrast. Angiographic images of the head were obtained using MRA technique without contrast.  COMPARISON:  CT head 05/26/2014.  MR head 03/19/2008.  FINDINGS: MRI HEAD FINDINGS  Mild cerebral and cerebellar atrophy. Minimal white matter disease. No acute stroke, hemorrhage, mass lesion, or hydrocephalus. No extra-axial fluid. Flow voids are maintained. Partial empty sella. Bilateral cataract extraction. Extracranial soft tissues unremarkable.  Similar appearance to prior MR and CT.  MRA HEAD FINDINGS  Internal carotid arteries are widely patent. Basilar artery widely patent with vertebrals codominant. Mild non stenotic irregularity of the anterior, middle, or posterior cerebral arteries. No cerebellar branch occlusion. No intracranial aneurysm.  IMPRESSION: Mild age-related changes as described. No acute intracranial findings.  No proximal flow reducing lesion is evident.   Electronically Signed   By: Rolla Flatten M.D.   On: 05/27/2014 10:57    Scheduled Meds: . allopurinol  200 mg Oral Daily  . amLODipine  10 mg Oral Q breakfast  . ciprofloxacin  500 mg Oral  Q breakfast  . clonazePAM  0.5 mg Oral Daily  . clonazePAM  1 mg Oral QHS  . docusate sodium  100 mg Oral BID  . enoxaparin (LOVENOX) injection  120 mg Subcutaneous Q24H  . insulin aspart  0-9 Units Subcutaneous TID WC  . metoprolol tartrate  25 mg Oral BID  . terazosin  10 mg Oral Q breakfast   Continuous Infusions: . sodium chloride 100 mL/hr at 05/28/14 1014    Principal Problem:   OA (osteoarthritis) of knee Active Problems:   Sarcoidosis  OBSTRUCTIVE SLEEP APNEA   MYOCARDIAL INFARCTION   CORONARY HEART DISEASE   AORTIC STENOSIS   VALVULAR HEART DISEASE   Hx of CABG   Diabetes   Gout   BPH (benign prostatic hyperplasia)   Benign hypertensive heart disease without heart failure   Chronic kidney disease (CKD), stage III (moderate)   Weakness   Fever    Time spent: 25 min    [facility] Pager [phone]. If 7PM-7AM, please contact night-coverage at [email or web address], password [facility] 05/28/2014, 5:24 PM  LOS: 4 days

## 2014-05-28 NOTE — Progress Notes (Signed)
Echocardiogram 2D Echocardiogram has been performed.  [name] 05/28/2014, 1:26 PM

## 2014-05-28 NOTE — Progress Notes (Signed)
Occupational Therapy Treatment Patient Details Name: [name] MRN: [id number] DOB: 1941-10-21 Today's Date: 05/28/2014    History of present illness 73 yo male s/p R TKA 05/24/14 pt transferred to [facility] 6/3 secondary to new onset of generalized weakness. Stroke workup underway . hx o MI, CABG, DM, gout, arotic stenosis.    OT comments  Pt. Awake upon arrival.  Difficult to initiate participation in skilled OT as pt. Was and remained perseverated on dilaudid demands  throughout session.  Reviewed that some pain would be present and offered repositioning and encouraged eob/oob.  Pt. Remains fixated on pain meds and often pressured me to obtain for him and i directed him to discuss with the nurse.  Able to come eob with mod a.  But does better returning to bed with only min guard a required.  Unable to complete LB dressing without assistance.  Note plan is now for SNF and agree pt. Will need continued therapies before being safe and functional at home.  Follow Up Recommendations  No OT follow up;Supervision/Assistance - 24 hour , note plan is now for SNF   Equipment Recommendations  None recommended by OT          Precautions / Restrictions Precautions Precautions: Knee;Fall Required Braces or Orthoses: Knee Immobilizer - Right (KI not in room RN states it was left at [facility] is looking into this)       Mobility Bed Mobility Overal bed mobility: Needs Assistance Bed Mobility: Supine to Sit;Rolling;Sidelying to Sit Rolling: Mod assist Sidelying to sit: Mod assist Supine to sit: Mod assist Sit to supine: Min guard   General bed mobility comments: hob flat, rails removed for simulation of home environment.  pt. unable to come into sitting.  attempted log roll and pt. would not follow inst. relying on ur support to pull trunk upright.  also to advance RLE which he is very protective of  Transfers                 General transfer comment: no transfer today secondary to pt. refusal  with out second staff member and KI                                       ADL Overall ADL's : Needs assistance/impaired                     Lower Body Dressing: Maximal assistance;Sitting/lateral leans                 General ADL Comments: pt. attempted LB dressing seated EOB.  unable to reach RLE but stated he "knew he could do the LLE"  upon attempt pt. was unable to but stated it was due to the catheter taking up 45mm of space in his bladder preventinga  forward bend.  got visibly frustrated and states "you know what lets just stop and pass on this for now".  denied need for A/E.  unable to transfer to [facility] secondary to pt. wanted a 2nd staff member present and [place] which is not in room.  RN aware and 'working on it"  pt. perseverated on pain meds throughout session and was pressuring me to persue them for him.  Cognition     Overall Cognitive Status: Difficult to assess (some perseveration noted on pain meds and inappropriate jokes made but this may be his norm)                                                  General Comments      Pertinent Vitals/ Pain       4/10 but demanding pain meds, RN provided via IV                                                          Frequency Min 2X/week     Progress Toward Goals  OT Goals(current goals can now be found in the care plan section)  Progress towards OT goals: Progressing toward goals     Plan Discharge plan remains appropriate;Discharge plan needs to be updated;Other (comment) (see plan is now for SNF)                     End of Session     Activity Tolerance Patient tolerated treatment well;Patient limited by pain   Patient Left in bed;with call bell/phone within reach;with bed alarm set   Nurse Communication Patient requests pain meds        Time: 2620-3559 OT Time Calculation  (min): 36 min  Charges: OT General Charges $OT Visit: 1 Procedure OT Treatments $Self Care/Home Management : 23-37 mins  Rico Junker Tamiah Dysart, COTA/L 05/28/2014, 9:16 AM

## 2014-05-28 NOTE — Progress Notes (Signed)
Physical Therapy Treatment Patient Details Name: [name] MRN: [id number] DOB: Feb 21, 1941 Today's Date: 05/28/2014    History of Present Illness 73 yo male s/p R TKA 05/24/14 pt transferred to [facility] 6/3 secondary to new onset of generalized weakness. Stroke workup underway . hx o MI, CABG, DM, gout, arotic stenosis.     PT Comments    Pt able to progress with gt today. Pt perseverating on pain medication throughout session and requires max encouragement to progress with mobility. Cont to recommend SNF due to (A) needed for mobility.   Follow Up Recommendations  SNF     Equipment Recommendations  None recommended by PT    Recommendations for Other Services OT consult     Precautions / Restrictions Precautions Precautions: Knee;Fall Required Braces or Orthoses: Knee Immobilizer - Right Knee Immobilizer - Right: Discontinue once straight leg raise with < 10 degree lag (at this time pt cannot perform SLR so KI was utilized ) Restrictions Weight Bearing Restrictions: Yes RLE Weight Bearing: Weight bearing as tolerated    Mobility  Bed Mobility Overal bed mobility: Needs Assistance Bed Mobility: Supine to Sit Rolling: Mod assist Sidelying to sit: Mod assist Supine to sit: Min assist;HOB elevated Sit to supine: Min guard   General bed mobility comments: pt with HOB elevated and relies heavily on handrails; min (A) to elevate trunk to sitting position; cues for hand placement and sequencing; pt requires incr time due to pain   Transfers Overall transfer level: Needs assistance Equipment used: Rolling walker (2 wheeled) Transfers: Sit to/from Stand Sit to Stand: +2 physical assistance;Min assist;From elevated surface         General transfer comment: 2 person required for safety; pt with difficulty achieving upright standing position due to pain and weakness; decreased ability to WB through Rt LE; relies heavily on UEs with use of RW   Ambulation/Gait Ambulation/Gait  assistance: +2 physical assistance;Min guard Ambulation Distance (Feet): 60 Feet Assistive device: Rolling walker (2 wheeled) Gait Pattern/deviations: Step-to pattern;Decreased stance time - right;Decreased step length - left;Antalgic;Trunk flexed Gait velocity: decreased due to pain  Gait velocity interpretation: Below normal speed for age/gender General Gait Details: pt with labored breathing throughout ambulation; cues for upright posture and gt sequencing; 2 person helpful for safety; pt fatigues quickly    Stairs            Wheelchair Mobility    Modified Rankin (Stroke Patients Only)       Balance Overall balance assessment: Needs assistance Sitting-balance support: Feet supported;Single extremity supported Sitting balance-Leahy Scale: Fair     Standing balance support: During functional activity;Bilateral upper extremity supported Standing balance-Leahy Scale: Poor Standing balance comment: (A) and bil UEs supported by RW                     Cognition Arousal/Alertness: Awake/alert Behavior During Therapy: Anxious Overall Cognitive Status: Difficult to assess                      Exercises Total Joint Exercises Ankle Circles/Pumps: AROM;Strengthening;Both;10 reps;Seated Quad Sets: AROM;Right;10 reps;Seated Heel Slides: PROM;Right;5 reps;Seated Hip ABduction/ADduction: AAROM;Strengthening;Right;10 reps;Seated Long Arc Quad: AAROM;Right;10 reps;Seated Goniometric ROM: 2 - 70 degrees in sitting     General Comments        Pertinent Vitals/Pain 7-8/10; patient repositioned for comfort; pt premedicated    Home Living  Prior Function            PT Goals (current goals can now be found in the care plan section) Acute Rehab PT Goals Patient Stated Goal: to go to rehab then home to get stronger  PT Goal Formulation: With patient Time For Goal Achievement: 06/03/14 Potential to Achieve Goals: Good Progress  towards PT goals: Progressing toward goals    Frequency  7X/week    PT Plan Current plan remains appropriate    Co-evaluation             End of Session Equipment Utilized During Treatment: Gait belt;Right knee immobilizer Activity Tolerance: Patient limited by fatigue;Patient limited by pain Patient left: in chair;with call bell/phone within reach;with family/visitor present;with nursing/sitter in room     Time: 6553-7482 PT Time Calculation (min): 23 min  Charges:  $Gait Training: 8-22 mins $Therapeutic Exercise: 8-22 mins                    G CodesKennis Carina [place], [place]  [phone] 05/28/2014, 1:11 PM

## 2014-05-28 NOTE — Progress Notes (Addendum)
Subjective: Patient reports feeling much better.  Only fatigued at this time.  Remains on Xarelto.    Objective: Current vital signs: BP 182/70  Pulse 90  Temp(Src) 98.7 F (37.1 C) (Oral)  Resp 20  Ht 5\' 11"  (1.803 m)  Wt 121.609 kg (268 lb 1.6 oz)  BMI 37.41 kg/m2  SpO2 95% Vital signs in last 24 hours: Temp:  [98.5 F (36.9 C)-99.9 F (37.7 C)] 98.7 F (37.1 C) (06/05 0844) Pulse Rate:  [68-90] 90 (06/05 0844) Resp:  [16-20] 20 (06/05 0844) BP: (154-182)/(37-71) 182/70 mmHg (06/05 0844) SpO2:  [95 %-100 %] 95 % (06/05 0844)  Intake/Output from previous day: 06/04 0701 - 06/05 0700 In: 120 [P.O.:120] Out: 2600 [Urine:2600] Intake/Output this shift: Total I/O In: -  Out: 850 [Urine:850] Nutritional status:    Neurologic Exam: Mental Status:  Alert, oriented, thought content appropriate. Speech fluent without evidence of aphasia. Able to follow 3 step commands without difficulty.  Cranial Nerves:  II: Discs flat bilaterally; Visual fields grossly normal, pupils equal, round, reactive to light and accommodation  III,IV, VI: ptosis not present, extra-ocular motions intact bilaterally  V,VII: smile symmetric, facial light touch sensation normal bilaterally  VIII: hearing normal bilaterally  IX,X: gag reflex present  XI: bilateral shoulder shrug  XII: midline tongue extension without atrophy or fasciculations  Motor:  There is mild weakness right UE but patient said it is due to " a rotator cuff problem" but able to lift easily against gravity. Right LE with limited movements due to recent surgery.  Tone and bulk:normal tone throughout; no atrophy noted  Sensory: Pinprick and light touch intact throughout, bilaterally  Deep Tendon Reflexes:  1+ throughout  Plantars:  Right: downgoing   Left: downgoing  Cerebellar:  normal finger-to-nose, normal heel-to-shin test   Lab Results: Basic Metabolic Panel:  Recent Labs Lab 05/25/14 0436 05/26/14 0519 05/27/14 0015  05/28/14 0140  NA 132* 134* 128* 128*  K 5.6* 4.9 5.0 4.4  CL 102 103 96 99  CO2 18* 20 18* 18*  GLUCOSE 218* 177* 189* 137*  BUN 39* 45* 50* 53*  CREATININE 2.11* 2.33* 3.40* 3.27*  CALCIUM 8.4 8.6 8.2* 7.7*    Liver Function Tests:  Recent Labs Lab 05/27/14 0015  AST 15  ALT 10  ALKPHOS 46  BILITOT 0.5  PROT 5.2*  ALBUMIN 2.7*   No results found for this basename: LIPASE, AMYLASE,  in the last 168 hours  Recent Labs Lab 05/27/14 0015  AMMONIA 28    CBC:  Recent Labs Lab 05/25/14 0436 05/26/14 0519 05/27/14 0015 05/28/14 0140  WBC 9.5 9.2 16.5* 11.5*  NEUTROABS  --   --  14.2*  --   HGB 12.1* 10.4* 10.1* 8.9*  HCT 33.7* 30.5* 28.2* 25.3*  MCV 89.2 89.2 89.0 89.1  PLT 126* 121* 104* 87*    Cardiac Enzymes: No results found for this basename: CKTOTAL, CKMB, CKMBINDEX, TROPONINI,  in the last 168 hours  Lipid Panel:  Recent Labs Lab 05/27/14 0015 05/28/14 0140  CHOL 120 106  TRIG 169* 169*  HDL 30* 14*  CHOLHDL 4.0 7.6  VLDL 34 34  LDLCALC 56 58    CBG:  Recent Labs Lab 05/27/14 1055 05/27/14 1446 05/27/14 1625 05/27/14 2203 05/28/14 0652  GLUCAP 178* 187* 166* 125* 137*    Microbiology: Results for orders placed during the hospital encounter of 05/24/14  CULTURE, BLOOD (ROUTINE X 2)     Status: None   Collection Time  05/27/14 12:15 AM      Result Value Ref Range Status   Specimen Description BLOOD RIGHT ANTECUBITAL   Final   Special Requests BOTTLES DRAWN AEROBIC ONLY 10CC   Final   Culture  Setup Time     Final   Value: 05/27/2014 03:40     Performed at [facility]   Culture     Final   Value:        BLOOD CULTURE RECEIVED NO GROWTH TO DATE CULTURE WILL BE HELD FOR 5 DAYS BEFORE ISSUING A FINAL NEGATIVE REPORT     Performed at [facility]   Report Status PENDING   Incomplete  CULTURE, BLOOD (ROUTINE X 2)     Status: None   Collection Time    05/27/14 12:20 AM      Result Value Ref Range Status    Specimen Description BLOOD RIGHT HAND   Final   Special Requests BOTTLES DRAWN AEROBIC ONLY 10CC   Final   Culture  Setup Time     Final   Value: 05/27/2014 03:40     Performed at [facility]   Culture     Final   Value:        BLOOD CULTURE RECEIVED NO GROWTH TO DATE CULTURE WILL BE HELD FOR 5 DAYS BEFORE ISSUING A FINAL NEGATIVE REPORT     Performed at [facility]   Report Status PENDING   Incomplete  URINE CULTURE     Status: None   Collection Time    05/27/14  2:52 AM      Result Value Ref Range Status   Specimen Description URINE, RANDOM   Final   Special Requests NONE   Final   Culture  Setup Time     Final   Value: 05/27/2014 03:48     Performed at [place]     Final   Value: 10,000 COLONIES/ML     Performed at [facility]   Culture     Final   Value: Multiple bacterial morphotypes present, none predominant. Suggest appropriate recollection if clinically indicated.     Performed at [facility]   Report Status 05/28/2014 FINAL   Final    Coagulation Studies: No results found for this basename: LABPROT, INR,  in the last 72 hours  Imaging: Ct Head Wo Contrast  05/27/2014   CLINICAL DATA:  Upper extremity weakness.  EXAM: CT HEAD WITHOUT CONTRAST  TECHNIQUE: Contiguous axial images were obtained from the base of the skull through the vertex without intravenous contrast.  COMPARISON:  05/09/2012  FINDINGS: Skull and Sinuses:Negative for fracture or destructive process.  Orbits: Bilateral cataract resection.  Brain: No evidence of acute abnormality, such as acute infarction, hemorrhage, hydrocephalus, or mass lesion/mass effect. Generalized brain atrophy, stable from 2013. Incidental choroid plexus xanthogranulomas.  IMPRESSION: No acute intracranial findings.   Electronically Signed   By: Jorje Guild M.D.   On: 05/27/2014 00:07   Mri Brain Without Contrast  05/27/2014   CLINICAL DATA:  Right arm weakness with  confusion. Multiple stroke risk factors including atrial fibrillation on Xarelto, also hypertension, diabetes, and coronary artery disease.  EXAM: MRI HEAD WITHOUT CONTRAST  MRA HEAD WITHOUT CONTRAST  TECHNIQUE: Multiplanar, multiecho pulse sequences of the brain and surrounding structures were obtained without intravenous contrast. Angiographic images of the head were obtained using MRA technique without contrast.  COMPARISON:  CT head 05/26/2014.  MR head 03/19/2008.  FINDINGS: MRI HEAD FINDINGS  Mild cerebral and cerebellar atrophy. Minimal white matter disease. No acute stroke, hemorrhage, mass lesion, or hydrocephalus. No extra-axial fluid. Flow voids are maintained. Partial empty sella. Bilateral cataract extraction. Extracranial soft tissues unremarkable.  Similar appearance to prior MR and CT.  MRA HEAD FINDINGS  Internal carotid arteries are widely patent. Basilar artery widely patent with vertebrals codominant. Mild non stenotic irregularity of the anterior, middle, or posterior cerebral arteries. No cerebellar branch occlusion. No intracranial aneurysm.  IMPRESSION: Mild age-related changes as described. No acute intracranial findings.  No proximal flow reducing lesion is evident.   Electronically Signed   By: Rolla Flatten M.D.   On: 05/27/2014 10:57   Dg Chest Port 1 View  05/27/2014   CLINICAL DATA:  Fever, lethargy.  EXAM: PORTABLE CHEST - 1 VIEW  COMPARISON:  05/18/2014.  FINDINGS: Chronic cardiopericardial enlargement. Mediastinal contours are distorted by rightward rotation. Prominent interstitial markings, likely from interstitial crowding given low lung volumes. No definitive pneumonia. No effusion or pneumothorax. Left axillary surgical clips.  IMPRESSION: 1. No evidence of pneumonia. 2. Low lung volumes, which decreases diagnostic sensitivity.   Electronically Signed   By: Jorje Guild M.D.   On: 05/27/2014 00:09   Mr Jodene Nam Head/brain Wo Cm  05/27/2014   CLINICAL DATA:  Right arm weakness  with confusion. Multiple stroke risk factors including atrial fibrillation on Xarelto, also hypertension, diabetes, and coronary artery disease.  EXAM: MRI HEAD WITHOUT CONTRAST  MRA HEAD WITHOUT CONTRAST  TECHNIQUE: Multiplanar, multiecho pulse sequences of the brain and surrounding structures were obtained without intravenous contrast. Angiographic images of the head were obtained using MRA technique without contrast.  COMPARISON:  CT head 05/26/2014.  MR head 03/19/2008.  FINDINGS: MRI HEAD FINDINGS  Mild cerebral and cerebellar atrophy. Minimal white matter disease. No acute stroke, hemorrhage, mass lesion, or hydrocephalus. No extra-axial fluid. Flow voids are maintained. Partial empty sella. Bilateral cataract extraction. Extracranial soft tissues unremarkable.  Similar appearance to prior MR and CT.  MRA HEAD FINDINGS  Internal carotid arteries are widely patent. Basilar artery widely patent with vertebrals codominant. Mild non stenotic irregularity of the anterior, middle, or posterior cerebral arteries. No cerebellar branch occlusion. No intracranial aneurysm.  IMPRESSION: Mild age-related changes as described. No acute intracranial findings.  No proximal flow reducing lesion is evident.   Electronically Signed   By: Rolla Flatten M.D.   On: 05/27/2014 10:57    Medications:  I have reviewed the patient's current medications. Scheduled: . allopurinol  200 mg Oral Daily  . amLODipine  10 mg Oral Q breakfast  . ciprofloxacin  400 mg Intravenous Q24H  . clonazePAM  0.5 mg Oral Daily  . clonazePAM  1 mg Oral QHS  . docusate sodium  100 mg Oral BID  . enoxaparin (LOVENOX) injection  120 mg Subcutaneous Q24H  . insulin aspart  0-9 Units Subcutaneous TID WC  . insulin glargine  1-6 Units Subcutaneous Daily  . metoprolol tartrate  50 mg Oral BID  . terazosin  10 mg Oral Q breakfast    Assessment/Plan: Patient much improved today.  MRI of the brain reviewed and shows no acute changes.  MRA  unremarkable as well.  Symptoms likely multifactorial in etiology and related medications, metabolic issues and infection.    Recommendations: 1.  Continue Xarelto 2.  No further neurologic intervention is recommended at this time.  If further questions arise, please call or page at that time.  Thank you for allowing  neurology to participate in the care of this patient.  Case discussed with Dr. Coralyn Pear   LOS: 4 days   Alexis Goodell, MD [facility] [phone] 05/28/2014  10:43 AM

## 2014-05-28 NOTE — Discharge Summary (Signed)
Physician Discharge Summary  [name] WYO:[id number] DOB: 1941-09-13 DOA: 05/24/2014  PCP: [name], MD  Admit date: 05/24/2014 Discharge date: 05/28/2014  Time spent: 35 minutes  Recommendations for Outpatient Follow-up:  1. Please follow up on repeat BMP and CBC in 2-3 days. He was found to have acute on chronic renal failure as well as hyponatremia 2. Patient to receive Cipro for 5 days, anticipated stop date Wed, May 28 2014.    Discharge Diagnoses:  Principal Problem:   OA (osteoarthritis) of knee Active Problems:   Sarcoidosis   OBSTRUCTIVE SLEEP APNEA   MYOCARDIAL INFARCTION   CORONARY HEART DISEASE   AORTIC STENOSIS   VALVULAR HEART DISEASE   Hx of CABG   Diabetes   Gout   BPH (benign prostatic hyperplasia)   Benign hypertensive heart disease without heart failure   Chronic kidney disease (CKD), stage III (moderate)   Weakness   Fever   Discharge Condition: Stable  Diet recommendation: Heart Health/Carb Modified  Filed Weights   05/24/14 1640 05/27/14 0200  Weight: 119.75 kg (264 lb) 121.609 kg (268 lb 1.6 oz)    History of present illness:  patient is a 73 year old male who comes in for a preoperative History and Physical. The patient is scheduled for a right total knee arthroplasty to be performed by Dr. Dione Plover. [name], MD at [facility] on 05-24-2014.  The patient is a 73 year old male who presents with knee complaints. The patient was seen for a second opinion. The patient reports right knee symptoms including: pain which began over 1 year(s) (1/2) ago following a specific injury. The injury occurred due to a fall. Prior to being seen today the patient was previously evaluated by a colleague ([name]) 1 year(s) ago (1/2). Previous work-up for this problem has included knee x-rays. Past treatment for this problem has included intra-articular injection of corticosteroids (and viscosupplementation, first series helped, 2nd did not). Note  for "Knee pain": [name] referred him here to discuss surgery and consider knee replacement.  He's had pain in his knee for several years. It's gotten much worse in the past year to 2 years. He has seen Dr. [name], and she has treated him for his knee pain. She has performed cortisone and Visco supplement injections. The last series of Visco supplements did not help at all. [name] states that his knee is hurting at all times. It is limiting what he can and cannot do. His wife says he is nowhere near as active as he used to be and nowhere near as active as he needs to be because of this knee. He is at a stage now where he'd like to get this fixed in an effort to diminish his pain and improve his activity level. He is wishing to proceed with the knee replacement at this time.  They have been treated conservatively in the past for the above stated problem and despite conservative measures, they continue to have progressive pain and severe functional limitations and dysfunction. They have failed non-operative management including home exercise, medications, and injections. It is felt that they would benefit from undergoing total joint replacement. Risks and benefits of the procedure have been discussed with the patient and they elect to proceed with surgery. There are no active contraindications to surgery such as ongoing infection or rapidly progressive neurological disease.   Hospital Course:  Patient is a pleasant 73 year old woman with a past medical history of coronary artery  disease status post coronary artery bypass grafting, atrial flutter/fibrillation on Xarelto, status post aortic valve replacement with bioprosthetic mouth, type 2 diabetes mellitus who was initially admitted to the orthopedic service on 05/23/2014, undergoing a total right knee arthroplasty, procedure performed on 05/24/2014 by Dr. [name] of orthopedic surgery. Patient tolerated the procedure well there are no immediate  complications. On 05/26/2014 patient was found to have a change in mental status, having slurred speech and possible left upper extremity weakness. Rapid response was called as CVA was suspected. He was seen and evaluated by neurology who recommended transfer from [facility] to [facility] for further evaluation. On 05/27/2014 he had an MRI of brain which did not show acute intracranial finding. I suspect the cause of his mental status changes were likely related to multiple factors including the effect of psychotropic medications, underlying infectious process, acute on chronic renal failure and dehydration. He was diagnosed with urinary tract infection and started on ciprofloxacin. Lab work revealed an upward trend in his creatinine peaking at 3.4 on 05/27/2014, with the development of hyponatremia having sodium of 128. After the initiation of empiric antimicrobial therapy, IV fluids, patient showed gradual improvement. Case discussed with Dr. Doy Mince of neurology who did not feel that presentation was related to underlying TIA/CVA and likely secondary to metabolic causes.   1. Acute encephalopathy -Patient undergoing stroke workup which included an MRI of the brain which did not reveal acute intracranial findings.  -I suspect acute encephalopathy may have resulted from multiple factors including effective psychotropic medications, underlying infectious process, acute on chronic renal failure and dehydration.  -Symptoms resolving by this morning's evaluation.  -Will simplify regimen, stopping Valium, continue with Klonopin 1 mg at bedtime, 0.5 in a.m. provide IV fluids  2. Acute on chronic renal failure  -Suspect secondary to volume depletion.  -Ordered a 500 mL bolus of NS, with maintenance fluids running at 100 mL/hour  -Repeat a.m. lab work  3. Anticoagulation  -Patient previously on Xarelto, which was stopped due stopped due to renal failure.  4. Atrial fibrillation/flutter   -Rate controlled, continue the Toprol 25 mg by mouth twice a day  5. Hyponatremia  -Likely secondary to hypovolemic hyponatremia  -Sodium levels trended down from 134 on 05/26/2014 to 128 on 05/27/2014  -Providing IV fluids, will recheck a.m. Labs  6. Urinary tract infection  -Continue Cipro IV  7. Right total knee arthroplasty  -Orthopedic surgery following, tinea physical therapy   Procedures:  Right total knee arthroplasty, procedure performed on 05/24/2014  Consultations:  Orthopedic surgery  Neurology  Physical therapy/occupational therapy  Discharge Exam: Filed Vitals:   05/28/14 1341  BP: 136/66  Pulse: 76  Temp: 98.7 F (37.1 C)  Resp: 20   General: Patient is in no acute distress, awake alert oriented  Cardiovascular: Regular rate rhythm normal S1-S2  Respiratory: Clear to auscultation bilaterally  Abdomen: Soft nontender meds to  Musculoskeletal: 1+ edema to lower extremities bilaterally, s/p orthopedic procedure to right knee, no evidence of infection or hematoma     Discharge Instructions You were cared for by a hospitalist during your hospital stay. If you have any questions about your discharge medications or the care you received while you were in the hospital after you are discharged, you can call the unit and asked to speak with the hospitalist on call if the hospitalist that took care of you is not available. Once you are discharged, your primary care physician will handle any further medical issues. Please  note that NO REFILLS for any discharge medications will be authorized once you are discharged, as it is imperative that you return to your primary care physician (or establish a relationship with a primary care physician if you do not have one) for your aftercare needs so that they can reassess your need for medications and monitor your lab values.  Discharge Instructions   Call MD / Call 911    Complete by:  As directed   If you experience chest  pain or shortness of breath, CALL 911 and be transported to the hospital emergency room.  If you develope a fever above 101 F, pus (white drainage) or increased drainage or redness at the wound, or calf pain, call your surgeon's office.     Change dressing    Complete by:  As directed   Change dressing daily with sterile 4 x 4 inch gauze dressing and apply TED hose. Do not submerge the incision under water.     Constipation Prevention    Complete by:  As directed   Drink plenty of fluids.  Prune juice may be helpful.  You may use a stool softener, such as Colace (over the counter) 100 mg twice a day.  Use MiraLax (over the counter) for constipation as needed.     Diet - low sodium heart healthy    Complete by:  As directed      Diet Carb Modified    Complete by:  As directed      Discharge instructions    Complete by:  As directed   Pick up stool softner and laxative for home. Do not submerge incision under water. May shower starting Thursday Continue to use ice for pain and swelling from surgery.  The patient may resume his home Xarelto dosing along with the Aspirin.     Do not put a pillow under the knee. Place it under the heel.    Complete by:  As directed      Do not sit on low chairs, stoools or toilet seats, as it may be difficult to get up from low surfaces    Complete by:  As directed      Driving restrictions    Complete by:  As directed   No driving until released by the physician.     Increase activity slowly as tolerated    Complete by:  As directed      Lifting restrictions    Complete by:  As directed   No lifting until released by the physician.     Patient may shower    Complete by:  As directed   You may shower without a dressing once there is no drainage.  Do not wash over the wound.  If drainage remains, do not shower until drainage stops.     TED hose    Complete by:  As directed   Use stockings (TED hose) for 3 weeks on both leg(s).  You may remove them at  night for sleeping.     Weight bearing as tolerated    Complete by:  As directed             Medication List    STOP taking these medications       azithromycin 250 MG tablet  Commonly known as:  ZITHROMAX     VOLTAREN 1 % Gel  Generic drug:  diclofenac sodium      TAKE these medications       acetaminophen 500 MG  tablet  Commonly known as:  TYLENOL  Take 1,000 mg by mouth every 6 (six) hours as needed for moderate pain.     allopurinol 100 MG tablet  Commonly known as:  ZYLOPRIM  Take 200 mg by mouth daily with breakfast.     amLODipine 10 MG tablet  Commonly known as:  NORVASC  Take 10 mg by mouth daily with breakfast.     aspirin EC 81 MG tablet  Take 81 mg by mouth at bedtime.     CALCIUM-MAGNESIUM-VITAMIN D ER PO  Take 1 tablet by mouth 2 (two) times daily.     ciprofloxacin 500 MG tablet  Commonly known as:  CIPRO  Take 1 tablet (500 mg total) by mouth daily with breakfast.     clonazePAM 1 MG tablet  Commonly known as:  KLONOPIN  Take 1 tablet (1 mg total) by mouth at bedtime.     clonazePAM 0.5 MG tablet  Commonly known as:  KLONOPIN  Take 0.5-1.5 mg by mouth 2 (two) times daily. 1 tablet in the morning and 3 at night     diazepam 10 MG tablet  Commonly known as:  VALIUM  Take 1 tablet (10 mg total) by mouth every 6 (six) hours as needed for anxiety or sleep (spasms).     diphenhydrAMINE 25 MG tablet  Commonly known as:  BENADRYL  Take 25-50 mg by mouth every 6 (six) hours as needed for allergies.     Glucosamine-Chondroitin 750-600 MG Tabs  Take 1 tablet by mouth 2 (two) times daily.     HYDROmorphone 2 MG tablet  Commonly known as:  DILAUDID  Take 1-3 tablets (2-6 mg total) by mouth every 4 (four) hours as needed for moderate pain or severe pain.     insulin glargine 100 UNIT/ML injection  Commonly known as:  LANTUS  Inject 1-6 Units into the skin daily with breakfast. Pt is on a sliding scale 100-120=1 unit; every 20 above 120 add a unit      lisinopril 40 MG tablet  Commonly known as:  PRINIVIL,ZESTRIL  Take 40 mg by mouth daily with breakfast.     Melatonin 5 MG Tabs  Take 15 mg by mouth at bedtime.     metFORMIN 500 MG tablet  Commonly known as:  GLUCOPHAGE  Take 500 mg by mouth 2 (two) times daily with a meal.     metoprolol tartrate 25 MG tablet  Commonly known as:  LOPRESSOR  Take 25 mg by mouth 2 (two) times daily.     metoprolol tartrate 25 MG tablet  Commonly known as:  LOPRESSOR  Take 1 tablet (25 mg total) by mouth 2 (two) times daily.     multivitamin tablet  Take 1 tablet by mouth 2 (two) times daily.     PRESCRIPTION MEDICATION  Apply 1 drop topically at bedtime. DMSO with Lamisil     Rivaroxaban 15 MG Tabs tablet  Commonly known as:  XARELTO  Take 15 mg by mouth daily with supper.     terazosin 10 MG capsule  Commonly known as:  HYTRIN  Take 10 mg by mouth daily with breakfast.     TESTOSTERONE IM  Inject 200 Units into the muscle. As directed, twice a month     traMADol 50 MG tablet  Commonly known as:  ULTRAM  Take 1-2 tablets (50-100 mg total) by mouth every 6 (six) hours as needed for severe pain (mild to moderate pain). 50 to 100 prn  zolpidem 10 MG tablet  Commonly known as:  AMBIEN  Take 10 mg by mouth at bedtime as needed for sleep.       Allergies  Allergen Reactions  . Actos [Pioglitazone Hydrochloride] Swelling  . Penicillins Other (See Comments)    Serum sickness, swelling  . Amiodarone Other (See Comments)    diarrhea  . Latex Swelling  . Codeine     Makes Pt aggressive  . Depakote [Divalproex Sodium] Diarrhea    severe  . Loop Diuretics     Spike in BUN levels  . Losartan Other (See Comments)  . Nsaids     Renal problems  . Orudis [Ketoprofen] Other (See Comments)    Cannot take due to renal insufficiency  . Pentazocine Lactate   . Poison Ivy Extract [employer] Poison Ivy]   . Rozerem [Ramelteon] Diarrhea    Severe   . Sertraline Hcl     Pt not  sure  . Benazepril Hcl     ? Possible lowers plateletes  . Indomethacin Other (See Comments)    Renal problems   . Minocycline Other (See Comments)    Makes dizzy and feel just lousy.  . Potassium Iodide Rash    Itching   . Shellfish Allergy Rash       Follow-up Information   Follow up with Gearlean Alf, MD. Schedule an appointment as soon as possible for a visit in 2 weeks.   Specialty:  Orthopedic Surgery   Contact information:   [address] [place] [address] [phone]        The results of significant diagnostics from this hospitalization (including imaging, microbiology, ancillary and laboratory) are listed below for reference.    Significant Diagnostic Studies: Dg Chest 2 View  05/18/2014   CLINICAL DATA:  Preop evaluation, prior aortic valve replacement coronary disease  EXAM: CHEST  2 VIEW  COMPARISON:  08/14/2011  FINDINGS: Prior coronary bypass changes noted and prosthetic aortic and tricuspid valves. Stable mild cardiac enlargement with central vascular congestion. No CHF, focal pneumonia, collapse or consolidation. No edema, effusion or pneumothorax. Trachea is midline. Atherosclerosis noted of the aorta. Degenerative changes of the spine.  IMPRESSION: Stable postoperative changes.  Mild cardiomegaly with vascular congestion  No superimposed acute process or interval change   Electronically Signed   By: Daryll Brod M.D.   On: 05/18/2014 15:39   Ct Head Wo Contrast  05/27/2014   CLINICAL DATA:  Upper extremity weakness.  EXAM: CT HEAD WITHOUT CONTRAST  TECHNIQUE: Contiguous axial images were obtained from the base of the skull through the vertex without intravenous contrast.  COMPARISON:  05/09/2012  FINDINGS: Skull and Sinuses:Negative for fracture or destructive process.  Orbits: Bilateral cataract resection.  Brain: No evidence of acute abnormality, such as acute infarction, hemorrhage, hydrocephalus, or mass lesion/mass effect. Generalized  brain atrophy, stable from 2013. Incidental choroid plexus xanthogranulomas.  IMPRESSION: No acute intracranial findings.   Electronically Signed   By: Jorje Guild M.D.   On: 05/27/2014 00:07   Mri Brain Without Contrast  05/27/2014   CLINICAL DATA:  Right arm weakness with confusion. Multiple stroke risk factors including atrial fibrillation on Xarelto, also hypertension, diabetes, and coronary artery disease.  EXAM: MRI HEAD WITHOUT CONTRAST  MRA HEAD WITHOUT CONTRAST  TECHNIQUE: Multiplanar, multiecho pulse sequences of the brain and surrounding structures were obtained without intravenous contrast. Angiographic images of the head were obtained using MRA technique without contrast.  COMPARISON:  CT head 05/26/2014.  MR head 03/19/2008.  FINDINGS: MRI HEAD FINDINGS  Mild cerebral and cerebellar atrophy. Minimal white matter disease. No acute stroke, hemorrhage, mass lesion, or hydrocephalus. No extra-axial fluid. Flow voids are maintained. Partial empty sella. Bilateral cataract extraction. Extracranial soft tissues unremarkable.  Similar appearance to prior MR and CT.  MRA HEAD FINDINGS  Internal carotid arteries are widely patent. Basilar artery widely patent with vertebrals codominant. Mild non stenotic irregularity of the anterior, middle, or posterior cerebral arteries. No cerebellar branch occlusion. No intracranial aneurysm.  IMPRESSION: Mild age-related changes as described. No acute intracranial findings.  No proximal flow reducing lesion is evident.   Electronically Signed   By: Rolla Flatten M.D.   On: 05/27/2014 10:57   Dg Chest Port 1 View  05/27/2014   CLINICAL DATA:  Fever, lethargy.  EXAM: PORTABLE CHEST - 1 VIEW  COMPARISON:  05/18/2014.  FINDINGS: Chronic cardiopericardial enlargement. Mediastinal contours are distorted by rightward rotation. Prominent interstitial markings, likely from interstitial crowding given low lung volumes. No definitive pneumonia. No effusion or pneumothorax. Left  axillary surgical clips.  IMPRESSION: 1. No evidence of pneumonia. 2. Low lung volumes, which decreases diagnostic sensitivity.   Electronically Signed   By: Jorje Guild M.D.   On: 05/27/2014 00:09   Mr Jodene Nam Head/brain Wo Cm  05/27/2014   CLINICAL DATA:  Right arm weakness with confusion. Multiple stroke risk factors including atrial fibrillation on Xarelto, also hypertension, diabetes, and coronary artery disease.  EXAM: MRI HEAD WITHOUT CONTRAST  MRA HEAD WITHOUT CONTRAST  TECHNIQUE: Multiplanar, multiecho pulse sequences of the brain and surrounding structures were obtained without intravenous contrast. Angiographic images of the head were obtained using MRA technique without contrast.  COMPARISON:  CT head 05/26/2014.  MR head 03/19/2008.  FINDINGS: MRI HEAD FINDINGS  Mild cerebral and cerebellar atrophy. Minimal white matter disease. No acute stroke, hemorrhage, mass lesion, or hydrocephalus. No extra-axial fluid. Flow voids are maintained. Partial empty sella. Bilateral cataract extraction. Extracranial soft tissues unremarkable.  Similar appearance to prior MR and CT.  MRA HEAD FINDINGS  Internal carotid arteries are widely patent. Basilar artery widely patent with vertebrals codominant. Mild non stenotic irregularity of the anterior, middle, or posterior cerebral arteries. No cerebellar branch occlusion. No intracranial aneurysm.  IMPRESSION: Mild age-related changes as described. No acute intracranial findings.  No proximal flow reducing lesion is evident.   Electronically Signed   By: Rolla Flatten M.D.   On: 05/27/2014 10:57    Microbiology: Recent Results (from the past 240 hour(s))  SURGICAL PCR SCREEN     Status: None   Collection Time    05/18/14  2:40 PM      Result Value Ref Range Status   MRSA, PCR NEGATIVE  NEGATIVE Final   Staphylococcus aureus NEGATIVE  NEGATIVE Final   Comment:            The Xpert SA Assay (FDA     approved for NASAL specimens     in patients over 21 years of  age),     is one component of     a comprehensive surveillance     program.  Test performance has     been validated by [employer] for patients greater     than or equal to 57 year old.     It is not intended     to diagnose infection nor to     guide or monitor treatment.  CULTURE, BLOOD (ROUTINE X 2)  Status: None   Collection Time    05/27/14 12:15 AM      Result Value Ref Range Status   Specimen Description BLOOD RIGHT ANTECUBITAL   Final   Special Requests BOTTLES DRAWN AEROBIC ONLY 10CC   Final   Culture  Setup Time     Final   Value: 05/27/2014 03:40     Performed at [facility]   Culture     Final   Value:        BLOOD CULTURE RECEIVED NO GROWTH TO DATE CULTURE WILL BE HELD FOR 5 DAYS BEFORE ISSUING A FINAL NEGATIVE REPORT     Performed at [facility]   Report Status PENDING   Incomplete  CULTURE, BLOOD (ROUTINE X 2)     Status: None   Collection Time    05/27/14 12:20 AM      Result Value Ref Range Status   Specimen Description BLOOD RIGHT HAND   Final   Special Requests BOTTLES DRAWN AEROBIC ONLY 10CC   Final   Culture  Setup Time     Final   Value: 05/27/2014 03:40     Performed at [facility]   Culture     Final   Value:        BLOOD CULTURE RECEIVED NO GROWTH TO DATE CULTURE WILL BE HELD FOR 5 DAYS BEFORE ISSUING A FINAL NEGATIVE REPORT     Performed at [facility]   Report Status PENDING   Incomplete  URINE CULTURE     Status: None   Collection Time    05/27/14  2:52 AM      Result Value Ref Range Status   Specimen Description URINE, RANDOM   Final   Special Requests NONE   Final   Culture  Setup Time     Final   Value: 05/27/2014 03:48     Performed at [facility]     Final   Value: 10,000 COLONIES/ML     Performed at [facility]   Culture     Final   Value: Multiple bacterial morphotypes present, none predominant. Suggest appropriate recollection if clinically indicated.      Performed at [facility]   Report Status 05/28/2014 FINAL   Final     Labs: Basic Metabolic Panel:  Recent Labs Lab 05/25/14 0436 05/26/14 0519 05/27/14 0015 05/28/14 0140  NA 132* 134* 128* 128*  K 5.6* 4.9 5.0 4.4  CL 102 103 96 99  CO2 18* 20 18* 18*  GLUCOSE 218* 177* 189* 137*  BUN 39* 45* 50* 53*  CREATININE 2.11* 2.33* 3.40* 3.27*  CALCIUM 8.4 8.6 8.2* 7.7*   Liver Function Tests:  Recent Labs Lab 05/27/14 0015  AST 15  ALT 10  ALKPHOS 46  BILITOT 0.5  PROT 5.2*  ALBUMIN 2.7*   No results found for this basename: LIPASE, AMYLASE,  in the last 168 hours  Recent Labs Lab 05/27/14 0015  AMMONIA 28   CBC:  Recent Labs Lab 05/25/14 0436 05/26/14 0519 05/27/14 0015 05/28/14 0140  WBC 9.5 9.2 16.5* 11.5*  NEUTROABS  --   --  14.2*  --   HGB 12.1* 10.4* 10.1* 8.9*  HCT 33.7* 30.5* 28.2* 25.3*  MCV 89.2 89.2 89.0 89.1  PLT 126* 121* 104* 87*   Cardiac Enzymes: No results found for this basename: CKTOTAL, CKMB, CKMBINDEX, TROPONINI,  in the last 168 hours BNP: BNP (last 3 results) No  results found for this basename: PROBNP,  in the last 8760 hours CBG:  Recent Labs Lab 05/27/14 1446 05/27/14 1625 05/27/14 2203 05/28/14 0652 05/28/14 1136  GLUCAP 187* 166* 125* 137* 207*       Signed:  Kelvin Cellar  [facility] 05/28/2014, 2:09 PM

## 2014-05-28 NOTE — Progress Notes (Signed)
Physical Therapy Treatment Patient Details Name: [name] MRN: [id number] DOB: 1941/04/23 Today's Date: 05/28/2014    History of Present Illness 73 yo male s/p R TKA 05/24/14 pt transferred to [facility] 6/3 secondary to new onset of generalized weakness. Stroke workup underway . hx o MI, CABG, DM, gout, arotic stenosis.     PT Comments    Progressing slowly.  Needs very specific, timely cues as he tries to anticipate what is needed and appears impulsive.  Should make steady improvements once pain decreases somewhat.  Follow Up Recommendations  SNF     Equipment Recommendations  None recommended by PT    Recommendations for Other Services       Precautions / Restrictions Precautions Precautions: Knee;Fall Required Braces or Orthoses: Knee Immobilizer - Right Restrictions RLE Weight Bearing: Weight bearing as tolerated    Mobility  Bed Mobility Overal bed mobility: Needs Assistance Bed Mobility: Supine to Sit     Supine to sit: Min assist;HOB elevated Sit to supine: Min assist   General bed mobility comments: cues for sequencing; more assist need to get trunk to EOB than to get LE's back to bed.  Transfers Overall transfer level: Needs assistance Equipment used: Rolling walker (2 wheeled) Transfers: Sit to/from [facility] Sit to Stand: Min assist;From elevated surface (mod assist from low surface) Stand pivot transfers: Mod assist;+2 safety/equipment (from bed to/from [facility])       General transfer comment: cues for hand placement, safety.  assist for lift and to come forward.  cues to stay in RW  Ambulation/Gait                 Stairs            Wheelchair Mobility    Modified Rankin (Stroke Patients Only)       Balance Overall balance assessment: Needs assistance Sitting-balance support: Single extremity supported;No upper extremity supported Sitting balance-Leahy Scale: Fair       Standing balance-Leahy Scale: Poor                       Cognition Arousal/Alertness: Awake/alert Behavior During Therapy: Anxious Overall Cognitive Status: Difficult to assess                      Exercises      General Comments        Pertinent Vitals/Pain 2/10 at rest, "45"/10 while moving he jests    Home Living                      Prior Function            PT Goals (current goals can now be found in the care plan section) Acute Rehab PT Goals Patient Stated Goal: to go to rehab then home to get stronger  PT Goal Formulation: With patient Time For Goal Achievement: 06/03/14 Potential to Achieve Goals: Good Progress towards PT goals: Progressing toward goals    Frequency  7X/week    PT Plan Current plan remains appropriate    Co-evaluation             End of Session Equipment Utilized During Treatment: Right knee immobilizer Activity Tolerance: Patient limited by fatigue;Patient limited by pain Patient left: in bed;with call bell/phone within reach;with family/visitor present     Time: 1856-3149 PT Time Calculation (min): 23 min  Charges:  $Therapeutic Activity: 23-37 mins  G CodesJack Quarto V 05/28/2014, 5:21 PM 05/28/2014  Donnella Sham, PT [phone]  (pager)

## 2014-05-28 NOTE — Progress Notes (Signed)
Orthopedic Tech Progress Note Patient Details:  [name] 1941/01/14 [id number]  Ortho Devices Type of Ortho Device: Knee Immobilizer Ortho Device/Splint Interventions: Application   Theodoro Parma Cammer 05/28/2014, 10:51 AM

## 2014-05-29 ENCOUNTER — Inpatient Hospital Stay (HOSPITAL_COMMUNITY): Payer: Medicare Other

## 2014-05-29 LAB — CBC
HEMATOCRIT: 25.6 % — AB (ref 39.0–52.0)
HEMOGLOBIN: 8.8 g/dL — AB (ref 13.0–17.0)
MCH: 30.4 pg (ref 26.0–34.0)
MCHC: 34.4 g/dL (ref 30.0–36.0)
MCV: 88.6 fL (ref 78.0–100.0)
Platelets: 88 10*3/uL — ABNORMAL LOW (ref 150–400)
RBC: 2.89 MIL/uL — ABNORMAL LOW (ref 4.22–5.81)
RDW: 14.9 % (ref 11.5–15.5)
WBC: 7.9 10*3/uL (ref 4.0–10.5)

## 2014-05-29 LAB — BASIC METABOLIC PANEL
BUN: 45 mg/dL — ABNORMAL HIGH (ref 6–23)
CHLORIDE: 104 meq/L (ref 96–112)
CO2: 18 mEq/L — ABNORMAL LOW (ref 19–32)
Calcium: 8 mg/dL — ABNORMAL LOW (ref 8.4–10.5)
Creatinine, Ser: 2.41 mg/dL — ABNORMAL HIGH (ref 0.50–1.35)
GFR, EST AFRICAN AMERICAN: 29 mL/min — AB (ref >90)
GFR, EST NON AFRICAN AMERICAN: 25 mL/min — AB (ref >90)
Glucose, Bld: 215 mg/dL — ABNORMAL HIGH (ref 70–99)
POTASSIUM: 4.6 meq/L (ref 3.7–5.3)
Sodium: 134 mEq/L — ABNORMAL LOW (ref 137–147)

## 2014-05-29 LAB — GLUCOSE, CAPILLARY
GLUCOSE-CAPILLARY: 174 mg/dL — AB (ref 70–99)
GLUCOSE-CAPILLARY: 209 mg/dL — AB (ref 70–99)
Glucose-Capillary: 193 mg/dL — ABNORMAL HIGH (ref 70–99)
Glucose-Capillary: 170 mg/dL — ABNORMAL HIGH (ref 70–99)

## 2014-05-29 MED ORDER — MORPHINE SULFATE 15 MG PO TABS
15.0000 mg | ORAL_TABLET | ORAL | Status: DC | PRN
  Administered 2014-05-29 – 2014-05-30 (×3): 15 mg via ORAL
  Filled 2014-05-29 (×3): qty 1

## 2014-05-29 MED ORDER — TRAMADOL HCL 50 MG PO TABS
100.0000 mg | ORAL_TABLET | Freq: Four times a day (QID) | ORAL | Status: DC | PRN
  Administered 2014-05-29: 50 mg via ORAL
  Administered 2014-05-30: 100 mg via ORAL
  Filled 2014-05-29 (×2): qty 2

## 2014-05-29 MED ORDER — TRAMADOL HCL 50 MG PO TABS
50.0000 mg | ORAL_TABLET | Freq: Four times a day (QID) | ORAL | Status: DC | PRN
  Administered 2014-05-29: 50 mg via ORAL
  Filled 2014-05-29: qty 1

## 2014-05-29 MED ORDER — BELLADONNA ALKALOIDS-OPIUM 16.2-60 MG RE SUPP
1.0000 | Freq: Four times a day (QID) | RECTAL | Status: DC | PRN
Start: 2014-05-29 — End: 2014-05-30

## 2014-05-29 MED ORDER — RIVAROXABAN 15 MG PO TABS
15.0000 mg | ORAL_TABLET | Freq: Every day | ORAL | Status: DC
  Administered 2014-05-29: 15 mg via ORAL
  Filled 2014-05-29 (×3): qty 1

## 2014-05-29 MED ORDER — ASPIRIN 81 MG PO CHEW
81.0000 mg | CHEWABLE_TABLET | Freq: Every day | ORAL | Status: DC
  Administered 2014-05-29 – 2014-05-30 (×2): 81 mg via ORAL
  Filled 2014-05-29 (×2): qty 1

## 2014-05-29 NOTE — Progress Notes (Signed)
Notified physician about patient's abdominal discomfort and constipation. Patient has not had a BM since 6/1 prior to surgery.  Bladder spasms stopped once foley was removed. Bladder scan showed 524mL.  Will continue to monitor.

## 2014-05-29 NOTE — Progress Notes (Signed)
Subjective: 5 Days Post-Op Procedure(s) (LRB): RIGHT TOTAL KNEE ARTHROPLASTY (Right) Patient reports pain as moderate.  Pain on ly when WB.  Objective: Vital signs in last 24 hours: Temp:  [98.2 F (36.8 C)-98.8 F (37.1 C)] 98.8 F (37.1 C) (06/06 1011) Pulse Rate:  [60-87] 60 (06/06 1011) Resp:  [16-22] 18 (06/06 1011) BP: (136-190)/(55-72) 161/60 mmHg (06/06 1011) SpO2:  [96 %-100 %] 96 % (06/06 1011)  Intake/Output from previous day: 06/05 0701 - 06/06 0700 In: 2120 [P.O.:920; I.V.:700; IV Piggyback:500] Out: 5200 [Urine:5200] Intake/Output this shift: Total I/O In: -  Out: 350 [Urine:350]   Recent Labs  05/27/14 0015 05/28/14 0140 05/29/14 0500  HGB 10.1* 8.9* 8.8*    Recent Labs  05/28/14 0140 05/29/14 0500  WBC 11.5* 7.9  RBC 2.84* 2.89*  HCT 25.3* 25.6*  PLT 87* 88*    Recent Labs  05/28/14 0140 05/29/14 0500  NA 128* 134*  K 4.4 4.6  CL 99 104  CO2 18* 18*  BUN 53* 45*  CREATININE 3.27* 2.41*  GLUCOSE 137* 215*  CALCIUM 7.7* 8.0*   No results found for this basename: LABPT, INR,  in the last 72 hours  PE:  R knee dressing C/d/I.  NVI at  RLE.  Assessment/Plan: 5 Days Post-Op Procedure(s) (LRB): RIGHT TOTAL KNEE ARTHROPLASTY (Right) Discharge to SNF when deemed appropriate by Hospitalist.  Wylene Simmer 05/29/2014, 10:18 AM

## 2014-05-29 NOTE — Progress Notes (Signed)
Pt has home BIPAP at bedside. Pt states he will place himself on. RT made pt aware to call if he needs any assistance. RT will continue to monitor.

## 2014-05-29 NOTE — Progress Notes (Signed)
Physical Therapy Treatment Patient Details Name: [name] MRN: [id number] DOB: May 22, 1941 Today's Date: 05/29/2014    History of Present Illness 73 yo male s/p R TKA 05/24/14 pt transferred to [facility] 6/3 secondary to new onset of generalized weakness. Stroke workup underway . hx o MI, CABG, DM, gout, arotic stenosis.     PT Comments    Pt progressing slowly with mobility.  Reports he had a horrible night & morning so far due to not having a BM & urinary rentention- discussed with pt how mobilizing can help with these issues; after sitting EOB a couple of mins pt reported feeling the need to have BM.  Assisted pt to [facility] where he was successful with small BM & small amount of urination.  RN made aware.  Pt refused to walk at this time despite encouragement due to rough night/morning.  Pt positioned in recliner at end of session.  Cont with current POC & d/c recommendation of SNF when medically ready.     Follow Up Recommendations  SNF     Equipment Recommendations  None recommended by PT    Recommendations for Other Services       Precautions / Restrictions Precautions Precautions: Knee;Fall Required Braces or Orthoses: Knee Immobilizer - Right Knee Immobilizer - Right: Discontinue once straight leg raise with < 10 degree lag Restrictions Weight Bearing Restrictions: Yes RLE Weight Bearing: Weight bearing as tolerated    Mobility  Bed Mobility Overal bed mobility: Needs Assistance Bed Mobility: Supine to Sit     Supine to sit: HOB elevated;Mod assist     General bed mobility comments: Cues for technique.  Def use of rails along with hand-in-hand grasp to bring shoulders/trunk to sitting upright.  No assist needed for LE's.    Transfers Overall transfer level: Needs assistance Equipment used: Rolling walker (2 wheeled) Transfers: Sit to/from [facility] Sit to Stand: Mod assist;+2 safety/equipment Stand pivot transfers: Min assist;+2 safety/equipment        General transfer comment: Step-by-step cueing for technique as pt states "I need to know exactly what to do".  (A) to achieve standing, shift weight anteriorly, RW management, & controlled descent.    Ambulation/Gait             General Gait Details:   Refused.  Took pivotal steps from bed>3-in-1>recliner-- feel as though pt could have ambulated but refused despite encouragement.     Stairs            Wheelchair Mobility    Modified Rankin (Stroke Patients Only)       Balance                                    Cognition Arousal/Alertness: Awake/alert Behavior During Therapy: Anxious Overall Cognitive Status: Difficult to assess                      Exercises Total Joint Exercises Ankle Circles/Pumps: AROM;Both;10 reps Quad Sets: AROM;Both;10 reps Straight Leg Raises: AAROM;Strengthening;Right;10 reps Long Arc Quad: AAROM;Left;10 reps    General Comments        Pertinent Vitals/Pain 4-5/10 Rt knee.  Premedicated. Repositioned for comfort.      Home Living                      Prior Function            PT  Goals (current goals can now be found in the care plan section) Acute Rehab PT Goals Patient Stated Goal: to go to rehab then home to get stronger  PT Goal Formulation: With patient Time For Goal Achievement: 06/03/14 Potential to Achieve Goals: Good Progress towards PT goals: Progressing toward goals (slowly)    Frequency  7X/week    PT Plan Current plan remains appropriate    Co-evaluation             End of Session   Activity Tolerance: Patient limited by fatigue;Patient limited by pain Patient left: in chair;with call bell/phone within reach;with family/visitor present     Time: 0830-0902 PT Time Calculation (min): 32 min  Charges:  $Therapeutic Activity: 23-37 mins                    G Codes:      Sena Hitch 06-19-2014, 9:32 AM  Sarajane Marek, PTA [phone] June 19, 2014

## 2014-05-29 NOTE — Progress Notes (Signed)
Removed 852mL from pt's bladder. Pt with some relief.

## 2014-05-29 NOTE — Progress Notes (Signed)
Spoke with the patient about cpap machine. Pt is going to have someone bring his machine from home for tonight. RN is Sherlynn Stalls is aware.

## 2014-05-29 NOTE — Plan of Care (Cosign Needed)
Notified by RN earlier re: pt request foley dc'd due to bladder spasm- I had ordered a B&O suppository but pt refused and RN dc'd foley- now pt c/o "constipation"- have ordered for bladder scan to r/o urinary retention and port abd to clarify if truly constipated vs leus vs SBO vs free air before give add'l bowel agents (has received Miralax,stool softener and suppository without results)  Addendum 646 am: KUB revealed diffuse ileus pattern and ? Some retained stool in right colon but not a significant amount-has been using quite a bit of Dialudid and may need to consider other agents-narcs likely causing urinary retention and ileus  Erin Hearing, ANP

## 2014-05-29 NOTE — Progress Notes (Signed)
Patient requested to have foley catheter removed due to painful bladder spasms. I explained the risks of removing it with a patient who has a UTI and acute retention and the possibility of having to put another foley back in but he still insisted. Paged the physician. Will continue to monitor. Rande Brunt Jaylinn Hellenbrand

## 2014-05-29 NOTE — Progress Notes (Signed)
Pt had a better day today bowels moved x 3. foley d/c voiding  with increased frequency and small  amount  at time. pain to right knee control . Hospital CPAP equipment  not working well  . Pt to bring own from home per RT. Pt resting comfortable. No c/o at this time.

## 2014-05-29 NOTE — Progress Notes (Signed)
[facility] PROGRESS NOTE  [name] XHB:[id number] DOB: 11/19/1941 DOA: 05/24/2014 PCP: [name], MD  Assessment/Plan: 1. Acute encephalopathy -Patient undergoing stroke workup which included an MRI of the brain which did not reveal acute intracranial findings. -I suspect acute encephalopathy may have resulted from multiple factors including effective psychotropic medications, underlying infectious process, acute on chronic renal failure and dehydration. -Symptoms resolved.  2. Acute on chronic renal failure -Suspect secondary to volume depletion/prerenal azotemia. -Labs showing improvement to kidney function with Creatinine trending down to 2.41 from 3.27 on AM labs  3. Ileus -Overnight events noted, KUB showing Ileus. Has been on Narcotics for pain management of left knee  -This morning having several large bowel movements, with abdomen less distended -Denies N/V -Will monitor  3. Anticoagulation -Kidney function improved, will restart his home dose of Xarelto at 15 mg PO q daily  4. Atrial fibrillation/flutter -Rate controlled, continue the Toprol 25 mg by mouth twice a day  5. Hyponatremia -Likely secondary to hypovolemic hyponatremia -Resolved  6. Urinary tract infection -Transition to oral Cipro  7. Right total knee arthroplasty -Orthopedic surgery following, physical therapy    Code Status: Full Code Family Communication: Spoke to family at bedside Disposition Plan: Given overnight events, will hold today's discharge and reassess in am   Consultants:  Neurology  Orthopedic Surgery   Antibiotics:  Cipro  HPI/Subjective: Patient is a 73 year old gentleman with a past medical history of coronary artery disease status post coronary artery bypass grafting, history of atrial fibrillation had been on anticoagulation therapy with Xarelto, aortic valve replacement, presented as a transfer from [facility] on 05/26/2014. Patient was  found to have changes in mental status, becoming confused. There was concern over possible right upper extremity weakness.    Objective: Filed Vitals:   05/29/14 1011  BP: 161/60  Pulse: 60  Temp: 98.8 F (37.1 C)  Resp: 18    Intake/Output Summary (Last 24 hours) at 05/29/14 1140 Last data filed at 05/29/14 1011  Gross per 24 hour  Intake   1880 ml  Output   4700 ml  Net  -2820 ml   Filed Weights   05/24/14 1640 05/27/14 0200  Weight: 119.75 kg (264 lb) 121.609 kg (268 lb 1.6 oz)    Exam:   General:  Patient is in no acute distress, awake alert oriented  Cardiovascular: Regular rate rhythm normal S1-S2  Respiratory: Clear to auscultation bilaterally  Abdomen: Soft nontender meds to  Musculoskeletal: 1+ edema to lower extremities bilaterally  Data Reviewed: Basic Metabolic Panel:  Recent Labs Lab 05/25/14 0436 05/26/14 0519 05/27/14 0015 05/28/14 0140 05/29/14 0500  NA 132* 134* 128* 128* 134*  K 5.6* 4.9 5.0 4.4 4.6  CL 102 103 96 99 104  CO2 18* 20 18* 18* 18*  GLUCOSE 218* 177* 189* 137* 215*  BUN 39* 45* 50* 53* 45*  CREATININE 2.11* 2.33* 3.40* 3.27* 2.41*  CALCIUM 8.4 8.6 8.2* 7.7* 8.0*   Liver Function Tests:  Recent Labs Lab 05/27/14 0015  AST 15  ALT 10  ALKPHOS 46  BILITOT 0.5  PROT 5.2*  ALBUMIN 2.7*   No results found for this basename: LIPASE, AMYLASE,  in the last 168 hours  Recent Labs Lab 05/27/14 0015  AMMONIA 28   CBC:  Recent Labs Lab 05/25/14 0436 05/26/14 0519 05/27/14 0015 05/28/14 0140 05/29/14 0500  WBC 9.5 9.2 16.5* 11.5* 7.9  NEUTROABS  --   --  14.2*  --   --  HGB 12.1* 10.4* 10.1* 8.9* 8.8*  HCT 33.7* 30.5* 28.2* 25.3* 25.6*  MCV 89.2 89.2 89.0 89.1 88.6  PLT 126* 121* 104* 87* 88*   Cardiac Enzymes: No results found for this basename: CKTOTAL, CKMB, CKMBINDEX, TROPONINI,  in the last 168 hours BNP (last 3 results) No results found for this basename: PROBNP,  in the last 8760  hours CBG:  Recent Labs Lab 05/27/14 2203 05/28/14 0652 05/28/14 1136 05/28/14 1708 05/28/14 2107  GLUCAP 125* 137* 207* 238* 258*    Recent Results (from the past 240 hour(s))  CULTURE, BLOOD (ROUTINE X 2)     Status: None   Collection Time    05/27/14 12:15 AM      Result Value Ref Range Status   Specimen Description BLOOD RIGHT ANTECUBITAL   Final   Special Requests BOTTLES DRAWN AEROBIC ONLY 10CC   Final   Culture  Setup Time     Final   Value: 05/27/2014 03:40     Performed at [facility]   Culture     Final   Value:        BLOOD CULTURE RECEIVED NO GROWTH TO DATE CULTURE WILL BE HELD FOR 5 DAYS BEFORE ISSUING A FINAL NEGATIVE REPORT     Performed at [facility]   Report Status PENDING   Incomplete  CULTURE, BLOOD (ROUTINE X 2)     Status: None   Collection Time    05/27/14 12:20 AM      Result Value Ref Range Status   Specimen Description BLOOD RIGHT HAND   Final   Special Requests BOTTLES DRAWN AEROBIC ONLY 10CC   Final   Culture  Setup Time     Final   Value: 05/27/2014 03:40     Performed at [facility]   Culture     Final   Value:        BLOOD CULTURE RECEIVED NO GROWTH TO DATE CULTURE WILL BE HELD FOR 5 DAYS BEFORE ISSUING A FINAL NEGATIVE REPORT     Performed at [facility]   Report Status PENDING   Incomplete  URINE CULTURE     Status: None   Collection Time    05/27/14  2:52 AM      Result Value Ref Range Status   Specimen Description URINE, RANDOM   Final   Special Requests NONE   Final   Culture  Setup Time     Final   Value: 05/27/2014 03:48     Performed at [facility]     Final   Value: 10,000 COLONIES/ML     Performed at [facility]   Culture     Final   Value: Multiple bacterial morphotypes present, none predominant. Suggest appropriate recollection if clinically indicated.     Performed at [facility]   Report Status 05/28/2014 FINAL   Final     Studies: Dg  Abd Portable 1v  05/29/2014   CLINICAL DATA:  Suprapubic abdominal pain.  EXAM: PORTABLE ABDOMEN - 1 VIEW  COMPARISON:  None.  FINDINGS: There are air-filled small bowel loops and colon throughout the abdomen and pelvis consistent with a diffuse ileus. No findings for obstruction or perforation.  IMPRESSION: Diffuse ileus bowel gas pattern.   Electronically Signed   By: Kalman Jewels M.D.   On: 05/29/2014 06:29    Scheduled Meds: . allopurinol  200 mg Oral Daily  . amLODipine  10 mg Oral  Q breakfast  . aspirin  81 mg Oral Daily  . ciprofloxacin  500 mg Oral Q breakfast  . clonazePAM  0.5 mg Oral Daily  . clonazePAM  1 mg Oral QHS  . docusate sodium  100 mg Oral BID  . insulin aspart  0-9 Units Subcutaneous TID WC  . metoprolol tartrate  25 mg Oral BID  . rivaroxaban  15 mg Oral Daily  . terazosin  10 mg Oral Q breakfast   Continuous Infusions:    Principal Problem:   OA (osteoarthritis) of knee Active Problems:   Sarcoidosis   OBSTRUCTIVE SLEEP APNEA   MYOCARDIAL INFARCTION   CORONARY HEART DISEASE   AORTIC STENOSIS   VALVULAR HEART DISEASE   Hx of CABG   Diabetes   Gout   BPH (benign prostatic hyperplasia)   Benign hypertensive heart disease without heart failure   Chronic kidney disease (CKD), stage III (moderate)   Weakness   Fever    Time spent: 25 min    [facility] Pager [phone]. If 7PM-7AM, please contact night-coverage at [email or web address], password [facility] 05/29/2014, 11:40 AM  LOS: 5 days

## 2014-05-30 DIAGNOSIS — D869 Sarcoidosis, unspecified: Secondary | ICD-10-CM

## 2014-05-30 LAB — COMPREHENSIVE METABOLIC PANEL
ALK PHOS: 69 U/L (ref 39–117)
ALT: 20 U/L (ref 0–53)
AST: 18 U/L (ref 0–37)
Albumin: 2.6 g/dL — ABNORMAL LOW (ref 3.5–5.2)
BILIRUBIN TOTAL: 0.6 mg/dL (ref 0.3–1.2)
BUN: 33 mg/dL — AB (ref 6–23)
CHLORIDE: 105 meq/L (ref 96–112)
CO2: 22 mEq/L (ref 19–32)
Calcium: 8.6 mg/dL (ref 8.4–10.5)
Creatinine, Ser: 2.06 mg/dL — ABNORMAL HIGH (ref 0.50–1.35)
GFR calc non Af Amer: 30 mL/min — ABNORMAL LOW (ref >90)
GFR, EST AFRICAN AMERICAN: 35 mL/min — AB (ref >90)
GLUCOSE: 181 mg/dL — AB (ref 70–99)
POTASSIUM: 5 meq/L (ref 3.7–5.3)
Sodium: 138 mEq/L (ref 137–147)
Total Protein: 6.1 g/dL (ref 6.0–8.3)

## 2014-05-30 LAB — CBC
HEMATOCRIT: 28.6 % — AB (ref 39.0–52.0)
Hemoglobin: 9.5 g/dL — ABNORMAL LOW (ref 13.0–17.0)
MCH: 29.8 pg (ref 26.0–34.0)
MCHC: 33.2 g/dL (ref 30.0–36.0)
MCV: 89.7 fL (ref 78.0–100.0)
Platelets: 129 10*3/uL — ABNORMAL LOW (ref 150–400)
RBC: 3.19 MIL/uL — ABNORMAL LOW (ref 4.22–5.81)
RDW: 14.7 % (ref 11.5–15.5)
WBC: 7.5 10*3/uL (ref 4.0–10.5)

## 2014-05-30 LAB — GLUCOSE, CAPILLARY: GLUCOSE-CAPILLARY: 169 mg/dL — AB (ref 70–99)

## 2014-05-30 MED ORDER — TRAMADOL HCL 50 MG PO TABS: 50.0000 mg | ORAL_TABLET | Freq: Four times a day (QID) | ORAL | Status: DC | PRN

## 2014-05-30 MED ORDER — DSS 100 MG PO CAPS: 100.0000 mg | ORAL_CAPSULE | Freq: Two times a day (BID) | ORAL | Status: DC

## 2014-05-30 MED ORDER — CLONAZEPAM 1 MG PO TABS: 1.0000 mg | ORAL_TABLET | Freq: Every day | ORAL | Status: DC

## 2014-05-30 MED ORDER — BISACODYL 10 MG RE SUPP: 10.0000 mg | Freq: Every day | RECTAL | Status: DC | PRN

## 2014-05-30 MED ORDER — ZOLPIDEM TARTRATE 10 MG PO TABS: 10.0000 mg | ORAL_TABLET | Freq: Every evening | ORAL | Status: DC | PRN

## 2014-05-30 MED ORDER — MORPHINE SULFATE 15 MG PO TABS: 15.0000 mg | ORAL_TABLET | ORAL | Status: DC | PRN

## 2014-05-30 NOTE — Progress Notes (Signed)
Patient is medically stable for D/C to [facility] today. Per Microbiologist at [place] patient is going to room [address] on [address]. Clinical Education officer, museum (CSW) prepared D/C packet and arranged EMS for transport. Patient's wife is at bedside and is aware of above. Nursing is aware of above. Please reconsult if further social work needs arise. CSW signing off.   Blima Rich, [place] Weekend CSW [phone]

## 2014-05-30 NOTE — Progress Notes (Signed)
CM called to f/u with pt D/C, she will call back when bed is available at [facility].  Will update pt.

## 2014-05-30 NOTE — Progress Notes (Signed)
Report called to Katharine Look, Therapist, sports at [facility]. All questions answered. Teresa Coombs, RN

## 2014-05-30 NOTE — Progress Notes (Signed)
Upon report, BP was 213/63 this am.  Retaken manually, 190/72.  AM BP meds given, will recheck within the hour.  MD aware.

## 2014-05-30 NOTE — Discharge Summary (Addendum)
Physician Discharge Summary  [name] DJS:[id number] DOB: 08-12-1941 DOA: 05/24/2014  PCP: [name] Miyamoto, MD  Admit date: 05/24/2014 Discharge date: 05/30/2014  Time spent: 35 minutes  Recommendations for Outpatient Follow-up:   1.  Please follow up on repeat BMP and CBC in 2-3 days. He was found to have acute on chronic renal failure as well as hyponatremia   2.  Patient to receive Cipro for 5 days, anticipated stop date Wed, June 02 2014   Discharge Diagnoses:  Principal Problem:   OA (osteoarthritis) of knee Active Problems:   Sarcoidosis   OBSTRUCTIVE SLEEP APNEA   MYOCARDIAL INFARCTION   CORONARY HEART DISEASE   AORTIC STENOSIS   VALVULAR HEART DISEASE   Hx of CABG   Diabetes   Gout   BPH (benign prostatic hyperplasia)   Benign hypertensive heart disease without heart failure   Chronic kidney disease (CKD), stage III (moderate)   Weakness   Fever   Discharge Condition: Stable  Diet recommendation: Heart healthy/Carb Modified  Filed Weights   05/24/14 1640 05/27/14 0200  Weight: 119.75 kg (264 lb) 121.609 kg (268 lb 1.6 oz)    History of present illness:  patient is a 73 year old male who comes in for a preoperative History and Physical. The patient is scheduled for a right total knee arthroplasty to be performed by Dr. Dione [name]. [name], MD at [facility] on 05-24-2014.  The patient is a 73 year old male who presents with knee complaints. The patient was seen for a second opinion. The patient reports right knee symptoms including: pain which began over 1 year(s) (1/2) ago following a specific injury. The injury occurred due to a fall. Prior to being seen today the patient was previously evaluated by a colleague (Dr. Almedia [name]) 1 year(s) ago (1/2). Previous work-up for this problem has included knee x-rays. Past treatment for this problem has included intra-articular injection of corticosteroids (and viscosupplementation, first series helped, 2nd did  not). Note for "Knee pain": Dr [name] referred him here to discuss surgery and consider knee replacement.  He's had pain in his knee for several years. It's gotten much worse in the past year to 2 years. He has seen Dr. Lynann [name], and she has treated him for his knee pain. She has performed cortisone and Visco supplement injections. The last series of Visco supplements did not help at all. [name] states that his knee is hurting at all times. It is limiting what he can and cannot do. His wife says he is nowhere near as active as he used to be and nowhere near as active as he needs to be because of this knee. He is at a stage now where he'd like to get this fixed in an effort to diminish his pain and improve his activity level. He is wishing to proceed with the knee replacement at this time.  They have been treated conservatively in the past for the above stated problem and despite conservative measures, they continue to have progressive pain and severe functional limitations and dysfunction. They have failed non-operative management including home exercise, medications, and injections. It is felt that they would benefit from undergoing total joint replacement. Risks and benefits of the procedure have been discussed with the patient and they elect to proceed with surgery. There are no active contraindications to surgery such as ongoing infection or rapidly progressive neurological disease.   Hospital Course:  Patient is a pleasant 73 year old woman with a past medical  history of coronary artery disease status post coronary artery bypass grafting, atrial flutter/fibrillation on Xarelto, status post aortic valve replacement with bioprosthetic mouth, type 2 diabetes mellitus who was initially admitted to the orthopedic service on 05/23/2014, undergoing a total right knee arthroplasty, procedure performed on 05/24/2014 by Dr. Wynelle [name] of orthopedic surgery. Patient tolerated the procedure well there are no immediate  complications. On 05/26/2014 patient was found to have a change in mental status, having slurred speech and possible left upper extremity weakness. Rapid response was called as CVA was suspected. He was seen and evaluated by neurology who recommended transfer from [facility] to [facility] for further evaluation. On 05/27/2014 he had an MRI of brain which did not show acute intracranial finding. I suspect the cause of his mental status changes were likely related to multiple factors including the effect of psychotropic medications, underlying infectious process, acute on chronic renal failure and dehydration. He was diagnosed with urinary tract infection and started on ciprofloxacin. Lab work revealed an upward trend in his creatinine peaking at 3.4 on 05/27/2014, with the development of hyponatremia having sodium of 128. After the initiation of empiric antimicrobial therapy, IV fluids, patient showed gradual improvement. Case discussed with Dr. Doy [name] of neurology who did not feel that presentation was related to underlying TIA/CVA and likely secondary to metabolic causes.   1. Acute encephalopathy -Patient undergoing stroke workup which included an MRI of the brain which did not reveal acute intracranial findings.  -I suspect acute encephalopathy may have resulted from multiple factors including effective psychotropic medications, underlying infectious process, acute on chronic renal failure and dehydration.  -Symptoms resolved 2. Acute on chronic renal failure  -Suspect secondary to volume depletion.  -Resolved, with his Creatinine coming down to 2.06 (baseline) on day of discharge 3. Anticoagulation  -Xarelto was restarted 4. Atrial fibrillation/flutter  -Rate controlled, continue the Toprol 25 mg by mouth twice a day  5. Hyponatremia  -Likely secondary to hypovolemic hyponatremia  -Sodium levels trended down from 134 on 05/26/2014 to 128 on 05/27/2014  -Resolved with IV fluid  administration 6. Urinary tract infection  -Continue Cipro IV with stop date 05/28/2014 7. Right total knee arthroplasty  -Orthopedic surgery following, tinea physical therapy   Procedures:  Right total knee arthroplasty, procedure performed on 05/24/2014  Consultations:  Orthopedic surgery  Neurology  Physical therapy/occupational therapy   Discharge Exam: Filed Vitals:   05/30/14 0900  BP: 150/56  Pulse: 61  Temp: 99.3 F (37.4 C)  Resp: 18    General: Patient is in no acute distress, awake alert oriented  Cardiovascular: Regular rate rhythm normal S1-S2  Respiratory: Clear to auscultation bilaterally  Abdomen: Soft nontender meds to  Musculoskeletal: 1+ edema to lower extremities bilaterally, s/p orthopedic procedure to right knee, no evidence of infection or hematoma   Discharge Instructions You were cared for by a hospitalist during your hospital stay. If you have any questions about your discharge medications or the care you received while you were in the hospital after you are discharged, you can call the unit and asked to speak with the hospitalist on call if the hospitalist that took care of you is not available. Once you are discharged, your primary care physician will handle any further medical issues. Please note that NO REFILLS for any discharge medications will be authorized once you are discharged, as it is imperative that you return to your primary care physician (or establish a relationship with a primary care physician if you  do not have one) for your aftercare needs so that they can reassess your need for medications and monitor your lab values.      Discharge Instructions   Call MD / Call 911    Complete by:  As directed   If you experience chest pain or shortness of breath, CALL 911 and be transported to the hospital emergency room.  If you develope a fever above 101 F, pus (white drainage) or increased drainage or redness at the wound, or calf pain, call  your surgeon's office.     Call MD for:  difficulty breathing, headache or visual disturbances    Complete by:  As directed      Call MD for:  extreme fatigue    Complete by:  As directed      Call MD for:  persistant dizziness or light-headedness    Complete by:  As directed      Call MD for:  persistant nausea and vomiting    Complete by:  As directed      Call MD for:  redness, tenderness, or signs of infection (pain, swelling, redness, odor or green/yellow discharge around incision site)    Complete by:  As directed      Call MD for:  temperature >100.4    Complete by:  As directed      Change dressing    Complete by:  As directed   Change dressing daily with sterile 4 x 4 inch gauze dressing and apply TED hose. Do not submerge the incision under water.     Constipation Prevention    Complete by:  As directed   Drink plenty of fluids.  Prune juice may be helpful.  You may use a stool softener, such as Colace (over the counter) 100 mg twice a day.  Use MiraLax (over the counter) for constipation as needed.     Diet - low sodium heart healthy    Complete by:  As directed      Diet - low sodium heart healthy    Complete by:  As directed      Diet Carb Modified    Complete by:  As directed      Discharge instructions    Complete by:  As directed   Pick up stool softner and laxative for home. Do not submerge incision under water. May shower starting Thursday Continue to use ice for pain and swelling from surgery.  The patient may resume his home Xarelto dosing along with the Aspirin.     Do not put a pillow under the knee. Place it under the heel.    Complete by:  As directed      Do not sit on low chairs, stoools or toilet seats, as it may be difficult to get up from low surfaces    Complete by:  As directed      Driving restrictions    Complete by:  As directed   No driving until released by the physician.     Increase activity slowly as tolerated    Complete by:  As  directed      Increase activity slowly    Complete by:  As directed      Lifting restrictions    Complete by:  As directed   No lifting until released by the physician.     Patient may shower    Complete by:  As directed   You may shower without a dressing once there is no  drainage.  Do not wash over the wound.  If drainage remains, do not shower until drainage stops.     TED hose    Complete by:  As directed   Use stockings (TED hose) for 3 weeks on both leg(s).  You may remove them at night for sleeping.     Weight bearing as tolerated    Complete by:  As directed             Medication List    STOP taking these medications       azithromycin 250 MG tablet  Commonly known as:  ZITHROMAX     diazepam 10 MG tablet  Commonly known as:  VALIUM     Glucosamine-Chondroitin 750-600 MG Tabs     HYDROmorphone 2 MG tablet  Commonly known as:  DILAUDID     multivitamin tablet     TESTOSTERONE IM     VOLTAREN 1 % Gel  Generic drug:  diclofenac sodium      TAKE these medications       acetaminophen 500 MG tablet  Commonly known as:  TYLENOL  Take 1,000 mg by mouth every 6 (six) hours as needed for moderate pain.     allopurinol 100 MG tablet  Commonly known as:  ZYLOPRIM  Take 200 mg by mouth daily with breakfast.     amLODipine 10 MG tablet  Commonly known as:  NORVASC  Take 10 mg by mouth daily with breakfast.     aspirin EC 81 MG tablet  Take 81 mg by mouth at bedtime.     bisacodyl 10 MG suppository  Commonly known as:  DULCOLAX  Place 1 suppository (10 mg total) rectally daily as needed for moderate constipation.     CALCIUM-MAGNESIUM-VITAMIN D ER PO  Take 1 tablet by mouth 2 (two) times daily.     ciprofloxacin 500 MG tablet  Commonly known as:  CIPRO  Take 1 tablet (500 mg total) by mouth daily with breakfast.     clonazePAM 1 MG tablet  Commonly known as:  KLONOPIN  Take 1 tablet (1 mg total) by mouth at bedtime.     diphenhydrAMINE 25 MG tablet   Commonly known as:  BENADRYL  Take 25-50 mg by mouth every 6 (six) hours as needed for allergies.     DSS 100 MG Caps  Take 100 mg by mouth 2 (two) times daily.     insulin glargine 100 UNIT/ML injection  Commonly known as:  LANTUS  Inject 1-6 Units into the skin daily with breakfast. Pt is on a sliding scale 100-120=1 unit; every 20 above 120 add a unit     lisinopril 40 MG tablet  Commonly known as:  PRINIVIL,ZESTRIL  Take 40 mg by mouth daily with breakfast.     Melatonin 5 MG Tabs  Take 15 mg by mouth at bedtime.     metFORMIN 500 MG tablet  Commonly known as:  GLUCOPHAGE  Take 500 mg by mouth 2 (two) times daily with a meal.     metoprolol tartrate 25 MG tablet  Commonly known as:  LOPRESSOR  Take 25 mg by mouth 2 (two) times daily.     metoprolol tartrate 25 MG tablet  Commonly known as:  LOPRESSOR  Take 1 tablet (25 mg total) by mouth 2 (two) times daily.     morphine 15 MG tablet  Commonly known as:  MSIR  Take 1 tablet (15 mg total) by mouth every 4 (four) hours  as needed for severe pain.     PRESCRIPTION MEDICATION  Apply 1 drop topically at bedtime. DMSO with Lamisil     Rivaroxaban 15 MG Tabs tablet  Commonly known as:  XARELTO  Take 15 mg by mouth daily with supper.     terazosin 10 MG capsule  Commonly known as:  HYTRIN  Take 10 mg by mouth daily with breakfast.     traMADol 50 MG tablet  Commonly known as:  ULTRAM  Take 1-2 tablets (50-100 mg total) by mouth every 6 (six) hours as needed for severe pain (mild to moderate pain). 50 to 100 prn     zolpidem 10 MG tablet  Commonly known as:  AMBIEN  Take 1 tablet (10 mg total) by mouth at bedtime as needed for sleep.       Allergies  Allergen Reactions  . Actos [Pioglitazone Hydrochloride] Swelling  . Penicillins Other (See Comments)    Serum sickness, swelling  . Amiodarone Other (See Comments)    diarrhea  . Latex Swelling  . Codeine     Makes Pt aggressive  . Depakote [Divalproex Sodium]  Diarrhea    severe  . Loop Diuretics     Spike in BUN levels  . Losartan Other (See Comments)  . Nsaids     Renal problems  . Orudis [Ketoprofen] Other (See Comments)    Cannot take due to renal insufficiency  . Pentazocine Lactate   . Poison Ivy Extract [employer] Poison Ivy]   . Rozerem [Ramelteon] Diarrhea    Severe   . Sertraline Hcl     Pt not sure  . Benazepril Hcl     ? Possible lowers plateletes  . Indomethacin Other (See Comments)    Renal problems   . Minocycline Other (See Comments)    Makes dizzy and feel just lousy.  . Potassium Iodide Rash    Itching   . Shellfish Allergy Rash   Follow-up Information   Follow up with Gearlean Alf, MD. Schedule an appointment as soon as possible for a visit in 2 weeks.   Specialty:  Orthopedic Surgery   Contact information:   [address] [place] [address] [phone]       Follow up with FRIED, [name], MD In 2 weeks.   Specialty:  Family Medicine   Contact information:   [place] [address] [phone]       Follow up with [name], MD In 1 week.   Specialty:  Cardiology   Contact information:   [address] [place] [address] [phone]       Follow up with COLADONATO,JOSEPH A, MD In 2 weeks.   Specialty:  Nephrology   Contact information:   [place] [address] [phone]        The results of significant diagnostics from this hospitalization (including imaging, microbiology, ancillary and laboratory) are listed below for reference.    Significant Diagnostic Studies: Dg Chest 2 View  05/18/2014   CLINICAL DATA:  Preop evaluation, prior aortic valve replacement coronary disease  EXAM: CHEST  2 VIEW  COMPARISON:  08/14/2011  FINDINGS: Prior coronary bypass changes noted and prosthetic aortic and tricuspid valves. Stable mild cardiac enlargement with central vascular congestion. No CHF, focal pneumonia, collapse or  consolidation. No edema, effusion or pneumothorax. Trachea is midline. Atherosclerosis noted of the aorta. Degenerative changes of the spine.  IMPRESSION: Stable postoperative changes.  Mild  cardiomegaly with vascular congestion  No superimposed acute process or interval change   Electronically Signed   By: Daryll Brod M.D.   On: 05/18/2014 15:39   Ct Head Wo Contrast  05/27/2014   CLINICAL DATA:  Upper extremity weakness.  EXAM: CT HEAD WITHOUT CONTRAST  TECHNIQUE: Contiguous axial images were obtained from the base of the skull through the vertex without intravenous contrast.  COMPARISON:  05/09/2012  FINDINGS: Skull and Sinuses:Negative for fracture or destructive process.  Orbits: Bilateral cataract resection.  Brain: No evidence of acute abnormality, such as acute infarction, hemorrhage, hydrocephalus, or mass lesion/mass effect. Generalized brain atrophy, stable from 2013. Incidental choroid plexus xanthogranulomas.  IMPRESSION: No acute intracranial findings.   Electronically Signed   By: Jorje Guild M.D.   On: 05/27/2014 00:07   Mri Brain Without Contrast  05/27/2014   CLINICAL DATA:  Right arm weakness with confusion. Multiple stroke risk factors including atrial fibrillation on Xarelto, also hypertension, diabetes, and coronary artery disease.  EXAM: MRI HEAD WITHOUT CONTRAST  MRA HEAD WITHOUT CONTRAST  TECHNIQUE: Multiplanar, multiecho pulse sequences of the brain and surrounding structures were obtained without intravenous contrast. Angiographic images of the head were obtained using MRA technique without contrast.  COMPARISON:  CT head 05/26/2014.  MR head 03/19/2008.  FINDINGS: MRI HEAD FINDINGS  Mild cerebral and cerebellar atrophy. Minimal white matter disease. No acute stroke, hemorrhage, mass lesion, or hydrocephalus. No extra-axial fluid. Flow voids are maintained. Partial empty sella. Bilateral cataract extraction. Extracranial soft tissues unremarkable.  Similar appearance to prior MR  and CT.  MRA HEAD FINDINGS  Internal carotid arteries are widely patent. Basilar artery widely patent with vertebrals codominant. Mild non stenotic irregularity of the anterior, middle, or posterior cerebral arteries. No cerebellar branch occlusion. No intracranial aneurysm.  IMPRESSION: Mild age-related changes as described. No acute intracranial findings.  No proximal flow reducing lesion is evident.   Electronically Signed   By: Rolla Flatten M.D.   On: 05/27/2014 10:57   Dg Chest Port 1 View  05/27/2014   CLINICAL DATA:  Fever, lethargy.  EXAM: PORTABLE CHEST - 1 VIEW  COMPARISON:  05/18/2014.  FINDINGS: Chronic cardiopericardial enlargement. Mediastinal contours are distorted by rightward rotation. Prominent interstitial markings, likely from interstitial crowding given low lung volumes. No definitive pneumonia. No effusion or pneumothorax. Left axillary surgical clips.  IMPRESSION: 1. No evidence of pneumonia. 2. Low lung volumes, which decreases diagnostic sensitivity.   Electronically Signed   By: Jorje Guild M.D.   On: 05/27/2014 00:09   Dg Abd Portable 1v  05/29/2014   CLINICAL DATA:  Suprapubic abdominal pain.  EXAM: PORTABLE ABDOMEN - 1 VIEW  COMPARISON:  None.  FINDINGS: There are air-filled small bowel loops and colon throughout the abdomen and pelvis consistent with a diffuse ileus. No findings for obstruction or perforation.  IMPRESSION: Diffuse ileus bowel gas pattern.   Electronically Signed   By: Kalman Jewels M.D.   On: 05/29/2014 06:29   Mr Jodene Nam Head/brain Wo Cm  05/27/2014   CLINICAL DATA:  Right arm weakness with confusion. Multiple stroke risk factors including atrial fibrillation on Xarelto, also hypertension, diabetes, and coronary artery disease.  EXAM: MRI HEAD WITHOUT CONTRAST  MRA HEAD WITHOUT CONTRAST  TECHNIQUE: Multiplanar, multiecho pulse sequences of the brain and surrounding structures were obtained without intravenous contrast. Angiographic images of the head were  obtained using MRA technique without contrast.  COMPARISON:  CT head 05/26/2014.  MR head 03/19/2008.  FINDINGS: MRI HEAD  FINDINGS  Mild cerebral and cerebellar atrophy. Minimal white matter disease. No acute stroke, hemorrhage, mass lesion, or hydrocephalus. No extra-axial fluid. Flow voids are maintained. Partial empty sella. Bilateral cataract extraction. Extracranial soft tissues unremarkable.  Similar appearance to prior MR and CT.  MRA HEAD FINDINGS  Internal carotid arteries are widely patent. Basilar artery widely patent with vertebrals codominant. Mild non stenotic irregularity of the anterior, middle, or posterior cerebral arteries. No cerebellar branch occlusion. No intracranial aneurysm.  IMPRESSION: Mild age-related changes as described. No acute intracranial findings.  No proximal flow reducing lesion is evident.   Electronically Signed   By: Rolla Flatten M.D.   On: 05/27/2014 10:57    Microbiology: Recent Results (from the past 240 hour(s))  CULTURE, BLOOD (ROUTINE X 2)     Status: None   Collection Time    05/27/14 12:15 AM      Result Value Ref Range Status   Specimen Description BLOOD RIGHT ANTECUBITAL   Final   Special Requests BOTTLES DRAWN AEROBIC ONLY 10CC   Final   Culture  Setup Time     Final   Value: 05/27/2014 03:40     Performed at [facility]   Culture     Final   Value:        BLOOD CULTURE RECEIVED NO GROWTH TO DATE CULTURE WILL BE HELD FOR 5 DAYS BEFORE ISSUING A FINAL NEGATIVE REPORT     Performed at [facility]   Report Status PENDING   Incomplete  CULTURE, BLOOD (ROUTINE X 2)     Status: None   Collection Time    05/27/14 12:20 AM      Result Value Ref Range Status   Specimen Description BLOOD RIGHT HAND   Final   Special Requests BOTTLES DRAWN AEROBIC ONLY 10CC   Final   Culture  Setup Time     Final   Value: 05/27/2014 03:40     Performed at [facility]   Culture     Final   Value:        BLOOD CULTURE RECEIVED NO GROWTH TO  DATE CULTURE WILL BE HELD FOR 5 DAYS BEFORE ISSUING A FINAL NEGATIVE REPORT     Performed at [facility]   Report Status PENDING   Incomplete  URINE CULTURE     Status: None   Collection Time    05/27/14  2:52 AM      Result Value Ref Range Status   Specimen Description URINE, RANDOM   Final   Special Requests NONE   Final   Culture  Setup Time     Final   Value: 05/27/2014 03:48     Performed at [facility]     Final   Value: 10,000 COLONIES/ML     Performed at [facility]   Culture     Final   Value: Multiple bacterial morphotypes present, none predominant. Suggest appropriate recollection if clinically indicated.     Performed at [facility]   Report Status 05/28/2014 FINAL   Final     Labs: Basic Metabolic Panel:  Recent Labs Lab 05/26/14 0519 05/27/14 0015 05/28/14 0140 05/29/14 0500 05/30/14 0456  NA 134* 128* 128* 134* 138  K 4.9 5.0 4.4 4.6 5.0  CL 103 96 99 104 105  CO2 20 18* 18* 18* 22  GLUCOSE 177* 189* 137* 215* 181*  BUN 45* 50* 53* 45* 33*  CREATININE 2.33* 3.40*  3.27* 2.41* 2.06*  CALCIUM 8.6 8.2* 7.7* 8.0* 8.6   Liver Function Tests:  Recent Labs Lab 05/27/14 0015 05/30/14 0456  AST 15 18  ALT 10 20  ALKPHOS 46 69  BILITOT 0.5 0.6  PROT 5.2* 6.1  ALBUMIN 2.7* 2.6*   No results found for this basename: LIPASE, AMYLASE,  in the last 168 hours  Recent Labs Lab 05/27/14 0015  AMMONIA 28   CBC:  Recent Labs Lab 05/26/14 0519 05/27/14 0015 05/28/14 0140 05/29/14 0500 05/30/14 0456  WBC 9.2 16.5* 11.5* 7.9 7.5  NEUTROABS  --  14.2*  --   --   --   HGB 10.4* 10.1* 8.9* 8.8* 9.5*  HCT 30.5* 28.2* 25.3* 25.6* 28.6*  MCV 89.2 89.0 89.1 88.6 89.7  PLT 121* 104* 87* 88* 129*   Cardiac Enzymes: No results found for this basename: CKTOTAL, CKMB, CKMBINDEX, TROPONINI,  in the last 168 hours BNP: BNP (last 3 results) No results found for this basename: PROBNP,  in the last 8760  hours CBG:  Recent Labs Lab 05/29/14 0658 05/29/14 1149 05/29/14 1606 05/29/14 2135 05/30/14 0649  GLUCAP 193* 170* 174* 209* 169*       Signed:  [facility] 05/30/2014, 11:22 AM

## 2014-05-30 NOTE — Progress Notes (Signed)
[facility] arrived to take pt to [facility]. Pt, family aware. Bedside RN, charge RN aware. Pt stable. Teresa Coombs, RN

## 2014-05-30 NOTE — Progress Notes (Signed)
Patient ID: [name], male   DOB: 1941/01/20, 73 y.o.   MRN: [id number] Subjective: 6 Days Post-Op Procedure(s) (LRB): RIGHT TOTAL KNEE ARTHROPLASTY (Right)    Patient reports pain as moderate. Biggest issue is that related to overflow incontinence   Objective:   VITALS:   Filed Vitals:   05/30/14 0739  BP: 190/72  Pulse:   Temp:   Resp:     Neurovascular intact Incision: dressing C/D/I  LABS  Recent Labs  05/28/14 0140 05/29/14 0500 05/30/14 0456  HGB 8.9* 8.8* 9.5*  HCT 25.3* 25.6* 28.6*  WBC 11.5* 7.9 7.5  PLT 87* 88* 129*     Recent Labs  05/28/14 0140 05/29/14 0500 05/30/14 0456  NA 128* 134* 138  K 4.4 4.6 5.0  BUN 53* 45* 33*  CREATININE 3.27* 2.41* 2.06*  GLUCOSE 137* 215* 181*    No results found for this basename: LABPT, INR,  in the last 72 hours   Assessment/Plan: 6 Days Post-Op Procedure(s) (LRB): RIGHT TOTAL KNEE ARTHROPLASTY (Right)   Up with therapy Discharge to SNF , [place] most likely tomorrow "dealing" with incontinence issues best he can

## 2014-05-30 NOTE — Progress Notes (Signed)
Physical Therapy Treatment Patient Details Name: [name] MRN: [id number] DOB: [birth year] Today's Date: 05/30/2014    History of Present Illness 73 yo male s/p R TKA 05/24/14 pt transferred to [facility] 6/3 secondary to new onset of generalized weakness. Stroke workup underway . hx o MI, CABG, DM, gout, arotic stenosis.     PT Comments    Pt progressing with mobility. Continues to require premedication due to increased pain with activity. Cont need for SNF for post acute rehab.  Follow Up Recommendations  SNF     Equipment Recommendations  None recommended by PT    Recommendations for Other Services OT consult     Precautions / Restrictions Precautions Precautions: Knee;Fall Required Braces or Orthoses: Knee Immobilizer - Right Knee Immobilizer - Right: Discontinue once straight leg raise with < 10 degree lag Restrictions Weight Bearing Restrictions: Yes RLE Weight Bearing: Weight bearing as tolerated    Mobility  Bed Mobility Overal bed mobility: Modified Independent Bed Mobility: Supine to Sit;Sit to Supine           General bed mobility comments: requires incr time; use of handrails required  Transfers Overall transfer level: Needs assistance Equipment used: Rolling walker (2 wheeled) Transfers: Sit to/from Stand Sit to Stand: Min assist         General transfer comment: use of rocking technique to shift weight anteriorly; cues for safety and hand placement   Ambulation/Gait Ambulation/Gait assistance: Min assist Ambulation Distance (Feet): 250 Feet Assistive device: Rolling walker (2 wheeled) Gait Pattern/deviations: Decreased stance time - left;Decreased step length - right;Antalgic;Wide base of support;Trunk flexed Gait velocity: decreased due to pain  Gait velocity interpretation: Below normal speed for age/gender General Gait Details: cues for upright posture and gt sequencing; standing rest break due to fatigue    Stairs             Wheelchair Mobility    Modified Rankin (Stroke Patients Only)       Balance Overall balance assessment: Needs assistance Sitting-balance support: Feet supported;No upper extremity supported Sitting balance-Leahy Scale: Good     Standing balance support: During functional activity;Bilateral upper extremity supported Standing balance-Leahy Scale: Poor Standing balance comment: definite use of UEs                     Cognition Arousal/Alertness: Awake/alert Behavior During Therapy: Impulsive Overall Cognitive Status: Within Functional Limits for tasks assessed                      Exercises      General Comments        Pertinent Vitals/Pain "about a half"     Home Living                      Prior Function            PT Goals (current goals can now be found in the care plan section) Acute Rehab PT Goals Patient Stated Goal: to go to rehab then home to get stronger  PT Goal Formulation: With patient Time For Goal Achievement: 06/03/14 Potential to Achieve Goals: Good Progress towards PT goals: Progressing toward goals    Frequency  7X/week    PT Plan Current plan remains appropriate    Co-evaluation             End of Session Equipment Utilized During Treatment: Right knee immobilizer;Gait belt Activity Tolerance: Patient tolerated treatment well Patient left: in bed;with  call bell/phone within reach;with family/visitor present;with bed alarm set     Time: 1103-1120 PT Time Calculation (min): 17 min  Charges:  $Gait Training: 8-22 mins                    G Codes:      [place], [place]  [phone] 05/30/2014, 12:02 PM

## 2014-05-30 NOTE — Progress Notes (Signed)
Pt transported via [facility]. Pt alert, stable. Family, bedside RN, charge RN aware. Teresa Coombs, RN

## 2014-05-31 ENCOUNTER — Encounter: Payer: Self-pay | Admitting: *Deleted

## 2014-05-31 ENCOUNTER — Other Ambulatory Visit: Payer: Self-pay | Admitting: *Deleted

## 2014-05-31 ENCOUNTER — Encounter: Payer: Self-pay | Admitting: Adult Health

## 2014-05-31 ENCOUNTER — Non-Acute Institutional Stay (SKILLED_NURSING_FACILITY): Payer: Medicare Other | Admitting: Adult Health

## 2014-05-31 DIAGNOSIS — I2510 Atherosclerotic heart disease of native coronary artery without angina pectoris: Secondary | ICD-10-CM

## 2014-05-31 DIAGNOSIS — G4700 Insomnia, unspecified: Secondary | ICD-10-CM | POA: Insufficient documentation

## 2014-05-31 DIAGNOSIS — I4892 Unspecified atrial flutter: Secondary | ICD-10-CM

## 2014-05-31 DIAGNOSIS — N183 Chronic kidney disease, stage 3 unspecified: Secondary | ICD-10-CM

## 2014-05-31 DIAGNOSIS — F411 Generalized anxiety disorder: Secondary | ICD-10-CM

## 2014-05-31 DIAGNOSIS — N390 Urinary tract infection, site not specified: Secondary | ICD-10-CM

## 2014-05-31 DIAGNOSIS — E119 Type 2 diabetes mellitus without complications: Secondary | ICD-10-CM

## 2014-05-31 DIAGNOSIS — F419 Anxiety disorder, unspecified: Secondary | ICD-10-CM | POA: Insufficient documentation

## 2014-05-31 DIAGNOSIS — M109 Gout, unspecified: Secondary | ICD-10-CM

## 2014-05-31 DIAGNOSIS — M179 Osteoarthritis of knee, unspecified: Secondary | ICD-10-CM

## 2014-05-31 DIAGNOSIS — I119 Hypertensive heart disease without heart failure: Secondary | ICD-10-CM

## 2014-05-31 DIAGNOSIS — IMO0002 Reserved for concepts with insufficient information to code with codable children: Secondary | ICD-10-CM

## 2014-05-31 DIAGNOSIS — M1710 Unilateral primary osteoarthritis, unspecified knee: Secondary | ICD-10-CM

## 2014-05-31 MED ORDER — HYDROMORPHONE HCL 4 MG PO TABS: ORAL_TABLET | ORAL | Status: DC

## 2014-05-31 MED ORDER — TESTOSTERONE CYPIONATE 200 MG/ML IM SOLN: INTRAMUSCULAR | Status: DC

## 2014-05-31 MED ORDER — MORPHINE SULFATE 15 MG PO TABS: ORAL_TABLET | ORAL | Status: DC

## 2014-05-31 NOTE — Progress Notes (Signed)
Patient ID: MARILYN WING, male   DOB: 10/31/1941, 73 y.o.   MRN: [id number]               PROGRESS NOTE  DATE: 05/31/2014  FACILITY: Nursing Home Location: [facility]  LEVEL OF CARE: SNF (31)  Acute Visit  CHIEF COMPLAINT:  Follow-up Hospitalization  HISTORY OF PRESENT ILLNESS: This is a 73 year old male who has been admitted to [facility] on 05/30/14 from [facility] with Osteoarthritis S/P Right total knee arthroplasty. He has been admitted for a short-term rehabilitation.  REASSESSMENT OF ONGOING PROBLEM(S):  DM:pt's DM remains stable.  Pt denies polyuria, polydipsia, polyphagia, changes in vision or hypoglycemic episodes.  No complications noted from the medication presently being used.   6/15 hemoglobin A1c is: 6.8  GOUT: Patien'st  gout remains stable. Patient denies joint pan, redness, swelling or warmth. No complications reported from the medications presently being used.  ATRIAL FIBRILLATION: the patients atrial fibrillation remains stable.  The patient denies DOE, tachycardia, orthopnea, transient neurological sx, pedal edema, palpitations, & PNDs.  No complications noted from the medications currently being used.   PAST MEDICAL HISTORY : Reviewed.  No changes/see problem list  CURRENT MEDICATIONS: Reviewed per MAR/see medication list  REVIEW OF SYSTEMS:  GENERAL: no change in appetite, no fatigue, no weight changes, no fever, chills or weakness RESPIRATORY: no cough, SOB, DOE, wheezing, hemoptysis CARDIAC: no chest pain, or palpitations, + edema GI: no abdominal pain, diarrhea, constipation, heart burn, nausea or vomiting  PHYSICAL EXAMINATION  GENERAL: no acute distress, normal body habitus EYES: conjunctivae normal, sclerae normal, normal eye lids NECK: supple, trachea midline, no neck masses, no thyroid tenderness, no thyromegaly LYMPHATICS: no LAN in the neck, no supraclavicular LAN RESPIRATORY: breathing is even & unlabored, BS  CTAB CARDIAC: RRR, no murmur,no extra heart sounds, BLE edema 1+ GI: abdomen soft, normal BS, no masses, no tenderness, no hepatomegaly, no splenomegaly EXTREMITIES: able to move all 4 extremities; limited ROM on RLE due to surgery PSYCHIATRIC: the patient is alert & oriented to person, affect & behavior appropriate  LABS/RADIOLOGY: Labs reviewed: Basic Metabolic Panel:  Recent Labs  05/28/14 0140 05/29/14 0500 05/30/14 0456  NA 128* 134* 138  K 4.4 4.6 5.0  CL 99 104 105  CO2 18* 18* 22  GLUCOSE 137* 215* 181*  BUN 53* 45* 33*  CREATININE 3.27* 2.41* 2.06*  CALCIUM 7.7* 8.0* 8.6   Liver Function Tests:  Recent Labs  05/18/14 1500 05/27/14 0015 05/30/14 0456  AST 22 15 18   ALT 15 10 20   ALKPHOS 62 46 69  BILITOT 0.2* 0.5 0.6  PROT 6.6 5.2* 6.1  ALBUMIN 3.5 2.7* 2.6*    Recent Labs  05/27/14 0015  AMMONIA 28   CBC:  Recent Labs  12/28/13 1548  05/27/14 0015 05/28/14 0140 05/29/14 0500 05/30/14 0456  WBC 5.8  < > 16.5* 11.5* 7.9 7.5  NEUTROABS 4.0  --  14.2*  --   --   --   HGB 14.2  < > 10.1* 8.9* 8.8* 9.5*  HCT 42.5  < > 28.2* 25.3* 25.6* 28.6*  MCV 92.8  < > 89.0 89.1 88.6 89.7  PLT 155.0  < > 104* 87* 88* 129*  < > = values in this interval not displayed.  Lipid Panel:  Recent Labs  05/27/14 0015 05/28/14 0140  HDL 30* 14*   CBG:  Recent Labs  05/29/14 1606 05/29/14 2135 05/30/14 0649  GLUCAP 174* 209* 169*  ASSESSMENT/PLAN:  Osteoarthritis S/P Right total knee arthroplasty - for rehabilitation CAD - continue ASA Atrial Flutter/Fibrillation - rate controlled; continue Xarelto and Metoprolol Diabetes Mellitus, type 2 - continue Lantus, Humalog and Metformin CKD, stage 3 - check BMP UTI - continue Ciprofloxacin Hypertension - well-controlled; continue Amlodipine, Lisinopril and metoprolol Gout - continue allopurinol Anxiety - stable; continue Klonopin Insomnia - continue Melatonin and Ambien BPH - continue Hytrin   CPT  CODE: 88757  Trevyn Lumpkin Vargas - NP [facility] [phone]

## 2014-05-31 NOTE — Telephone Encounter (Signed)
[facility] 

## 2014-06-01 ENCOUNTER — Non-Acute Institutional Stay (SKILLED_NURSING_FACILITY): Payer: Medicare Other | Admitting: Internal Medicine

## 2014-06-01 DIAGNOSIS — M179 Osteoarthritis of knee, unspecified: Secondary | ICD-10-CM

## 2014-06-01 DIAGNOSIS — M1710 Unilateral primary osteoarthritis, unspecified knee: Secondary | ICD-10-CM

## 2014-06-01 DIAGNOSIS — D62 Acute posthemorrhagic anemia: Secondary | ICD-10-CM

## 2014-06-01 DIAGNOSIS — E1059 Type 1 diabetes mellitus with other circulatory complications: Secondary | ICD-10-CM

## 2014-06-01 DIAGNOSIS — IMO0002 Reserved for concepts with insufficient information to code with codable children: Secondary | ICD-10-CM

## 2014-06-01 DIAGNOSIS — E1051 Type 1 diabetes mellitus with diabetic peripheral angiopathy without gangrene: Secondary | ICD-10-CM | POA: Insufficient documentation

## 2014-06-01 DIAGNOSIS — I2510 Atherosclerotic heart disease of native coronary artery without angina pectoris: Secondary | ICD-10-CM

## 2014-06-01 NOTE — Progress Notes (Signed)
HISTORY & PHYSICAL  DATE: 06/01/2014   FACILITY: [facility]  LEVEL OF CARE: SNF (31)  ALLERGIES:  Allergies  Allergen Reactions  . Actos [Pioglitazone Hydrochloride] Swelling  . Penicillins Other (See Comments)    Serum sickness, swelling  . Amiodarone Other (See Comments)    diarrhea  . Latex Swelling  . Codeine     Makes Pt aggressive  . Depakote [Divalproex Sodium] Diarrhea    severe  . Loop Diuretics     Spike in BUN levels  . Losartan Other (See Comments)  . Nsaids     Renal problems  . Orudis [Ketoprofen] Other (See Comments)    Cannot take due to renal insufficiency  . Pentazocine Lactate   . Poison Ivy Extract [employer] Poison Ivy]   . Rozerem [Ramelteon] Diarrhea    Severe   . Sertraline Hcl     Pt not sure  . Benazepril Hcl     ? Possible lowers plateletes  . Indomethacin Other (See Comments)    Renal problems   . Minocycline Other (See Comments)    Makes dizzy and feel just lousy.  . Potassium Iodide Rash    Itching   . Shellfish Allergy Rash    CHIEF COMPLAINT:  Manage right knee osteoarthritis, CAD and diabetes mellitus  HISTORY OF PRESENT ILLNESS: Patient is a 73 year old Caucasian male.  KNEE OSTEOARTHRITIS: Patient had a history of pain and functional disability in the knee due to end-stage osteoarthritis and has failed nonsurgical conservative treatments. Patient had worsening of pain with activity and weight bearing, pain that interfered with activities of daily living & pain with passive range of motion. Therefore patient underwent total knee arthroplasty and tolerated the procedure well. Patient is admitted to this facility for sort short-term rehabilitation. Patient denies knee pain.  CAD: The angina has been stable. The patient denies dyspnea on exertion, orthopnea, palpitations and paroxysmal nocturnal dyspnea. No complications noted from the medication presently being used. Complains of chronic lower  extremity swelling.  DM:pt's DM remains stable.  Pt denies polyuria, polydipsia, polyphagia, changes in vision or hypoglycemic episodes.  No complications noted from the medication presently being used.  Last hemoglobin A1c is: Not available.  PAST MEDICAL HISTORY :  Past Medical History  Diagnosis Date  . Coronary heart disease     stent, CABG, RBBB  . Valvular heart disease     aortic stenosis/regurgitation  . Sarcoid 2011    pulmonary and bone marrow  . Hypercalcemia 2011    due to sarcoidiosis  . Bone marrow disease   . Diabetes mellitus   . Hemorrhagic cystitis 2012  . Gout   . HTN (hypertension)   . A-fib     controlled with medication  . Myocardial infarction     1995, 2002, 2006  . OSA (obstructive sleep apnea)     use bipap setting of 10 and 12  . Complication of anesthesia 11-16-13    required alot of versed per anesthesia with cataract surgery  . Chronic kidney disease (CKD), stage III (moderate)     lov note dr Koleen Nimrod nephrology 09-17-13 on chart  . Heart murmur   . Atrial flutter     hx of  . CHF (congestive heart failure)   . Pneumonia 1966, 2011    hx of  . Anxiety   . Osteoarthritis   . DDD (degenerative disc disease)   . Synovial cyst of lumbar spine  l3-l4, l4-l5, injections  . Anemia   . Hepatitis     mono  . Cancer     basal cell  . Right sciatic nerve pain     for block thursday 01-21-2014, injections  . Fungal toenail infection   . Biceps tendon tear     right  . Diabetes     PAST SURGICAL HISTORY: Past Surgical History  Procedure Laterality Date  . Cataract surgery Right 2007  . Prostate surgery  oct. 2012    TURP  . Vasectomy  1977  . Redo median sternotomy, extracorporeal cirulation, avr, tricuspid valve repair  06/13/2011    AVR(23-mm Edwards pericardial Magna-Ease valve./ TVrepair (34-mm Edwards MC3 annuloplasty ring  . Coronary artery bypass graft  2006    x 5  . Cataract surgery Left 11-16-13  . Cardioversion N/A  12/30/2013    Procedure: CARDIOVERSION;  Surgeon: [name], MD;  Location: [facility] ENDOSCOPY;  Service: Cardiovascular;  Laterality: N/A;  10:49 cardioversion at 120 joules, then 150 joules, to SB  used  Lido 30mg ,  Propofol 160 mcg  . Colonoscopy N/A 01/29/2014    Procedure: COLONOSCOPY;  Surgeon: Winfield Cunas., MD;  Location: [facility] ENDOSCOPY;  Service: Endoscopy;  Laterality: N/A;  amanda//ja  . Cardioversion  2003, 2006, 2012, 2013  . Portacath placement    . Portacath removed    . Basal cell carcinoma excision Right 2015    ear  . Total knee arthroplasty Right 05/24/2014    Procedure: RIGHT TOTAL KNEE ARTHROPLASTY;  Surgeon: Gearlean Alf, MD;  Location: [facility] ORS;  Service: Orthopedics;  Laterality: Right;    SOCIAL HISTORY:  reports that he quit smoking about 18 years ago. His smoking use included Pipe and Cigars. He has never used smokeless tobacco. He reports that he drinks alcohol. He reports that he does not use illicit drugs.  FAMILY HISTORY:  Family History  Problem Relation Age of Onset  . Heart attack Father   . Allergies    . Asthma    . Heart disease    . Cancer      CURRENT MEDICATIONS: Reviewed per MAR/see medication list  REVIEW OF SYSTEMS:  See HPI otherwise 14 point ROS is negative.  PHYSICAL EXAMINATION  VS:  See VS section  GENERAL: no acute distress, normal body habitus EYES: conjunctivae normal, sclerae normal, normal eye lids MOUTH/THROAT: lips without lesions,no lesions in the mouth,tongue is without lesions,uvula elevates in midline NECK: supple, trachea midline, no neck masses, no thyroid tenderness, no thyromegaly LYMPHATICS: no LAN in the neck, no supraclavicular LAN RESPIRATORY: breathing is even & unlabored, BS CTAB CARDIAC: RRR, grade 2/6 murmur heard in all 4 quadrants, no radiation, no extra heart sounds, right lower extremity +3 edema in the left lower extremity +2 edema GI:  ABDOMEN: abdomen soft, normal BS, no masses, no tenderness    LIVER/SPLEEN: no hepatomegaly, no splenomegaly MUSCULOSKELETAL: HEAD: normal to inspection  EXTREMITIES: LEFT UPPER EXTREMITY: full range of motion, normal strength & tone RIGHT UPPER EXTREMITY:  full range of motion, normal strength & tone LEFT LOWER EXTREMITY:  full range of motion, normal strength & tone RIGHT LOWER EXTREMITY:  range of motion not tested due to surgery, normal strength & tone PSYCHIATRIC: the patient is alert & oriented to person, affect & behavior appropriate  LABS/RADIOLOGY:  Labs reviewed: Basic Metabolic Panel:  Recent Labs  05/28/14 0140 05/29/14 0500 05/30/14 0456  NA 128* 134* 138  K 4.4 4.6 5.0  CL 99  104 105  CO2 18* 18* 22  GLUCOSE 137* 215* 181*  BUN 53* 45* 33*  CREATININE 3.27* 2.41* 2.06*  CALCIUM 7.7* 8.0* 8.6   Liver Function Tests:  Recent Labs  05/18/14 1500 05/27/14 0015 05/30/14 0456  AST 22 15 18   ALT 15 10 20   ALKPHOS 62 46 69  BILITOT 0.2* 0.5 0.6  PROT 6.6 5.2* 6.1  ALBUMIN 3.5 2.7* 2.6*    Recent Labs  05/27/14 0015  AMMONIA 28   CBC:  Recent Labs  12/28/13 1548  05/27/14 0015 05/28/14 0140 05/29/14 0500 05/30/14 0456  WBC 5.8  < > 16.5* 11.5* 7.9 7.5  NEUTROABS 4.0  --  14.2*  --   --   --   HGB 14.2  < > 10.1* 8.9* 8.8* 9.5*  HCT 42.5  < > 28.2* 25.3* 25.6* 28.6*  MCV 92.8  < > 89.0 89.1 88.6 89.7  PLT 155.0  < > 104* 87* 88* 129*  < > = values in this interval not displayed.  Lipid Panel:  Recent Labs  05/27/14 0015 05/28/14 0140  HDL 30* 14*   CBG:  Recent Labs  05/29/14 1606 05/29/14 2135 05/30/14 0649  GLUCAP 174* 209* 169*    Transthoracic Echocardiography  Patient:    Ponce, Skillman MR #:       [id number] Study Date: 05/28/2014 Gender:     M Age:        1 Height:     180.3 cm Weight:     121.6 kg BSA:        2.51 m^2 Pt. Status: Room:       4N07C   ADMITTING    Shirlean Kelly  REFERRING    Rheems N  SONOGRAPHER   Tresa Res, [place], [place]  PERFORMING   [facility], Inpatient  cc:  ------------------------------------------------------------------- LV EF: 60% -   65%  ------------------------------------------------------------------- Indications:      CVA 70.  ------------------------------------------------------------------- History:   PMH:   Aortic valve disease.  PMH:   Myocardial infarction.  Risk factors:  Former tobacco use. Diabetes mellitus.   ------------------------------------------------------------------- Study Conclusions  - Left ventricle: The cavity size was normal. There was moderate   concentric hypertrophy. Systolic function was normal. The   estimated ejection fraction was in the range of 60% to 65%.   Doppler parameters are consistent with diastolic dysfunction and   elevated LV filling pressure. - Aortic valve: Poorly visualized - calcified leaflets. There is at   least moderate aortic stenosis (peak and mean gradients of 51 and   29 mmHg, respectively. There was no regurgitation. - Aorta: The aorta was mildly calcified. - Mitral valve: Heavily calcified annulus. Mild mitral stenosis -   peak and mean gradients of 13 and 6 mmHg, respectively. There was   mild regurgitation. - Left atrium: Severely dilated (60 ml/m2). - Right atrium: Moderately dilated. - Tricuspid valve: There was mild regurgitation. TR envelope   inadequate to calculate RVSP.  Impressions:  - LVEF 60-65%, severe LAE, moderate aortic stenosis, mild mitral   stensosis, diastolic dysfunction and elevated LV filling   pressure.  ------------------------------------------------------------------- Labs, prior tests, procedures, and surgery: Coronary artery bypass grafting.     Aortic valve replacement with a bioprosthetic valve.  Transthoracic echocardiography.  M-mode, complete 2D, spectral Doppler, and color Doppler.  Birthdate:  Patient birthdate: 10-Nov-1941.  Age:   Patient is  73 yr old.  Sex:  Gender: male. Height:  Height: 180.3 cm. Height: 71 in.  Weight:  Weight: 121.6 kg. Weight: 267.4 lb.  Body mass index:  BMI: 37.4 kg/m^2.  Body surface area:    BSA: 2.51 m^2.  Blood pressure:     182/70 Patient status:  Inpatient.  Study date:  Study date: 05/28/2014. Study time: 12:29 PM.  Location:  Bedside.  -------------------------------------------------------------------  ------------------------------------------------------------------- Left ventricle:  The cavity size was normal. There was moderate concentric hypertrophy. Systolic function was normal. The estimated ejection fraction was in the range of 60% to 65%. Doppler parameters are consistent with diastolic dysfunction and elevated LV filling pressure.  ------------------------------------------------------------------- Aortic valve:  Poorly visualized - calcified leaflets. There is at least moderate aortic stenosis (peak and mean gradients of 51 and 29 mmHg, respectively.  Doppler:  There was no regurgitation. Mean gradient (S): 29 mm Hg. Peak gradient (S): 51 mm Hg.  ------------------------------------------------------------------- Aorta:  The aorta was mildly calcified. Aortic root: The aortic root was normal in size. Ascending aorta: The ascending aorta was normal in size.  ------------------------------------------------------------------- Mitral valve:  Heavily calcified annulus. Mild mitral stenosis - peak and mean gradients of 13 and 6 mmHg, respectively.  Doppler: There was mild regurgitation.    Indexed valve area by pressure half-time: 0.81 cm^2/m^2.    Mean gradient (D): 6 mm Hg. Peak gradient (D): 11 mm Hg.  ------------------------------------------------------------------- Left atrium:  Severely dilated (60 ml/m2).  ------------------------------------------------------------------- Atrial septum:  No defect or patent foramen ovale was identified.     ------------------------------------------------------------------- Right ventricle:  The cavity size was normal. Wall thickness was normal. Systolic function was normal.  ------------------------------------------------------------------- Tricuspid valve:   Doppler:  There was mild regurgitation. TR envelope inadequate to calculate RVSP.  ------------------------------------------------------------------- Right atrium:  Moderately dilated.  ------------------------------------------------------------------- Pericardium:  There was no pericardial effusion.  ------------------------------------------------------------------- Systemic veins: Inferior vena cava: The vessel was dilated. The respirophasic diameter changes were blunted (< 50%), consistent with elevated central venous pressure.  ------------------------------------------------------------------- Post procedure conclusions Ascending Aorta:  - The aorta was mildly calcified.  ------------------------------------------------------------------- Prepared and Electronically Authenticated by  Lyman Bishop MD 2015-06-05T14:26:01  ------------------------------------------------------------------- Measurements   Left ventricle                 Value          12/28/2013 Reference  LV ID, ED, PLAX      (N)       45.7  mm       48.1       43 - 52  chordal  LV ID, ES, PLAX      (N)       33.6  mm       35.6       23 - 38  chordal  LV fx shortening,    (L)       26    %        26         >=29  PLAX chordal  LV PW thickness, ED            15.3  mm       15.9       ---------  IVS/LV PW ratio, ED  (N)       0.8            1.23       <=1.3  LV e&', lateral  6.8   cm/s     8.77       ---------  LV E/e&', lateral               24.12          23.26      ---------  LV e&', medial                  4.61  cm/s     5.26       ---------  LV E/e&', medial                35.57          38.78      ---------  LV e&',  average                 5.71  cm/s     ---------- ---------  LV E/e&', average               28.75          ---------- ---------    Ventricular septum             Value          12/28/2013 Reference  IVS thickness, ED              12.3  mm       19.5       ---------    Aortic valve                   Value          12/28/2013 Reference  Aortic valve peak              357   cm/s     323        ---------  velocity, S  Aortic valve mean              237   cm/s     233        ---------  velocity, S  Aortic valve VTI, S            74.3  cm       77.3       ---------  Aortic mean                    29    mm Hg    25         ---------  gradient, S  Aortic peak                    51    mm Hg    42         ---------  gradient, S    Aorta                          Value          12/28/2013 Reference  Aortic root ID, ED             30    mm       41         ---------    Left atrium                    Value          12/28/2013 Reference  LA ID, A-P, ES  53    mm       58         ---------  LA ID/bsa, A-P       (N)       2.11  cm/m^2   2.42       <=2.2    Mitral valve                   Value          12/28/2013 Reference  Mitral E-wave peak             164   cm/s     204        ---------  velocity  Mitral A-wave peak             99.8  cm/s     ---------- ---------  velocity  Mitral mean                    108   cm/s     ---------- ---------  velocity, D  Mitral deceleration  (N)       218   ms       278        150 - 230  time  Mitral pressure                108   ms       ---------- ---------  half-time  Mitral mean                    6     mm Hg    ---------- ---------  gradient, D  Mitral peak                    11    mm Hg    17         ---------  gradient, D  Mitral E/A ratio,              1.6            ---------- ---------  peak  Mitral valve                   0.81  cm^2/m^2 ---------- ---------  area/bsa, PHT, DP  Mitral annulus VTI,            61.4  cm        ---------- ---------  D Noninvasive Vascular Lab  Carotid Duplex Study  Patient:    Kash, Davie MR #:       [id number] Study Date: 05/28/2014 Gender:     M Age:        70 Height: Weight: BSA: Pt. Status: Room:       4N07C   ADMITTING    [name], [place], RVT  ORDERING     [place], [place], [place]  Reports also to:  ------------------------------------------------------------------- History and indications:  Indications  782.0 Disturbance of skin sensation.  History  Diagnostic evaluation. Right sided weakness, confusion.  ------------------------------------------------------------------- Study information:  Study status:  Routine.  Procedure:  The right CCA, right ECA, right ICA, right vertebral, left CCA, left ECA, left ICA, and left vertebral arteries were examined. A vascular evaluation was performed with the patient in the supine position.    Carotid duplex study.     Carotid Duplex exam including 2D imaging, color and spectral Doppler  were performed on the extracranial carotid and vertebral arteries using standard established protocols. Birthdate:  Patient birthdate: 1941-10-27.  Age:  Patient is 73 yr old.  Sex:  Gender: male.  Study date:  Study date: 05/28/2014. Study time: 12:42 PM.  Location:  Bedside.  Patient status: Inpatient.  Arterial flow:  +----------------+---------+-------+--------------+---------------+ Location        V sys    V ed   Flow analysis Comment         +----------------+---------+-------+--------------+---------------+ Right CCA -     -200 cm/s-16    --------------Mild irregular  proximal                 cm/s                 heterogeneous                                                 plaque mid CCA. +----------------+---------+-------+--------------+---------------+ Right CCA -     -113 cm/s-16     ----------------------------- distal                   cm/s                                 +----------------+---------+-------+--------------+---------------+ Right ECA       -182 cm/s-17    -----------------------------                          cm/s                                 +----------------+---------+-------+--------------+---------------+ Right ICA -     -149 cm/s-21    --------------Mild diffuse    proximal                 cm/s                 irregular                                                     plaque.         +----------------+---------+-------+--------------+---------------+ Right ICA -     -112 cm/s-24    ----------------------------- distal                   cm/s                                 +----------------+---------+-------+--------------+---------------+ Right vertebral 92 cm/s  20 cm/sAntegrade flow--------------- +----------------+---------+-------+--------------+---------------+ Left CCA -      141 cm/s 19 cm/s--------------Mild irregular  proximal                                      heterogeneous  plaque mid CCA. +----------------+---------+-------+--------------+---------------+ Left CCA -      -138 cm/s-19    ----------------------------- distal                   cm/s                                 +----------------+---------+-------+--------------+---------------+ Left ECA        -157 cm/s-16    -----------------------------                          cm/s                                 +----------------+---------+-------+--------------+---------------+ Left ICA -      120 cm/s 13 cm/s--------------Mild calcific   proximal                                      plaque.         +----------------+---------+-------+--------------+---------------+ Left ICA -      -153 cm/s-45     ----------------------------- distal                   cm/s                                 +----------------+---------+-------+--------------+---------------+ Left vertebral  70 cm/s  20 cm/sAntegrade flow--------------- +----------------+---------+-------+--------------+---------------+  ------------------------------------------------------------------- Summary:  - The vertebral arteries appear patent with antegrade flow. - Findings consistent with 1-39 percent stenosis involving the   right internal carotid artery and the left internal carotid   artery. CT HEAD WITHOUT CONTRAST   TECHNIQUE: Contiguous axial images were obtained from the base of the skull through the vertex without intravenous contrast.   COMPARISON:  05/09/2012   FINDINGS: Skull and Sinuses:Negative for fracture or destructive process.   Orbits: Bilateral cataract resection.   Brain: No evidence of acute abnormality, such as acute infarction, hemorrhage, hydrocephalus, or mass lesion/mass effect. Generalized brain atrophy, stable from 2013. Incidental choroid plexus xanthogranulomas.   IMPRESSION: No acute intracranial findings.   PORTABLE CHEST - 1 VIEW   COMPARISON:  05/18/2014.   FINDINGS: Chronic cardiopericardial enlargement. Mediastinal contours are distorted by rightward rotation. Prominent interstitial markings, likely from interstitial crowding given low lung volumes. No definitive pneumonia. No effusion or pneumothorax. Left axillary surgical clips.   IMPRESSION: 1. No evidence of pneumonia. 2. Low lung volumes, which decreases diagnostic sensitivity.     MRI HEAD WITHOUT CONTRAST   MRA HEAD WITHOUT CONTRAST   TECHNIQUE: Multiplanar, multiecho pulse sequences of the brain and surrounding structures were obtained without intravenous contrast. Angiographic images of the head were obtained using MRA technique without contrast.   COMPARISON:  CT head 05/26/2014.  MR  head 03/19/2008.   FINDINGS: MRI HEAD FINDINGS   Mild cerebral and cerebellar atrophy. Minimal white matter disease. No acute stroke, hemorrhage, mass lesion, or hydrocephalus. No extra-axial fluid. Flow voids are maintained. Partial empty sella. Bilateral cataract extraction. Extracranial soft tissues unremarkable.   Similar appearance to prior MR and CT.   MRA HEAD FINDINGS   Internal carotid arteries are widely patent. Basilar artery widely patent with vertebrals codominant. Mild non stenotic irregularity of the anterior,  middle, or posterior cerebral arteries. No cerebellar branch occlusion. No intracranial aneurysm.   IMPRESSION: Mild age-related changes as described. No acute intracranial findings.   No proximal flow reducing lesion is evident.   PORTABLE ABDOMEN - 1 VIEW   COMPARISON:  None.   FINDINGS: There are air-filled small bowel loops and colon throughout the abdomen and pelvis consistent with a diffuse ileus. No findings for obstruction or perforation.   IMPRESSION: Diffuse ileus bowel gas pattern.  ASSESSMENT/PLAN:  Right knee osteoarthritis-status post right total knee arthroplasty. Continue rehabilitation. CAD-stable Diabetes mellitus with vascular complications-continue current medications Acute blood loss anemia-check hemoglobin level Chronic kidney disease stage III-check renal functions Renovascular hypertension-blood pressure elevated. Will monitor. BPH-continue Hytrin Check CBC and BMP  I have reviewed patient's medical records received at admission/from hospitalization.  CPT CODE: 26948  Doyal Saric Y Ahmira [place], [place] [phone]

## 2014-06-02 LAB — CULTURE, BLOOD (ROUTINE X 2)
Culture: NO GROWTH
Culture: NO GROWTH

## 2014-06-21 ENCOUNTER — Ambulatory Visit: Payer: Medicare Other | Attending: Orthopedic Surgery | Admitting: Physical Therapy

## 2014-06-21 DIAGNOSIS — R262 Difficulty in walking, not elsewhere classified: Secondary | ICD-10-CM | POA: Insufficient documentation

## 2014-06-21 DIAGNOSIS — E119 Type 2 diabetes mellitus without complications: Secondary | ICD-10-CM | POA: Diagnosis not present

## 2014-06-21 DIAGNOSIS — I10 Essential (primary) hypertension: Secondary | ICD-10-CM | POA: Insufficient documentation

## 2014-06-21 DIAGNOSIS — IMO0001 Reserved for inherently not codable concepts without codable children: Secondary | ICD-10-CM | POA: Diagnosis present

## 2014-06-21 DIAGNOSIS — M25669 Stiffness of unspecified knee, not elsewhere classified: Secondary | ICD-10-CM | POA: Insufficient documentation

## 2014-06-21 DIAGNOSIS — M25569 Pain in unspecified knee: Secondary | ICD-10-CM | POA: Diagnosis not present

## 2014-06-21 DIAGNOSIS — R609 Edema, unspecified: Secondary | ICD-10-CM | POA: Diagnosis not present

## 2014-06-21 DIAGNOSIS — Z96659 Presence of unspecified artificial knee joint: Secondary | ICD-10-CM | POA: Diagnosis not present

## 2014-06-21 DIAGNOSIS — M629 Disorder of muscle, unspecified: Secondary | ICD-10-CM | POA: Insufficient documentation

## 2014-06-21 DIAGNOSIS — M2420 Disorder of ligament, unspecified site: Secondary | ICD-10-CM | POA: Insufficient documentation

## 2014-06-23 ENCOUNTER — Ambulatory Visit: Payer: Medicare Other | Attending: Orthopedic Surgery | Admitting: Physical Therapy

## 2014-06-23 DIAGNOSIS — M2420 Disorder of ligament, unspecified site: Secondary | ICD-10-CM | POA: Insufficient documentation

## 2014-06-23 DIAGNOSIS — Z96659 Presence of unspecified artificial knee joint: Secondary | ICD-10-CM | POA: Insufficient documentation

## 2014-06-23 DIAGNOSIS — R609 Edema, unspecified: Secondary | ICD-10-CM | POA: Insufficient documentation

## 2014-06-23 DIAGNOSIS — M25569 Pain in unspecified knee: Secondary | ICD-10-CM | POA: Insufficient documentation

## 2014-06-23 DIAGNOSIS — IMO0001 Reserved for inherently not codable concepts without codable children: Secondary | ICD-10-CM | POA: Diagnosis present

## 2014-06-23 DIAGNOSIS — M25669 Stiffness of unspecified knee, not elsewhere classified: Secondary | ICD-10-CM | POA: Insufficient documentation

## 2014-06-23 DIAGNOSIS — E119 Type 2 diabetes mellitus without complications: Secondary | ICD-10-CM | POA: Insufficient documentation

## 2014-06-23 DIAGNOSIS — M629 Disorder of muscle, unspecified: Secondary | ICD-10-CM | POA: Diagnosis not present

## 2014-06-23 DIAGNOSIS — R262 Difficulty in walking, not elsewhere classified: Secondary | ICD-10-CM | POA: Insufficient documentation

## 2014-06-23 DIAGNOSIS — I10 Essential (primary) hypertension: Secondary | ICD-10-CM | POA: Insufficient documentation

## 2014-06-28 ENCOUNTER — Ambulatory Visit: Payer: Medicare Other | Admitting: Physical Therapy

## 2014-06-28 DIAGNOSIS — IMO0001 Reserved for inherently not codable concepts without codable children: Secondary | ICD-10-CM | POA: Diagnosis not present

## 2014-06-30 ENCOUNTER — Ambulatory Visit: Payer: Medicare Other | Admitting: Physical Therapy

## 2014-06-30 DIAGNOSIS — IMO0001 Reserved for inherently not codable concepts without codable children: Secondary | ICD-10-CM | POA: Diagnosis not present

## 2014-07-02 ENCOUNTER — Ambulatory Visit: Payer: Medicare Other | Admitting: Physical Therapy

## 2014-07-02 DIAGNOSIS — IMO0001 Reserved for inherently not codable concepts without codable children: Secondary | ICD-10-CM | POA: Diagnosis not present

## 2014-07-05 ENCOUNTER — Ambulatory Visit: Payer: Medicare Other | Admitting: Physical Therapy

## 2014-07-05 DIAGNOSIS — IMO0001 Reserved for inherently not codable concepts without codable children: Secondary | ICD-10-CM | POA: Diagnosis not present

## 2014-07-07 ENCOUNTER — Ambulatory Visit: Payer: Medicare Other | Admitting: Physical Therapy

## 2014-07-07 DIAGNOSIS — IMO0001 Reserved for inherently not codable concepts without codable children: Secondary | ICD-10-CM | POA: Diagnosis not present

## 2014-07-09 ENCOUNTER — Ambulatory Visit: Payer: Medicare Other | Admitting: Physical Therapy

## 2014-07-12 ENCOUNTER — Ambulatory Visit: Payer: Medicare Other | Admitting: Physical Therapy

## 2014-07-14 ENCOUNTER — Ambulatory Visit: Payer: Medicare Other | Admitting: Physical Therapy

## 2014-07-14 DIAGNOSIS — IMO0001 Reserved for inherently not codable concepts without codable children: Secondary | ICD-10-CM | POA: Diagnosis not present

## 2014-07-16 ENCOUNTER — Ambulatory Visit: Payer: Medicare Other | Admitting: Physical Therapy

## 2014-07-16 DIAGNOSIS — IMO0001 Reserved for inherently not codable concepts without codable children: Secondary | ICD-10-CM | POA: Diagnosis not present

## 2014-07-18 ENCOUNTER — Other Ambulatory Visit: Payer: Self-pay | Admitting: Cardiology

## 2014-07-19 ENCOUNTER — Ambulatory Visit: Payer: Medicare Other | Admitting: Physical Therapy

## 2014-07-19 DIAGNOSIS — IMO0001 Reserved for inherently not codable concepts without codable children: Secondary | ICD-10-CM | POA: Diagnosis not present

## 2014-07-21 ENCOUNTER — Encounter: Payer: Medicare Other | Admitting: Physical Therapy

## 2014-07-23 ENCOUNTER — Encounter: Payer: Medicare Other | Admitting: Physical Therapy

## 2014-07-27 ENCOUNTER — Other Ambulatory Visit (HOSPITAL_COMMUNITY): Payer: Self-pay | Admitting: Cardiology

## 2014-09-06 ENCOUNTER — Other Ambulatory Visit (HOSPITAL_COMMUNITY): Payer: Self-pay | Admitting: Cardiology

## 2014-09-30 ENCOUNTER — Ambulatory Visit: Payer: Medicare Other | Attending: Orthopedic Surgery

## 2014-09-30 DIAGNOSIS — M6281 Muscle weakness (generalized): Secondary | ICD-10-CM | POA: Insufficient documentation

## 2014-09-30 DIAGNOSIS — X58XXXD Exposure to other specified factors, subsequent encounter: Secondary | ICD-10-CM | POA: Diagnosis not present

## 2014-09-30 DIAGNOSIS — Z96651 Presence of right artificial knee joint: Secondary | ICD-10-CM | POA: Insufficient documentation

## 2014-09-30 DIAGNOSIS — M25619 Stiffness of unspecified shoulder, not elsewhere classified: Secondary | ICD-10-CM | POA: Insufficient documentation

## 2014-09-30 DIAGNOSIS — M25519 Pain in unspecified shoulder: Secondary | ICD-10-CM | POA: Insufficient documentation

## 2014-09-30 DIAGNOSIS — Z5189 Encounter for other specified aftercare: Secondary | ICD-10-CM | POA: Diagnosis present

## 2014-09-30 DIAGNOSIS — I10 Essential (primary) hypertension: Secondary | ICD-10-CM | POA: Diagnosis not present

## 2014-09-30 DIAGNOSIS — E119 Type 2 diabetes mellitus without complications: Secondary | ICD-10-CM | POA: Insufficient documentation

## 2014-09-30 DIAGNOSIS — S43004D Unspecified dislocation of right shoulder joint, subsequent encounter: Secondary | ICD-10-CM | POA: Diagnosis not present

## 2014-10-05 ENCOUNTER — Ambulatory Visit: Payer: Medicare Other

## 2014-10-05 DIAGNOSIS — Z5189 Encounter for other specified aftercare: Secondary | ICD-10-CM | POA: Diagnosis not present

## 2014-10-06 ENCOUNTER — Other Ambulatory Visit (HOSPITAL_COMMUNITY): Payer: Self-pay | Admitting: Cardiology

## 2014-10-07 ENCOUNTER — Ambulatory Visit: Payer: Medicare Other

## 2014-10-07 DIAGNOSIS — Z5189 Encounter for other specified aftercare: Secondary | ICD-10-CM | POA: Diagnosis not present

## 2014-10-09 ENCOUNTER — Other Ambulatory Visit: Payer: Self-pay | Admitting: Cardiology

## 2014-10-14 ENCOUNTER — Ambulatory Visit: Payer: Medicare Other | Admitting: Physical Therapy

## 2014-10-14 DIAGNOSIS — Z5189 Encounter for other specified aftercare: Secondary | ICD-10-CM | POA: Diagnosis not present

## 2014-10-19 ENCOUNTER — Ambulatory Visit: Payer: Medicare Other | Admitting: Physical Therapy

## 2014-10-19 DIAGNOSIS — Z5189 Encounter for other specified aftercare: Secondary | ICD-10-CM | POA: Diagnosis not present

## 2014-10-21 ENCOUNTER — Ambulatory Visit: Payer: Medicare Other | Admitting: Physical Therapy

## 2014-10-21 DIAGNOSIS — Z5189 Encounter for other specified aftercare: Secondary | ICD-10-CM | POA: Diagnosis not present

## 2014-10-26 ENCOUNTER — Ambulatory Visit: Payer: Medicare Other | Attending: Orthopedic Surgery | Admitting: Physical Therapy

## 2014-10-26 DIAGNOSIS — Z96651 Presence of right artificial knee joint: Secondary | ICD-10-CM | POA: Insufficient documentation

## 2014-10-26 DIAGNOSIS — M6281 Muscle weakness (generalized): Secondary | ICD-10-CM | POA: Diagnosis not present

## 2014-10-26 DIAGNOSIS — I10 Essential (primary) hypertension: Secondary | ICD-10-CM | POA: Diagnosis not present

## 2014-10-26 DIAGNOSIS — E119 Type 2 diabetes mellitus without complications: Secondary | ICD-10-CM | POA: Insufficient documentation

## 2014-10-26 DIAGNOSIS — S43004D Unspecified dislocation of right shoulder joint, subsequent encounter: Secondary | ICD-10-CM | POA: Insufficient documentation

## 2014-10-26 DIAGNOSIS — Z5189 Encounter for other specified aftercare: Secondary | ICD-10-CM | POA: Diagnosis present

## 2014-10-28 ENCOUNTER — Ambulatory Visit: Payer: Medicare Other | Admitting: Physical Therapy

## 2014-10-28 DIAGNOSIS — Z5189 Encounter for other specified aftercare: Secondary | ICD-10-CM | POA: Diagnosis not present

## 2014-11-02 ENCOUNTER — Ambulatory Visit: Payer: Medicare Other

## 2014-11-02 DIAGNOSIS — Z5189 Encounter for other specified aftercare: Secondary | ICD-10-CM | POA: Diagnosis not present

## 2014-11-04 ENCOUNTER — Ambulatory Visit: Payer: Medicare Other | Admitting: Physical Therapy

## 2014-11-04 DIAGNOSIS — Z5189 Encounter for other specified aftercare: Secondary | ICD-10-CM | POA: Diagnosis not present

## 2014-11-07 ENCOUNTER — Other Ambulatory Visit: Payer: Self-pay | Admitting: Cardiology

## 2014-11-08 ENCOUNTER — Encounter: Payer: Self-pay | Admitting: Cardiology

## 2014-11-09 ENCOUNTER — Ambulatory Visit: Payer: Medicare Other | Admitting: Physical Therapy

## 2014-11-09 DIAGNOSIS — Z5189 Encounter for other specified aftercare: Secondary | ICD-10-CM | POA: Diagnosis not present

## 2014-11-11 ENCOUNTER — Ambulatory Visit: Payer: Medicare Other | Admitting: Physical Therapy

## 2014-11-11 DIAGNOSIS — Z5189 Encounter for other specified aftercare: Secondary | ICD-10-CM | POA: Diagnosis not present

## 2014-11-16 ENCOUNTER — Ambulatory Visit: Payer: Medicare Other | Admitting: Physical Therapy

## 2014-11-16 DIAGNOSIS — Z5189 Encounter for other specified aftercare: Secondary | ICD-10-CM | POA: Diagnosis not present

## 2014-11-17 ENCOUNTER — Ambulatory Visit (INDEPENDENT_AMBULATORY_CARE_PROVIDER_SITE_OTHER): Payer: Medicare Other | Admitting: Cardiology

## 2014-11-17 VITALS — BP 160/72 | HR 62 | Ht 71.0 in | Wt 256.0 lb

## 2014-11-17 DIAGNOSIS — I119 Hypertensive heart disease without heart failure: Secondary | ICD-10-CM

## 2014-11-17 DIAGNOSIS — Z952 Presence of prosthetic heart valve: Secondary | ICD-10-CM

## 2014-11-17 DIAGNOSIS — I259 Chronic ischemic heart disease, unspecified: Secondary | ICD-10-CM

## 2014-11-17 DIAGNOSIS — I4891 Unspecified atrial fibrillation: Secondary | ICD-10-CM

## 2014-11-17 DIAGNOSIS — Z954 Presence of other heart-valve replacement: Secondary | ICD-10-CM

## 2014-11-17 DIAGNOSIS — I2510 Atherosclerotic heart disease of native coronary artery without angina pectoris: Secondary | ICD-10-CM

## 2014-11-17 MED ORDER — HYDRALAZINE HCL 10 MG PO TABS: 10.0000 mg | ORAL_TABLET | Freq: Three times a day (TID) | ORAL | Status: DC

## 2014-11-17 NOTE — Patient Instructions (Signed)
START HYDRALAZINE 10 MG THREE TIMES A DAY, RX SENT TO [facility]  Your physician wants you to follow-up in: [place] will receive a reminder letter in the mail two months in advance. If you don't receive a letter, please call our office to schedule the follow-up appointment.

## 2014-11-17 NOTE — Progress Notes (Signed)
[name] Date of Birth:  02-28-1941 [address] 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  He underwent successful direct current cardioversion most recently on 12/30/2013.  The patient has known ischemic heart disease and had coronary artery bypass graft surgery in 2006. 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 Postoperatively he had atrial fibrillation and was successfully cardioverted in August of 2012. Patient has history of renal insufficiency and is followed by nephrology. He has a history of arthritis and is followed by Dr. Charlestine Night. His primary care physician is Dr. Maceo Pro. The patient has diabetes and is followed by Dr. Buddy Duty. Patient has a history of osteoarthritis and is followed by Dr. [name].  He has also seen Dr. [name] and he underwent a right total knee replacement on 05/24/2014.  His most recent echocardiogram on 05/28/14 showed normal systolic function with ejection fraction 60-65%.  There was diastolic dysfunction.  There was moderate aortic stenosis with peak gradient of 51 and mean gradient of 29.  There was no aortic insufficiency.  There was mild mitral stenosis and mild mitral regurgitation and there was mild tricuspid regurgitation. Since we last saw him he had successful colonoscopy by Dr. Oletta Lamas on January 29, 2014. The patient has been having some symptoms of anxiety and Dr. Casimiro Needle is following him.  He is also seeing a psychologist. He has been monitoring his blood pressure.  His systolic blood pressures have been running high.  He is watching his salt and is not having any peripheral edema.  Weight has been stable since last visit. Current Outpatient Prescriptions  Medication Sig Dispense Refill  . acetaminophen (TYLENOL) 500 MG  tablet Take 1,000 mg by mouth every 6 (six) hours as needed for moderate pain.    [name] allopurinol (ZYLOPRIM) 100 MG tablet Take 200 mg by mouth daily with breakfast.     . amLODipine (NORVASC) 10 MG tablet TAKE 1 TABLET BY MOUTH EVERY DAY 90 tablet 0  . aspirin EC 81 MG tablet Take 81 mg by mouth at bedtime.     . bisacodyl (DULCOLAX) 10 MG suppository Place 1 suppository (10 mg total) rectally daily as needed for moderate constipation. 12 suppository 0  . CALCIUM-MAGNESIUM-VITAMIN D ER PO Take 1 tablet by mouth 2 (two) times daily.    [name] docusate sodium 100 MG CAPS Take 100 mg by mouth 2 (two) times daily. 10 capsule 0  . HYDROmorphone (DILAUDID) 4 MG tablet Take one tablet by mouth every 4 hours as needed for pain; Take 1/2 tablet by mouth as needed if 4mg  not effective 180 tablet 0  . insulin glargine (LANTUS) 100 UNIT/ML injection Inject 1-6 Units into the skin daily with breakfast. Pt is on a sliding scale 100-120=1 unit; every 20 above 120 add a unit    . lisinopril (PRINIVIL,ZESTRIL) 40 MG tablet Take 40 mg by mouth daily with breakfast.    . Melatonin 5 MG TABS Take 15 mg by mouth at bedtime.    . metFORMIN (GLUCOPHAGE) 500 MG tablet Take 500 mg by mouth 2 (two) times daily with a meal.    . metoprolol tartrate (LOPRESSOR) 25 MG tablet TAKE 1 TABLET BY MOUTH TWICE A DAY 60 tablet 0  . morphine (MSIR)  15 MG tablet Take one tablet by mouth every 4 hours as needed for severe pain 180 tablet 0  . PRESCRIPTION MEDICATION Apply 1 drop topically at bedtime. DMSO with Lamisil    . Rivaroxaban (XARELTO) 15 MG TABS tablet Take 15 mg by mouth daily with supper.    . terazosin (HYTRIN) 10 MG capsule Take 10 mg by mouth daily with breakfast.    . testosterone cypionate (DEPOTESTOTERONE CYPIONATE) 200 MG/ML injection Inject 61ml intramuscularly as directed twice a month 10 mL 0  . traMADol (ULTRAM) 50 MG tablet Take by mouth. for pain  0  . zolpidem (AMBIEN) 10 MG tablet Take 5 mg by mouth at bedtime as  needed for sleep.    . hydrALAZINE (APRESOLINE) 10 MG tablet Take 1 tablet (10 mg total) by mouth 3 (three) times daily. 90 tablet 11   No current facility-administered medications for this visit.    Allergies  Allergen Reactions  . Actos [Pioglitazone Hydrochloride] Swelling  . Penicillins Other (See Comments)    Serum sickness, swelling  . Amiodarone Other (See Comments)    diarrhea  . Latex Swelling  . Codeine     Makes Pt aggressive  . Depakote [Divalproex Sodium] Diarrhea    severe  . Loop Diuretics     Spike in BUN levels  . Losartan Other (See Comments)  . Nsaids     Renal problems  . Orudis [Ketoprofen] Other (See Comments)    Cannot take due to renal insufficiency  . Pentazocine Lactate   . Poison Ivy Extract [employer]ison [employer]]   . Rozerem [Ramelteon] Diarrhea    Severe   . Sertraline Hcl     Pt not sure  . Benazepril Hcl     ? Possible lowers plateletes  . Indomethacin Other (See Comments)    Renal problems   . Minocycline Other (See Comments)    Makes dizzy and feel just lousy.  . Potassium Iodide Rash    Itching   . Shellfish Allergy Rash    Patient Active Problem List   Diagnosis Date Noted  . Atrial fibrillation with controlled ventricular response 07/05/2011    Priority: High  . BPH (benign prostatic hyperplasia) 04/26/2011    Priority: High  . AORTIC STENOSIS 11/24/2010    Priority: High  . Hx of CABG 04/26/2011    Priority: Medium  . Diabetes 04/26/2011    Priority: Medium  . Benign hypertensive heart disease without heart failure 04/26/2011    Priority: Medium  . Type I (juvenile type) diabetes mellitus with peripheral circulatory disorders, not stated as uncontrolled(250.71) 06/01/2014  . Acute posthemorrhagic anemia 06/01/2014  . UTI (urinary tract infection) 05/31/2014  . Anxiety 05/31/2014  . Insomnia 05/31/2014  . Weakness 05/26/2014  . Fever 05/26/2014  . OA (osteoarthritis) of knee 05/24/2014  . Cataract 09/02/2013  .  Osteoarthritis 09/02/2013  . Atrial flutter 08/20/2012  . Back pain 08/20/2012  . Severe tricuspid regurgitation by prior echocardiogram 08/14/2011  . S/P AVR (aortic valve replacement), Tricuspid Valve repair 08/14/2011  . Chronic kidney disease (CKD), stage III (moderate)   . Gout 04/26/2011  . MYOCARDIAL INFARCTION 11/27/2010  . CORONARY HEART DISEASE 11/27/2010  . VALVULAR HEART DISEASE 11/27/2010  . Sarcoidosis 11/24/2010  . OBSTRUCTIVE SLEEP APNEA 11/24/2010  . DYSPNEA 11/24/2010    History  Smoking status  . Former Smoker -- 28 years  . Types: Pipe, Cigars  . Quit date: 12/25/1995  Smokeless tobacco  . Never Used  History  Alcohol Use  . Yes    Comment: 3-5 beer or wine per week    Family History  Problem Relation Age of Onset  . Heart attack Father   . Allergies    . Asthma    . Heart disease    . Cancer      Review of Systems: Constitutional: no fever chills diaphoresis or fatigue or change in weight.  Head and neck: no hearing loss, no epistaxis, no photophobia or visual disturbance. Respiratory: No cough, shortness of breath or wheezing. Cardiovascular: No chest pain peripheral edema, palpitations. Gastrointestinal: No abdominal distention, no abdominal pain, no change in bowel habits hematochezia or melena. Genitourinary: No dysuria, no frequency, no urgency, no nocturia. Musculoskeletal:No arthralgias, no back pain, no gait disturbance or myalgias. Neurological: No dizziness, no headaches, no numbness, no seizures, no syncope, no weakness, no tremors. Hematologic: No lymphadenopathy, no easy bruising. Psychiatric: No confusion, no hallucinations, no sleep disturbance.    Physical Exam: Filed Vitals:   11/17/14 1612  BP: 160/72  Pulse: 62   the general appearance reveals a large gentleman in no distress his weight is unchanged since last visit.  He is still significantly obese.The head and neck exam reveals pupils equal and reactive.   Extraocular movements are full.  There is no scleral icterus.  The mouth and pharynx are normal.  The neck is supple.  The carotids reveal no bruits.  The jugular venous pressure is normal.  The  thyroid is not enlarged.  There is no lymphadenopathy.  The chest is clear to percussion and auscultation.  There are no rales or rhonchi.  Expansion of the chest is symmetrical.  The precordium is quiet.  Pulse is irregular  The first heart sound is normal.  The second heart sound is physiologically split.  There is systolic flow murmur across his prosthetic aortic valve.  A diastolic murmur.l lift or heave.  The abdomen is soft and nontender.  The bowel sounds are normal.  The liver and spleen are not enlarged.  There are no abdominal masses.  There are no abdominal bruits.  Extremities reveal good pedal pulses.  There is 2+ pretibial edema. There is no cyanosis or clubbing.  Strength is normal and symmetrical in all extremities.  There is no lateralizing weakness.  There are no sensory deficits.  The skin is warm and dry.  There is no rash.   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  Assessment / Plan: 1.  Ischemic heart disease status post CABG in 2006. 2.  History of severe aortic stenosis, with successful prosthetic aortic valve replacement in 2012 3.  Past history of atrial fibrillation/flutter, holding normal sinus rhythm 4.  Status post tricuspid valve repair 2012 5.  Diabetes mellitus,type 2 6.  Chronic kidney disease followed by Dr.Coladanato 7.  Essential hypertension  Disposition: Continue current medications.  Start hydralazine 10 mg 3 times a day for better control of his systolic blood pressure. He will follow-up regarding his blood his PCP.  We will plan to see him back here in 6 months for office visit and EKG.

## 2014-11-17 NOTE — Assessment & Plan Note (Signed)
The patient appears to be holding normal sinus rhythm since his last cardioversion.  His P waves are difficult to see.  We had him walk up and down the hall to be sure that he was not in a slow atrial flutter and did not see any evidence of flutter.

## 2014-11-17 NOTE — Assessment & Plan Note (Signed)
The patient has not been experiencing any chest pain or recurrent angina pectoris.  Overall his exercise tolerance is improved.  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

## 2014-11-17 NOTE — Assessment & Plan Note (Signed)
His systolic blood pressure continues to run high in the 160 range.  His pulse is too slow to add further beta blocker.  We will add a new drug, hydralazine 10 mg 3 times a day.

## 2014-11-30 ENCOUNTER — Other Ambulatory Visit: Payer: Self-pay | Admitting: Cardiology

## 2014-12-07 ENCOUNTER — Telehealth: Payer: Self-pay | Admitting: Cardiology

## 2014-12-07 NOTE — Telephone Encounter (Signed)
Okay to stop hydralazine.  To help systolic blood pressure continue amlodipine 10 mg in the morning and add amlodipine 5 mg in the evening.

## 2014-12-07 NOTE — Telephone Encounter (Signed)
New Message         Pt calling stating he needs to speak to [place]. Pt would not state what it was in regards to. Please call back and advise.

## 2014-12-07 NOTE — Telephone Encounter (Signed)
Since starting the Hydralazine he has been having diarrhea Heart rate still brady at times Systolic blood pressure still running in the 150's Stated he has not felt well since starting  Patient stated he was stopping Will forward to  Dr. Mare Ferrari for recommendations

## 2014-12-08 NOTE — Telephone Encounter (Signed)
Spoke with patient and states his heart rate still low Scheduled ov for tomorrow with  Dr. Mare Ferrari

## 2014-12-09 ENCOUNTER — Ambulatory Visit (INDEPENDENT_AMBULATORY_CARE_PROVIDER_SITE_OTHER): Payer: Medicare Other | Admitting: Cardiology

## 2014-12-09 ENCOUNTER — Encounter: Payer: Self-pay | Admitting: Cardiology

## 2014-12-09 VITALS — BP 158/66 | HR 48 | Ht 71.0 in | Wt 253.8 lb

## 2014-12-09 DIAGNOSIS — N183 Chronic kidney disease, stage 3 unspecified: Secondary | ICD-10-CM

## 2014-12-09 DIAGNOSIS — Z952 Presence of prosthetic heart valve: Secondary | ICD-10-CM

## 2014-12-09 DIAGNOSIS — I2510 Atherosclerotic heart disease of native coronary artery without angina pectoris: Secondary | ICD-10-CM

## 2014-12-09 DIAGNOSIS — Z954 Presence of other heart-valve replacement: Secondary | ICD-10-CM

## 2014-12-09 DIAGNOSIS — I480 Paroxysmal atrial fibrillation: Secondary | ICD-10-CM

## 2014-12-09 DIAGNOSIS — I119 Hypertensive heart disease without heart failure: Secondary | ICD-10-CM

## 2014-12-09 DIAGNOSIS — I350 Nonrheumatic aortic (valve) stenosis: Secondary | ICD-10-CM

## 2014-12-09 DIAGNOSIS — I4891 Unspecified atrial fibrillation: Secondary | ICD-10-CM

## 2014-12-09 MED ORDER — AMLODIPINE BESYLATE 5 MG PO TABS: 5.0000 mg | ORAL_TABLET | Freq: Every evening | ORAL | Status: DC

## 2014-12-09 NOTE — Progress Notes (Signed)
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  He has had recent difficulties with persistent elevation of his systolic blood pressure.  He had a recent trial of hydralazine which he did not tolerate.  .  It caused diarrhea.  He underwent successful direct current cardioversion most recently on 12/30/2013.  The patient has known ischemic heart disease and had coronary artery bypass graft surgery in 2006. 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 Postoperatively he had atrial fibrillation and was successfully cardioverted in August of 2012. Patient has history of renal insufficiency and is followed by nephrology. He has a history of arthritis and is followed by Dr. Charlestine Night. His primary care physician is Dr. Maceo Pro. The patient has diabetes and is followed by Dr. Buddy Duty. Patient has a history of osteoarthritis and is followed by Dr. [name].  He has also seen Dr. [name] and he underwent a right total knee replacement on 05/24/2014.  His most recent echocardiogram on 05/28/14 showed normal systolic function with ejection fraction 60-65%.  There was diastolic dysfunction.  There was moderate aortic stenosis with peak gradient of 51 and mean gradient of 29.  There was no aortic insufficiency.  There was mild mitral stenosis and mild mitral regurgitation and there was mild tricuspid regurgitation. Since we last saw him he had successful colonoscopy by Dr. Oletta Lamas on January 29, 2014. The patient has been having some symptoms of anxiety and Dr. Casimiro Needle is following him.  He is also seeing a psychologist. He has been monitoring his blood pressure.  His systolic blood pressures have been running high.  He is watching his salt  and is not having any peripheral edema.  Weight has been stable since last visit. Current Outpatient Prescriptions  Medication Sig Dispense Refill  . acetaminophen (TYLENOL) 500 MG tablet Take 1,000 mg by mouth every 6 (six) hours as needed for moderate pain.    [name] allopurinol (ZYLOPRIM) 100 MG tablet Take 200 mg by mouth daily with breakfast.     . amLODipine (NORVASC) 10 MG tablet TAKE 1 TABLET BY MOUTH EVERY DAY 90 tablet 0  . aspirin EC 81 MG tablet Take 81 mg by mouth at bedtime.     . bisacodyl (DULCOLAX) 10 MG suppository Place 1 suppository (10 mg total) rectally daily as needed for moderate constipation. 12 suppository 0  . CALCIUM-MAGNESIUM-VITAMIN D ER PO Take 1 tablet by mouth 2 (two) times daily.    [name] docusate sodium 100 MG CAPS Take 100 mg by mouth 2 (two) times daily. 10 capsule 0  . HYDROmorphone (DILAUDID) 4 MG tablet Take one tablet by mouth every 4 hours as needed for pain; Take 1/2 tablet by mouth as needed if 4mg  not effective 180 tablet 0  . insulin glargine (LANTUS) 100 UNIT/ML injection Inject 1-6 Units into the skin daily with breakfast. Pt is on a sliding scale 100-120=1 unit; every 20 above 120 add a unit    . lisinopril (PRINIVIL,ZESTRIL) 40 MG tablet Take 40 mg by mouth daily with breakfast.    . Melatonin 5 MG TABS Take 15 mg by mouth at bedtime.    . metFORMIN (GLUCOPHAGE) 500 MG tablet Take  500 mg by mouth 2 (two) times daily with a meal.    . metoprolol tartrate (LOPRESSOR) 25 MG tablet TAKE 1 TABLET BY MOUTH TWICE A DAY 60 tablet 5  . PRESCRIPTION MEDICATION Apply 1 drop topically at bedtime. DMSO with Lamisil    . Rivaroxaban (XARELTO) 15 MG TABS tablet Take 15 mg by mouth daily with supper.    . terazosin (HYTRIN) 10 MG capsule Take 10 mg by mouth daily with breakfast.    . testosterone cypionate (DEPOTESTOTERONE CYPIONATE) 200 MG/ML injection Inject 79ml intramuscularly as directed twice a month 10 mL 0  . traMADol (ULTRAM) 50 MG tablet Take by mouth. for pain   0  . zolpidem (AMBIEN) 10 MG tablet Take 5 mg by mouth at bedtime as needed for sleep.    [name] amLODipine (NORVASC) 5 MG tablet Take 1 tablet (5 mg total) by mouth every evening. 90 tablet 3  . hydrALAZINE (APRESOLINE) 10 MG tablet Take 1 tablet (10 mg total) by mouth 3 (three) times daily. (Patient not taking: Reported on 12/09/2014) 90 tablet 11  . metoprolol tartrate (LOPRESSOR) 25 MG tablet TAKE 1 TABLET BY MOUTH TWICE A DAY (Patient not taking: Reported on 12/09/2014) 60 tablet 0  . morphine (MSIR) 15 MG tablet Take one tablet by mouth every 4 hours as needed for severe pain (Patient not taking: Reported on 12/09/2014) 180 tablet 0   No current facility-administered medications for this visit.    Allergies  Allergen Reactions  . Actos [Pioglitazone Hydrochloride] Swelling  . Hydralazine Hcl Diarrhea    Bradycardia, SOB   . Penicillins Other (See Comments)    Serum sickness, swelling  . Amiodarone Other (See Comments)    diarrhea  . Latex Swelling  . Codeine     Makes Pt aggressive  . Depakote [Divalproex Sodium] Diarrhea    severe  . Loop Diuretics     Spike in BUN levels  . Losartan Other (See Comments)  . Nsaids     Renal problems  . Orudis [Ketoprofen] Other (See Comments)    Cannot take due to renal insufficiency  . Pentazocine Lactate   . Poison Ivy Extract [employer]]   . Rozerem [Ramelteon] Diarrhea    Severe   . Sertraline Hcl     Pt not sure  . Benazepril Hcl     ? Possible lowers plateletes  . Indomethacin Other (See Comments)    Renal problems   . Minocycline Other (See Comments)    Makes dizzy and feel just lousy.  . Potassium Iodide Rash    Itching   . Shellfish Allergy Rash    Patient Active Problem List   Diagnosis Date Noted  . Atrial fibrillation with controlled ventricular response 07/05/2011    Priority: High  . BPH (benign prostatic hyperplasia) 04/26/2011    Priority: High  . Aortic valve disorder 11/24/2010    Priority:  High  . Hx of CABG 04/26/2011    Priority: Medium  . Diabetes 04/26/2011    Priority: Medium  . Benign hypertensive heart disease without heart failure 04/26/2011    Priority: Medium  . Type I (juvenile type) diabetes mellitus with peripheral circulatory disorders, not stated as uncontrolled(250.71) 06/01/2014  . Acute posthemorrhagic anemia 06/01/2014  . UTI (urinary tract infection) 05/31/2014  . Anxiety 05/31/2014  . Insomnia 05/31/2014  . Weakness 05/26/2014  . Fever 05/26/2014  . OA (osteoarthritis) of knee 05/24/2014  . Cataract 09/02/2013  . Osteoarthritis 09/02/2013  . Atrial  flutter 08/20/2012  . Back pain 08/20/2012  . Severe tricuspid regurgitation by prior echocardiogram 08/14/2011  . S/P AVR (aortic valve replacement), Tricuspid Valve repair 08/14/2011  . Chronic kidney disease (CKD), stage III (moderate)   . Gout 04/26/2011  . MYOCARDIAL INFARCTION 11/27/2010  . Ischemic heart disease 11/27/2010  . VALVULAR HEART DISEASE 11/27/2010  . Sarcoidosis 11/24/2010  . OBSTRUCTIVE SLEEP APNEA 11/24/2010  . DYSPNEA 11/24/2010    History  Smoking status  . Former Smoker -- 28 years  . Types: Pipe, Cigars  . Quit date: 12/25/1995  Smokeless tobacco  . Never Used    History  Alcohol Use  . Yes    Comment: 3-5 beer or wine per week    Family History  Problem Relation Age of Onset  . Heart attack Father   . Allergies    . Asthma    . Heart disease    . Cancer      Review of Systems: Constitutional: no fever chills diaphoresis or fatigue or change in weight.  Head and neck: no hearing loss, no epistaxis, no photophobia or visual disturbance. Respiratory: No cough, shortness of breath or wheezing. Cardiovascular: No chest pain peripheral edema, palpitations. Gastrointestinal: No abdominal distention, no abdominal pain, no change in bowel habits hematochezia or melena. Genitourinary: No dysuria, no frequency, no urgency, no nocturia. Musculoskeletal:No  arthralgias, no back pain, no gait disturbance or myalgias. Neurological: No dizziness, no headaches, no numbness, no seizures, no syncope, no weakness, no tremors. Hematologic: No lymphadenopathy, no easy bruising. Psychiatric: No confusion, no hallucinations, no sleep disturbance.    Physical Exam: Filed Vitals:   12/09/14 1208  BP: 158/66  Pulse: 48   the general appearance reveals a large gentleman in no distress his weight is unchanged since last visit.  He is still significantly obese.The head and neck exam reveals pupils equal and reactive.  Extraocular movements are full.  There is no scleral icterus.  The mouth and pharynx are normal.  The neck is supple.  The carotids reveal no bruits.  The jugular venous pressure is normal.  The  thyroid is not enlarged.  There is no lymphadenopathy.  The chest is clear to percussion and auscultation.  There are no rales or rhonchi.  Expansion of the chest is symmetrical.  The precordium is quiet.  Pulse is irregular  The first heart sound is normal.  The second heart sound is physiologically split.  There is grade 3/6 systolic flow murmur across his prosthetic aortic valve.  Questionable soft diastolic murmur  The abdomen is soft and nontender.  The bowel sounds are normal.  The liver and spleen are not enlarged.  There are no abdominal masses.  There are no abdominal bruits.  Extremities reveal good pedal pulses.  There is only trace pretibial edema. There is no cyanosis or clubbing.  Strength is normal and symmetrical in all extremities.  There is no lateralizing weakness.  There are no sensory deficits.  The skin is warm and dry.  There is no rash.   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  Assessment / Plan: 1.  Ischemic heart disease status post CABG in 2006. 2.  History of severe aortic stenosis, with successful prosthetic aortic valve replacement in 2012 3.  Past history of  atrial fibrillation/flutter, holding normal sinus rhythm 4.  Status post tricuspid valve repair 2012 5.  Diabetes mellitus,type 2 6.  Chronic kidney disease [facility] by [facility]  7.  Essential hypertension  Disposition: Continue same medication except increase amlodipine by adding an additional 5 mg in the evening.  We will also update his 2-D echo to be sure that his aortic stenosis is no worse

## 2014-12-09 NOTE — Assessment & Plan Note (Signed)
His blood pressure at home in the morning and at night has been in the 352 systolic range.  In mid day it is often lower.  We are going to try increasing his amlodipine up to 10 mg in the morning and 5 mg in the evening.  He did not tolerate hydralazine.

## 2014-12-09 NOTE — Assessment & Plan Note (Signed)
The patient is remaining in normal sinus rhythm

## 2014-12-09 NOTE — Patient Instructions (Signed)
INCREASE YOUR AMLODIPINE TO 10 MG IN THE MORNING AND 5 MG IN THE EVENING   Your physician has requested that you have an echocardiogram. Echocardiography is a painless test that uses sound waves to create images of your heart. It provides your doctor with information about the size and shape of your heart and how well your heart's chambers and valves are working. This procedure takes approximately one hour. There are no restrictions for this procedure.   Your physician recommends that you schedule a follow-up appointment in: 3-4 WEEKS

## 2014-12-09 NOTE — Assessment & Plan Note (Signed)
The patient has a history of prior aortic valve replacement with a tissue valve for severe aortic stenosis.  His last echocardiogram was 6 months ago on 05/28/14 and showed normal ejection fraction of 60-65% with a mean gradient of 29 and a peak gradient of 51.

## 2014-12-13 ENCOUNTER — Ambulatory Visit (HOSPITAL_COMMUNITY): Payer: Medicare Other | Attending: Family Medicine | Admitting: Cardiology

## 2014-12-13 DIAGNOSIS — I10 Essential (primary) hypertension: Secondary | ICD-10-CM | POA: Insufficient documentation

## 2014-12-13 DIAGNOSIS — I080 Rheumatic disorders of both mitral and aortic valves: Secondary | ICD-10-CM | POA: Insufficient documentation

## 2014-12-13 DIAGNOSIS — E119 Type 2 diabetes mellitus without complications: Secondary | ICD-10-CM | POA: Insufficient documentation

## 2014-12-13 DIAGNOSIS — I252 Old myocardial infarction: Secondary | ICD-10-CM | POA: Diagnosis not present

## 2014-12-13 DIAGNOSIS — Z87891 Personal history of nicotine dependence: Secondary | ICD-10-CM | POA: Diagnosis not present

## 2014-12-13 DIAGNOSIS — I2510 Atherosclerotic heart disease of native coronary artery without angina pectoris: Secondary | ICD-10-CM | POA: Diagnosis not present

## 2014-12-13 DIAGNOSIS — Z954 Presence of other heart-valve replacement: Secondary | ICD-10-CM

## 2014-12-13 DIAGNOSIS — E785 Hyperlipidemia, unspecified: Secondary | ICD-10-CM | POA: Insufficient documentation

## 2014-12-13 DIAGNOSIS — I350 Nonrheumatic aortic (valve) stenosis: Secondary | ICD-10-CM

## 2014-12-13 DIAGNOSIS — Z09 Encounter for follow-up examination after completed treatment for conditions other than malignant neoplasm: Secondary | ICD-10-CM | POA: Insufficient documentation

## 2014-12-13 DIAGNOSIS — E669 Obesity, unspecified: Secondary | ICD-10-CM | POA: Diagnosis not present

## 2014-12-13 DIAGNOSIS — Z952 Presence of prosthetic heart valve: Secondary | ICD-10-CM

## 2014-12-13 NOTE — Progress Notes (Signed)
Echo performed. 

## 2014-12-30 ENCOUNTER — Encounter: Payer: Self-pay | Admitting: Cardiology

## 2014-12-30 ENCOUNTER — Ambulatory Visit (INDEPENDENT_AMBULATORY_CARE_PROVIDER_SITE_OTHER): Payer: Medicare Other | Admitting: Cardiology

## 2014-12-30 VITALS — BP 136/48 | HR 44 | Ht 71.0 in | Wt 257.8 lb

## 2014-12-30 DIAGNOSIS — I480 Paroxysmal atrial fibrillation: Secondary | ICD-10-CM

## 2014-12-30 DIAGNOSIS — I259 Chronic ischemic heart disease, unspecified: Secondary | ICD-10-CM

## 2014-12-30 DIAGNOSIS — Z954 Presence of other heart-valve replacement: Secondary | ICD-10-CM

## 2014-12-30 DIAGNOSIS — Z952 Presence of prosthetic heart valve: Secondary | ICD-10-CM

## 2014-12-30 DIAGNOSIS — I119 Hypertensive heart disease without heart failure: Secondary | ICD-10-CM

## 2014-12-30 MED ORDER — DIAZEPAM 10 MG PO TABS: 10.0000 mg | ORAL_TABLET | Freq: Every day | ORAL | Status: DC

## 2014-12-30 NOTE — Progress Notes (Signed)
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  He has had recent difficulties with persistent elevation of his systolic blood pressure.  He had a recent trial of hydralazine which he did not tolerate.  .  It caused diarrhea.  He underwent successful direct current cardioversion most recently on 12/30/2013.  The patient has known ischemic heart disease and had coronary artery bypass graft surgery in 2006. 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 Postoperatively he had atrial fibrillation and was successfully cardioverted in August of 2012. Patient has history of renal insufficiency and is followed by nephrology. He has a history of arthritis and is followed by Dr. Charlestine Night. His primary care physician is Dr. Maceo Pro. The patient has diabetes and is followed by Dr. Buddy Duty. Patient has a history of osteoarthritis and is followed by Dr. [name].  He has also seen Dr. [name] and he underwent a right total knee replacement on 05/24/2014.  His most recent echocardiogram on 05/28/14 showed normal systolic function with ejection fraction 60-65%.  There was diastolic dysfunction.  There was moderate aortic stenosis with peak gradient of 51 and mean gradient of 29.  There was no aortic insufficiency.  There was mild mitral stenosis and mild mitral regurgitation and there was mild tricuspid regurgitation. We repeated his echocardiogram on 12/13/14.  His ejection fraction was 55-60%.  There was diastolic dysfunction.  The mean gradient across the aortic valve in systole was 29 which previously had been 36.  There was moderate left atrial enlargement. The patient  had successful colonoscopy by Dr. Oletta Lamas on January 29, 2014. The patient has been having some symptoms of anxiety and Dr. Casimiro Needle is following him.  He is also seeing a psychologist. He has been monitoring his blood pressure.  His systolic blood pressures have been running high.  He is watching his salt and is not having any peripheral edema.  Weight has been stable since last visit. Current Outpatient Prescriptions  Medication Sig Dispense Refill  . acetaminophen (TYLENOL) 500 MG tablet Take 1,000 mg by mouth every 6 (six) hours as needed for moderate pain.    [name] allopurinol (ZYLOPRIM) 100 MG tablet Take 200 mg by mouth daily with breakfast.     . amLODipine (NORVASC) 10 MG tablet TAKE 1 TABLET BY MOUTH EVERY DAY 90 tablet 0  . aspirin EC 81 MG tablet Take 81 mg by mouth at bedtime.     . bisacodyl (DULCOLAX) 10 MG suppository Place 1 suppository (10 mg total) rectally daily as needed for moderate constipation. 12 suppository 0  . CALCIUM-MAGNESIUM-VITAMIN D ER PO Take 1 tablet by mouth daily.     [name] docusate sodium 100 MG CAPS Take 100 mg by mouth 2 (two) times daily. 10 capsule 0  . HYDROmorphone (DILAUDID) 4 MG tablet Take one tablet by mouth every 4 hours as needed for pain; Take 1/2 tablet by mouth as needed if 4mg  not effective 180 tablet 0  . insulin glargine (LANTUS) 100 UNIT/ML injection Inject 1-6 Units into the skin daily with breakfast. Pt is on a sliding scale 100-120=1 unit; every 20 above 120 add a unit    . lisinopril (PRINIVIL,ZESTRIL)  40 MG tablet Take 40 mg by mouth daily with breakfast.    . Melatonin 5 MG TABS Take 15 mg by mouth at bedtime.    . metFORMIN (GLUCOPHAGE) 500 MG tablet Take 500 mg by mouth 2 (two) times daily with a meal.    . metoprolol tartrate (LOPRESSOR) 25 MG tablet TAKE 1 TABLET BY MOUTH TWICE A DAY 60 tablet 0  . PRESCRIPTION MEDICATION Apply 1 drop topically at bedtime. DMSO with Lamisil    . Rivaroxaban (XARELTO) 15 MG TABS tablet Take 15 mg by mouth daily with supper.    . terazosin (HYTRIN) 10 MG capsule  Take 10 mg by mouth daily with breakfast.    . testosterone cypionate (DEPOTESTOTERONE CYPIONATE) 200 MG/ML injection Inject 53ml intramuscularly as directed twice a month 10 mL 0  . traMADol (ULTRAM) 50 MG tablet Take by mouth. for pain  0  . zolpidem (AMBIEN) 10 MG tablet Take 10 mg by mouth at bedtime as needed for sleep.      No current facility-administered medications for this visit.    Allergies  Allergen Reactions  . Actos [Pioglitazone Hydrochloride] Swelling  . Hydralazine Hcl Diarrhea    Bradycardia, SOB   . Penicillins Other (See Comments)    Serum sickness, swelling  . Amiodarone Other (See Comments)    diarrhea  . Latex Swelling  . Codeine     Makes Pt aggressive  . Depakote [Divalproex Sodium] Diarrhea    severe  . Hydralazine     Diarrhea and weakness  . Loop Diuretics     Spike in BUN levels  . Losartan Other (See Comments)  . Nsaids     Renal problems  . Orudis [Ketoprofen] Other (See Comments)    Cannot take due to renal insufficiency  . Pentazocine Lactate   . Poison Ivy Extract [employer] Poison Ivy]   . Rozerem [Ramelteon] Diarrhea    Severe   . Sertraline Hcl     Pt not sure  . Benazepril Hcl     ? Possible lowers plateletes  . Indomethacin Other (See Comments)    Renal problems   . Minocycline Other (See Comments)    Makes dizzy and feel just lousy.  . Potassium Iodide Rash    Itching   . Shellfish Allergy Rash    Patient Active Problem List   Diagnosis Date Noted  . Atrial fibrillation with controlled ventricular response 07/05/2011    Priority: High  . BPH (benign prostatic hyperplasia) 04/26/2011    Priority: High  . Aortic valve disorder 11/24/2010    Priority: High  . Hx of CABG 04/26/2011    Priority: Medium  . Diabetes 04/26/2011    Priority: Medium  . Benign hypertensive heart disease without heart failure 04/26/2011    Priority: Medium  . H/O aortic valve replacement with tissue graft 12/09/2014  . Type I (juvenile  type) diabetes mellitus with peripheral circulatory disorders, not stated as uncontrolled(250.71) 06/01/2014  . Acute posthemorrhagic anemia 06/01/2014  . UTI (urinary tract infection) 05/31/2014  . Anxiety 05/31/2014  . Insomnia 05/31/2014  . Weakness 05/26/2014  . Fever 05/26/2014  . OA (osteoarthritis) of knee 05/24/2014  . Cataract 09/02/2013  . Osteoarthritis 09/02/2013  . Atrial flutter 08/20/2012  . Back pain 08/20/2012  . Severe tricuspid regurgitation by prior echocardiogram 08/14/2011  . S/P AVR (aortic valve replacement), Tricuspid Valve repair 08/14/2011  . Chronic kidney disease (CKD), stage III (moderate)   . Gout 04/26/2011  . MYOCARDIAL INFARCTION  11/27/2010  . Ischemic heart disease 11/27/2010  . VALVULAR HEART DISEASE 11/27/2010  . Sarcoidosis 11/24/2010  . OBSTRUCTIVE SLEEP APNEA 11/24/2010  . DYSPNEA 11/24/2010    History  Smoking status  . Former Smoker -- 28 years  . Types: Pipe, Cigars  . Quit date: 12/25/1995  Smokeless tobacco  . Never Used    History  Alcohol Use  . Yes    Comment: 3-5 beer or wine per week    Family History  Problem Relation Age of Onset  . Heart attack Father   . Allergies    . Asthma    . Heart disease    . Cancer      Review of Systems: Constitutional: no fever chills diaphoresis or fatigue or change in weight.  Head and neck: no hearing loss, no epistaxis, no photophobia or visual disturbance. Respiratory: No cough, shortness of breath or wheezing. Cardiovascular: No chest pain peripheral edema, palpitations. Gastrointestinal: No abdominal distention, no abdominal pain, no change in bowel habits hematochezia or melena. Genitourinary: No dysuria, no frequency, no urgency, no nocturia. Musculoskeletal:No arthralgias, no back pain, no gait disturbance or myalgias. Neurological: No dizziness, no headaches, no numbness, no seizures, no syncope, no weakness, no tremors. Hematologic: No lymphadenopathy, no easy  bruising. Psychiatric: No confusion, no hallucinations, no sleep disturbance.    Physical Exam: Filed Vitals:   12/30/14 1216  BP: 136/48  Pulse: 44   the general appearance reveals a large gentleman in no distress his weight is unchanged since last visit.  He is still significantly obese.The head and neck exam reveals pupils equal and reactive.  Extraocular movements are full.  There is no scleral icterus.  The mouth and pharynx are normal.  The neck is supple.  The carotids reveal no bruits.  The jugular venous pressure is normal.  The  thyroid is not enlarged.  There is no lymphadenopathy.  The chest is clear to percussion and auscultation.  There are no rales or rhonchi.  Expansion of the chest is symmetrical.  The precordium is quiet.  Pulse is irregular  The first heart sound is normal.  The second heart sound is physiologically split.  There is grade 3/6 systolic flow murmur across his prosthetic aortic valve.  Questionable soft diastolic murmur  The abdomen is soft and nontender.  The bowel sounds are normal.  The liver and spleen are not enlarged.  There are no abdominal masses.  There are no abdominal bruits.  Extremities reveal good pedal pulses.  There is only trace pretibial edema. There is no cyanosis or clubbing.  Strength is normal and symmetrical in all extremities.  There is no lateralizing weakness.  There are no sensory deficits.  The skin is warm and dry.  There is no rash.   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  Assessment / Plan: 1.  Ischemic heart disease status post CABG in 2006.  Ejection fraction 55-60% by echocardiogram 12/13/14  2.  History of severe aortic stenosis, with successful prosthetic aortic valve replacement in 2012.  Most recent echocardiogram 12/13/14 shows mean gradient of 29 across the prosthetic aortic valve. 3.  Past history of atrial fibrillation/flutter, holding normal sinus  rhythm 4.  Status post tricuspid valve repair 2012 5.  Diabetes mellitus,type 2 6.  Chronic kidney disease followed by Dr.Coladanato 7.  Essential hypertension  Disposition: The patient did not tolerate the addition of 5 mg additional amlodipine.  When we went  to the total of 15 mg he started to have more shortness of breath and or peripheral edema.  Therefore we are cutting back to just 10 mg amlodipine daily.  He has noted recently when he was taking Valium as a muscle relaxant for a subluxed right shoulder that his blood pressure was significantly improved.  We will try to give him a regular dose of 10 mg of Valium each night to control his blood pressure.  Because of his bradycardia we cannot push his beta blockers any higher. Recheck in 2 months for follow-up office visit and EKG

## 2014-12-30 NOTE — Patient Instructions (Signed)
DECREASE YOU AMLODIPINE TO 10 MG DAILY  TAKE VALIUM 10 MG AT BEDTIME FOR BLOOD PRESSURE, RX SENT TO [facility]  Your physician recommends that you schedule a follow-up appointment in: 2 MONTH OV/EKG

## 2014-12-31 ENCOUNTER — Other Ambulatory Visit: Payer: Self-pay

## 2014-12-31 ENCOUNTER — Other Ambulatory Visit: Payer: Self-pay | Admitting: Cardiology

## 2015-01-13 ENCOUNTER — Encounter: Payer: Self-pay | Admitting: *Deleted

## 2015-01-13 NOTE — Telephone Encounter (Signed)
This encounter was created in error - please disregard.

## 2015-01-24 ENCOUNTER — Other Ambulatory Visit: Payer: Self-pay | Admitting: Cardiology

## 2015-02-21 ENCOUNTER — Other Ambulatory Visit: Payer: Self-pay | Admitting: Cardiology

## 2015-03-01 ENCOUNTER — Encounter: Payer: Self-pay | Admitting: Cardiology

## 2015-03-01 ENCOUNTER — Ambulatory Visit (INDEPENDENT_AMBULATORY_CARE_PROVIDER_SITE_OTHER): Payer: Medicare Other | Admitting: Cardiology

## 2015-03-01 VITALS — BP 160/58 | HR 46 | Ht 71.0 in | Wt 265.6 lb

## 2015-03-01 DIAGNOSIS — I480 Paroxysmal atrial fibrillation: Secondary | ICD-10-CM | POA: Diagnosis not present

## 2015-03-01 DIAGNOSIS — Z952 Presence of prosthetic heart valve: Secondary | ICD-10-CM

## 2015-03-01 DIAGNOSIS — Z954 Presence of other heart-valve replacement: Secondary | ICD-10-CM

## 2015-03-01 DIAGNOSIS — R001 Bradycardia, unspecified: Secondary | ICD-10-CM | POA: Diagnosis not present

## 2015-03-01 DIAGNOSIS — I119 Hypertensive heart disease without heart failure: Secondary | ICD-10-CM

## 2015-03-01 DIAGNOSIS — I259 Chronic ischemic heart disease, unspecified: Secondary | ICD-10-CM

## 2015-03-01 NOTE — Patient Instructions (Signed)
Your physician recommends that you continue on your current medications as directed. Please refer to the Current Medication list given to you today.  Your physician recommends that you schedule a follow-up appointment in: [place]

## 2015-03-01 NOTE — Progress Notes (Signed)
Cardiology Office Note   Date:  03/01/2015   ID:  [name], MD, DOB 19-Nov-1941, MRN [id number]  PCP:  [name], MD  Cardiologist:   [name], MD   No chief complaint on file.     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   The patient has known ischemic heart disease and had coronary artery bypass graft surgery in 2006. 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 Postoperatively he had atrial fibrillation and was successfully cardioverted in August of 2012. Patient has history of renal insufficiency and is followed by nephrology. He has a history of arthritis and is followed by Dr. Charlestine Night. His primary care physician is Dr. Maceo Pro. The patient has diabetes and is followed by Dr. Buddy Duty. Patient has a history of osteoarthritis and is followed by Dr. [name]. He has also seen Dr. [name] and he underwent a right total knee replacement on 05/24/2014. His most recent echocardiogram on 05/28/14 showed normal systolic function with ejection fraction 60-65%. There was diastolic dysfunction. There was moderate aortic stenosis with peak gradient of 51 and mean gradient of 29. There was no aortic insufficiency. There was mild mitral stenosis and mild mitral regurgitation and there was mild tricuspid regurgitation. We repeated his echocardiogram on 12/13/14. His ejection fraction was 55-60%. There was diastolic dysfunction. The mean gradient across the aortic valve in systole was 29 which previously had been 36. There was moderate left atrial enlargement. The patient had successful colonoscopy by Dr. Oletta Lamas on January 29, 2014. The patient has been having some symptoms of anxiety and Dr. Casimiro Needle is following him. He is also  seeing a psychologist. He has been monitoring his blood pressure. His systolic blood pressures have been running high. He is watching his salt and is not having any peripheral edema.  He has remained in normal sinus rhythm since his cardioversion on 12/30/13. He has not been experiencing any chest discomfort.  He has had short of breath and fatigue which she attributes partially to an iron deficiency.  He is now on iron supplementation.  He has also been having a lot of problems with diarrhea and what appears to be a hyperactive gastrocolic reflex.  He takes 2 Lomotil each morning. He is diabetic.  He is no longer taking metformin.  His nephrologist stopped it because of evidence of some early acidosis.  Past Medical History  Diagnosis Date  . Coronary heart disease     stent, CABG, RBBB  . Valvular heart disease     aortic stenosis/regurgitation  . Sarcoid 2011    pulmonary and bone marrow  . Hypercalcemia 2011    due to sarcoidiosis  . Bone marrow disease   . Diabetes mellitus   . Hemorrhagic cystitis 2012  . Gout   . HTN (hypertension)   . A-fib     controlled with medication  . Myocardial infarction     1995, 2002, 2006  . OSA (obstructive sleep apnea)     use bipap setting of 10 and 12  . Complication of anesthesia 11-16-13    required alot of versed per anesthesia with cataract surgery  . Chronic kidney disease (CKD), stage III (moderate)     lov note dr Koleen Nimrod nephrology  09-17-13 on chart  . Heart murmur   . Atrial flutter     hx of  . CHF (congestive heart failure)   . Pneumonia 1966, 2011    hx of  . Anxiety   . Osteoarthritis   . DDD (degenerative disc disease)   . Synovial cyst of lumbar spine     l3-l4, l4-l5, injections  . Anemia   . Hepatitis     mono  . Cancer     basal cell  . Right sciatic nerve pain     for block thursday 01-21-2014, injections  . Fungal toenail infection   . Biceps tendon tear     right  . Diabetes     Past Surgical  History  Procedure Laterality Date  . Cataract surgery Right 2007  . Prostate surgery  oct. 2012    TURP  . Vasectomy  1977  . Redo median sternotomy, extracorporeal cirulation, avr, tricuspid valve repair  06/13/2011    AVR(23-mm Edwards pericardial Magna-Ease valve./ TVrepair (34-mm Edwards MC3 annuloplasty ring  . Coronary artery bypass graft  2006    x 5  . Cataract surgery Left 11-16-13  . Cardioversion N/A 12/30/2013    Procedure: CARDIOVERSION;  Surgeon: [name], MD;  Location: [facility] ENDOSCOPY;  Service: Cardiovascular;  Laterality: N/A;  10:49 cardioversion at 120 joules, then 150 joules, to SB  used  Lido 30mg ,  Propofol 160 mcg  . Colonoscopy N/A 01/29/2014    Procedure: COLONOSCOPY;  Surgeon: Winfield Cunas., MD;  Location: [facility] ENDOSCOPY;  Service: Endoscopy;  Laterality: N/A;  amanda//ja  . Cardioversion  2003, 2006, 2012, 2013  . Portacath placement    . Portacath removed    . Basal cell carcinoma excision Right 2015    ear  . Total knee arthroplasty Right 05/24/2014    Procedure: RIGHT TOTAL KNEE ARTHROPLASTY;  Surgeon: Gearlean Alf, MD;  Location: [facility] ORS;  Service: Orthopedics;  Laterality: Right;     Current Outpatient Prescriptions  Medication Sig Dispense Refill  . acetaminophen (TYLENOL) 500 MG tablet Take 1,000 mg by mouth every 6 (six) hours as needed for moderate pain.    [name] allopurinol (ZYLOPRIM) 100 MG tablet Take 200 mg by mouth daily with breakfast.     . amLODipine (NORVASC) 10 MG tablet TAKE 1 TABLET BY MOUTH EVERY DAY 90 tablet 0  . aspirin EC 81 MG tablet Take 81 mg by mouth at bedtime.     [name] CALCIUM-MAGNESIUM-VITAMIN D ER PO Take 1 tablet by mouth daily.     . diazepam (VALIUM) 10 MG tablet Take 1 tablet (10 mg total) by mouth at bedtime. FOR BLOOD PRESSURE (Patient taking differently: Take 10 mg by mouth as needed (leg cramps). FOR BLOOD PRESSURE) 30 tablet 3  . ethacrynic acid (EDECRIN) 25 MG tablet Take 12.5 mg by mouth daily.    [name]  HYDROmorphone (DILAUDID) 4 MG tablet Take one tablet by mouth every 4 hours as needed for pain; Take 1/2 tablet by mouth as needed if 4mg  not effective 180 tablet 0  . LANTUS SOLOSTAR 100 UNIT/ML Solostar Pen Inject 100 Units into the skin daily at 10 pm.   1  . lisinopril (PRINIVIL,ZESTRIL) 40 MG tablet Take 40 mg by mouth daily with breakfast.    . metoprolol tartrate (LOPRESSOR) 25 MG tablet TAKE 1 TABLET BY MOUTH TWICE A DAY 60 tablet 0  . ONETOUCH VERIO test strip 2 (two) times daily. use for testing  6  .  PRESCRIPTION MEDICATION Apply 1 drop topically at bedtime. DMSO with Lamisil    . terazosin (HYTRIN) 10 MG capsule Take 10 mg by mouth daily with breakfast.    . testosterone cypionate (DEPOTESTOTERONE CYPIONATE) 200 MG/ML injection Inject 8ml intramuscularly as directed twice a month 10 mL 0  . traMADol (ULTRAM) 50 MG tablet Take 50 mg by mouth as needed. for pain  0  . XARELTO 15 MG TABS tablet TAKE 1 TABLET BY MOUTH EVERY DAY 30 tablet 5  . zolpidem (AMBIEN) 10 MG tablet Take 10 mg by mouth at bedtime as needed for sleep.      No current facility-administered medications for this visit.    Allergies:   Actos; Hydralazine hcl; Penicillins; Amiodarone; Latex; Codeine; Depakote; Hydralazine; Loop diuretics; Losartan; Nsaids; Orudis; Pentazocine lactate; Poison ivy extract; Rozerem; Sertraline hcl; Benazepril hcl; Indomethacin; Minocycline; Potassium iodide; and Shellfish allergy    Social History:  The patient  reports that he quit smoking about 19 years ago. His smoking use included Pipe and Cigars. He has never used smokeless tobacco. He reports that he drinks alcohol. He reports that he does not use illicit drugs.   Family History:  The patient's family history includes Allergies in an other family member; Asthma in an other family member; Cancer in an other family member; Heart attack in his father; Heart disease in an other family member.    ROS:  Please see the history of present  illness.   Otherwise, review of systems are positive for none.   All other systems are reviewed and negative.    PHYSICAL EXAM: VS:  BP 160/58 mmHg  Pulse 46  Ht 5\' 11"  (1.803 m)  Wt 265 lb 9.6 oz (120.475 kg)  BMI 37.06 kg/m2 , BMI Body mass index is 37.06 kg/(m^2). GEN: Well nourished, well developed, in no acute distress HEENT: normal Neck: no JVD, carotid bruits, or masses Cardiac: Pulse is slow and regular in sinus bradycardia at 46/m.  There is a grade 3/6 systolic murmur across the prosthetic aortic valve.  No diastolic murmur. Respiratory:  clear to auscultation bilaterally, normal work of breathing GI: soft, nontender, nondistended, + BS MS: no deformity or atrophy Skin: warm and dry, no rash Neuro:  Strength and sensation are intact Psych: euthymic mood, full affect   EKG:  EKG is ordered today. The ekg ordered today demonstrates marked sinus bradycardia.  Left axis deviation with right bundle branch block.  Since prior tracing of 12/09/14, no significant change   Recent Labs: 05/30/2014: ALT 20; BUN 33*; Creatinine 2.06*; Hemoglobin 9.5*; Platelets 129*; Potassium 5.0; Sodium 138    Lipid Panel    Component Value Date/Time   CHOL 106 05/28/2014 0140   TRIG 169* 05/28/2014 0140   HDL 14* 05/28/2014 0140   CHOLHDL 7.6 05/28/2014 0140   VLDL 34 05/28/2014 0140   LDLCALC 58 05/28/2014 0140      Wt Readings from Last 3 Encounters:  03/01/15 265 lb 9.6 oz (120.475 kg)  12/30/14 257 lb 12.8 oz (116.937 kg)  12/09/14 253 lb 12.8 oz (115.123 kg)      Other studies Reviewed: Additional studies/ records that were reviewed today include: Recent labs from Dr. Maceo Pro at [place] [facility]  His iron saturation was 12% and his creatinine is 2.14 and BUN is 44 and hemoglobin is 10.7 data these tests was 02/02/15   ASSESSMENT AND PLAN:  1. Ischemic heart disease status post CABG in 2006. Ejection fraction 55-60% by echocardiogram 12/13/14  2. History of severe aortic  stenosis, with successful prosthetic aortic valve replacement in 2012. Most recent echocardiogram 12/13/14 shows mean gradient of 29 across the prosthetic aortic valve. 3. Past history of atrial fibrillation/flutter, holding normal sinus rhythm.  Last cardioversion was in January 2015 4. Status post tricuspid valve repair 2012 5. Diabetes mellitus,type 2 6. Chronic kidney disease followed by Dr.Coladanato 7. Essential hypertension  Disposition: Continue same medication.  The patient has an appointment with his nephrologist tomorrow to discuss his blood pressure further.  His marked bradycardia prevents [place] from pushing his beta blockers higher.   Current medicines are reviewed at length with the patient today.  The patient does not have concerns regarding medicines.  The following changes have been made:  no change  Labs/ tests ordered today include:   Orders Placed This Encounter  Procedures  . EKG 12-Lead     Disposition: Continue current medication.  Recheck in 3 months for office visit and EKG   Signed, [name], MD  03/01/2015 5:06 PM    [facility] [place], [place], [place]  [address] Phone: [phone]; Fax: [phone]

## 2015-03-23 ENCOUNTER — Inpatient Hospital Stay (HOSPITAL_COMMUNITY)
Admission: EM | Admit: 2015-03-23 | Discharge: 2015-03-26 | DRG: 682 | Disposition: A | Discharge status: Home / Self Care | Payer: Medicare Other | Attending: Internal Medicine | Admitting: Internal Medicine

## 2015-03-23 ENCOUNTER — Other Ambulatory Visit: Payer: Self-pay | Admitting: Cardiology

## 2015-03-23 ENCOUNTER — Encounter (HOSPITAL_COMMUNITY): Payer: Self-pay | Admitting: *Deleted

## 2015-03-23 DIAGNOSIS — Z8701 Personal history of pneumonia (recurrent): Secondary | ICD-10-CM

## 2015-03-23 DIAGNOSIS — I350 Nonrheumatic aortic (valve) stenosis: Secondary | ICD-10-CM | POA: Diagnosis present

## 2015-03-23 DIAGNOSIS — Z951 Presence of aortocoronary bypass graft: Secondary | ICD-10-CM

## 2015-03-23 DIAGNOSIS — Z96651 Presence of right artificial knee joint: Secondary | ICD-10-CM | POA: Diagnosis present

## 2015-03-23 DIAGNOSIS — Z7982 Long term (current) use of aspirin: Secondary | ICD-10-CM | POA: Diagnosis not present

## 2015-03-23 DIAGNOSIS — M109 Gout, unspecified: Secondary | ICD-10-CM | POA: Diagnosis present

## 2015-03-23 DIAGNOSIS — F419 Anxiety disorder, unspecified: Secondary | ICD-10-CM | POA: Diagnosis present

## 2015-03-23 DIAGNOSIS — Z7901 Long term (current) use of anticoagulants: Secondary | ICD-10-CM

## 2015-03-23 DIAGNOSIS — Z880 Allergy status to penicillin: Secondary | ICD-10-CM | POA: Diagnosis not present

## 2015-03-23 DIAGNOSIS — Z953 Presence of xenogenic heart valve: Secondary | ICD-10-CM | POA: Diagnosis not present

## 2015-03-23 DIAGNOSIS — Z91040 Latex allergy status: Secondary | ICD-10-CM | POA: Diagnosis not present

## 2015-03-23 DIAGNOSIS — Z87891 Personal history of nicotine dependence: Secondary | ICD-10-CM

## 2015-03-23 DIAGNOSIS — N184 Chronic kidney disease, stage 4 (severe): Secondary | ICD-10-CM | POA: Diagnosis present

## 2015-03-23 DIAGNOSIS — K226 Gastro-esophageal laceration-hemorrhage syndrome: Secondary | ICD-10-CM | POA: Diagnosis present

## 2015-03-23 DIAGNOSIS — K922 Gastrointestinal hemorrhage, unspecified: Secondary | ICD-10-CM | POA: Diagnosis not present

## 2015-03-23 DIAGNOSIS — Z91013 Allergy to seafood: Secondary | ICD-10-CM

## 2015-03-23 DIAGNOSIS — E860 Dehydration: Secondary | ICD-10-CM | POA: Diagnosis present

## 2015-03-23 DIAGNOSIS — N179 Acute kidney failure, unspecified: Secondary | ICD-10-CM | POA: Diagnosis present

## 2015-03-23 DIAGNOSIS — E871 Hypo-osmolality and hyponatremia: Secondary | ICD-10-CM | POA: Diagnosis present

## 2015-03-23 DIAGNOSIS — R112 Nausea with vomiting, unspecified: Secondary | ICD-10-CM | POA: Diagnosis present

## 2015-03-23 DIAGNOSIS — K219 Gastro-esophageal reflux disease without esophagitis: Secondary | ICD-10-CM | POA: Diagnosis present

## 2015-03-23 DIAGNOSIS — M1990 Unspecified osteoarthritis, unspecified site: Secondary | ICD-10-CM | POA: Diagnosis present

## 2015-03-23 DIAGNOSIS — Z885 Allergy status to narcotic agent status: Secondary | ICD-10-CM

## 2015-03-23 DIAGNOSIS — G4733 Obstructive sleep apnea (adult) (pediatric): Secondary | ICD-10-CM | POA: Diagnosis present

## 2015-03-23 DIAGNOSIS — N170 Acute kidney failure with tubular necrosis: Secondary | ICD-10-CM | POA: Diagnosis not present

## 2015-03-23 DIAGNOSIS — R0781 Pleurodynia: Secondary | ICD-10-CM | POA: Diagnosis not present

## 2015-03-23 DIAGNOSIS — R630 Anorexia: Secondary | ICD-10-CM | POA: Diagnosis present

## 2015-03-23 DIAGNOSIS — E119 Type 2 diabetes mellitus without complications: Secondary | ICD-10-CM | POA: Diagnosis present

## 2015-03-23 DIAGNOSIS — I2510 Atherosclerotic heart disease of native coronary artery without angina pectoris: Secondary | ICD-10-CM | POA: Diagnosis present

## 2015-03-23 DIAGNOSIS — I4891 Unspecified atrial fibrillation: Secondary | ICD-10-CM | POA: Diagnosis present

## 2015-03-23 DIAGNOSIS — Z952 Presence of prosthetic heart valve: Secondary | ICD-10-CM | POA: Diagnosis not present

## 2015-03-23 DIAGNOSIS — Z9181 History of falling: Secondary | ICD-10-CM

## 2015-03-23 DIAGNOSIS — R531 Weakness: Secondary | ICD-10-CM | POA: Diagnosis not present

## 2015-03-23 DIAGNOSIS — I252 Old myocardial infarction: Secondary | ICD-10-CM

## 2015-03-23 DIAGNOSIS — Z886 Allergy status to analgesic agent status: Secondary | ICD-10-CM

## 2015-03-23 DIAGNOSIS — Z888 Allergy status to other drugs, medicaments and biological substances status: Secondary | ICD-10-CM | POA: Diagnosis not present

## 2015-03-23 DIAGNOSIS — D649 Anemia, unspecified: Secondary | ICD-10-CM | POA: Diagnosis present

## 2015-03-23 DIAGNOSIS — E875 Hyperkalemia: Secondary | ICD-10-CM | POA: Diagnosis present

## 2015-03-23 DIAGNOSIS — K920 Hematemesis: Secondary | ICD-10-CM | POA: Diagnosis not present

## 2015-03-23 DIAGNOSIS — K5900 Constipation, unspecified: Secondary | ICD-10-CM | POA: Diagnosis present

## 2015-03-23 DIAGNOSIS — I129 Hypertensive chronic kidney disease with stage 1 through stage 4 chronic kidney disease, or unspecified chronic kidney disease: Secondary | ICD-10-CM | POA: Diagnosis present

## 2015-03-23 DIAGNOSIS — N189 Chronic kidney disease, unspecified: Secondary | ICD-10-CM

## 2015-03-23 DIAGNOSIS — R001 Bradycardia, unspecified: Secondary | ICD-10-CM

## 2015-03-23 DIAGNOSIS — I509 Heart failure, unspecified: Secondary | ICD-10-CM | POA: Diagnosis present

## 2015-03-23 DIAGNOSIS — Z794 Long term (current) use of insulin: Secondary | ICD-10-CM | POA: Diagnosis not present

## 2015-03-23 DIAGNOSIS — I482 Chronic atrial fibrillation, unspecified: Secondary | ICD-10-CM | POA: Diagnosis present

## 2015-03-23 LAB — COMPREHENSIVE METABOLIC PANEL
ALK PHOS: 73 U/L (ref 39–117)
ALT: 11 U/L (ref 0–53)
AST: 16 U/L (ref 0–37)
Albumin: 3.6 g/dL (ref 3.5–5.2)
Anion gap: 17 — ABNORMAL HIGH (ref 5–15)
BILIRUBIN TOTAL: 0.6 mg/dL (ref 0.3–1.2)
BUN: 121 mg/dL — ABNORMAL HIGH (ref 6–23)
CALCIUM: 8.8 mg/dL (ref 8.4–10.5)
CO2: 11 mmol/L — ABNORMAL LOW (ref 19–32)
CREATININE: 7.62 mg/dL — AB (ref 0.50–1.35)
Chloride: 96 mmol/L (ref 96–112)
GFR calc Af Amer: 7 mL/min — ABNORMAL LOW (ref >90)
GFR, EST NON AFRICAN AMERICAN: 6 mL/min — AB (ref >90)
Glucose, Bld: 129 mg/dL — ABNORMAL HIGH (ref 70–99)
POTASSIUM: 6.3 mmol/L — AB (ref 3.5–5.1)
SODIUM: 124 mmol/L — AB (ref 135–145)
Total Protein: 6.5 g/dL (ref 6.0–8.3)

## 2015-03-23 LAB — CBC WITH DIFFERENTIAL/PLATELET
Basophils Absolute: 0 10*3/uL (ref 0.0–0.1)
Basophils Relative: 0 % (ref 0–1)
EOS ABS: 0.1 10*3/uL (ref 0.0–0.7)
EOS PCT: 2 % (ref 0–5)
HEMATOCRIT: 31.6 % — AB (ref 39.0–52.0)
Hemoglobin: 10.6 g/dL — ABNORMAL LOW (ref 13.0–17.0)
Lymphocytes Relative: 7 % — ABNORMAL LOW (ref 12–46)
Lymphs Abs: 0.3 10*3/uL — ABNORMAL LOW (ref 0.7–4.0)
MCH: 29.3 pg (ref 26.0–34.0)
MCHC: 33.5 g/dL (ref 30.0–36.0)
MCV: 87.3 fL (ref 78.0–100.0)
MONO ABS: 0.4 10*3/uL (ref 0.1–1.0)
Monocytes Relative: 9 % (ref 3–12)
Neutro Abs: 3.8 10*3/uL (ref 1.7–7.7)
Neutrophils Relative %: 82 % — ABNORMAL HIGH (ref 43–77)
Platelets: 143 10*3/uL — ABNORMAL LOW (ref 150–400)
RBC: 3.62 MIL/uL — ABNORMAL LOW (ref 4.22–5.81)
RDW: 14.8 % (ref 11.5–15.5)
WBC: 4.7 10*3/uL (ref 4.0–10.5)

## 2015-03-23 LAB — I-STAT CHEM 8, ED
BUN: 119 mg/dL — ABNORMAL HIGH (ref 6–23)
CALCIUM ION: 1.07 mmol/L — AB (ref 1.13–1.30)
Chloride: 100 mmol/L (ref 96–112)
Creatinine, Ser: 8 mg/dL — ABNORMAL HIGH (ref 0.50–1.35)
GLUCOSE: 128 mg/dL — AB (ref 70–99)
HCT: 36 % — ABNORMAL LOW (ref 39.0–52.0)
Hemoglobin: 12.2 g/dL — ABNORMAL LOW (ref 13.0–17.0)
POTASSIUM: 6.2 mmol/L — AB (ref 3.5–5.1)
Sodium: 123 mmol/L — ABNORMAL LOW (ref 135–145)
TCO2: 11 mmol/L (ref 0–100)

## 2015-03-23 LAB — I-STAT CG4 LACTIC ACID, ED: Lactic Acid, Venous: 1.54 mmol/L (ref 0.5–2.0)

## 2015-03-23 MED ORDER — HYDROMORPHONE HCL 1 MG/ML IJ SOLN
1.0000 mg | Freq: Once | INTRAMUSCULAR | Status: AC
  Administered 2015-03-23: 1 mg via INTRAVENOUS
  Filled 2015-03-23: qty 1

## 2015-03-23 MED ORDER — SODIUM CHLORIDE 0.9 % IV SOLN
1.0000 g | Freq: Once | INTRAVENOUS | Status: AC
  Administered 2015-03-23: 1 g via INTRAVENOUS
  Filled 2015-03-23: qty 10

## 2015-03-23 MED ORDER — SODIUM BICARBONATE 8.4 % IV SOLN
50.0000 meq | Freq: Once | INTRAVENOUS | Status: AC
  Administered 2015-03-23: 50 meq via INTRAVENOUS
  Filled 2015-03-23: qty 50

## 2015-03-23 MED ORDER — SODIUM CHLORIDE 0.9 % IV BOLUS (SEPSIS)
500.0000 mL | Freq: Once | INTRAVENOUS | Status: AC
  Administered 2015-03-23: 500 mL via INTRAVENOUS

## 2015-03-23 MED ORDER — SODIUM CHLORIDE 0.9 % IV SOLN
80.0000 mg | Freq: Once | INTRAVENOUS | Status: AC
  Administered 2015-03-24: 80 mg via INTRAVENOUS
  Filled 2015-03-23: qty 80

## 2015-03-23 MED ORDER — ONDANSETRON HCL 4 MG/2ML IJ SOLN
4.0000 mg | Freq: Once | INTRAMUSCULAR | Status: AC
  Administered 2015-03-23: 4 mg via INTRAVENOUS
  Filled 2015-03-23: qty 2

## 2015-03-23 MED ORDER — PANTOPRAZOLE SODIUM 40 MG IV SOLR: 40.0000 mg | Freq: Two times a day (BID) | INTRAVENOUS | Status: DC

## 2015-03-23 MED ORDER — SODIUM CHLORIDE 0.9 % IV BOLUS (SEPSIS)
1000.0000 mL | Freq: Once | INTRAVENOUS | Status: AC
Start: 2015-03-23 — End: 2015-03-23
  Administered 2015-03-23: 1000 mL via INTRAVENOUS

## 2015-03-23 MED ORDER — SODIUM BICARBONATE 8.4 % IV SOLN
INTRAVENOUS | Status: DC
  Administered 2015-03-23 – 2015-03-24 (×2): via INTRAVENOUS
  Filled 2015-03-23 (×5): qty 150

## 2015-03-23 MED ORDER — SODIUM CHLORIDE 0.9 % IV SOLN
8.0000 mg/h | INTRAVENOUS | Status: DC
  Administered 2015-03-24 (×2): 8 mg/h via INTRAVENOUS
  Filled 2015-03-23 (×7): qty 80

## 2015-03-23 NOTE — Consult Note (Signed)
Reason for Consult: bradycardia Primary Cardiologist: Dr. Mare Ferrari Referring Physician: Dr. Carol Ada II, MD is an 74 y.o. male.  HPI: Mr. 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 Mare Ferrari in Cardiologist who follows him for atrial fibrillation, CAD s/p CABG '06, AS s/p AVR with tissue valve 6/12, significant arthritis, and Dr. [name] for CKD with creatinine 2-2.2 who fell ~ 1 week ago with resultant right rib fracture and bruising on upper body/lower body. He has been taking pain medications and not taking in much PO intake. He continue to have symptoms and his wife ultimately brought him into the ER. He was found to have acute renal failure, hyperkalemia and bradycardia. He has known bradycardia in the 30s. He's received IV fluids, calcium and already seen by renal and internal medicine. When I arrived there, he had some nausea and mild vomiting. He actually saw Dr. Mare Ferrari on 03/01/15   Past Medical History  Diagnosis Date  . Coronary heart disease     stent, CABG, RBBB  . Valvular heart disease     aortic stenosis/regurgitation  . Sarcoid 2011    pulmonary and bone marrow  . Hypercalcemia 2011    due to sarcoidiosis  . Bone marrow disease   . Diabetes mellitus   . Hemorrhagic cystitis 2012  . Gout   . HTN (hypertension)   . A-fib     controlled with medication  . Myocardial infarction     1995, 2002, 2006  . OSA (obstructive sleep apnea)     use bipap setting of 10 and 12  . Complication of anesthesia 11-16-13    required alot of versed per anesthesia with cataract surgery  . Chronic kidney disease (CKD), stage III (moderate)     lov note dr Koleen Nimrod nephrology 09-17-13 on chart  . Heart murmur   . Atrial flutter     hx of  . CHF (congestive heart failure)   . Pneumonia 1966, 2011    hx of  . Anxiety   . Osteoarthritis   . DDD (degenerative disc disease)   . Synovial cyst of lumbar spine     l3-l4, l4-l5, injections  .  Anemia   . Hepatitis     mono  . Cancer     basal cell  . Right sciatic nerve pain     for block thursday 01-21-2014, injections  . Fungal toenail infection   . Biceps tendon tear     right  . Diabetes     Past Surgical History  Procedure Laterality Date  . Cataract surgery Right 2007  . Prostate surgery  oct. 2012    TURP  . Vasectomy  1977  . Redo median sternotomy, extracorporeal cirulation, avr, tricuspid valve repair  06/13/2011    AVR(23-mm Edwards pericardial Magna-Ease valve./ TVrepair (34-mm Edwards MC3 annuloplasty ring  . Coronary artery bypass graft  2006    x 5  . Cataract surgery Left 11-16-13  . Cardioversion N/A 12/30/2013    Procedure: CARDIOVERSION;  Surgeon: [name], MD;  Location[facility] ENDOSCOPY;  Service: Cardiovascular;  Laterality: N/A;  10:49 cardioversion at 120 joules, then 150 joules, to SB  used  Lido 54m,  Propofol 160 mcg  . Colonoscopy N/A 01/29/2014    Procedure: COLONOSCOPY;  Surgeon: JWinfield Cunas, MD;  [facility];  Service: Endoscopy;  Laterality: N/A;  amanda//ja  . Cardioversion  2003, 2006, 2012, 2013  . Portacath placement    .  Portacath removed    . Basal cell carcinoma excision Right 2015    ear  . Total knee arthroplasty Right 05/24/2014    Procedure: RIGHT TOTAL KNEE ARTHROPLASTY;  Surgeon: Gearlean Alf, MD;  Location: [facility] ORS;  Service: Orthopedics;  Laterality: Right;    Family History  Problem Relation Age of Onset  . Heart attack Father   . Allergies    . Asthma    . Heart disease    . Cancer      Social History:  reports that he quit smoking about 19 years ago. His smoking use included Pipe and Cigars. He has never used smokeless tobacco. He reports that he drinks alcohol. He reports that he does not use illicit drugs.  Allergies:  Allergies  Allergen Reactions  . Actos [Pioglitazone Hydrochloride] Swelling and Other (See Comments)    Severe peripheral edema  . Penicillins Swelling and Other (See  Comments)    Serum sickness  . Amiodarone Other (See Comments)    diarrhea  . Latex Swelling  . Codeine Other (See Comments)    Makes Pt aggressive  . Depakote [Divalproex Sodium] Diarrhea    severe  . Januvia [Sitagliptin] Diarrhea and Other (See Comments)    bradycardia  . Loop Diuretics Other (See Comments)    Spike in BUN levels  . Losartan Other (See Comments)    aphysia  . Nsaids Other (See Comments)    Renal problems  . Onglyza [Saxagliptin] Diarrhea and Other (See Comments)    bradycardia  . Orudis [Ketoprofen] Other (See Comments)    Cannot take due to renal insufficiency  . Pentazocine Lactate Other (See Comments)    Unknown allergic reaction - pt and wife do not recall this  . Rozerem [Ramelteon] Diarrhea    Severe   . Sertraline Hcl Other (See Comments)    Pt and wife do not recall this  . Benazepril Hcl Other (See Comments)    ? Possible lowers plateletes  . Indomethacin Other (See Comments)    Renal problems   . Minocycline Other (See Comments)    Makes dizzy and feel just lousy.  . Poison Ivy Extract [Extract Of Poison Ivy] Rash and Other (See Comments)    blisters  . Potassium Iodide Itching and Rash  . Shellfish Allergy Rash    Medications:  I have reviewed the patient's current medications. Prior to Admission:  Prescriptions prior to admission  Medication Sig Dispense Refill Last Dose  . acetaminophen (TYLENOL) 500 MG tablet Take 1,000 mg by mouth every 6 (six) hours as needed (pain).    03/23/2015 at 1700  . allopurinol (ZYLOPRIM) 100 MG tablet Take 200 mg by mouth daily with breakfast.    03/23/2015 at Unknown time  . amLODipine (NORVASC) 10 MG tablet TAKE 1 TABLET BY MOUTH EVERY DAY 90 tablet 0 03/23/2015 at Unknown time  . aspirin EC 81 MG tablet Take 81 mg by mouth at bedtime.    03/23/2015 at Unknown time  . CALCIUM-MAGNESIUM-VITAMIN D ER PO Take 1 tablet by mouth daily. Calcium citrate 200 mg, magnesium 100 mg, Vitamin D 200 units   03/23/2015 at  Unknown time  . diazepam (VALIUM) 10 MG tablet Take 1 tablet (10 mg total) by mouth at bedtime. FOR BLOOD PRESSURE (Patient taking differently: Take 10 mg by mouth as needed (leg/ankle cramps). FOR BLOOD PRESSURE) 30 tablet 3 week ago  . HYDROmorphone (DILAUDID) 2 MG tablet Take 2 mg by mouth every 4 (four) hours as  needed (pain).   0 03/22/2015 at Unknown time  . insulin glargine (LANTUS) 100 unit/mL SOPN Inject 2-3 Units into the skin at bedtime as needed (CBG >120). CBG 120-140 2 units, 140-160 3 units   03/15/2015  . lisinopril (PRINIVIL,ZESTRIL) 40 MG tablet Take 40 mg by mouth daily with breakfast.   03/23/2015 at Unknown time  . metoprolol tartrate (LOPRESSOR) 25 MG tablet TAKE 1 TABLET BY MOUTH TWICE A DAY 60 tablet 0 03/23/2015 at 1700  . terazosin (HYTRIN) 10 MG capsule Take 10 mg by mouth daily with breakfast.   03/23/2015 at Unknown time  . testosterone cypionate (DEPOTESTOTERONE CYPIONATE) 200 MG/ML injection Inject 29m intramuscularly as directed twice a month (Patient taking differently: Inject 200 mg into the muscle every 14 (fourteen) days. Last injection March 16 or 17, 2016) 10 mL 0 3/16 or 3/17  . traMADol (ULTRAM) 50 MG tablet Take 50-100 mg by mouth every 4 (four) hours as needed (pain).   0 03/23/2015 at 1700  . XARELTO 15 MG TABS tablet TAKE 1 TABLET BY MOUTH EVERY DAY (Patient taking differently: TAKE 1 TABLET BY MOUTH DAILY WITH SUPPER) 30 tablet 5 03/23/2015 at 1730  . zolpidem (AMBIEN) 10 MG tablet Take 10 mg by mouth at bedtime as needed for sleep.    3-4 nights ago  . hydrALAZINE (APRESOLINE) 25 MG tablet Take 25 mg by mouth 2 (two) times daily.  6 03/21/2015  . HYDROmorphone (DILAUDID) 4 MG tablet Take one tablet by mouth every 4 hours as needed for pain; Take 1/2 tablet by mouth as needed if 432mnot effective (Patient not taking: Reported on 03/23/2015) 180 tablet 0 Not Taking at Unknown time  . isosorbide dinitrate (ISORDIL) 20 MG tablet Take 20 mg by mouth 2 (two) times daily.   6 03/21/2015  . ONETOUCH VERIO test strip 2 (two) times daily. use for testing  6 Taking   Scheduled: . amLODipine  10 mg Oral Daily  . aspirin EC  81 mg Oral QHS  . insulin aspart  0-5 Units Subcutaneous QHS  . insulin aspart  0-9 Units Subcutaneous TID WC  . pantoprazole (PROTONIX) IV  80 mg Intravenous Once  . [START ON 03/27/2015] pantoprazole (PROTONIX) IV  40 mg Intravenous Q12H  . sodium chloride  3 mL Intravenous Q12H  . terazosin  10 mg Oral Q breakfast    Results for orders placed or performed during the hospital encounter of 03/23/15 (from the past 48 hour(s))  CBC with Differential     Status: Abnormal   Collection Time: 03/23/15  7:43 PM  Result Value Ref Range   WBC 4.7 4.0 - 10.5 K/uL   RBC 3.62 (L) 4.22 - 5.81 MIL/uL   Hemoglobin 10.6 (L) 13.0 - 17.0 g/dL   HCT 31.6 (L) 39.0 - 52.0 %   MCV 87.3 78.0 - 100.0 fL   MCH 29.3 26.0 - 34.0 pg   MCHC 33.5 30.0 - 36.0 g/dL   RDW 14.8 11.5 - 15.5 %   Platelets 143 (L) 150 - 400 K/uL   Neutrophils Relative % 82 (H) 43 - 77 %   Neutro Abs 3.8 1.7 - 7.7 K/uL   Lymphocytes Relative 7 (L) 12 - 46 %   Lymphs Abs 0.3 (L) 0.7 - 4.0 K/uL   Monocytes Relative 9 3 - 12 %   Monocytes Absolute 0.4 0.1 - 1.0 K/uL   Eosinophils Relative 2 0 - 5 %   Eosinophils Absolute 0.1 0.0 - 0.7 K/uL  Basophils Relative 0 0 - 1 %   Basophils Absolute 0.0 0.0 - 0.1 K/uL  Comprehensive metabolic panel     Status: Abnormal   Collection Time: 03/23/15  7:43 PM  Result Value Ref Range   Sodium 124 (L) 135 - 145 mmol/L   Potassium 6.3 (HH) 3.5 - 5.1 mmol/L    Comment: REPEATED TO VERIFY CRITICAL RESULT CALLED TO, READ BACK BY AND VERIFIED WITH: Pamalee Leyden 2047 03/23/15 WBOND    Chloride 96 96 - 112 mmol/L   CO2 11 (L) 19 - 32 mmol/L   Glucose, Bld 129 (H) 70 - 99 mg/dL   BUN 121 (H) 6 - 23 mg/dL   Creatinine, Ser 7.62 (H) 0.50 - 1.35 mg/dL   Calcium 8.8 8.4 - 10.5 mg/dL   Total Protein 6.5 6.0 - 8.3 g/dL   Albumin 3.6 3.5 - 5.2 g/dL   AST  16 0 - 37 U/L   ALT 11 0 - 53 U/L   Alkaline Phosphatase 73 39 - 117 U/L   Total Bilirubin 0.6 0.3 - 1.2 mg/dL   GFR calc non Af Amer 6 (L) >90 mL/min   GFR calc Af Amer 7 (L) >90 mL/min    Comment: (NOTE) The eGFR has been calculated using the CKD EPI equation. This calculation has not been validated in all clinical situations. eGFR's persistently <90 mL/min signify possible Chronic Kidney Disease.    Anion gap 17 (H) 5 - 15  I-stat Chem 8, ED     Status: Abnormal   Collection Time: 03/23/15  7:43 PM  Result Value Ref Range   Sodium 123 (L) 135 - 145 mmol/L   Potassium 6.2 (HH) 3.5 - 5.1 mmol/L   Chloride 100 96 - 112 mmol/L   BUN 119 (H) 6 - 23 mg/dL   Creatinine, Ser 8.00 (H) 0.50 - 1.35 mg/dL   Glucose, Bld 128 (H) 70 - 99 mg/dL   Calcium, Ion 1.07 (L) 1.13 - 1.30 mmol/L   TCO2 11 0 - 100 mmol/L   Hemoglobin 12.2 (L) 13.0 - 17.0 g/dL   HCT 36.0 (L) 39.0 - 52.0 %  I-Stat CG4 Lactic Acid, ED     Status: None   Collection Time: 03/23/15  7:44 PM  Result Value Ref Range   Lactic Acid, Venous 1.54 0.5 - 2.0 mmol/L    No results found.  Review of Systems  Constitutional: Positive for fever, chills and malaise/fatigue.  HENT: Negative for ear pain.   Eyes: Negative for double vision and photophobia.  Respiratory: Negative for cough.   Cardiovascular: Negative for chest pain, palpitations and orthopnea.  Gastrointestinal: Positive for nausea and vomiting. Negative for abdominal pain, blood in stool and melena.  Genitourinary: Negative for dysuria and frequency.  Musculoskeletal: Positive for joint pain and falls.  Skin: Negative for itching and rash.  Neurological: Negative for dizziness, tingling, tremors, sensory change and headaches.  Endo/Heme/Allergies: Negative for polydipsia. Bruises/bleeds easily.  Psychiatric/Behavioral: Negative for depression, suicidal ideas and substance abuse.   Blood pressure 147/42, pulse 32, temperature 97.5 F (36.4 C), resp. rate 12,  height 6' (1.829 m), weight 113.399 kg (250 lb), SpO2 93 %. Physical Exam  Nursing note and vitals reviewed. Constitutional: He is oriented to person, place, and time. He appears well-developed and well-nourished. He appears distressed.  HENT:  Head: Normocephalic and atraumatic.  Nose: Nose normal.  Mouth/Throat: Oropharynx is clear and moist. No oropharyngeal exudate.  Eyes: Conjunctivae and EOM are normal. Pupils are equal, round,  and reactive to light. No scleral icterus.  Neck: Normal range of motion. Neck supple. No JVD present. No tracheal deviation present.  Cardiovascular: Exam reveals no gallop.   Murmur heard. Bradycardic, systolic murmur at LSB  Respiratory: Effort normal and breath sounds normal. No respiratory distress. He has no wheezes.  GI: Soft. Bowel sounds are normal. He exhibits no distension. There is no tenderness.  Musculoskeletal: Normal range of motion. He exhibits edema.  Trace edema bilateral lower extremities  Neurological: He is alert and oriented to person, place, and time. No cranial nerve deficit. Coordination normal.  Skin: Skin is warm and dry. No rash noted. He is not diaphoretic. No erythema.  Psychiatric: He has a normal mood and affect. His behavior is normal. Judgment and thought content normal.  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  Broad differential for ARF but appears largely hypovolemic with some ischemic component given hypotension/hypoperfusion. Appreciate renal greatly. His ARF is certainly driving his hyperkalemia which is affecting his HR/bradycardia. Goal to treat underlying cause with fluids, time, perfusion - hold xarelto given ARF (although 1/3 metabolized by kidneys and 2/3 hepatic clearance) - reassess anticoagluation as renal function improves; can start heparin gtt if renal failure persists  - watch HR on telemetry/stepdown - HR already improving with resuscitation  - follow-up BMP/K closely - continue treatment  Aulden Calise 03/23/2015, 11:22 PM

## 2015-03-23 NOTE — ED Provider Notes (Signed)
CSN: [id number]     Arrival date & time 03/23/15  1846 History   First MD Initiated Contact with Patient 03/23/15 1905     Chief Complaint  Patient presents with  . Abnormal Lab     (Consider location/radiation/quality/duration/timing/severity/associated sxs/prior Treatment) The history is provided by the patient.   patient is retired Sales promotion account executive. Sent in for worsening renal function and hyperkalemia. Reportedly had potassium of 6.2 and creatinine of 7.5 on lab work drawn by his nephrologist. Patient's been eating and drinking less since he had a fall in a rib fracture. He is on oral Dilaudid for pain control at home. Has a history with a baseline creatinine of about 2.2. Found to be bradycardic. States he has a history of bradycardia and that his heart rate runs in the upper 30s or 40s at his baseline. States he has not had decreased urination. He has had some chest pain from the rib fracture. No lightheadedness or dizziness.  Past Medical History  Diagnosis Date  . Coronary heart disease     stent, CABG, RBBB  . Valvular heart disease     aortic stenosis/regurgitation  . Sarcoid 2011    pulmonary and bone marrow  . Hypercalcemia 2011    due to sarcoidiosis  . Bone marrow disease   . Diabetes mellitus   . Hemorrhagic cystitis 2012  . Gout   . HTN (hypertension)   . A-fib     controlled with medication  . Myocardial infarction     1995, 2002, 2006  . OSA (obstructive sleep apnea)     use bipap setting of 10 and 12  . Complication of anesthesia 11-16-13    required alot of versed per anesthesia with cataract surgery  . Chronic kidney disease (CKD), stage III (moderate)     lov note dr Koleen Nimrod nephrology 09-17-13 on chart  . Heart murmur   . Atrial flutter     hx of  . CHF (congestive heart failure)   . Pneumonia 1966, 2011    hx of  . Anxiety   . Osteoarthritis   . DDD (degenerative disc disease)   . Synovial cyst of lumbar spine     l3-l4, l4-l5,  injections  . Anemia   . Hepatitis     mono  . Cancer     basal cell  . Right sciatic nerve pain     for block thursday 01-21-2014, injections  . Fungal toenail infection   . Biceps tendon tear     right  . Diabetes    Past Surgical History  Procedure Laterality Date  . Cataract surgery Right 2007  . Prostate surgery  oct. 2012    TURP  . Vasectomy  1977  . Redo median sternotomy, extracorporeal cirulation, avr, tricuspid valve repair  06/13/2011    AVR(23-mm Edwards pericardial Magna-Ease valve./ TVrepair (34-mm Edwards MC3 annuloplasty ring  . Coronary artery bypass graft  2006    x 5  . Cataract surgery Left 11-16-13  . Cardioversion N/A 12/30/2013    Procedure: CARDIOVERSION;  Surgeon: [name], MD;  Location: [facility] ENDOSCOPY;  Service: Cardiovascular;  Laterality: N/A;  10:49 cardioversion at 120 joules, then 150 joules, to SB  used  Lido 30mg ,  Propofol 160 mcg  . Colonoscopy N/A 01/29/2014    Procedure: COLONOSCOPY;  Surgeon: Winfield Cunas., MD;  Location: [facility] ENDOSCOPY;  Service: Endoscopy;  Laterality: N/A;  amanda//ja  . Cardioversion  2003, 2006, 2012, 2013  . Portacath  placement    . Portacath removed    . Basal cell carcinoma excision Right 2015    ear  . Total knee arthroplasty Right 05/24/2014    Procedure: RIGHT TOTAL KNEE ARTHROPLASTY;  Surgeon: Gearlean Alf, MD;  Location: [facility] ORS;  Service: Orthopedics;  Laterality: Right;   Family History  Problem Relation Age of Onset  . Heart attack Father   . Allergies    . Asthma    . Heart disease    . Cancer     History  Substance Use Topics  . Smoking status: Former Smoker -- 28 years    Types: Pipe, Cigars    Quit date: 12/25/1995  . Smokeless tobacco: Never Used  . Alcohol Use: Yes     Comment: 3-5 beer or wine per week    Review of Systems  Constitutional: Positive for appetite change. Negative for activity change.  Eyes: Negative for pain.  Respiratory: Negative for chest tightness and  shortness of breath.   Cardiovascular: Positive for chest pain. Negative for leg swelling.  Gastrointestinal: Negative for nausea, vomiting, abdominal pain and diarrhea.  Genitourinary: Negative for flank pain.  Musculoskeletal: Negative for back pain and neck stiffness.  Skin: Negative for rash.  Neurological: Negative for weakness, numbness and headaches.  Hematological: Bruises/bleeds easily.  Psychiatric/Behavioral: Negative for behavioral problems.      Allergies  Actos; Penicillins; Amiodarone; Latex; Codeine; Depakote; Januvia; Loop diuretics; Losartan; Nsaids; Onglyza; Orudis; Pentazocine lactate; Rozerem; Sertraline hcl; Benazepril hcl; Indomethacin; Minocycline; Poison ivy extract; Potassium iodide; and Shellfish allergy  Home Medications   Prior to Admission medications   Medication Sig Start Date End Date Taking? Authorizing Provider  acetaminophen (TYLENOL) 500 MG tablet Take 1,000 mg by mouth every 6 (six) hours as needed (pain).    Yes Historical Provider, MD  allopurinol (ZYLOPRIM) 100 MG tablet Take 200 mg by mouth daily with breakfast.  08/06/11  Yes [name], MD  aspirin EC 81 MG tablet Take 81 mg by mouth at bedtime.    Yes Historical Provider, MD  CALCIUM-MAGNESIUM-VITAMIN D ER PO Take 1 tablet by mouth daily. Calcium citrate 200 mg, magnesium 100 mg, Vitamin D 200 units   Yes Historical Provider, MD  diazepam (VALIUM) 10 MG tablet Take 1 tablet (10 mg total) by mouth at bedtime. FOR BLOOD PRESSURE Patient taking differently: Take 10 mg by mouth as needed (leg/ankle cramps). FOR BLOOD PRESSURE 12/30/14  Yes [name], MD  insulin glargine (LANTUS) 100 unit/mL SOPN Inject 2-3 Units into the skin at bedtime as needed (CBG >120). CBG 120-140 2 units, 140-160 3 units   Yes Historical Provider, MD  terazosin (HYTRIN) 10 MG capsule Take 10 mg by mouth daily with breakfast. 08/15/11  Yes [name], MD  testosterone cypionate (DEPOTESTOTERONE CYPIONATE) 200  MG/ML injection Inject 43ml intramuscularly as directed twice a month Patient taking differently: Inject 200 mg into the muscle every 14 (fourteen) days. Last injection March 16 or 17, 2016 05/31/14  Yes Tiffany L Reed, DO  traMADol (ULTRAM) 50 MG tablet Take 50-100 mg by mouth every 4 (four) hours as needed (pain).  11/10/14  Yes Historical Provider, MD  zolpidem (AMBIEN) 10 MG tablet Take 10 mg by mouth at bedtime as needed for sleep.  05/30/14  Yes Kelvin Cellar, MD  amLODipine (NORVASC) 10 MG tablet Take 1 tablet (10 mg total) by mouth daily. 03/26/15   Kinnie Feil, MD  fentaNYL (DURAGESIC - DOSED MCG/HR) 50 MCG/HR Place 1 patch (50  mcg total) onto the skin every 3 (three) days. 03/26/15   Kinnie Feil, MD  hydrALAZINE (APRESOLINE) 25 MG tablet Take 25 mg by mouth 2 (two) times daily. 03/09/15   Historical Provider, MD  isosorbide dinitrate (ISORDIL) 20 MG tablet Take 20 mg by mouth 2 (two) times daily. 03/09/15   Historical Provider, MD  metoprolol tartrate (LOPRESSOR) 25 MG tablet Take 1 tablet (25 mg total) by mouth 2 (two) times daily. 03/26/15   Kinnie Feil, MD  [facility] VERIO test strip 2 (two) times daily. use for testing 02/04/15   Historical Provider, MD  pantoprazole (PROTONIX) 40 MG tablet Take 1 tablet (40 mg total) by mouth 2 (two) times daily. 03/26/15   Kinnie Feil, MD  polyethylene glycol (MIRALAX / GLYCOLAX) packet Take 17 g by mouth daily as needed for mild constipation. 03/26/15   Kinnie Feil, MD  senna-docusate (SENOKOT-S) 8.6-50 MG per tablet Take 2 tablets by mouth 2 (two) times daily between meals as needed for mild constipation. 03/26/15   Kinnie Feil, MD   BP 189/57 mmHg  Pulse 60  Temp(Src) 98 F (36.7 C) (Oral)  Resp 18  Ht 6' (1.829 m)  Wt 275 lb 3.2 oz (124.83 kg)  BMI 37.32 kg/m2  SpO2 96% Physical Exam  Constitutional: He appears well-developed.  HENT:  Head: Atraumatic.  Neck: Neck supple.  Cardiovascular:  Bradycardia  Pulmonary/Chest:  Effort normal and breath sounds normal. He exhibits tenderness.  Tenderness to right lateral chest wall without crepitance or deformity. Slight ecchymosis to left anterior abdomen.  Abdominal: Soft. There is tenderness.  Musculoskeletal: Normal range of motion.  Neurological: He is alert.  Skin: Skin is warm.    ED Course  Procedures (including critical care time) Labs Review Labs Reviewed  CBC WITH DIFFERENTIAL/PLATELET - Abnormal; Notable for the following:    RBC 3.62 (*)    Hemoglobin 10.6 (*)    HCT 31.6 (*)    Platelets 143 (*)    Neutrophils Relative % 82 (*)    Lymphocytes Relative 7 (*)    Lymphs Abs 0.3 (*)    All other components within normal limits  COMPREHENSIVE METABOLIC PANEL - Abnormal; Notable for the following:    Sodium 124 (*)    Potassium 6.3 (*)    CO2 11 (*)    Glucose, Bld 129 (*)    BUN 121 (*)    Creatinine, Ser 7.62 (*)    GFR calc non Af Amer 6 (*)    GFR calc Af Amer 7 (*)    Anion gap 17 (*)    All other components within normal limits  URINALYSIS, ROUTINE W REFLEX MICROSCOPIC - Abnormal; Notable for the following:    APPearance CLOUDY (*)    Protein, ur 30 (*)    All other components within normal limits  CBC - Abnormal; Notable for the following:    RBC 3.63 (*)    Hemoglobin 10.7 (*)    HCT 31.4 (*)    Platelets 139 (*)    All other components within normal limits  PROTIME-INR - Abnormal; Notable for the following:    Prothrombin Time 17.7 (*)    All other components within normal limits  BASIC METABOLIC PANEL - Abnormal; Notable for the following:    Sodium 129 (*)    Potassium 5.6 (*)    CO2 17 (*)    Glucose, Bld 117 (*)    BUN 114 (*)    Creatinine, Ser  6.89 (*)    GFR calc non Af Amer 7 (*)    GFR calc Af Amer 8 (*)    All other components within normal limits  HEMOGLOBIN AND HEMATOCRIT, BLOOD - Abnormal; Notable for the following:    Hemoglobin 10.5 (*)    HCT 31.1 (*)    All other components within normal limits  IRON  AND TIBC - Abnormal; Notable for the following:    Iron 32 (*)    Saturation Ratios 10 (*)    All other components within normal limits  URINE MICROSCOPIC-ADD ON - Abnormal; Notable for the following:    Casts HYALINE CASTS (*)    All other components within normal limits  GLUCOSE, CAPILLARY - Abnormal; Notable for the following:    Glucose-Capillary 120 (*)    All other components within normal limits  RENAL FUNCTION PANEL - Abnormal; Notable for the following:    Sodium 133 (*)    Potassium 6.0 (*)    CO2 18 (*)    Glucose, Bld 108 (*)    BUN 104 (*)    Creatinine, Ser 5.80 (*)    Calcium 8.3 (*)    Phosphorus 7.1 (*)    Albumin 3.0 (*)    GFR calc non Af Amer 9 (*)    GFR calc Af Amer 10 (*)    All other components within normal limits  GLUCOSE, CAPILLARY - Abnormal; Notable for the following:    Glucose-Capillary 110 (*)    All other components within normal limits  HEMOGLOBIN A1C - Abnormal; Notable for the following:    Hgb A1c MFr Bld 6.1 (*)    All other components within normal limits  GLUCOSE, CAPILLARY - Abnormal; Notable for the following:    Glucose-Capillary 124 (*)    All other components within normal limits  CBC - Abnormal; Notable for the following:    WBC 3.6 (*)    RBC 3.46 (*)    Hemoglobin 10.2 (*)    HCT 30.2 (*)    All other components within normal limits  RENAL FUNCTION PANEL - Abnormal; Notable for the following:    Sodium 134 (*)    Potassium 5.3 (*)    Glucose, Bld 112 (*)    BUN 89 (*)    Creatinine, Ser 5.01 (*)    Phosphorus 6.0 (*)    Albumin 3.0 (*)    GFR calc non Af Amer 10 (*)    GFR calc Af Amer 12 (*)    All other components within normal limits  GLUCOSE, CAPILLARY - Abnormal; Notable for the following:    Glucose-Capillary 123 (*)    All other components within normal limits  GLUCOSE, CAPILLARY - Abnormal; Notable for the following:    Glucose-Capillary 183 (*)    All other components within normal limits  GLUCOSE,  CAPILLARY - Abnormal; Notable for the following:    Glucose-Capillary 113 (*)    All other components within normal limits  GLUCOSE, CAPILLARY - Abnormal; Notable for the following:    Glucose-Capillary 207 (*)    All other components within normal limits  GLUCOSE, CAPILLARY - Abnormal; Notable for the following:    Glucose-Capillary 158 (*)    All other components within normal limits  CBC - Abnormal; Notable for the following:    RBC 3.54 (*)    Hemoglobin 10.5 (*)    HCT 31.4 (*)    Platelets 147 (*)    All other components within normal limits  BASIC METABOLIC PANEL - Abnormal; Notable for the following:    Glucose, Bld 118 (*)    BUN 67 (*)    Creatinine, Ser 3.57 (*)    GFR calc non Af Amer 15 (*)    GFR calc Af Amer 18 (*)    All other components within normal limits  GLUCOSE, CAPILLARY - Abnormal; Notable for the following:    Glucose-Capillary 147 (*)    All other components within normal limits  GLUCOSE, CAPILLARY - Abnormal; Notable for the following:    Glucose-Capillary 104 (*)    All other components within normal limits  GLUCOSE, CAPILLARY - Abnormal; Notable for the following:    Glucose-Capillary 137 (*)    All other components within normal limits  I-STAT CHEM 8, ED - Abnormal; Notable for the following:    Sodium 123 (*)    Potassium 6.2 (*)    BUN 119 (*)    Creatinine, Ser 8.00 (*)    Glucose, Bld 128 (*)    Calcium, Ion 1.07 (*)    Hemoglobin 12.2 (*)    HCT 36.0 (*)    All other components within normal limits  MRSA PCR SCREENING  FERRITIN  H. PYLORI ANTIBODY, IGG  I-STAT CG4 LACTIC ACID, ED  TYPE AND SCREEN    Imaging Review No results found.   EKG Interpretation   Date/Time:  Wednesday March 23 2015 18:55:27 EDT Ventricular Rate:  32 PR Interval:    QRS Duration: 206 QT Interval:  586 QTC Calculation: 427 R Axis:   -97 Text Interpretation:   Critical Test Result: Arrhythmia Idioventricular  rhythm Right bundle branch block  Abnormal ECG bradycardia.wide QRS.  Confirmed by Alvino Chapel  MD, Ovid Curd [id number]) on 03/23/2015 7:17:00 PM      MDM   Final diagnoses:  Acute on chronic renal failure  Acute hyponatremia  Hyperkalemia  Bradycardia    Patient sent in for renal failure and hyperkalemia. Retrocardiac. Has A. fib and reportedly has a slow rate chronically. With the renal failure and wide QRS was given calcium and bicarbonate. Discuss with nephrology. Will admit to internal medicine. Also discussed with critical care and cardiology. Fluid boluses given. Likely prerenal due to dehydration.  CRITICAL CARE Performed by: Mackie Pai Total critical care time: 45 Critical care time was exclusive of separately billable procedures and treating other patients. Critical care was necessary to treat or prevent imminent or life-threatening deterioration. Critical care was time spent personally by me on the following activities: development of treatment plan with patient and/or surrogate as well as nursing, discussions with consultants, evaluation of patient's response to treatment, examination of patient, obtaining history from patient or surrogate, ordering and performing treatments and interventions, ordering and review of laboratory studies, ordering and review of radiographic studies, pulse oximetry and re-evaluation of patient's condition.      Davonna Belling, MD 03/26/15 [phone]

## 2015-03-23 NOTE — H&P (Addendum)
[facility] INPATIENT HISTORY AND PHYSICAL EXAMINATION NOTE  Patient ID: [name], MD MRN: [id number], DOB/AGE: 06/11/1941   Admit date: 03/23/2015   Primary Physician: [name], MD  Reason for admission: acute on chronic kidney injury, electrolyte abnormalities, GI bleeding  CC: generalized weakness  HPI: This is a 74 y.o. male with known history of CAD s/p 3 v CABG 2006, AS s/p AVR with bioprosthetic valve in 2012, atrial fibrillation (on anticoagulation with reduced dose xarelto), OA, iDDM, HTN, CKD stage 3-4, recent right rib fracture due to mechanical fall  who presented today with chief complaint of generalized weakness and significant bradycardia. Patient was in usual state of health until yesterday when patient developed generalized weakness. Today he was also feeling nauseated. He came to the ED where he was found to have elevated creatinine of 8 and HR in 20-30s with wide QRS which is his baseline. He was seen by nephrologist who recommended conservative management with IV bicarb, calcium and IV fluids. Patient otherwise is feeling well. He is a retired Administrator, [employer] and is fairly active at home. He is compliant with his medications. However since his recent fall and rib fracture he has not been eating well due to pain. He has been taking his pain medications regularly but he has reduced the dose of dilaudid to 2 mg Q4 hours which he said that is helping him. He is also taking tramadol but it makes him stay awake longer. He takes zolpidem for insomnia.  Later during the stay in the ED, he had sudden onset of nausea and had about 20 ml of dark coffee ground emesis. He has been on xarelto 15 mg lately for his atrial fibrillation. Cardiology was consulted for HR.   ASSESSMENT AND PLAN:    Problem List:  Principal Problem:   Acute on chronic renal failure Likely secondary to dehydration and poor oral intake May have progressed to ATN BMP more  frequently abdmit to step down unit Avoid worsening hyponatremia IV bicarb x 1L ordered Nephrology consulted for evaluation and appreciate their recommendations  Active Problems:   Acute hyponatremia Manage as above IV NS will likely help to improve Na If not, then will need dialysis    Hyperkalemia Temporized with calcium, IV bicarb and insul Strict I/Os Foley placed Daily weights IF CONTINUES TO BE HYPERKALEMIC, PLEASE CALL NEPHROLOGY FOR URGENT DIALYSIS Signed out to the oncall physician Dr. Posey Pronto and the mid level.     Atrial fibrillation with controlled ventricular response On xarelto Holding xarelto due to renal failure    Bradycardia, sinus, persistent, severe Cardiology consulted for evaluation for tachy brady syndrome and need for possible PPM Recommended by them to observe on tele Outpatient follow up with Dr. Mare Ferrari      Rib pain on right side Pain control with IV dilaudid Holding off the tramadol    Poor appetite dietician consult  Diabetes mellitus  SSI, finger sticks No oral hypoglycemics HbA1c     Acute gastrointestinal bleeding Likely upper GI bleeding h and h q 6 hours IV Protonix drip started Liquid diet If continues to drop, will need GI consult in the AM for possible EGD Checking iron profile Checking h pylori    Nausea & vomiting zofran prn  Past Medical History  Diagnosis Date  . Coronary heart disease     stent, CABG, RBBB  . Valvular heart disease     aortic stenosis/regurgitation  . Sarcoid 2011    pulmonary and  bone marrow  . Hypercalcemia 2011    due to sarcoidiosis  . Bone marrow disease   . Diabetes mellitus   . Hemorrhagic cystitis 2012  . Gout   . HTN (hypertension)   . A-fib     controlled with medication  . Myocardial infarction     1995, 2002, 2006  . OSA (obstructive sleep apnea)     use bipap setting of 10 and 12  . Complication of anesthesia 11-16-13    required alot of versed per anesthesia with  cataract surgery  . Chronic kidney disease (CKD), stage III (moderate)     lov note dr Koleen Nimrod nephrology 09-17-13 on chart  . Heart murmur   . Atrial flutter     hx of  . CHF (congestive heart failure)   . Pneumonia 1966, 2011    hx of  . Anxiety   . Osteoarthritis   . DDD (degenerative disc disease)   . Synovial cyst of lumbar spine     l3-l4, l4-l5, injections  . Anemia   . Hepatitis     mono  . Cancer     basal cell  . Right sciatic nerve pain     for block thursday 01-21-2014, injections  . Fungal toenail infection   . Biceps tendon tear     right  . Diabetes     Past Surgical History  Procedure Laterality Date  . Cataract surgery Right 2007  . Prostate surgery  oct. 2012    TURP  . Vasectomy  1977  . Redo median sternotomy, extracorporeal cirulation, avr, tricuspid valve repair  06/13/2011    AVR(23-mm Edwards pericardial Magna-Ease valve./ TVrepair (34-mm Edwards MC3 annuloplasty ring  . Coronary artery bypass graft  2006    x 5  . Cataract surgery Left 11-16-13  . Cardioversion N/A 12/30/2013    Procedure: CARDIOVERSION;  Surgeon: [name], MD;  Location: [facility] ENDOSCOPY;  Service: Cardiovascular;  Laterality: N/A;  10:49 cardioversion at 120 joules, then 150 joules, to SB  used  Lido 30mg ,  Propofol 160 mcg  . Colonoscopy N/A 01/29/2014    Procedure: COLONOSCOPY;  Surgeon: Winfield Cunas., MD;  Location: [facility];  Service: Endoscopy;  Laterality: N/A;  amanda//ja  . Cardioversion  2003, 2006, 2012, 2013  . Portacath placement    . Portacath removed    . Basal cell carcinoma excision Right 2015    ear  . Total knee arthroplasty Right 05/24/2014    Procedure: RIGHT TOTAL KNEE ARTHROPLASTY;  Surgeon: Gearlean Alf, MD;  Location: [facility] ORS;  Service: Orthopedics;  Laterality: Right;     Allergies:  Allergies  Allergen Reactions  . Actos [Pioglitazone Hydrochloride] Swelling and Other (See Comments)    Severe peripheral edema  . Penicillins Swelling  and Other (See Comments)    Serum sickness  . Amiodarone Other (See Comments)    diarrhea  . Latex Swelling  . Codeine Other (See Comments)    Makes Pt aggressive  . Depakote [Divalproex Sodium] Diarrhea    severe  . Januvia [Sitagliptin] Diarrhea and Other (See Comments)    bradycardia  . Loop Diuretics Other (See Comments)    Spike in BUN levels  . Losartan Other (See Comments)    aphysia  . Nsaids Other (See Comments)    Renal problems  . Onglyza [Saxagliptin] Diarrhea and Other (See Comments)    bradycardia  . Orudis [Ketoprofen] Other (See Comments)    Cannot take due to renal insufficiency  .  Pentazocine Lactate Other (See Comments)    Unknown allergic reaction - pt and wife do not recall this  . Rozerem [Ramelteon] Diarrhea    Severe   . Sertraline Hcl Other (See Comments)    Pt and wife do not recall this  . Benazepril Hcl Other (See Comments)    ? Possible lowers plateletes  . Indomethacin Other (See Comments)    Renal problems   . Minocycline Other (See Comments)    Makes dizzy and feel just lousy.  . Poison Ivy Extract [Extract Of Poison Ivy] Rash and Other (See Comments)    blisters  . Potassium Iodide Itching and Rash  . Shellfish Allergy Rash     Home Medications Current Facility-Administered Medications  Medication Dose Route Frequency Provider Last Rate Last Dose  . 0.9 %  sodium chloride infusion   Intravenous Continuous Flossie Dibble, MD      . 0.9 %  sodium chloride infusion  250 mL Intravenous PRN Flossie Dibble, MD      . acetaminophen (TYLENOL) tablet 1,000 mg  1,000 mg Oral Q6H PRN Flossie Dibble, MD      . amLODipine (NORVASC) tablet 10 mg  10 mg Oral Daily Flossie Dibble, MD      . aspirin EC tablet 81 mg  81 mg Oral QHS Flossie Dibble, MD      . diazepam (VALIUM) tablet 10 mg  10 mg Oral PRN Flossie Dibble, MD      . HYDROmorphone (DILAUDID) injection 1 mg  1 mg Intravenous Q4H PRN Flossie Dibble, MD      . HYDROmorphone (DILAUDID)  tablet 2 mg  2 mg Oral Q4H PRN Flossie Dibble, MD      . insulin aspart (novoLOG) injection 0-5 Units  0-5 Units Subcutaneous QHS Flossie Dibble, MD      . insulin aspart (novoLOG) injection 0-9 Units  0-9 Units Subcutaneous TID WC Flossie Dibble, MD      . insulin glargine (LANTUS) injection 2-3 Units  2-3 Units Subcutaneous QHS PRN Flossie Dibble, MD      . pantoprazole (PROTONIX) 80 mg in sodium chloride 0.9 % 100 mL IVPB  80 mg Intravenous Once Flossie Dibble, MD      . pantoprazole (PROTONIX) 80 mg in sodium chloride 0.9 % 250 mL (0.32 mg/mL) infusion  8 mg/hr Intravenous Continuous Flossie Dibble, MD      . Derrill Memo ON 03/27/2015] pantoprazole (PROTONIX) injection 40 mg  40 mg Intravenous Q12H Flossie Dibble, MD      . polyethylene glycol (MIRALAX / GLYCOLAX) packet 17 g  17 g Oral Daily PRN Flossie Dibble, MD      . sodium bicarbonate 150 mEq in dextrose 5 % 1,000 mL infusion   Intravenous Continuous Dwana Melena, MD 50 mL/hr at 03/23/15 2038    . sodium chloride 0.9 % injection 3 mL  3 mL Intravenous Q12H Flossie Dibble, MD      . sodium chloride 0.9 % injection 3 mL  3 mL Intravenous PRN Flossie Dibble, MD      . terazosin (HYTRIN) capsule 10 mg  10 mg Oral Q breakfast Flossie Dibble, MD      . traMADol Veatrice Bourbon) tablet 50-100 mg  50-100 mg Oral Q4H PRN Flossie Dibble, MD      . zolpidem (AMBIEN) tablet 5 mg  5 mg Oral QHS PRN Flossie Dibble, MD  Family History  Problem Relation Age of Onset  . Heart attack Father   . Allergies    . Asthma    . Heart disease    . Cancer       History   Social History  . Marital Status: Married    Spouse Name: N/A  . Number of Children: 4  . Years of Education: N/A   Occupational History  . retired Administrator, [employer]     due to coronary disease in 2000  . 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 Smoking status: Former Smoker -- 28 years    Types: Pipe, Cigars    Quit date: 12/25/1995  .  Smokeless tobacco: Never Used  . Alcohol Use: Yes     Comment: 3-5 beer or wine per week  . Drug Use: No  . Sexual Activity: Not on file   Other Topics Concern  . Not on file   Social History Narrative     Review of Systems: General: fatigue negative for chills, fever, night sweats or weight changes.  Cardiovascular: negative for dyspnea on exertion, edema, orthopnea, palpitations, paroxysmal nocturnal dyspnea or shortness of breath  Dermatological: negative for rash Respiratory: negative for cough or wheezing Urologic: negative for hematuria Abdominal: nausea and vomiting, hematemesis negative for diarrhea, bright red blood per rectum, melena, or hematemesis Neurologic: falls negative for visual changes, syncope, or dizziness  Physical Exam: Vitals: BP 179/46 mmHg  Pulse 59  Temp(Src) 97.7 F (36.5 C) (Oral)  Resp 16  Ht 6' (1.829 m)  Wt 113.399 kg (250 lb)  BMI 33.90 kg/m2  SpO2 95% General: not in acute distress Neck: JVP flat, neck supple Heart: irregular rate, bradycardiac, S1, S2, systolic grade II/VI murmur at the [facility] Lungs: CTAB  GI: non tender, non distended, bowel sounds present Extremities: no edema Neuro: AAO x 3  Psych: normal affect, no anxiety   Labs:   Results for orders placed or performed during the hospital encounter of 03/23/15 (from the past 24 hour(s))  CBC with Differential     Status: Abnormal   Collection Time: 03/23/15  7:43 PM  Result Value Ref Range   WBC 4.7 4.0 - 10.5 K/uL   RBC 3.62 (L) 4.22 - 5.81 MIL/uL   Hemoglobin 10.6 (L) 13.0 - 17.0 g/dL   HCT 31.6 (L) 39.0 - 52.0 %   MCV 87.3 78.0 - 100.0 fL   MCH 29.3 26.0 - 34.0 pg   MCHC 33.5 30.0 - 36.0 g/dL   RDW 14.8 11.5 - 15.5 %   Platelets 143 (L) 150 - 400 K/uL   Neutrophils Relative % 82 (H) 43 - 77 %   Neutro Abs 3.8 1.7 - 7.7 K/uL   Lymphocytes Relative 7 (L) 12 - 46 %   Lymphs Abs 0.3 (L) 0.7 - 4.0 K/uL   Monocytes Relative 9 3 - 12 %   Monocytes Absolute 0.4 0.1 - 1.0  K/uL   Eosinophils Relative 2 0 - 5 %   Eosinophils Absolute 0.1 0.0 - 0.7 K/uL   Basophils Relative 0 0 - 1 %   Basophils Absolute 0.0 0.0 - 0.1 K/uL  Comprehensive metabolic panel     Status: Abnormal   Collection Time: 03/23/15  7:43 PM  Result Value Ref Range   Sodium 124 (L) 135 - 145 mmol/L   Potassium 6.3 (HH) 3.5 - 5.1 mmol/L   Chloride 96 96 - 112 mmol/L   CO2  11 (L) 19 - 32 mmol/L   Glucose, Bld 129 (H) 70 - 99 mg/dL   BUN 121 (H) 6 - 23 mg/dL   Creatinine, Ser 7.62 (H) 0.50 - 1.35 mg/dL   Calcium 8.8 8.4 - 10.5 mg/dL   Total Protein 6.5 6.0 - 8.3 g/dL   Albumin 3.6 3.5 - 5.2 g/dL   AST 16 0 - 37 U/L   ALT 11 0 - 53 U/L   Alkaline Phosphatase 73 39 - 117 U/L   Total Bilirubin 0.6 0.3 - 1.2 mg/dL   GFR calc non Af Amer 6 (L) >90 mL/min   GFR calc Af Amer 7 (L) >90 mL/min   Anion gap 17 (H) 5 - 15  I-stat Chem 8, ED     Status: Abnormal   Collection Time: 03/23/15  7:43 PM  Result Value Ref Range   Sodium 123 (L) 135 - 145 mmol/L   Potassium 6.2 (HH) 3.5 - 5.1 mmol/L   Chloride 100 96 - 112 mmol/L   BUN 119 (H) 6 - 23 mg/dL   Creatinine, Ser 8.00 (H) 0.50 - 1.35 mg/dL   Glucose, Bld 128 (H) 70 - 99 mg/dL   Calcium, Ion 1.07 (L) 1.13 - 1.30 mmol/L   TCO2 11 0 - 100 mmol/L   Hemoglobin 12.2 (L) 13.0 - 17.0 g/dL   HCT 36.0 (L) 39.0 - 52.0 %  I-Stat CG4 Lactic Acid, ED     Status: None   Collection Time: 03/23/15  7:44 PM  Result Value Ref Range   Lactic Acid, Venous 1.54 0.5 - 2.0 mmol/L     Radiology/Studies: No results found.  EKG: atrial fibrillation with RBBB very slow response  Medical decision making:  Discussed care with the patient Discussed care with the physician on the phone Reviewed labs and imaging personally Reviewed prior records   Signed, Flossie Dibble, MD MS 03/24/2015, 1:17 AM

## 2015-03-23 NOTE — Consult Note (Signed)
Reason for Consult: Renal failure Referring Physician: Dr. Alvino Chapel  Chief Complaint: abnormal labs  Assessment: 1. Acute Kidney Injury - likely multifactorial from ischemic ATN secondary to hypotension + lisinopril + volume depletion. Also possible that this is on the other end of the spectrum with prerenal azotemia. Less likely to be obstructive given no history of obstruction + h/o TURP. Hyperkalemia resulting from the AKI with no history of decreased UOP or dyspnea. 2. Hypotension - volume depletion by history with no signs of infection or cardiac event - recently started on BiDil and had been on lisinopril. 3. Diabetes 4. H/o aortic valve replacement + CASHD. 5. H/o Sarcoid  Plan 1.   Challenge with NS which will also increase distal delivery for Na/K exchange; need to keep a close eye on him to evaluate for dyspnea and acute volume overload. 2.   He should be able to avoid dialysis but if he has consistent EKG changes he may temporarily benefit from HD. The bradycardic episodes are worrisome but he has had a history of this even at his baseline renal function. 3.   D5W + 3 amps HCO3 at 89ml/hr x1 L. 4.   Hold the Lasix and BiDil for now. 5. PVR bladder scan to be complete.   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 [name] for CKD with a baseline creatinine of 2-2.2 recently started on BiDil. He had a fall a week ago sustaining a right rib franture as well as injury to his left shoulder. He has been taking pain meds and sleeping a lot at home with very poor PO intake (solids and liquids); he denies nausea and states that he has just not felt like eating. He denies any dizziness or syncope or chest pain. He also denies dyspnea, cough , fever or chills. He has had leg swelling chronically and doesn't think it has worsened. He also denies diarrhea and actually has a small degree of constipation. He denies obstructive like symptoms, hematuria,  dysuria  or flank pain. He was found to have AKI + hyperkalemia on office f/u labs and told to report to the ED. In the ED he has had a few runs of bradycardia and was given calcium gluconate + the usual cocktail. Overall patient feels well.  Review of Systems  Constitutional: Negative for fever, chills and weight loss.  HENT: Negative for nosebleeds and sore throat.   Eyes: Negative for blurred vision and double vision.  Respiratory: Negative for cough and shortness of breath.   Cardiovascular: Positive for leg swelling. Negative for chest pain, palpitations and orthopnea.  Gastrointestinal: Positive for constipation. Negative for nausea, vomiting, abdominal pain and diarrhea.  Genitourinary: Negative for dysuria, frequency, hematuria and flank pain.  Musculoskeletal: Positive for joint pain and falls. Negative for neck pain.  Skin: Negative for rash.  Neurological: Positive for weakness. Negative for dizziness, seizures, loss of consciousness and headaches.  Endo/Heme/Allergies: Negative for polydipsia. Does not bruise/bleed easily.  Psychiatric/Behavioral: Negative for depression and suicidal ideas.   Pertinent items are noted in HPI.  Chemistry and CBC: CREATININE, SER  Date/Time Value Ref Range Status  03/23/2015 07:43 PM 8.00* 0.50 - 1.35 mg/dL Final  05/30/2014 04:56 AM 2.06* 0.50 - 1.35 mg/dL Final  05/29/2014 05:00 AM 2.41* 0.50 - 1.35 mg/dL Final  05/28/2014 01:40 AM 3.27* 0.50 - 1.35 mg/dL Final  05/27/2014 12:15 AM 3.40* 0.50 - 1.35 mg/dL Final  05/26/2014 05:19 AM 2.33*  0.50 - 1.35 mg/dL Final  05/25/2014 04:36 AM 2.11* 0.50 - 1.35 mg/dL Final  05/18/2014 03:00 PM 2.37* 0.50 - 1.35 mg/dL Final  12/28/2013 03:48 PM 2.3* 0.4 - 1.5 mg/dL Final  09/25/2011 04:25 PM 1.77* 0.50 - 1.35 mg/dL Final  07/31/2011 10:56 AM 1.4 0.4 - 1.5 mg/dL Final  06/22/2011 05:15 AM 1.52* 0.50 - 1.35 mg/dL Final    Comment:    **Please note change in reference range.**  06/21/2011 05:05 AM  1.82* 0.50 - 1.35 mg/dL Final    Comment:    **Please note change in reference range.**  06/20/2011 03:50 AM 2.11* 0.50 - 1.35 mg/dL Final    Comment:    **Please note change in reference range.**  06/19/2011 06:00 PM 2.29* 0.50 - 1.35 mg/dL Final    Comment:    **Please note change in reference range.**  06/19/2011 11:50 AM 2.28* 0.50 - 1.35 mg/dL Final    Comment:    **Please note change in reference range.**  06/19/2011 03:30 AM 2.40* 0.50 - 1.35 mg/dL Final    Comment:    **Please note change in reference range.**  06/18/2011 03:45 AM 2.44* 0.50 - 1.35 mg/dL Final    Comment:    **Please note change in reference range.**  06/17/2011 03:59 AM 2.40* 0.50 - 1.35 mg/dL Final    Comment:    **Please note change in reference range.**  06/16/2011 03:00 AM 2.64* 0.50 - 1.35 mg/dL Final    Comment:    **Please note change in reference range.**  06/15/2011 03:41 AM 2.71* 0.50 - 1.35 mg/dL Final    Comment:    **Please note change in reference range.**  06/14/2011 05:04 PM 2.90* 0.50 - 1.35 mg/dL Final  06/14/2011 05:00 PM 2.46* 0.50 - 1.35 mg/dL Final    Comment:    **Please note change in reference range.**  06/14/2011 04:15 AM 2.50* 0.50 - 1.35 mg/dL Final    Comment:    **Please note change in reference range.**  06/13/2011 10:45 PM 2.80* 0.50 - 1.35 mg/dL Final  06/13/2011 10:40 PM 2.49* 0.50 - 1.35 mg/dL Final    Comment:    **Please note change in reference range.**  06/11/2011 10:02 AM 2.61* 0.50 - 1.35 mg/dL Final    Comment:    **Please note change in reference range.**  05/30/2011 06:55 AM 2.05* 0.4 - 1.5 mg/dL Final  05/29/2011 05:30 AM 2.14* 0.4 - 1.5 mg/dL Final  05/28/2011 12:10 PM 2.50* 0.4 - 1.5 mg/dL Final  05/23/2011 02:25 PM 2.2* 0.4 - 1.5 mg/dL Final  05/09/2011 11:22 AM 2.30* 0.4 - 1.5 mg/dL Final  05/04/2011 01:18 PM 1.8* 0.4 - 1.5 mg/dL Final  12/02/2007 09:35 AM 1.14 0.40 - 1.50 mg/dL Final  11/28/2007 03:55 AM 1.48  Final  11/27/2007 04:16 AM 1.51*   Final  11/26/2007 03:21 PM 1.43  Final  11/26/2007 03:35 AM 1.52*  Final  11/25/2007 05:46 PM 1.58*  Final  11/24/2007 09:40 AM 1.92*  Final  11/19/2007 10:15 AM 1.19 0.40 - 1.50 mg/dL Final  11/11/2007 01:51 PM 1.16 0.40 - 1.50 mg/dL Final    Recent Labs Lab 03/23/15 1943  NA 123*  K 6.2*  CL 100  GLUCOSE 128*  BUN 119*  CREATININE 8.00*    Recent Labs Lab 03/23/15 1943  WBC 4.7  NEUTROABS 3.8  HGB 12.2*  10.6*  HCT 36.0*  31.6*  MCV 87.3  PLT 143*   Liver Function Tests: No results for input(s): AST, ALT, ALKPHOS, BILITOT,  PROT, ALBUMIN in the last 168 hours. No results for input(s): LIPASE, AMYLASE in the last 168 hours. No results for input(s): AMMONIA in the last 168 hours. Cardiac Enzymes: No results for input(s): CKTOTAL, CKMB, CKMBINDEX, TROPONINI in the last 168 hours. Iron Studies: No results for input(s): IRON, TIBC, TRANSFERRIN, FERRITIN in the last 72 hours. PT/INR: @LABRCNTIP (inr:5)  Xrays/Other Studies: ) Results for orders placed or performed during the hospital encounter of 03/23/15 (from the past 48 hour(s))  CBC with Differential     Status: Abnormal   Collection Time: 03/23/15  7:43 PM  Result Value Ref Range   WBC 4.7 4.0 - 10.5 K/uL   RBC 3.62 (L) 4.22 - 5.81 MIL/uL   Hemoglobin 10.6 (L) 13.0 - 17.0 g/dL   HCT 31.6 (L) 39.0 - 52.0 %   MCV 87.3 78.0 - 100.0 fL   MCH 29.3 26.0 - 34.0 pg   MCHC 33.5 30.0 - 36.0 g/dL   RDW 14.8 11.5 - 15.5 %   Platelets 143 (L) 150 - 400 K/uL   Neutrophils Relative % 82 (H) 43 - 77 %   Neutro Abs 3.8 1.7 - 7.7 K/uL   Lymphocytes Relative 7 (L) 12 - 46 %   Lymphs Abs 0.3 (L) 0.7 - 4.0 K/uL   Monocytes Relative 9 3 - 12 %   Monocytes Absolute 0.4 0.1 - 1.0 K/uL   Eosinophils Relative 2 0 - 5 %   Eosinophils Absolute 0.1 0.0 - 0.7 K/uL   Basophils Relative 0 0 - 1 %   Basophils Absolute 0.0 0.0 - 0.1 K/uL  I-stat Chem 8, ED     Status: Abnormal   Collection Time: 03/23/15  7:43 PM  Result Value Ref  Range   Sodium 123 (L) 135 - 145 mmol/L   Potassium 6.2 (HH) 3.5 - 5.1 mmol/L   Chloride 100 96 - 112 mmol/L   BUN 119 (H) 6 - 23 mg/dL   Creatinine, Ser 8.00 (H) 0.50 - 1.35 mg/dL   Glucose, Bld 128 (H) 70 - 99 mg/dL   Calcium, Ion 1.07 (L) 1.13 - 1.30 mmol/L   TCO2 11 0 - 100 mmol/L   Hemoglobin 12.2 (L) 13.0 - 17.0 g/dL   HCT 36.0 (L) 39.0 - 52.0 %   No results found.  PMH:   Past Medical History  Diagnosis Date  . Coronary heart disease     stent, CABG, RBBB  . Valvular heart disease     aortic stenosis/regurgitation  . Sarcoid 2011    pulmonary and bone marrow  . Hypercalcemia 2011    due to sarcoidiosis  . Bone marrow disease   . Diabetes mellitus   . Hemorrhagic cystitis 2012  . Gout   . HTN (hypertension)   . A-fib     controlled with medication  . Myocardial infarction     1995, 2002, 2006  . OSA (obstructive sleep apnea)     use bipap setting of 10 and 12  . Complication of anesthesia 11-16-13    required alot of versed per anesthesia with cataract surgery  . Chronic kidney disease (CKD), stage III (moderate)     lov note dr Koleen Nimrod nephrology 09-17-13 on chart  . Heart murmur   . Atrial flutter     hx of  . CHF (congestive heart failure)   . Pneumonia 1966, 2011    hx of  . Anxiety   . Osteoarthritis   . DDD (degenerative disc disease)   .  Synovial cyst of lumbar spine     l3-l4, l4-l5, injections  . Anemia   . Hepatitis     mono  . Cancer     basal cell  . Right sciatic nerve pain     for block thursday 01-21-2014, injections  . Fungal toenail infection   . Biceps tendon tear     right  . Diabetes     PSH:   Past Surgical History  Procedure Laterality Date  . Cataract surgery Right 2007  . Prostate surgery  oct. 2012    TURP  . Vasectomy  1977  . Redo median sternotomy, extracorporeal cirulation, avr, tricuspid valve repair  06/13/2011    AVR(23-mm Edwards pericardial Magna-Ease valve./ TVrepair (34-mm Edwards MC3 annuloplasty ring   . Coronary artery bypass graft  2006    x 5  . Cataract surgery Left 11-16-13  . Cardioversion N/A 12/30/2013    Procedure: CARDIOVERSION;  Surgeon: [name], MD;  Location: [facility] ENDOSCOPY;  Service: Cardiovascular;  Laterality: N/A;  10:49 cardioversion at 120 joules, then 150 joules, to SB  used  Lido 30mg ,  Propofol 160 mcg  . Colonoscopy N/A 01/29/2014    Procedure: COLONOSCOPY;  Surgeon: Winfield Cunas., MD;  Location: [facility] ENDOSCOPY;  Service: Endoscopy;  Laterality: N/A;  amanda//ja  . Cardioversion  2003, 2006, 2012, 2013  . Portacath placement    . Portacath removed    . Basal cell carcinoma excision Right 2015    ear  . Total knee arthroplasty Right 05/24/2014    Procedure: RIGHT TOTAL KNEE ARTHROPLASTY;  Surgeon: Gearlean Alf, MD;  Location: [facility] ORS;  Service: Orthopedics;  Laterality: Right;    Allergies:  Allergies  Allergen Reactions  . Actos [Pioglitazone Hydrochloride] Swelling  . Hydralazine Hcl Diarrhea    Bradycardia, SOB   . Penicillins Other (See Comments)    Serum sickness, swelling  . Amiodarone Other (See Comments)    diarrhea  . Latex Swelling  . Codeine     Makes Pt aggressive  . Depakote [Divalproex Sodium] Diarrhea    severe  . Hydralazine     Diarrhea and weakness  . Loop Diuretics     Spike in BUN levels  . Losartan Other (See Comments)  . Nsaids     Renal problems  . Orudis [Ketoprofen] Other (See Comments)    Cannot take due to renal insufficiency  . Pentazocine Lactate   . Poison Ivy Extract [employer] Poison Ivy]   . Rozerem [Ramelteon] Diarrhea    Severe   . Sertraline Hcl     Pt not sure  . Benazepril Hcl     ? Possible lowers plateletes  . Indomethacin Other (See Comments)    Renal problems   . Minocycline Other (See Comments)    Makes dizzy and feel just lousy.  . Potassium Iodide Rash    Itching   . Shellfish Allergy Rash    Medications:   Prior to Admission medications   Medication Sig Start Date End Date  Taking? Authorizing Provider  acetaminophen (TYLENOL) 500 MG tablet Take 1,000 mg by mouth every 6 (six) hours as needed for moderate pain.    Historical Provider, MD  allopurinol (ZYLOPRIM) 100 MG tablet Take 200 mg by mouth daily with breakfast.  08/06/11   [name], MD  amLODipine (NORVASC) 10 MG tablet TAKE 1 TABLET BY MOUTH EVERY DAY 12/31/14   [name], MD  aspirin EC 81 MG tablet Take 81 mg by  mouth at bedtime.     Historical Provider, MD  CALCIUM-MAGNESIUM-VITAMIN D ER PO Take 1 tablet by mouth daily.     Historical Provider, MD  diazepam (VALIUM) 10 MG tablet Take 1 tablet (10 mg total) by mouth at bedtime. FOR BLOOD PRESSURE Patient taking differently: Take 10 mg by mouth as needed (leg cramps). FOR BLOOD PRESSURE 12/30/14   [name], MD  ethacrynic acid (EDECRIN) 25 MG tablet Take 12.5 mg by mouth daily.    Historical Provider, MD  HYDROmorphone (DILAUDID) 4 MG tablet Take one tablet by mouth every 4 hours as needed for pain; Take 1/2 tablet by mouth as needed if 4mg  not effective 05/31/14   Tiffany L Reed, DO  LANTUS SOLOSTAR 100 UNIT/ML Solostar Pen Inject 100 Units into the skin daily at 10 pm.  02/24/15   Historical Provider, MD  lisinopril (PRINIVIL,ZESTRIL) 40 MG tablet Take 40 mg by mouth daily with breakfast.    Historical Provider, MD  metoprolol tartrate (LOPRESSOR) 25 MG tablet TAKE 1 TABLET BY MOUTH TWICE A DAY 11/09/14   [name], MD  [facility] VERIO test strip 2 (two) times daily. use for testing 02/04/15   Historical Provider, MD  PRESCRIPTION MEDICATION Apply 1 drop topically at bedtime. DMSO with Lamisil    Historical Provider, MD  terazosin (HYTRIN) 10 MG capsule Take 10 mg by mouth daily with breakfast. 08/15/11   [name], MD  testosterone cypionate (DEPOTESTOTERONE CYPIONATE) 200 MG/ML injection Inject 24ml intramuscularly as directed twice a month 05/31/14   Tiffany L Reed, DO  traMADol (ULTRAM) 50 MG tablet Take 50 mg by mouth as needed. for  pain 11/10/14   Historical Provider, MD  XARELTO 15 MG TABS tablet TAKE 1 TABLET BY MOUTH EVERY DAY 01/25/15   [name], MD  zolpidem (AMBIEN) 10 MG tablet Take 10 mg by mouth at bedtime as needed for sleep.  05/30/14   Kelvin Cellar, MD    Discontinued Meds:  There are no discontinued medications.  Social History:  reports that he quit smoking about 19 years ago. His smoking use included Pipe and Cigars. He has never used smokeless tobacco. He reports that he drinks alcohol. He reports that he does not use illicit drugs.  Family History:   Family History  Problem Relation Age of Onset  . Heart attack Father   . Allergies    . Asthma    . Heart disease    . Cancer      Blood pressure 148/36, pulse 29, temperature 97.5 F (36.4 C), resp. rate 13, height 6' (1.829 m), weight 113.399 kg (250 lb), SpO2 94 %. General appearance: alert, cooperative and appears stated age Head: Normocephalic, without obvious abnormality, atraumatic Eyes: negative Throat: lips, mucosa, and tongue normal; teeth and gums normal Neck: no adenopathy, no carotid bruit, no JVD, supple, symmetrical, trachea midline and thyroid not enlarged, symmetric, no tenderness/mass/nodules Back: symmetric, no curvature. ROM normal. No CVA tenderness. Resp: clear to auscultation bilaterally and distant breath sounds Chest wall: right sided chest wall tenderness Cardio: S1, S2 normal GI: soft, non-tender; bowel sounds normal; no masses,  no organomegaly and protuberant Extremities: edema 2+ shins Pulses: 2+ and symmetric Skin: Skin color, texture, turgor normal. No rashes or lesions Lymph nodes: Cervical, supraclavicular, and axillary nodes normal. Neurologic: Alert and oriented X 3, normal strength and tone. Normal symmetric reflexes. Normal coordination and gait       Dwana Melena, MD 03/23/2015, 7:51 PM

## 2015-03-23 NOTE — ED Notes (Signed)
Pt is aware urine is needed for testing, pt is unable to urinate at this time.

## 2015-03-23 NOTE — ED Notes (Signed)
Patient with sudden onset nausea and vomiting. Patient appears to have expressed a large dark red blood clot in emesis. Surrounding emesis appears to be brown in color with foul smell. MD Pickering informed of change. At this time MD cannot advise this RN to give antiemetic with current cardiac variables.

## 2015-03-23 NOTE — ED Notes (Signed)
Pt states that he was sent here for evaluation of elevated kidney function. Pt states that he has been having it monitored routinely however it has become more elevated recently. Pt also has been seen and treated for a fall that injured his left shoulder and rt rib. Pt reports taking tramadol for this. Pt noted to have a heartrate in the 30's. Pt states that he is aware of this and that it occurs when he takes tramadol. Pt in NAD. Alert and oriented at this time.

## 2015-03-24 DIAGNOSIS — R001 Bradycardia, unspecified: Secondary | ICD-10-CM

## 2015-03-24 LAB — TYPE AND SCREEN
ABO/RH(D): O POS
ANTIBODY SCREEN: NEGATIVE

## 2015-03-24 LAB — URINALYSIS, ROUTINE W REFLEX MICROSCOPIC
BILIRUBIN URINE: NEGATIVE
GLUCOSE, UA: NEGATIVE mg/dL
Hgb urine dipstick: NEGATIVE
KETONES UR: NEGATIVE mg/dL
LEUKOCYTES UA: NEGATIVE
Nitrite: NEGATIVE
PROTEIN: 30 mg/dL — AB
Specific Gravity, Urine: 1.01 (ref 1.005–1.030)
Urobilinogen, UA: 0.2 mg/dL (ref 0.0–1.0)
pH: 5 (ref 5.0–8.0)

## 2015-03-24 LAB — BASIC METABOLIC PANEL
ANION GAP: 15 (ref 5–15)
BUN: 114 mg/dL — AB (ref 6–23)
CHLORIDE: 97 mmol/L (ref 96–112)
CO2: 17 mmol/L — ABNORMAL LOW (ref 19–32)
CREATININE: 6.89 mg/dL — AB (ref 0.50–1.35)
Calcium: 8.8 mg/dL (ref 8.4–10.5)
GFR, EST AFRICAN AMERICAN: 8 mL/min — AB (ref >90)
GFR, EST NON AFRICAN AMERICAN: 7 mL/min — AB (ref >90)
Glucose, Bld: 117 mg/dL — ABNORMAL HIGH (ref 70–99)
Potassium: 5.6 mmol/L — ABNORMAL HIGH (ref 3.5–5.1)
Sodium: 129 mmol/L — ABNORMAL LOW (ref 135–145)

## 2015-03-24 LAB — IRON AND TIBC
Iron: 32 ug/dL — ABNORMAL LOW (ref 42–165)
Saturation Ratios: 10 % — ABNORMAL LOW (ref 20–55)
TIBC: 312 ug/dL (ref 215–435)
UIBC: 280 ug/dL (ref 125–400)

## 2015-03-24 LAB — RENAL FUNCTION PANEL
Albumin: 3 g/dL — ABNORMAL LOW (ref 3.5–5.2)
Anion gap: 11 (ref 5–15)
BUN: 104 mg/dL — ABNORMAL HIGH (ref 6–23)
CO2: 18 mmol/L — AB (ref 19–32)
CREATININE: 5.8 mg/dL — AB (ref 0.50–1.35)
Calcium: 8.3 mg/dL — ABNORMAL LOW (ref 8.4–10.5)
Chloride: 104 mmol/L (ref 96–112)
GFR calc Af Amer: 10 mL/min — ABNORMAL LOW (ref >90)
GFR calc non Af Amer: 9 mL/min — ABNORMAL LOW (ref >90)
GLUCOSE: 108 mg/dL — AB (ref 70–99)
Phosphorus: 7.1 mg/dL — ABNORMAL HIGH (ref 2.3–4.6)
Potassium: 6 mmol/L — ABNORMAL HIGH (ref 3.5–5.1)
Sodium: 133 mmol/L — ABNORMAL LOW (ref 135–145)

## 2015-03-24 LAB — PROTIME-INR
INR: 1.44 (ref 0.00–1.49)
Prothrombin Time: 17.7 seconds — ABNORMAL HIGH (ref 11.6–15.2)

## 2015-03-24 LAB — CBC
HEMATOCRIT: 31.4 % — AB (ref 39.0–52.0)
Hemoglobin: 10.7 g/dL — ABNORMAL LOW (ref 13.0–17.0)
MCH: 29.5 pg (ref 26.0–34.0)
MCHC: 34.1 g/dL (ref 30.0–36.0)
MCV: 86.5 fL (ref 78.0–100.0)
Platelets: 139 10*3/uL — ABNORMAL LOW (ref 150–400)
RBC: 3.63 MIL/uL — AB (ref 4.22–5.81)
RDW: 14.7 % (ref 11.5–15.5)
WBC: 4 10*3/uL (ref 4.0–10.5)

## 2015-03-24 LAB — GLUCOSE, CAPILLARY
GLUCOSE-CAPILLARY: 120 mg/dL — AB (ref 70–99)
GLUCOSE-CAPILLARY: 124 mg/dL — AB (ref 70–99)
GLUCOSE-CAPILLARY: 123 mg/dL — AB (ref 70–99)
Glucose-Capillary: 110 mg/dL — ABNORMAL HIGH (ref 70–99)
Glucose-Capillary: 183 mg/dL — ABNORMAL HIGH (ref 70–99)

## 2015-03-24 LAB — URINE MICROSCOPIC-ADD ON

## 2015-03-24 LAB — HEMOGLOBIN AND HEMATOCRIT, BLOOD
HEMATOCRIT: 31.1 % — AB (ref 39.0–52.0)
HEMOGLOBIN: 10.5 g/dL — AB (ref 13.0–17.0)

## 2015-03-24 LAB — FERRITIN: Ferritin: 87 ng/mL (ref 22–322)

## 2015-03-24 LAB — MRSA PCR SCREENING: MRSA by PCR: NEGATIVE

## 2015-03-24 MED ORDER — POLYETHYLENE GLYCOL 3350 17 G PO PACK
17.0000 g | PACK | Freq: Every day | ORAL | Status: DC | PRN
  Filled 2015-03-24: qty 1

## 2015-03-24 MED ORDER — HYDROMORPHONE HCL 1 MG/ML IJ SOLN: 1.0000 mg | INTRAMUSCULAR | Status: DC | PRN

## 2015-03-24 MED ORDER — DIAZEPAM 5 MG PO TABS
10.0000 mg | ORAL_TABLET | ORAL | Status: DC | PRN
  Administered 2015-03-25: 10 mg via ORAL
  Filled 2015-03-24: qty 2

## 2015-03-24 MED ORDER — SODIUM CHLORIDE 0.9 % IJ SOLN: 3.0000 mL | INTRAMUSCULAR | Status: DC | PRN

## 2015-03-24 MED ORDER — SODIUM CHLORIDE 0.9 % IV SOLN
INTRAVENOUS | Status: DC
  Administered 2015-03-24: 02:00:00 via INTRAVENOUS

## 2015-03-24 MED ORDER — ACETAMINOPHEN 500 MG PO TABS
1000.0000 mg | ORAL_TABLET | Freq: Four times a day (QID) | ORAL | Status: DC | PRN
  Administered 2015-03-24 – 2015-03-25 (×3): 1000 mg via ORAL
  Filled 2015-03-24 (×3): qty 2

## 2015-03-24 MED ORDER — SODIUM CHLORIDE 0.9 % IV SOLN: 250.0000 mL | INTRAVENOUS | Status: DC | PRN

## 2015-03-24 MED ORDER — PANTOPRAZOLE SODIUM 40 MG IV SOLR: 40.0000 mg | Freq: Two times a day (BID) | INTRAVENOUS | Status: DC

## 2015-03-24 MED ORDER — HYDROMORPHONE HCL 2 MG PO TABS: 2.0000 mg | ORAL_TABLET | ORAL | Status: DC | PRN

## 2015-03-24 MED ORDER — ASPIRIN EC 81 MG PO TBEC
81.0000 mg | DELAYED_RELEASE_TABLET | Freq: Every day | ORAL | Status: DC
  Administered 2015-03-25: 81 mg via ORAL
  Filled 2015-03-24 (×3): qty 1

## 2015-03-24 MED ORDER — INSULIN GLARGINE 100 UNIT/ML ~~LOC~~ SOLN
2.0000 [IU] | Freq: Every evening | SUBCUTANEOUS | Status: DC | PRN
  Filled 2015-03-24: qty 0.03

## 2015-03-24 MED ORDER — TRAMADOL HCL 50 MG PO TABS
50.0000 mg | ORAL_TABLET | ORAL | Status: DC | PRN
Start: 2015-03-24 — End: 2015-03-26

## 2015-03-24 MED ORDER — SODIUM CHLORIDE 0.9 % IV SOLN
INTRAVENOUS | Status: DC
  Administered 2015-03-24: 18:00:00 via INTRAVENOUS
  Administered 2015-03-24: 75 mL/h via INTRAVENOUS
  Administered 2015-03-25: 06:00:00 via INTRAVENOUS

## 2015-03-24 MED ORDER — ADULT MULTIVITAMIN W/MINERALS CH
1.0000 | ORAL_TABLET | Freq: Every day | ORAL | Status: DC
  Administered 2015-03-25 – 2015-03-26 (×2): 1 via ORAL
  Filled 2015-03-24 (×3): qty 1

## 2015-03-24 MED ORDER — AMLODIPINE BESYLATE 10 MG PO TABS
10.0000 mg | ORAL_TABLET | Freq: Every day | ORAL | Status: DC
  Administered 2015-03-25 – 2015-03-26 (×2): 10 mg via ORAL
  Filled 2015-03-24 (×3): qty 1

## 2015-03-24 MED ORDER — METOPROLOL TARTRATE 25 MG PO TABS
25.0000 mg | ORAL_TABLET | Freq: Two times a day (BID) | ORAL | Status: DC
  Administered 2015-03-25 – 2015-03-26 (×3): 25 mg via ORAL
  Filled 2015-03-24 (×6): qty 1

## 2015-03-24 MED ORDER — HYDRALAZINE HCL 20 MG/ML IJ SOLN
10.0000 mg | Freq: Four times a day (QID) | INTRAMUSCULAR | Status: DC | PRN
  Administered 2015-03-25 – 2015-03-26 (×2): 10 mg via INTRAVENOUS
  Filled 2015-03-24 (×3): qty 1

## 2015-03-24 MED ORDER — HYDROCODONE-ACETAMINOPHEN 5-325 MG PO TABS: 1.0000 | ORAL_TABLET | ORAL | Status: DC | PRN

## 2015-03-24 MED ORDER — SODIUM CHLORIDE 0.9 % IV SOLN: 80.0000 mg | Freq: Once | INTRAVENOUS | Status: DC

## 2015-03-24 MED ORDER — INSULIN ASPART 100 UNIT/ML ~~LOC~~ SOLN
0.0000 [IU] | Freq: Three times a day (TID) | SUBCUTANEOUS | Status: DC
  Administered 2015-03-24 (×2): 1 [IU] via SUBCUTANEOUS
  Administered 2015-03-25: 2 [IU] via SUBCUTANEOUS
  Administered 2015-03-25: 3 [IU] via SUBCUTANEOUS

## 2015-03-24 MED ORDER — GLUCERNA SHAKE PO LIQD
237.0000 mL | ORAL | Status: DC
  Administered 2015-03-25: 237 mL via ORAL

## 2015-03-24 MED ORDER — ZOLPIDEM TARTRATE 5 MG PO TABS: 5.0000 mg | ORAL_TABLET | Freq: Every evening | ORAL | Status: DC | PRN

## 2015-03-24 MED ORDER — TERAZOSIN HCL 5 MG PO CAPS
10.0000 mg | ORAL_CAPSULE | Freq: Every day | ORAL | Status: DC
  Administered 2015-03-25 – 2015-03-26 (×2): 10 mg via ORAL
  Filled 2015-03-24 (×4): qty 2

## 2015-03-24 MED ORDER — ENSURE ENLIVE PO LIQD: 237.0000 mL | Freq: Two times a day (BID) | ORAL | Status: DC

## 2015-03-24 MED ORDER — PANTOPRAZOLE SODIUM 40 MG IV SOLR: 8.0000 mg/h | INTRAVENOUS | Status: DC

## 2015-03-24 MED ORDER — INSULIN ASPART 100 UNIT/ML ~~LOC~~ SOLN
0.0000 [IU] | Freq: Every day | SUBCUTANEOUS | Status: DC
  Administered 2015-03-26: 1 [IU] via SUBCUTANEOUS

## 2015-03-24 MED ORDER — SODIUM CHLORIDE 0.9 % IJ SOLN
3.0000 mL | Freq: Two times a day (BID) | INTRAMUSCULAR | Status: DC
  Administered 2015-03-24 – 2015-03-26 (×4): 3 mL via INTRAVENOUS

## 2015-03-24 MED ORDER — HYDROMORPHONE HCL 2 MG PO TABS
1.0000 mg | ORAL_TABLET | Freq: Four times a day (QID) | ORAL | Status: DC | PRN
Start: 2015-03-24 — End: 2015-03-26

## 2015-03-24 MED ORDER — FENTANYL 50 MCG/HR TD PT72
50.0000 ug | MEDICATED_PATCH | TRANSDERMAL | Status: DC
  Administered 2015-03-24: 50 ug via TRANSDERMAL
  Filled 2015-03-24: qty 2

## 2015-03-24 MED ORDER — BOOST / RESOURCE BREEZE PO LIQD
1.0000 | Freq: Two times a day (BID) | ORAL | Status: DC
  Administered 2015-03-24 – 2015-03-26 (×4): 1 via ORAL

## 2015-03-24 NOTE — ED Notes (Signed)
Two attempts to begin a second PIV in the right hand and forearm. Both had positive blood draw and flush with swelling around the sight and therefore were not utilized. No intolerance by the patient. Pressure applied.

## 2015-03-24 NOTE — Progress Notes (Signed)
Patient arrived to 3S from the ED on the room air and monitor. VSS (HR bradycardic 50's). Pt. Oriented to unit and room. Clothing at the bedside and BIPAP machine. Will continue to monitor pt.

## 2015-03-24 NOTE — Progress Notes (Signed)
Systolic in the 615'H. MD notified, no new orders given at this time. Will continue to monitor pt.

## 2015-03-24 NOTE — Progress Notes (Signed)
SUBJECTIVE:  No distress.  Breathing OK.     PHYSICAL EXAM Filed Vitals:   03/24/15 0700 03/24/15 0805 03/24/15 1155 03/24/15 1200  BP:  159/40 146/30 158/39  Pulse:  53 51 49  Temp: 97.8 F (36.6 C)   98 F (36.7 C)  TempSrc: Oral   Oral  Resp:  11 13 12   Height:      Weight:      SpO2:  92% 96% 97%   General:  No distress Lungs:  clear Heart:  RRR, systolic murmur Abdomen:  Positive bowel sounds, no rebound no guarding Extremities:  Mild edema   LABS:  Results for orders placed or performed during the hospital encounter of 03/23/15 (from the past 24 hour(s))  CBC with Differential     Status: Abnormal   Collection Time: 03/23/15  7:43 PM  Result Value Ref Range   WBC 4.7 4.0 - 10.5 K/uL   RBC 3.62 (L) 4.22 - 5.81 MIL/uL   Hemoglobin 10.6 (L) 13.0 - 17.0 g/dL   HCT 31.6 (L) 39.0 - 52.0 %   MCV 87.3 78.0 - 100.0 fL   MCH 29.3 26.0 - 34.0 pg   MCHC 33.5 30.0 - 36.0 g/dL   RDW 14.8 11.5 - 15.5 %   Platelets 143 (L) 150 - 400 K/uL   Neutrophils Relative % 82 (H) 43 - 77 %   Neutro Abs 3.8 1.7 - 7.7 K/uL   Lymphocytes Relative 7 (L) 12 - 46 %   Lymphs Abs 0.3 (L) 0.7 - 4.0 K/uL   Monocytes Relative 9 3 - 12 %   Monocytes Absolute 0.4 0.1 - 1.0 K/uL   Eosinophils Relative 2 0 - 5 %   Eosinophils Absolute 0.1 0.0 - 0.7 K/uL   Basophils Relative 0 0 - 1 %   Basophils Absolute 0.0 0.0 - 0.1 K/uL  Comprehensive metabolic panel     Status: Abnormal   Collection Time: 03/23/15  7:43 PM  Result Value Ref Range   Sodium 124 (L) 135 - 145 mmol/L   Potassium 6.3 (HH) 3.5 - 5.1 mmol/L   Chloride 96 96 - 112 mmol/L   CO2 11 (L) 19 - 32 mmol/L   Glucose, Bld 129 (H) 70 - 99 mg/dL   BUN 121 (H) 6 - 23 mg/dL   Creatinine, Ser 7.62 (H) 0.50 - 1.35 mg/dL   Calcium 8.8 8.4 - 10.5 mg/dL   Total Protein 6.5 6.0 - 8.3 g/dL   Albumin 3.6 3.5 - 5.2 g/dL   AST 16 0 - 37 U/L   ALT 11 0 - 53 U/L   Alkaline Phosphatase 73 39 - 117 U/L   Total Bilirubin 0.6 0.3 - 1.2 mg/dL   GFR  calc non Af Amer 6 (L) >90 mL/min   GFR calc Af Amer 7 (L) >90 mL/min   Anion gap 17 (H) 5 - 15  I-stat Chem 8, ED     Status: Abnormal   Collection Time: 03/23/15  7:43 PM  Result Value Ref Range   Sodium 123 (L) 135 - 145 mmol/L   Potassium 6.2 (HH) 3.5 - 5.1 mmol/L   Chloride 100 96 - 112 mmol/L   BUN 119 (H) 6 - 23 mg/dL   Creatinine, Ser 8.00 (H) 0.50 - 1.35 mg/dL   Glucose, Bld 128 (H) 70 - 99 mg/dL   Calcium, Ion 1.07 (L) 1.13 - 1.30 mmol/L   TCO2 11 0 - 100 mmol/L   Hemoglobin 12.2 (  L) 13.0 - 17.0 g/dL   HCT 36.0 (L) 39.0 - 52.0 %  I-Stat CG4 Lactic Acid, ED     Status: None   Collection Time: 03/23/15  7:44 PM  Result Value Ref Range   Lactic Acid, Venous 1.54 0.5 - 2.0 mmol/L  Urinalysis, Routine w reflex microscopic     Status: Abnormal   Collection Time: 03/24/15  1:26 AM  Result Value Ref Range   Color, Urine YELLOW YELLOW   APPearance CLOUDY (A) CLEAR   Specific Gravity, Urine 1.010 1.005 - 1.030   pH 5.0 5.0 - 8.0   Glucose, UA NEGATIVE NEGATIVE mg/dL   Hgb urine dipstick NEGATIVE NEGATIVE   Bilirubin Urine NEGATIVE NEGATIVE   Ketones, ur NEGATIVE NEGATIVE mg/dL   Protein, ur 30 (A) NEGATIVE mg/dL   Urobilinogen, UA 0.2 0.0 - 1.0 mg/dL   Nitrite NEGATIVE NEGATIVE   Leukocytes, UA NEGATIVE NEGATIVE  MRSA PCR Screening     Status: None   Collection Time: 03/24/15  1:26 AM  Result Value Ref Range   MRSA by PCR NEGATIVE NEGATIVE  Urine microscopic-add on     Status: Abnormal   Collection Time: 03/24/15  1:26 AM  Result Value Ref Range   Squamous Epithelial / LPF RARE RARE   Bacteria, UA RARE RARE   Casts HYALINE CASTS (A) NEGATIVE  Glucose, capillary     Status: Abnormal   Collection Time: 03/24/15  2:28 AM  Result Value Ref Range   Glucose-Capillary 120 (H) 70 - 99 mg/dL  CBC     Status: Abnormal   Collection Time: 03/24/15  2:40 AM  Result Value Ref Range   WBC 4.0 4.0 - 10.5 K/uL   RBC 3.63 (L) 4.22 - 5.81 MIL/uL   Hemoglobin 10.7 (L) 13.0 - 17.0  g/dL   HCT 31.4 (L) 39.0 - 52.0 %   MCV 86.5 78.0 - 100.0 fL   MCH 29.5 26.0 - 34.0 pg   MCHC 34.1 30.0 - 36.0 g/dL   RDW 14.7 11.5 - 15.5 %   Platelets 139 (L) 150 - 400 K/uL  Basic metabolic panel     Status: Abnormal   Collection Time: 03/24/15  2:40 AM  Result Value Ref Range   Sodium 129 (L) 135 - 145 mmol/L   Potassium 5.6 (H) 3.5 - 5.1 mmol/L   Chloride 97 96 - 112 mmol/L   CO2 17 (L) 19 - 32 mmol/L   Glucose, Bld 117 (H) 70 - 99 mg/dL   BUN 114 (H) 6 - 23 mg/dL   Creatinine, Ser 6.89 (H) 0.50 - 1.35 mg/dL   Calcium 8.8 8.4 - 10.5 mg/dL   GFR calc non Af Amer 7 (L) >90 mL/min   GFR calc Af Amer 8 (L) >90 mL/min   Anion gap 15 5 - 15  Iron and TIBC     Status: Abnormal   Collection Time: 03/24/15  2:40 AM  Result Value Ref Range   Iron 32 (L) 42 - 165 ug/dL   TIBC 312 215 - 435 ug/dL   Saturation Ratios 10 (L) 20 - 55 %   UIBC 280 125 - 400 ug/dL  Ferritin     Status: None   Collection Time: 03/24/15  2:40 AM  Result Value Ref Range   Ferritin 87 22 - 322 ng/mL  Glucose, capillary     Status: Abnormal   Collection Time: 03/24/15  8:05 AM  Result Value Ref Range   Glucose-Capillary 110 (H) 70 -  99 mg/dL   Comment 1 Notify RN    Comment 2 Document in Chart   Protime-INR     Status: Abnormal   Collection Time: 03/24/15  8:16 AM  Result Value Ref Range   Prothrombin Time 17.7 (H) 11.6 - 15.2 seconds   INR 1.44 0.00 - 1.49  Type and screen     Status: None   Collection Time: 03/24/15  8:16 AM  Result Value Ref Range   ABO/RH(D) O POS    Antibody Screen NEG    Sample Expiration 03/27/2015   Glucose, capillary     Status: Abnormal   Collection Time: 03/24/15 12:19 PM  Result Value Ref Range   Glucose-Capillary 124 (H) 70 - 99 mg/dL    Intake/Output Summary (Last 24 hours) at 03/24/15 1423 Last data filed at 03/24/15 1338  Gross per 24 hour  Intake 1155.41 ml  Output   2251 ml  Net -1095.59 ml     ASSESSMENT AND PLAN:  Atrial fib:  Bradycardia.  Xarelto  on hold with CKD and evidence of GI bleeding.  We will restart once creat improves.  GI is not planning a work up.    ARF:    Creat is slightly better.  On isotonic sodium bicarb.  Follow with renal.    CAD/CABG/AVR:   No acute coronary event suspected.    Minus Breeding 03/24/2015 2:23 PM

## 2015-03-24 NOTE — Progress Notes (Signed)
INITIAL NUTRITION ASSESSMENT  DOCUMENTATION CODES Per approved criteria  -Severe malnutrition in the context of acute illness or injury  Pt meets criteria for SEVERE MALNUTRITION in the context of ACUTE ILLNESS/INJURY as evidenced by energy intake <50% of estimated needs for > 5 days and 6% weight loss in less than 1 month.  INTERVENTION: Diet advancement per MD Provide Resource Breeze BID, each supplement provides 250 kcal and 9 grams of protein Provide Glucerna Shakes once daily, provides 220 kcal and 10 grams of protein Encourage PO intake Add snacks when diet advanced  NUTRITION DIAGNOSIS: Inadequate oral intake related to pain and nausea as evidenced by pt's report of no PO x 1.5 weeks and current meal completion <50%.   Goal: Pt to meet >/= 90% of their estimated nutrition needs   Monitor:  PO intake, diet advancement, weight trend, labs  Reason for Assessment: Consult for assessment of nutrition status/requirements  74 y.o. male  Admitting Dx: Acute on chronic renal failure  ASSESSMENT: 74 y.o. male with known history of CAD s/p 3 v CABG 2006, atrial fibrillation, OA, IDDM, HTN, CKD stage 3-4, recent right rib fracture due to mechanical fall who presented today with chief complaint of generalized weakness and significant bradycardia.   Pt remains on a full liquid diet today. Pt states that for 1.5 weeks PTA he was not eating or drinking anything due to pain from his rib fracture and nausea. He reports ongoing poor appetite today; some coffee and juice for breakfast, some jello, tea, and milk for lunch. Lunch tray at bedside- pt only ate a few bites of jello, a few sips of tea. Pt states he usually weighs 250 lbs. No evidence of  wasting; weight history shows recent 15 lbs weight loss-6%. RD emphasized the importance of nutrition and provide options. Pt agreeable to receiving nutritional supplements and snacks.  Labs: low sodium, elevated potassium, high BUN, low GFR, high  creatinine, low iron  Nutrition Focused Physical Exam:  Subcutaneous Fat:  Orbital Region: wnl Upper Arm Region: wnl Thoracic and Lumbar Region: wnl  Muscle:  Temple Region: wnl Clavicle Bone Region: wnl Clavicle and Acromion Bone Region: wnl Scapular Bone Region: wnl Dorsal Hand: wnl Patellar Region: wnl Anterior Thigh Region: wnl Posterior Calf Region: wnl  Edema: +1 RLE and LLE edema   Height: Ht Readings from Last 1 Encounters:  03/23/15 6' (1.829 m)    Weight: Wt Readings from Last 1 Encounters:  03/23/15 250 lb (113.399 kg)    Ideal Body Weight: 178 lbs  % Ideal Body Weight: 140%  Wt Readings from Last 10 Encounters:  03/23/15 250 lb (113.399 kg)  03/01/15 265 lb 9.6 oz (120.475 kg)  12/30/14 257 lb 12.8 oz (116.937 kg)  12/09/14 253 lb 12.8 oz (115.123 kg)  11/17/14 256 lb (116.121 kg)  05/31/14 264 lb (119.75 kg)  05/27/14 268 lb 1.6 oz (121.609 kg)  03/19/14 267 lb (121.11 kg)  01/20/14 276 lb (125.193 kg)  01/05/14 250 lb (113.399 kg)    Usual Body Weight: 250 lbs  % Usual Body Weight: 100%  BMI:  Body mass index is 33.9 kg/(m^2). (Obese)  Estimated Nutritional Needs: Kcal: 1700-2000 Protein: 90-100 grams Fluid: 2.8 L/day  Skin: intact  Diet Order: Diet full liquid Room service appropriate?: Yes; Fluid consistency:: Thin Diet NPO time specified Except for: Sips with Meds  EDUCATION NEEDS: -No education needs identified at this time   Intake/Output Summary (Last 24 hours) at 03/24/15 1506 Last data filed at 03/24/15  1338  Gross per 24 hour  Intake 1155.41 ml  Output   2251 ml  Net -1095.59 ml    Last BM: 3/25  Labs:   Recent Labs Lab 03/23/15 1943 03/24/15 0240  NA 124*  123* 129*  K 6.3*  6.2* 5.6*  CL 96  100 97  CO2 11* 17*  BUN 121*  119* 114*  CREATININE 7.62*  8.00* 6.89*  CALCIUM 8.8 8.8  GLUCOSE 129*  128* 117*    CBG (last 3)   Recent Labs  03/24/15 0228 03/24/15 0805 03/24/15 1219  GLUCAP  120* 110* 124*    Scheduled Meds: . amLODipine  10 mg Oral Daily  . aspirin EC  81 mg Oral QHS  . feeding supplement (ENSURE ENLIVE)  237 mL Oral BID BM  . fentaNYL  50 mcg Transdermal Q72H  . insulin aspart  0-5 Units Subcutaneous QHS  . insulin aspart  0-9 Units Subcutaneous TID WC  . metoprolol tartrate  25 mg Oral BID  . [START ON 03/27/2015] pantoprazole (PROTONIX) IV  40 mg Intravenous Q12H  . sodium chloride  3 mL Intravenous Q12H  . terazosin  10 mg Oral Q breakfast    Continuous Infusions: . sodium chloride 75 mL/hr at 03/24/15 1200  . pantoprozole (PROTONIX) infusion 8 mg/hr (03/24/15 1324)  .  sodium bicarbonate  infusion 1000 mL 50 mL/hr at 03/23/15 2038    Past Medical History  Diagnosis Date  . Coronary heart disease     stent, CABG, RBBB  . Valvular heart disease     aortic stenosis/regurgitation  . Sarcoid 2011    pulmonary and bone marrow  . Hypercalcemia 2011    due to sarcoidiosis  . Bone marrow disease   . Diabetes mellitus   . Hemorrhagic cystitis 2012  . Gout   . HTN (hypertension)   . A-fib     controlled with medication  . Myocardial infarction     1995, 2002, 2006  . OSA (obstructive sleep apnea)     use bipap setting of 10 and 12  . Complication of anesthesia 11-16-13    required alot of versed per anesthesia with cataract surgery  . Chronic kidney disease (CKD), stage III (moderate)     lov note dr Koleen Nimrod nephrology 09-17-13 on chart  . Heart murmur   . Atrial flutter     hx of  . CHF (congestive heart failure)   . Pneumonia 1966, 2011    hx of  . Anxiety   . Osteoarthritis   . DDD (degenerative disc disease)   . Synovial cyst of lumbar spine     l3-l4, l4-l5, injections  . Anemia   . Hepatitis     mono  . Cancer     basal cell  . Right sciatic nerve pain     for block thursday 01-21-2014, injections  . Fungal toenail infection   . Biceps tendon tear     right  . Diabetes     Past Surgical History  Procedure Laterality  Date  . Cataract surgery Right 2007  . Prostate surgery  oct. 2012    TURP  . Vasectomy  1977  . Redo median sternotomy, extracorporeal cirulation, avr, tricuspid valve repair  06/13/2011    AVR(23-mm Edwards pericardial Magna-Ease valve./ TVrepair (34-mm Edwards MC3 annuloplasty ring  . Coronary artery bypass graft  2006    x 5  . Cataract surgery Left 11-16-13  . Cardioversion N/A 12/30/2013    Procedure:  CARDIOVERSION;  Surgeon: [name], MD;  Location: [facility] ENDOSCOPY;  Service: Cardiovascular;  Laterality: N/A;  10:49 cardioversion at 120 joules, then 150 joules, to SB  used  Lido 30mg ,  Propofol 160 mcg  . Colonoscopy N/A 01/29/2014    Procedure: COLONOSCOPY;  Surgeon: Winfield Cunas., MD;  Location: [facility] ENDOSCOPY;  Service: Endoscopy;  Laterality: N/A;  amanda//ja  . Cardioversion  2003, 2006, 2012, 2013  . Portacath placement    . Portacath removed    . Basal cell carcinoma excision Right 2015    ear  . Total knee arthroplasty Right 05/24/2014    Procedure: RIGHT TOTAL KNEE ARTHROPLASTY;  Surgeon: Gearlean Alf, MD;  Location: [facility] ORS;  Service: Orthopedics;  Laterality: Right;    Pryor Ochoa RD, LDN Inpatient Clinical Dietitian Pager: [phone] After Hours Pager: [phone]

## 2015-03-24 NOTE — Progress Notes (Signed)
[facility] Rounding Note Subjective:  Only real complaint is pain from his rib fracture Denies SOB just "hurts to breathe" Would like some dilaudid  Objective Vital signs in last 24 hours: Filed Vitals:   03/24/15 0052 03/24/15 0307 03/24/15 0312 03/24/15 0700  BP: 179/46 170/41    Pulse: 59 55 60   Temp: 97.7 F (36.5 C)  97.6 F (36.4 C) 97.8 F (36.6 C)  TempSrc: Oral  Oral Oral  Resp: 16 15 12    Height:      Weight:      SpO2: 95% 92% 96%    Weight change:   Intake/Output Summary (Last 24 hours) at 03/24/15 0838 Last data filed at 03/24/15 3220  Gross per 24 hour  Intake 318.33 ml  Output   1500 ml  Net -1181.67 ml     Physical Exam:  BP 170/41 mmHg  Pulse 60  Temp(Src) 97.8 F (36.6 C) (Oral)  Resp 12  Ht 6' (1.829 m)  Wt 113.399 kg (250 lb)  BMI 33.90 kg/m2  SpO2 96% Very pleasant older gentleman In no distress - wearing his BIPAP so he can get some sleep Cannot see his neck veins due to body habitus Lungs clear Tenderness right lower rib cage area of rib fracture S1S2 No S3 2/6 murmur USB and across precordium.  Apical pansystolic murmur.  No rub Protuberant abdomen + BS not tender 2+ edema bilateral LE's  Labs: Basic Metabolic Panel:  Recent Labs Lab 03/23/15 1943 03/24/15 0240  NA 124*  123* 129*  K 6.3*  6.2* 5.6*  CL 96  100 97  CO2 11* 17*  GLUCOSE 129*  128* 117*  BUN 121*  119* 114*  CREATININE 7.62*  8.00* 6.89*  CALCIUM 8.8 8.8      Recent Labs Lab 03/23/15 1943  AST 16  ALT 11  ALKPHOS 73  BILITOT 0.6  PROT 6.5  ALBUMIN 3.6     Recent Labs Lab 03/23/15 1943 03/24/15 0240  WBC 4.7 4.0  NEUTROABS 3.8  --   HGB 12.2*  10.6* 10.7*  HCT 36.0*  31.6* 31.4*  MCV 87.3 86.5  PLT 143* 139*     Recent Labs Lab 03/24/15 0228  GLUCAP 120*    Medications: . sodium chloride 50 mL/hr at 03/24/15 0215  . pantoprozole (PROTONIX) infusion 8 mg/hr (03/24/15 0204)  .  sodium bicarbonate   infusion 1000 mL 50 mL/hr at 03/23/15 2038   . amLODipine  10 mg Oral Daily  . aspirin EC  81 mg Oral QHS  . feeding supplement (ENSURE ENLIVE)  237 mL Oral BID BM  . insulin aspart  0-5 Units Subcutaneous QHS  . insulin aspart  0-9 Units Subcutaneous TID WC  . [START ON 03/27/2015] pantoprazole (PROTONIX) IV  40 mg Intravenous Q12H  . sodium chloride  3 mL Intravenous Q12H  . terazosin  10 mg Oral Q breakfast   Assessment: 1. Acute Kidney Injury - likely multifactorial from ischemic ATN secondary to hypotension + lisinopril + volume depletion. Also possible that this is on the other end of the spectrum with prerenal azotemia. Less likely to be obstructive given no history of obstruction + h/o TURP. Hyperkalemia resulting from the AKI with no history of decreased UOP or dyspnea. Creatinine is falling, K has improved and acidosis better.  2. Hypotension - Resolved (volume depletion by history with no signs of infection or cardiac event - recently started on BiDil and had been on lisinopril) BP actually  somewhat elevated today. . 3. Diabetes 4. H/o aortic valve replacement + CASHD. 5. H/o Sarcoid  Plan 1. Discontinue NS. Continue isotonic sodium bicarb at 50 for another 24 hours and continue to monitor urine output. Will recheck renal panel at 4PM 2.   No indication for acute dialysis and expect will continue to improve 3.   Continue to hold lasix for right now  Jamal Maes, MD [place] [phone] pager 03/24/2015, 8:38 AM

## 2015-03-24 NOTE — Consult Note (Signed)
[facility] Consult Note  Referring Provider: No ref. provider found Primary Care Physician:  [name], MD Primary Gastroenterologist:  Dr.  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 He has also had a recent following rib fracture and has not been eating very well due to pain and taking Dilaudid for the pain. Briefly while in the emergency room he vomited twice and on the third time noted coffee-ground to bloody emesis. He actually was not even nauseated and has not had any nausea since. He has been on a full liquid diet today. His admission hemoglobin was 10.7 and was also 10.6 on the last determination. He has not had a stool since he has been here. He's had colonoscopy in the last year and a half by Dr. Oletta Lamas. He has never had an EGD, never had upper GI bleeding peptic ulcer disease or acid reflux. He was on low-dose a relative and aspirin when he came in.  Past Medical History  Diagnosis Date  . Coronary heart disease     stent, CABG, RBBB  . Valvular heart disease     aortic stenosis/regurgitation  . Sarcoid 2011    pulmonary and bone marrow  . Hypercalcemia 2011    due to sarcoidiosis  . Bone marrow disease   . Diabetes mellitus   . Hemorrhagic cystitis 2012  . Gout   . HTN (hypertension)   . A-fib     controlled with medication  . Myocardial infarction     1995, 2002, 2006  . OSA (obstructive sleep apnea)     use bipap setting of 10 and 12  . Complication of anesthesia 11-16-13    required alot of versed per anesthesia with cataract surgery  . Chronic kidney disease (CKD), stage III (moderate)     lov note dr Koleen Nimrod nephrology 09-17-13 on chart  . Heart murmur   . Atrial flutter     hx of  . CHF (congestive heart  failure)   . Pneumonia 1966, 2011    hx of  . Anxiety   . Osteoarthritis   . DDD (degenerative disc disease)   . Synovial cyst of lumbar spine     l3-l4, l4-l5, injections  . Anemia   . Hepatitis     mono  . Cancer     basal cell  . Right sciatic nerve pain     for block thursday 01-21-2014, injections  . Fungal toenail infection   . Biceps tendon tear     right  . Diabetes     Past Surgical History  Procedure Laterality Date  . Cataract surgery Right 2007  . Prostate surgery  oct. 2012    TURP  . Vasectomy  1977  . Redo median sternotomy, extracorporeal cirulation, avr, tricuspid valve repair  06/13/2011    AVR(23-mm Edwards pericardial Magna-Ease valve./ TVrepair (34-mm Edwards MC3 annuloplasty ring  . Coronary artery bypass graft  2006    x 5  . Cataract surgery Left 11-16-13  . Cardioversion N/A 12/30/2013    Procedure: CARDIOVERSION;  Surgeon: [name], MD;  Location: [facility] ENDOSCOPY;  Service: Cardiovascular;  Laterality: N/A;  10:49 cardioversion at 120 joules, then 150 joules, to SB  used  Lido 58m,  Propofol 160  mcg  . Colonoscopy N/A 01/29/2014    Procedure: COLONOSCOPY;  Surgeon: Winfield Cunas., MD;  Location: [facility] ENDOSCOPY;  Service: Endoscopy;  Laterality: N/A;  amanda//ja  . Cardioversion  2003, 2006, 2012, 2013  . Portacath placement    . Portacath removed    . Basal cell carcinoma excision Right 2015    ear  . Total knee arthroplasty Right 05/24/2014    Procedure: RIGHT TOTAL KNEE ARTHROPLASTY;  Surgeon: Gearlean Alf, MD;  Location: [facility] ORS;  Service: Orthopedics;  Laterality: Right;    Medications Prior to Admission  Medication Sig Dispense Refill  . acetaminophen (TYLENOL) 500 MG tablet Take 1,000 mg by mouth every 6 (six) hours as needed (pain).     [name] allopurinol (ZYLOPRIM) 100 MG tablet Take 200 mg by mouth daily with breakfast.     . aspirin EC 81 MG tablet Take 81 mg by mouth at bedtime.     [name] CALCIUM-MAGNESIUM-VITAMIN D ER PO Take 1 tablet  by mouth daily. Calcium citrate 200 mg, magnesium 100 mg, Vitamin D 200 units    . diazepam (VALIUM) 10 MG tablet Take 1 tablet (10 mg total) by mouth at bedtime. FOR BLOOD PRESSURE (Patient taking differently: Take 10 mg by mouth as needed (leg/ankle cramps). FOR BLOOD PRESSURE) 30 tablet 3  . HYDROmorphone (DILAUDID) 2 MG tablet Take 2 mg by mouth every 4 (four) hours as needed (pain).   0  . insulin glargine (LANTUS) 100 unit/mL SOPN Inject 2-3 Units into the skin at bedtime as needed (CBG >120). CBG 120-140 2 units, 140-160 3 units    . lisinopril (PRINIVIL,ZESTRIL) 40 MG tablet Take 40 mg by mouth daily with breakfast.    . metoprolol tartrate (LOPRESSOR) 25 MG tablet TAKE 1 TABLET BY MOUTH TWICE A DAY 60 tablet 0  . terazosin (HYTRIN) 10 MG capsule Take 10 mg by mouth daily with breakfast.    . testosterone cypionate (DEPOTESTOTERONE CYPIONATE) 200 MG/ML injection Inject 34m intramuscularly as directed twice a month (Patient taking differently: Inject 200 mg into the muscle every 14 (fourteen) days. Last injection March 16 or 17, 2016) 10 mL 0  . traMADol (ULTRAM) 50 MG tablet Take 50-100 mg by mouth every 4 (four) hours as needed (pain).   0  . XARELTO 15 MG TABS tablet TAKE 1 TABLET BY MOUTH EVERY DAY (Patient taking differently: TAKE 1 TABLET BY MOUTH DAILY WITH SUPPER) 30 tablet 5  . zolpidem (AMBIEN) 10 MG tablet Take 10 mg by mouth at bedtime as needed for sleep.     . hydrALAZINE (APRESOLINE) 25 MG tablet Take 25 mg by mouth 2 (two) times daily.  6  . HYDROmorphone (DILAUDID) 4 MG tablet Take one tablet by mouth every 4 hours as needed for pain; Take 1/2 tablet by mouth as needed if 467mnot effective (Patient not taking: Reported on 03/23/2015) 180 tablet 0  . isosorbide dinitrate (ISORDIL) 20 MG tablet Take 20 mg by mouth 2 (two) times daily.  6  . ONETOUCH VERIO test strip 2 (two) times daily. use for testing  6    Allergies:  Allergies  Allergen Reactions  . Actos [Pioglitazone  Hydrochloride] Swelling and Other (See Comments)    Severe peripheral edema  . Penicillins Swelling and Other (See Comments)    Serum sickness  . Amiodarone Other (See Comments)    diarrhea  . Latex Swelling  . Codeine Other (See Comments)    Makes Pt aggressive  . Depakote [  Divalproex Sodium] Diarrhea    severe  . Januvia [Sitagliptin] Diarrhea and Other (See Comments)    bradycardia  . Loop Diuretics Other (See Comments)    Spike in BUN levels  . Losartan Other (See Comments)    aphysia  . Nsaids Other (See Comments)    Renal problems  . Onglyza [Saxagliptin] Diarrhea and Other (See Comments)    bradycardia  . Orudis [Ketoprofen] Other (See Comments)    Cannot take due to renal insufficiency  . Pentazocine Lactate Other (See Comments)    Unknown allergic reaction - pt and wife do not recall this  . Rozerem [Ramelteon] Diarrhea    Severe   . Sertraline Hcl Other (See Comments)    Pt and wife do not recall this  . Benazepril Hcl Other (See Comments)    ? Possible lowers plateletes  . Indomethacin Other (See Comments)    Renal problems   . Minocycline Other (See Comments)    Makes dizzy and feel just lousy.  . Poison Ivy Extract [Extract Of Poison Ivy] Rash and Other (See Comments)    blisters  . Potassium Iodide Itching and Rash  . Shellfish Allergy Rash    Family History  Problem Relation Age of Onset  . Heart attack Father   . Allergies    . Asthma    . Heart disease    . Cancer      Social History:  reports that he quit smoking about 19 years ago. His smoking use included Pipe and Cigars. He has never used smokeless tobacco. He reports that he drinks alcohol. He reports that he does not use illicit drugs.  Review of Systems: negative except as above   Blood pressure 158/39, pulse 49, temperature 98 F (36.7 C), temperature source Oral, resp. rate 12, height 6' (1.829 m), weight 113.399 kg (250 lb), SpO2 97 %. Head: Normocephalic, without obvious  abnormality, atraumatic Neck: no adenopathy, no carotid bruit, no JVD, supple, symmetrical, trachea midline and thyroid not enlarged, symmetric, no tenderness/mass/nodules Resp: clear to auscultation bilaterally Cardio: regular rate and rhythm, S1, S2 normal, no murmur, click, rub or gallop GI: Abdomen symmetrically distended and soft with normoactive bowel sounds. No hepatomegaly masses or guarding Extremities: extremities normal, atraumatic, no cyanosis or edema  Results for orders placed or performed during the hospital encounter of 03/23/15 (from the past 48 hour(s))  CBC with Differential     Status: Abnormal   Collection Time: 03/23/15  7:43 PM  Result Value Ref Range   WBC 4.7 4.0 - 10.5 K/uL   RBC 3.62 (L) 4.22 - 5.81 MIL/uL   Hemoglobin 10.6 (L) 13.0 - 17.0 g/dL   HCT 31.6 (L) 39.0 - 52.0 %   MCV 87.3 78.0 - 100.0 fL   MCH 29.3 26.0 - 34.0 pg   MCHC 33.5 30.0 - 36.0 g/dL   RDW 14.8 11.5 - 15.5 %   Platelets 143 (L) 150 - 400 K/uL   Neutrophils Relative % 82 (H) 43 - 77 %   Neutro Abs 3.8 1.7 - 7.7 K/uL   Lymphocytes Relative 7 (L) 12 - 46 %   Lymphs Abs 0.3 (L) 0.7 - 4.0 K/uL   Monocytes Relative 9 3 - 12 %   Monocytes Absolute 0.4 0.1 - 1.0 K/uL   Eosinophils Relative 2 0 - 5 %   Eosinophils Absolute 0.1 0.0 - 0.7 K/uL   Basophils Relative 0 0 - 1 %   Basophils Absolute 0.0 0.0 - 0.1   K/uL  Comprehensive metabolic panel     Status: Abnormal   Collection Time: 03/23/15  7:43 PM  Result Value Ref Range   Sodium 124 (L) 135 - 145 mmol/L   Potassium 6.3 (HH) 3.5 - 5.1 mmol/L    Comment: REPEATED TO VERIFY CRITICAL RESULT CALLED TO, READ BACK BY AND VERIFIED WITH: Pamalee Leyden 2047 03/23/15 WBOND    Chloride 96 96 - 112 mmol/L   CO2 11 (L) 19 - 32 mmol/L   Glucose, Bld 129 (H) 70 - 99 mg/dL   BUN 121 (H) 6 - 23 mg/dL   Creatinine, Ser 7.62 (H) 0.50 - 1.35 mg/dL   Calcium 8.8 8.4 - 10.5 mg/dL   Total Protein 6.5 6.0 - 8.3 g/dL   Albumin 3.6 3.5 - 5.2 g/dL   AST 16 0  - 37 U/L   ALT 11 0 - 53 U/L   Alkaline Phosphatase 73 39 - 117 U/L   Total Bilirubin 0.6 0.3 - 1.2 mg/dL   GFR calc non Af Amer 6 (L) >90 mL/min   GFR calc Af Amer 7 (L) >90 mL/min    Comment: (NOTE) The eGFR has been calculated using the CKD EPI equation. This calculation has not been validated in all clinical situations. eGFR's persistently <90 mL/min signify possible Chronic Kidney Disease.    Anion gap 17 (H) 5 - 15  I-stat Chem 8, ED     Status: Abnormal   Collection Time: 03/23/15  7:43 PM  Result Value Ref Range   Sodium 123 (L) 135 - 145 mmol/L   Potassium 6.2 (HH) 3.5 - 5.1 mmol/L   Chloride 100 96 - 112 mmol/L   BUN 119 (H) 6 - 23 mg/dL   Creatinine, Ser 8.00 (H) 0.50 - 1.35 mg/dL   Glucose, Bld 128 (H) 70 - 99 mg/dL   Calcium, Ion 1.07 (L) 1.13 - 1.30 mmol/L   TCO2 11 0 - 100 mmol/L   Hemoglobin 12.2 (L) 13.0 - 17.0 g/dL   HCT 36.0 (L) 39.0 - 52.0 %  I-Stat CG4 Lactic Acid, ED     Status: None   Collection Time: 03/23/15  7:44 PM  Result Value Ref Range   Lactic Acid, Venous 1.54 0.5 - 2.0 mmol/L  Urinalysis, Routine w reflex microscopic     Status: Abnormal   Collection Time: 03/24/15  1:26 AM  Result Value Ref Range   Color, Urine YELLOW YELLOW   APPearance CLOUDY (A) CLEAR   Specific Gravity, Urine 1.010 1.005 - 1.030   pH 5.0 5.0 - 8.0   Glucose, UA NEGATIVE NEGATIVE mg/dL   Hgb urine dipstick NEGATIVE NEGATIVE   Bilirubin Urine NEGATIVE NEGATIVE   Ketones, ur NEGATIVE NEGATIVE mg/dL   Protein, ur 30 (A) NEGATIVE mg/dL   Urobilinogen, UA 0.2 0.0 - 1.0 mg/dL   Nitrite NEGATIVE NEGATIVE   Leukocytes, UA NEGATIVE NEGATIVE  MRSA PCR Screening     Status: None   Collection Time: 03/24/15  1:26 AM  Result Value Ref Range   MRSA by PCR NEGATIVE NEGATIVE    Comment:        The GeneXpert MRSA Assay (FDA approved for NASAL specimens only), is one component of a comprehensive MRSA colonization surveillance program. It is not intended to diagnose  MRSA infection nor to guide or monitor treatment for MRSA infections.   Urine microscopic-add on     Status: Abnormal   Collection Time: 03/24/15  1:26 AM  Result Value Ref Range   Squamous  Epithelial / LPF RARE RARE   Bacteria, UA RARE RARE   Casts HYALINE CASTS (A) NEGATIVE  Glucose, capillary     Status: Abnormal   Collection Time: 03/24/15  2:28 AM  Result Value Ref Range   Glucose-Capillary 120 (H) 70 - 99 mg/dL  CBC     Status: Abnormal   Collection Time: 03/24/15  2:40 AM  Result Value Ref Range   WBC 4.0 4.0 - 10.5 K/uL   RBC 3.63 (L) 4.22 - 5.81 MIL/uL   Hemoglobin 10.7 (L) 13.0 - 17.0 g/dL   HCT 31.4 (L) 39.0 - 52.0 %   MCV 86.5 78.0 - 100.0 fL   MCH 29.5 26.0 - 34.0 pg   MCHC 34.1 30.0 - 36.0 g/dL   RDW 14.7 11.5 - 15.5 %   Platelets 139 (L) 150 - 400 K/uL  Basic metabolic panel     Status: Abnormal   Collection Time: 03/24/15  2:40 AM  Result Value Ref Range   Sodium 129 (L) 135 - 145 mmol/L   Potassium 5.6 (H) 3.5 - 5.1 mmol/L   Chloride 97 96 - 112 mmol/L   CO2 17 (L) 19 - 32 mmol/L   Glucose, Bld 117 (H) 70 - 99 mg/dL   BUN 114 (H) 6 - 23 mg/dL   Creatinine, Ser 6.89 (H) 0.50 - 1.35 mg/dL   Calcium 8.8 8.4 - 10.5 mg/dL   GFR calc non Af Amer 7 (L) >90 mL/min   GFR calc Af Amer 8 (L) >90 mL/min    Comment: (NOTE) The eGFR has been calculated using the CKD EPI equation. This calculation has not been validated in all clinical situations. eGFR's persistently <90 mL/min signify possible Chronic Kidney Disease.    Anion gap 15 5 - 15  Iron and TIBC     Status: Abnormal   Collection Time: 03/24/15  2:40 AM  Result Value Ref Range   Iron 32 (L) 42 - 165 ug/dL   TIBC 312 215 - 435 ug/dL   Saturation Ratios 10 (L) 20 - 55 %   UIBC 280 125 - 400 ug/dL    Comment: Performed at [facility]  Ferritin     Status: None   Collection Time: 03/24/15  2:40 AM  Result Value Ref Range   Ferritin 87 22 - 322 ng/mL    Comment: Performed at [facility]  Glucose, capillary     Status: Abnormal   Collection Time: 03/24/15  8:05 AM  Result Value Ref Range   Glucose-Capillary 110 (H) 70 - 99 mg/dL   Comment 1 Notify RN    Comment 2 Document in Chart   Protime-INR     Status: Abnormal   Collection Time: 03/24/15  8:16 AM  Result Value Ref Range   Prothrombin Time 17.7 (H) 11.6 - 15.2 seconds   INR 1.44 0.00 - 1.49  Type and screen     Status: None   Collection Time: 03/24/15  8:16 AM  Result Value Ref Range   ABO/RH(D) O POS    Antibody Screen NEG    Sample Expiration 03/27/2015   Glucose, capillary     Status: Abnormal   Collection Time: 03/24/15 12:19 PM  Result Value Ref Range   Glucose-Capillary 124 (H) 70 - 99 mg/dL   No results found.  Assessment: Uremia, bradycardia and vomiting with coffee ground appearance which seems to have stopped with no signs of non-destabilizing bleeding. Could possibly of developed vomiting secondary to uremia  and pain medicines and developed Mallory-Weiss tear. Plan:  Discussed endoscopy but both he and I are comfortable holding off for now. Will continue proton pump inhibitor and monitor stools and hemoglobin. We'll hold nothing by mouth after midnight tonight in case start on her evidence of GI bleeding occurs warranting endoscopy. HAYES,JOHN C 03/24/2015, 1:18 PM    

## 2015-03-24 NOTE — Progress Notes (Signed)
UR COMPLETED  

## 2015-03-24 NOTE — Progress Notes (Signed)
Patient Demographics  [name], is a 74 y.o. male, DOB - [birth year], TDS:[id number]  Admit date - 03/23/2015   Admitting Physician Flossie Dibble, MD  Outpatient Primary MD for the patient is FRIED, [name], MD  LOS - 1   Chief Complaint  Patient presents with  . Abnormal Lab        Subjective:   [name] today has, No headache, No chest pain, No abdominal pain - No Nausea, No new weakness tingling or numbness, No Cough - SOB. Having right-sided rib cage pain.  Assessment & Plan    1. Acute renal failure on CK D stage IV baseline creatinine 2.2 due to combination of dehydration from poor oral intake for the last 3-4 days secondary to rib cage pain, ACE inhibitor on board, recent blood pressure medication dish and causing relative hypotension.   Appreciate renal input, improving with with IV fluids which I have increased, post IV bicarbonate, potassium and bicarbonate levels along with renal function are improving. Further workup per nephrology.     2. Emesis 3 in the ER with some evidence of upper GI bleed. Likely developed nausea and vomiting due to uremia from #1 above, subsequent Mallory-Weiss tear. Bled easily as he was on xaralto. Xaralto on hold, IV PPI, type screen done, H&H stable. Request GI to evaluate one time.     3. CAD s/p 3 v CABG 2006, AS s/p AVR with bioprosthetic valve in 2012, Chr. atrial fibrillation with [place] S2 VASC score of 5 - cardiology following, xaralto on hold, for now on 81 mg of aspirin if there is evidence of ongoing GI bleed this will be stopped, further input per cardiology, will resume home dose beta blocker.    4. Essential hypertension. On Norvasc, added back Lopressor, as needed IV hydralazine. We will avoid acute drop in blood pressure  due to acute renal failure.    5. DM type II. On sliding scale will continue to monitor.   Lab Results  Component Value Date   HGBA1C 6.8* 05/27/2014    CBG (last 3)   Recent Labs  03/24/15 0228  GLUCAP 120*    6. Recent mechanical fall with right-sided rib cage fracture. Supportive care with pain control.     Code Status: Full  Family Communication: None  Disposition Plan: Home   Procedures      Consults  cardiology, GI, renal   Medications  Scheduled Meds: . amLODipine  10 mg Oral Daily  . aspirin EC  81 mg Oral QHS  . feeding supplement (ENSURE ENLIVE)  237 mL Oral BID BM  . fentaNYL  50 mcg Transdermal Q72H  . insulin aspart  0-5 Units Subcutaneous QHS  . insulin aspart  0-9 Units Subcutaneous TID WC  . [START ON 03/27/2015] pantoprazole (PROTONIX) IV  40 mg Intravenous Q12H  . sodium chloride  3 mL Intravenous Q12H  . terazosin  10 mg Oral Q breakfast   Continuous Infusions: . sodium chloride    . pantoprozole (PROTONIX) infusion 8 mg/hr (03/24/15 0204)  .  sodium bicarbonate  infusion 1000 mL 50 mL/hr at 03/23/15 2038   PRN Meds:.sodium chloride, acetaminophen, diazepam, hydrALAZINE, HYDROcodone-acetaminophen, HYDROmorphone (DILAUDID) injection, HYDROmorphone, insulin glargine, polyethylene glycol, sodium chloride, traMADol,  zolpidem  DVT Prophylaxis    SCDs    Lab Results  Component Value Date   PLT 139* 03/24/2015    Antibiotics     Anti-infectives    None          Objective:   Filed Vitals:   03/24/15 0307 03/24/15 0312 03/24/15 0700 03/24/15 0805  BP: 170/41   159/40  Pulse: 55 60  53  Temp:  97.6 F (36.4 C) 97.8 F (36.6 C)   TempSrc:  Oral Oral   Resp: 15 12  11   Height:      Weight:      SpO2: 92% 96%  92%    Wt Readings from Last 3 Encounters:  03/23/15 113.399 kg (250 lb)  03/01/15 120.475 kg (265 lb 9.6 oz)  12/30/14 116.937 kg (257 lb 12.8 oz)     Intake/Output Summary (Last 24 hours) at 03/24/15  0847 Last data filed at 03/24/15 4627  Gross per 24 hour  Intake 318.33 ml  Output   1500 ml  Net -1181.67 ml     Physical Exam  Awake Alert, Oriented X 3, No new F.N deficits, Normal affect [place].AT,PERRAL Supple Neck,No JVD, No cervical lymphadenopathy appriciated.  Symmetrical Chest wall movement, Good air movement bilaterally, CTAB RRR,No Gallops,Rubs or new Murmurs, No Parasternal Heave +ve B.Sounds, Abd Soft, No tenderness, No organomegaly appriciated, No rebound - guarding or rigidity. No Cyanosis, Clubbing or edema, No new Rash or bruise      Data Review   Micro Results Recent Results (from the past 240 hour(s))  MRSA PCR Screening     Status: None   Collection Time: 03/24/15  1:26 AM  Result Value Ref Range Status   MRSA by PCR NEGATIVE NEGATIVE Final    Comment:        The GeneXpert MRSA Assay (FDA approved for NASAL specimens only), is one component of a comprehensive MRSA colonization surveillance program. It is not intended to diagnose MRSA infection nor to guide or monitor treatment for MRSA infections.     Radiology Reports No results found.   CBC  Recent Labs Lab 03/23/15 1943 03/24/15 0240  WBC 4.7 4.0  HGB 12.2*  10.6* 10.7*  HCT 36.0*  31.6* 31.4*  PLT 143* 139*  MCV 87.3 86.5  MCH 29.3 29.5  MCHC 33.5 34.1  RDW 14.8 14.7  LYMPHSABS 0.3*  --   MONOABS 0.4  --   EOSABS 0.1  --   BASOSABS 0.0  --     Chemistries   Recent Labs Lab 03/23/15 1943 03/24/15 0240  NA 124*  123* 129*  K 6.3*  6.2* 5.6*  CL 96  100 97  CO2 11* 17*  GLUCOSE 129*  128* 117*  BUN 121*  119* 114*  CREATININE 7.62*  8.00* 6.89*  CALCIUM 8.8 8.8  AST 16  --   ALT 11  --   ALKPHOS 73  --   BILITOT 0.6  --    ------------------------------------------------------------------------------------------------------------------ estimated creatinine clearance is 12 mL/min (by C-G formula based on Cr of  6.89). ------------------------------------------------------------------------------------------------------------------ No results for input(s): HGBA1C in the last 72 hours. ------------------------------------------------------------------------------------------------------------------ No results for input(s): CHOL, HDL, LDLCALC, TRIG, CHOLHDL, LDLDIRECT in the last 72 hours. ------------------------------------------------------------------------------------------------------------------ No results for input(s): TSH, T4TOTAL, T3FREE, THYROIDAB in the last 72 hours.  Invalid input(s): FREET3 ------------------------------------------------------------------------------------------------------------------ No results for input(s): VITAMINB12, FOLATE, FERRITIN, TIBC, IRON, RETICCTPCT in the last 72 hours.  Coagulation profile No results for input(s): INR, PROTIME in the  last 168 hours.  No results for input(s): DDIMER in the last 72 hours.  Cardiac Enzymes No results for input(s): CKMB, TROPONINI, MYOGLOBIN in the last 168 hours.  Invalid input(s): CK ------------------------------------------------------------------------------------------------------------------ Invalid input(s): POCBNP     Time Spent in minutes   35   SINGH,PRASHANT K M.D on 03/24/2015 at 8:47 AM  Between 7am to 7pm - Pager - [phone]  After 7pm go to [email or web address] - password [facility]  [facility]   Office  [phone]

## 2015-03-24 NOTE — Care Management Note (Unsigned)
    Page 1 of 1   03/24/2015     2:34:47 PM CARE MANAGEMENT NOTE 03/24/2015  Patient:  [name], [name]   Account Number:  [id number]  Date Initiated:  03/24/2015  Documentation initiated by:  Whitman Hero  Subjective/Objective Assessment:   PTA from home with family admitted with acute on chronic renal failure.     Action/Plan:   Return to home when medically stable. CM to f/u with discharge disposition.   Anticipated DC Date:     Anticipated DC Plan:        DC Planning Services  CM consult      Choice offered to / List presented to:             Status of service:  In process, will continue to follow Medicare Important Message given?  NA - LOS <3 / Initial given by admissions (If response is "NO", the following Medicare IM given date fields will be blank) Date Medicare IM given:   Medicare IM given by:   Date Additional Medicare IM given:   Additional Medicare IM given by:    Discharge Disposition:    Per UR Regulation:  Reviewed for med. necessity/level of care/duration of stay  If discussed at [place] of Stay Meetings, dates discussed:    Comments:

## 2015-03-25 DIAGNOSIS — N189 Chronic kidney disease, unspecified: Secondary | ICD-10-CM

## 2015-03-25 DIAGNOSIS — I4891 Unspecified atrial fibrillation: Secondary | ICD-10-CM

## 2015-03-25 DIAGNOSIS — N179 Acute kidney failure, unspecified: Secondary | ICD-10-CM

## 2015-03-25 LAB — CBC
HCT: 30.2 % — ABNORMAL LOW (ref 39.0–52.0)
Hemoglobin: 10.2 g/dL — ABNORMAL LOW (ref 13.0–17.0)
MCH: 29.5 pg (ref 26.0–34.0)
MCHC: 33.8 g/dL (ref 30.0–36.0)
MCV: 87.3 fL (ref 78.0–100.0)
Platelets: 152 10*3/uL (ref 150–400)
RBC: 3.46 MIL/uL — ABNORMAL LOW (ref 4.22–5.81)
RDW: 15 % (ref 11.5–15.5)
WBC: 3.6 10*3/uL — ABNORMAL LOW (ref 4.0–10.5)

## 2015-03-25 LAB — HEMOGLOBIN A1C
HEMOGLOBIN A1C: 6.1 % — AB (ref 4.8–5.6)
MEAN PLASMA GLUCOSE: 128 mg/dL

## 2015-03-25 LAB — RENAL FUNCTION PANEL
ALBUMIN: 3 g/dL — AB (ref 3.5–5.2)
ANION GAP: 7 (ref 5–15)
BUN: 89 mg/dL — ABNORMAL HIGH (ref 6–23)
CHLORIDE: 104 mmol/L (ref 96–112)
CO2: 23 mmol/L (ref 19–32)
Calcium: 8.5 mg/dL (ref 8.4–10.5)
Creatinine, Ser: 5.01 mg/dL — ABNORMAL HIGH (ref 0.50–1.35)
GFR calc Af Amer: 12 mL/min — ABNORMAL LOW (ref >90)
GFR calc non Af Amer: 10 mL/min — ABNORMAL LOW (ref >90)
Glucose, Bld: 112 mg/dL — ABNORMAL HIGH (ref 70–99)
PHOSPHORUS: 6 mg/dL — AB (ref 2.3–4.6)
POTASSIUM: 5.3 mmol/L — AB (ref 3.5–5.1)
Sodium: 134 mmol/L — ABNORMAL LOW (ref 135–145)

## 2015-03-25 LAB — GLUCOSE, CAPILLARY
GLUCOSE-CAPILLARY: 147 mg/dL — AB (ref 70–99)
Glucose-Capillary: 113 mg/dL — ABNORMAL HIGH (ref 70–99)
Glucose-Capillary: 207 mg/dL — ABNORMAL HIGH (ref 70–99)
Glucose-Capillary: 158 mg/dL — ABNORMAL HIGH (ref 70–99)

## 2015-03-25 MED ORDER — DOCUSATE SODIUM 100 MG PO CAPS
100.0000 mg | ORAL_CAPSULE | Freq: Two times a day (BID) | ORAL | Status: DC | PRN
  Administered 2015-03-25: 100 mg via ORAL
  Filled 2015-03-25: qty 1

## 2015-03-25 MED ORDER — PANTOPRAZOLE SODIUM 40 MG PO TBEC
40.0000 mg | DELAYED_RELEASE_TABLET | Freq: Two times a day (BID) | ORAL | Status: DC
  Administered 2015-03-25 – 2015-03-26 (×3): 40 mg via ORAL
  Filled 2015-03-25 (×3): qty 1

## 2015-03-25 NOTE — Progress Notes (Signed)
Patient trasfered from 3S to 5W04 via wheelchair; alert and oriented x 4; no complaints of pain; IV saline locked in RFA and R Shoulder; skin intact - ecchymosis on left flank and right lower back. Orient patient to rooom; instructed how to use the call bell and  fall risk precautions. Will continue to monitor the patient.

## 2015-03-25 NOTE — Progress Notes (Signed)
Pt to [place] to 5W-04, VSS, called report. Family aware of [place].

## 2015-03-25 NOTE — Progress Notes (Signed)
Patient Demographics  [name], is a 74 y.o. male, DOB - 18-Aug-1941, ULA:[id number]  Admit date - 03/23/2015   Admitting Physician Flossie Dibble, MD  Outpatient Primary MD for the patient is FRIED, [name], MD  LOS - 2   Chief Complaint  Patient presents with  . Abnormal Lab      Summary  74 y.o. male with known history of CAD s/p 3 v CABG 2006, AS s/p AVR with bioprosthetic valve in 2012, atrial fibrillation (on anticoagulation with reduced dose xarelto), OA, iDDM, HTN, CKD stage 3-4, recent right rib fracture due to mechanical fall who presented with chief complaint of generalized weakness and significant bradycardia, apparently since his rib fracture he was not eating or drinking well and recently his blood pressure medication was adjusted.  In the ER his workup was consistent with acute renal failure, dehydration, he developed some nausea due to uremia and had 3 episodes of emesis third one had some blood in it. He has been seen by renal, GI and cardiology. Currently improving. Will be moved out of stepdown today.    Subjective:   [name] today has, No headache, No chest pain, No abdominal pain - No Nausea, No new weakness tingling or numbness, No Cough - SOB. Having right-sided rib cage pain.  Assessment & Plan    1. Acute renal failure on CK D stage IV baseline creatinine 2.2 due to combination of dehydration from poor oral intake for the last 3-4 days secondary to rib cage pain, ACE inhibitor on board, recent home blood pressure medication change causing relative hypotension.   Appreciate renal input, improving with with IV fluids which I defer to nephrology, post IV bicarbonate drip, potassium and bicarbonate levels along with renal function are improving. Further workup  per nephrology.     2. Emesis 3 in the ER with some evidence of upper GI bleed. Likely developed nausea and vomiting due to uremia from #1 abov with likely subsequent Mallory-Weiss tear. Bled easily as he was on xaralto. Xaralto on hold, IV PPI, type screen done, H&H stable. GI following.     3. CAD s/p 3 v CABG 2006, AS s/p AVR with bioprosthetic valve in 2012, Chr. atrial fibrillation with [place] S2 VASC score of 5 - cardiology following, xaralto on hold, for now on 81 mg of aspirin if there is evidence of ongoing GI bleed this will be stopped, further input per cardiology, will resume home dose beta blocker.    4. Essential hypertension. On Norvasc, added back Lopressor, as needed IV hydralazine. We will avoid acute drop in blood pressure due to acute renal failure.    5. DM type II. On sliding scale will continue to monitor.   Lab Results  Component Value Date   HGBA1C 6.1* 03/24/2015    CBG (last 3)   Recent Labs  03/24/15 1631 03/24/15 2105 03/25/15 0807  GLUCAP 123* 183* 113*    6. Recent mechanical fall with right-sided rib cage fracture. Supportive care with pain control.     Code Status: Full  Family Communication: None  Disposition Plan: Move out of stepdown, initiate PT increase activity.   Procedures      Consults  Cardiology, GI, Renal   Medications  Scheduled Meds: . amLODipine  10 mg Oral Daily  . aspirin EC  81 mg Oral QHS  . feeding supplement (GLUCERNA SHAKE)  237 mL Oral Q24H  . feeding supplement (RESOURCE BREEZE)  1 Container Oral BID BM  . fentaNYL  50 mcg Transdermal Q72H  . insulin aspart  0-5 Units Subcutaneous QHS  . insulin aspart  0-9 Units Subcutaneous TID WC  . metoprolol tartrate  25 mg Oral BID  . multivitamin with minerals  1 tablet Oral Daily  . pantoprazole  40 mg Oral BID  . sodium chloride  3 mL Intravenous Q12H  . terazosin  10 mg Oral Q breakfast   Continuous Infusions: .  sodium bicarbonate  infusion 1000  mL 50 mL/hr at 03/24/15 2041   PRN Meds:.sodium chloride, acetaminophen, diazepam, hydrALAZINE, HYDROcodone-acetaminophen, HYDROmorphone (DILAUDID) injection, HYDROmorphone, insulin glargine, polyethylene glycol, sodium chloride, traMADol, zolpidem  DVT Prophylaxis    SCDs    Lab Results  Component Value Date   PLT 152 03/25/2015    Antibiotics     Anti-infectives    None          Objective:   Filed Vitals:   03/25/15 0008 03/25/15 0442 03/25/15 0514 03/25/15 0800  BP: 165/46 183/40 168/48 180/51  Pulse: 62 61 54   Temp:  98.4 F (36.9 C)  98.6 F (37 C)  TempSrc:  Oral  Oral  Resp: 15 9 11    Height:  6' (1.829 m)    Weight:  123.968 kg (273 lb 4.8 oz)    SpO2: 88% 94% 94%     Wt Readings from Last 3 Encounters:  03/25/15 123.968 kg (273 lb 4.8 oz)  03/01/15 120.475 kg (265 lb 9.6 oz)  12/30/14 116.937 kg (257 lb 12.8 oz)     Intake/Output Summary (Last 24 hours) at 03/25/15 0928 Last data filed at 03/25/15 0800  Gross per 24 hour  Intake 3382.08 ml  Output   2751 ml  Net 631.08 ml     Physical Exam  Awake Alert, Oriented X 3, No new F.N deficits, Normal affect [place].AT,PERRAL Supple Neck,No JVD, No cervical lymphadenopathy appriciated.  Symmetrical Chest wall movement, Good air movement bilaterally, CTAB RRR,No Gallops,Rubs or new Murmurs, No Parasternal Heave +ve B.Sounds, Abd Soft, No tenderness, No organomegaly appriciated, No rebound - guarding or rigidity. No Cyanosis, Clubbing or edema, No new Rash or bruise      Data Review   Micro Results Recent Results (from the past 240 hour(s))  MRSA PCR Screening     Status: None   Collection Time: 03/24/15  1:26 AM  Result Value Ref Range Status   MRSA by PCR NEGATIVE NEGATIVE Final    Comment:        The GeneXpert MRSA Assay (FDA approved for NASAL specimens only), is one component of a comprehensive MRSA colonization surveillance program. It is not intended to diagnose MRSA infection nor to  guide or monitor treatment for MRSA infections.     Radiology Reports No results found.   CBC  Recent Labs Lab 03/23/15 1943 03/24/15 0240 03/24/15 1251 03/25/15 0342  WBC 4.7 4.0  --  3.6*  HGB 12.2*  10.6* 10.7* 10.5* 10.2*  HCT 36.0*  31.6* 31.4* 31.1* 30.2*  PLT 143* 139*  --  152  MCV 87.3 86.5  --  87.3  MCH 29.3 29.5  --  29.5  MCHC 33.5 34.1  --  33.8  RDW 14.8 14.7  --  15.0  LYMPHSABS 0.3*  --   --   --  MONOABS 0.4  --   --   --   EOSABS 0.1  --   --   --   BASOSABS 0.0  --   --   --     Chemistries   Recent Labs Lab 03/23/15 1943 03/24/15 0240 03/24/15 1650 03/25/15 0342  NA 124*  123* 129* 133* 134*  K 6.3*  6.2* 5.6* 6.0* 5.3*  CL 96  100 97 104 104  CO2 11* 17* 18* 23  GLUCOSE 129*  128* 117* 108* 112*  BUN 121*  119* 114* 104* 89*  CREATININE 7.62*  8.00* 6.89* 5.80* 5.01*  CALCIUM 8.8 8.8 8.3* 8.5  AST 16  --   --   --   ALT 11  --   --   --   ALKPHOS 73  --   --   --   BILITOT 0.6  --   --   --    ------------------------------------------------------------------------------------------------------------------ estimated creatinine clearance is 17.3 mL/min (by C-G formula based on Cr of 5.01). ------------------------------------------------------------------------------------------------------------------  Recent Labs  03/24/15 0930  HGBA1C 6.1*   ------------------------------------------------------------------------------------------------------------------ No results for input(s): CHOL, HDL, LDLCALC, TRIG, CHOLHDL, LDLDIRECT in the last 72 hours. ------------------------------------------------------------------------------------------------------------------ No results for input(s): TSH, T4TOTAL, T3FREE, THYROIDAB in the last 72 hours.  Invalid input(s): FREET3 ------------------------------------------------------------------------------------------------------------------  Recent Labs  03/24/15 0240  FERRITIN 87   TIBC 312  IRON 32*    Coagulation profile  Recent Labs Lab 03/24/15 0816  INR 1.44    No results for input(s): DDIMER in the last 72 hours.  Cardiac Enzymes No results for input(s): CKMB, TROPONINI, MYOGLOBIN in the last 168 hours.  Invalid input(s): CK ------------------------------------------------------------------------------------------------------------------ Invalid input(s): POCBNP     Time Spent in minutes   35   SINGH,PRASHANT K M.D on 03/25/2015 at 9:28 AM  Between 7am to 7pm - Pager - [phone]  After 7pm go to [email or web address] - password [facility]  [facility]   Office  [phone]

## 2015-03-25 NOTE — Evaluation (Signed)
Physical Therapy Evaluation Patient Details Name: [name] LETOURNEAU, MD MRN: [id number] DOB: 1940/12/26 Today's Date: 03/25/2015   History of Present Illness  This is a 74 y.o. male with known history of CAD s/p 3 v CABG 2006, AS s/p AVR with bioprosthetic valve in 2012, atrial fibrillation (on anticoagulation with reduced dose xarelto), OA, iDDM, HTN, CKD stage 3-4, recent right rib fracture due to mechanical fall who presented today with chief complaint of generalized weakness and significant bradycardia. Patient was in usual state of health until yesterday when patient developed generalized weakness. He came to the ED where he was found to have elevated creatinine of 8 and HR in 20-30s with wide QRS which is his baseline. Since his recent fall and rib fracture he has not been eating well due to pain. He has been taking his pain medications regularly but he has reduced the dose of dilaudid to 2 mg Q4 hours which he said that is helping him. Later during the stay in the ED, he had sudden onset of nausea and had about 20 ml of dark coffee ground emesis. Pt admitted for further evaluation.   Clinical Impression  Pt admitted with above diagnosis. Pt currently with functional limitations due to the deficits listed below (see PT Problem List). At the time of PT eval pt was able to perform transfers and ambulation with close min guard assist. Declined use of RW during session however per wife pt has been using the walker at home for the last week PTA due to progressive weakness. Pt will benefit from skilled PT to increase their independence and safety with mobility to allow discharge to the venue listed below.       Follow Up Recommendations Outpatient PT    Equipment Recommendations  None recommended by PT    Recommendations for Other Services       Precautions / Restrictions Precautions Precautions: Fall Restrictions Weight Bearing Restrictions: No      Mobility  Bed Mobility Overal bed  mobility: Needs Assistance Bed Mobility: Supine to Sit     Supine to sit: Min guard     General bed mobility comments: Guarding for safety. Occasional assist for management of bed linen. Pt trying to get out of bed with "legs" of bed significantly bent up. Pt required cueing to slow down and let the therapist adjust the bed to a flattened position in the legs prior to sitting up.   Transfers Overall transfer level: Needs assistance Equipment used: None Transfers: Sit to/from [facility] Sit to Stand: Min guard Stand pivot transfers: Min guard       General transfer comment: Slow and guarded. Pt with R rib pain and held this area as he stood up. Unsteady with transfer to [facility]  Ambulation/Gait Ambulation/Gait assistance: Min guard Ambulation Distance (Feet): 15 Feet Assistive device: None Gait Pattern/deviations: Step-through pattern;Decreased stride length;Trunk flexed Gait velocity: Decreased Gait velocity interpretation: Below normal speed for age/gender General Gait Details: Pt furniture walking to recliner. Declined use of RW for support. Close guard for safety required.   Stairs            Wheelchair Mobility    Modified Rankin (Stroke Patients Only)       Balance Overall balance assessment: Needs assistance Sitting-balance support: Feet supported;No upper extremity supported Sitting balance-Leahy Scale: Good     Standing balance support: No upper extremity supported Standing balance-Leahy Scale: Poor Standing balance comment: Requires UE support  Pertinent Vitals/Pain Pain Assessment: 0-10 Pain Score: 4  Pain Location: R ribs Pain Descriptors / Indicators: Grimacing;Guarding Pain Intervention(s): Limited activity within patient's tolerance;Monitored during session;Repositioned    Home Living Family/patient expects to be discharged to:: Private residence Living Arrangements: Spouse/significant  other;Children Available Help at Discharge: Family;Available 24 hours/day Type of Home: House Home Access: Stairs to enter Entrance Stairs-Rails: Left Entrance Stairs-Number of Steps: 2 Home Layout: Two level;Able to live on main level with bedroom/bathroom Home Equipment: Gilford Rile - 2 wheels;Cane - single point;Shower seat;Grab bars - toilet;Bedside commode;Transport chair Additional Comments: 1 step into walk in shower. suction up grabbar in shower. handicapped height toilet.    Prior Function Level of Independence: Independent with assistive device(s)         Comments: Still driving      Hand Dominance   Dominant Hand: Right    Extremity/Trunk Assessment   Upper Extremity Assessment: Defer to OT evaluation           Lower Extremity Assessment: Generalized weakness (Grossly 4-/5 bilaterally)      Cervical / Trunk Assessment: Normal  Communication   Communication: No difficulties  Cognition Arousal/Alertness: Awake/alert Behavior During Therapy: WFL for tasks assessed/performed Overall Cognitive Status: Within Functional Limits for tasks assessed                      General Comments      Exercises        Assessment/Plan    PT Assessment Patient needs continued PT services  PT Diagnosis Difficulty walking   PT Problem List Decreased strength;Decreased range of motion;Decreased activity tolerance;Decreased balance;Decreased mobility;Decreased knowledge of use of DME;Decreased knowledge of precautions;Decreased safety awareness;Pain  PT Treatment Interventions DME instruction;Gait training;Stair training;Functional mobility training;Therapeutic activities;Therapeutic exercise;Neuromuscular re-education;Patient/family education   PT Goals (Current goals can be found in the Care Plan section) Acute Rehab PT Goals Patient Stated Goal: Return to PLOF PT Goal Formulation: With patient/family Time For Goal Achievement: 04/01/15 Potential to Achieve Goals:  Good    Frequency Min 3X/week   Barriers to discharge        Co-evaluation               End of Session Equipment Utilized During Treatment: Other (comment) (Gait belt deferred due to rib pain) Activity Tolerance: Patient tolerated treatment well Patient left: in chair;with call bell/phone within reach;with family/visitor present Nurse Communication: Mobility status         Time: 1435-1458 PT Time Calculation (min) (ACUTE ONLY): 23 min   Charges:   PT Evaluation $Initial PT Evaluation Tier I: 1 Procedure PT Treatments $Therapeutic Activity: 8-22 mins   PT G Codes:        Rolinda Roan 11-Apr-2015, 3:15 PM   Rolinda Roan, PT, DPT Acute Rehabilitation Services Pager: [phone]

## 2015-03-25 NOTE — Progress Notes (Signed)
[facility] Progress Note  Subjective: Feels okay except for some rib pain no nausea no bowel movements overnight  Objective: Vital signs in last 24 hours: Temp:  [97.6 F (36.4 C)-98.4 F (36.9 C)] 98.4 F (36.9 C) (04/01 0442) Pulse Rate:  [49-81] 54 (04/01 0514) Resp:  [9-19] 11 (04/01 0514) BP: (146-183)/(30-53) 168/48 mmHg (04/01 0514) SpO2:  [88 %-97 %] 94 % (04/01 0514) FiO2 (%):  [28 %] 28 % (03/31 2051) Weight:  [123.968 kg (273 lb 4.8 oz)] 123.968 kg (273 lb 4.8 oz) (04/01 0442) Weight change: 10.569 kg (23 lb 4.8 oz)   PE: Unchanged  Lab Results: Results for orders placed or performed during the hospital encounter of 03/23/15 (from the past 24 hour(s))  Glucose, capillary     Status: Abnormal   Collection Time: 03/24/15  8:05 AM  Result Value Ref Range   Glucose-Capillary 110 (H) 70 - 99 mg/dL   Comment 1 Notify RN    Comment 2 Document in Chart   Protime-INR     Status: Abnormal   Collection Time: 03/24/15  8:16 AM  Result Value Ref Range   Prothrombin Time 17.7 (H) 11.6 - 15.2 seconds   INR 1.44 0.00 - 1.49  Type and screen     Status: None   Collection Time: 03/24/15  8:16 AM  Result Value Ref Range   ABO/RH(D) O POS    Antibody Screen NEG    Sample Expiration 03/27/2015   Glucose, capillary     Status: Abnormal   Collection Time: 03/24/15 12:19 PM  Result Value Ref Range   Glucose-Capillary 124 (H) 70 - 99 mg/dL  Hemoglobin and hematocrit, blood     Status: Abnormal   Collection Time: 03/24/15 12:51 PM  Result Value Ref Range   Hemoglobin 10.5 (L) 13.0 - 17.0 g/dL   HCT 31.1 (L) 39.0 - 52.0 %  Glucose, capillary     Status: Abnormal   Collection Time: 03/24/15  4:31 PM  Result Value Ref Range   Glucose-Capillary 123 (H) 70 - 99 mg/dL   Comment 1 Notify RN    Comment 2 Document in Chart   Renal function panel     Status: Abnormal   Collection Time: 03/24/15  4:50 PM  Result Value Ref Range   Sodium 133 (L) 135 - 145 mmol/L   Potassium 6.0 (H) 3.5 - 5.1 mmol/L   Chloride 104 96 - 112 mmol/L   CO2 18 (L) 19 - 32 mmol/L   Glucose, Bld 108 (H) 70 - 99 mg/dL   BUN 104 (H) 6 - 23 mg/dL   Creatinine, Ser 5.80 (H) 0.50 - 1.35 mg/dL   Calcium 8.3 (L) 8.4 - 10.5 mg/dL   Phosphorus 7.1 (H) 2.3 - 4.6 mg/dL   Albumin 3.0 (L) 3.5 - 5.2 g/dL   GFR calc non Af Amer 9 (L) >90 mL/min   GFR calc Af Amer 10 (L) >90 mL/min   Anion gap 11 5 - 15  Glucose, capillary     Status: Abnormal   Collection Time: 03/24/15  9:05 PM  Result Value Ref Range   Glucose-Capillary 183 (H) 70 - 99 mg/dL  CBC     Status: Abnormal   Collection Time: 03/25/15  3:42 AM  Result Value Ref Range   WBC 3.6 (L) 4.0 - 10.5 K/uL   RBC 3.46 (L) 4.22 - 5.81 MIL/uL   Hemoglobin 10.2 (L) 13.0 - 17.0 g/dL   HCT 30.2 (L) 39.0 - 52.0 %  MCV 87.3 78.0 - 100.0 fL   MCH 29.5 26.0 - 34.0 pg   MCHC 33.8 30.0 - 36.0 g/dL   RDW 15.0 11.5 - 15.5 %   Platelets 152 150 - 400 K/uL  Renal function panel     Status: Abnormal   Collection Time: 03/25/15  3:42 AM  Result Value Ref Range   Sodium 134 (L) 135 - 145 mmol/L   Potassium 5.3 (H) 3.5 - 5.1 mmol/L   Chloride 104 96 - 112 mmol/L   CO2 23 19 - 32 mmol/L   Glucose, Bld 112 (H) 70 - 99 mg/dL   BUN 89 (H) 6 - 23 mg/dL   Creatinine, Ser 5.01 (H) 0.50 - 1.35 mg/dL   Calcium 8.5 8.4 - 10.5 mg/dL   Phosphorus 6.0 (H) 2.3 - 4.6 mg/dL   Albumin 3.0 (L) 3.5 - 5.2 g/dL   GFR calc non Af Amer 10 (L) >90 mL/min   GFR calc Af Amer 12 (L) >90 mL/min   Anion gap 7 5 - 15    Studies/Results: No results found.    Assessment: Nausea vomiting with 1 episode of coffee-ground emesis associated with bradycardia and uremia, suspect Mallory-Weiss tear, no further signs of bleeding  Plan: As long as no further signs of bleeding patient's desire is to treat empirically which I'm comfortable with. We'll continue proton pump inhibitor which I would use for at least 6 weeks. We will advance to full liquid diet. We'll  continue to monitor stools and hemoglobin.    [name] C 03/25/2015, 7:36 AM

## 2015-03-25 NOTE — Progress Notes (Signed)
[facility] Rounding Note Subjective:   Rib pain No SOB Still on isotonic bicarb at 50/hour EXCELLENT UOP past 24 hours - 3.2 liters Able to hydrate po Has developed some worsening LE edema   Objective Vital signs in last 24 hours: Filed Vitals:   03/25/15 0514 03/25/15 0800 03/25/15 0957 03/25/15 1300  BP: 168/48 180/51 184/51 159/55  Pulse: 54  68 49  Temp:  98.6 F (37 C) 98.1 F (36.7 C) 97.8 F (36.6 C)  TempSrc:  Oral Oral Oral  Resp: 11  18   Height:      Weight:      SpO2: 94%  94% 95%   Weight change: 10.569 kg (23 lb 4.8 oz)  Intake/Output Summary (Last 24 hours) at 03/25/15 1420 Last data filed at 03/25/15 1414  Gross per 24 hour  Intake   3165 ml  Output   2300 ml  Net    865 ml     Physical Exam:  BP 159/55 mmHg  Pulse 49  Temp(Src) 97.8 F (36.6 C) (Oral)  Resp 18  Ht 6' (1.829 m)  Wt 123.968 kg (273 lb 4.8 oz)  BMI 37.06 kg/m2  SpO2 95% Very pleasant  In no distress  Cannot see his neck veins due to body habitus Lungs clear to the bases Tenderness right lower rib cage area of rib fracture S1S2 No S3 2/6 murmur USB and across precordium.  Apical pansystolic murmur.  No rub Protuberant abdomen + BS not tender 2+ edema bilateral LE's unchanged  Labs: Basic Metabolic Panel:  Recent Labs Lab 03/23/15 1943 03/24/15 0240 03/24/15 1650 03/25/15 0342  NA 124*  123* 129* 133* 134*  K 6.3*  6.2* 5.6* 6.0* 5.3*  CL 96  100 97 104 104  CO2 11* 17* 18* 23  GLUCOSE 129*  128* 117* 108* 112*  BUN 121*  119* 114* 104* 89*  CREATININE 7.62*  8.00* 6.89* 5.80* 5.01*  CALCIUM 8.8 8.8 8.3* 8.5  PHOS  --   --  7.1* 6.0*      Recent Labs Lab 03/23/15 1943 03/24/15 1650 03/25/15 0342  AST 16  --   --   ALT 11  --   --   ALKPHOS 73  --   --   BILITOT 0.6  --   --   PROT 6.5  --   --   ALBUMIN 3.6 3.0* 3.0*     Recent Labs Lab 03/23/15 1943 03/24/15 0240 03/24/15 1251 03/25/15 0342  WBC 4.7 4.0  --  3.6*   NEUTROABS 3.8  --   --   --   HGB 12.2*  10.6* 10.7* 10.5* 10.2*  HCT 36.0*  31.6* 31.4* 31.1* 30.2*  MCV 87.3 86.5  --  87.3  PLT 143* 139*  --  152     Recent Labs Lab 03/24/15 1219 03/24/15 1631 03/24/15 2105 03/25/15 0807 03/25/15 1158  GLUCAP 124* 123* 183* 113* 207*    Medications: .  sodium bicarbonate  infusion 1000 mL 50 mL/hr at 03/24/15 2041   . amLODipine  10 mg Oral Daily  . aspirin EC  81 mg Oral QHS  . feeding supplement (GLUCERNA SHAKE)  237 mL Oral Q24H  . feeding supplement (RESOURCE BREEZE)  1 Container Oral BID BM  . fentaNYL  50 mcg Transdermal Q72H  . insulin aspart  0-5 Units Subcutaneous QHS  . insulin aspart  0-9 Units Subcutaneous TID WC  . metoprolol tartrate  25 mg Oral BID  .  multivitamin with minerals  1 tablet Oral Daily  . pantoprazole  40 mg Oral BID  . sodium chloride  3 mL Intravenous Q12H  . terazosin  10 mg Oral Q breakfast   Assessment: 1. Acute Kidney Injury - Multifactorial from ischemic ATN secondary to hypotension + lisinopril + volume depletion. He is improving with IV fluids as isotonic bicarb. K is falling and bicarb is improving.  He has avoided the need for dialysis. I think we can stop his IVF and continue to observe exam, renal function and electrolytes.  2. Hypotension - Resolved.  BP elevated today. On amlodipine and metoprolol (the latter just started back today). Do not want to restart ACE in the midst of AKI episode.  3. Diabetes 4. H/o aortic valve replacement + CASHD. 5. H/o Sarcoid 6. CAD/prior CABG/prosthetic AoV (xarelto on hold)   Jamal Maes, MD [facility] [phone] pager 03/25/2015, 2:20 PM

## 2015-03-25 NOTE — Progress Notes (Signed)
SUBJECTIVE:  No distress.  Breathing OK.  Positive rib pain.   PHYSICAL EXAM Filed Vitals:   03/25/15 0008 03/25/15 0442 03/25/15 0514 03/25/15 0800  BP: 165/46 183/40 168/48 180/51  Pulse: 62 61 54   Temp:  98.4 F (36.9 C)    TempSrc:  Oral    Resp: 15 9 11    Height:  6' (1.829 m)    Weight:  273 lb 4.8 oz (123.968 kg)    SpO2: 88% 94% 94%    General:  No distress Lungs:  clear Heart:  RRR, systolic murmur Abdomen:  Positive bowel sounds, no rebound no guarding Extremities:  Mild edema   LABS:  Results for orders placed or performed during the hospital encounter of 03/23/15 (from the past 24 hour(s))  Hemoglobin A1c     Status: Abnormal   Collection Time: 03/24/15  9:30 AM  Result Value Ref Range   Hgb A1c MFr Bld 6.1 (H) 4.8 - 5.6 %   Mean Plasma Glucose 128 mg/dL  Glucose, capillary     Status: Abnormal   Collection Time: 03/24/15 12:19 PM  Result Value Ref Range   Glucose-Capillary 124 (H) 70 - 99 mg/dL  Hemoglobin and hematocrit, blood     Status: Abnormal   Collection Time: 03/24/15 12:51 PM  Result Value Ref Range   Hemoglobin 10.5 (L) 13.0 - 17.0 g/dL   HCT 31.1 (L) 39.0 - 52.0 %  Glucose, capillary     Status: Abnormal   Collection Time: 03/24/15  4:31 PM  Result Value Ref Range   Glucose-Capillary 123 (H) 70 - 99 mg/dL   Comment 1 Notify RN    Comment 2 Document in Chart   Renal function panel     Status: Abnormal   Collection Time: 03/24/15  4:50 PM  Result Value Ref Range   Sodium 133 (L) 135 - 145 mmol/L   Potassium 6.0 (H) 3.5 - 5.1 mmol/L   Chloride 104 96 - 112 mmol/L   CO2 18 (L) 19 - 32 mmol/L   Glucose, Bld 108 (H) 70 - 99 mg/dL   BUN 104 (H) 6 - 23 mg/dL   Creatinine, Ser 5.80 (H) 0.50 - 1.35 mg/dL   Calcium 8.3 (L) 8.4 - 10.5 mg/dL   Phosphorus 7.1 (H) 2.3 - 4.6 mg/dL   Albumin 3.0 (L) 3.5 - 5.2 g/dL   GFR calc non Af Amer 9 (L) >90 mL/min   GFR calc Af Amer 10 (L) >90 mL/min   Anion gap 11 5 - 15  Glucose, capillary     Status:  Abnormal   Collection Time: 03/24/15  9:05 PM  Result Value Ref Range   Glucose-Capillary 183 (H) 70 - 99 mg/dL  CBC     Status: Abnormal   Collection Time: 03/25/15  3:42 AM  Result Value Ref Range   WBC 3.6 (L) 4.0 - 10.5 K/uL   RBC 3.46 (L) 4.22 - 5.81 MIL/uL   Hemoglobin 10.2 (L) 13.0 - 17.0 g/dL   HCT 30.2 (L) 39.0 - 52.0 %   MCV 87.3 78.0 - 100.0 fL   MCH 29.5 26.0 - 34.0 pg   MCHC 33.8 30.0 - 36.0 g/dL   RDW 15.0 11.5 - 15.5 %   Platelets 152 150 - 400 K/uL  Renal function panel     Status: Abnormal   Collection Time: 03/25/15  3:42 AM  Result Value Ref Range   Sodium 134 (L) 135 - 145 mmol/L   Potassium  5.3 (H) 3.5 - 5.1 mmol/L   Chloride 104 96 - 112 mmol/L   CO2 23 19 - 32 mmol/L   Glucose, Bld 112 (H) 70 - 99 mg/dL   BUN 89 (H) 6 - 23 mg/dL   Creatinine, Ser 5.01 (H) 0.50 - 1.35 mg/dL   Calcium 8.5 8.4 - 10.5 mg/dL   Phosphorus 6.0 (H) 2.3 - 4.6 mg/dL   Albumin 3.0 (L) 3.5 - 5.2 g/dL   GFR calc non Af Amer 10 (L) >90 mL/min   GFR calc Af Amer 12 (L) >90 mL/min   Anion gap 7 5 - 15  Glucose, capillary     Status: Abnormal   Collection Time: 03/25/15  8:07 AM  Result Value Ref Range   Glucose-Capillary 113 (H) 70 - 99 mg/dL    Intake/Output Summary (Last 24 hours) at 03/25/15 0851 Last data filed at 03/25/15 0800  Gross per 24 hour  Intake 3202.08 ml  Output   2751 ml  Net 451.08 ml     ASSESSMENT AND PLAN:  Atrial fib:  Bradycardia.  Xarelto on hold with CKD and evidence of GI bleeding.  We will restart once creat improves.  GI is not planning a work up.  I suspect that it will be weeks before he can start a NOAC.  I will ask GI to comment if it is OK to start warfarin.  ARF:    Creat is slightly better again today  CAD/CABG/AVR:   No acute coronary event suspected.    Jeneen Rinks [facility] 03/25/2015 8:51 AM

## 2015-03-26 DIAGNOSIS — K922 Gastrointestinal hemorrhage, unspecified: Secondary | ICD-10-CM

## 2015-03-26 DIAGNOSIS — K920 Hematemesis: Secondary | ICD-10-CM

## 2015-03-26 DIAGNOSIS — R0781 Pleurodynia: Secondary | ICD-10-CM

## 2015-03-26 DIAGNOSIS — R110 Nausea: Secondary | ICD-10-CM

## 2015-03-26 LAB — BASIC METABOLIC PANEL
Anion gap: 5 (ref 5–15)
BUN: 67 mg/dL — AB (ref 6–23)
CO2: 25 mmol/L (ref 19–32)
CREATININE: 3.57 mg/dL — AB (ref 0.50–1.35)
Calcium: 8.7 mg/dL (ref 8.4–10.5)
Chloride: 106 mmol/L (ref 96–112)
GFR calc Af Amer: 18 mL/min — ABNORMAL LOW (ref >90)
GFR, EST NON AFRICAN AMERICAN: 15 mL/min — AB (ref >90)
Glucose, Bld: 118 mg/dL — ABNORMAL HIGH (ref 70–99)
Potassium: 5.1 mmol/L (ref 3.5–5.1)
SODIUM: 136 mmol/L (ref 135–145)

## 2015-03-26 LAB — CBC
HCT: 31.4 % — ABNORMAL LOW (ref 39.0–52.0)
HEMOGLOBIN: 10.5 g/dL — AB (ref 13.0–17.0)
MCH: 29.7 pg (ref 26.0–34.0)
MCHC: 33.4 g/dL (ref 30.0–36.0)
MCV: 88.7 fL (ref 78.0–100.0)
Platelets: 147 10*3/uL — ABNORMAL LOW (ref 150–400)
RBC: 3.54 MIL/uL — ABNORMAL LOW (ref 4.22–5.81)
RDW: 15.1 % (ref 11.5–15.5)
WBC: 4 10*3/uL (ref 4.0–10.5)

## 2015-03-26 LAB — GLUCOSE, CAPILLARY
Glucose-Capillary: 104 mg/dL — ABNORMAL HIGH (ref 70–99)
Glucose-Capillary: 137 mg/dL — ABNORMAL HIGH (ref 70–99)

## 2015-03-26 MED ORDER — AMLODIPINE BESYLATE 10 MG PO TABS: 10.0000 mg | ORAL_TABLET | Freq: Every day | ORAL | Status: DC

## 2015-03-26 MED ORDER — SENNOSIDES-DOCUSATE SODIUM 8.6-50 MG PO TABS: 2.0000 | ORAL_TABLET | Freq: Two times a day (BID) | ORAL | Status: DC | PRN

## 2015-03-26 MED ORDER — PANTOPRAZOLE SODIUM 40 MG PO TBEC: 40.0000 mg | DELAYED_RELEASE_TABLET | Freq: Two times a day (BID) | ORAL | Status: DC

## 2015-03-26 MED ORDER — METOPROLOL TARTRATE 25 MG PO TABS: 25.0000 mg | ORAL_TABLET | Freq: Two times a day (BID) | ORAL | Status: DC

## 2015-03-26 MED ORDER — FENTANYL 50 MCG/HR TD PT72: 50.0000 ug | MEDICATED_PATCH | TRANSDERMAL | Status: DC

## 2015-03-26 MED ORDER — POLYETHYLENE GLYCOL 3350 17 G PO PACK: 17.0000 g | PACK | Freq: Every day | ORAL | Status: DC | PRN

## 2015-03-26 NOTE — Discharge Summary (Signed)
Physician Discharge Summary  [name] Fiddler, MD SFK:[id number] DOB: April 11, 1941 DOA: 03/23/2015  PCP: [name], MD  Admit date: 03/23/2015 Discharge date: 03/26/2015  Time spent: >35 minutes  Recommendations for Outpatient Follow-up:  F/u with nephrology on Monday  F/u with cardiology next week  F/u with PCP in 1-3 days as needed   Discharge Diagnoses:  Principal Problem:   Acute on chronic renal failure Active Problems:   Atrial fibrillation with controlled ventricular response   Bradycardia, sinus, persistent, severe   Acute hyponatremia   Hyperkalemia   Rib pain on right side   Poor appetite   Acute gastrointestinal bleeding   Nausea & vomiting   Discharge Condition: stable   Diet recommendation: low sodium, DM  Filed Weights   03/23/15 1850 03/25/15 0442 03/26/15 0544  Weight: 113.399 kg (250 lb) 123.968 kg (273 lb 4.8 oz) 124.83 kg (275 lb 3.2 oz)    History of present illness:  74 y.o. male with known history of CAD s/p 3 v CABG 2006, AS s/p AVR with bioprosthetic valve in 2012, atrial fibrillation (on anticoagulation with reduced dose xarelto), OA, iDDM, HTN, CKD stage 3-4, recent right rib fracture due to mechanical fall who presented with chief complaint of generalized weakness and significant bradycardia, apparently since his rib fracture he was not eating or drinking well and recently his blood pressure medication was adjusted.  In the ER his workup was consistent with acute renal failure, dehydration, he developed some nausea due to uremia and had 3 episodes of emesis third one had some blood in it. He has been seen by renal, GI and cardiology.    Hospital Course:  1. Acute renal failure on CK D stage IV baseline creatinine 2.2 due to combination of dehydration from poor oral intake for the last 3-4 days secondary to rib cage pain, ACE inhibitor on board, recent home blood pressure medication change causing relative hypotension. -renal function has  improved with IV fluids; s/p IV bicarbonate drip; Cr today 3.5; we will hold ACE, per nephrology recommended to f/u on Monday to recheck renal function   2. Emesis 3 in the ER with some evidence of upper GI bleed. Likely developed nausea and vomiting due to uremia from #1 abov with likely subsequent Mallory-Weiss tear. Bled easily as he was on xaralto. Xaralto on hold -Hg is stable; no new episodes of bleeding; no plans for endoscopy per GI  3. CAD s/p 3 v CABG 2006, AS s/p AVR with bioprosthetic valve in 2012, Chr. atrial fibrillation with [place] S2 VASC score of 5  -per cardiology: xaralto on hold, for now on 81 mg of aspirin, plan to resume xarelto in few days if renal function continues to improve; management per cardiology as outpatient, to f/u next week  4. Essential hypertension. On Norvasc, restarted Lopressor, cont hydralazine, terazosin. Holding ACE 5. DM type II. ha1c-6.1; resume home regimen  6. recent right rib fracture due to mechanical fall; started on fentanyl patch per patient request     Procedures:  none (i.e. Studies not automatically included, echos, thoracentesis, etc; not x-rays)  Consultations:  GI  Cardiology  Nephrology     Discharge Exam: Filed Vitals:   03/26/15 0512  BP: 189/57  Pulse: 60  Temp: 98 F (36.7 C)  Resp: 18    General: alert, oriented  Cardiovascular: F7,C9 systolic mr/chronic  Respiratory: CTA BL  Discharge Instructions  Discharge Instructions    Diet - low sodium heart healthy  Complete by:  As directed      Discharge instructions    Complete by:  As directed   Please follow up with nephrology on Monday  Please follow up with cardiology next week     Increase activity slowly    Complete by:  As directed             Medication List    STOP taking these medications        HYDROmorphone 2 MG tablet  Commonly known as:  DILAUDID     HYDROmorphone 4 MG tablet  Commonly known as:  DILAUDID     lisinopril 40 MG  tablet  Commonly known as:  PRINIVIL,ZESTRIL     XARELTO 15 MG Tabs tablet  Generic drug:  Rivaroxaban      TAKE these medications        acetaminophen 500 MG tablet  Commonly known as:  TYLENOL  Take 1,000 mg by mouth every 6 (six) hours as needed (pain).     allopurinol 100 MG tablet  Commonly known as:  ZYLOPRIM  Take 200 mg by mouth daily with breakfast.     amLODipine 10 MG tablet  Commonly known as:  NORVASC  Take 1 tablet (10 mg total) by mouth daily.     aspirin EC 81 MG tablet  Take 81 mg by mouth at bedtime.     CALCIUM-MAGNESIUM-VITAMIN D ER PO  Take 1 tablet by mouth daily. Calcium citrate 200 mg, magnesium 100 mg, Vitamin D 200 units     diazepam 10 MG tablet  Commonly known as:  VALIUM  Take 1 tablet (10 mg total) by mouth at bedtime. FOR BLOOD PRESSURE     fentaNYL 50 MCG/HR  Commonly known as:  DURAGESIC - dosed mcg/hr  Place 1 patch (50 mcg total) onto the skin every 3 (three) days.     hydrALAZINE 25 MG tablet  Commonly known as:  APRESOLINE  Take 25 mg by mouth 2 (two) times daily.     insulin glargine 100 unit/mL Sopn  Commonly known as:  LANTUS  Inject 2-3 Units into the skin at bedtime as needed (CBG >120). CBG 120-140 2 units, 140-160 3 units     isosorbide dinitrate 20 MG tablet  Commonly known as:  ISORDIL  Take 20 mg by mouth 2 (two) times daily.     metoprolol tartrate 25 MG tablet  Commonly known as:  LOPRESSOR  Take 1 tablet (25 mg total) by mouth 2 (two) times daily.     ONETOUCH VERIO test strip  Generic drug:  glucose blood  2 (two) times daily. use for testing     pantoprazole 40 MG tablet  Commonly known as:  PROTONIX  Take 1 tablet (40 mg total) by mouth 2 (two) times daily.     polyethylene glycol packet  Commonly known as:  MIRALAX / GLYCOLAX  Take 17 g by mouth daily as needed for mild constipation.     senna-docusate 8.6-50 MG per tablet  Commonly known as:  Senokot-S  Take 2 tablets by mouth 2 (two) times daily  between meals as needed for mild constipation.     terazosin 10 MG capsule  Commonly known as:  HYTRIN  Take 10 mg by mouth daily with breakfast.     testosterone cypionate 200 MG/ML injection  Commonly known as:  DEPOTESTOTERONE CYPIONATE  Inject 16ml intramuscularly as directed twice a month     traMADol 50 MG tablet  Commonly known as:  ULTRAM  Take 50-100 mg by mouth every 4 (four) hours as needed (pain).     zolpidem 10 MG tablet  Commonly known as:  AMBIEN  Take 10 mg by mouth at bedtime as needed for sleep.       Allergies  Allergen Reactions  . Actos [Pioglitazone Hydrochloride] Swelling and Other (See Comments)    Severe peripheral edema  . Penicillins Swelling and Other (See Comments)    Serum sickness  . Amiodarone Other (See Comments)    diarrhea  . Latex Swelling  . Codeine Other (See Comments)    Makes Pt aggressive  . Depakote [Divalproex Sodium] Diarrhea    severe  . Januvia [Sitagliptin] Diarrhea and Other (See Comments)    bradycardia  . Loop Diuretics Other (See Comments)    Spike in BUN levels  . Losartan Other (See Comments)    aphysia  . Nsaids Other (See Comments)    Renal problems  . Onglyza [Saxagliptin] Diarrhea and Other (See Comments)    bradycardia  . Orudis [Ketoprofen] Other (See Comments)    Cannot take due to renal insufficiency  . Pentazocine Lactate Other (See Comments)    Unknown allergic reaction - pt and wife do not recall this  . Rozerem [Ramelteon] Diarrhea    Severe   . Sertraline Hcl Other (See Comments)    Pt and wife do not recall this  . Benazepril Hcl Other (See Comments)    ? Possible lowers plateletes  . Indomethacin Other (See Comments)    Renal problems   . Minocycline Other (See Comments)    Makes dizzy and feel just lousy.  . Poison Ivy Extract [Extract Of Poison Ivy] Rash and Other (See Comments)    blisters  . Potassium Iodide Itching and Rash  . Shellfish Allergy Rash       Follow-up Information     Follow up with FRIED, ROBERT L, MD In 2 days.   Specialty:  Family Medicine   Contact information:   [place] [address] [phone]       Follow up with DUNHAM,CYNTHIA B, MD In 2 days.   Specialty:  Nephrology   Contact information:   [place] [address] [phone]       Follow up with Minus Breeding, MD In 4 days.   Specialty:  Cardiology   Contact information:   [address] [place] [address] [phone]        The results of significant diagnostics from this hospitalization (including imaging, microbiology, ancillary and laboratory) are listed below for reference.    Significant Diagnostic Studies: No results found.  Microbiology: Recent Results (from the past 240 hour(s))  MRSA PCR Screening     Status: None   Collection Time: 03/24/15  1:26 AM  Result Value Ref Range Status   MRSA by PCR NEGATIVE NEGATIVE Final    Comment:        The GeneXpert MRSA Assay (FDA approved for NASAL specimens only), is one component of a comprehensive MRSA colonization surveillance program. It is not intended to diagnose MRSA infection nor to guide or monitor treatment for MRSA infections.      Labs: Basic Metabolic Panel:  Recent Labs Lab 03/23/15 1943 03/24/15 0240 03/24/15 1650 03/25/15 0342 03/26/15 0608  NA 124*  123* 129* 133* 134* 136  K 6.3*  6.2* 5.6* 6.0* 5.3* 5.1  CL 96  100 97 104 104 106  CO2 11*  17* 18* 23 25  GLUCOSE 129*  128* 117* 108* 112* 118*  BUN 121*  119* 114* 104* 89* 67*  CREATININE 7.62*  8.00* 6.89* 5.80* 5.01* 3.57*  CALCIUM 8.8 8.8 8.3* 8.5 8.7  PHOS  --   --  7.1* 6.0*  --    Liver Function Tests:  Recent Labs Lab 03/23/15 1943 03/24/15 1650 03/25/15 0342  AST 16  --   --   ALT 11  --   --   ALKPHOS 73  --   --   BILITOT 0.6  --   --   PROT 6.5  --   --   ALBUMIN 3.6 3.0* 3.0*   No results for input(s): LIPASE, AMYLASE in the last 168 hours. No  results for input(s): AMMONIA in the last 168 hours. CBC:  Recent Labs Lab 03/23/15 1943 03/24/15 0240 03/24/15 1251 03/25/15 0342 03/26/15 0608  WBC 4.7 4.0  --  3.6* 4.0  NEUTROABS 3.8  --   --   --   --   HGB 12.2*  10.6* 10.7* 10.5* 10.2* 10.5*  HCT 36.0*  31.6* 31.4* 31.1* 30.2* 31.4*  MCV 87.3 86.5  --  87.3 88.7  PLT 143* 139*  --  152 147*   Cardiac Enzymes: No results for input(s): CKTOTAL, CKMB, CKMBINDEX, TROPONINI in the last 168 hours. BNP: BNP (last 3 results) No results for input(s): BNP in the last 8760 hours.  ProBNP (last 3 results) No results for input(s): PROBNP in the last 8760 hours.  CBG:  Recent Labs Lab 03/25/15 1158 03/25/15 1628 03/25/15 2146 03/26/15 0748 03/26/15 1152  GLUCAP 207* 158* 147* 104* 137*       Signed:  Joanna Borawski N  [facility] 03/26/2015, 12:52 PM

## 2015-03-26 NOTE — Progress Notes (Signed)
[facility] Progress Note  Subjective: Patient has no specific GI complaints at this time. He has not had any further vomiting or gastrointestinal bleeding. He seems to be tolerating his diet and would like it advanced to a regular diet.  Objective: Vital signs in last 24 hours: Temp:  [97.8 F (36.6 C)-98.2 F (36.8 C)] 98 F (36.7 C) (04/02 0512) Pulse Rate:  [49-66] 60 (04/02 0512) Resp:  [16-18] 18 (04/02 0512) BP: (159-189)/(54-57) 189/57 mmHg (04/02 0512) SpO2:  [95 %-96 %] 96 % (04/02 0512) Weight:  [124.83 kg (275 lb 3.2 oz)] 124.83 kg (275 lb 3.2 oz) (04/02 0544) Weight change: 0.862 kg (1 lb 14.4 oz)   PE:  No distress  Heart regular rhythm  Abdomen soft nontender  Lab Results: Results for orders placed or performed during the hospital encounter of 03/23/15 (from the past 24 hour(s))  Glucose, capillary     Status: Abnormal   Collection Time: 03/25/15 11:58 AM  Result Value Ref Range   Glucose-Capillary 207 (H) 70 - 99 mg/dL  Glucose, capillary     Status: Abnormal   Collection Time: 03/25/15  4:28 PM  Result Value Ref Range   Glucose-Capillary 158 (H) 70 - 99 mg/dL  Glucose, capillary     Status: Abnormal   Collection Time: 03/25/15  9:46 PM  Result Value Ref Range   Glucose-Capillary 147 (H) 70 - 99 mg/dL  CBC     Status: Abnormal   Collection Time: 03/26/15  6:08 AM  Result Value Ref Range   WBC 4.0 4.0 - 10.5 K/uL   RBC 3.54 (L) 4.22 - 5.81 MIL/uL   Hemoglobin 10.5 (L) 13.0 - 17.0 g/dL   HCT 31.4 (L) 39.0 - 52.0 %   MCV 88.7 78.0 - 100.0 fL   MCH 29.7 26.0 - 34.0 pg   MCHC 33.4 30.0 - 36.0 g/dL   RDW 15.1 11.5 - 15.5 %   Platelets 147 (L) 150 - 400 K/uL  Basic metabolic panel     Status: Abnormal   Collection Time: 03/26/15  6:08 AM  Result Value Ref Range   Sodium 136 135 - 145 mmol/L   Potassium 5.1 3.5 - 5.1 mmol/L   Chloride 106 96 - 112 mmol/L   CO2 25 19 - 32 mmol/L   Glucose, Bld 118 (H) 70 - 99 mg/dL   BUN 67 (H) 6 - 23  mg/dL   Creatinine, Ser 3.57 (H) 0.50 - 1.35 mg/dL   Calcium 8.7 8.4 - 10.5 mg/dL   GFR calc non Af Amer 15 (L) >90 mL/min   GFR calc Af Amer 18 (L) >90 mL/min   Anion gap 5 5 - 15  Glucose, capillary     Status: Abnormal   Collection Time: 03/26/15  7:48 AM  Result Value Ref Range   Glucose-Capillary 104 (H) 70 - 99 mg/dL    Studies/Results: No results found.    Assessment: I suspect his vomiting and hematemesis was related to a small Mallory-Weiss tear. His hemoglobin is stable.  Plan: Advance diet. No GI intervention planned. From a GI standpoint he can go home. We will see him again as needed.    Raymonda Pell F 03/26/2015, 11:04 AM

## 2015-03-26 NOTE — Progress Notes (Signed)
[facility] Rounding Note Subjective:   Wants to go home Excellent UOP Creatinine down to 3.57 Pain better Wants fentanyl patches for home BP still high  Objective Vital signs in last 24 hours: Filed Vitals:   03/25/15 2152 03/25/15 2228 03/26/15 0512 03/26/15 0544  BP: 173/54 173/54 189/57   Pulse:  66 60   Temp: 98.2 F (36.8 C)  98 F (36.7 C)   TempSrc: Oral  Oral   Resp: 16  18   Height:      Weight:    124.83 kg (275 lb 3.2 oz)  SpO2: 96%  96%    Weight change: 0.862 kg (1 lb 14.4 oz)  Intake/Output Summary (Last 24 hours) at 03/26/15 1219 Last data filed at 03/26/15 2585  Gross per 24 hour  Intake   1000 ml  Output   2050 ml  Net  -1050 ml     Physical Exam:  BP 189/57 mmHg  Pulse 60  Temp(Src) 98 F (36.7 C) (Oral)  Resp 18  Ht 6' (1.829 m)  Wt 124.83 kg (275 lb 3.2 oz)  BMI 37.32 kg/m2  SpO2 96% Very pleasant  In no distress  Cannot see his neck veins due to body habitus Lungs clear to the bases Tenderness right lower rib cage area of rib fracture S1S2 No S3 2/6 murmur USB and across precordium.  Apical pansystolic murmur.  No rub Protuberant abdomen + BS not tender 2+ edema bilateral LE's unchanged  Labs: Basic Metabolic Panel:  Recent Labs Lab 03/23/15 1943 03/24/15 0240 03/24/15 1650 03/25/15 0342 03/26/15 0608  NA 124*  123* 129* 133* 134* 136  K 6.3*  6.2* 5.6* 6.0* 5.3* 5.1  CL 96  100 97 104 104 106  CO2 11* 17* 18* 23 25  GLUCOSE 129*  128* 117* 108* 112* 118*  BUN 121*  119* 114* 104* 89* 67*  CREATININE 7.62*  8.00* 6.89* 5.80* 5.01* 3.57*  CALCIUM 8.8 8.8 8.3* 8.5 8.7  PHOS  --   --  7.1* 6.0*  --       Recent Labs Lab 03/23/15 1943 03/24/15 1650 03/25/15 0342  AST 16  --   --   ALT 11  --   --   ALKPHOS 73  --   --   BILITOT 0.6  --   --   PROT 6.5  --   --   ALBUMIN 3.6 3.0* 3.0*     Recent Labs Lab 03/23/15 1943 03/24/15 0240 03/24/15 1251 03/25/15 0342 03/26/15 0608  WBC 4.7  4.0  --  3.6* 4.0  NEUTROABS 3.8  --   --   --   --   HGB 12.2*  10.6* 10.7* 10.5* 10.2* 10.5*  HCT 36.0*  31.6* 31.4* 31.1* 30.2* 31.4*  MCV 87.3 86.5  --  87.3 88.7  PLT 143* 139*  --  152 147*     Recent Labs Lab 03/25/15 0807 03/25/15 1158 03/25/15 1628 03/25/15 2146 03/26/15 0748  GLUCAP 113* 207* 158* 147* 104*    Medications:   . amLODipine  10 mg Oral Daily  . aspirin EC  81 mg Oral QHS  . feeding supplement (GLUCERNA SHAKE)  237 mL Oral Q24H  . feeding supplement (RESOURCE BREEZE)  1 Container Oral BID BM  . fentaNYL  50 mcg Transdermal Q72H  . insulin aspart  0-5 Units Subcutaneous QHS  . insulin aspart  0-9 Units Subcutaneous TID WC  . metoprolol tartrate  25 mg Oral  BID  . multivitamin with minerals  1 tablet Oral Daily  . pantoprazole  40 mg Oral BID  . sodium chloride  3 mL Intravenous Q12H  . terazosin  10 mg Oral Q breakfast   Assessment: 1. Acute Kidney Injury - Multifactorial from ischemic ATN secondary to hypotension + lisinopril + volume depletion. Creatinine continues to improve off IVF. K 5.1. Avoided need for dialysis. Bicarb is stable. I think he could go home. He can come by our office on Monday AM for labs and BP check.      2. Hypotension - Resolved.  BP elevated. On amlodipine and metoprolol. Resume hydralazine 25 BID and home dose of isosorbide. Do not restart the ACE.       3. Rib fracture - fentanyl offering good pain control. Needs prescription for discharge and if patch could be changed today he would then have 3 days to get the script filled. 4. Diabetes 5. H/o aortic valve replacement + CASHD. 6. H/o Sarcoid 7. CAD/prior CABG/prosthetic AoV (xarelto on hold) Dr. Mare Ferrari to decide on the xarelto once kidney function better. 8. Anemia Hb stable   Jamal Maes, MD [facility] [phone] pager 03/26/2015, 12:19 PM

## 2015-03-26 NOTE — Progress Notes (Signed)
SUBJECTIVE:  No distress.  Breathing OK.  He wants to go home.    PHYSICAL EXAM Filed Vitals:   03/25/15 2152 03/25/15 2228 03/26/15 0512 03/26/15 0544  BP: 173/54 173/54 189/57   Pulse:  66 60   Temp: 98.2 F (36.8 C)  98 F (36.7 C)   TempSrc: Oral  Oral   Resp: 16  18   Height:      Weight:    275 lb 3.2 oz (124.83 kg)  SpO2: 96%  96%    General:  No distress Lungs:  clear Heart:  RRR, systolic murmur Abdomen:  Positive bowel sounds, no rebound no guarding Extremities:  Mild edema increased.   LABS:  Results for orders placed or performed during the hospital encounter of 03/23/15 (from the past 24 hour(s))  Glucose, capillary     Status: Abnormal   Collection Time: 03/25/15 11:58 AM  Result Value Ref Range   Glucose-Capillary 207 (H) 70 - 99 mg/dL  Glucose, capillary     Status: Abnormal   Collection Time: 03/25/15  4:28 PM  Result Value Ref Range   Glucose-Capillary 158 (H) 70 - 99 mg/dL  Glucose, capillary     Status: Abnormal   Collection Time: 03/25/15  9:46 PM  Result Value Ref Range   Glucose-Capillary 147 (H) 70 - 99 mg/dL  CBC     Status: Abnormal   Collection Time: 03/26/15  6:08 AM  Result Value Ref Range   WBC 4.0 4.0 - 10.5 K/uL   RBC 3.54 (L) 4.22 - 5.81 MIL/uL   Hemoglobin 10.5 (L) 13.0 - 17.0 g/dL   HCT 31.4 (L) 39.0 - 52.0 %   MCV 88.7 78.0 - 100.0 fL   MCH 29.7 26.0 - 34.0 pg   MCHC 33.4 30.0 - 36.0 g/dL   RDW 15.1 11.5 - 15.5 %   Platelets 147 (L) 150 - 400 K/uL  Basic metabolic panel     Status: Abnormal   Collection Time: 03/26/15  6:08 AM  Result Value Ref Range   Sodium 136 135 - 145 mmol/L   Potassium 5.1 3.5 - 5.1 mmol/L   Chloride 106 96 - 112 mmol/L   CO2 25 19 - 32 mmol/L   Glucose, Bld 118 (H) 70 - 99 mg/dL   BUN 67 (H) 6 - 23 mg/dL   Creatinine, Ser 3.57 (H) 0.50 - 1.35 mg/dL   Calcium 8.7 8.4 - 10.5 mg/dL   GFR calc non Af Amer 15 (L) >90 mL/min   GFR calc Af Amer 18 (L) >90 mL/min   Anion gap 5 5 - 15  Glucose,  capillary     Status: Abnormal   Collection Time: 03/26/15  7:48 AM  Result Value Ref Range   Glucose-Capillary 104 (H) 70 - 99 mg/dL    Intake/Output Summary (Last 24 hours) at 03/26/15 0938 Last data filed at 03/26/15 1610  Gross per 24 hour  Intake   1200 ml  Output   2350 ml  Net  -1150 ml     ASSESSMENT AND PLAN:  Atrial fib:  Bradycardia.  Xarelto on hold with CKD and evidence of GI bleeding.   He will need to have follow up of his creat and then we can start the Xarelto.  Since his creat is falling quickly I think that he will be fine not starting warfarin and waiting to be able to restart the Xarelto.    ARF:    Creat down to 3.57  CAD/CABG/AVR:   No acute coronary event suspected.    Jeneen Rinks [facility] 03/26/2015 9:38 AM

## 2015-03-28 ENCOUNTER — Telehealth: Payer: Self-pay | Admitting: Cardiology

## 2015-03-28 NOTE — Telephone Encounter (Signed)
Scheduled appointment for patient later this week, advised wife

## 2015-03-28 NOTE — Telephone Encounter (Signed)
New problem   Pt was in the hospital 3.3.16-4.2.16 and was told at discharge to f/u this week with Dr Mare Ferrari. Pt would like a call back from nurse to accommodate this appt.

## 2015-04-01 ENCOUNTER — Ambulatory Visit (INDEPENDENT_AMBULATORY_CARE_PROVIDER_SITE_OTHER): Payer: Medicare Other | Admitting: Cardiology

## 2015-04-01 ENCOUNTER — Encounter: Payer: Self-pay | Admitting: Cardiology

## 2015-04-01 VITALS — BP 158/62 | HR 50 | Ht 72.0 in | Wt 268.4 lb

## 2015-04-01 DIAGNOSIS — I259 Chronic ischemic heart disease, unspecified: Secondary | ICD-10-CM

## 2015-04-01 DIAGNOSIS — I4891 Unspecified atrial fibrillation: Secondary | ICD-10-CM | POA: Diagnosis not present

## 2015-04-01 NOTE — Progress Notes (Signed)
Cardiology Office Note   Date:  04/01/2015   ID:  [name], MD, DOB 1941-10-08, MRN [id number]  PCP:  [name], MD  Cardiologist:   [name], MD   No chief complaint on file.     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 The patient was admitted on 03/23/15 and discharged on 03/26/15.  His admission was secondary to having had a mechanical fall.  He fell and cracked the right seventh rib.  He initially saw his orthopedist and was treated with tramadol and Dilaudid.  He stopped eating and drinking.  He presented several days later severely dehydrated.  His creatinine was more than 8.  He had an idioventricular heart rhythm and states that in the emergency room his heart rate was down to 24.  Transcutaneous pacing was anticipated but they did not have to use it.  Was given aggressive IV fluid resuscitation and improved.  In the emergency room he vomited 3 times and did have a trace of blood.  He was seen by gastroenterologist Dr. Teena Irani who felt patient may have had a Mallory-Weiss tear from hard vomiting.  He did not require endoscopy.  His Xarelto was held because of his renal failure and his hematemesis.  Following discharge she has felt better.  He is serum creatinine has been improving yesterday his creatinine was down to 2.8 BUN of 42 and his hemoglobin was stable at 10.6.  His estimated GFR is 22. The patient has known ischemic heart disease and had coronary artery bypass graft surgery in 2006. 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 Postoperatively he had atrial fibrillation and was successfully cardioverted in August of 2012. Patient has history of renal insufficiency and is followed by nephrology. He has a history  of arthritis and is followed by Dr. Charlestine Night. His primary care physician is Dr. Maceo Pro. The patient has diabetes and is followed by Dr. Buddy Duty. Patient has a history of osteoarthritis and is followed by Dr. [name]. He has also seen Dr. [name] and he underwent a right total knee replacement on 05/24/2014. His most recent echocardiogram on 05/28/14 showed normal systolic function with ejection fraction 60-65%. There was diastolic dysfunction. There was moderate aortic stenosis with peak gradient of 51 and mean gradient of 29. There was no aortic insufficiency. There was mild mitral stenosis and mild mitral regurgitation and there was mild tricuspid regurgitation. We repeated his echocardiogram on 12/13/14. His ejection fraction was 55-60%. There was diastolic dysfunction. The mean gradient across the aortic valve in systole was 29 which previously had been 36. There was moderate left atrial enlargement. The patient had successful colonoscopy by Dr. Oletta Lamas on January 29, 2014. The patient has been having some symptoms of anxiety and Dr. Casimiro Needle is following him. He is also seeing a psychologist. He has been monitoring his blood pressure. His systolic blood pressures have been running high. He is watching his salt and is not having any peripheral edema.  He has remained in normal sinus rhythm since his cardioversion on 12/30/13.  He has been on long-term anticoagulation with Xarelto He has not been experiencing any chest discomfort. He has had short of breath and fatigue which she attributes partially to an iron deficiency.  He is now on iron supplementation. He has also been having a lot of problems with diarrhea and what appears to be a hyperactive gastrocolic reflex. He takes 2 Lomotil each morning. He is diabetic. He is no longer taking metformin. His nephrologist stopped it because of evidence of some early acidosis.  His lisinopril is also presently on hold because of his renal  insufficiency.   Past Medical History  Diagnosis Date  . Coronary heart disease     stent, CABG, RBBB  . Valvular heart disease     aortic stenosis/regurgitation  . Sarcoid 2011    pulmonary and bone marrow  . Hypercalcemia 2011    due to sarcoidiosis  . Bone marrow disease   . Diabetes mellitus   . Hemorrhagic cystitis 2012  . Gout   . HTN (hypertension)   . A-fib     controlled with medication  . Myocardial infarction     1995, 2002, 2006  . OSA (obstructive sleep apnea)     use bipap setting of 10 and 12  . Complication of anesthesia 11-16-13    required alot of versed per anesthesia with cataract surgery  . Chronic kidney disease (CKD), stage III (moderate)     lov note dr Koleen Nimrod nephrology 09-17-13 on chart  . Heart murmur   . Atrial flutter     hx of  . CHF (congestive heart failure)   . Pneumonia 1966, 2011    hx of  . Anxiety   . Osteoarthritis   . DDD (degenerative disc disease)   . Synovial cyst of lumbar spine     l3-l4, l4-l5, injections  . Anemia   . Hepatitis     mono  . Cancer     basal cell  . Right sciatic nerve pain     for block thursday 01-21-2014, injections  . Fungal toenail infection   . Biceps tendon tear     right  . Diabetes     Past Surgical History  Procedure Laterality Date  . Cataract surgery Right 2007  . Prostate surgery  oct. 2012    TURP  . Vasectomy  1977  . Redo median sternotomy, extracorporeal cirulation, avr, tricuspid valve repair  06/13/2011    AVR(23-mm Edwards pericardial Magna-Ease valve./ TVrepair (34-mm Edwards MC3 annuloplasty ring  . Coronary artery bypass graft  2006    x 5  . Cataract surgery Left 11-16-13  . Cardioversion N/A 12/30/2013    Procedure: CARDIOVERSION;  Surgeon: [name], MD;  Location: [facility] ENDOSCOPY;  Service: Cardiovascular;  Laterality: N/A;  10:49 cardioversion at 120 joules, then 150 joules, to SB  used  Lido 30mg ,  Propofol 160 mcg  . Colonoscopy N/A 01/29/2014    Procedure:  COLONOSCOPY;  Surgeon: Winfield Cunas., MD;  Location: [facility] ENDOSCOPY;  Service: Endoscopy;  Laterality: N/A;  amanda//ja  . Cardioversion  2003, 2006, 2012, 2013  . Portacath placement    . Portacath removed    . Basal cell carcinoma excision Right 2015    ear  . Total knee arthroplasty Right 05/24/2014    Procedure: RIGHT TOTAL KNEE ARTHROPLASTY;  Surgeon: Gearlean Alf, MD;  Location: [facility] ORS;  Service: Orthopedics;  Laterality: Right;     Current Outpatient Prescriptions  Medication Sig Dispense Refill  . Rivaroxaban (XARELTO) 15 MG TABS tablet Take 15 mg by mouth daily with supper.     [name] acetaminophen (TYLENOL) 500 MG tablet Take 1,000 mg by mouth every 6 (six) hours  as needed (pain).     [name] allopurinol (ZYLOPRIM) 100 MG tablet Take 200 mg by mouth daily with breakfast.     . amLODipine (NORVASC) 10 MG tablet Take 1 tablet (10 mg total) by mouth daily. 30 tablet 1  . CALCIUM-MAGNESIUM-VITAMIN D ER PO Take 1 tablet by mouth daily. Calcium citrate 200 mg, magnesium 100 mg, Vitamin D 200 units    . diazepam (VALIUM) 10 MG tablet Take 1 tablet (10 mg total) by mouth at bedtime. FOR BLOOD PRESSURE (Patient taking differently: Take 10 mg by mouth as needed (leg/ankle cramps). FOR BLOOD PRESSURE) 30 tablet 3  . fentaNYL (DURAGESIC - DOSED MCG/HR) 50 MCG/HR Place 1 patch (50 mcg total) onto the skin every 3 (three) days. 5 patch 0  . hydrALAZINE (APRESOLINE) 25 MG tablet Take 25 mg by mouth 2 (two) times daily.  6  . HYDROmorphone (DILAUDID) 2 MG tablet Take 2 mg by mouth every 6 (six) hours as needed. Take 1-2 tablets every 6 hours as needed for pain  0  . insulin glargine (LANTUS) 100 unit/mL SOPN Inject 2-3 Units into the skin at bedtime as needed (CBG >120). CBG 120-140 2 units, 140-160 3 units    . isosorbide dinitrate (ISORDIL) 20 MG tablet Take 20 mg by mouth 2 (two) times daily.  6  . metoprolol tartrate (LOPRESSOR) 25 MG tablet Take 1 tablet (25 mg total) by mouth 2 (two) times daily.  60 tablet 1  . ONETOUCH VERIO test strip 2 (two) times daily. use for testing  6  . pantoprazole (PROTONIX) 40 MG tablet Take 1 tablet (40 mg total) by mouth 2 (two) times daily. 40 tablet 0  . terazosin (HYTRIN) 10 MG capsule Take 10 mg by mouth daily with breakfast.    . testosterone cypionate (DEPOTESTOTERONE CYPIONATE) 200 MG/ML injection Inject 36ml intramuscularly as directed twice a month (Patient taking differently: Inject 200 mg into the muscle every 14 (fourteen) days. Last injection March 16 or 17, 2016) 10 mL 0  . traMADol (ULTRAM) 50 MG tablet Take 50-100 mg by mouth every 4 (four) hours as needed (pain).   0  . zolpidem (AMBIEN) 10 MG tablet Take 10 mg by mouth at bedtime as needed for sleep.      No current facility-administered medications for this visit.    Allergies:   Actos; Penicillins; Amiodarone; Latex; Codeine; Depakote; Januvia; Loop diuretics; Losartan; Nsaids; Onglyza; Orudis; Pentazocine lactate; Rozerem; Sertraline hcl; Benazepril hcl; Indomethacin; Minocycline; Poison ivy extract; Potassium iodide; and Shellfish allergy    Social History:  The patient  reports that he quit smoking about 19 years ago. His smoking use included Pipe and Cigars. He has never used smokeless tobacco. He reports that he drinks alcohol. He reports that he does not use illicit drugs.   Family History:  The patient's family history includes Allergies in an other family member; Asthma in an other family member; Cancer in an other family member; Heart attack in his father; Heart disease in an other family member.    ROS:  Please see the history of present illness.   Otherwise, review of systems are positive for none.   All other systems are reviewed and negative.    PHYSICAL EXAM: VS:  BP 158/62 mmHg  Pulse 50  Ht 6' (1.829 m)  Wt 268 lb 6.4 oz (121.745 kg)  BMI 36.39 kg/m2 , BMI Body mass index is 36.39 kg/(m^2). GEN: Well nourished, well developed, in no acute distress.  Morbid  obesity HEENT: normal Neck: no JVD, carotid bruits, or masses Cardiac: RRR; there is a grade 2/6 systolic ejection murmur across the prosthetic aortic valve.  No diastolic murmur.  There is mild pretibial and pedal edema. Respiratory:  clear to auscultation bilaterally, normal work of breathing GI: soft, nontender, nondistended, + BS MS: no deformity or atrophy Skin: warm and dry, no rash Neuro:  Strength and sensation are intact Psych: euthymic mood, full affect   EKG:  EKG is ordered today. The ekg ordered today demonstrates sinus bradycardia with first-degree AV block and pattern of right bundle branch block and left anterior hemiblock   Recent Labs: 03/23/2015: ALT 11 03/26/2015: BUN 67*; Creatinine 3.57*; Hemoglobin 10.5*; Platelets 147*; Potassium 5.1; Sodium 136    Lipid Panel    Component Value Date/Time   CHOL 106 05/28/2014 0140   TRIG 169* 05/28/2014 0140   HDL 14* 05/28/2014 0140   CHOLHDL 7.6 05/28/2014 0140   VLDL 34 05/28/2014 0140   LDLCALC 58 05/28/2014 0140      Wt Readings from Last 3 Encounters:  04/01/15 268 lb 6.4 oz (121.745 kg)  03/26/15 275 lb 3.2 oz (124.83 kg)  03/01/15 265 lb 9.6 oz (120.475 kg)      Other studies Reviewed: Additional studies/ records that were reviewed today include: Laboratory studies obtained yesterday at Dr. Delman Kitten office Review of the above records demonstrates: Further improvement in renal function   ASSESSMENT AND PLAN:  1. Ischemic heart disease status post CABG in 2006. Ejection fraction 55-60% by echocardiogram 12/13/14  2. History of severe aortic stenosis, with successful prosthetic aortic valve replacement in 2012. Most recent echocardiogram 12/13/14 shows mean gradient of 29 across the prosthetic aortic valve. 3. Past history of atrial fibrillation/flutter, holding normal sinus rhythm. Last cardioversion was in January 2015 4. Status post tricuspid valve repair 2012 5. Diabetes mellitus,type 2 6.  Chronic kidney disease followed by Dr.Coladanato, improving following recent admission for acute on chronic renal insufficiency 7. Essential hypertension   Current medicines are reviewed at length with the patient today.  The patient does not have concerns regarding medicines.  The following changes have been made:  We are resuming his Xarelto in the renal dose of 15 mg daily.  Once he is on Xarelto he no longer needs to take a baby aspirin.  Labs/ tests ordered today include:  No orders of the defined types were placed in this encounter.     Continue other medicines the same.  Keep his June 8 appointment.  Okay for him to go to [place] on April 20 to see his son graduate from graduate school at [place] [employer].  He will be sure to get out of the car every 90 minutes or so to walk around.  Signed, [name], MD  04/01/2015 3:30 PM    [address] [place], [place],   [address] Phone: [phone]; Fax: [phone]

## 2015-04-01 NOTE — Patient Instructions (Signed)
Medication Instructions:  RESTART YOUR XARELTO 15 MG DAILY  STOP YOUR ASPIRIN   Labwork: NONE  Testing/Procedures: NONE  Follow-Up: KEEP YOUR June APPOINTMENT

## 2015-04-04 LAB — H. PYLORI ANTIBODY, IGG

## 2015-04-25 ENCOUNTER — Telehealth: Payer: Self-pay | Admitting: Cardiology

## 2015-04-25 NOTE — Telephone Encounter (Signed)
New message        Pt states he is in afib and would like for melinda to give him a call

## 2015-04-25 NOTE — Telephone Encounter (Signed)
Spoke with patient regarding a fib/flutter. Scheduled ov for tomorrow

## 2015-04-26 ENCOUNTER — Encounter: Payer: Self-pay | Admitting: Cardiology

## 2015-04-26 ENCOUNTER — Ambulatory Visit (INDEPENDENT_AMBULATORY_CARE_PROVIDER_SITE_OTHER): Payer: Medicare Other | Admitting: Cardiology

## 2015-04-26 VITALS — BP 154/62 | HR 53 | Ht 71.0 in | Wt 252.0 lb

## 2015-04-26 DIAGNOSIS — I4891 Unspecified atrial fibrillation: Secondary | ICD-10-CM | POA: Diagnosis not present

## 2015-04-26 DIAGNOSIS — N183 Chronic kidney disease, stage 3 unspecified: Secondary | ICD-10-CM

## 2015-04-26 DIAGNOSIS — Z954 Presence of other heart-valve replacement: Secondary | ICD-10-CM

## 2015-04-26 DIAGNOSIS — Z952 Presence of prosthetic heart valve: Secondary | ICD-10-CM

## 2015-04-26 DIAGNOSIS — I4892 Unspecified atrial flutter: Secondary | ICD-10-CM | POA: Diagnosis not present

## 2015-04-26 DIAGNOSIS — I259 Chronic ischemic heart disease, unspecified: Secondary | ICD-10-CM

## 2015-04-26 NOTE — Progress Notes (Signed)
Cardiology Office Note   Date:  04/26/2015   ID:  [name] Fiddler, MD, DOB [birth year], MRN [id number]  PCP:  [name], MD  Cardiologist: [name] MD  No chief complaint on file.     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 The patient was admitted on 03/23/15 and discharged on 03/26/15. His admission was secondary to having had a mechanical fall. He fell and cracked the right seventh rib. He initially saw his orthopedist and was treated with tramadol and Dilaudid. He stopped eating and drinking. He presented several days later severely dehydrated. His creatinine was more than 8. He had an idioventricular heart rhythm and states that in the emergency room his heart rate was down to 24. Transcutaneous pacing was anticipated but they did not have to use it. Was given aggressive IV fluid resuscitation and improved. In the emergency room he vomited 3 times and did have a trace of blood. He was seen by gastroenterologist Dr. Teena Irani who felt patient may have had a Mallory-Weiss tear from hard vomiting. He did not require endoscopy. His Xarelto was held because of his renal failure and his hematemesis. Following discharge she has felt better. He is serum creatinine has been improving.  On 04/12/15 creatinine was down to 2.38.  He will be getting more labs later this week at Dr. Delman Kitten office. The patient has known ischemic heart disease and had coronary artery bypass graft surgery in 2006. 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 Postoperatively he had atrial fibrillation and was successfully cardioverted in August of 2012. Patient has history of renal insufficiency and is followed by nephrology. He has a history of arthritis and is followed  by Dr. Charlestine Night. His primary care physician is Dr. Maceo Pro. The patient has diabetes and is followed by Dr. Buddy Duty. Patient has a history of osteoarthritis and is followed by Dr. [name]. He has also seen Dr. [name] and he underwent a right total knee replacement on 05/24/2014. His most recent echocardiogram on 05/28/14 showed normal systolic function with ejection fraction 60-65%. There was diastolic dysfunction. There was moderate aortic stenosis with peak gradient of 51 and mean gradient of 29. There was no aortic insufficiency. There was mild mitral stenosis and mild mitral regurgitation and there was mild tricuspid regurgitation. We repeated his echocardiogram on 12/13/14. His ejection fraction was 55-60%. There was diastolic dysfunction. The mean gradient across the aortic valve in systole was 29 which previously had been 36. There was moderate left atrial enlargement. The patient had successful colonoscopy by Dr. Oletta Lamas on January 29, 2014. The patient has been having some symptoms of anxiety and Dr. Casimiro Needle is following him. He is also seeing a psychologist. He has been monitoring his blood pressure. His systolic blood pressures have been running high. He is watching his salt and is not having any peripheral edema.  Until recently he has remained in normal sinus rhythm since his cardioversion on 12/30/13. He has been on long-term anticoagulation with Xarelto He has not been experiencing any chest discomfort. He has had short of breath and fatigue which she attributes partially to an iron deficiency. He is now on iron supplementation. He has also been having a lot of problems with diarrhea and what appears to be  a hyperactive gastrocolic reflex. He takes 2 Lomotil each morning. He is diabetic. He is no longer taking metformin. His nephrologist stopped it because of evidence of some early acidosis. His lisinopril is also presently on hold because of his renal insufficiency. Last  weekend he awoke on Saturday morning and his heart rate was greater than 100.  He felt that he had probably gone back into atrial flutter.  His heart rate at rest is 53 and with minimal activity it jumps up to about 100 beats per minute  Past Medical History  Diagnosis Date  . Coronary heart disease     stent, CABG, RBBB  . Valvular heart disease     aortic stenosis/regurgitation  . Sarcoid 2011    pulmonary and bone marrow  . Hypercalcemia 2011    due to sarcoidiosis  . Bone marrow disease   . Diabetes mellitus   . Hemorrhagic cystitis 2012  . Gout   . HTN (hypertension)   . A-fib     controlled with medication  . Myocardial infarction     1995, 2002, 2006  . OSA (obstructive sleep apnea)     use bipap setting of 10 and 12  . Complication of anesthesia 11-16-13    required alot of versed per anesthesia with cataract surgery  . Chronic kidney disease (CKD), stage III (moderate)     lov note dr Koleen Nimrod nephrology 09-17-13 on chart  . Heart murmur   . Atrial flutter     hx of  . CHF (congestive heart failure)   . Pneumonia 1966, 2011    hx of  . Anxiety   . Osteoarthritis   . DDD (degenerative disc disease)   . Synovial cyst of lumbar spine     l3-l4, l4-l5, injections  . Anemia   . Hepatitis     mono  . Cancer     basal cell  . Right sciatic nerve pain     for block thursday 01-21-2014, injections  . Fungal toenail infection   . Biceps tendon tear     right  . Diabetes     Past Surgical History  Procedure Laterality Date  . Cataract surgery Right 2007  . Prostate surgery  oct. 2012    TURP  . Vasectomy  1977  . Redo median sternotomy, extracorporeal cirulation, avr, tricuspid valve repair  06/13/2011    AVR(23-mm Edwards pericardial Magna-Ease valve./ TVrepair (34-mm Edwards MC3 annuloplasty ring  . Coronary artery bypass graft  2006    x 5  . Cataract surgery Left 11-16-13  . Cardioversion N/A 12/30/2013    Procedure: CARDIOVERSION;  Surgeon: [name], MD;  Location: [facility] ENDOSCOPY;  Service: Cardiovascular;  Laterality: N/A;  10:49 cardioversion at 120 joules, then 150 joules, to SB  used  Lido 30mg ,  Propofol 160 mcg  . Colonoscopy N/A 01/29/2014    Procedure: COLONOSCOPY;  Surgeon: Winfield Cunas., MD;  Location: [facility] ENDOSCOPY;  Service: Endoscopy;  Laterality: N/A;  amanda//ja  . Cardioversion  2003, 2006, 2012, 2013  . Portacath placement    . Portacath removed    . Basal cell carcinoma excision Right 2015    ear  . Total knee arthroplasty Right 05/24/2014    Procedure: RIGHT TOTAL KNEE ARTHROPLASTY;  Surgeon: Gearlean Alf, MD;  Location: [facility] ORS;  Service: Orthopedics;  Laterality: Right;     Current Outpatient Prescriptions  Medication Sig Dispense Refill  . acetaminophen (TYLENOL) 500 MG tablet Take 1,000 mg by  mouth every 6 (six) hours as needed (pain).     [name] allopurinol (ZYLOPRIM) 100 MG tablet Take 200 mg by mouth daily with breakfast.     . amLODipine (NORVASC) 10 MG tablet Take 1 tablet (10 mg total) by mouth daily. 30 tablet 1  . CALCIUM-MAGNESIUM-VITAMIN D ER PO Take 1 tablet by mouth daily. Calcium citrate 200 mg, magnesium 100 mg, Vitamin D 200 units    . hydrALAZINE (APRESOLINE) 25 MG tablet Take 25 mg by mouth 2 (two) times daily.  6  . insulin glargine (LANTUS) 100 unit/mL SOPN Inject 2-3 Units into the skin at bedtime as needed (CBG >120). CBG 120-140 2 units, 140-160 3 units    . isosorbide dinitrate (ISOCHRON) 40 MG CR tablet Take 40 mg by mouth 2 (two) times daily.  3  . metoprolol tartrate (LOPRESSOR) 25 MG tablet Take 1 tablet (25 mg total) by mouth 2 (two) times daily. 60 tablet 1  . ONETOUCH VERIO test strip 2 (two) times daily. use for testing  6  . pantoprazole (PROTONIX) 40 MG tablet Take 1 tablet (40 mg total) by mouth 2 (two) times daily. 40 tablet 0  . Rivaroxaban (XARELTO) 15 MG TABS tablet Take 15 mg by mouth daily with supper.     . terazosin (HYTRIN) 10 MG capsule Take 10 mg by mouth daily  with breakfast.    . testosterone cypionate (DEPOTESTOTERONE CYPIONATE) 200 MG/ML injection Inject 28ml intramuscularly as directed twice a month (Patient taking differently: Inject 200 mg into the muscle every 14 (fourteen) days. Last injection March 16 or 17, 2016) 10 mL 0  . traMADol (ULTRAM) 50 MG tablet Take 50-100 mg by mouth every 4 (four) hours as needed (pain).   0  . zolpidem (AMBIEN) 10 MG tablet Take 10 mg by mouth at bedtime as needed for sleep.      No current facility-administered medications for this visit.    Allergies:   Actos; Penicillins; Amiodarone; Latex; Codeine; Depakote; Januvia; Loop diuretics; Losartan; Nsaids; Onglyza; Orudis; Pentazocine lactate; Rozerem; Sertraline hcl; Benazepril hcl; Indomethacin; Minocycline; Poison ivy extract; Potassium iodide; and Shellfish allergy    Social History:  The patient  reports that he quit smoking about 19 years ago. His smoking use included Pipe and Cigars. He has never used smokeless tobacco. He reports that he drinks alcohol. He reports that he does not use illicit drugs.   Family History:  The patient's family history includes Allergies in an other family member; Asthma in an other family member; Cancer in an other family member; Heart attack in his father; Heart disease in an other family member.    ROS:  Please see the history of present illness.   Otherwise, review of systems are positive for none.   All other systems are reviewed and negative.    PHYSICAL EXAM: VS:  BP 154/62 mmHg  Pulse 53  Ht 5\' 11"  (1.803 m)  Wt 252 lb (114.306 kg)  BMI 35.16 kg/m2 , BMI Body mass index is 35.16 kg/(m^2). GEN: Well nourished, well developed, in no acute distress HEENT: normal Neck: no JVD, carotid bruits, or masses Cardiac: RRR; there is a grade 2/6 systolic ejection murmur at the base across his prosthetic aortic valve. Respiratory:  clear to auscultation bilaterally, normal work of breathing GI: soft, nontender, nondistended, +  BS MS: no deformity or atrophy Skin: warm and dry, no rash Neuro:  Strength and sensation are intact Psych: euthymic mood, full affect   EKG:  EKG  is ordered today. The ekg ordered today demonstrates slow atrial flutter with 4-1 ventricular response.  Right bundle branch block   Recent Labs: 03/23/2015: ALT 11 03/26/2015: BUN 67*; Creatinine 3.57*; Hemoglobin 10.5*; Platelets 147*; Potassium 5.1; Sodium 136    Lipid Panel    Component Value Date/Time   CHOL 106 05/28/2014 0140   TRIG 169* 05/28/2014 0140   HDL 14* 05/28/2014 0140   CHOLHDL 7.6 05/28/2014 0140   VLDL 34 05/28/2014 0140   LDLCALC 58 05/28/2014 0140      Wt Readings from Last 3 Encounters:  04/26/15 252 lb (114.306 kg)  04/01/15 268 lb 6.4 oz (121.745 kg)  03/26/15 275 lb 3.2 oz (124.83 kg)        ASSESSMENT AND PLAN:  1. Ischemic heart disease status post CABG in 2006. Ejection fraction 55-60% by echocardiogram 12/13/14  2. History of severe aortic stenosis, with successful prosthetic aortic valve replacement in 2012. Most recent echocardiogram 12/13/14 shows mean gradient of 29 across the prosthetic aortic valve. 3. Past history of atrial fibrillation/flutter, holding normal sinus rhythm. Last cardioversion was in January 2015.  He now appears to be back in atrial flutter with slow ventricular response.  He is much more short of breath when he is not in normal sinus rhythm.  He has had multiple cardioversions in the past.  We will refer to EP.  He is intolerant of amiodarone.  He may benefit from a pacemaker.  Consideration of atrial flutter ablation could also be considered. 4. Status post tricuspid valve repair 2012 5. Diabetes mellitus,type 2 6. Chronic kidney disease followed by Dr.Coladanato, improving following recent admission for acute on chronic renal insufficiency 7. Essential hypertension   Current medicines are reviewed at length with the patient today.  The patient does not have  concerns regarding medicines.  The following changes have been made:  no change  Labs/ tests ordered today include:   Orders Placed This Encounter  Procedures  . Ambulatory referral to Cardiac Electrophysiology  . EKG 12-Lead    He will keep his June 5 appointment with me here.  Berna Spare MD 04/26/2015 5:46 PM    [facility] [address], [place], [place]  [address] Phone: [phone]; Fax: [phone]

## 2015-04-26 NOTE — Patient Instructions (Signed)
Medication Instructions:  Your physician recommends that you continue on your current medications as directed. Please refer to the Current Medication list given to you today.  Labwork: none  Testing/Procedures: none  Follow-Up: Keep your June 5 appointment   Any Other Special Instructions Will Be Listed Below  Will arrange EP consult

## 2015-05-04 ENCOUNTER — Encounter: Payer: Self-pay | Admitting: *Deleted

## 2015-05-04 ENCOUNTER — Ambulatory Visit (INDEPENDENT_AMBULATORY_CARE_PROVIDER_SITE_OTHER): Payer: Medicare Other | Admitting: Internal Medicine

## 2015-05-04 ENCOUNTER — Encounter: Payer: Self-pay | Admitting: Internal Medicine

## 2015-05-04 VITALS — BP 138/74 | HR 70 | Ht 71.0 in | Wt 245.8 lb

## 2015-05-04 DIAGNOSIS — I481 Persistent atrial fibrillation: Secondary | ICD-10-CM | POA: Diagnosis not present

## 2015-05-04 DIAGNOSIS — R001 Bradycardia, unspecified: Secondary | ICD-10-CM | POA: Diagnosis not present

## 2015-05-04 DIAGNOSIS — I484 Atypical atrial flutter: Secondary | ICD-10-CM

## 2015-05-04 DIAGNOSIS — I4819 Other persistent atrial fibrillation: Secondary | ICD-10-CM

## 2015-05-04 DIAGNOSIS — G4733 Obstructive sleep apnea (adult) (pediatric): Secondary | ICD-10-CM

## 2015-05-04 DIAGNOSIS — I480 Paroxysmal atrial fibrillation: Secondary | ICD-10-CM | POA: Diagnosis not present

## 2015-05-04 NOTE — Progress Notes (Signed)
Primary Care Physician: [name], MD Primary Cardiologist: Dr Mare Ferrari  Primary Electrophysiologist: Dr Rayann Heman Referring Physician: Dr Bethann Humble II, MD is a 74 y.o. male with a h/o atypical atrial flutter/atrial fibrillation who presents for EP consultation. The patient was initially diagnosed with atrial fibrillation/aflutter many years ago and he states that he has had recurrent electric cardioversion with the most recently being on 12/30/2013.  He is intolerant to amiodarone due to diarrhea. He was admitted in March 2016 for an episode of vomiting and hematemesis following a mechanical fall with a fractured rib. He suffered from acute renal on chronic insufficiencies and he has since been following up with nephrology. His creatinine was >8 and has gradually improved. He has underlying chronic renal disease stage III. He is currently on renal dosed Xarelto.  He has had longstanding difficulty with sinus bradycardia which has limited management. With his atrial arrhythmias,  he states that he does have episodes of tachycardia with HR >100 with minimal exertion and associated DOE.  Symptoms have progressively worsened over the past 2 weeks to the extent that his unable to do his regular activities. Also has episodes of bradycardia with HR as low as 30. His primary cardiologist is Dr. Mare Ferrari.  Patient has complex past medical history including CABG , aortic and triscupid valve replacement surgeries. Echocardiogram with LAE of 49 mm but with preserved EF.  Today, he denies symptoms of palpitations, chest pain, orthopnea, PND, dizziness, presyncope, syncope, snoring, daytime somnolence, bleeding, or neurologic sequela. The patient is tolerating medications without difficulties and is otherwise without complaint today.    Atrial Fibrillation Risk Factors:  he does have symptoms or diagnosis of sleep apnea. he is compliant with CPAP therapy.  he does not have a  history of rheumatic fever.  he does not have a history of alcohol use.  he has a BMI of Body mass index is 34.3 kg/(m^2).[name] Filed Weights   05/04/15 1455  Weight: 245 lb 12.8 oz (111.494 kg)    LA size: 95mm   Atrial Fibrillation Management history:  Previous antiarrhythmic drugs: Amiodarone >> unable to tolerate it due to diarrhea  Previous cardioversions: Multiple time with most recently being 12/2013   Previous ablations: none  CHADS2VASC score: 6  Anticoagulation history: Xarelto   Past Medical History  Diagnosis Date  . Coronary heart disease     stent, CABG, RBBB  . Valvular heart disease     aortic stenosis/regurgitation  . Sarcoid 2011    pulmonary and bone marrow  . Hypercalcemia 2011    due to sarcoidiosis  . Bone marrow disease   . Diabetes mellitus   . Hemorrhagic cystitis 2012  . Gout   . HTN (hypertension)   . A-fib     controlled with medication  . Myocardial infarction     1995, 2002, 2006  . OSA (obstructive sleep apnea)     use bipap setting of 10 and 12  . Complication of anesthesia 11-16-13    required alot of versed per anesthesia with cataract surgery  . Chronic kidney disease (CKD), stage III (moderate)     lov note dr Koleen Nimrod nephrology 09-17-13 on chart  . Heart murmur   . Atrial flutter     hx of  . CHF (congestive heart failure)   . Pneumonia 1966, 2011    hx of  . Anxiety   . Osteoarthritis   . DDD (degenerative disc disease)   . Synovial  cyst of lumbar spine     l3-l4, l4-l5, injections  . Anemia   . Hepatitis     mono  . Cancer     basal cell  . Right sciatic nerve pain     for block thursday 01-21-2014, injections  . Fungal toenail infection   . Biceps tendon tear     right  . Diabetes    Past Surgical History  Procedure Laterality Date  . Cataract surgery Right 2007  . Prostate surgery  oct. 2012    TURP  . Vasectomy  1977  . Redo median sternotomy, extracorporeal cirulation, avr, tricuspid valve repair   06/13/2011    AVR(23-mm Edwards pericardial Magna-Ease valve./ TVrepair (34-mm Edwards MC3 annuloplasty ring  . Coronary artery bypass graft  2006    x 5  . Cataract surgery Left 11-16-13  . Cardioversion N/A 12/30/2013    Procedure: CARDIOVERSION;  Surgeon: [name], MD;  Location: [facility] ENDOSCOPY;  Service: Cardiovascular;  Laterality: N/A;  10:49 cardioversion at 120 joules, then 150 joules, to SB  used  Lido 30mg ,  Propofol 160 mcg  . Colonoscopy N/A 01/29/2014    Procedure: COLONOSCOPY;  Surgeon: Winfield Cunas., MD;  Location: [facility] ENDOSCOPY;  Service: Endoscopy;  Laterality: N/A;  amanda//ja  . Cardioversion  2003, 2006, 2012, 2013  . Portacath placement    . Portacath removed    . Basal cell carcinoma excision Right 2015    ear  . Total knee arthroplasty Right 05/24/2014    Procedure: RIGHT TOTAL KNEE ARTHROPLASTY;  Surgeon: Gearlean Alf, MD;  Location: [facility] ORS;  Service: Orthopedics;  Laterality: Right;    Current Outpatient Prescriptions  Medication Sig Dispense Refill  . acetaminophen (TYLENOL) 500 MG tablet Take 1,000 mg by mouth every 6 (six) hours as needed (pain).     [name] allopurinol (ZYLOPRIM) 100 MG tablet Take 200 mg by mouth daily with breakfast.     . amLODipine (NORVASC) 10 MG tablet Take 1 tablet (10 mg total) by mouth daily. 30 tablet 1  . CALCIUM-MAGNESIUM-VITAMIN D ER PO Take 1 tablet by mouth daily. Calcium citrate 200 mg, magnesium 100 mg, Vitamin D 200 units    . diazepam (VALIUM) 10 MG tablet Take 1 tablet by mouth daily as needed. cramps  0  . insulin glargine (LANTUS) 100 unit/mL SOPN Inject 2-3 Units into the skin every morning. CBG 120-140 2 units, 140-160 3 units    . metoprolol tartrate (LOPRESSOR) 25 MG tablet Take 1 tablet by mouth 2-4 times daily    . ONETOUCH VERIO test strip 2 (two) times daily. use for testing  6  . Rivaroxaban (XARELTO) 15 MG TABS tablet Take 15 mg by mouth daily with supper.     . terazosin (HYTRIN) 10 MG capsule Take 10 mg by  mouth daily with breakfast.    . testosterone cypionate (DEPOTESTOTERONE CYPIONATE) 200 MG/ML injection Inject 38ml intramuscularly as directed twice a month (Patient taking differently: Inject 200 mg into the muscle every 14 (fourteen) days. Last injection March 16 or 17, 2016) 10 mL 0  . traMADol (ULTRAM) 50 MG tablet Take 50-100 mg by mouth every 4 (four) hours as needed (pain).   0  . zolpidem (AMBIEN) 10 MG tablet Take 10 mg by mouth at bedtime as needed for sleep.      No current facility-administered medications for this visit.    Allergies  Allergen Reactions  . Actos [Pioglitazone Hydrochloride] Swelling and Other (See Comments)  Severe peripheral edema  . Hydralazine     Severe chills and SOB   . Isosorbide Dinitrate     Severe chills and SOB   . Penicillins Swelling and Other (See Comments)    Serum sickness  . Amiodarone Other (See Comments)    diarrhea  . Latex Swelling  . Codeine Other (See Comments)    Makes Pt aggressive  . Depakote [Divalproex Sodium] Diarrhea    severe  . Januvia [Sitagliptin] Diarrhea and Other (See Comments)    bradycardia  . Loop Diuretics Other (See Comments)    Spike in BUN levels  . Losartan Other (See Comments)    aphysia  . Nsaids Other (See Comments)    Renal problems  . Onglyza [Saxagliptin] Diarrhea and Other (See Comments)    bradycardia  . Orudis [Ketoprofen] Other (See Comments)    Cannot take due to renal insufficiency  . Pentazocine Lactate Other (See Comments)    Unknown allergic reaction - pt and wife do not recall this  . Rozerem [Ramelteon] Diarrhea    Severe   . Sertraline Hcl Other (See Comments)    Pt and wife do not recall this  . Benazepril Hcl Other (See Comments)    ? Possible lowers plateletes  . Indomethacin Other (See Comments)    Renal problems   . Minocycline Other (See Comments)    Makes dizzy and feel just lousy.  . Poison Ivy Extract [Extract Of Poison Ivy] Rash and Other (See Comments)     blisters  . Potassium Iodide Itching and Rash  . Shellfish Allergy Rash    History   Social History  . Marital Status: Married    Spouse Name: N/A  . Number of Children: 4  . Years of Education: N/A   Occupational History  . retired Administrator, [employer]     due to coronary disease in 2000  . 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 Smoking status: Former Smoker -- 28 years    Types: Pipe, Cigars    Quit date: 12/25/1995  . Smokeless tobacco: Never Used  . Alcohol Use: Yes     Comment: 3-5 beer or wine per week  . Drug Use: No  . Sexual Activity: Not on file   Other Topics Concern  . Not on file   Social History Narrative    Family History  Problem Relation Age of Onset  . Heart attack Father   . Allergies    . Asthma    . Heart disease    . Cancer     The patient does not have a history of early familial atrial fibrillation or other arrhythmias.  ROS- All systems are reviewed and negative except as per the HPI above.  Physical Exam: Filed Vitals:   05/04/15 1455  BP: 138/74  Pulse: 70  Height: 5\' 11"  (1.803 m)  Weight: 245 lb 12.8 oz (111.494 kg)    GEN- The patient is well appearing, alert and oriented x 3 today.   Head- normocephalic, atraumatic Eyes-  Sclera clear, conjunctiva pink Ears- hearing intact Oropharynx- clear Neck- supple, no JVP Lymph- no cervical lymphadenopathy Lungs- Clear to ausculation bilaterally, normal work of breathing Heart- irregular rate and rhythm  GI- soft, NT, ND, + BS Extremities- no clubbing, cyanosis, or edema MS- no significant deformity or atrophy Skin- no rash or lesion Psych- euthymic mood, full affect Neuro- strength and sensation are intact  EKG today:  Atypical  atrial flutter variable block. VR 70bpm. Right bundle branch block. Inferior wall Q waves.  QTc 477.  Prior ekgs are reviewed during sinus rhythm which reveals significant sinus bradycardia, first degree AV block,  RBBB  Echo:12/13/2014 Study Conclusions  - Left ventricle: The cavity size was normal. Wall thickness was increased in a pattern of severe LVH. Systolic function was normal. The estimated ejection fraction was in the range of 55% to 60%. Doppler parameters are consistent with elevated ventricular end-diastolic filling pressure. - Aortic valve: Tissue AVR with no perivalvular leak. Leaflets appear calcified but not well seen Increased systolic gradients. Mean gradient 29 mmHg compared to 36 mmHg in June and peak gradients also similar 62mmHg compared to 53 mmHg in June Consider TEE if clinically indicated - Mitral valve: Calcified annulus. - Left atrium: The atrium was moderately dilated. - Atrial septum: No defect or patent foramen ovale was identified. Epic records are reviewed at length today  Assessment and Plan:  1.  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  The patients CHAD2VASC score is 6.  he is  appropriately anticoagulated at this time. Ventricular rate of 70 with aflutter and 4:1 block. He has been unable tolerate this of blockers due to bradycardia.  He now has significant tachycardia with associated SOB. Antiarrhythmic therapy previously has been on Amiodarone. The patients left atrial size is 49 mm.  Additional echo findings include:  LVH, ejection fraction 55-60%.   Aortic and tricuspid valves in place and normal. Atrial septum: No defect or patent foramen ovale was identified.   He has significant atriopathy/ structural abnormality and I would anticipate that our ability to maintain sinus rhythm long term is low.  I discussed with Dr. Mare Ferrari today phone while the patient was in office and our recommendations PPM placement with medical rate control vs ablation.  Risks, benefits, and  alternatives to both approaches were discussed.  The patient is clear that he would prefer PPM implantation.  After permanent pacemaker placement, may try beta blockers for controlling tachycardia and possibly mitigate his current tachycardia-related symptoms. If unsuccessful with rate control, we could consider AV node ablation in the future. Risks and benefits of PPM placement have been explained to the patient in detail. He verbalized understanding and would like to proceed. He is intolerant to  Amiodarone. He is not a candidate for any other anti-arrhythmia medications due to renal failure and CAD. Continue with oral anticoagulation. A long discussion with the patient was had today regarding therapeutic strategies.  Extensive discussion of lifestyle modification including begin progressive daily aerobic exercise program and improve dietary compliance was also discussed.  A very complex level of decision making was required for this visit today.  We will proceed with PPM at the next available time.  2. Morbid obesity As above, lifestyle modification was discussed at length including regular exercise and weight reduction.  3. Obstructive sleep apnea:   He is using the BiPAP machine at home.  4.  HTN: BP well controlled. Continue with amlodipine.  Thompson Grayer MD, [facility] 05/04/2015 10:45 PM

## 2015-05-04 NOTE — Patient Instructions (Signed)
Medication Instructions:  Your physician recommends that you continue on your current medications as directed. Please refer to the Current Medication list given to you today.   Labwork: Your physician recommends that you return for lab work today: BMP/CBC   Testing/Procedures: Your physician has recommended that you have a pacemaker inserted. A pacemaker is a small device that is placed under the skin of your chest or abdomen to help control abnormal heart rhythms. This device uses electrical pulses to prompt the heart to beat at a normal rate. Pacemakers are used to treat heart rhythms that are too slow. Wire (leads) are attached to the pacemaker that goes into the chambers of you heart. This is done in the hospital and usually requires and overnight stay. Please see the instruction sheet given to you today for more information.    Follow-Up: Your physician recommends that you schedule a follow-up appointment in: 7-10 days from 05/05/15 in device clinic for wound check   Any Other Special Instructions Will Be Listed Below (If Applicable).

## 2015-05-05 ENCOUNTER — Encounter (HOSPITAL_COMMUNITY)
Admission: RE | Disposition: A | Discharge status: Home / Self Care | Payer: Medicare Other | Source: Ambulatory Visit | Attending: Internal Medicine

## 2015-05-05 ENCOUNTER — Ambulatory Visit (HOSPITAL_COMMUNITY)
Admission: RE | Admit: 2015-05-05 | Discharge: 2015-05-06 | Disposition: A | Discharge status: Home / Self Care | Payer: Medicare Other | Source: Ambulatory Visit | Attending: Internal Medicine | Admitting: Internal Medicine

## 2015-05-05 DIAGNOSIS — Z6834 Body mass index (BMI) 34.0-34.9, adult: Secondary | ICD-10-CM | POA: Diagnosis not present

## 2015-05-05 DIAGNOSIS — I509 Heart failure, unspecified: Secondary | ICD-10-CM | POA: Diagnosis not present

## 2015-05-05 DIAGNOSIS — I495 Sick sinus syndrome: Secondary | ICD-10-CM | POA: Diagnosis present

## 2015-05-05 DIAGNOSIS — I480 Paroxysmal atrial fibrillation: Secondary | ICD-10-CM

## 2015-05-05 DIAGNOSIS — Z955 Presence of coronary angioplasty implant and graft: Secondary | ICD-10-CM | POA: Insufficient documentation

## 2015-05-05 DIAGNOSIS — I2510 Atherosclerotic heart disease of native coronary artery without angina pectoris: Secondary | ICD-10-CM | POA: Insufficient documentation

## 2015-05-05 DIAGNOSIS — Z951 Presence of aortocoronary bypass graft: Secondary | ICD-10-CM | POA: Insufficient documentation

## 2015-05-05 DIAGNOSIS — I481 Persistent atrial fibrillation: Secondary | ICD-10-CM | POA: Insufficient documentation

## 2015-05-05 DIAGNOSIS — I484 Atypical atrial flutter: Secondary | ICD-10-CM | POA: Diagnosis not present

## 2015-05-05 DIAGNOSIS — Z959 Presence of cardiac and vascular implant and graft, unspecified: Secondary | ICD-10-CM | POA: Insufficient documentation

## 2015-05-05 DIAGNOSIS — Z87891 Personal history of nicotine dependence: Secondary | ICD-10-CM | POA: Diagnosis not present

## 2015-05-05 DIAGNOSIS — G4733 Obstructive sleep apnea (adult) (pediatric): Secondary | ICD-10-CM | POA: Insufficient documentation

## 2015-05-05 DIAGNOSIS — I252 Old myocardial infarction: Secondary | ICD-10-CM | POA: Diagnosis not present

## 2015-05-05 DIAGNOSIS — I129 Hypertensive chronic kidney disease with stage 1 through stage 4 chronic kidney disease, or unspecified chronic kidney disease: Secondary | ICD-10-CM | POA: Insufficient documentation

## 2015-05-05 DIAGNOSIS — R001 Bradycardia, unspecified: Secondary | ICD-10-CM | POA: Diagnosis present

## 2015-05-05 DIAGNOSIS — E119 Type 2 diabetes mellitus without complications: Secondary | ICD-10-CM | POA: Diagnosis not present

## 2015-05-05 DIAGNOSIS — N183 Chronic kidney disease, stage 3 (moderate): Secondary | ICD-10-CM | POA: Diagnosis not present

## 2015-05-05 DIAGNOSIS — I4892 Unspecified atrial flutter: Secondary | ICD-10-CM | POA: Diagnosis present

## 2015-05-05 DIAGNOSIS — Z880 Allergy status to penicillin: Secondary | ICD-10-CM | POA: Insufficient documentation

## 2015-05-05 HISTORY — PX: INSERT / REPLACE / REMOVE PACEMAKER: SUR710

## 2015-05-05 HISTORY — PX: EP IMPLANTABLE DEVICE: SHX172B

## 2015-05-05 LAB — SURGICAL PCR SCREEN
MRSA, PCR: NEGATIVE
STAPHYLOCOCCUS AUREUS: NEGATIVE

## 2015-05-05 LAB — GLUCOSE, CAPILLARY
Glucose-Capillary: 122 mg/dL — ABNORMAL HIGH (ref 65–99)
Glucose-Capillary: 180 mg/dL — ABNORMAL HIGH (ref 65–99)

## 2015-05-05 LAB — BASIC METABOLIC PANEL
BUN: 36 mg/dL — AB (ref 6–23)
CO2: 21 mEq/L (ref 19–32)
CREATININE: 2.26 mg/dL — AB (ref 0.40–1.50)
Calcium: 9 mg/dL (ref 8.4–10.5)
Chloride: 110 mEq/L (ref 96–112)
GFR: 30.18 mL/min — AB (ref >60.00)
Glucose, Bld: 123 mg/dL — ABNORMAL HIGH (ref 70–99)
Potassium: 4.5 mEq/L (ref 3.5–5.1)
Sodium: 137 mEq/L (ref 135–145)

## 2015-05-05 LAB — CBC WITH DIFFERENTIAL/PLATELET
Basophils Absolute: 0.1 10*3/uL (ref 0.0–0.1)
Basophils Relative: 2 % (ref 0.0–3.0)
Eosinophils Absolute: 0.1 10*3/uL (ref 0.0–0.7)
Eosinophils Relative: 2.3 % (ref 0.0–5.0)
HEMATOCRIT: 37.6 % — AB (ref 39.0–52.0)
Hemoglobin: 12.3 g/dL — ABNORMAL LOW (ref 13.0–17.0)
Lymphocytes Relative: 10.7 % — ABNORMAL LOW (ref 12.0–46.0)
Lymphs Abs: 0.5 10*3/uL — ABNORMAL LOW (ref 0.7–4.0)
MCHC: 32.6 g/dL (ref 30.0–36.0)
MCV: 84.9 fl (ref 78.0–100.0)
Monocytes Absolute: 0.3 10*3/uL (ref 0.1–1.0)
Monocytes Relative: 6.5 % (ref 3.0–12.0)
NEUTROS PCT: 78.5 % — AB (ref 43.0–77.0)
Neutro Abs: 3.6 10*3/uL (ref 1.4–7.7)
PLATELETS: 175 10*3/uL (ref 150.0–400.0)
RBC: 4.43 Mil/uL (ref 4.22–5.81)
RDW: 17.5 % — AB (ref 11.5–15.5)
WBC: 4.6 10*3/uL (ref 4.0–10.5)

## 2015-05-05 SURGERY — PACEMAKER IMPLANT
Anesthesia: LOCAL

## 2015-05-05 MED ORDER — CEFAZOLIN SODIUM-DEXTROSE 2-3 GM-% IV SOLR
INTRAVENOUS | Status: AC
  Filled 2015-05-05: qty 50

## 2015-05-05 MED ORDER — VANCOMYCIN HCL 10 G IV SOLR
1500.0000 mg | INTRAVENOUS | Status: DC
  Filled 2015-05-05: qty 1500

## 2015-05-05 MED ORDER — MIDAZOLAM HCL 5 MG/5ML IJ SOLN
INTRAMUSCULAR | Status: DC | PRN
  Administered 2015-05-05 (×4): 1 mg via INTRAVENOUS

## 2015-05-05 MED ORDER — LIDOCAINE HCL (PF) 1 % IJ SOLN
INTRAMUSCULAR | Status: AC
  Filled 2015-05-05: qty 30

## 2015-05-05 MED ORDER — DILTIAZEM HCL ER COATED BEADS 240 MG PO CP24
240.0000 mg | ORAL_CAPSULE | Freq: Every day | ORAL | Status: DC
  Administered 2015-05-05 – 2015-05-06 (×2): 240 mg via ORAL
  Filled 2015-05-05 (×3): qty 1

## 2015-05-05 MED ORDER — MUPIROCIN 2 % EX OINT
1.0000 "application " | TOPICAL_OINTMENT | Freq: Once | CUTANEOUS | Status: AC
  Administered 2015-05-05: 1 via TOPICAL
  Filled 2015-05-05: qty 22

## 2015-05-05 MED ORDER — SODIUM CHLORIDE 0.9 % IV SOLN: 250.0000 mL | INTRAVENOUS | Status: DC | PRN

## 2015-05-05 MED ORDER — DIAZEPAM 5 MG PO TABS
ORAL_TABLET | ORAL | Status: AC
  Administered 2015-05-05: 20:00:00 10 mg
  Filled 2015-05-05: qty 2

## 2015-05-05 MED ORDER — LIDOCAINE HCL (PF) 1 % IJ SOLN
INTRAMUSCULAR | Status: AC
  Filled 2015-05-05: qty 60

## 2015-05-05 MED ORDER — MIDAZOLAM HCL 5 MG/5ML IJ SOLN
INTRAMUSCULAR | Status: AC
  Filled 2015-05-05: qty 5

## 2015-05-05 MED ORDER — CEFAZOLIN SODIUM 1-5 GM-% IV SOLN: 2.0000 g | Freq: Once | INTRAVENOUS | Status: DC

## 2015-05-05 MED ORDER — FENTANYL CITRATE (PF) 100 MCG/2ML IJ SOLN
INTRAMUSCULAR | Status: AC
  Filled 2015-05-05: qty 2

## 2015-05-05 MED ORDER — CEFAZOLIN SODIUM 1-5 GM-% IV SOLN
1.0000 g | Freq: Four times a day (QID) | INTRAVENOUS | Status: AC
  Administered 2015-05-05 – 2015-05-06 (×3): 1 g via INTRAVENOUS
  Filled 2015-05-05 (×3): qty 50

## 2015-05-05 MED ORDER — CHLORHEXIDINE GLUCONATE 4 % EX LIQD
60.0000 mL | Freq: Once | CUTANEOUS | Status: DC
  Filled 2015-05-05: qty 60

## 2015-05-05 MED ORDER — TRAMADOL HCL 50 MG PO TABS: 50.0000 mg | ORAL_TABLET | ORAL | Status: DC | PRN

## 2015-05-05 MED ORDER — SODIUM CHLORIDE 0.9 % IR SOLN
80.0000 mg | Status: DC
  Filled 2015-05-05 (×2): qty 2

## 2015-05-05 MED ORDER — METOPROLOL TARTRATE 25 MG PO TABS
25.0000 mg | ORAL_TABLET | Freq: Two times a day (BID) | ORAL | Status: DC
  Administered 2015-05-05 – 2015-05-06 (×2): 25 mg via ORAL
  Filled 2015-05-05 (×2): qty 1

## 2015-05-05 MED ORDER — AMLODIPINE BESYLATE 10 MG PO TABS: 10.0000 mg | ORAL_TABLET | Freq: Every day | ORAL | Status: DC

## 2015-05-05 MED ORDER — FENTANYL CITRATE (PF) 100 MCG/2ML IJ SOLN
INTRAMUSCULAR | Status: DC | PRN
  Administered 2015-05-05 (×2): 25 ug via INTRAVENOUS

## 2015-05-05 MED ORDER — CEFAZOLIN SODIUM-DEXTROSE 2-3 GM-% IV SOLR
2.0000 g | Freq: Once | INTRAVENOUS | Status: AC
  Administered 2015-05-05: 2 g via INTRAVENOUS

## 2015-05-05 MED ORDER — ACETAMINOPHEN 500 MG PO TABS
1000.0000 mg | ORAL_TABLET | Freq: Four times a day (QID) | ORAL | Status: DC | PRN
  Administered 2015-05-05 – 2015-05-06 (×2): 1000 mg via ORAL
  Filled 2015-05-05: qty 2

## 2015-05-05 MED ORDER — DIAZEPAM 5 MG PO TABS
10.0000 mg | ORAL_TABLET | Freq: Once | ORAL | Status: DC
  Administered 2015-05-05: 20:00:00 10 mg via ORAL
  Filled 2015-05-05: qty 2

## 2015-05-05 MED ORDER — SODIUM CHLORIDE 0.9 % IJ SOLN
3.0000 mL | Freq: Two times a day (BID) | INTRAMUSCULAR | Status: DC
  Administered 2015-05-05 – 2015-05-06 (×2): 3 mL via INTRAVENOUS

## 2015-05-05 MED ORDER — ZOLPIDEM TARTRATE 5 MG PO TABS
10.0000 mg | ORAL_TABLET | Freq: Every evening | ORAL | Status: DC | PRN
  Administered 2015-05-05: 10 mg via ORAL
  Filled 2015-05-05: qty 2

## 2015-05-05 MED ORDER — INSULIN ASPART 100 UNIT/ML ~~LOC~~ SOLN: 0.0000 [IU] | Freq: Every day | SUBCUTANEOUS | Status: DC

## 2015-05-05 MED ORDER — MUPIROCIN 2 % EX OINT
TOPICAL_OINTMENT | CUTANEOUS | Status: AC
  Administered 2015-05-05: 1 via TOPICAL
  Filled 2015-05-05: qty 22

## 2015-05-05 MED ORDER — DIAZEPAM 5 MG PO TABS: 10.0000 mg | ORAL_TABLET | Freq: Every day | ORAL | Status: DC | PRN

## 2015-05-05 MED ORDER — HEPARIN SODIUM (PORCINE) 1000 UNIT/ML IJ SOLN
INTRAMUSCULAR | Status: AC
  Filled 2015-05-05: qty 1

## 2015-05-05 MED ORDER — INSULIN ASPART 100 UNIT/ML ~~LOC~~ SOLN: 0.0000 [IU] | Freq: Three times a day (TID) | SUBCUTANEOUS | Status: DC

## 2015-05-05 MED ORDER — ONDANSETRON HCL 4 MG/2ML IJ SOLN: 4.0000 mg | Freq: Four times a day (QID) | INTRAMUSCULAR | Status: DC | PRN

## 2015-05-05 MED ORDER — SODIUM CHLORIDE 0.9 % IJ SOLN: 3.0000 mL | INTRAMUSCULAR | Status: DC | PRN

## 2015-05-05 MED ORDER — SODIUM CHLORIDE 0.9 % IV SOLN
INTRAVENOUS | Status: DC
  Administered 2015-05-05: 15:00:00 via INTRAVENOUS

## 2015-05-05 MED ORDER — TERAZOSIN HCL 5 MG PO CAPS
10.0000 mg | ORAL_CAPSULE | Freq: Every day | ORAL | Status: DC
  Administered 2015-05-06: 10 mg via ORAL
  Filled 2015-05-05 (×2): qty 2

## 2015-05-05 MED ORDER — LISINOPRIL 10 MG PO TABS
20.0000 mg | ORAL_TABLET | Freq: Every day | ORAL | Status: DC
  Administered 2015-05-06: 20 mg via ORAL
  Filled 2015-05-05: qty 2

## 2015-05-05 MED ORDER — ALLOPURINOL 100 MG PO TABS
200.0000 mg | ORAL_TABLET | Freq: Every day | ORAL | Status: DC
  Administered 2015-05-06: 09:00:00 200 mg via ORAL
  Filled 2015-05-05 (×2): qty 2

## 2015-05-05 SURGICAL SUPPLY — 7 items
CABLE SURGICAL S-101-97-12 (CABLE) ×2 IMPLANT
LEAD ISOFLEX OPT 1948-58CM (Lead) ×2 IMPLANT
LEAD TENDRIL SDX 2088TC-52CM (Lead) ×2 IMPLANT
PAD DEFIB LIFELINK (PAD) ×2 IMPLANT
PPM ASSURITY DR PM2240 (Pacemaker) ×2 IMPLANT
SHEATH CLASSIC 7F (SHEATH) ×4 IMPLANT
TRAY PACEMAKER INSERTION (CUSTOM PROCEDURE TRAY) ×2 IMPLANT

## 2015-05-05 NOTE — Progress Notes (Signed)
Pt is allergic to penicillin but states that he can take cephalasporins.  D/w Dr Rayann Heman, will give Ancef pre-op for PPM implant.  Chanetta Marshall, NP 05/05/2015 3:55 PM

## 2015-05-05 NOTE — Progress Notes (Signed)
Spoke with pharmacy re: current rx of both diltiazem and norvasc. Dilt was started today after PPM implant. Will plan to hold norvasc per pharmacy recs and defer long term antihypertensive regimen to day team.

## 2015-05-05 NOTE — Progress Notes (Signed)
Duplicate CCB ordered on patient: Diltiazem (new start) and Amlodpine (home med). Discussed with fellow Tommi Rumps) who gave telephone order to hold Amlodipine for now.   Sloan Leiter, PharmD, BCPS Clinical Pharmacist [phone] 05/05/2015, 8:23 PM

## 2015-05-05 NOTE — H&P (View-Only) (Signed)
Primary Care Physician: [name], MD Primary Cardiologist: Dr Mare Ferrari  Primary Electrophysiologist: Dr Rayann Heman Referring Physician: Dr Bethann Humble II, MD is a 74 y.o. male with a h/o atypical atrial flutter/atrial fibrillation who presents for EP consultation. The patient was initially diagnosed with atrial fibrillation/aflutter many years ago and he states that he has had recurrent electric cardioversion with the most recently being on 12/30/2013.  He is intolerant to amiodarone due to diarrhea. He was admitted in March 2016 for an episode of vomiting and hematemesis following a mechanical fall with a fractured rib. He suffered from acute renal on chronic insufficiencies and he has since been following up with nephrology. His creatinine was >8 and has gradually improved. He has underlying chronic renal disease stage III. He is currently on renal dosed Xarelto.  He has had longstanding difficulty with sinus bradycardia which has limited management. With his atrial arrhythmias,  he states that he does have episodes of tachycardia with HR >100 with minimal exertion and associated DOE.  Symptoms have progressively worsened over the past 2 weeks to the extent that his unable to do his regular activities. Also has episodes of bradycardia with HR as low as 30. His primary cardiologist is Dr. Mare Ferrari.  Patient has complex past medical history including CABG , aortic and triscupid valve replacement surgeries. Echocardiogram with LAE of 49 mm but with preserved EF.  Today, he denies symptoms of palpitations, chest pain, orthopnea, PND, dizziness, presyncope, syncope, snoring, daytime somnolence, bleeding, or neurologic sequela. The patient is tolerating medications without difficulties and is otherwise without complaint today.    Atrial Fibrillation Risk Factors:  he does have symptoms or diagnosis of sleep apnea. he is compliant with CPAP therapy.  he does not have a  history of rheumatic fever.  he does not have a history of alcohol use.  he has a BMI of Body mass index is 34.3 kg/(m^2).[name] Filed Weights   05/04/15 1455  Weight: 245 lb 12.8 oz (111.494 kg)    LA size: 71mm   Atrial Fibrillation Management history:  Previous antiarrhythmic drugs: Amiodarone >> unable to tolerate it due to diarrhea  Previous cardioversions: Multiple time with most recently being 12/2013   Previous ablations: none  CHADS2VASC score: 6  Anticoagulation history: Xarelto   Past Medical History  Diagnosis Date  . Coronary heart disease     stent, CABG, RBBB  . Valvular heart disease     aortic stenosis/regurgitation  . Sarcoid 2011    pulmonary and bone marrow  . Hypercalcemia 2011    due to sarcoidiosis  . Bone marrow disease   . Diabetes mellitus   . Hemorrhagic cystitis 2012  . Gout   . HTN (hypertension)   . A-fib     controlled with medication  . Myocardial infarction     1995, 2002, 2006  . OSA (obstructive sleep apnea)     use bipap setting of 10 and 12  . Complication of anesthesia 11-16-13    required alot of versed per anesthesia with cataract surgery  . Chronic kidney disease (CKD), stage III (moderate)     lov note dr Koleen Nimrod nephrology 09-17-13 on chart  . Heart murmur   . Atrial flutter     hx of  . CHF (congestive heart failure)   . Pneumonia 1966, 2011    hx of  . Anxiety   . Osteoarthritis   . DDD (degenerative disc disease)   . Synovial  cyst of lumbar spine     l3-l4, l4-l5, injections  . Anemia   . Hepatitis     mono  . Cancer     basal cell  . Right sciatic nerve pain     for block thursday 01-21-2014, injections  . Fungal toenail infection   . Biceps tendon tear     right  . Diabetes    Past Surgical History  Procedure Laterality Date  . Cataract surgery Right 2007  . Prostate surgery  oct. 2012    TURP  . Vasectomy  1977  . Redo median sternotomy, extracorporeal cirulation, avr, tricuspid valve repair   06/13/2011    AVR(23-mm Edwards pericardial Magna-Ease valve./ TVrepair (34-mm Edwards MC3 annuloplasty ring  . Coronary artery bypass graft  2006    x 5  . Cataract surgery Left 11-16-13  . Cardioversion N/A 12/30/2013    Procedure: CARDIOVERSION;  Surgeon: [name], MD;  Location: [facility] ENDOSCOPY;  Service: Cardiovascular;  Laterality: N/A;  10:49 cardioversion at 120 joules, then 150 joules, to SB  used  Lido 30mg ,  Propofol 160 mcg  . Colonoscopy N/A 01/29/2014    Procedure: COLONOSCOPY;  Surgeon: Winfield Cunas., MD;  Location: [facility] END[facility]COPY;  Service: Endoscopy;  Laterality: N/A;  amanda//ja  . Cardioversion  2003, 2006, 2012, 2013  . Portacath placement    . Portacath removed    . Basal cell carcinoma excision Right 2015    ear  . Total knee arthroplasty Right 05/24/2014    Procedure: RIGHT TOTAL KNEE ARTHROPLASTY;  Surgeon: Gearlean Alf, MD;  Location: [facility] ORS;  Service: Orthopedics;  Laterality: Right;    Current Outpatient Prescriptions  Medication Sig Dispense Refill  . acetaminophen (TYLENOL) 500 MG tablet Take 1,000 mg by mouth every 6 (six) hours as needed (pain).     [name] allopurinol (ZYLOPRIM) 100 MG tablet Take 200 mg by mouth daily with breakfast.     . amLODipine (NORVASC) 10 MG tablet Take 1 tablet (10 mg total) by mouth daily. 30 tablet 1  . CALCIUM-MAGNESIUM-VITAMIN D ER PO Take 1 tablet by mouth daily. Calcium citrate 200 mg, magnesium 100 mg, Vitamin D 200 units    . diazepam (VALIUM) 10 MG tablet Take 1 tablet by mouth daily as needed. cramps  0  . insulin glargine (LANTUS) 100 unit/mL SOPN Inject 2-3 Units into the skin every morning. CBG 120-140 2 units, 140-160 3 units    . metoprolol tartrate (LOPRESSOR) 25 MG tablet Take 1 tablet by mouth 2-4 times daily    . ONETOUCH VERIO test strip 2 (two) times daily. use for testing  6  . Rivaroxaban (XARELTO) 15 MG TABS tablet Take 15 mg by mouth daily with supper.     . terazosin (HYTRIN) 10 MG capsule Take 10 mg by  mouth daily with breakfast.    . testosterone cypionate (DEPOTESTOTERONE CYPIONATE) 200 MG/ML injection Inject 78ml intramuscularly as directed twice a month (Patient taking differently: Inject 200 mg into the muscle every 14 (fourteen) days. Last injection March 16 or 17, 2016) 10 mL 0  . traMADol (ULTRAM) 50 MG tablet Take 50-100 mg by mouth every 4 (four) hours as needed (pain).   0  . zolpidem (AMBIEN) 10 MG tablet Take 10 mg by mouth at bedtime as needed for sleep.      No current facility-administered medications for this visit.    Allergies  Allergen Reactions  . Actos [Pioglitazone Hydrochloride] Swelling and Other (See Comments)  Severe peripheral edema  . Hydralazine     Severe chills and SOB   . Isosorbide Dinitrate     Severe chills and SOB   . Penicillins Swelling and Other (See Comments)    Serum sickness  . Amiodarone Other (See Comments)    diarrhea  . Latex Swelling  . Codeine Other (See Comments)    Makes Pt aggressive  . Depakote [Divalproex Sodium] Diarrhea    severe  . Januvia [Sitagliptin] Diarrhea and Other (See Comments)    bradycardia  . Loop Diuretics Other (See Comments)    Spike in BUN levels  . Losartan Other (See Comments)    aphysia  . Nsaids Other (See Comments)    Renal problems  . Onglyza [Saxagliptin] Diarrhea and Other (See Comments)    bradycardia  . Orudis [Ketoprofen] Other (See Comments)    Cannot take due to renal insufficiency  . Pentazocine Lactate Other (See Comments)    Unknown allergic reaction - pt and wife do not recall this  . Rozerem [Ramelteon] Diarrhea    Severe   . Sertraline Hcl Other (See Comments)    Pt and wife do not recall this  . Benazepril Hcl Other (See Comments)    ? Possible lowers plateletes  . Indomethacin Other (See Comments)    Renal problems   . Minocycline Other (See Comments)    Makes dizzy and feel just lousy.  . Poison Ivy Extract [Extract Of Poison Ivy] Rash and Other (See Comments)     blisters  . Potassium Iodide Itching and Rash  . Shellfish Allergy Rash    History   Social History  . Marital Status: Married    Spouse Name: N/A  . Number of Children: 4  . Years of Education: N/A   Occupational History  . retired Administrator, [employer]     due to coronary disease in 2000  . 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 Smoking status: Former Smoker -- 28 years    Types: Pipe, Cigars    Quit date: 12/25/1995  . Smokeless tobacco: Never Used  . Alcohol Use: Yes     Comment: 3-5 beer or wine per week  . Drug Use: No  . Sexual Activity: Not on file   Other Topics Concern  . Not on file   Social History Narrative    Family History  Problem Relation Age of Onset  . Heart attack Father   . Allergies    . Asthma    . Heart disease    . Cancer     The patient does not have a history of early familial atrial fibrillation or other arrhythmias.  ROS- All systems are reviewed and negative except as per the HPI above.  Physical Exam: Filed Vitals:   05/04/15 1455  BP: 138/74  Pulse: 70  Height: 5\' 11"  (1.803 m)  Weight: 245 lb 12.8 oz (111.494 kg)    GEN- The patient is well appearing, alert and oriented x 3 today.   Head- normocephalic, atraumatic Eyes-  Sclera clear, conjunctiva pink Ears- hearing intact Oropharynx- clear Neck- supple, no JVP Lymph- no cervical lymphadenopathy Lungs- Clear to ausculation bilaterally, normal work of breathing Heart- irregular rate and rhythm  GI- soft, NT, ND, + BS Extremities- no clubbing, cyanosis, or edema MS- no significant deformity or atrophy Skin- no rash or lesion Psych- euthymic mood, full affect Neuro- strength and sensation are intact  EKG today:  Atypical  atrial flutter variable block. VR 70bpm. Right bundle branch block. Inferior wall Q waves.  QTc 477.  Prior ekgs are reviewed during sinus rhythm which reveals significant sinus bradycardia, first degree AV block,  RBBB  Echo:12/13/2014 Study Conclusions  - Left ventricle: The cavity size was normal. Wall thickness was increased in a pattern of severe LVH. Systolic function was normal. The estimated ejection fraction was in the range of 55% to 60%. Doppler parameters are consistent with elevated ventricular end-diastolic filling pressure. - Aortic valve: Tissue AVR with no perivalvular leak. Leaflets appear calcified but not well seen Increased systolic gradients. Mean gradient 29 mmHg compared to 36 mmHg in June and peak gradients also similar 41mmHg compared to 53 mmHg in June Consider TEE if clinically indicated - Mitral valve: Calcified annulus. - Left atrium: The atrium was moderately dilated. - Atrial septum: No defect or patent foramen ovale was identified. Epic records are reviewed at length today  Assessment and Plan:  1.  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  The patients CHAD2VASC score is 6.  he is  appropriately anticoagulated at this time. Ventricular rate of 70 with aflutter and 4:1 block. He has been unable tolerate this of blockers due to bradycardia.  He now has significant tachycardia with associated SOB. Antiarrhythmic therapy previously has been on Amiodarone. The patients left atrial size is 49 mm.  Additional echo findings include:  LVH, ejection fraction 55-60%.   Aortic and tricuspid valves in place and normal. Atrial septum: No defect or patent foramen ovale was identified.   He has significant atriopathy/ structural abnormality and I would anticipate that our ability to maintain sinus rhythm long term is low.  I discussed with Dr. Mare Ferrari today phone while the patient was in office and our recommendations PPM placement with medical rate control vs ablation.  Risks, benefits, and  alternatives to both approaches were discussed.  The patient is clear that he would prefer PPM implantation.  After permanent pacemaker placement, may try beta blockers for controlling tachycardia and possibly mitigate his current tachycardia-related symptoms. If unsuccessful with rate control, we could consider AV node ablation in the future. Risks and benefits of PPM placement have been explained to the patient in detail. He verbalized understanding and would like to proceed. He is intolerant to  Amiodarone. He is not a candidate for any other anti-arrhythmia medications due to renal failure and CAD. Continue with oral anticoagulation. A long discussion with the patient was had today regarding therapeutic strategies.  Extensive discussion of lifestyle modification including begin progressive daily aerobic exercise program and improve dietary compliance was also discussed.  A very complex level of decision making was required for this visit today.  We will proceed with PPM at the next available time.  2. Morbid obesity As above, lifestyle modification was discussed at length including regular exercise and weight reduction.  3. Obstructive sleep apnea:   He is using the BiPAP machine at home.  4.  HTN: BP well controlled. Continue with amlodipine.  Thompson Grayer MD, [facility] 05/04/2015 10:45 PM

## 2015-05-05 NOTE — Progress Notes (Signed)
Patient placed himself on home CPAP for the night.  

## 2015-05-05 NOTE — Interval H&P Note (Signed)
History and Physical Interval Note:  05/05/2015 3:40 PM  Marylynn Pearson II, MD  has presented today for surgery, with the diagnosis of sick sinus syndrome  The various methods of treatment have been discussed with the patient and family. After consideration of risks, benefits and other options for treatment, the patient has consented to  Procedure(s): Pacemaker Implant (N/A) as a surgical intervention .  The patient's history has been reviewed, patient examined, no change in status, stable for surgery.  I have reviewed the patient's chart and labs.  Questions were answered to the patient's satisfaction.     Thompson Grayer

## 2015-05-06 ENCOUNTER — Encounter (HOSPITAL_COMMUNITY): Payer: Self-pay | Admitting: *Deleted

## 2015-05-06 ENCOUNTER — Other Ambulatory Visit: Payer: Self-pay | Admitting: Cardiology

## 2015-05-06 ENCOUNTER — Ambulatory Visit (HOSPITAL_COMMUNITY): Payer: Medicare Other

## 2015-05-06 DIAGNOSIS — Z959 Presence of cardiac and vascular implant and graft, unspecified: Secondary | ICD-10-CM | POA: Diagnosis not present

## 2015-05-06 DIAGNOSIS — I495 Sick sinus syndrome: Secondary | ICD-10-CM | POA: Diagnosis not present

## 2015-05-06 DIAGNOSIS — I484 Atypical atrial flutter: Secondary | ICD-10-CM | POA: Diagnosis not present

## 2015-05-06 DIAGNOSIS — N183 Chronic kidney disease, stage 3 (moderate): Secondary | ICD-10-CM | POA: Diagnosis not present

## 2015-05-06 DIAGNOSIS — I2510 Atherosclerotic heart disease of native coronary artery without angina pectoris: Secondary | ICD-10-CM | POA: Diagnosis not present

## 2015-05-06 DIAGNOSIS — R001 Bradycardia, unspecified: Secondary | ICD-10-CM | POA: Diagnosis not present

## 2015-05-06 LAB — BASIC METABOLIC PANEL
Anion gap: 9 (ref 5–15)
BUN: 42 mg/dL — ABNORMAL HIGH (ref 6–20)
CO2: 18 mmol/L — ABNORMAL LOW (ref 22–32)
CREATININE: 2.43 mg/dL — AB (ref 0.61–1.24)
Calcium: 8.7 mg/dL — ABNORMAL LOW (ref 8.9–10.3)
Chloride: 111 mmol/L (ref 101–111)
GFR calc non Af Amer: 24 mL/min — ABNORMAL LOW (ref >60)
GFR, EST AFRICAN AMERICAN: 28 mL/min — AB (ref >60)
GLUCOSE: 152 mg/dL — AB (ref 65–99)
Potassium: 4.5 mmol/L (ref 3.5–5.1)
Sodium: 138 mmol/L (ref 135–145)

## 2015-05-06 LAB — GLUCOSE, CAPILLARY: Glucose-Capillary: 134 mg/dL — ABNORMAL HIGH (ref 65–99)

## 2015-05-06 MED ORDER — TRAMADOL HCL 50 MG PO TABS: 50.0000 mg | ORAL_TABLET | Freq: Four times a day (QID) | ORAL | Status: DC | PRN

## 2015-05-06 MED ORDER — DILTIAZEM HCL ER COATED BEADS 240 MG PO CP24: 240.0000 mg | ORAL_CAPSULE | Freq: Every day | ORAL | Status: DC

## 2015-05-06 MED ORDER — YOU HAVE A PACEMAKER BOOK
Freq: Once | Status: AC
  Administered 2015-05-06: 04:00:00
  Filled 2015-05-06: qty 1

## 2015-05-06 MED FILL — Lidocaine HCl Local Preservative Free (PF) Inj 1%: INTRAMUSCULAR | Qty: 30 | Status: AC

## 2015-05-06 NOTE — Discharge Instructions (Signed)
° ° °  Supplemental Discharge Instructions for  Pacemaker/Defibrillator Patients  Activity No heavy lifting or vigorous activity with your left/right arm for 6 to 8 weeks.  Do not raise your left/right arm above your head for one week.  Gradually raise your affected arm as drawn below.           __      05/10/15                       05/11/15                 05/12/15                  05/13/15  NO DRIVING for  1 week   ; you may begin driving on 06/18/93    .  WOUND CARE - Keep the wound area clean and dry.  Do not get this area wet for one week. No showers for one week; you may shower on  05/13/15   . - The tape/steri-strips on your wound will fall off; do not pull them off.  No bandage is needed on the site.  DO  NOT apply any creams, oils, or ointments to the wound area. - If you notice any drainage or discharge from the wound, any swelling or bruising at the site, or you develop a fever > 101? F after you are discharged home, call the office at once.  Special Instructions - You are still able to use cellular telephones; use the ear opposite the side where you have your pacemaker/defibrillator.  Avoid carrying your cellular phone near your device. - When traveling through airports, show security personnel your identification card to avoid being screened in the metal detectors.  Ask the security personnel to use the hand wand. - Avoid arc welding equipment, MRI testing (magnetic resonance imaging), TENS units (transcutaneous nerve stimulators).  Call the office for questions about other devices. - Avoid electrical appliances that are in poor condition or are not properly grounded. - Microwave ovens are safe to be near or to operate.

## 2015-05-06 NOTE — Telephone Encounter (Signed)
Please advise on refill. Patients last ov with Dr Rayann Heman has that the patient takes 2-4 times qd, but I do not see where it was changed. Thanks, MI

## 2015-05-06 NOTE — Discharge Summary (Signed)
ELECTROPHYSIOLOGY PROCEDURE DISCHARGE SUMMARY    Patient ID: [name] Fiddler, MD,  MRN: [id number], DOB/AGE: [id number] y.o.  Admit date: 05/05/2015 Discharge date: 05/06/2015  Primary Care Physician: [name], MD Primary Cardiologist: Mare Ferrari Electrophysiologist: Daeshawn Redmann  Primary Discharge Diagnosis:  Symptomatic sick sinus syndrome/tachy-brady syndrome status post pacemaker implantation this admission  Secondary Discharge Diagnosis:  1.  Permanent atrial fibrillation/atypical atrial flutter 2.  Morbid obesity 3.  Sleep apnea - on bipap 4.  HTN 5.  CAD s/p CABG 6.  Diabetes 7.  Valvular heart disease   Allergies  Allergen Reactions  . Actos [Pioglitazone Hydrochloride] Swelling and Other (See Comments)    Severe peripheral edema  . Hydralazine     Severe chills and SOB   . Isosorbide Dinitrate     Severe chills and SOB   . Penicillins Swelling and Other (See Comments)    Serum sickness  . Amiodarone Other (See Comments)    diarrhea  . Latex Swelling  . Codeine Other (See Comments)    Makes Pt aggressive  . Depakote [Divalproex Sodium] Diarrhea    severe  . Januvia [Sitagliptin] Diarrhea and Other (See Comments)    bradycardia  . Loop Diuretics Other (See Comments)    Spike in BUN and creatine levels  . Losartan Other (See Comments)    aphysia  . Nsaids Other (See Comments)    Renal problems  . Onglyza [Saxagliptin] Diarrhea and Other (See Comments)    bradycardia  . Orudis [Ketoprofen] Other (See Comments)    Cannot take due to renal insufficiency  . Pentazocine Lactate Other (See Comments)    Unknown allergic reaction - pt and wife do not recall this  . Rozerem [Ramelteon] Diarrhea    Severe   . Sertraline Hcl Other (See Comments)    Pt and wife do not recall this  . Benazepril Hcl Other (See Comments)    ? Possible lowers plateletes  . Indomethacin Other (See Comments)    Renal problems   . Minocycline Other (See Comments)   Makes dizzy and feel just lousy.  . Poison Ivy Extract [Extract Of Poison Ivy] Rash and Other (See Comments)    blisters  . Potassium Iodide Itching and Rash  . Shellfish Allergy Rash     Procedures This Admission:  1.  Implantation of a STJ dual chamber PPM on 05/05/15 by Dr Rayann Heman.  The patient received a STJ model number Assurity PPM with model number 2088 right atrial lead and 1948 right ventricular lead. There were no immediate post procedure complications. 2.  CXR on 05-06-15 demonstrated no pneumothorax status post device implantation.   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  With significant co-morbidities and low likelihood of maintaining SR, pacemaker implantation was recommended with aggressive rate control.  Risks, benefits, and alternatives to PPM implantation were reviewed with the patient who wished to proceed.   Hospital Course:  The patient was admitted and underwent implantation of a STJ dual chamber pacemaker with details as outlined above.  He  was monitored on telemetry overnight which demonstrated atrial fibrillation with intermittent ventricular pacing.  Left chest was without hematoma or ecchymosis.  The device was interrogated and found to be functioning normally.  CXR was obtained and demonstrated no pneumothorax status post device implantation.  Wound care, arm mobility, and restrictions were reviewed with the patient.  The patient was examined and considered stable for discharge to home.    Physical Exam: Filed Vitals:   05/05/15 2257 05/05/15 2300 05/06/15 0305 05/06/15 0755  BP: 178/52 178/52 141/55 175/88  Pulse: 88 52 60 58  Temp: 98.7 F (37.1 C)  98 F (36.7 C) 98.3 F (36.8 C)  TempSrc: Oral  Oral Oral  Resp: 13 16 16 15   Height:      Weight:   240 lb 8.4 oz (109.1 kg)   SpO2: 97% 97% 96% 93%    GEN- The patient is elderly appearing,  alert and oriented x 3 today.   HEENT: normocephalic, atraumatic; sclera clear, conjunctiva pink; hearing intact; oropharynx clear; neck supple, no JVP Lymph- no cervical lymphadenopathy Lungs- Clear to ausculation bilaterally, normal work of breathing.  No wheezes, rales, rhonchi Heart- Irregular rate and rhythm  GI- soft, non-tender, non-distended, bowel sounds present  Extremities- no clubbing, cyanosis, or edema  MS- no significant deformity or atrophy Skin- warm and dry, no rash or lesion, left chest without hematoma/ecchymosis Psych- euthymic mood, full affect Neuro- strength and sensation are intact   Labs:   Lab Results  Component Value Date   WBC 4.6 05/04/2015   HGB 12.3* 05/04/2015   HCT 37.6* 05/04/2015   MCV 84.9 05/04/2015   PLT 175.0 05/04/2015     Recent Labs Lab 05/06/15 0240  NA 138  K 4.5  CL 111  CO2 18*  BUN 42*  CREATININE 2.43*  CALCIUM 8.7*  GLUCOSE 152*    Discharge Medications:    Medication List    TAKE these medications        acetaminophen 500 MG tablet  Commonly known as:  TYLENOL  Take 1,000 mg by mouth 2 (two) times daily as needed (pain).     allopurinol 100 MG tablet  Commonly known as:  ZYLOPRIM  Take 200 mg by mouth daily with breakfast.     amLODipine 10 MG tablet  Commonly known as:  NORVASC  Take 1 tablet (10 mg total) by mouth daily.     COCONUT OIL PO  Apply 1 application topically daily as needed (dry skin).     diazepam 10 MG tablet  Commonly known as:  VALIUM  Take 1 tablet by mouth daily as needed for anxiety or sleep. cramps     diltiazem 240 MG 24 hr capsule  Commonly known as:  CARDIZEM CD  Take 1 capsule (240 mg total) by mouth daily.     diphenoxylate-atropine 2.5-0.025 MG per tablet  Commonly known as:  LOMOTIL  Take 1-2 tablets by mouth daily as needed. stomach     insulin glargine 100 unit/mL Sopn  Commonly known as:  LANTUS  Inject 2-3 Units into the skin daily after breakfast. CBG 120-140 2  units, 140-160 3 units     lisinopril 20 MG tablet  Commonly known as:  PRINIVIL,ZESTRIL  Take 20 mg by mouth daily.     Magnesium 250 MG Tabs  Take 125 mg by mouth at bedtime.     metoprolol tartrate 25 MG tablet  Commonly known as:  LOPRESSOR  Take 25 mg by mouth 2 (two) times daily.     ONETOUCH VERIO test strip  Generic drug:  glucose blood  1 each by Other route 2 (two) times daily. use for testing     Rivaroxaban 15 MG Tabs tablet  Commonly known as:  XARELTO  Take 15 mg by mouth daily with supper.  terazosin 10 MG capsule  Commonly known as:  HYTRIN  Take 10 mg by mouth daily with breakfast.     TERBINAFINE EX  Apply 1 drop topically at bedtime. Place 1 drop on each toenail.     testosterone cypionate 200 MG/ML injection  Commonly known as:  DEPOTESTOTERONE CYPIONATE  Inject 75ml intramuscularly as directed twice a month     traMADol 50 MG tablet  Commonly known as:  ULTRAM  Take 50-100 mg by mouth every 4 (four) hours as needed (pain).     traMADol 50 MG tablet  Commonly known as:  ULTRAM  Take 1 tablet (50 mg total) by mouth every 6 (six) hours as needed for severe pain.     zolpidem 10 MG tablet  Commonly known as:  AMBIEN  Take 10 mg by mouth at bedtime as needed for sleep.        Disposition:  Discharge Instructions    Diet - low sodium heart healthy    Complete by:  As directed      Increase activity slowly    Complete by:  As directed           Follow-up Information    Follow up with [facility] On 05/12/2015.   Why:  at [facility] for wound check   Contact information:   [address] [place] [address]       Follow up with Warren Danes, MD On 06/01/2015.   Specialty:  Cardiology   Why:  at [facility] information:   [address] [place] [address] [phone]       Duration of Discharge Encounter: Greater than 30 minutes including physician time.  Signed, Chanetta Marshall,  NP 05/06/2015 8:21 AM   I have seen, examined the patient, and reviewed the above assessment and plan.  Changes to above are made where necessary.  CXR reveals stable lead position/ no ptx.  V rates are improved with diltiazem.  Device interrogation is reviewed.  DC to home.  Routine wound care and follow-up.  Co Sign: Thompson Grayer, MD 05/06/2015 10:42 PM

## 2015-05-09 ENCOUNTER — Telehealth: Payer: Self-pay | Admitting: Internal Medicine

## 2015-05-09 ENCOUNTER — Other Ambulatory Visit: Payer: Self-pay | Admitting: Cardiology

## 2015-05-09 MED ORDER — AMLODIPINE BESYLATE 10 MG PO TABS: 5.0000 mg | ORAL_TABLET | Freq: Every day | ORAL | Status: DC

## 2015-05-09 NOTE — Telephone Encounter (Signed)
Spoke with Dr [name] and discussed with Dr Rayann Heman as the patient is still having SOB.  We will decrease his Amlodipine to 5 mg daily and he will  Keep his follow up Thurs

## 2015-05-09 NOTE — Telephone Encounter (Signed)
New message ° ° ° ° ° °Talk to the nurse----pt would not tell me what he wanted °

## 2015-05-10 ENCOUNTER — Inpatient Hospital Stay (HOSPITAL_COMMUNITY): Payer: Medicare Other

## 2015-05-10 ENCOUNTER — Ambulatory Visit (INDEPENDENT_AMBULATORY_CARE_PROVIDER_SITE_OTHER): Payer: Medicare Other | Admitting: Physician Assistant

## 2015-05-10 ENCOUNTER — Inpatient Hospital Stay (HOSPITAL_COMMUNITY)
Admission: AD | Admit: 2015-05-10 | Discharge: 2015-05-12 | DRG: 292 | Disposition: A | Discharge status: Home / Self Care | Payer: Medicare Other | Source: Ambulatory Visit | Attending: Cardiology | Admitting: Cardiology

## 2015-05-10 ENCOUNTER — Encounter (HOSPITAL_COMMUNITY): Payer: Self-pay | Admitting: General Practice

## 2015-05-10 ENCOUNTER — Telehealth: Payer: Self-pay | Admitting: Cardiology

## 2015-05-10 ENCOUNTER — Ambulatory Visit (INDEPENDENT_AMBULATORY_CARE_PROVIDER_SITE_OTHER): Payer: Medicare Other | Admitting: *Deleted

## 2015-05-10 ENCOUNTER — Encounter: Payer: Self-pay | Admitting: Physician Assistant

## 2015-05-10 VITALS — BP 142/60 | HR 60 | Ht 71.0 in | Wt 247.4 lb

## 2015-05-10 DIAGNOSIS — I252 Old myocardial infarction: Secondary | ICD-10-CM | POA: Diagnosis not present

## 2015-05-10 DIAGNOSIS — I4819 Other persistent atrial fibrillation: Secondary | ICD-10-CM

## 2015-05-10 DIAGNOSIS — Z951 Presence of aortocoronary bypass graft: Secondary | ICD-10-CM | POA: Diagnosis not present

## 2015-05-10 DIAGNOSIS — N179 Acute kidney failure, unspecified: Secondary | ICD-10-CM | POA: Diagnosis present

## 2015-05-10 DIAGNOSIS — E119 Type 2 diabetes mellitus without complications: Secondary | ICD-10-CM | POA: Diagnosis present

## 2015-05-10 DIAGNOSIS — Z954 Presence of other heart-valve replacement: Secondary | ICD-10-CM

## 2015-05-10 DIAGNOSIS — Z9841 Cataract extraction status, right eye: Secondary | ICD-10-CM | POA: Diagnosis not present

## 2015-05-10 DIAGNOSIS — I481 Persistent atrial fibrillation: Secondary | ICD-10-CM

## 2015-05-10 DIAGNOSIS — I129 Hypertensive chronic kidney disease with stage 1 through stage 4 chronic kidney disease, or unspecified chronic kidney disease: Secondary | ICD-10-CM | POA: Diagnosis present

## 2015-05-10 DIAGNOSIS — I480 Paroxysmal atrial fibrillation: Secondary | ICD-10-CM | POA: Diagnosis present

## 2015-05-10 DIAGNOSIS — M1990 Unspecified osteoarthritis, unspecified site: Secondary | ICD-10-CM | POA: Diagnosis present

## 2015-05-10 DIAGNOSIS — I509 Heart failure, unspecified: Secondary | ICD-10-CM

## 2015-05-10 DIAGNOSIS — I38 Endocarditis, valve unspecified: Secondary | ICD-10-CM

## 2015-05-10 DIAGNOSIS — Z952 Presence of prosthetic heart valve: Secondary | ICD-10-CM

## 2015-05-10 DIAGNOSIS — G4733 Obstructive sleep apnea (adult) (pediatric): Secondary | ICD-10-CM | POA: Diagnosis present

## 2015-05-10 DIAGNOSIS — Z96651 Presence of right artificial knee joint: Secondary | ICD-10-CM | POA: Diagnosis present

## 2015-05-10 DIAGNOSIS — I2510 Atherosclerotic heart disease of native coronary artery without angina pectoris: Secondary | ICD-10-CM | POA: Diagnosis present

## 2015-05-10 DIAGNOSIS — I495 Sick sinus syndrome: Secondary | ICD-10-CM | POA: Diagnosis present

## 2015-05-10 DIAGNOSIS — Z87891 Personal history of nicotine dependence: Secondary | ICD-10-CM

## 2015-05-10 DIAGNOSIS — Z953 Presence of xenogenic heart valve: Secondary | ICD-10-CM | POA: Diagnosis not present

## 2015-05-10 DIAGNOSIS — Z950 Presence of cardiac pacemaker: Secondary | ICD-10-CM | POA: Diagnosis not present

## 2015-05-10 DIAGNOSIS — R0602 Shortness of breath: Secondary | ICD-10-CM

## 2015-05-10 DIAGNOSIS — Z7901 Long term (current) use of anticoagulants: Secondary | ICD-10-CM

## 2015-05-10 DIAGNOSIS — I482 Chronic atrial fibrillation, unspecified: Secondary | ICD-10-CM | POA: Diagnosis present

## 2015-05-10 DIAGNOSIS — D649 Anemia, unspecified: Secondary | ICD-10-CM | POA: Diagnosis present

## 2015-05-10 DIAGNOSIS — F419 Anxiety disorder, unspecified: Secondary | ICD-10-CM | POA: Diagnosis present

## 2015-05-10 DIAGNOSIS — Z8701 Personal history of pneumonia (recurrent): Secondary | ICD-10-CM | POA: Diagnosis not present

## 2015-05-10 DIAGNOSIS — I5033 Acute on chronic diastolic (congestive) heart failure: Secondary | ICD-10-CM | POA: Diagnosis present

## 2015-05-10 DIAGNOSIS — M109 Gout, unspecified: Secondary | ICD-10-CM | POA: Diagnosis present

## 2015-05-10 DIAGNOSIS — I484 Atypical atrial flutter: Secondary | ICD-10-CM | POA: Diagnosis present

## 2015-05-10 DIAGNOSIS — Z794 Long term (current) use of insulin: Secondary | ICD-10-CM | POA: Diagnosis not present

## 2015-05-10 DIAGNOSIS — N183 Chronic kidney disease, stage 3 unspecified: Secondary | ICD-10-CM | POA: Diagnosis present

## 2015-05-10 DIAGNOSIS — R001 Bradycardia, unspecified: Secondary | ICD-10-CM | POA: Diagnosis not present

## 2015-05-10 DIAGNOSIS — I5022 Chronic systolic (congestive) heart failure: Secondary | ICD-10-CM

## 2015-05-10 HISTORY — DX: Presence of cardiac pacemaker: Z95.0

## 2015-05-10 HISTORY — DX: Reserved for inherently not codable concepts without codable children: IMO0001

## 2015-05-10 LAB — CUP PACEART INCLINIC DEVICE CHECK
Battery Remaining Longevity: 91.2 mo
Brady Statistic RA Percent Paced: 0 %
Date Time Interrogation Session: 20160517114411
Lead Channel Impedance Value: 575 Ohm
Lead Channel Pacing Threshold Amplitude: 1.25 V
Lead Channel Pacing Threshold Pulse Width: 0.5 ms
Lead Channel Sensing Intrinsic Amplitude: 12 mV
Lead Channel Sensing Intrinsic Amplitude: 3.1 mV
Lead Channel Setting Pacing Pulse Width: 0.5 ms
MDC IDC MSMT BATTERY VOLTAGE: 3.05 V
MDC IDC MSMT LEADCHNL RA IMPEDANCE VALUE: 437.5 Ohm
MDC IDC SET LEADCHNL RA PACING AMPLITUDE: 3.5 V
MDC IDC SET LEADCHNL RV PACING AMPLITUDE: 3.5 V
MDC IDC SET LEADCHNL RV SENSING SENSITIVITY: 2 mV
MDC IDC STAT BRADY RV PERCENT PACED: 92 %
Pulse Gen Serial Number: 7759894

## 2015-05-10 LAB — COMPREHENSIVE METABOLIC PANEL
ALT: 9 U/L — ABNORMAL LOW (ref 17–63)
ANION GAP: 10 (ref 5–15)
AST: 15 U/L (ref 15–41)
Albumin: 3.9 g/dL (ref 3.5–5.0)
Alkaline Phosphatase: 82 U/L (ref 38–126)
BUN: 57 mg/dL — ABNORMAL HIGH (ref 6–20)
CO2: 17 mmol/L — AB (ref 22–32)
Calcium: 9 mg/dL (ref 8.9–10.3)
Chloride: 109 mmol/L (ref 101–111)
Creatinine, Ser: 2.93 mg/dL — ABNORMAL HIGH (ref 0.61–1.24)
GFR calc non Af Amer: 19 mL/min — ABNORMAL LOW (ref >60)
GFR, EST AFRICAN AMERICAN: 23 mL/min — AB (ref >60)
GLUCOSE: 143 mg/dL — AB (ref 65–99)
Potassium: 5.1 mmol/L (ref 3.5–5.1)
Sodium: 136 mmol/L (ref 135–145)
TOTAL PROTEIN: 6.7 g/dL (ref 6.5–8.1)
Total Bilirubin: 0.9 mg/dL (ref 0.3–1.2)

## 2015-05-10 LAB — CBC WITH DIFFERENTIAL/PLATELET
Basophils Absolute: 0 10*3/uL (ref 0.0–0.1)
Basophils Relative: 0 % (ref 0–1)
Eosinophils Absolute: 0.1 10*3/uL (ref 0.0–0.7)
Eosinophils Relative: 2 % (ref 0–5)
HCT: 36.6 % — ABNORMAL LOW (ref 39.0–52.0)
Hemoglobin: 12.4 g/dL — ABNORMAL LOW (ref 13.0–17.0)
LYMPHS ABS: 0.5 10*3/uL — AB (ref 0.7–4.0)
LYMPHS PCT: 10 % — AB (ref 12–46)
MCH: 28.3 pg (ref 26.0–34.0)
MCHC: 33.9 g/dL (ref 30.0–36.0)
MCV: 83.6 fL (ref 78.0–100.0)
Monocytes Absolute: 0.5 10*3/uL (ref 0.1–1.0)
Monocytes Relative: 10 % (ref 3–12)
Neutro Abs: 3.6 10*3/uL (ref 1.7–7.7)
Neutrophils Relative %: 78 % — ABNORMAL HIGH (ref 43–77)
Platelets: 145 10*3/uL — ABNORMAL LOW (ref 150–400)
RBC: 4.38 MIL/uL (ref 4.22–5.81)
RDW: 16.1 % — ABNORMAL HIGH (ref 11.5–15.5)
WBC: 4.6 10*3/uL (ref 4.0–10.5)

## 2015-05-10 LAB — PROTIME-INR
INR: 1.58 — ABNORMAL HIGH (ref 0.00–1.49)
PROTHROMBIN TIME: 19 s — AB (ref 11.6–15.2)

## 2015-05-10 LAB — TROPONIN I
Troponin I: 0.06 ng/mL — ABNORMAL HIGH (ref <0.031)
Troponin I: 0.05 ng/mL — ABNORMAL HIGH (ref <0.031)

## 2015-05-10 LAB — GLUCOSE, CAPILLARY
Glucose-Capillary: 122 mg/dL — ABNORMAL HIGH (ref 65–99)
Glucose-Capillary: 138 mg/dL — ABNORMAL HIGH (ref 65–99)
Glucose-Capillary: 107 mg/dL — ABNORMAL HIGH (ref 65–99)

## 2015-05-10 LAB — APTT: APTT: 38 s — AB (ref 24–37)

## 2015-05-10 LAB — D-DIMER, QUANTITATIVE (NOT AT ARMC): D-Dimer, Quant: 1.56 ug/mL-FEU — ABNORMAL HIGH (ref 0.00–0.48)

## 2015-05-10 LAB — BRAIN NATRIURETIC PEPTIDE: B NATRIURETIC PEPTIDE 5: 510.1 pg/mL — AB (ref 0.0–100.0)

## 2015-05-10 MED ORDER — ZOLPIDEM TARTRATE 5 MG PO TABS
10.0000 mg | ORAL_TABLET | Freq: Every evening | ORAL | Status: DC | PRN
  Administered 2015-05-11 (×2): 10 mg via ORAL
  Filled 2015-05-10 (×2): qty 2

## 2015-05-10 MED ORDER — SODIUM CHLORIDE 0.9 % IJ SOLN
3.0000 mL | Freq: Two times a day (BID) | INTRAMUSCULAR | Status: DC
  Administered 2015-05-10 – 2015-05-12 (×5): 3 mL via INTRAVENOUS

## 2015-05-10 MED ORDER — INSULIN GLARGINE 100 UNIT/ML ~~LOC~~ SOLN
2.0000 [IU] | Freq: Every day | SUBCUTANEOUS | Status: DC
  Filled 2015-05-10 (×2): qty 0.03

## 2015-05-10 MED ORDER — DIPHENOXYLATE-ATROPINE 2.5-0.025 MG PO TABS
2.0000 | ORAL_TABLET | Freq: Four times a day (QID) | ORAL | Status: DC | PRN
  Administered 2015-05-10 – 2015-05-11 (×3): 2 via ORAL
  Filled 2015-05-10 (×3): qty 2

## 2015-05-10 MED ORDER — LISINOPRIL 20 MG PO TABS
20.0000 mg | ORAL_TABLET | Freq: Every day | ORAL | Status: DC
  Administered 2015-05-11: 20 mg via ORAL
  Filled 2015-05-10: qty 1

## 2015-05-10 MED ORDER — DIPHENOXYLATE-ATROPINE 2.5-0.025 MG PO TABS: 2.0000 | ORAL_TABLET | Freq: Four times a day (QID) | ORAL | Status: DC

## 2015-05-10 MED ORDER — METOPROLOL TARTRATE 25 MG PO TABS
25.0000 mg | ORAL_TABLET | Freq: Two times a day (BID) | ORAL | Status: DC
  Administered 2015-05-10 – 2015-05-12 (×4): 25 mg via ORAL
  Filled 2015-05-10 (×5): qty 1

## 2015-05-10 MED ORDER — RIVAROXABAN 15 MG PO TABS
15.0000 mg | ORAL_TABLET | Freq: Every day | ORAL | Status: DC
  Administered 2015-05-10 – 2015-05-11 (×2): 15 mg via ORAL
  Filled 2015-05-10 (×3): qty 1

## 2015-05-10 MED ORDER — MAGNESIUM 250 MG PO TABS: 125.0000 mg | ORAL_TABLET | Freq: Every day | ORAL | Status: DC

## 2015-05-10 MED ORDER — INSULIN ASPART 100 UNIT/ML ~~LOC~~ SOLN
0.0000 [IU] | Freq: Three times a day (TID) | SUBCUTANEOUS | Status: DC
  Administered 2015-05-10: 3 [IU] via SUBCUTANEOUS

## 2015-05-10 MED ORDER — DILTIAZEM HCL ER COATED BEADS 240 MG PO CP24
240.0000 mg | ORAL_CAPSULE | Freq: Every day | ORAL | Status: DC
  Administered 2015-05-10 – 2015-05-12 (×3): 240 mg via ORAL
  Filled 2015-05-10 (×3): qty 1

## 2015-05-10 MED ORDER — SODIUM CHLORIDE 0.9 % IV SOLN: 250.0000 mL | INTRAVENOUS | Status: DC | PRN

## 2015-05-10 MED ORDER — FUROSEMIDE 10 MG/ML IJ SOLN
40.0000 mg | Freq: Two times a day (BID) | INTRAMUSCULAR | Status: DC
  Administered 2015-05-10 – 2015-05-11 (×3): 40 mg via INTRAVENOUS
  Filled 2015-05-10 (×5): qty 4

## 2015-05-10 MED ORDER — AMLODIPINE BESYLATE 5 MG PO TABS
5.0000 mg | ORAL_TABLET | Freq: Every day | ORAL | Status: DC
  Administered 2015-05-10 – 2015-05-11 (×2): 5 mg via ORAL
  Filled 2015-05-10 (×2): qty 1

## 2015-05-10 MED ORDER — ALLOPURINOL 100 MG PO TABS
200.0000 mg | ORAL_TABLET | Freq: Every day | ORAL | Status: DC
  Administered 2015-05-11 – 2015-05-12 (×2): 200 mg via ORAL
  Filled 2015-05-10 (×3): qty 2

## 2015-05-10 MED ORDER — DIAZEPAM 5 MG PO TABS: 10.0000 mg | ORAL_TABLET | Freq: Every evening | ORAL | Status: DC | PRN

## 2015-05-10 MED ORDER — DIPHENOXYLATE-ATROPINE 2.5-0.025 MG PO TABS: 1.0000 | ORAL_TABLET | Freq: Four times a day (QID) | ORAL | Status: DC | PRN

## 2015-05-10 MED ORDER — TECHNETIUM TC 99M DIETHYLENETRIAME-PENTAACETIC ACID: 40.0000 | Freq: Once | INTRAVENOUS | Status: AC | PRN

## 2015-05-10 MED ORDER — TERAZOSIN HCL 5 MG PO CAPS
10.0000 mg | ORAL_CAPSULE | Freq: Every day | ORAL | Status: DC
  Administered 2015-05-11 – 2015-05-12 (×2): 10 mg via ORAL
  Filled 2015-05-10 (×3): qty 2

## 2015-05-10 MED ORDER — ZOLPIDEM TARTRATE 5 MG PO TABS: 5.0000 mg | ORAL_TABLET | Freq: Every evening | ORAL | Status: DC | PRN

## 2015-05-10 MED ORDER — INSULIN GLARGINE 100 UNITS/ML SOLOSTAR PEN: 2.0000 [IU] | PEN_INJECTOR | Freq: Every day | SUBCUTANEOUS | Status: DC

## 2015-05-10 MED ORDER — TECHNETIUM TO 99M ALBUMIN AGGREGATED
6.0000 | Freq: Once | INTRAVENOUS | Status: AC | PRN
  Administered 2015-05-10: 6 via INTRAVENOUS

## 2015-05-10 MED ORDER — SODIUM CHLORIDE 0.9 % IJ SOLN: 3.0000 mL | INTRAMUSCULAR | Status: DC | PRN

## 2015-05-10 MED ORDER — ACETAMINOPHEN 325 MG PO TABS
650.0000 mg | ORAL_TABLET | ORAL | Status: DC | PRN
  Administered 2015-05-11 (×3): 650 mg via ORAL
  Filled 2015-05-10 (×3): qty 2

## 2015-05-10 MED ORDER — ONDANSETRON HCL 4 MG/2ML IJ SOLN: 4.0000 mg | Freq: Four times a day (QID) | INTRAMUSCULAR | Status: DC | PRN

## 2015-05-10 MED ORDER — TRAMADOL HCL 50 MG PO TABS: 50.0000 mg | ORAL_TABLET | ORAL | Status: DC | PRN

## 2015-05-10 NOTE — Patient Instructions (Signed)
YOU HAVE BEEN ADMITTED TO [facility]; YOU WILL BE ON UN[address]

## 2015-05-10 NOTE — H&P (Signed)
History and Physical    Date:  05/10/2015   ID:  [name] Fiddler, MD, DOB September 06, 1941, MRN [id number]  PCP:  [name] Miyamoto, MD  Cardiologist:  Dr. [name]   Electrophysiologist:  Dr. Thompson Grayer   Chief Complaint  Patient presents with  . 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 He is on long-term anticoagulation with Xarelto. He has had multiple cardioversions in the past. His last cardioversion was in January 2015. 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 Echocardiogram in June 2015 demonstrated normal LV function, moderate aortic stenosis.  Repeat echocardiogram in 11/2014 demonstrated normal LV function, severe LVH, bioprosthetic AVR with mean gradient of 29 and moderate LAE. He had been admitted in March 2016 after a mechanical fall resulting in fractured seventh rib on the right. He returned to the hospital with acute renal failure and idioventricular rhythm with heart rate as low as 24. Hospitalization was complicated by Mallory-Weiss tear. He improved with hydration. He was recently seen by Dr. Mare Ferrari 04/26/15. He was back in atrial flutter with slow ventricular response. He was referred to EP.  He was seen by Dr. Rayann Heman 05/04/15.  The patient was noted to have symptomatic persistent atrial fibrillation and atypical atrial flutter with tachycardia with minimal exertion. Options included pacemaker implantation with medical rate control versus ablation. It was ultimately decided to proceed with pacemaker implantation. If this was unsuccessful, consideration could be given towards AV nodal ablation in the future. He underwent implantation of a  dual-chamber St. Jude pacemaker on 05/05/15. Postoperative chest x-ray was unremarkable. Ventricular rates were better controlled prior to discharge. He called in today with increasing shortness of breath and was added on for evaluation.  He is here today with his wife and son.  He started noticing shortness of breath the day after he left the hospital. He has dyspnea with minimal exertion. He is NYHA 3-3b. He notes orthopnea as well as PND. His wife states that the head of his bed has been raised since he came home due to shortness of breath. He denies chest discomfort. He denies syncope. He does note coughing and wheezing. This is unusual for him. He denies fever. Cough is productive of clear sputum. He denies hemoptysis. His wife notes that his eyes are puffy. He denies increased abdominal girth. He denies increasing edema. His weight is up 7 pounds by our scales.    Studies/Reports Reviewed Today:  Pacer Implant 05/05/15 CONCLUSIONS:  1. 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 No early apparent complications.   Echo 12/13/14 - Severe LVH. EF 55% to 60%. Doppler parameters are consistent with elevated ventricular end-diastolic filling pressure. - Aortic valve: Tissue AVR with no perivalvular leak. Leaflets appear calcified but not well seen Increased systolic gradients. Mean gradient 29 mmHg compared to 36 mmHg in June and peak gradients also similar 54mmHg compared to 53 mmHg in June Consider TEE if clinically indicated - Mitral valve: Calcified annulus. - Left atrium: The atrium was moderately dilated. - Atrial septum: No defect or patent foramen ovale was identified.  Carotid US 06/07/14 Bilateral ICA 1-39%  LHC 05/30/11 LM: Clot prior to takeoff of LAD and circumflex LAD: Occluded LCx: Proximal 70% RCA: Not injected; known to be occluded SVG-DX: Patent SVG-OM1/OM2:   Patent SVG-PDA: Patent L-LAD:  Patent    Past Medical History  Diagnosis Date  . Coronary heart disease     stent, CABG, RBBB  . Valvular heart disease     aortic stenosis/regurgitation  . Sarcoid 2011    pulmonary and bone marrow  . Hypercalcemia 2011    due to sarcoidiosis  . Bone marrow disease   . Diabetes mellitus   . Hemorrhagic cystitis 2012  . Gout   . HTN (hypertension)   . A-fib     permanent with tachy-brady syndrome  . Myocardial infarction     1995, 2002, 2006  . OSA (obstructive sleep apnea)     use bipap setting of 10 and 12  . Complication of anesthesia 11-16-13    required alot of versed per anesthesia with cataract surgery  . Chronic kidney disease (CKD), stage III (moderate)     lov note dr Koleen Nimrod nephrology 09-17-13 on chart  . Atrial flutter     hx of  . CHF (congestive heart failure)   . Pneumonia 1966, 2011    hx of  . Anxiety   . Osteoarthritis   . DDD (degenerative disc disease)   . Synovial cyst of lumbar spine     l3-l4, l4-l5, injections  . Anemia   . Hepatitis     mono  . Cancer     basal cell  . Right sciatic nerve pain     for block thursday 01-21-2014, injections  . Fungal toenail infection   . Biceps tendon tear     right  . Diabetes     Past Surgical History  Procedure Laterality Date  . Cataract surgery Right 2007  . Prostate surgery  oct. 2012    TURP  . Vasectomy  1977  . Redo median sternotomy, extracorporeal cirulation, avr, tricuspid valve repair  06/13/2011    AVR(23-mm Edwards pericardial Magna-Ease valve./ TVrepair (34-mm Edwards MC3 annuloplasty ring  . Coronary artery bypass graft  2006    x 5  . Cataract surgery Left 11-16-13  . Cardioversion N/A 12/30/2013    Procedure: CARDIOVERSION;  Surgeon: [name], MD;  Location: [facility] ENDOSCOPY;  Service: Cardiovascular;  Laterality: N/A;  10:49 cardioversion at 120 joules, then 150 joules, to SB  used  Lido 30mg ,  Propofol 160 mcg  . Colonoscopy N/A 01/29/2014     Procedure: COLONOSCOPY;  Surgeon: Winfield Cunas., MD;  Location: [facility];  Service: Endoscopy;  Laterality: N/A;  amanda//ja  . Cardioversion  2003, 2006, 2012, 2013  . Portacath placement    . Portacath removed    . Basal cell carcinoma excision Right 2015    ear  . Total knee arthroplasty Right 05/24/2014    Procedure: RIGHT TOTAL KNEE ARTHROPLASTY;  Surgeon: Gearlean Alf, MD;  Location: [facility] ORS;  Service: Orthopedics;  Laterality: Right;  . Ep implantable device N/A 05/05/2015    Procedure: Pacemaker Implant;  Surgeon: Thompson Grayer, MD;  Location: [facility];  Service: Cardiovascular;  Laterality: N/A;     Current Outpatient Prescriptions  Medication Sig Dispense Refill  . acetaminophen (TYLENOL) 500 MG tablet Take 1,000 mg by mouth 2 (two) times daily as needed (pain).     [name] allopurinol (ZYLOPRIM) 100 MG tablet Take 200 mg by mouth daily with breakfast.     .  amLODipine (NORVASC) 10 MG tablet TAKE 0.5 TABLET (5 MG TOTAL) BY MOUTH DAILY    . COCONUT OIL PO Apply 1 application topically daily as needed (dry skin).    [name] diazepam (VALIUM) 10 MG tablet Take 1 tablet by mouth daily as needed for anxiety or sleep. cramps  0  . diltiazem (CARDIZEM CD) 240 MG 24 hr capsule Take 1 capsule (240 mg total) by mouth daily. 30 capsule 1  . diphenoxylate-atropine (LOMOTIL) 2.5-0.025 MG per tablet Take 1-2 tablets by mouth daily as needed. stomach  1  . insulin glargine (LANTUS) 100 unit/mL SOPN Inject 2-3 Units into the skin daily after breakfast. CBG 120-140 2 units, 140-160 3 units    . lisinopril (PRINIVIL,ZESTRIL) 20 MG tablet Take 20 mg by mouth daily.    . Magnesium 250 MG TABS Take 125 mg by mouth at bedtime.    . metoprolol tartrate (LOPRESSOR) 25 MG tablet TAKE 1 TABLET BY MOUTH TWICE A DAY 60 tablet 5  . ONETOUCH VERIO test strip 1 each by Other route 2 (two) times daily. use for testing  6  . Rivaroxaban (XARELTO) 15 MG TABS tablet Take 15 mg by mouth daily with supper.      . terazosin (HYTRIN) 10 MG capsule Take 10 mg by mouth daily with breakfast.    . TERBINAFINE EX Apply 1 drop topically at bedtime. Place 1 drop on each toenail.    [name] testosterone cypionate (DEPOTESTOTERONE CYPIONATE) 200 MG/ML injection Inject 68ml intramuscularly as directed twice a month (Patient taking differently: Inject 200 mg into the muscle every 14 (fourteen) days. ) 10 mL 0  . traMADol (ULTRAM) 50 MG tablet Take 50-100 mg by mouth every 4 (four) hours as needed (pain).   0  . zolpidem (AMBIEN) 10 MG tablet Take 10 mg by mouth at bedtime as needed for sleep.      No current facility-administered medications for this visit.    Allergies:   Actos; Hydralazine; Isosorbide dinitrate; Penicillins; Amiodarone; Latex; Codeine; Depakote; Januvia; Loop diuretics; Losartan; Nsaids; Onglyza; Orudis; Pentazocine lactate; Rozerem; Sertraline hcl; Benazepril hcl; Indomethacin; Minocycline; Poison ivy extract; Potassium iodide; and Shellfish allergy    Social History:  The patient  reports that he quit smoking about 19 years ago. His smoking use included Pipe and Cigars. He has never used smokeless tobacco. He reports that he drinks alcohol. He reports that he does not use illicit drugs.   Family History:  The patient's family history includes Allergies in an other family member; Asthma in an other family member; Cancer in an other family member; Heart attack in his father; Heart disease in an other family member; Stroke in his father and mother.    ROS:   Please see the history of present illness.   Review of Systems  Cardiovascular: Positive for dyspnea on exertion, orthopnea and paroxysmal nocturnal dyspnea.  Respiratory: Positive for cough and wheezing.      PHYSICAL EXAM: VS:  BP 142/60 mmHg  Pulse 60  Ht 5\' 11"  (1.803 m)  Wt 247 lb 6.4 oz (112.22 kg)  BMI 34.52 kg/m2  SpO2 92%    Wt Readings from Last 3 Encounters:  05/10/15 247 lb 6.4 oz (112.22 kg)  05/06/15 240 lb 8.4 oz  (109.1 kg)  05/04/15 245 lb 12.8 oz (111.494 kg)     GEN: Well nourished, well developed, in no acute distress HEENT: normal Neck: + JVD,   no masses Cardiac:  Normal S1/S2, RRR; harsh 2/6 systolic murmur  at the RUSB,  no rubs or gallops, 1-2+ bilateral LE edema to the knee Respiratory:  clear to auscultation bilaterally, no wheezing, rhonchi or rales. GI: soft, nontender, nondistended, + BS MS: no deformity or atrophy Skin: warm and dry  Neuro:  CNs II-XII intact, Strength and sensation are intact Psych: Normal affect   EKG:  EKG is ordered today.  It demonstrates:  V paced, HR 60  Recent Labs: 03/23/2015: ALT 11 05/04/2015: Hemoglobin 12.3*; Platelets 175.0 05/06/2015: BUN 42*; Creatinine 2.43*; Potassium 4.5; Sodium 138    Lipid Panel    Component Value Date/Time   CHOL 106 05/28/2014 0140   TRIG 169* 05/28/2014 0140   HDL 14* 05/28/2014 0140   CHOLHDL 7.6 05/28/2014 0140   VLDL 34 05/28/2014 0140   LDLCALC 58 05/28/2014 0140      ASSESSMENT AND PLAN:  Acute on chronic diastolic CHF (congestive heart failure) Patient is volume overloaded. He is quite symptomatic with NYHA 3-3b symptoms. Oxygen level did drop to 88% with minimal ambulation the office. I recommended admission to the hospital for IV diuresis. He was also seen by Dr. Mare Ferrari today, who agreed. The patient has a history of worsening creatinine with diuresis in the past. He is chronically followed by Dr. [name].   -  Initially start with Lasix 40 mg IV Twice daily   -  Follow Creatinine closely.  -  Ask Nephrology to follow along and see him today.  -  Obtain DDimer, VQ scan to rule out PE.  He was off anticoagulation for about 1 week in March.  -  Obtain CXR.  -  Place on O2 at 2 LPM  -  Obtain FU echo to reassess LVF and AVR.  -  Obtain serial Troponins  Coronary artery disease involving native coronary artery of native heart without angina pectoris No angina.  Will obtain serial markers to r/o  ischemia.  Continue ACE inhibitor, beta blocker.  He is not on ASA as he is on Xarelto.  VALVULAR HEART DISEASE s/p Bioprosthetic AVR and TV Repair Obtain FU echo as noted. Continue SBE prophylaxis.  Sick sinus syndrome S/P placement of cardiac pacemaker PPM interrogated today. It is functioning appropriately.  His wound check was later this week and has been rescheduled for next week.   Persistent atrial fibrillation and Atypical atrial flutter He has been in AFib since 5/13.  Avg HR 100-110.  He remains on Xarelto.  Estimated Creatinine Clearance: 33.5 mL/min (by C-G formula based on Cr of 2.43).  He remains on Xarelto 15 mg QD.    Chronic kidney disease (CKD), stage III (moderate)  As noted, Nephrology will be consulted to help manage his CKD in the setting of diuresis for acute HF.  If Creatinine increases, may need to hold ACE inhibitor.   Labs/ tests ordered today include:  No orders of the defined types were placed in this encounter.    Disposition:   Admit to [facility] Telemetry bed today.  His family will transport him.   Signed, Versie Starks, MHS 05/10/2015 11:28 AM    [facility] [place], [place], [place]  [address] Phone: [phone]; Fax: [phone]

## 2015-05-10 NOTE — Progress Notes (Signed)
Pacemaker check in clinic--add on with Richardson Dopp. Pt having SOB since implant. Normal device function. Thresholds, sensing, impedances consistent with previous measurements. Device programmed to maximize longevity. Pt in AF 99% of time. Current episode started on 05-06-15 @ 720am. + Xarelto. No high ventricular rates noted. Device programmed at appropriate safety margins. Histogram distribution appropriate for patient activity level. Device programmed to optimize intrinsic conduction. Estimated longevity 5.3 to 7.6 years. Patient enrolled in remote follow-up. Pt to be scheduled for wound check next week and cancel wound check for 05-12-15. Plan per [facility] for AF.

## 2015-05-10 NOTE — Telephone Encounter (Signed)
New Message    Pt c/o Shortness Of Breath: STAT if SOB developed within the last 24 hours or pt is noticeably SOB on the phone  1. Are you currently SOB (can you hear that pt is SOB on the phone)?yes  2. How long have you been experiencing SOB? Started on Saturday and has gotten worse   3. Are you SOB when sitting or when up moving around? Moving aroung   4. Are you currently experiencing any other symptoms? No just sore where his pacer is .

## 2015-05-10 NOTE — Telephone Encounter (Signed)
Patient had Pacemaker put in on Thursday. Patient was discharged home on Friday. Saturday patient started having SOB on exertion and has become worse since Saturday. Patient denies any chest pain. Patient's SBP has been running 140-145. Patient has a wound check on Thursday, in two days, but does not think he should wait until then. Will consult Richardson Dopp PA (FLEX). Patient will be seen by Richardson Dopp PA today at 10:30.

## 2015-05-10 NOTE — Progress Notes (Signed)
Cardiology Office Note   Date:  05/10/2015   ID:  [name] Fiddler, MD, DOB Nov 18, 1941, MRN [id number]  PCP:  [name], MD  Cardiologist:  Dr. [name]   Electrophysiologist:  Dr. Thompson Grayer   Chief Complaint  Patient presents with  . 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 He is on long-term anticoagulation with Xarelto. He has had multiple cardioversions in the past. His last cardioversion was in January 2015. 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 Echocardiogram in June 2015 demonstrated normal LV function, moderate aortic stenosis.  Repeat echocardiogram in 11/2014 demonstrated normal LV function, severe LVH, bioprosthetic AVR with mean gradient of 29 and moderate LAE. He had been admitted in March 2016 after a mechanical fall resulting in fractured seventh rib on the right. He returned to the hospital with acute renal failure and idioventricular rhythm with heart rate as low as 24. Hospitalization was complicated by Mallory-Weiss tear. He improved with hydration. He was recently seen by Dr. Mare Ferrari 04/26/15. He was back in atrial flutter with slow ventricular response. He was referred to EP.  He was seen by Dr. Rayann Heman 05/04/15.  The patient was noted to have symptomatic persistent atrial fibrillation and atypical atrial flutter with tachycardia with minimal exertion. Options included pacemaker implantation with medical rate control versus ablation. It was ultimately decided to proceed with pacemaker implantation. If this was unsuccessful, consideration could be given towards AV nodal ablation in the future. He underwent implantation of a  dual-chamber St. Jude pacemaker on 05/05/15. Postoperative chest x-ray was unremarkable. Ventricular rates were better controlled prior to discharge. He called in today with increasing shortness of breath and was added on for evaluation.  He is here today with his wife and son.  He started noticing shortness of breath the day after he left the hospital. He has dyspnea with minimal exertion. He is NYHA 3-3b. He notes orthopnea as well as PND. His wife states that the head of his bed has been raised since he came home due to shortness of breath. He denies chest discomfort. He denies syncope. He does note coughing and wheezing. This is unusual for him. He denies fever. Cough is productive of clear sputum. He denies hemoptysis. His wife notes that his eyes are puffy. He denies increased abdominal girth. He denies increasing edema. His weight is up 7 pounds by our scales.    Studies/Reports Reviewed Today:  Pacer Implant 05/05/15 CONCLUSIONS:  1. 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 No early apparent complications.   Echo 12/13/14 - Severe LVH. EF 55% to 60%. Doppler parameters are consistent with elevated ventricular end-diastolic filling pressure. - Aortic valve: Tissue AVR with no perivalvular leak. Leaflets appear calcified but not well seen Increased systolic gradients. Mean gradient 29 mmHg compared to 36 mmHg in June and peak gradients also similar 77mmHg compared to 53 mmHg in June Consider TEE if clinically indicated - Mitral valve: Calcified annulus. - Left atrium: The atrium was moderately dilated. - Atrial septum: No defect or patent foramen ovale was identified.  Carotid US 06/07/14 Bilateral ICA 1-39%  LHC  05/30/11 LM: Clot prior to takeoff of LAD and circumflex LAD: Occluded LCx: Proximal 70% RCA: Not injected; known to be occluded SVG-DX: Patent SVG-OM1/OM2:   Patent SVG-PDA: Patent L-LAD:  Patent    Past Medical History  Diagnosis Date  . Coronary heart disease     stent, CABG, RBBB  . Valvular heart disease     aortic stenosis/regurgitation  . Sarcoid 2011    pulmonary and bone marrow  . Hypercalcemia 2011    due to sarcoidiosis  . Bone marrow disease   . Diabetes mellitus   . Hemorrhagic cystitis 2012  . Gout   . HTN (hypertension)   . A-fib     permanent with tachy-brady syndrome  . Myocardial infarction     1995, 2002, 2006  . OSA (obstructive sleep apnea)     use bipap setting of 10 and 12  . Complication of anesthesia 11-16-13    required alot of versed per anesthesia with cataract surgery  . Chronic kidney disease (CKD), stage III (moderate)     lov note dr Koleen Nimrod nephrology 09-17-13 on chart  . Atrial flutter     hx of  . CHF (congestive heart failure)   . Pneumonia 1966, 2011    hx of  . Anxiety   . Osteoarthritis   . DDD (degenerative disc disease)   . Synovial cyst of lumbar spine     l3-l4, l4-l5, injections  . Anemia   . Hepatitis     mono  . Cancer     basal cell  . Right sciatic nerve pain     for block thursday 01-21-2014, injections  . Fungal toenail infection   . Biceps tendon tear     right  . Diabetes     Past Surgical History  Procedure Laterality Date  . Cataract surgery Right 2007  . Prostate surgery  oct. 2012    TURP  . Vasectomy  1977  . Redo median sternotomy, extracorporeal cirulation, avr, tricuspid valve repair  06/13/2011    AVR(23-mm Edwards pericardial Magna-Ease valve./ TVrepair (34-mm Edwards MC3 annuloplasty ring  . Coronary artery bypass graft  2006    x 5  . Cataract surgery Left 11-16-13  . Cardioversion N/A 12/30/2013    Procedure: CARDIOVERSION;  Surgeon: [name], MD;  Location: [facility] ENDOSCOPY;  Service: Cardiovascular;  Laterality: N/A;  10:49 cardioversion at 120 joules, then 150 joules, to SB  used  Lido 30mg ,  Propofol 160 mcg  . Colonoscopy N/A 01/29/2014     Procedure: COLONOSCOPY;  Surgeon: Winfield Cunas., MD;  Location: [facility] ENDOSCOPY;  Service: Endoscopy;  Laterality: N/A;  amanda//ja  . Cardioversion  2003, 2006, 2012, 2013  . Portacath placement    . Portacath removed    . Basal cell carcinoma excision Right 2015    ear  . Total knee arthroplasty Right 05/24/2014    Procedure: RIGHT TOTAL KNEE ARTHROPLASTY;  Surgeon: Gearlean Alf, MD;  Location: [facility] ORS;  Service: Orthopedics;  Laterality: Right;  . Ep implantable device N/A 05/05/2015    Procedure: Pacemaker Implant;  Surgeon: Thompson Grayer, MD;  Location: [place];  Service: Cardiovascular;  Laterality: N/A;     Current Outpatient Prescriptions  Medication Sig Dispense Refill  . acetaminophen (TYLENOL) 500 MG tablet Take 1,000 mg by mouth 2 (two) times daily as needed (pain).     [name] allopurinol (ZYLOPRIM) 100 MG tablet Take 200 mg by mouth daily with breakfast.     .  amLODipine (NORVASC) 10 MG tablet TAKE 0.5 TABLET (5 MG TOTAL) BY MOUTH DAILY    . COCONUT OIL PO Apply 1 application topically daily as needed (dry skin).    [name] diazepam (VALIUM) 10 MG tablet Take 1 tablet by mouth daily as needed for anxiety or sleep. cramps  0  . diltiazem (CARDIZEM CD) 240 MG 24 hr capsule Take 1 capsule (240 mg total) by mouth daily. 30 capsule 1  . diphenoxylate-atropine (LOMOTIL) 2.5-0.025 MG per tablet Take 1-2 tablets by mouth daily as needed. stomach  1  . insulin glargine (LANTUS) 100 unit/mL SOPN Inject 2-3 Units into the skin daily after breakfast. CBG 120-140 2 units, 140-160 3 units    . lisinopril (PRINIVIL,ZESTRIL) 20 MG tablet Take 20 mg by mouth daily.    . Magnesium 250 MG TABS Take 125 mg by mouth at bedtime.    . metoprolol tartrate (LOPRESSOR) 25 MG tablet TAKE 1 TABLET BY MOUTH TWICE A DAY 60 tablet 5  . ONETOUCH VERIO test strip 1 each by Other route 2 (two) times daily. use for testing  6  . Rivaroxaban (XARELTO) 15 MG TABS tablet Take 15 mg by mouth daily with supper.      . terazosin (HYTRIN) 10 MG capsule Take 10 mg by mouth daily with breakfast.    . TERBINAFINE EX Apply 1 drop topically at bedtime. Place 1 drop on each toenail.    [name] testosterone cypionate (DEPOTESTOTERONE CYPIONATE) 200 MG/ML injection Inject 77ml intramuscularly as directed twice a month (Patient taking differently: Inject 200 mg into the muscle every 14 (fourteen) days. ) 10 mL 0  . traMADol (ULTRAM) 50 MG tablet Take 50-100 mg by mouth every 4 (four) hours as needed (pain).   0  . zolpidem (AMBIEN) 10 MG tablet Take 10 mg by mouth at bedtime as needed for sleep.      No current facility-administered medications for this visit.    Allergies:   Actos; Hydralazine; Isosorbide dinitrate; Penicillins; Amiodarone; Latex; Codeine; Depakote; Januvia; Loop diuretics; Losartan; Nsaids; Onglyza; Orudis; Pentazocine lactate; Rozerem; Sertraline hcl; Benazepril hcl; Indomethacin; Minocycline; Poison ivy extract; Potassium iodide; and Shellfish allergy    Social History:  The patient  reports that he quit smoking about 19 years ago. His smoking use included Pipe and Cigars. He has never used smokeless tobacco. He reports that he drinks alcohol. He reports that he does not use illicit drugs.   Family History:  The patient's family history includes Allergies in an other family member; Asthma in an other family member; Cancer in an other family member; Heart attack in his father; Heart disease in an other family member; Stroke in his father and mother.    ROS:   Please see the history of present illness.   Review of Systems  Cardiovascular: Positive for dyspnea on exertion, orthopnea and paroxysmal nocturnal dyspnea.  Respiratory: Positive for cough and wheezing.      PHYSICAL EXAM: VS:  BP 142/60 mmHg  Pulse 60  Ht 5\' 11"  (1.803 m)  Wt 247 lb 6.4 oz (112.22 kg)  BMI 34.52 kg/m2  SpO2 92%    Wt Readings from Last 3 Encounters:  05/10/15 247 lb 6.4 oz (112.22 kg)  05/06/15 240 lb 8.4 oz  (109.1 kg)  05/04/15 245 lb 12.8 oz (111.494 kg)     GEN: Well nourished, well developed, in no acute distress HEENT: normal Neck: + JVD,   no masses Cardiac:  Normal S1/S2, RRR; harsh 2/6 systolic murmur  at the RUSB,  no rubs or gallops, 1-2+ bilateral LE edema to the knee Respiratory:  clear to auscultation bilaterally, no wheezing, rhonchi or rales. GI: soft, nontender, nondistended, + BS MS: no deformity or atrophy Skin: warm and dry  Neuro:  CNs II-XII intact, Strength and sensation are intact Psych: Normal affect   EKG:  EKG is ordered today.  It demonstrates:  V paced, HR 60  Recent Labs: 03/23/2015: ALT 11 05/04/2015: Hemoglobin 12.3*; Platelets 175.0 05/06/2015: BUN 42*; Creatinine 2.43*; Potassium 4.5; Sodium 138    Lipid Panel    Component Value Date/Time   CHOL 106 05/28/2014 0140   TRIG 169* 05/28/2014 0140   HDL 14* 05/28/2014 0140   CHOLHDL 7.6 05/28/2014 0140   VLDL 34 05/28/2014 0140   LDLCALC 58 05/28/2014 0140      ASSESSMENT AND PLAN:  Acute on chronic diastolic CHF (congestive heart failure) Patient is volume overloaded. He is quite symptomatic with NYHA 3-3b symptoms. Oxygen level did drop to 88% with minimal ambulation the office. I recommended admission to the hospital for IV diuresis. He was also seen by Dr. Mare Ferrari today, who agreed. The patient has a history of worsening creatinine with diuresis in the past. He is chronically followed by Dr. [name].   -  Initially start with Lasix 40 mg IV Twice daily   -  Follow Creatinine closely.  -  Ask Nephrology to follow along and see him today.  -  Obtain DDimer, VQ scan to rule out PE.  He was off anticoagulation for about 1 week in March.  -  Obtain CXR.  -  Place on O2 at 2 LPM  -  Obtain FU echo to reassess LVF and AVR.  -  Obtain serial Troponins  Coronary artery disease involving native coronary artery of native heart without angina pectoris No angina.  Will obtain serial markers to r/o  ischemia.  Continue ACE inhibitor, beta blocker.  He is not on ASA as he is on Xarelto.  VALVULAR HEART DISEASE s/p Bioprosthetic AVR and TV Repair Obtain FU echo as noted. Continue SBE prophylaxis.  Sick sinus syndrome S/P placement of cardiac pacemaker PPM interrogated today. It is functioning appropriately.  His wound check was later this week and has been rescheduled for next week.   Persistent atrial fibrillation and Atypical atrial flutter He has been in AFib since 5/13.  Avg HR 100-110.  He remains on Xarelto.  Estimated Creatinine Clearance: 33.5 mL/min (by C-G formula based on Cr of 2.43).  He remains on Xarelto 15 mg QD.    Chronic kidney disease (CKD), stage III (moderate)  As noted, Nephrology will be consulted to help manage his CKD in the setting of diuresis for acute HF.  If creatinine increases, will need to hold ACE inhibitor.   Labs/ tests ordered today include:  No orders of the defined types were placed in this encounter.    Disposition:   Admit to [facility] Telemetry bed today.  His family will transport him.   Signed, Versie Starks, MHS 05/10/2015 11:28 AM    [facility] [address], [place], [place]  [address] Phone: [phone]; Fax: [phone]

## 2015-05-11 ENCOUNTER — Inpatient Hospital Stay (HOSPITAL_COMMUNITY): Payer: Medicare Other

## 2015-05-11 DIAGNOSIS — I2510 Atherosclerotic heart disease of native coronary artery without angina pectoris: Secondary | ICD-10-CM

## 2015-05-11 DIAGNOSIS — I38 Endocarditis, valve unspecified: Secondary | ICD-10-CM

## 2015-05-11 DIAGNOSIS — I509 Heart failure, unspecified: Secondary | ICD-10-CM

## 2015-05-11 LAB — BASIC METABOLIC PANEL
Anion gap: 9 (ref 5–15)
BUN: 54 mg/dL — AB (ref 6–20)
CO2: 19 mmol/L — AB (ref 22–32)
CREATININE: 2.77 mg/dL — AB (ref 0.61–1.24)
Calcium: 9.1 mg/dL (ref 8.9–10.3)
Chloride: 111 mmol/L (ref 101–111)
GFR calc Af Amer: 24 mL/min — ABNORMAL LOW (ref >60)
GFR calc non Af Amer: 21 mL/min — ABNORMAL LOW (ref >60)
GLUCOSE: 100 mg/dL — AB (ref 65–99)
Potassium: 4.8 mmol/L (ref 3.5–5.1)
Sodium: 139 mmol/L (ref 135–145)

## 2015-05-11 LAB — GLUCOSE, CAPILLARY
GLUCOSE-CAPILLARY: 87 mg/dL (ref 65–99)
GLUCOSE-CAPILLARY: 132 mg/dL — AB (ref 65–99)
GLUCOSE-CAPILLARY: 162 mg/dL — AB (ref 65–99)
Glucose-Capillary: 136 mg/dL — ABNORMAL HIGH (ref 65–99)

## 2015-05-11 LAB — CLOSTRIDIUM DIFFICILE BY PCR: CDIFFPCR: NEGATIVE

## 2015-05-11 LAB — TROPONIN I: TROPONIN I: 0.06 ng/mL — AB (ref <0.031)

## 2015-05-11 MED ORDER — AMLODIPINE BESYLATE 10 MG PO TABS
10.0000 mg | ORAL_TABLET | Freq: Every day | ORAL | Status: DC
  Filled 2015-05-11: qty 1

## 2015-05-11 MED ORDER — DIPHENOXYLATE-ATROPINE 2.5-0.025 MG PO TABS
2.0000 | ORAL_TABLET | Freq: Three times a day (TID) | ORAL | Status: DC
  Administered 2015-05-11 – 2015-05-12 (×3): 2 via ORAL
  Filled 2015-05-11 (×3): qty 2

## 2015-05-11 NOTE — Progress Notes (Signed)
  Echocardiogram 2D Echocardiogram has been performed.  Lysle Rubens 05/11/2015, 10:12 AM

## 2015-05-11 NOTE — Progress Notes (Signed)
error 

## 2015-05-11 NOTE — Progress Notes (Signed)
Utilization review completed. Terrez Ander, RN, BSN. 

## 2015-05-11 NOTE — Progress Notes (Signed)
Pt wearing home CPAP when RT arrived. Pt was sleeping and resting comfortably. RT will monitor.

## 2015-05-11 NOTE — Progress Notes (Signed)
MEDICATION RELATED NOTE  Medications:  Scheduled:  . allopurinol  200 mg Oral Q breakfast  . [START ON 05/12/2015] amLODipine  10 mg Oral Daily  . diltiazem  240 mg Oral Daily  . diphenoxylate-atropine  2 tablet Oral TID AC  . furosemide  40 mg Intravenous BID  . insulin aspart  0-20 Units Subcutaneous TID WC  . insulin glargine  2-3 Units Subcutaneous Daily  . metoprolol tartrate  25 mg Oral BID  . Rivaroxaban  15 mg Oral Q supper  . sodium chloride  3 mL Intravenous Q12H  . terazosin  10 mg Oral Q breakfast   Assessment: [name] requested to speak with a pharmacist regarding possible drug interactions or adverse drug effects that may be contributing to his diarrhea. The following medications have diarrhea listed as an adverse effect but at an undefined frequency or less than 5%:  Allopurinol Diltiazem CD Lasix Lantus Metoprolol  However, the patient has been on these chronically. Diarrhea has only been present in the last 6-9 months. In addition, the frequency of diarrhea as a side effect of the listed medications is very low. [name] did relay that he has required several orthopedic surgeries or had orthopedic injuries during the past year where he was on opioids on and off. The most recent time he was on opioids was for an injury that occurred in March. So he has been off opioids for a few weeks. His diarrhea may be related to this as a sort of 'rebound' effect after discontinuing a constipation inducing drug. However, he continues to have problems.   He is currently on PRN lomotil. He received 2 doses of 2 tablets yesterday and 1 dose today. The maximum dose is 8 tablets per day.   Goal of Therapy:  Reduce or eliminate diarrhea  Plan:  - Change lomotil to 2 tablets PO scheduled TID (obtained verbal order and dose has been changed) - Re-assess after pt has received 24 to 48 hours of therapy then titrate up to a max dose of 2 tablets PO QID depending upon response. -  Consider adding additional agents if no response   Salome Arnt, PharmD, BCPS Pager # [phone] 05/11/2015 1:37 PM

## 2015-05-11 NOTE — Progress Notes (Signed)
Patient Name: [name], MD Date of Encounter: 05/11/2015  Cardiologist: Dr. [name]  Electrophysiologist: Dr. Thompson Grayer    Principal Problem:   Acute on chronic diastolic CHF (congestive heart failure) Active Problems:   Chronic kidney disease (CKD), stage III (moderate)   Persistent atrial fibrillation   CAD (coronary artery disease)   VALVULAR HEART DISEASE   Sick sinus syndrome   S/P placement of cardiac pacemaker    SUBJECTIVE  The patient has mildly improved SOB today, he is undergoing echocardiography right now.  CURRENT MEDS . allopurinol  200 mg Oral Q breakfast  . amLODipine  5 mg Oral Daily  . diltiazem  240 mg Oral Daily  . furosemide  40 mg Intravenous BID  . insulin aspart  0-20 Units Subcutaneous TID WC  . insulin glargine  2-3 Units Subcutaneous Daily  . lisinopril  20 mg Oral Daily  . metoprolol tartrate  25 mg Oral BID  . Rivaroxaban  15 mg Oral Q supper  . sodium chloride  3 mL Intravenous Q12H  . terazosin  10 mg Oral Q breakfast    OBJECTIVE  Filed Vitals:   05/10/15 2145 05/10/15 2220 05/11/15 0221 05/11/15 0654  BP: 152/51  134/57 143/69  Pulse: 59 60 59 60  Temp: 98.4 F (36.9 C)  98.4 F (36.9 C) 97.6 F (36.4 C)  TempSrc: Oral  Oral Oral  Resp: 16  17 18   Weight:    238 lb 4.8 oz (108.092 kg)  SpO2: 97%  93% 94%    Intake/Output Summary (Last 24 hours) at 05/11/15 0833 Last data filed at 05/11/15 0655  Gross per 24 hour  Intake    480 ml  Output   1725 ml  Net  -1245 ml   Filed Weights   05/10/15 1226 05/11/15 0654  Weight: 245 lb 6.4 oz (111.313 kg) 238 lb 4.8 oz (108.092 kg)    PHYSICAL EXAM  General: Pleasant, NAD. Neuro: Alert and oriented X 3. Moves all extremities spontaneously. Psych: Normal affect. HEENT:  Normal  Neck: Supple without bruits or JVD. Lungs:  Resp regular and unlabored, CTA. Heart: RRR no s3, s4, or murmurs. Abdomen: Soft, non-tender, non-distended, BS + x 4.    Extremities: No clubbing, cyanosis or edema. DP/PT/Radials 2+ and equal bilaterally.  Accessory Clinical Findings  CBC  Recent Labs  05/10/15 1310  WBC 4.6  NEUTROABS 3.6  HGB 12.4*  HCT 36.6*  MCV 83.6  PLT 115*   Basic Metabolic Panel  Recent Labs  05/10/15 1310 05/11/15 0036  NA 136 139  K 5.1 4.8  CL 109 111  CO2 17* 19*  GLUCOSE 143* 100*  BUN 57* 54*  CREATININE 2.93* 2.77*  CALCIUM 9.0 9.1   Liver Function Tests  Recent Labs  05/10/15 1310  AST 15  ALT 9*  ALKPHOS 82  BILITOT 0.9  PROT 6.7  ALBUMIN 3.9   Cardiac Enzymes  Recent Labs  05/10/15 1310 05/10/15 1825 05/11/15 0036  TROPONINI 0.06* 0.05* 0.06*   D-Dimer  Recent Labs  05/10/15 1310  DDIMER 1.56*   TELE Intermittent pacing    ECG 05/10/15 V-pacing at 60 bpm.  Echocardiogram  Pending    Radiology/Studies  Dg Chest 2 View  05/10/2015   CLINICAL DATA:  Shortness of breath.  EXAM: CHEST  2 VIEW  COMPARISON:  05/06/2015.  FINDINGS: Stable enlarged cardiac silhouette and prominent pulmonary vasculature and interstitial markings. Small bilateral pleural effusions, with improvement. Diffuse peribronchial  thickening without significant change. Stable post CABG changes and left subclavian pacemaker leads. Thoracic spine degenerative changes.  IMPRESSION: 1. Stable mild changes of congestive heart failure with the exception of a mild decrease in size of small bilateral pleural effusions. 2. Stable cardiomegaly.   Electronically Signed   By: Claudie Revering M.D.   On: 05/10/2015 15:27   Dg Chest 2 View  05/06/2015   CLINICAL DATA:  Cardiac pacer.  EXAM: CHEST  2 VIEW  COMPARISON:  05/26/2014 .  FINDINGS: Mediastinum hilar structures are normal. Prior CABG. Cardiac pacer with lead tips in right atrium and right ventricle . Cardiomegaly with pulmonary vascular prominence. Diffuse pulmonary interstitial prominence noted. Findings consistent with mild congestive heart failure. Small right  pleural effusion cannot be excluded. Carotid vascular calcification.  IMPRESSION: 1. Cardiac pacer with lead tips in right atrium and right ventricle. No complicating features. Prior CABG.  2. Congestive heart failure with mild pulmonary interstitial edema and small right pleural effusion.   Electronically Signed   By: Marcello Moores  Register   On: 05/06/2015 07:29   Nm Pulmonary Perf And Vent  05/10/2015   CLINICAL DATA:  Shortness of breath.  EXAM: NUCLEAR MEDICINE VENTILATION - PERFUSION LUNG SCAN  TECHNIQUE: Ventilation images were obtained in multiple projections using inhaled aerosol Tc-67m DTPA. Perfusion images were obtained in multiple projections after intravenous injection of Tc-78m MAA.  RADIOPHARMACEUTICALS:  40 Technetium-70m DTPA aerosol inhalation and 6 Technetium-37m MAA IV  COMPARISON:  Chest radiographs obtained today.  FINDINGS: Ventilation: Normal ventilation of both lungs.  Perfusion: Normal perfusion of both lungs.  No perfusion defects.  IMPRESSION: Normal examination.  No evidence of pulmonary embolism.   Electronically Signed   By: Claudie Revering M.D.   On: 05/10/2015 15:23    ASSESSMENT AND PLAN  1. Acute on chronic diastolic CHF (congestive heart failure) - Cont Lasix 40 mg IV Twice dailyand 02 - Follow Creatinine closely. Nephrology consulting. - Positive DDimer, No PE per VQ scan 05/10/15. -Per CXR: stable mild changes of congestive heart failure with the exception of a mild decrease in size of small bilateral pleural effusions. -Echo ordered -Troponins in range of 0.05-0.06, but not trending upwards  - Down 7 pounds by our scales, will inquire as to method of measurement (bed vs standing)  2. CAD s/p CABG in 2006  - No angina.   - Troponins as above  - Continue ACE inhibitor, beta blocker. He is not on ASA as he is on Xarelto.  3. VALVULAR HEART DISEASE s/p Bioprosthetic AVR and TV Repair  -  Echo to be performed today.   4. Sick sinus syndrome S/P St Jude dual chamber PPM 05/05/2015  - PPM interrogated 05/10/2015. It is functioning appropriately.  - His wound check the week of 05/11/15 and has been rescheduled for the week of 05/18/15  5. Persistent atrial fibrillation and Atypical atrial flutter  - He has been in AFib since 05/06/15. Avg HR 100-110.   - Cont Xarelto.H/o intolerance to amio due to diarrhea   - continue diltiazem and metoprolol.  6. Chronic kidney disease (CKD), stage III (moderate)   - Estimated Creatinine Clearance: 33.5 mL/min (by C-G formula based on Cr of 2.43).   - Per Nephrology consult to help manage his CKD in the setting of diuresis for acute HF.   - If worsening Creatinine, may need to hold ACE inhibitor.  Ruben Im Pager: [phone]  74 year old male admitted from the clinic yesterday with  acute on chronic diastolic CHF. Mild troponin elevation, flat trend. He was discharged on 5/13 after pacemaker implantation  For SSS. He diuresed 1.2 L overnight with mild clinical improvement. No diuretics at home. He also has acute on CKD, mildly improved overnight. 2.2 -> 2.9 -> 2.7. I would discontinue lisinopril and increase amlodipine to 10 mg po daily.  We will follow after echo is completed.    Dorothy Spark 05/11/2015

## 2015-05-12 ENCOUNTER — Ambulatory Visit: Payer: Medicare Other

## 2015-05-12 ENCOUNTER — Other Ambulatory Visit: Payer: Self-pay | Admitting: Physician Assistant

## 2015-05-12 DIAGNOSIS — N183 Chronic kidney disease, stage 3 (moderate): Secondary | ICD-10-CM

## 2015-05-12 LAB — BASIC METABOLIC PANEL
Anion gap: 10 (ref 5–15)
BUN: 47 mg/dL — AB (ref 6–20)
CHLORIDE: 108 mmol/L (ref 101–111)
CO2: 20 mmol/L — AB (ref 22–32)
Calcium: 9.1 mg/dL (ref 8.9–10.3)
Creatinine, Ser: 2.81 mg/dL — ABNORMAL HIGH (ref 0.61–1.24)
GFR calc Af Amer: 24 mL/min — ABNORMAL LOW (ref >60)
GFR calc non Af Amer: 21 mL/min — ABNORMAL LOW (ref >60)
GLUCOSE: 164 mg/dL — AB (ref 65–99)
POTASSIUM: 4.4 mmol/L (ref 3.5–5.1)
Sodium: 138 mmol/L (ref 135–145)

## 2015-05-12 LAB — GLUCOSE, CAPILLARY
GLUCOSE-CAPILLARY: 198 mg/dL — AB (ref 65–99)
Glucose-Capillary: 116 mg/dL — ABNORMAL HIGH (ref 65–99)

## 2015-05-12 MED ORDER — FUROSEMIDE 40 MG PO TABS: 40.0000 mg | ORAL_TABLET | Freq: Every day | ORAL | Status: DC

## 2015-05-12 MED ORDER — DILTIAZEM HCL ER COATED BEADS 360 MG PO CP24: 360.0000 mg | ORAL_CAPSULE | Freq: Every day | ORAL | Status: DC

## 2015-05-12 NOTE — Discharge Summary (Signed)
Physician Discharge Summary      Cardiologist:  Mare Ferrari Patient ID: [name], MD MRN: [id number] DOB/AGE: 76-Feb-1942 76 y.o.  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 He is on long-term anticoagulation with Xarelto. He has had multiple cardioversions in the past. His last cardioversion was in January 2015. 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 Echocardiogram in June 2015 demonstrated normal LV function, moderate aortic stenosis. Repeat echocardiogram in 11/2014 demonstrated normal LV function, severe LVH, bioprosthetic AVR with mean gradient of 29 and moderate LAE. He had been admitted in March 2016 after a mechanical fall resulting in fractured seventh rib on the right. He returned to the hospital with acute renal failure and idioventricular rhythm with heart rate as low as 24. Hospitalization was complicated by Mallory-Weiss tear. He improved with hydration. He was recently seen by Dr. Mare Ferrari 04/26/15. He was back in atrial flutter with slow ventricular response. He was referred to EP.  He was seen by Dr. Rayann Heman 05/04/15. The patient was noted to have symptomatic persistent atrial fibrillation and  atypical atrial flutter with tachycardia with minimal exertion. Options included pacemaker implantation with medical rate control versus ablation. It was ultimately decided to proceed with pacemaker implantation. If this was unsuccessful, consideration could be given towards AV nodal ablation in the future. He underwent implantation of a dual-chamber St. Jude pacemaker on 05/05/15. Postoperative chest x-ray was unremarkable. Ventricular rates were better controlled prior to discharge. He called in today with increasing shortness of breath and was added on for evaluation.  He is here today with his wife and son. He started noticing shortness of breath the day after he left the hospital. He has dyspnea with minimal exertion. He is NYHA 3-3b. He notes orthopnea as well as PND. His wife states that the head of his bed has been raised since he came home due to shortness of breath. He denies chest discomfort. He denies syncope. He does note coughing and wheezing. This is unusual for him. He denies fever. Cough is productive of clear sputum. He denies hemoptysis. His wife notes that his eyes are puffy. He denies increased abdominal girth. He denies increasing edema. His weight is up 7 pounds by our scales.  He was admitted for IV diuresis and ultimately lost 2 liters.  He was changed to PO lasix 40 gm Daily.  Scr decreased from 2.93 to 2.77.  Lisinopril was stopped.  He was changed from norvasc to cardizem, which was increased to 360 daily, due to persistent A fib RVR with improvement in HR. Metoprolol continued.  Recently placed PPM was working properly.(amiodarone intolerance).   He is not on ASA because he is on Xarelto 15mg , which is the renal dose.  Ambulatory O2 sats were above 92% on room air.  Echo revealed and EF of 55-60%, normal wall motion, Mild MR, LA mod dilation, moderate TR.  The patient was seen by Dr. Meda Coffee who felt he was stable for DC home.  Follow up [facility] on the 25th.     Consults:  None  Significant Diagnostic Studies:  Echocardiogram Study Conclusions  - Left ventricle: The cavity size was normal. Wall thickness was increased in a pattern of mild LVH. Systolic function was normal. The estimated ejection fraction was in the range of 55% to 60%. Wall motion was normal; there were no regional wall motion abnormalities. Doppler parameters are consistent with high ventricular filling pressure. - Aortic valve: A bioprosthesis was present. Valve area (VTI): 0.82 cm^2. Valve area (Vmax): 0.82 cm^2. Valve area (Vmean): 0.93 cm^2. - Mitral valve: Calcified annulus. There was mild regurgitation. - Left atrium: The atrium was moderately dilated. - Right atrium: The atrium was mildly dilated. - Tricuspid valve: There was moderate regurgitation.  Treatments: See above  Discharge Exam: Blood pressure 136/61, pulse 59, temperature 97.7 F (36.5 C), temperature source Oral, resp. rate 18, weight 234 lb 6.4 oz (106.323 kg), SpO2 95 %.   Disposition: 01-Home or Self Care      Discharge Instructions    Diet - low sodium heart healthy    Complete by:  As directed      Discharge instructions    Complete by:  As directed   Monitor your weight every morning.  If you gain 3 pounds in 24 hours, or 5 pounds in a week, call the office for instructions.     Increase activity slowly    Complete by:  As directed             Medication List    STOP taking these medications        amLODipine 10 MG tablet  Commonly known as:  NORVASC     lisinopril 20 MG tablet  Commonly known as:  PRINIVIL,ZESTRIL      TAKE these medications        acetaminophen 500 MG tablet  Commonly known as:  TYLENOL  Take 1,000 mg by mouth 2 (two) times daily as needed (pain).     allopurinol 100 MG tablet  Commonly known as:  ZYLOPRIM  Take 200 mg by mouth daily with breakfast.     COCONUT OIL PO  Apply 1 application topically daily as needed (dry skin).     diazepam 10 MG  tablet  Commonly known as:  VALIUM  Take 1 tablet by mouth daily as needed for anxiety or sleep. Ankle and foot cramps     diltiazem 360 MG 24 hr capsule  Commonly known as:  CARDIZEM CD  Take 1 capsule (360 mg total) by mouth daily.  Start taking on:  05/13/2015     diphenoxylate-atropine 2.5-0.025 MG per tablet  Commonly known as:  LOMOTIL  Take 1-2 tablets by mouth daily as needed. stomach     furosemide 40 MG tablet  Commonly known as:  LASIX  Take 1 tablet (40 mg total) by mouth daily.     insulin glargine 100 unit/mL Sopn  Commonly known as:  LANTUS  Inject 2-3 Units into the skin daily after breakfast. CBG 120-140 2 units, 140-160 3 units     Magnesium 250 MG Tabs  Take 125 mg by mouth at bedtime.     metoprolol tartrate 25 MG tablet  Commonly known as:  LOPRESSOR  TAKE 1 TABLET BY MOUTH TWICE A DAY  ONETOUCH VERIO test strip  Generic drug:  glucose blood  1 each by Other route 2 (two) times daily. use for testing     Rivaroxaban 15 MG Tabs tablet  Commonly known as:  XARELTO  Take 15 mg by mouth daily with supper.     terazosin 10 MG capsule  Commonly known as:  HYTRIN  Take 10 mg by mouth daily with breakfast.     TERBINAFINE EX  Apply 1 drop topically at bedtime. Place 1 drop on each toenail.     testosterone cypionate 200 MG/ML injection  Commonly known as:  DEPOTESTOTERONE CYPIONATE  Inject 23ml intramuscularly as directed twice a month     traMADol 50 MG tablet  Commonly known as:  ULTRAM  Take 50-100 mg by mouth every 4 (four) hours as needed (pain).     zolpidem 10 MG tablet  Commonly known as:  AMBIEN  Take 10 mg by mouth at bedtime as needed for sleep.       Follow-up Information    Follow up with Richardson Dopp, PA-C On 05/20/2015.   Specialties:  Physician Assistant, Radiology, Interventional Cardiology   Why:  9:30 AM    Contact information:   [address] [place] [address] [phone]       Follow up with  [facility] On 05/18/2015.   Why:  Go anytime   Contact information:   [address] [place] [address] [phone]      Follow up with Donetta Potts, MD On 05/13/2015.   Specialty:  Nephrology   Why:  10:00 am   Contact information:   [place] [address] [phone]      Greater than 30 minutes was spent completing the patient's discharge.    SignedTarri Fuller, [place] 05/12/2015, 3:13 PM

## 2015-05-12 NOTE — Progress Notes (Signed)
Pt is being discharged home. Pt has been provided with discharge instructions. RN answered all questions the patient had. Pt is being transported home by his wife who is at the bedside.

## 2015-05-12 NOTE — Progress Notes (Signed)
SATURATION QUALIFICATIONS: (This note is used to comply with regulatory documentation for home oxygen)  Patient Saturations on Room Air at Rest = 97%  Patient Saturations on Room Air while Ambulating = 93%  Patient ambulated 200  Feet and maintained his O2 level greater than 92%.

## 2015-05-12 NOTE — Progress Notes (Signed)
Patient Name: [name] Fiddler, MD Date of Encounter: 05/12/2015  Cardiologist: Dr. [name]  Electrophysiologist: Dr. Thompson Grayer    Principal Problem:   Acute on chronic diastolic CHF (congestive heart failure) Active Problems:   VALVULAR HEART DISEASE   Chronic kidney disease (CKD), stage III (moderate)   Persistent atrial fibrillation   Sick sinus syndrome   S/P placement of cardiac pacemaker   CAD (coronary artery disease)   SUBJECTIVE  The patient has SOB today, he has questions and suggestions regarding is medications, he denies SOB, dizziness or palpitations.   CURRENT MEDS . allopurinol  200 mg Oral Q breakfast  . amLODipine  10 mg Oral Daily  . diltiazem  240 mg Oral Daily  . diphenoxylate-atropine  2 tablet Oral TID AC  . furosemide  40 mg Intravenous BID  . insulin aspart  0-20 Units Subcutaneous TID WC  . insulin glargine  2-3 Units Subcutaneous Daily  . metoprolol tartrate  25 mg Oral BID  . Rivaroxaban  15 mg Oral Q supper  . sodium chloride  3 mL Intravenous Q12H  . terazosin  10 mg Oral Q breakfast   OBJECTIVE  Filed Vitals:   05/11/15 0654 05/11/15 1401 05/11/15 2206 05/12/15 0627  BP: 143/69 136/62 115/50 153/68  Pulse: 60 61 59 66  Temp: 97.6 F (36.4 C) 97.7 F (36.5 C) 97.7 F (36.5 C) 98.3 F (36.8 C)  TempSrc: Oral Oral Oral Oral  Resp: 18 18 20 18   Weight: 238 lb 4.8 oz (108.092 kg)   234 lb 6.4 oz (106.323 kg)  SpO2: 94% 92% 96% 94%    Intake/Output Summary (Last 24 hours) at 05/12/15 1141 Last data filed at 05/12/15 0835  Gross per 24 hour  Intake    700 ml  Output   2000 ml  Net  -1300 ml   Filed Weights   05/10/15 1226 05/11/15 0654 05/12/15 0627  Weight: 245 lb 6.4 oz (111.313 kg) 238 lb 4.8 oz (108.092 kg) 234 lb 6.4 oz (106.323 kg)    PHYSICAL EXAM  General: Pleasant, NAD. Neuro: Alert and oriented X 3. Moves all extremities spontaneously. Psych: Normal affect. HEENT:  Normal  Neck: Supple without  bruits or JVD. Lungs:  Resp regular and unlabored, CTA. Heart: RRR no s3, s4, or murmurs. Abdomen: Soft, non-tender, non-distended, BS + x 4.  Extremities: No clubbing, cyanosis or edema. DP/PT/Radials 2+ and equal bilaterally.  Accessory Clinical Findings  CBC  Recent Labs  05/10/15 1310  WBC 4.6  NEUTROABS 3.6  HGB 12.4*  HCT 36.6*  MCV 83.6  PLT 220*   Basic Metabolic Panel  Recent Labs  05/10/15 1310 05/11/15 0036  NA 136 139  K 5.1 4.8  CL 109 111  CO2 17* 19*  GLUCOSE 143* 100*  BUN 57* 54*  CREATININE 2.93* 2.77*  CALCIUM 9.0 9.1   Liver Function Tests  Recent Labs  05/10/15 1310  AST 15  ALT 9*  ALKPHOS 82  BILITOT 0.9  PROT 6.7  ALBUMIN 3.9   Cardiac Enzymes  Recent Labs  05/10/15 1310 05/10/15 1825 05/11/15 0036  TROPONINI 0.06* 0.05* 0.06*   D-Dimer  Recent Labs  05/10/15 1310  DDIMER 1.56*   TELE Intermittent pacing    ECG 05/10/15 V-pacing at 60 bpm.  Echocardiogram: 05/11/2015  Left ventricle: The cavity size was normal. Wall thickness was increased in a pattern of mild LVH. Systolic function was normal. The estimated ejection fraction was in the range  of 55% to 60%. Wall motion was normal; there were no regional wall motion abnormalities. Doppler parameters are consistent with high ventricular filling pressure. - Aortic valve: A bioprosthesis was present. Valve area (VTI): 0.82 cm^2. Valve area (Vmax): 0.82 cm^2. Valve area (Vmean): 0.93 cm^2. - Mitral valve: Calcified annulus. There was mild regurgitation. - Left atrium: The atrium was moderately dilated. - Right atrium: The atrium was mildly dilated. - Tricuspid valve: There was moderate regurgitation.   Radiology/Studies  Dg Chest 2 View  05/10/2015   CLINICAL DATA:  Shortness of breath.  EXAM: CHEST  2 VIEW  COMPARISON:  05/06/2015.  FINDINGS: Stable enlarged cardiac silhouette and prominent pulmonary vasculature and interstitial markings. Small  bilateral pleural effusions, with improvement. Diffuse peribronchial thickening without significant change. Stable post CABG changes and left subclavian pacemaker leads. Thoracic spine degenerative changes.  IMPRESSION: 1. Stable mild changes of congestive heart failure with the exception of a mild decrease in size of small bilateral pleural effusions. 2. Stable cardiomegaly.   Electronically Signed   By: Claudie Revering M.D.   On: 05/10/2015 15:27   Dg Chest 2 View  05/06/2015   CLINICAL DATA:  Cardiac pacer.  EXAM: CHEST  2 VIEW  COMPARISON:  05/26/2014 .  FINDINGS: Mediastinum hilar structures are normal. Prior CABG. Cardiac pacer with lead tips in right atrium and right ventricle . Cardiomegaly with pulmonary vascular prominence. Diffuse pulmonary interstitial prominence noted. Findings consistent with mild congestive heart failure. Small right pleural effusion cannot be excluded. Carotid vascular calcification.  IMPRESSION: 1. Cardiac pacer with lead tips in right atrium and right ventricle. No complicating features. Prior CABG.  2. Congestive heart failure with mild pulmonary interstitial edema and small right pleural effusion.   Electronically Signed   By: Marcello Moores  Register   On: 05/06/2015 07:29   Nm Pulmonary Perf And Vent  05/10/2015   CLINICAL DATA:  Shortness of breath.  EXAM: NUCLEAR MEDICINE VENTILATION - PERFUSION LUNG SCAN  TECHNIQUE: Ventilation images were obtained in multiple projections using inhaled aerosol Tc-13m DTPA. Perfusion images were obtained in multiple projections after intravenous injection of Tc-50m MAA.  RADIOPHARMACEUTICALS:  40 Technetium-6m DTPA aerosol inhalation and 6 Technetium-5m MAA IV  COMPARISON:  Chest radiographs obtained today.  FINDINGS: Ventilation: Normal ventilation of both lungs.  Perfusion: Normal perfusion of both lungs.  No perfusion defects.  IMPRESSION: Normal examination.  No evidence of pulmonary embolism.   Electronically Signed   By: Claudie Revering M.D.    On: 05/10/2015 15:23    ASSESSMENT AND PLAN  1. Acute on chronic diastolic CHF (congestive heart failure) - discontinue iv Lasix, start lasix 40 mg po daily, on no diuretics prior to admission, echo stable LVEF, elevated filling pressures, his new baseline is 234 lbs, instructed to call if weight 3 lbs overnight or 5 lbs in 1 week  - held lisinopril  - Crea 2.2 --> 2.9 --> 2.7 - Nephrology follow up - we will arrange - Positive DDimer, No PE per VQ scan 05/10/15. -Per CXR: stable mild changes of congestive heart failure with the exception of a mild decrease in size of small bilateral pleural effusions. -Troponins in range of 0.05-0.06, but not trending upwards  - Down 7 pounds by our scales, will inquire as to method of measurement (bed vs standing)  2. CAD s/p CABG in 2006  - No angina.   - Troponins as above  - Continue beta blocker.Held lisinopril. He is not on ASA as he is on Xarelto.  3. VALVULAR HEART DISEASE s/p Bioprosthetic AVR and TV Repair  - stable  4. Sick sinus syndrome S/P St Jude dual chamber PPM 05/05/2015  - PPM interrogated 05/10/2015. It is functioning appropriately.  - We will arrange for wound check while in the hospital  5. Persistent atrial fibrillation and Atypical atrial flutter  - He has been in AFib since 05/06/15. Avg HR 100-110.   - Cont Xarelto 15 mg po daily (new package insert states that lower dose of xarelto 15 mg po daily safe in patient's with GFR > 15) .H/o intolerance to amio due to diarrhea   - continue diltiazem and metoprolol.  6. Chronic kidney disease (CKD), stage III (moderate)   - crea today pending  7. Hypertension - hold lisinopril and amlodipine (interaction with cardizem and LE edema), increase cardizem to 360 CD PO daily and continue metoprolol 25 mg po BID, we will adjust at the next week's appointment if necessary.       Dorothy Spark 05/12/2015

## 2015-05-18 ENCOUNTER — Other Ambulatory Visit (INDEPENDENT_AMBULATORY_CARE_PROVIDER_SITE_OTHER): Payer: Medicare Other | Admitting: *Deleted

## 2015-05-18 DIAGNOSIS — N183 Chronic kidney disease, stage 3 (moderate): Secondary | ICD-10-CM | POA: Diagnosis not present

## 2015-05-18 LAB — BASIC METABOLIC PANEL
BUN: 58 mg/dL — ABNORMAL HIGH (ref 6–23)
CALCIUM: 9 mg/dL (ref 8.4–10.5)
CO2: 21 mEq/L (ref 19–32)
Chloride: 104 mEq/L (ref 96–112)
Creatinine, Ser: 2.83 mg/dL — ABNORMAL HIGH (ref 0.40–1.50)
GFR: 23.28 mL/min — ABNORMAL LOW (ref >60.00)
Glucose, Bld: 108 mg/dL — ABNORMAL HIGH (ref 70–99)
POTASSIUM: 4.5 meq/L (ref 3.5–5.1)
SODIUM: 133 meq/L — AB (ref 135–145)

## 2015-05-19 NOTE — Progress Notes (Signed)
Cardiology Office Note   Date:  05/20/2015   ID:  [name] Fiddler, MD, DOB Dec 13, 1941, MRN [id number]  PCP:  [name], MD  Cardiologist:  Dr. [name]   Electrophysiologist:  Dr. Thompson Grayer   Chief Complaint  Patient presents with  . 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 He is on long-term anticoagulation with Xarelto. He has had multiple cardioversions in the past. His last cardioversion was in January 2015. 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 Echocardiogram in June 2015 demonstrated normal LV function, moderate aortic stenosis.  Repeat echocardiogram in 11/2014 demonstrated normal LV function, severe LVH, bioprosthetic AVR with mean gradient of 29 and moderate LAE. He had been admitted in March 2016 after a mechanical fall resulting in fractured seventh rib on the right. He returned to the hospital with acute renal failure and idioventricular rhythm with heart rate as low as 24. Hospitalization was complicated by Mallory-Weiss tear. He improved with hydration. He was recently seen by Dr. Mare Ferrari 04/26/15. He was back in atrial flutter with slow ventricular response. He was referred to EP.  He was seen by Dr. Rayann Heman 05/04/15.  The patient was noted to have symptomatic persistent atrial fibrillation and atypical atrial flutter with tachycardia with minimal exertion. Options included pacemaker implantation with medical rate control versus ablation. It was ultimately decided to proceed with pacemaker implantation. If this was unsuccessful, consideration could be given towards AV nodal ablation in the future. He underwent implantation of  a dual-chamber [employer]. Jude pacemaker on 05/05/15.   He was seen urgently in the office 5/17 with evidence of a/c diastolic HF.  He was admitted for 48 hours and diuresed with Lasix.  Troponins were minimally elevated.  Creatinine improved slightly.  Echo demonstrated normal LVF with stable gradients across his AVR.  Amlodipine was DC'd and Diltiazem was increased.  He returns for FU.    He is doing fairly well.  Breathing remains improved.  Denies orthopnea, PND.  LE edema is stable.  No angina.  No syncope.  He feels more palpitations prior to taking each dose of Metoprolol.  He would like to increase the dose and cut back on the Diltiazem.  He saw his nephrologist last week who decreased his Lasix to every other day.      Studies/Reports Reviewed Today:  VQ Scan 05/10/15 IMPRESSION:  Normal examination. No evidence of pulmonary embolism.  Echo 05/11/15 - Left ventricle: The cavity size was normal. Wall thickness was increased in a pattern of mild LVH. EF  55% to 60%. Wall motion was normal; - Aortic valve: A bioprosthesis was present. Valve area (VTI): 0.82cm^2. Valve area (Vmax): 0.82 cm^2. Valve area (Vmean): 0.93  Cm^2.  Mean gradient (S): 27 mm Hg. Peak gradient (S): 58 mm Hg. - Mitral valve: Calcified annulus. There was mild regurgitation. - Left atrium: The atrium was moderately dilated. - Right atrium: The atrium was mildly dilated. - Tricuspid valve: There was moderate regurgitation.  Pacer Implant 05/05/15 CONCLUSIONS:  1. 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 No early apparent complications.   Echo 12/13/14 - Severe  LVH. EF 55% to 60%.  - Aortic valve: Tissue AVR with no perivalvular leak.  Leaflets appear calcified but not well seen Increased systolic gradients. Mean gradient 29 mmHg compared to 36 mmHg in June and peak gradients also similar 24mmHg compared to 53  mmHg in June Consider TEE if clinically indicated - Mitral valve: Calcified annulus. - Left atrium: The atrium was moderately dilated. - Atrial septum: No defect or patent foramen ovale was identified.  Carotid US 06/07/14 Bilateral ICA 1-39%  LHC 05/30/11 LM: Clot prior to takeoff of LAD and circumflex LAD: Occluded LCx: Proximal 70% RCA: Not injected; known to be occluded SVG-DX: Patent SVG-OM1/OM2:  Patent SVG-PDA: Patent L-LAD:  Patent    Past Medical History  Diagnosis Date  . Coronary heart disease     stent, CABG, RBBB  . Valvular heart disease     aortic stenosis/regurgitation  . Sarcoid 2011    pulmonary and bone marrow  . Hypercalcemia 2011    due to sarcoidiosis  . Bone marrow disease   . Diabetes mellitus   . Hemorrhagic cystitis 2012  . Gout   . HTN (hypertension)   . A-fib     permanent with tachy-brady syndrome  . Myocardial infarction     1995, 2002, 2006  . OSA (obstructive sleep apnea)     use bipap setting of 10 and 12  . Complication of anesthesia 11-16-13    required alot of versed per anesthesia with cataract surgery  . Chronic kidney disease (CKD), stage III (moderate)     lov note dr Koleen Nimrod nephrology 09-17-13 on chart  . Atrial flutter     hx of  . CHF (congestive heart failure)   . Pneumonia 1966, 2011    hx of  . Anxiety   . Osteoarthritis   . DDD (degenerative disc disease)   . Synovial cyst of lumbar spine     l3-l4, l4-l5, injections  . Anemia   . Hepatitis     mono  . Cancer     basal cell  . Right sciatic nerve pain     for block thursday 01-21-2014, injections  . Fungal toenail infection   . Biceps tendon tear     right  . Diabetes   . Shortness of breath dyspnea   . Presence of permanent cardiac pacemaker     Past Surgical History  Procedure Laterality Date  . Cataract surgery Right 2007  . Prostate surgery  oct. 2012    TURP  . Vasectomy  1977  . Redo median sternotomy, extracorporeal cirulation, avr,  tricuspid valve repair  06/13/2011    AVR(23-mm Edwards pericardial Magna-Ease valve./ TVrepair (34-mm Edwards MC3 annuloplasty ring  . Coronary artery bypass graft  2006    x 5  . Cataract surgery Left 11-16-13  . Cardioversion N/A 12/30/2013    Procedure: CARDIOVERSION;  Surgeon: [name], MD;  Location: [facility] ENDOSCOPY;  Service: Cardiovascular;  Laterality: N/A;  10:49 cardioversion at 120 joules, then 150 joules, to SB  used  Lido 30mg ,  Propofol 160 mcg  . Colonoscopy N/A 01/29/2014    Procedure: COLONOSCOPY;  Surgeon: Winfield Cunas., MD;  Location: [facility] ENDOSCOPY;  Service: Endoscopy;  Laterality: N/A;  amanda//ja  . Cardioversion  2003, 2006, 2012, 2013  . Portacath placement    . Portacath removed    . Basal cell carcinoma excision Right 2015    ear  . Total knee arthroplasty Right 05/24/2014    Procedure: RIGHT TOTAL  KNEE ARTHROPLASTY;  Surgeon: Gearlean Alf, MD;  Location: [facility] ORS;  Service: Orthopedics;  Laterality: Right;  . Ep implantable device N/A 05/05/2015    Procedure: Pacemaker Implant;  Surgeon: Thompson Grayer, MD;  Location: [facility];  Service: Cardiovascular;  Laterality: N/A;  . Insert / replace / remove pacemaker  05/05/2015     Current Outpatient Prescriptions  Medication Sig Dispense Refill  . acetaminophen (TYLENOL) 500 MG tablet Take 1,000 mg by mouth 2 (two) times daily as needed (pain).     [name] allopurinol (ZYLOPRIM) 100 MG tablet Take 200 mg by mouth daily with breakfast.     . COCONUT OIL PO Apply 1 application topically daily as needed (dry skin).    [name] diazepam (VALIUM) 10 MG tablet Take 1 tablet by mouth daily as needed for anxiety or sleep. Ankle and foot cramps  0  . diltiazem (CARDIZEM CD) 240 MG 24 hr capsule Take 1 capsule (240 mg total) by mouth daily. 30 capsule 11  . diphenoxylate-atropine (LOMOTIL) 2.5-0.025 MG per tablet Take 1-2 tablets by mouth daily as needed. stomach  1  . furosemide (LASIX) 40 MG tablet Take 1 tablet (40 mg  total) by mouth every other day.    . insulin glargine (LANTUS) 100 unit/mL SOPN Inject 2-3 Units into the skin daily after breakfast. CBG 120-140 2 units, 140-160 3 units    . Magnesium 250 MG TABS Take 125 mg by mouth at bedtime.    . metoprolol tartrate (LOPRESSOR) 50 MG tablet Take 1 tablet (50 mg total) by mouth 2 (two) times daily. 60 tablet 11  . ONETOUCH VERIO test strip 1 each by Other route 2 (two) times daily. use for testing  6  . Rivaroxaban (XARELTO) 15 MG TABS tablet Take 15 mg by mouth daily with supper.     . terazosin (HYTRIN) 10 MG capsule Take 10 mg by mouth daily with breakfast.    . TERBINAFINE EX Apply 1 drop topically at bedtime. Place 1 drop on each toenail.    [name] testosterone cypionate (DEPOTESTOTERONE CYPIONATE) 200 MG/ML injection Inject 82ml intramuscularly as directed twice a month (Patient taking differently: Inject 200 mg into the muscle every 14 (fourteen) days. ) 10 mL 0  . traMADol (ULTRAM) 50 MG tablet Take 50-100 mg by mouth every 4 (four) hours as needed (pain).   0  . zolpidem (AMBIEN) 10 MG tablet Take 10 mg by mouth at bedtime as needed for sleep.      No current facility-administered medications for this visit.    Allergies:   Actos; Hydralazine; Isosorbide dinitrate; Penicillins; Amiodarone; Latex; Codeine; Depakote; Januvia; Loop diuretics; Losartan; Nsaids; Onglyza; Orudis; Pentazocine lactate; Rozerem; Sertraline hcl; Benazepril hcl; Indomethacin; Minocycline; Poison ivy extract; Potassium iodide; and Shellfish allergy    Social History:  The patient  reports that he quit smoking about 19 years ago. His smoking use included Pipe and Cigars. He has never used smokeless tobacco. He reports that he drinks alcohol. He reports that he does not use illicit drugs.   Family History:  The patient's family history includes Allergies in an other family member; Asthma in an other family member; Cancer in an other family member; Heart attack in his father; Heart  disease in an other family member; Stroke in his father and mother.    ROS:   Please see the history of present illness.   Review of Systems  Cardiovascular: Positive for irregular heartbeat.  All other systems reviewed and are  negative.    PHYSICAL EXAM: VS:  BP 150/62 mmHg  Pulse 60  Ht 5\' 11"  (1.803 m)  Wt 239 lb (108.41 kg)  BMI 33.35 kg/m2    Wt Readings from Last 3 Encounters:  05/20/15 239 lb (108.41 kg)  05/12/15 234 lb 6.4 oz (106.323 kg)  05/10/15 247 lb 6.4 oz (112.22 kg)     GEN: Well nourished, well developed, in no acute distress HEENT: normal Neck: no JVD,   no masses Cardiac:  Normal S1/S2, RRR; harsh 2/6 systolic murmur at the RUSB,  no rubs or gallops, trace-1+ bilateral LE edema  Respiratory:  clear to auscultation bilaterally, no wheezing, rhonchi or rales. GI: soft, nontender, nondistended, + BS MS: no deformity or atrophy Skin: warm and dry  Neuro:  CNs II-XII intact, Strength and sensation are intact Psych: Normal affect   EKG:  EKG is ordered today.  It demonstrates:  V paced, HR 60   Recent Labs: 05/10/2015: ALT 9*; B Natriuretic Peptide 510.1*; Hemoglobin 12.4*; Platelets 145* 05/18/2015: BUN 58*; Creatinine 2.83*; Potassium 4.5; Sodium 133*    Lipid Panel    Component Value Date/Time   CHOL 106 05/28/2014 0140   TRIG 169* 05/28/2014 0140   HDL 14* 05/28/2014 0140   CHOLHDL 7.6 05/28/2014 0140   VLDL 34 05/28/2014 0140   LDLCALC 58 05/28/2014 0140      ASSESSMENT AND PLAN:  Chronic diastolic CHF (congestive heart failure):  Volume stable.  Continue current dose of Lasix.  Coronary artery disease involving native coronary artery of native heart without angina pectoris:  Patient had minimally elevated Troponins with flat trend.  Likely demand ischemia in setting of acute HF.  EF remained normal with no RWMA on echo.  He denies angina.  Continue beta blocker.  He is not on ASA as he is on Xarelto.     VALVULAR HEART DISEASE s/p  Bioprosthetic AVR and TV Repair:  Stable AVR on recent echo and mod TR.  Continue SBE prophylaxis.     Sick sinus syndrome S/P placement of cardiac pacemaker:  FU with EP as planned.   Persistent atrial fibrillation and Atypical atrial flutter:  He has noted some increased HR with activity.  I will increase Metoprolol to 50 mg Twice daily.  He can reduce his Diltiazem to 240 mg QD.  If he has increasing BP or worsening HR, he should resume Diltiazem 360 mg QD.  He remains on Xarelto 15 mg QD.    Chronic kidney disease (CKD), stage III (moderate):  Recent Creatinine stable.  Will get repeat BMET at next visit.   Medications were reviewed today.  Any concerns are as listed above.  Changes made today include:  Increase Metoprolol to 50 mg Twice daily   Decrease Diltiazem 240 mg Once daily    Labs/ tests ordered today include:  Orders Placed This Encounter  Procedures  . Basic Metabolic Panel (BMET)  . EKG 12-Lead    Disposition:   FU Dr. [name] 2 weeks as planned.   Signed, Versie Starks, MHS 05/20/2015 1:13 PM    [facility] [address], [place], [place]  [address] Phone: [phone]; Fax: [phone]

## 2015-05-20 ENCOUNTER — Encounter: Payer: Self-pay | Admitting: Physician Assistant

## 2015-05-20 ENCOUNTER — Ambulatory Visit (INDEPENDENT_AMBULATORY_CARE_PROVIDER_SITE_OTHER): Payer: Medicare Other | Admitting: Physician Assistant

## 2015-05-20 ENCOUNTER — Other Ambulatory Visit: Payer: Self-pay | Admitting: Internal Medicine

## 2015-05-20 VITALS — BP 150/62 | HR 60 | Ht 71.0 in | Wt 239.0 lb

## 2015-05-20 DIAGNOSIS — N183 Chronic kidney disease, stage 3 unspecified: Secondary | ICD-10-CM

## 2015-05-20 DIAGNOSIS — I259 Chronic ischemic heart disease, unspecified: Secondary | ICD-10-CM

## 2015-05-20 DIAGNOSIS — I4819 Other persistent atrial fibrillation: Secondary | ICD-10-CM

## 2015-05-20 DIAGNOSIS — Z954 Presence of other heart-valve replacement: Secondary | ICD-10-CM

## 2015-05-20 DIAGNOSIS — I38 Endocarditis, valve unspecified: Secondary | ICD-10-CM | POA: Diagnosis not present

## 2015-05-20 DIAGNOSIS — I5032 Chronic diastolic (congestive) heart failure: Secondary | ICD-10-CM

## 2015-05-20 DIAGNOSIS — I481 Persistent atrial fibrillation: Secondary | ICD-10-CM

## 2015-05-20 DIAGNOSIS — Z950 Presence of cardiac pacemaker: Secondary | ICD-10-CM

## 2015-05-20 DIAGNOSIS — Z952 Presence of prosthetic heart valve: Secondary | ICD-10-CM

## 2015-05-20 LAB — CUP PACEART MISC DEVICE CHECK
Date Time Interrogation Session: 20160527101949
Pulse Gen Serial Number: 7759894

## 2015-05-20 MED ORDER — METOPROLOL TARTRATE 50 MG PO TABS: 50.0000 mg | ORAL_TABLET | Freq: Two times a day (BID) | ORAL | Status: DC

## 2015-05-20 MED ORDER — DILTIAZEM HCL ER COATED BEADS 240 MG PO CP24: 240.0000 mg | ORAL_CAPSULE | Freq: Every day | ORAL | Status: DC

## 2015-05-20 MED ORDER — FUROSEMIDE 40 MG PO TABS: 40.0000 mg | ORAL_TABLET | ORAL | Status: DC

## 2015-05-20 NOTE — Patient Instructions (Signed)
Medication Instructions:  1. DECREASE DILTIAZEM TO 240 MG DAILY; NEW RX SENT IN   2. INCREASE METOPROLOL TO 50 MG TWICE DAILY; NEW RX SENT IN  Labwork: 06/01/15 BMET TO BE DONE WHEN YOU SEE DR. BRACKBILL  Testing/Procedures: NONE  Follow-Up: KEEP YOUR FOLLOW UP WITH DR. Mare Ferrari 06/01/15  Any Other Special Instructions Will Be Listed Below (If Applicable).

## 2015-05-24 ENCOUNTER — Encounter: Payer: Self-pay | Admitting: Internal Medicine

## 2015-05-30 ENCOUNTER — Encounter: Payer: Self-pay | Admitting: Cardiology

## 2015-06-01 ENCOUNTER — Encounter: Payer: Self-pay | Admitting: Cardiology

## 2015-06-01 ENCOUNTER — Ambulatory Visit (INDEPENDENT_AMBULATORY_CARE_PROVIDER_SITE_OTHER): Payer: Medicare Other | Admitting: Cardiology

## 2015-06-01 VITALS — BP 120/70 | HR 76 | Ht 71.0 in | Wt 237.0 lb

## 2015-06-01 DIAGNOSIS — I5032 Chronic diastolic (congestive) heart failure: Secondary | ICD-10-CM

## 2015-06-01 DIAGNOSIS — N183 Chronic kidney disease, stage 3 unspecified: Secondary | ICD-10-CM

## 2015-06-01 DIAGNOSIS — I481 Persistent atrial fibrillation: Secondary | ICD-10-CM | POA: Diagnosis not present

## 2015-06-01 DIAGNOSIS — I259 Chronic ischemic heart disease, unspecified: Secondary | ICD-10-CM | POA: Diagnosis not present

## 2015-06-01 DIAGNOSIS — Z954 Presence of other heart-valve replacement: Secondary | ICD-10-CM

## 2015-06-01 DIAGNOSIS — Z952 Presence of prosthetic heart valve: Secondary | ICD-10-CM

## 2015-06-01 DIAGNOSIS — I4819 Other persistent atrial fibrillation: Secondary | ICD-10-CM

## 2015-06-01 NOTE — Patient Instructions (Signed)
Medication Instructions:  Your physician recommends that you continue on your current medications as directed. Please refer to the Current Medication list given to you today.  Labwork: none  Testing/Procedures: none  Follow-Up: Your physician recommends that you schedule a follow-up appointment in: 2 month ov/ekg

## 2015-06-01 NOTE — Progress Notes (Signed)
Cardiology Office Note   Date:  06/01/2015   ID:  [name], MD, DOB [birth year], MRN [id number]  PCP:  [name], MD  Cardiologist: [name] MD  No chief complaint on file.     History of Present Illness: [name], MD is a 74 y.o. male who presents for a scheduled follow-up office visit.  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 He is on long-term anticoagulation with Xarelto. He has had multiple cardioversions in the past. His last cardioversion was in January 2015. 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 Echocardiogram in June 2015 demonstrated normal LV function, moderate aortic stenosis. Repeat echocardiogram in 11/2014 demonstrated normal LV function, severe LVH, bioprosthetic AVR with mean gradient of 29 and moderate LAE. He had been admitted in March 2016 after a mechanical fall resulting in fractured seventh rib on the right. He returned to the hospital with acute renal failure and idioventricular rhythm with heart rate as low as 24. Hospitalization was complicated by Mallory-Weiss tear. He improved with hydration. He was recently seen by Dr. Mare Ferrari 04/26/15. He was back in atrial flutter with slow ventricular response. He was referred to EP.  He was seen by Dr. Rayann Heman 05/04/15. The patient was noted to have symptomatic persistent atrial fibrillation and atypical atrial flutter with tachycardia with minimal exertion. Options included pacemaker implantation with medical rate control versus ablation. It was ultimately decided to proceed with pacemaker implantation. If this was unsuccessful, consideration could be given towards AV nodal ablation in the future. He underwent  implantation of a dual-chamber [employer]. Jude pacemaker on 05/05/15.   He was seen urgently in the office 5/17 with evidence of a/c diastolic HF. He was admitted for 48 hours and diuresed with Lasix. Troponins were minimally elevated. Creatinine improved slightly. Echo demonstrated normal LVF with stable gradients across his AVR. Amlodipine was DC'd and Diltiazem was increased.However, he subsequently began having marked fatigue which he attributed to the diltiazem.  The diltiazem was reduced and subsequently stopped altogether and he is felt much better.  His rate control is now dependent upon metoprolol tartrate 50 mg twice a day Since last visit he has felt better.  His dyspnea is improved.  He is not having any peripheral edema.  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  Past Medical History  Diagnosis Date  . Coronary heart disease     stent, CABG, RBBB  . Valvular heart disease     aortic stenosis/regurgitation  . Sarcoid 2011    pulmonary and bone marrow  . Hypercalcemia 2011    due to sarcoidiosis  . Bone marrow disease   . Diabetes mellitus   . Hemorrhagic cystitis 2012  . Gout   . HTN (hypertension)   . A-fib     permanent with tachy-brady syndrome  . Myocardial infarction     1995, 2002, 2006  . OSA (obstructive sleep apnea)     use bipap setting of 10 and 12  . Complication of anesthesia 11-16-13    required alot of versed per anesthesia with cataract surgery  . Chronic kidney disease (CKD), stage III (moderate)     lov note dr Koleen Nimrod nephrology 09-17-13 on chart  .  Atrial flutter     hx of  . CHF (congestive heart failure)   . Pneumonia 1966, 2011    hx of  . Anxiety   . Osteoarthritis   . DDD (degenerative disc disease)   . Synovial cyst of lumbar spine     l3-l4, l4-l5, injections  . Anemia   . Hepatitis     mono  . Cancer     basal cell  . Right sciatic nerve pain     for block thursday 01-21-2014, injections  . Fungal toenail  infection   . Biceps tendon tear     right  . Diabetes   . Shortness of breath dyspnea   . Presence of permanent cardiac pacemaker     Past Surgical History  Procedure Laterality Date  . Cataract surgery Right 2007  . Prostate surgery  oct. 2012    TURP  . Vasectomy  1977  . Redo median sternotomy, extracorporeal cirulation, avr, tricuspid valve repair  06/13/2011    AVR(23-mm Edwards pericardial Magna-Ease valve./ TVrepair (34-mm Edwards MC3 annuloplasty ring  . Coronary artery bypass graft  2006    x 5  . Cataract surgery Left 11-16-13  . Cardioversion N/A 12/30/2013    Procedure: CARDIOVERSION;  Surgeon: [name], MD;  Location: [facility] ENDOSCOPY;  Service: Cardiovascular;  Laterality: N/A;  10:49 cardioversion at 120 joules, then 150 joules, to SB  used  Lido 30mg ,  Propofol 160 mcg  . Colonoscopy N/A 01/29/2014    Procedure: COLONOSCOPY;  Surgeon: Winfield Cunas., MD;  Location: [facility] ENDOSCOPY;  Service: Endoscopy;  Laterality: N/A;  amanda//ja  . Cardioversion  2003, 2006, 2012, 2013  . Portacath placement    . Portacath removed    . Basal cell carcinoma excision Right 2015    ear  . Total knee arthroplasty Right 05/24/2014    Procedure: RIGHT TOTAL KNEE ARTHROPLASTY;  Surgeon: Gearlean Alf, MD;  Location: [facility] ORS;  Service: Orthopedics;  Laterality: Right;  . Ep implantable device N/A 05/05/2015    Procedure: Pacemaker Implant;  Surgeon: Thompson Grayer, MD;  Location: [facility];  Service: Cardiovascular;  Laterality: N/A;  . Insert / replace / remove pacemaker  05/05/2015     Current Outpatient Prescriptions  Medication Sig Dispense Refill  . acetaminophen (TYLENOL) 500 MG tablet Take 1,000 mg by mouth 2 (two) times daily as needed (pain).     [name] allopurinol (ZYLOPRIM) 100 MG tablet Take 200 mg by mouth daily with breakfast.     . COCONUT OIL PO Apply 1 application topically daily as needed (dry skin).    [name] diazepam (VALIUM) 10 MG tablet Take 1 tablet by mouth  daily as needed for anxiety or sleep. Ankle and foot cramps  0  . diphenoxylate-atropine (LOMOTIL) 2.5-0.025 MG per tablet Take 1-2 tablets by mouth daily as needed. stomach  1  . furosemide (LASIX) 40 MG tablet Take 1 tablet (40 mg total) by mouth every other day.    . insulin glargine (LANTUS) 100 unit/mL SOPN Inject 2-3 Units into the skin daily after breakfast. CBG 120-140 2 units, 140-160 3 units    . Magnesium 250 MG TABS Take 125 mg by mouth at bedtime.    . metoprolol tartrate (LOPRESSOR) 50 MG tablet Take 1 tablet (50 mg total) by mouth 2 (two) times daily. 60 tablet 11  . ONETOUCH VERIO test strip 1 each by Other route 2 (two) times daily. use for testing  6  .  Rivaroxaban (XARELTO) 15 MG TABS tablet Take 15 mg by mouth daily with supper.     . terazosin (HYTRIN) 10 MG capsule Take 10 mg by mouth daily with breakfast.    . TERBINAFINE EX Apply 1 drop topically at bedtime. Place 1 drop on each toenail.    [name] testosterone cypionate (DEPOTESTOTERONE CYPIONATE) 200 MG/ML injection Inject 32ml intramuscularly as directed twice a month (Patient taking differently: Inject 200 mg into the muscle every 14 (fourteen) days. ) 10 mL 0  . traMADol (ULTRAM) 50 MG tablet Take 50-100 mg by mouth every 4 (four) hours as needed (pain).   0  . zolpidem (AMBIEN) 10 MG tablet Take 10 mg by mouth at bedtime as needed for sleep.      No current facility-administered medications for this visit.    Allergies:   Actos; Hydralazine; Isosorbide dinitrate; Penicillins; Amiodarone; Latex; Codeine; Depakote; Diltiazem; Januvia; Loop diuretics; Losartan; Nsaids; Onglyza; Orudis; Pentazocine lactate; Rozerem; Sertraline hcl; Benazepril hcl; Indomethacin; Minocycline; Poison ivy extract; Potassium iodide; and Shellfish allergy    Social History:  The patient  reports that he quit smoking about 19 years ago. His smoking use included Pipe and Cigars. He has never used smokeless tobacco. He reports that he drinks alcohol. He  reports that he does not use illicit drugs.   Family History:  The patient's family history includes Allergies in an other family member; Asthma in an other family member; Cancer in an other family member; Heart attack in his father; Heart disease in an other family member; Stroke in his father and mother.    ROS:  Please see the history of present illness.   Otherwise, review of systems are positive for none.   All other systems are reviewed and negative.    PHYSICAL EXAM: VS:  BP 120/70 mmHg  Pulse 76  Ht 5\' 11"  (1.803 m)  Wt 237 lb (107.502 kg)  BMI 33.07 kg/m2 , BMI Body mass index is 33.07 kg/(m^2). GEN: Well nourished, well developed, in no acute distress HEENT: normal Neck: no JVD, carotid bruits, or masses Cardiac: Irregularly irregular rhythm.  Grade 2/6 systolic ejection murmur across the prosthetic aortic valve.  No aortic insufficiency.  No peripheral edema. Respiratory:  clear to auscultation bilaterally, normal work of breathing GI: soft, nontender, nondistended, + BS MS: no deformity or atrophy Skin: warm and dry, no rash Neuro:  Strength and sensation are intact Psych: euthymic mood, full affect   EKG:  EKG is ordered today. The ekg ordered today demonstrates atrial fibrillation with controlled ventricular response.  Occasional paced beats.  Underlying QRS is right bundle branch block with left axis deviation.   Recent Labs: 05/10/2015: ALT 9*; B Natriuretic Peptide 510.1*; Hemoglobin 12.4*; Platelets 145* 05/18/2015: BUN 58*; Creatinine, Ser 2.83*; Potassium 4.5; Sodium 133*    Lipid Panel    Component Value Date/Time   CHOL 106 05/28/2014 0140   TRIG 169* 05/28/2014 0140   HDL 14* 05/28/2014 0140   CHOLHDL 7.6 05/28/2014 0140   VLDL 34 05/28/2014 0140   LDLCALC 58 05/28/2014 0140      Wt Readings from Last 3 Encounters:  06/01/15 237 lb (107.502 kg)  05/20/15 239 lb (108.41 kg)  05/12/15 234 lb 6.4 oz (106.323 kg)       ASSESSMENT AND  PLAN:  Chronic diastolic CHF (congestive heart failure): Volume stable. Continue current dose of Lasix.  Coronary artery disease involving native coronary artery of native heart without angina pectoris:Continue beta blocker.  The patient  is not expressing any angina pectoris.  Continue Xarelto.  He is no longer on aspirin and his bruising has improved significantly.  VALVULAR HEART DISEASE s/p Bioprosthetic AVR and TV Repair: Stable AVR on recent echo and mod TR. Continue SBE prophylaxis.   Sick sinus syndrome S/P placement of cardiac pacemaker: FU with EP as planned.   Persistent atrial fibrillation and Atypical atrial flutter: He did not tolerate diltiazem.  He is tolerating Lopressor.  Continue current regimen. He remains on Xarelto 15 mg QD.   Chronic kidney disease (CKD), stage III (moderate): Followed by nephrology.   Medications were reviewed today. Any concerns are as listed above. Changes made today include:  Increase Metoprolol to 50 mg Twice daily   Decrease Diltiazem 240 mg Once daily   Current medicines are reviewed at length with the patient today.  The patient does not have concerns regarding medicines.  The following changes have been made:  no change  Labs/ tests ordered today include:   Orders Placed This Encounter  Procedures  . EKG 12-Lead   Disposition: Presently he is euvolemic and looks good.  Weight is down.  Heart rate is adequately controlled on beta blocker alone.  Continue current medication.  Recheck in 2 months for office visit and EKG  Signed, [name] MD 06/01/2015 4:47 PM    [address], [place], [place]  [address] Phone: [phone]; Fax: [phone]

## 2015-07-10 ENCOUNTER — Other Ambulatory Visit: Payer: Self-pay | Admitting: Cardiology

## 2015-08-08 ENCOUNTER — Encounter: Payer: Self-pay | Admitting: Internal Medicine

## 2015-08-08 ENCOUNTER — Ambulatory Visit (INDEPENDENT_AMBULATORY_CARE_PROVIDER_SITE_OTHER): Payer: Medicare Other | Admitting: Internal Medicine

## 2015-08-08 VITALS — BP 120/72 | HR 60 | Ht 71.0 in | Wt 246.0 lb

## 2015-08-08 DIAGNOSIS — I495 Sick sinus syndrome: Secondary | ICD-10-CM | POA: Diagnosis not present

## 2015-08-08 DIAGNOSIS — I481 Persistent atrial fibrillation: Secondary | ICD-10-CM | POA: Diagnosis not present

## 2015-08-08 DIAGNOSIS — I259 Chronic ischemic heart disease, unspecified: Secondary | ICD-10-CM

## 2015-08-08 DIAGNOSIS — R001 Bradycardia, unspecified: Secondary | ICD-10-CM

## 2015-08-08 DIAGNOSIS — Z959 Presence of cardiac and vascular implant and graft, unspecified: Secondary | ICD-10-CM

## 2015-08-08 DIAGNOSIS — I4819 Other persistent atrial fibrillation: Secondary | ICD-10-CM

## 2015-08-08 LAB — CUP PACEART INCLINIC DEVICE CHECK
Battery Remaining Longevity: 123.6 mo
Battery Voltage: 3.01 V
Brady Statistic RA Percent Paced: 0 %
Lead Channel Impedance Value: 440 Ohm
Lead Channel Impedance Value: 650 Ohm
Lead Channel Pacing Threshold Amplitude: 1 V
Lead Channel Pacing Threshold Pulse Width: 0.5 ms
Lead Channel Sensing Intrinsic Amplitude: 3.1 mV
Lead Channel Sensing Intrinsic Amplitude: 7.7 mV
Lead Channel Setting Pacing Amplitude: 2.5 V
Lead Channel Setting Pacing Pulse Width: 0.5 ms
MDC IDC SESS DTM: 20160815151632
MDC IDC SET LEADCHNL RV SENSING SENSITIVITY: 2 mV
MDC IDC STAT BRADY RV PERCENT PACED: 66 %
Pulse Gen Serial Number: 7759894

## 2015-08-08 NOTE — Patient Instructions (Addendum)
Medication Instructions:  Your physician recommends that you continue on your current medications as directed. Please refer to the Current Medication list given to you today.   Labwork: None ordered  Testing/Procedures: None ordered  Follow-Up: Your physician recommends that you schedule a follow-up appointment in: 3 months with Dr Mare Ferrari.  Cancel 08/19/15 and move out 3 months     Your physician wants you to follow-up in: 12 months with Chanetta Marshall, NP You will receive a reminder letter in the mail two months in advance. If you don't receive a letter, please call our office to schedule the follow-up appointment.  Remote monitoring is used to monitor your Pacemaker  from home. This monitoring reduces the number of office visits required to check your device to one time per year. It allows [place] to keep an eye on the functioning of your device to ensure it is working properly. You are scheduled for a device check from home on 11/07/15. You may send your transmission at any time that day. If you have a wireless device, the transmission will be sent automatically. After your physician reviews your transmission, you will receive a postcard with your next transmission date. ]  Any Other Special Instructions Will Be Listed Below (If Applicable).

## 2015-08-08 NOTE — Progress Notes (Signed)
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  Since his PPM implant, the patient reports doing very well.  His exercise tolerance is much improved.  Today, he denies symptoms of palpitations, chest pain, shortness of breath,  lower extremity edema, dizziness, presyncope, or syncope.  The patient is otherwise without complaint today.   Past Medical History  Diagnosis Date  . Coronary heart disease     stent, CABG, RBBB  . Valvular heart disease     aortic stenosis/regurgitation  . Sarcoid 2011    pulmonary and bone marrow  . Hypercalcemia 2011    due to sarcoidiosis  . Bone marrow disease   . Diabetes mellitus   . Hemorrhagic cystitis 2012  . Gout   . HTN (hypertension)   . A-fib     permanent with tachy-brady syndrome  . Myocardial infarction     1995, 2002, 2006  . OSA (obstructive sleep apnea)     use bipap setting of 10 and 12  . Complication of anesthesia 11-16-13    required alot of versed per anesthesia with cataract surgery  . Chronic kidney disease (CKD), stage III (moderate)     lov note dr Koleen Nimrod nephrology 09-17-13 on chart  . Atrial flutter     hx of  . CHF (congestive heart failure)   . Pneumonia 1966, 2011    hx of  . Anxiety   . Osteoarthritis   . DDD (degenerative disc disease)   . Synovial cyst of lumbar spine     l3-l4, l4-l5, injections  . Anemia   . Hepatitis     mono  . Cancer     basal cell  . Right sciatic nerve pain     for block thursday 01-21-2014, injections  . Fungal toenail infection   . Biceps tendon tear     right  . Diabetes   . Shortness of breath dyspnea   . Presence of permanent cardiac pacemaker    Past Surgical History  Procedure Laterality Date  . Cataract surgery Right 2007  . Prostate surgery  oct. 2012    TURP  . Vasectomy  1977  . Redo median sternotomy, extracorporeal cirulation, avr, tricuspid  valve repair  06/13/2011    AVR(23-mm Edwards pericardial Magna-Ease valve./ TVrepair (34-mm Edwards MC3 annuloplasty ring  . Coronary artery bypass graft  2006    x 5  . Cataract surgery Left 11-16-13  . Cardioversion N/A 12/30/2013    Procedure: CARDIOVERSION;  Surgeon: [name], MD;  Location: [facility] ENDOSCOPY;  Service: Cardiovascular;  Laterality: N/A;  10:49 cardioversion at 120 joules, then 150 joules, to SB  used  Lido 79m,  Propofol 160 mcg  . Colonoscopy N/A 01/29/2014    Procedure: COLONOSCOPY;  Surgeon: JWinfield Cunas, MD;  [facility];  Service: Endoscopy;  Laterality: N/A;  amanda//ja  . Cardioversion  2003, 2006, 2012, 2013  . Portacath placement    . Portacath removed    . Basal cell carcinoma excision Right 2015    ear  . Total knee arthroplasty Right 05/24/2014    Procedure: RIGHT TOTAL KNEE ARTHROPLASTY;  Surgeon: FGearlean Alf MD;  Location: [facility];  Service: Orthopedics;  Laterality: Right;  . Ep implantable device N/A 05/05/2015    Procedure: Pacemaker Implant;  Surgeon: JThompson Grayer MD;  [facility];  Service: Cardiovascular;  Laterality:  N/A;  . Insert / replace / remove pacemaker  05/05/2015    ROS- all systems are reviewed and negative except as per HPI above  Current Outpatient Prescriptions  Medication Sig Dispense Refill  . acetaminophen (TYLENOL) 500 MG tablet Take 1,000 mg by mouth 2 (two) times daily as needed (pain).     [name] allopurinol (ZYLOPRIM) 100 MG tablet Take 200 mg by mouth daily with breakfast.     . COCONUT OIL PO Apply 1 application topically daily as needed (dry skin).    [name] diazepam (VALIUM) 10 MG tablet Take 1 tablet by mouth daily as needed for anxiety or sleep. Ankle and foot cramps  0  . diphenoxylate-atropine (LOMOTIL) 2.5-0.025 MG per tablet Take 1-2 tablets by mouth daily as needed. stomach  1  . furosemide (LASIX) 40 MG tablet Take 1 tablet (40 mg total) by mouth every other day.    . insulin glargine  (LANTUS) 100 unit/mL SOPN Inject 2-3 Units into the skin daily after breakfast. CBG 120-140 2 units, 140-160 3 units    . Magnesium 250 MG TABS Take 125 mg by mouth at bedtime.    . metoprolol (LOPRESSOR) 100 MG tablet Take 100 mg by mouth 2 (two) times daily.    Glory Rosebush VERIO test strip 1 each by Other route 2 (two) times daily. use for testing  6  . Rivaroxaban (XARELTO) 15 MG TABS tablet Take 15 mg by mouth daily with supper.     . terazosin (HYTRIN) 10 MG capsule Take 10 mg by mouth daily with breakfast.    . TERBINAFINE EX Apply 1 drop topically at bedtime. Place 1 drop on each toenail.    . Testosterone Cypionate 200 MG/ML KIT Inject 1 mL into the muscle every 14 (fourteen) days.    . traMADol (ULTRAM) 50 MG tablet Take 50-100 mg by mouth every 4 (four) hours as needed (pain).   0  . XARELTO 15 MG TABS tablet TAKE 1 TABLET BY MOUTH EVERY DAY 30 tablet 1  . zolpidem (AMBIEN) 10 MG tablet Take 10 mg by mouth once.     No current facility-administered medications for this visit.    Physical Exam: Filed Vitals:   08/08/15 1403  BP: 120/72  Pulse: 60  Height: _0  (1.803 m)  Weight: 111.585 kg (246 lb)    GEN- The patient is well appearing, alert and oriented x 3 today.   Head- normocephalic, atraumatic Eyes-  Sclera clear, conjunctiva pink Ears- hearing intact Oropharynx- clear Lungs- Clear to ausculation bilaterally, normal work of breathing Chest- pacemaker pocket is well healed Heart- Regular rate and rhythm (paced) GI- soft, NT, ND, + BS Extremities- no clubbing, cyanosis, or edema  Pacemaker interrogation- reviewed in detail today,  See PACEART report  Assessment and Plan:  1. Tachy/brady syndrome Normal pacemaker function See Pace Art report Reprogrammed to [facility] today  2. Permanent afib Well rate controlled Continue life long anticoagulation  Merlin Return to see Dr Mare Ferrari in 3 months Return to see EP NP in 1 year

## 2015-08-08 NOTE — Progress Notes (Deleted)
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  Since his recent ablation, the patient reports doing very well.  He has had no procedure related complications.  Denies further AF.  Today, he denies symptoms of palpitations, chest pain, shortness of breath,  lower extremity edema, dizziness, presyncope, or syncope.  The patient is otherwise without complaint today.   Past Medical History  Diagnosis Date  . Coronary heart disease     stent, CABG, RBBB  . Valvular heart disease     aortic stenosis/regurgitation  . Sarcoid 2011    pulmonary and bone marrow  . Hypercalcemia 2011    due to sarcoidiosis  . Bone marrow disease   . Diabetes mellitus   . Hemorrhagic cystitis 2012  . Gout   . HTN (hypertension)   . A-fib     permanent with tachy-brady syndrome  . Myocardial infarction     1995, 2002, 2006  . OSA (obstructive sleep apnea)     use bipap setting of 10 and 12  . Complication of anesthesia 11-16-13    required alot of versed per anesthesia with cataract surgery  . Chronic kidney disease (CKD), stage III (moderate)     lov note dr Koleen Nimrod nephrology 09-17-13 on chart  . Atrial flutter     hx of  . CHF (congestive heart failure)   . Pneumonia 1966, 2011    hx of  . Anxiety   . Osteoarthritis   . DDD (degenerative disc disease)   . Synovial cyst of lumbar spine     l3-l4, l4-l5, injections  . Anemia   . Hepatitis     mono  . Cancer     basal cell  . Right sciatic nerve pain     for block thursday 01-21-2014, injections  . Fungal toenail infection   . Biceps tendon tear     right  . Diabetes   . Shortness of breath dyspnea   . Presence of permanent cardiac pacemaker    Past Surgical History  Procedure Laterality Date  . Cataract surgery Right 2007  . Prostate surgery  oct. 2012    TURP  . Vasectomy  1977  . Redo median sternotomy, extracorporeal  cirulation, avr, tricuspid valve repair  06/13/2011    AVR(23-mm Edwards pericardial Magna-Ease valve./ TVrepair (34-mm Edwards MC3 annuloplasty ring  . Coronary artery bypass graft  2006    x 5  . Cataract surgery Left 11-16-13  . Cardioversion N/A 12/30/2013    Procedure: CARDIOVERSION;  Surgeon: [name], MD;  Location: [facility] ENDOSCOPY;  Service: Cardiovascular;  Laterality: N/A;  10:49 cardioversion at 120 joules, then 150 joules, to SB  used  Lido 11m,  Propofol 160 mcg  . Colonoscopy N/A 01/29/2014    Procedure: COLONOSCOPY;  Surgeon: JWinfield Cunas, MD;  [facility];  Service: Endoscopy;  Laterality: N/A;  amanda//ja  . Cardioversion  2003, 2006, 2012, 2013  . Portacath placement    . Portacath removed    . Basal cell carcinoma excision Right 2015    ear  . Total knee arthroplasty Right 05/24/2014    Procedure: RIGHT TOTAL KNEE ARTHROPLASTY;  Surgeon: FGearlean Alf MD;  Location: [facility];  Service: Orthopedics;  Laterality: Right;  . Ep implantable device N/A 05/05/2015    Procedure: Pacemaker Implant;  Surgeon: JThompson Grayer MD;  [facility];  Service: Cardiovascular;  Laterality: N/A;  . Insert / replace / remove pacemaker  05/05/2015    ROS- all systems are reviewed and negatives except as per HPI above  Current Outpatient Prescriptions  Medication Sig Dispense Refill  . acetaminophen (TYLENOL) 500 MG tablet Take 1,000 mg by mouth 2 (two) times daily as needed (pain).     [name] allopurinol (ZYLOPRIM) 100 MG tablet Take 200 mg by mouth daily with breakfast.     . COCONUT OIL PO Apply 1 application topically daily as needed (dry skin).    [name] diazepam (VALIUM) 10 MG tablet Take 1 tablet by mouth daily as needed for anxiety or sleep. Ankle and foot cramps  0  . diphenoxylate-atropine (LOMOTIL) 2.5-0.025 MG per tablet Take 1-2 tablets by mouth daily as needed. stomach  1  . furosemide (LASIX) 40 MG tablet Take 1 tablet (40 mg total) by mouth every other  day.    . insulin glargine (LANTUS) 100 unit/mL SOPN Inject 2-3 Units into the skin daily after breakfast. CBG 120-140 2 units, 140-160 3 units    . Magnesium 250 MG TABS Take 125 mg by mouth at bedtime.    . metoprolol (LOPRESSOR) 100 MG tablet Take 100 mg by mouth 2 (two) times daily.    Glory Rosebush VERIO test strip 1 each by Other route 2 (two) times daily. use for testing  6  . Rivaroxaban (XARELTO) 15 MG TABS tablet Take 15 mg by mouth daily with supper.     . terazosin (HYTRIN) 10 MG capsule Take 10 mg by mouth daily with breakfast.    . TERBINAFINE EX Apply 1 drop topically at bedtime. Place 1 drop on each toenail.    . Testosterone Cypionate 200 MG/ML KIT Inject 1 mL into the muscle every 14 (fourteen) days.    . traMADol (ULTRAM) 50 MG tablet Take 50-100 mg by mouth every 4 (four) hours as needed (pain).   0  . XARELTO 15 MG TABS tablet TAKE 1 TABLET BY MOUTH EVERY DAY 30 tablet 1  . zolpidem (AMBIEN) 10 MG tablet Take 10 mg by mouth once.     No current facility-administered medications for this visit.    Physical Exam: Filed Vitals:   08/08/15 1403  BP: 120/72  Pulse: 60  Height: _0  (1.803 m)  Weight: 111.585 kg (246 lb)    GEN- The patient is well appearing, alert and oriented x 3 today.   Head- normocephalic, atraumatic Eyes-  Sclera clear, conjunctiva pink Ears- hearing intact Oropharynx- clear Lungs- Clear to ausculation bilaterally, normal work of breathing Heart- Regular rate and rhythm, no murmurs, rubs or gallops, PMI not laterally displaced GI- soft, NT, ND, + BS Extremities- no clubbing, cyanosis, or edema  ekg today reveals sinus rhythm 70 bpm  Assessment and Plan:   1. Paroxysmal atrial fibrillation and atrial flutter Doing well s/p ablation Stop tikosyn No other changes  2. Obesity Weight loss encouraged  Return to see me in 3 months Follow-up with Dr Radford Pax as scheduled

## 2015-08-19 ENCOUNTER — Ambulatory Visit: Payer: Medicare Other | Admitting: Cardiology

## 2015-09-04 ENCOUNTER — Encounter: Payer: Self-pay | Admitting: Cardiology

## 2015-09-05 ENCOUNTER — Other Ambulatory Visit: Payer: Self-pay | Admitting: *Deleted

## 2015-09-05 ENCOUNTER — Encounter: Payer: Self-pay | Admitting: Cardiology

## 2015-09-05 ENCOUNTER — Ambulatory Visit
Admission: RE | Admit: 2015-09-05 | Discharge: 2015-09-05 | Disposition: A | Discharge status: Home / Self Care | Payer: Medicare Other | Source: Ambulatory Visit | Attending: Cardiology | Admitting: Cardiology

## 2015-09-05 ENCOUNTER — Telehealth: Payer: Self-pay | Admitting: *Deleted

## 2015-09-05 ENCOUNTER — Ambulatory Visit (INDEPENDENT_AMBULATORY_CARE_PROVIDER_SITE_OTHER): Payer: Medicare Other | Admitting: Cardiology

## 2015-09-05 VITALS — BP 172/80 | HR 87 | Ht 71.0 in | Wt 253.0 lb

## 2015-09-05 DIAGNOSIS — N183 Chronic kidney disease, stage 3 unspecified: Secondary | ICD-10-CM

## 2015-09-05 DIAGNOSIS — Z954 Presence of other heart-valve replacement: Secondary | ICD-10-CM | POA: Diagnosis not present

## 2015-09-05 DIAGNOSIS — I5032 Chronic diastolic (congestive) heart failure: Secondary | ICD-10-CM

## 2015-09-05 DIAGNOSIS — Z952 Presence of prosthetic heart valve: Secondary | ICD-10-CM

## 2015-09-05 DIAGNOSIS — I4819 Other persistent atrial fibrillation: Secondary | ICD-10-CM

## 2015-09-05 DIAGNOSIS — I259 Chronic ischemic heart disease, unspecified: Secondary | ICD-10-CM | POA: Diagnosis not present

## 2015-09-05 DIAGNOSIS — I481 Persistent atrial fibrillation: Secondary | ICD-10-CM

## 2015-09-05 MED ORDER — VERAPAMIL HCL ER 120 MG PO TBCR: 120.0000 mg | EXTENDED_RELEASE_TABLET | Freq: Two times a day (BID) | ORAL | Status: DC

## 2015-09-05 NOTE — Telephone Encounter (Signed)
Left message to call back     Please ask [name] to call me ASAP Monday. Tell her my wife thinks I need to see Dr Mare Ferrari this week.    [phone]        Thank you,    Dorian Heckle MD

## 2015-09-05 NOTE — Telephone Encounter (Signed)
Patient seeing  Dr. Brackbill today 

## 2015-09-05 NOTE — Patient Instructions (Addendum)
Medication Instructions:  START VERAPAMIL ER 120 TWICE A DAY   Labwork: NONE  Testing/Procedures: A chest x-ray takes a picture of the organs and structures inside the chest, including the heart, lungs, and blood vessels. This test can show several things, including, whether the heart is enlarges; whether fluid is building up in the lungs; and whether pacemaker / defibrillator leads are still in place.  [place] [facility] [place]: With Dr Rayann Heman soon   Keep your follow up with Dr Mare Ferrari as scheduled

## 2015-09-05 NOTE — Progress Notes (Signed)
Cardiology Office Note   Date:  09/05/2015   ID:  [name] Fiddler, MD, DOB 1941-09-10, MRN [id number]  PCP:  [name], MD  Cardiologist: [name] MD  No chief complaint on file.     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 He is on long-term anticoagulation with Xarelto. He has had multiple cardioversions in the past. His last cardioversion was in January 2015. 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 Echocardiogram in June 2015 demonstrated normal LV function, moderate aortic stenosis. Repeat echocardiogram in 11/2014 demonstrated normal LV function, severe LVH, bioprosthetic AVR with mean gradient of 29 and moderate LAE. He had been admitted in March 2016 after a mechanical fall resulting in fractured seventh rib on the right. He returned to the hospital with acute renal failure and idioventricular rhythm with heart rate as low as 24. Hospitalization was complicated by Mallory-Weiss tear. He improved with hydration. He was recently seen  04/26/15. He was back in atrial flutter with slow ventricular response. He was referred to EP.  He was seen by Dr. Rayann Heman 05/04/15. The patient was noted to have symptomatic persistent atrial fibrillation and atypical atrial flutter with tachycardia with minimal exertion. Options included pacemaker implantation with medical rate control versus ablation. It was ultimately decided to proceed with pacemaker implantation. If this was unsuccessful, consideration could be given towards AV nodal ablation in the future. He underwent implantation of a dual-chamber  [employer] Jude pacemaker on 05/05/15.   He was seen urgently in the office 05/10/15 with evidence of a/c diastolic HF. He was admitted for 48 hours and diuresed with Lasix. Troponins were minimally elevated. Creatinine improved slightly. Echo demonstrated normal LVF with stable gradients across his AVR. Amlodipine was DC'd and Diltiazem was increased.However, he subsequently began having marked fatigue which he attributed to the diltiazem. The diltiazem was reduced and subsequently stopped altogether. He states he was doing well until mid August.  In mid-August he saw Dr. Rayann Heman.  His pacemaker was reprogrammed to VVIR at that visit.  The patient states that he has not felt as well since that visit.  He was begun to gain weight again.  His weight is up 7 pounds.  He has increased shortness of breath and increased abdominal girth.  He is on Lasix 40 mg every other day.  If he takes higher doses of Lasix and that, his kidney function worsens quickly.  Past Medical History  Diagnosis Date  . Coronary heart disease     stent, CABG, RBBB  . Valvular heart disease     aortic stenosis/regurgitation  . Sarcoid 2011    pulmonary and bone marrow  . Hypercalcemia 2011    due to sarcoidiosis  . Bone marrow disease   . Diabetes mellitus   . Hemorrhagic cystitis 2012  . Gout   . HTN (hypertension)   . A-fib     permanent with tachy-brady syndrome  . Myocardial infarction     1995, 2002, 2006  . OSA (obstructive sleep apnea)     use bipap setting of 10 and 12  . Complication of anesthesia  11-16-13    required alot of versed per anesthesia with cataract surgery  . Chronic kidney disease (CKD), stage III (moderate)     lov note dr Koleen Nimrod nephrology 09-17-13 on chart  . Atrial flutter     hx of  . CHF (congestive heart failure)   . Pneumonia 1966, 2011    hx of  . Anxiety   . Osteoarthritis   . DDD (degenerative disc disease)   . Synovial cyst of lumbar spine     l3-l4, l4-l5, injections  .  Anemia   . Hepatitis     mono  . Cancer     basal cell  . Right sciatic nerve pain     for block thursday 01-21-2014, injections  . Fungal toenail infection   . Biceps tendon tear     right  . Diabetes   . Shortness of breath dyspnea   . Presence of permanent cardiac pacemaker     Past Surgical History  Procedure Laterality Date  . Cataract surgery Right 2007  . Prostate surgery  oct. 2012    TURP  . Vasectomy  1977  . Redo median sternotomy, extracorporeal cirulation, avr, tricuspid valve repair  06/13/2011    AVR(23-mm Edwards pericardial Magna-Ease valve./ TVrepair (34-mm Edwards MC3 annuloplasty ring  . Coronary artery bypass graft  2006    x 5  . Cataract surgery Left 11-16-13  . Cardioversion N/A 12/30/2013    Procedure: CARDIOVERSION;  Surgeon: [name], MD;  Location: [facility] ENDOSCOPY;  Service: Cardiovascular;  Laterality: N/A;  10:49 cardioversion at 120 joules, then 150 joules, to SB  used  Lido $Rem'30mg'ioul$ ,  Propofol 160 mcg  . Colonoscopy N/A 01/29/2014    Procedure: COLONOSCOPY;  Surgeon: Winfield Cunas., MD;  Location[facility];  Service: Endoscopy;  Laterality: N/A;  amanda//ja  . Cardioversion  2003, 2006, 2012, 2013  . Portacath placement    . Portacath removed    . Basal cell carcinoma excision Right 2015    ear  . Total knee arthroplasty Right 05/24/2014    Procedure: RIGHT TOTAL KNEE ARTHROPLASTY;  Surgeon: Gearlean Alf, MD;  Location: [facility];  Service: Orthopedics;  Laterality: Right;  . Ep implantable device N/A 05/05/2015    Procedure: Pacemaker Implant;  Surgeon: Thompson Grayer, MD;  Location: [place] [facility];  Service: Cardiovascular;  Laterality: N/A;  . Insert / replace / remove pacemaker  05/05/2015     Current Outpatient Prescriptions  Medication Sig Dispense Refill  . acetaminophen (TYLENOL) 500 MG tablet Take 1,000 mg by mouth 2 (two) times daily as needed (pain).     [name] allopurinol (ZYLOPRIM) 100 MG tablet Take 200 mg by mouth daily with  breakfast.     . COCONUT OIL PO Apply 1 application topically daily as needed (dry skin).    [name] diazepam (VALIUM) 10 MG tablet Take 1 tablet by mouth daily as needed for anxiety or sleep. Ankle and foot cramps  0  . diphenoxylate-atropine (LOMOTIL) 2.5-0.025 MG per tablet Take 1-2 tablets by mouth daily as needed. stomach  1  . furosemide (LASIX) 40 MG tablet Take 1 tablet (40 mg total) by mouth every other day.    . insulin glargine (LANTUS) 100 unit/mL SOPN Inject 2-3 Units into the skin daily after breakfast. CBG 120-140 2 units, 140-160 3 units    . Magnesium 250 MG TABS Take 125 mg by mouth at bedtime.    . metoprolol (LOPRESSOR) 100 MG tablet Take 100  mg by mouth 2 (two) times daily.    Glory Rosebush VERIO test strip 1 each by Other route 2 (two) times daily. use for testing  6  . Rivaroxaban (XARELTO) 15 MG TABS tablet Take 15 mg by mouth daily with supper.     . terazosin (HYTRIN) 10 MG capsule Take 10 mg by mouth daily with breakfast.    . TERBINAFINE EX Apply 1 drop topically at bedtime. Place 1 drop on each toenail.    . Testosterone Cypionate 200 MG/ML KIT Inject 1 mL into the muscle every 14 (fourteen) days.    . traMADol (ULTRAM) 50 MG tablet Take 50-100 mg by mouth every 4 (four) hours as needed (pain).   0  . zolpidem (AMBIEN) 10 MG tablet Take 10 mg by mouth once.    . verapamil (CALAN-SR) 120 MG CR tablet Take 1 tablet (120 mg total) by mouth 2 (two) times daily. 60 tablet 5   No current facility-administered medications for this visit.    Allergies:   Actos; Hydralazine; Isosorbide dinitrate; Penicillins; Amiodarone; Latex; Codeine; Depakote; Diltiazem; Januvia; Loop diuretics; Losartan; Nsaids; Onglyza; Orudis; Pentazocine lactate; Rozerem; Sertraline hcl; Benazepril hcl; Indomethacin; Minocycline; Poison ivy extract; Potassium iodide; and Shellfish allergy    Social History:  The patient  reports that he quit smoking about 19 years ago. His smoking use included Pipe and  Cigars. He has never used smokeless tobacco. He reports that he drinks alcohol. He reports that he does not use illicit drugs.   Family History:  The patient's family history includes Allergies in an other family member; Asthma in an other family member; Cancer in an other family member; Heart attack in his father; Heart disease in an other family member; Stroke in his father and mother.    ROS:  Please see the history of present illness.   Otherwise, review of systems are positive for none.   All other systems are reviewed and negative.    PHYSICAL EXAM: VS:  BP 172/80 mmHg  Pulse 87  Ht _0  (1.803 m)  Wt 253 lb (114.76 kg)  BMI 35.30 kg/m2 , BMI Body mass index is 35.3 kg/(m^2). GEN: Well nourished, well developed, in no acute distress HEENT: normal Neck: JVP D appears to be elevated.  There are no, carotid bruits, or masses Cardiac: Irregularly irregular rhythm.  Grade 2/6 systolic ejection murmur across the prosthetic aortic valve.  No diastolic murmur heard.  No S3 gallop.  There is trace pretibial edema. Respiratory:  clear to auscultation bilaterally, normal work of breathing GI: Abdomen is very protuberant.  No definite fluid wave is noted. MS: no deformity or atrophy Skin: warm and dry, no rash Neuro:  Strength and sensation are intact Psych: euthymic mood, full affect   EKG:  EKG is ordered today. The ekg ordered today demonstrates atrial fibrillation with irregular ventricular response.  Occasional QRS paced beats.   Recent Labs: 05/10/2015: ALT 9*; B Natriuretic Peptide 510.1*; Hemoglobin 12.4*; Platelets 145* 05/18/2015: BUN 58*; Creatinine, Ser 2.83*; Potassium 4.5; Sodium 133*    Lipid Panel    Component Value Date/Time   CHOL 106 05/28/2014 0140   TRIG 169* 05/28/2014 0140   HDL 14* 05/28/2014 0140   CHOLHDL 7.6 05/28/2014 0140   VLDL 34 05/28/2014 0140   LDLCALC 58 05/28/2014 0140      Wt Readings from Last 3 Encounters:  09/05/15 253 lb (114.76 kg)    08/08/15 246 lb (111.585 kg)  06/01/15 237 lb (107.502 kg)      *  ASSESSMENT AND PLAN: Chronic diastolic CHF (congestive heart failure): Volume stable. Continue current dose of Lasix.  Coronary artery disease involving native coronary artery of native heart without angina pectoris:Continue beta blocker. The patient is not expressing any angina pectoris. Continue Xarelto. He is no longer on aspirin and his bruising has improved significantly.  VALVULAR HEART DISEASE s/p Bioprosthetic AVR and TV Repair: Stable AVR on recent echo and mod TR. Continue SBE prophylaxis.   Sick sinus syndrome S/P placement of cardiac pacemaker: FU with EP as planned.   Persistent atrial fibrillation and Atypical atrial flutter: He did not tolerate diltiazem. He is tolerating Lopressor. Continue current regimen. He remains on Xarelto 15 mg QD.   Chronic kidney disease (CKD), stage III (moderate): Followed by nephrology.  Medications were reviewed today. Any concerns are as listed above. Changes made today include:  Increase Metoprolol to 50 mg Twice daily   Decrease Diltiazem 240 mg Once daily   Current medicines are reviewed at length with the patient today.  The patient does not have concerns regarding medicines.  The following changes have been made:  His ejection fraction is normal at 55-60%.  We will try low dose verapamil ER 120 mg twice a day to help with rate control.  He will continue metoprolol as well.  He does not tolerate diltiazem  Labs/ tests ordered today include: Chest x-ray   Orders Placed This Encounter  Procedures  . DG Chest 2 View  . EKG 12-Lead    Disposition: Continue careful diet.  Try to lose weight.  Continue current dose of Lasix.  He will check back with Dr. Rayann Heman regarding his pacemaker.  Recheck here at his regular visit in November.  Trial of verapamil ER 120 mg twice a day to assist with rate control.   Berna Spare  MD 09/05/2015 1:15 PM    [facility] [address], [place], [place]  [address] Phone: [phone]; Fax: [phone]

## 2015-09-06 ENCOUNTER — Emergency Department (HOSPITAL_COMMUNITY): Payer: Medicare Other

## 2015-09-06 ENCOUNTER — Inpatient Hospital Stay (HOSPITAL_COMMUNITY)
Admission: RE | Admit: 2015-09-06 | Discharge: 2015-09-06 | Disposition: A | Discharge status: Home / Self Care | Payer: Medicare Other | Source: Ambulatory Visit | Attending: Emergency Medicine | Admitting: Emergency Medicine

## 2015-09-06 ENCOUNTER — Observation Stay (HOSPITAL_COMMUNITY)
Admission: EM | Admit: 2015-09-06 | Discharge: 2015-09-08 | Disposition: A | Discharge status: Home / Self Care | Payer: Medicare Other | Attending: Cardiology | Admitting: Cardiology

## 2015-09-06 ENCOUNTER — Encounter (HOSPITAL_COMMUNITY): Payer: Self-pay | Admitting: Emergency Medicine

## 2015-09-06 DIAGNOSIS — I4891 Unspecified atrial fibrillation: Secondary | ICD-10-CM | POA: Diagnosis not present

## 2015-09-06 DIAGNOSIS — N189 Chronic kidney disease, unspecified: Secondary | ICD-10-CM

## 2015-09-06 DIAGNOSIS — N289 Disorder of kidney and ureter, unspecified: Secondary | ICD-10-CM

## 2015-09-06 DIAGNOSIS — E119 Type 2 diabetes mellitus without complications: Secondary | ICD-10-CM

## 2015-09-06 DIAGNOSIS — Z7901 Long term (current) use of anticoagulants: Secondary | ICD-10-CM | POA: Diagnosis not present

## 2015-09-06 DIAGNOSIS — Z952 Presence of prosthetic heart valve: Secondary | ICD-10-CM

## 2015-09-06 DIAGNOSIS — Z8249 Family history of ischemic heart disease and other diseases of the circulatory system: Secondary | ICD-10-CM | POA: Insufficient documentation

## 2015-09-06 DIAGNOSIS — N179 Acute kidney failure, unspecified: Secondary | ICD-10-CM | POA: Diagnosis present

## 2015-09-06 DIAGNOSIS — N400 Enlarged prostate without lower urinary tract symptoms: Secondary | ICD-10-CM | POA: Diagnosis present

## 2015-09-06 DIAGNOSIS — I482 Chronic atrial fibrillation, unspecified: Secondary | ICD-10-CM | POA: Diagnosis present

## 2015-09-06 DIAGNOSIS — I129 Hypertensive chronic kidney disease with stage 1 through stage 4 chronic kidney disease, or unspecified chronic kidney disease: Secondary | ICD-10-CM | POA: Insufficient documentation

## 2015-09-06 DIAGNOSIS — E1122 Type 2 diabetes mellitus with diabetic chronic kidney disease: Secondary | ICD-10-CM | POA: Insufficient documentation

## 2015-09-06 DIAGNOSIS — N183 Chronic kidney disease, stage 3 unspecified: Secondary | ICD-10-CM | POA: Diagnosis present

## 2015-09-06 DIAGNOSIS — I481 Persistent atrial fibrillation: Secondary | ICD-10-CM | POA: Insufficient documentation

## 2015-09-06 DIAGNOSIS — I2510 Atherosclerotic heart disease of native coronary artery without angina pectoris: Secondary | ICD-10-CM | POA: Diagnosis not present

## 2015-09-06 DIAGNOSIS — Z951 Presence of aortocoronary bypass graft: Secondary | ICD-10-CM | POA: Diagnosis not present

## 2015-09-06 DIAGNOSIS — I5033 Acute on chronic diastolic (congestive) heart failure: Principal | ICD-10-CM | POA: Diagnosis present

## 2015-09-06 DIAGNOSIS — I447 Left bundle-branch block, unspecified: Secondary | ICD-10-CM | POA: Insufficient documentation

## 2015-09-06 DIAGNOSIS — I509 Heart failure, unspecified: Secondary | ICD-10-CM | POA: Diagnosis present

## 2015-09-06 DIAGNOSIS — Z87891 Personal history of nicotine dependence: Secondary | ICD-10-CM | POA: Diagnosis not present

## 2015-09-06 DIAGNOSIS — G4733 Obstructive sleep apnea (adult) (pediatric): Secondary | ICD-10-CM | POA: Diagnosis not present

## 2015-09-06 DIAGNOSIS — I10 Essential (primary) hypertension: Secondary | ICD-10-CM | POA: Diagnosis not present

## 2015-09-06 DIAGNOSIS — R0600 Dyspnea, unspecified: Secondary | ICD-10-CM | POA: Insufficient documentation

## 2015-09-06 DIAGNOSIS — E785 Hyperlipidemia, unspecified: Secondary | ICD-10-CM | POA: Insufficient documentation

## 2015-09-06 DIAGNOSIS — Z950 Presence of cardiac pacemaker: Secondary | ICD-10-CM | POA: Diagnosis not present

## 2015-09-06 DIAGNOSIS — F419 Anxiety disorder, unspecified: Secondary | ICD-10-CM | POA: Insufficient documentation

## 2015-09-06 LAB — BASIC METABOLIC PANEL
ANION GAP: 11 (ref 5–15)
BUN: 55 mg/dL — AB (ref 6–20)
CALCIUM: 8.6 mg/dL — AB (ref 8.9–10.3)
CO2: 19 mmol/L — ABNORMAL LOW (ref 22–32)
Chloride: 103 mmol/L (ref 101–111)
Creatinine, Ser: 3.36 mg/dL — ABNORMAL HIGH (ref 0.61–1.24)
GFR calc Af Amer: 19 mL/min — ABNORMAL LOW (ref >60)
GFR, EST NON AFRICAN AMERICAN: 17 mL/min — AB (ref >60)
GLUCOSE: 165 mg/dL — AB (ref 65–99)
POTASSIUM: 4.8 mmol/L (ref 3.5–5.1)
SODIUM: 133 mmol/L — AB (ref 135–145)

## 2015-09-06 LAB — HEPATIC FUNCTION PANEL
ALBUMIN: 3.8 g/dL (ref 3.5–5.0)
ALK PHOS: 52 U/L (ref 38–126)
ALT: 16 U/L — AB (ref 17–63)
AST: 24 U/L (ref 15–41)
BILIRUBIN TOTAL: 1.4 mg/dL — AB (ref 0.3–1.2)
Bilirubin, Direct: 0.4 mg/dL (ref 0.1–0.5)
Indirect Bilirubin: 1 mg/dL — ABNORMAL HIGH (ref 0.3–0.9)
TOTAL PROTEIN: 6.9 g/dL (ref 6.5–8.1)

## 2015-09-06 LAB — CBC
HEMATOCRIT: 40.7 % (ref 39.0–52.0)
Hemoglobin: 13.3 g/dL (ref 13.0–17.0)
MCH: 25.8 pg — ABNORMAL LOW (ref 26.0–34.0)
MCHC: 32.7 g/dL (ref 30.0–36.0)
MCV: 79 fL (ref 78.0–100.0)
Platelets: 79 10*3/uL — ABNORMAL LOW (ref 150–400)
RBC: 5.15 MIL/uL (ref 4.22–5.81)
RDW: 17.9 % — AB (ref 11.5–15.5)
WBC: 6.7 10*3/uL (ref 4.0–10.5)

## 2015-09-06 LAB — BRAIN NATRIURETIC PEPTIDE: B Natriuretic Peptide: 590.2 pg/mL — ABNORMAL HIGH (ref 0.0–100.0)

## 2015-09-06 LAB — LIPASE, BLOOD: LIPASE: 25 U/L (ref 22–51)

## 2015-09-06 LAB — TROPONIN I: Troponin I: 0.04 ng/mL — ABNORMAL HIGH (ref <0.031)

## 2015-09-06 LAB — PROTIME-INR
INR: 1.75 — ABNORMAL HIGH (ref 0.00–1.49)
Prothrombin Time: 20.4 seconds — ABNORMAL HIGH (ref 11.6–15.2)

## 2015-09-06 LAB — GLUCOSE, CAPILLARY: Glucose-Capillary: 176 mg/dL — ABNORMAL HIGH (ref 65–99)

## 2015-09-06 LAB — I-STAT TROPONIN, ED: TROPONIN I, POC: 0.03 ng/mL (ref 0.00–0.08)

## 2015-09-06 MED ORDER — ZOLPIDEM TARTRATE 5 MG PO TABS
5.0000 mg | ORAL_TABLET | Freq: Every day | ORAL | Status: DC
  Administered 2015-09-06 – 2015-09-07 (×2): 5 mg via ORAL
  Filled 2015-09-06 (×2): qty 1

## 2015-09-06 MED ORDER — TERAZOSIN HCL 5 MG PO CAPS
10.0000 mg | ORAL_CAPSULE | Freq: Every day | ORAL | Status: DC
  Administered 2015-09-07 – 2015-09-08 (×2): 10 mg via ORAL
  Filled 2015-09-06 (×3): qty 2

## 2015-09-06 MED ORDER — RIVAROXABAN 15 MG PO TABS
15.0000 mg | ORAL_TABLET | Freq: Every evening | ORAL | Status: DC
  Administered 2015-09-06 – 2015-09-07 (×2): 15 mg via ORAL
  Filled 2015-09-06 (×2): qty 1

## 2015-09-06 MED ORDER — ZOLPIDEM TARTRATE 5 MG PO TABS: 10.0000 mg | ORAL_TABLET | Freq: Every day | ORAL | Status: DC

## 2015-09-06 MED ORDER — ALLOPURINOL 100 MG PO TABS
200.0000 mg | ORAL_TABLET | Freq: Every day | ORAL | Status: DC
  Administered 2015-09-07 – 2015-09-08 (×2): 200 mg via ORAL
  Filled 2015-09-06 (×2): qty 2

## 2015-09-06 MED ORDER — METOPROLOL TARTRATE 100 MG PO TABS
100.0000 mg | ORAL_TABLET | Freq: Two times a day (BID) | ORAL | Status: DC
  Administered 2015-09-06 – 2015-09-08 (×4): 100 mg via ORAL
  Filled 2015-09-06 (×4): qty 1

## 2015-09-06 MED ORDER — VERAPAMIL HCL ER 120 MG PO TBCR
120.0000 mg | EXTENDED_RELEASE_TABLET | Freq: Two times a day (BID) | ORAL | Status: DC
  Administered 2015-09-06 – 2015-09-08 (×4): 120 mg via ORAL
  Filled 2015-09-06 (×6): qty 1

## 2015-09-06 MED ORDER — FUROSEMIDE 10 MG/ML IJ SOLN
40.0000 mg | Freq: Once | INTRAMUSCULAR | Status: AC
  Administered 2015-09-06: 40 mg via INTRAVENOUS
  Filled 2015-09-06: qty 4

## 2015-09-06 MED ORDER — INSULIN GLARGINE 100 UNIT/ML ~~LOC~~ SOLN
2.0000 [IU] | Freq: Every day | SUBCUTANEOUS | Status: DC
  Filled 2015-09-06 (×2): qty 0.03

## 2015-09-06 MED ORDER — SODIUM CHLORIDE 0.9 % IJ SOLN
3.0000 mL | Freq: Two times a day (BID) | INTRAMUSCULAR | Status: DC
  Administered 2015-09-06 – 2015-09-08 (×4): 3 mL via INTRAVENOUS

## 2015-09-06 MED ORDER — FUROSEMIDE 10 MG/ML IJ SOLN
40.0000 mg | Freq: Two times a day (BID) | INTRAMUSCULAR | Status: DC
  Administered 2015-09-07: 40 mg via INTRAVENOUS
  Filled 2015-09-06: qty 4

## 2015-09-06 NOTE — ED Notes (Signed)
Pt with increased SOB and abd distention; pt with hx of ascites; pt sts increased SOB x 3 days

## 2015-09-06 NOTE — ED Notes (Signed)
Pt states he can't make that much urine at this time Dr. Lonie Peak notified, and states no need to cath the pt for in and out.

## 2015-09-06 NOTE — ED Provider Notes (Signed)
CSN: [id number]     Arrival date & time 09/06/15  1707 History   First MD Initiated Contact with Patient 09/06/15 1752     Chief Complaint  Patient presents with  . Shortness of Breath     (Consider location/radiation/quality/duration/timing/severity/associated sxs/prior Treatment) HPI The patient reports he has a known history of congestive heart failure. For about 2 weeks now he has had increasing shortness of breath. This has gotten worse over the past 2-3 days. He states typically when he gets fluid retention, it is in the form of abdominal distention. He has not noted significant increased swelling in the legs. He does report an approximately 7-8 pound weight gain over the past 3 weeks. The patient has been taking Lasix 40 mg every other day, that does however had been increased to daily and within the past 24 hours he has had an 80 mg dose. He reports despite this he has had decreased urine output. There has not been associated chest pain. He has had decreased exercise tolerance. Past Medical History  Diagnosis Date  . Coronary heart disease     stent, CABG, RBBB  . Valvular heart disease     aortic stenosis/regurgitation  . Sarcoid 2011    pulmonary and bone marrow  . Hypercalcemia 2011    due to sarcoidiosis  . Bone marrow disease   . Diabetes mellitus   . Hemorrhagic cystitis 2012  . Gout   . HTN (hypertension)   . A-fib     permanent with tachy-brady syndrome  . Myocardial infarction     1995, 2002, 2006  . OSA (obstructive sleep apnea)     use bipap setting of 10 and 12  . Complication of anesthesia 11-16-13    required alot of versed per anesthesia with cataract surgery  . Chronic kidney disease (CKD), stage III (moderate)     lov note dr Koleen Nimrod nephrology 09-17-13 on chart  . Atrial flutter     hx of  . CHF (congestive heart failure)   . Pneumonia 1966, 2011    hx of  . Anxiety   . Osteoarthritis   . DDD (degenerative disc disease)   . Synovial cyst of  lumbar spine     l3-l4, l4-l5, injections  . Anemia   . Hepatitis     mono  . Cancer     basal cell  . Right sciatic nerve pain     for block thursday 01-21-2014, injections  . Fungal toenail infection   . Biceps tendon tear     right  . Diabetes   . Shortness of breath dyspnea   . Presence of permanent cardiac pacemaker    Past Surgical History  Procedure Laterality Date  . Cataract surgery Right 2007  . Prostate surgery  oct. 2012    TURP  . Vasectomy  1977  . Redo median sternotomy, extracorporeal cirulation, avr, tricuspid valve repair  06/13/2011    AVR(23-mm Edwards pericardial Magna-Ease valve./ TVrepair (34-mm Edwards MC3 annuloplasty ring  . Coronary artery bypass graft  2006    x 5  . Cataract surgery Left 11-16-13  . Cardioversion N/A 12/30/2013    Procedure: CARDIOVERSION;  Surgeon: [name], MD;  Location: [facility] END[facility]COPY;  Service: Cardiovascular;  Laterality: N/A;  10:49 cardioversion at 120 joules, then 150 joules, to SB  used  Lido 88m,  Propofol 160 mcg  . Colonoscopy N/A 01/29/2014    Procedure: COLONOSCOPY;  Surgeon: JWinfield Cunas, MD;  Location: [facility] ENDOSCOPY;  Service: Endoscopy;  Laterality: N/A;  amanda//ja  . Cardioversion  2003, 2006, 2012, 2013  . Portacath placement    . Portacath removed    . Basal cell carcinoma excision Right 2015    ear  . Total knee arthroplasty Right 05/24/2014    Procedure: RIGHT TOTAL KNEE ARTHROPLASTY;  Surgeon: Gearlean Alf, MD;  Location: [facility] ORS;  Service: Orthopedics;  Laterality: Right;  . Ep implantable device N/A 05/05/2015    Procedure: Pacemaker Implant;  Surgeon: Thompson Grayer, MD;  Location: [facility];  Service: Cardiovascular;  Laterality: N/A;  . Insert / replace / remove pacemaker  05/05/2015   Family History  Problem Relation Age of Onset  . Heart attack Father   . Stroke Father   . Allergies    . Asthma    . Heart disease    . Cancer    . Stroke Mother    Social History  Substance  Use Topics  . Smoking status: Former Smoker -- 28 years    Types: Pipe, Cigars    Quit date: 12/25/1995  . Smokeless tobacco: Never Used  . Alcohol Use: Yes     Comment: 3-5 beer or wine per week    Review of Systems 10 Systems reviewed and are negative for acute change except as noted in the HPI.   Allergies  Actos; Hydralazine; Isosorbide dinitrate; Penicillins; Potassium iodide; Amiodarone; Latex; Codeine; Depakote; Diltiazem; Januvia; Loop diuretics; Losartan; Nsaids; Onglyza; Orudis; Pentazocine lactate; Rozerem; Sertraline hcl; Benazepril hcl; Indomethacin; Minocycline; Poison ivy extract; and Shellfish allergy  Home Medications   Prior to Admission medications   Medication Sig Start Date End Date Taking? Authorizing Provider  acetaminophen (TYLENOL) 500 MG tablet Take 1,000 mg by mouth 2 (two) times daily as needed (pain).    Yes Historical Provider, MD  allopurinol (ZYLOPRIM) 100 MG tablet Take 200 mg by mouth daily with breakfast.  08/06/11  Yes [name], MD  COCONUT OIL PO Apply 1 application topically daily as needed (dry skin).   Yes Historical Provider, MD  diazepam (VALIUM) 10 MG tablet Take 1 tablet by mouth daily as needed for anxiety or sleep. Ankle and foot cramps 04/29/15  Yes Historical Provider, MD  diphenoxylate-atropine (LOMOTIL) 2.5-0.025 MG per tablet Take 1-2 tablets by mouth daily as needed. stomach 04/06/15  Yes Historical Provider, MD  insulin glargine (LANTUS) 100 unit/mL SOPN Inject 2-3 Units into the skin daily after breakfast. CBG 120-140 2 units, 140-160 3 units   Yes Historical Provider, MD  Magnesium 250 MG TABS Take 125 mg by mouth 2 (two) times daily.    Yes Historical Provider, MD  metoprolol (LOPRESSOR) 100 MG tablet Take 100 mg by mouth 2 (two) times daily.   Yes Historical Provider, MD  terazosin (HYTRIN) 10 MG capsule Take 10 mg by mouth daily with breakfast. 08/15/11  Yes [name], MD  TERBINAFINE EX Apply 1 drop topically at  bedtime. Place 1 drop on each toenail.   Yes Historical Provider, MD  Testosterone Cypionate 200 MG/ML KIT Inject 1 mL into the muscle every 14 (fourteen) days.   Yes Historical Provider, MD  verapamil (CALAN-SR) 120 MG CR tablet Take 1 tablet (120 mg total) by mouth 2 (two) times daily. 09/05/15  Yes [name], MD  XARELTO 15 MG TABS tablet Take 15 mg by mouth every evening. 09/05/15  Yes Historical Provider, MD  zolpidem (AMBIEN) 10 MG tablet Take 10 mg by mouth at bedtime.    Yes Historical  Provider, MD  furosemide (LASIX) 40 MG tablet Take 1 tablet (32m) by mouth every Tuesday, Thursday, Sat and Sun.Take an extras lasix (483m if weight is up by 2lb in one day or 5 lb in one week. 09/08/15   Bhavinkumar Bhagat, PA   BP 113/65 mmHg  Pulse 65  Temp(Src) 97.8 F (36.6 C) (Oral)  Resp 18  Ht 5' 11"  (1.803 m)  Wt 248 lb 14.4 oz (112.9 kg)  BMI 34.73 kg/m2  SpO2 95% Physical Exam  Constitutional: He is oriented to person, place, and time.  Patient is moderately obese. He is alert and nontoxic. No acute respiratory distress at rest. Mental status is clear.  HENT:  Head: Normocephalic and atraumatic.  Eyes: Conjunctivae and EOM are normal.  Neck: Neck supple.  Cardiovascular: Normal rate, regular rhythm and intact distal pulses.   1 to 2/6 systolic ejection murmur.  Pulmonary/Chest: Effort normal.  Slight decreased breath sounds in the right base with mild radial present. Good air flow bilaterally with normal air flow to left base.  Abdominal: Soft. Bowel sounds are normal. He exhibits distension. There is tenderness.  Very mild right upper quadrant tenderness without guarding or rebound.  Musculoskeletal: Normal range of motion. He exhibits edema.  1+ pain edema bilateral lower extremities. Skin thinning consistent with chronic venous stasis with no active cellulitis or stasis wounds.  Neurological: He is alert and oriented to person, place, and time. He has normal strength. No  cranial nerve deficit. He exhibits normal muscle tone. Coordination normal. GCS eye subscore is 4. GCS verbal subscore is 5. GCS motor subscore is 6.  Skin: Skin is warm, dry and intact.  Psychiatric: He has a normal mood and affect.    ED Course  Procedures (including critical care time) Labs Review Labs Reviewed  BASIC METABOLIC PANEL - Abnormal; Notable for the following:    Sodium 133 (*)    CO2 19 (*)    Glucose, Bld 165 (*)    BUN 55 (*)    Creatinine, Ser 3.36 (*)    Calcium 8.6 (*)    GFR calc non Af Amer 17 (*)    GFR calc Af Amer 19 (*)    All other components within normal limits  CBC - Abnormal; Notable for the following:    MCH 25.8 (*)    RDW 17.9 (*)    Platelets 79 (*)    All other components within normal limits  BRAIN NATRIURETIC PEPTIDE - Abnormal; Notable for the following:    B Natriuretic Peptide 590.2 (*)    All other components within normal limits  TROPONIN I - Abnormal; Notable for the following:    Troponin I 0.04 (*)    All other components within normal limits  PROTIME-INR - Abnormal; Notable for the following:    Prothrombin Time 20.4 (*)    INR 1.75 (*)    All other components within normal limits  URINALYSIS, ROUTINE W REFLEX [facility]CROSCOPIC [facility]- [facility]; Notable for the following:    APPearance CLOUDY (*)    Protein, ur 30 (*)    All other components within normal limits  HEPATIC FUNCTION PANEL - Abnormal; Notable for the following:    ALT 16 (*)    Total Bilirubin 1.4 (*)    Indirect Bilirubin 1.0 (*)    All other components within normal limits  TROPONIN I - Abnormal; Notable for the following:    Troponin I 0.04 (*)    All other components  within normal limits  TROPONIN I - Abnormal; Notable for the following:    Troponin I 0.05 (*)    All other components within normal limits  TROPONIN I - Abnormal; Notable for the following:    Troponin I 0.06 (*)    All other components within normal limits  CBC - Abnormal; Notable for  the following:    Hemoglobin 12.1 (*)    HCT 37.4 (*)    MCH 25.4 (*)    RDW 18.1 (*)    Platelets 84 (*)    All other components within normal limits  GLUCOSE, CAPILLARY - Abnormal; Notable for the following:    Glucose-Capillary 176 (*)    All other components within normal limits  URINE MICROSCOPIC-ADD ON - Abnormal; Notable for the following:    Squamous Epithelial / LPF FEW (*)    Casts HYALINE CASTS (*)    All other components within normal limits  BASIC METABOLIC PANEL - Abnormal; Notable for the following:    Sodium 132 (*)    CO2 21 (*)    Glucose, Bld 129 (*)    BUN 56 (*)    Creatinine, Ser 3.43 (*)    Calcium 8.3 (*)    GFR calc non Af Amer 16 (*)    GFR calc Af Amer 19 (*)    All other components within normal limits  GLUCOSE, CAPILLARY - Abnormal; Notable for the following:    Glucose-Capillary 158 (*)    All other components within normal limits  GLUCOSE, CAPILLARY - Abnormal; Notable for the following:    Glucose-Capillary 169 (*)    All other components within normal limits  GLUCOSE, CAPILLARY - Abnormal; Notable for the following:    Glucose-Capillary 137 (*)    All other components within normal limits  GLUCOSE, CAPILLARY - Abnormal; Notable for the following:    Glucose-Capillary 140 (*)    All other components within normal limits  GLUCOSE, CAPILLARY - Abnormal; Notable for the following:    Glucose-Capillary 101 (*)    All other components within normal limits  BASIC METABOLIC PANEL - Abnormal; Notable for the following:    Sodium 134 (*)    Glucose, Bld 138 (*)    BUN 58 (*)    Creatinine, Ser 3.27 (*)    Calcium 8.7 (*)    GFR calc non Af Amer 17 (*)    GFR calc Af Amer 20 (*)    All other components within normal limits  CBC WITH DIFFERENTIAL/PLATELET - Abnormal; Notable for the following:    Hemoglobin 12.7 (*)    MCH 25.3 (*)    RDW 18.3 (*)    Platelets 85 (*)    Lymphs Abs 0.4 (*)    All other components within normal limits   GLUCOSE, CAPILLARY - Abnormal; Notable for the following:    Glucose-Capillary 130 (*)    All other components within normal limits  LIPASE, BLOOD  TSH  I-STAT TROPOININ, ED    Imaging Review No results found. I have personally reviewed and evaluated these images and lab results as part of my medical decision-making.   EKG Interpretation   Date/Time:  Tuesday September 06 2015 17:34:23 EDT Ventricular Rate:  70 PR Interval:    QRS Duration: 238 QT Interval:  570 QTC Calculation: 615 R Axis:   -102 Text Interpretation:  Ventricular-paced rhythm with occasional Premature  ventricular complexes Abnormal ECG Confirmed by FLOYD MD, DANIEL ([id number])  on 09/06/2015 5:50:38 PM  MDM   Final diagnoses:  Acute on chronic congestive heart failure, unspecified congestive heart failure type  Renal insufficiency   Patient is experiencing increasing symptoms of congestive heart failure. Renal insufficiency has developed in the setting of increased Lasix use. The patient will be admitted to cardiology for monitoring and treatment.    Charlesetta Shanks, MD 09/09/15 1630

## 2015-09-06 NOTE — H&P (Signed)
Primary Cardiologist: Dr. 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 He is on long-term anticoagulation with Xarelto. He has had multiple cardioversions in the past. His last cardioversion was in January 2015. 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 Echocardiogram in June 2015 demonstrated normal LV function, moderate aortic stenosis. Repeat echocardiogram in 11/2014 demonstrated normal LV function, severe LVH, bioprosthetic AVR with mean gradient of 29 and moderate LAE. He had been admitted in March 2016 after a mechanical fall resulting in fractured seventh rib on the right. He returned to the hospital with acute renal failure and idioventricular rhythm with heart rate as low as 24. Hospitalization was complicated by Mallory-Weiss tear. He improved with hydration. He was recently seen 04/26/15. He was back in atrial flutter with slow ventricular response. He was referred to EP.  He was seen by Dr. Rayann Heman 05/04/15. The patient was noted to have symptomatic persistent atrial fibrillation and atypical atrial flutter with tachycardia with minimal exertion. Options included pacemaker implantation with medical rate control versus ablation. It was ultimately decided to proceed with pacemaker implantation. If this was unsuccessful, consideration could be given towards AV nodal ablation in the future. He underwent implantation of a dual-chamber St. Jude pacemaker on 05/05/15.   He was seen urgently in the office 05/10/15 with evidence of a/c diastolic HF. He was admitted for 48 hours and diuresed with Lasix. Troponins were minimally elevated. Creatinine improved  slightly. Echo demonstrated normal LVF with stable gradients across his AVR. Amlodipine was DC'd and Diltiazem was increased.However, he subsequently began having marked fatigue which he attributed to the diltiazem. The diltiazem was reduced and subsequently stopped altogether. He states he was doing well until mid August. In mid-August he saw Dr. Rayann Heman. His pacemaker was reprogrammed to VVIR at that visit. The patient states that he has not felt as well since that visit. He was begun to gain weight again. His weight is up 7 pounds. He has increased shortness of breath and increased abdominal girth. He is on Lasix 40 mg every other day. If he takes higher doses of Lasix and that, his kidney function worsens quickly."  He came to ed with c/o worsening shortness of breath and wt gain in last week without any diuresis with lasix. He is NYHA Class III at this point ekg LBBB 200 msec wider than his baseline a month ago . PPM recently changed to VVI  Echo has moderate to severe prosthetic AS mean gradient 30 mmHg, AVA 0.82 , DI 0.26    Review of Systems:     Cardiac Review of Systems: {Y] = yes [ ]  = no  Chest Pain [    ]  Resting SOB Blue.Reese   ] Exertional SOB  [ y ]  Orthopnea [ y ]   Pedal Edema Blue.Reese   ]    Palpitations [  ] Syncope  [  ]   Presyncope [   ]  General Review of Systems: [Y] = yes [  ]=no Constitional: recent weight change y[  ]; anorexia [  ]; fatigue [  ]; nausea [  ]; night sweats [  ]; fever [  ]; or chills [  ];  Dental: poor dentition[  ];   Eye : blurred vision [  ]; diplopia [   ]; vision changes [  ];  Amaurosis fugax[  ]; Resp: cough [  ];  wheezing[  ];  hemoptysis[  ]; shortness of breath[  ]; paroxysmal nocturnal dyspnea[  ]; dyspnea on exertion[  ]; or orthopnea[  ];  GI:  gallstones[  ], vomiting[  ];  dysphagia[  ]; melena[  ];  hematochezia [  ]; heartburn[  ];   GU: kidney stones [  ]; hematuria[   ];   dysuria [  ];  nocturia[  ];               Skin: rash [  ], swelling[  ];, hair loss[  ];  peripheral edema[  ];  or itching[  ]; Musculosketetal: myalgias[  ];  joint swelling[  ];  joint erythema[  ];  joint pain[  ];  back pain[  ];  Heme/Lymph: bruising[  ];  bleeding[  ];  anemia[  ];  Neuro: TIA[  ];  headaches[  ];  stroke[  ];  vertigo[  ];  seizures[  ];   paresthesias[  ];  difficulty walking[  ];  Psych:depression[  ]; anxiety[  ];  Endocrine: diabetes[  ];  thyroid dysfunction[  ];  Other:  Past Medical History  Diagnosis Date  . Coronary heart disease     stent, CABG, RBBB  . Valvular heart disease     aortic stenosis/regurgitation  . Sarcoid 2011    pulmonary and bone marrow  . Hypercalcemia 2011    due to sarcoidiosis  . Bone marrow disease   . Diabetes mellitus   . Hemorrhagic cystitis 2012  . Gout   . HTN (hypertension)   . A-fib     permanent with tachy-brady syndrome  . Myocardial infarction     1995, 2002, 2006  . OSA (obstructive sleep apnea)     use bipap setting of 10 and 12  . Complication of anesthesia 11-16-13    required alot of versed per anesthesia with cataract surgery  . Chronic kidney disease (CKD), stage III (moderate)     lov note dr Koleen Nimrod nephrology 09-17-13 on chart  . Atrial flutter     hx of  . CHF (congestive heart failure)   . Pneumonia 1966, 2011    hx of  . Anxiety   . Osteoarthritis   . DDD (degenerative disc disease)   . Synovial cyst of lumbar spine     l3-l4, l4-l5, injections  . Anemia   . Hepatitis     mono  . Cancer     basal cell  . Right sciatic nerve pain     for block thursday 01-21-2014, injections  . Fungal toenail infection   . Biceps tendon tear     right  . Diabetes   . Shortness of breath dyspnea   . Presence of permanent cardiac pacemaker     Current facility-administered medications:  .  [START ON 09/07/2015] allopurinol (ZYLOPRIM) tablet 200 mg, 200 mg, Oral, Q breakfast, Dannya Pitkin Durenda Age, MD .  Derrill Memo ON 09/07/2015] furosemide (LASIX) injection 40 mg, 40 mg, Intravenous, BID, Dametria Tuzzolino Durenda Age, MD .  Derrill Memo ON 09/07/2015] insulin glargine (LANTUS) Solostar Pen 2-3 Units, 2-3 Units, Subcutaneous, QPC breakfast, Lariza Cothron Durenda Age, MD .  Derrill Memo ON 09/07/2015] metoprolol tartrate (LOPRESSOR) tablet 100 mg, 100 mg, Oral, BID, Loretha Ure Durenda Age, MD .  Derrill Memo ON  09/07/2015] Rivaroxaban (XARELTO) tablet 15 mg, 15 mg, Oral, QPM, Demetrica Zipp Durenda Age, MD .  sodium chloride 0.9 % injection 3 mL, 3 mL, Intravenous, Q12H, Park Beck Durenda Age, MD .  Derrill Memo ON 09/07/2015] terazosin (HYTRIN) capsule 10 mg, 10 mg, Oral, Q breakfast, Marielle Mantione Durenda Age, MD .  Derrill Memo ON 09/07/2015] verapamil (CALAN-SR) CR tablet 120 mg, 120 mg, Oral, BID, Jakie Debow Durenda Age, MD .  zolpidem (AMBIEN) tablet 10 mg, 10 mg, Oral, QHS, Seana Underwood Durenda Age, MD  Current outpatient prescriptions:  .  acetaminophen (TYLENOL) 500 MG tablet, Take 1,000 mg by mouth 2 (two) times daily as needed (pain). , Disp: , Rfl:  .  allopurinol (ZYLOPRIM) 100 MG tablet, Take 200 mg by mouth daily with breakfast. , Disp: , Rfl:  .  COCONUT OIL PO, Apply 1 application topically daily as needed (dry skin)., Disp: , Rfl:  .  diazepam (VALIUM) 10 MG tablet, Take 1 tablet by mouth daily as needed for anxiety or sleep. Ankle and foot cramps, Disp: , Rfl: 0 .  diphenoxylate-atropine (LOMOTIL) 2.5-0.025 MG per tablet, Take 1-2 tablets by mouth daily as needed. stomach, Disp: , Rfl: 1 .  furosemide (LASIX) 40 MG tablet, Take 1 tablet (40 mg total) by mouth every other day. (Patient taking differently: Take 40 mg by mouth daily. ), Disp: , Rfl:  .  insulin glargine (LANTUS) 100 unit/mL SOPN, Inject 2-3 Units into the skin daily after breakfast. CBG 120-140 2 units, 140-160 3 units, Disp: , Rfl:  .  Magnesium 250 MG TABS, Take 125 mg by mouth 2 (two) times daily. , Disp: , Rfl:  .  metoprolol (LOPRESSOR) 100 MG tablet, Take 100  mg by mouth 2 (two) times daily., Disp: , Rfl:  .  terazosin (HYTRIN) 10 MG capsule, Take 10 mg by mouth daily with breakfast., Disp: , Rfl:  .  TERBINAFINE EX, Apply 1 drop topically at bedtime. Place 1 drop on each toenail., Disp: , Rfl:  .  Testosterone Cypionate 200 MG/ML KIT, Inject 1 mL into the muscle every 14 (fourteen) days., Disp: , Rfl:  .  verapamil (CALAN-SR) 120 MG CR tablet, Take 1 tablet (120 mg total) by mouth 2 (two) times daily., Disp: 60 tablet, Rfl: 5 .  XARELTO 15 MG TABS tablet, Take 15 mg by mouth every evening., Disp: , Rfl: 0 .  zolpidem (AMBIEN) 10 MG tablet, Take 10 mg by mouth at bedtime. , Disp: , Rfl:    Allergies  Allergen Reactions  . Actos [Pioglitazone Hydrochloride] Swelling and Other (See Comments)    Severe peripheral edema  . Hydralazine     Severe chills and SOB   . Isosorbide Dinitrate     Severe chills and SOB   . Penicillins Swelling and Other (See Comments)    Serum sickness  . Potassium Iodide Itching and Rash    Severe entire body itching and rash  . Amiodarone Other (See Comments)    diarrhea  . Latex Swelling  . Codeine Other (See Comments)    Makes Pt aggressive  . Depakote [Divalproex Sodium] Diarrhea    severe  . Diltiazem     lethargy and dyspnea  . Januvia [Sitagliptin] Diarrhea and Other (See Comments)    bradycardia  . Loop Diuretics Other (See Comments)    Spike in BUN and creatine levels  . Losartan Other (See Comments)    aphysia  . Nsaids Other (See Comments)    Renal problems  . Onglyza [Saxagliptin] Diarrhea and  Other (See Comments)    bradycardia  . Orudis [Ketoprofen] Other (See Comments)    Cannot take due to renal insufficiency  . Pentazocine Lactate Other (See Comments)    Unknown allergic reaction - pt and wife do not recall this  . Rozerem [Ramelteon] Diarrhea    Severe   . Sertraline Hcl Other (See Comments)    Pt and wife do not recall this  . Benazepril Hcl Other (See Comments)    ? Possible  lowers plateletes  . Indomethacin Other (See Comments)    Renal problems   . Minocycline Other (See Comments)    Makes dizzy and feel just lousy.  . Poison Ivy Extract [Extract Of Poison Ivy] Rash and Other (See Comments)    blisters  . Shellfish Allergy Rash    Social History   Social History  . Marital Status: Married    Spouse Name: N/A  . Number of Children: 4  . Years of Education: N/A   Occupational History  . retired Administrator, [employer]     due to coronary disease in 2000  . 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 Smoking status: Former Smoker -- 28 years    Types: Pipe, Cigars    Quit date: 12/25/1995  . Smokeless tobacco: Never Used  . Alcohol Use: Yes     Comment: 3-5 beer or wine per week  . Drug Use: No  . Sexual Activity: Not on file   Other Topics Concern  . Not on file   Social History Narrative    Family History  Problem Relation Age of Onset  . Heart attack Father   . Stroke Father   . Allergies    . Asthma    . Heart disease    . Cancer    . Stroke Mother     PHYSICAL EXAM: Filed Vitals:   09/06/15 2130  BP: 115/64  Pulse:   Temp:   Resp: 18   General:  Well appearing. No respiratory difficulty HEENT: normal Neck: supple.Carotids 2+ bilat; no bruits. No lymphadenopathy or thryomegaly appreciated. JVD mid neck Cor: PMI nondisplaced. Regular rate & rhythm. 3/6 systolic murmur of TR / MR, could not auscultate A2  Lungs: clear Abdomen: soft, nontender, nondistended. No hepatosplenomegaly. No bruits or masses. Good bowel sounds. Extremities: no cyanosis, clubbing, rash, edema Neuro: alert & oriented x 3, cranial nerves grossly intact. moves all 4 extremities w/o difficulty.   ECG:  Results for orders placed or performed during the hospital encounter of 09/06/15 (from the past 24 hour(s))  Basic metabolic panel     Status: Abnormal   Collection Time: 09/06/15  5:45 PM  Result Value Ref Range   Sodium  133 (L) 135 - 145 mmol/L   Potassium 4.8 3.5 - 5.1 mmol/L   Chloride 103 101 - 111 mmol/L   CO2 19 (L) 22 - 32 mmol/L   Glucose, Bld 165 (H) 65 - 99 mg/dL   BUN 55 (H) 6 - 20 mg/dL   Creatinine, Ser 3.36 (H) 0.61 - 1.24 mg/dL   Calcium 8.6 (L) 8.9 - 10.3 mg/dL   GFR calc non Af Amer 17 (L) >60 mL/min   GFR calc Af Amer 19 (L) >60 mL/min   Anion gap 11 5 - 15  CBC     Status: Abnormal   Collection Time: 09/06/15  5:45 PM  Result Value Ref Range   WBC 6.7 4.0 - 10.5 K/uL  RBC 5.15 4.22 - 5.81 MIL/uL   Hemoglobin 13.3 13.0 - 17.0 g/dL   HCT 40.7 39.0 - 52.0 %   MCV 79.0 78.0 - 100.0 fL   MCH 25.8 (L) 26.0 - 34.0 pg   MCHC 32.7 30.0 - 36.0 g/dL   RDW 17.9 (H) 11.5 - 15.5 %   Platelets 79 (L) 150 - 400 K/uL  I-stat troponin, ED     Status: None   Collection Time: 09/06/15  5:56 PM  Result Value Ref Range   Troponin i, poc 0.03 0.00 - 0.08 ng/mL   Comment 3          Lipase, blood     Status: None   Collection Time: 09/06/15  6:14 PM  Result Value Ref Range   Lipase 25 22 - 51 U/L  Brain natriuretic peptide     Status: Abnormal   Collection Time: 09/06/15  6:14 PM  Result Value Ref Range   B Natriuretic Peptide 590.2 (H) 0.0 - 100.0 pg/mL  Troponin I     Status: Abnormal   Collection Time: 09/06/15  6:14 PM  Result Value Ref Range   Troponin I 0.04 (H) <0.031 ng/mL  Protime-INR     Status: Abnormal   Collection Time: 09/06/15  6:14 PM  Result Value Ref Range   Prothrombin Time 20.4 (H) 11.6 - 15.2 seconds   INR 1.75 (H) 0.00 - 1.49  Hepatic function panel     Status: Abnormal   Collection Time: 09/06/15  6:14 PM  Result Value Ref Range   Total Protein 6.9 6.5 - 8.1 g/dL   Albumin 3.8 3.5 - 5.0 g/dL   AST 24 15 - 41 U/L   ALT 16 (L) 17 - 63 U/L   Alkaline Phosphatase 52 38 - 126 U/L   Total Bilirubin 1.4 (H) 0.3 - 1.2 mg/dL   Bilirubin, Direct 0.4 0.1 - 0.5 mg/dL   Indirect Bilirubin 1.0 (H) 0.3 - 0.9 mg/dL   Dg Chest 2 View  09/05/2015   CLINICAL DATA:  Atrial  fibrillation.  Congestive heart failure.  EXAM: CHEST  2 VIEW  COMPARISON:  05/10/2015.  FINDINGS: Prior CABG. Cardiomegaly. Cardiac pacer with lead tip in the right atrium and right ventricle. Mild pulmonary interstitial prominence and right pleural effusion. Findings consistent mild congestive heart failure.  IMPRESSION: 1. Cardiac pacer noted in good anatomic position .  Prior CABG. 2. Cardiomegaly pulmonary venous congestion, mild interstitial prominence, and small right pleural effusion consistent with congestive heart failure .   Electronically Signed   By: Marcello Moores  Register   On: 09/05/2015 12:23   Dg Chest Port 1 View  09/06/2015   CLINICAL DATA:  Shortness of breath 2-3 weeks worsening over the past few days. Previous CABG and aortic valve replacement.  EXAM: PORTABLE CHEST - 1 VIEW  COMPARISON:  09/05/2015 and 05/06/2015  FINDINGS: Sternotomy wires and left-sided pacemaker unchanged. Lungs are hypoinflated with subtle stable blunting of the right costophrenic angle likely pleural parenchymal scarring versus small amount right pleural fluid. Minimal prominence of the central pulmonary vasculature likely mild vascular congestion. Mild stable cardiomegaly. Remainder the exam is unchanged.  IMPRESSION: Stable mild cardiomegaly with evidence of minimal vascular congestion. Stable small amount right pleural fluid versus pleural parenchymal scarring.   Electronically Signed   By: Marin Olp M.D.   On: 09/06/2015 18:48   EKG. V paced rhythm QRS > 200 msec  ASSESSMent   Admit under obs for acute on chronic diastolic CHF  likely combination of volume overload, AS and LBBB with Afib Principal Problem:   Acute on chronic diastolic CHF (congestive heart failure) Active Problems:   Hx of CABG - stable    Diabetes- stable, continue home meds, SSI    BPH (benign prostatic hyperplasia)- continue home meds    Chronic kidney disease (CKD), stage III (moderate)- AKI due to worsening CHF    Persistent  atrial fibrillation- on anticoagulation. Continue for now    S/P AVR (aortic valve replacement)- echo showed AVA of 0.82 with DI of 0.26 , has moderate to severe AS,      S/P placement of cardiac pacemaker: sT jude recently mode changed to VVI    PLAN: 1. Admit under obs 2. Diuresis with IV lasix with Cr monitoring. HAS aki ON ckd 3. Continue home meds for HTN, AFib 4. Continue xarelto for now, has bioprosthetic valve ? 5.  interrogation of st jude PPM and mode adjustment ? 6  Heart failure order set with daily wts and I/O  7. Assessment of valve once stable in office  [place]

## 2015-09-07 ENCOUNTER — Encounter: Payer: Medicare Other | Admitting: Internal Medicine

## 2015-09-07 ENCOUNTER — Encounter (HOSPITAL_COMMUNITY): Payer: Self-pay | Admitting: *Deleted

## 2015-09-07 ENCOUNTER — Observation Stay (HOSPITAL_BASED_OUTPATIENT_CLINIC_OR_DEPARTMENT_OTHER): Payer: Medicare Other

## 2015-09-07 ENCOUNTER — Telehealth: Payer: Self-pay | Admitting: Cardiology

## 2015-09-07 DIAGNOSIS — I481 Persistent atrial fibrillation: Secondary | ICD-10-CM | POA: Diagnosis not present

## 2015-09-07 DIAGNOSIS — I509 Heart failure, unspecified: Secondary | ICD-10-CM | POA: Diagnosis not present

## 2015-09-07 DIAGNOSIS — I5033 Acute on chronic diastolic (congestive) heart failure: Secondary | ICD-10-CM | POA: Diagnosis not present

## 2015-09-07 DIAGNOSIS — N183 Chronic kidney disease, stage 3 (moderate): Secondary | ICD-10-CM | POA: Diagnosis not present

## 2015-09-07 DIAGNOSIS — Z954 Presence of other heart-valve replacement: Secondary | ICD-10-CM

## 2015-09-07 LAB — BASIC METABOLIC PANEL
ANION GAP: 10 (ref 5–15)
BUN: 56 mg/dL — ABNORMAL HIGH (ref 6–20)
CALCIUM: 8.3 mg/dL — AB (ref 8.9–10.3)
CO2: 21 mmol/L — AB (ref 22–32)
CREATININE: 3.43 mg/dL — AB (ref 0.61–1.24)
Chloride: 101 mmol/L (ref 101–111)
GFR calc Af Amer: 19 mL/min — ABNORMAL LOW (ref >60)
GFR calc non Af Amer: 16 mL/min — ABNORMAL LOW (ref >60)
GLUCOSE: 129 mg/dL — AB (ref 65–99)
Potassium: 4.2 mmol/L (ref 3.5–5.1)
Sodium: 132 mmol/L — ABNORMAL LOW (ref 135–145)

## 2015-09-07 LAB — URINALYSIS, ROUTINE W REFLEX MICROSCOPIC
Bilirubin Urine: NEGATIVE
Glucose, UA: NEGATIVE mg/dL
Hgb urine dipstick: NEGATIVE
Ketones, ur: NEGATIVE mg/dL
LEUKOCYTES UA: NEGATIVE
NITRITE: NEGATIVE
PH: 5 (ref 5.0–8.0)
Protein, ur: 30 mg/dL — AB
SPECIFIC GRAVITY, URINE: 1.007 (ref 1.005–1.030)
UROBILINOGEN UA: 0.2 mg/dL (ref 0.0–1.0)

## 2015-09-07 LAB — TROPONIN I
TROPONIN I: 0.05 ng/mL — AB (ref <0.031)
TROPONIN I: 0.06 ng/mL — AB (ref <0.031)
Troponin I: 0.04 ng/mL — ABNORMAL HIGH (ref <0.031)

## 2015-09-07 LAB — CBC
HCT: 37.4 % — ABNORMAL LOW (ref 39.0–52.0)
HEMOGLOBIN: 12.1 g/dL — AB (ref 13.0–17.0)
MCH: 25.4 pg — ABNORMAL LOW (ref 26.0–34.0)
MCHC: 32.4 g/dL (ref 30.0–36.0)
MCV: 78.6 fL (ref 78.0–100.0)
Platelets: 84 10*3/uL — ABNORMAL LOW (ref 150–400)
RBC: 4.76 MIL/uL (ref 4.22–5.81)
RDW: 18.1 % — ABNORMAL HIGH (ref 11.5–15.5)
WBC: 6 10*3/uL (ref 4.0–10.5)

## 2015-09-07 LAB — URINE MICROSCOPIC-ADD ON

## 2015-09-07 LAB — GLUCOSE, CAPILLARY
Glucose-Capillary: 158 mg/dL — ABNORMAL HIGH (ref 65–99)
Glucose-Capillary: 169 mg/dL — ABNORMAL HIGH (ref 65–99)
Glucose-Capillary: 137 mg/dL — ABNORMAL HIGH (ref 65–99)
Glucose-Capillary: 140 mg/dL — ABNORMAL HIGH (ref 65–99)

## 2015-09-07 LAB — TSH: TSH: 3.952 u[IU]/mL (ref 0.350–4.500)

## 2015-09-07 MED ORDER — INSULIN GLARGINE 100 UNIT/ML ~~LOC~~ SOLN
3.0000 [IU] | Freq: Every day | SUBCUTANEOUS | Status: DC
  Administered 2015-09-07: 3 [IU] via SUBCUTANEOUS
  Filled 2015-09-07 (×2): qty 0.03

## 2015-09-07 MED ORDER — FUROSEMIDE 40 MG PO TABS
40.0000 mg | ORAL_TABLET | Freq: Every day | ORAL | Status: DC
  Administered 2015-09-08: 40 mg via ORAL
  Filled 2015-09-07: qty 1

## 2015-09-07 MED ORDER — LOPERAMIDE HCL 2 MG PO CAPS
2.0000 mg | ORAL_CAPSULE | ORAL | Status: DC | PRN
  Administered 2015-09-07 – 2015-09-08 (×2): 2 mg via ORAL
  Filled 2015-09-07 (×2): qty 1

## 2015-09-07 MED ORDER — PERFLUTREN LIPID MICROSPHERE
1.0000 mL | INTRAVENOUS | Status: AC | PRN
  Administered 2015-09-07: 3 mL via INTRAVENOUS
  Filled 2015-09-07: qty 10

## 2015-09-07 NOTE — Progress Notes (Signed)
Echocardiogram 2D Echocardiogram has been performed.  Joelene Millin 09/07/2015, 2:44 PM

## 2015-09-07 NOTE — Telephone Encounter (Signed)
Left message to call back  

## 2015-09-07 NOTE — Progress Notes (Signed)
Patient Name: [name] Fiddler, MD Date of Encounter: 09/07/2015     Principal Problem:   Acute on chronic diastolic CHF (congestive heart failure) Active Problems:   Hx of CABG   Diabetes   BPH (benign prostatic hyperplasia)   Chronic kidney disease (CKD), stage III (moderate)   Persistent atrial fibrillation   S/P AVR (aortic valve replacement), Tricuspid Valve repair   Acute on chronic renal failure   S/P placement of cardiac pacemaker   CHF (congestive heart failure)    SUBJECTIVE  Feeling much better, but not quite at baseline in terms of breathing. No Cp  CURRENT MEDS . allopurinol  200 mg Oral Q breakfast  . furosemide  40 mg Intravenous BID  . insulin glargine  2-3 Units Subcutaneous Daily  . metoprolol  100 mg Oral BID  . Rivaroxaban  15 mg Oral QPM  . sodium chloride  3 mL Intravenous Q12H  . terazosin  10 mg Oral Q breakfast  . verapamil  120 mg Oral BID  . zolpidem  5 mg Oral QHS    OBJECTIVE  Filed Vitals:   09/06/15 2230 09/06/15 2343 09/07/15 0300 09/07/15 0754  BP: 130/80 129/69 115/79 124/79  Pulse:  66 67 67  Temp:  97.5 F (36.4 C) 97.5 F (36.4 C) 98.3 F (36.8 C)  TempSrc:  Oral Oral Oral  Resp: 25 18 20 18   Height:  5\' 11"  (1.803 m)    Weight:  251 lb 1.6 oz (113.898 kg) 250 lb 15.1 oz (113.828 kg)   SpO2: 93% 97% 95% 95%    Intake/Output Summary (Last 24 hours) at 09/07/15 0950 Last data filed at 09/07/15 0936  Gross per 24 hour  Intake   1010 ml  Output    527 ml  Net    483 ml   Filed Weights   09/06/15 1856 09/06/15 2343 09/07/15 0300  Weight: 254 lb 5 oz (115.355 kg) 251 lb 1.6 oz (113.898 kg) 250 lb 15.1 oz (113.828 kg)    PHYSICAL EXAM  General: Pleasant, NAD. Neuro: Alert and oriented X 3. Moves all extremities spontaneously. Psych: Normal affect. HEENT:  Normal  Neck: Supple without bruits or JVD. Lungs:  Resp regular and unlabored, CTA. Heart: RRR no s3, s4, or murmurs. Abdomen: Soft, non-tender,  non-distended, BS + x 4.  Extremities: No clubbing, cyanosis or edema. DP/PT/Radials 2+ and equal bilaterally. Trace edema  Accessory Clinical Findings  CBC  Recent Labs  09/06/15 1745 09/07/15 0408  WBC 6.7 6.0  HGB 13.3 12.1*  HCT 40.7 37.4*  MCV 79.0 78.6  PLT 79* 84*   Basic Metabolic Panel  Recent Labs  09/06/15 1745 09/07/15 0408  NA 133* 132*  K 4.8 4.2  CL 103 101  CO2 19* 21*  GLUCOSE 165* 129*  BUN 55* 56*  CREATININE 3.36* 3.43*  CALCIUM 8.6* 8.3*   Liver Function Tests  Recent Labs  09/06/15 1814  AST 24  ALT 16*  ALKPHOS 52  BILITOT 1.4*  PROT 6.9  ALBUMIN 3.8    Recent Labs  09/06/15 1814  LIPASE 25   Cardiac Enzymes  Recent Labs  09/06/15 1814 09/06/15 2329 09/07/15 0408  TROPONINI 0.04* 0.04* 0.05*    Thyroid Function Tests  Recent Labs  09/06/15 2329  TSH 3.952    TELE  V paced  Radiology/Studies  Dg Chest 2 View  09/05/2015   CLINICAL DATA:  Atrial fibrillation.  Congestive heart failure.  EXAM: CHEST  2  VIEW  COMPARISON:  05/10/2015.  FINDINGS: Prior CABG. Cardiomegaly. Cardiac pacer with lead tip in the right atrium and right ventricle. Mild pulmonary interstitial prominence and right pleural effusion. Findings consistent mild congestive heart failure.  IMPRESSION: 1. Cardiac pacer noted in good anatomic position .  Prior CABG. 2. Cardiomegaly pulmonary venous congestion, mild interstitial prominence, and small right pleural effusion consistent with congestive heart failure .   Electronically Signed   By: Marcello Moores  Register   On: 09/05/2015 12:23   Dg Chest Port 1 View  09/06/2015   CLINICAL DATA:  Shortness of breath 2-3 weeks worsening over the past few days. Previous CABG and aortic valve replacement.  EXAM: PORTABLE CHEST - 1 VIEW  COMPARISON:  09/05/2015 and 05/06/2015  FINDINGS: Sternotomy wires and left-sided pacemaker unchanged. Lungs are hypoinflated with subtle stable blunting of the right costophrenic angle  likely pleural parenchymal scarring versus small amount right pleural fluid. Minimal prominence of the central pulmonary vasculature likely mild vascular congestion. Mild stable cardiomegaly. Remainder the exam is unchanged.  IMPRESSION: Stable mild cardiomegaly with evidence of minimal vascular congestion. Stable small amount right pleural fluid versus pleural parenchymal scarring.   Electronically Signed   By: Marin Olp M.D.   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 on 09/06/15 with SOB.   Acute on chronic diastolic CHF likely combination of volume overload, AS and LBBB with Afib -- BNP mildly elevated at 590 and CXR with pulm vasc congestion -- Started on IV lasix and net neg 37 mL but weight down 4 lbs ( 254-->250). Question accuracy -- He is feeling much better. Will convert him back to oral lasix 40mg  qd as he has been having a lot of diarrhea and he appears euvolemic.   Hx of CABG - stable. Troponin slightly elevated. Likely demand ischemia in the setting of Acute CHF.   Diabetes- stable, continue home meds, SSI  BPH - continue home meds  Acute on CKD- AKI due to worsening CHF  Persistent atrial fibrillation- on anticoagulation. Continue for now  Tachybrady syndrome s/p St Jude PPM recently mode changed to VVIR in 07/2015. Interrogation reveals that he is V pacing 60% of time. Acute decompensation likely not related to change from DDDR to VVIR but we will change this back as the patient feels this is playing a role. He had an appointment with Dr.Allred this afternoon that we will reschedule in the next week or so.   Dirrhea- patient requests imodium  Signed, Eileen Stanford PA-C  Pager  [phone]

## 2015-09-07 NOTE — Telephone Encounter (Signed)
-----   Message from [name], MD sent at 09/05/2015  9:57 PM EDT ----- Please report.  The chest xray shows mild CHF.

## 2015-09-07 NOTE — Telephone Encounter (Signed)
Spoke with patient to give Chest xray results and he is currently in the hospital

## 2015-09-07 NOTE — Telephone Encounter (Signed)
New Message  Pt stated he is returning [place] phone call. Please call back and discuss

## 2015-09-07 NOTE — Research (Signed)
[email or web address]  Study Informed Consent   Subject Name: [name], MD  Subject met inclusion and exclusion criteria.  The informed consent form, study requirements and expectations were reviewed with the subject and questions and concerns were addressed prior to the signing of the consent form.  The subject verbalized understanding of the trail requirements.  The subject agreed to participate in the Reds@ [email or web address] trial and signed the informed consent.  The informed consent was obtained prior to performance of any protocol-specific procedures for the subject.  A copy of the signed informed consent was given to the subject and a copy was placed in the subject's medical record.  Sandie Ano 09/07/2015, 16:15

## 2015-09-08 DIAGNOSIS — Z950 Presence of cardiac pacemaker: Secondary | ICD-10-CM

## 2015-09-08 DIAGNOSIS — I481 Persistent atrial fibrillation: Secondary | ICD-10-CM | POA: Diagnosis not present

## 2015-09-08 DIAGNOSIS — Z951 Presence of aortocoronary bypass graft: Secondary | ICD-10-CM | POA: Diagnosis not present

## 2015-09-08 DIAGNOSIS — R0600 Dyspnea, unspecified: Secondary | ICD-10-CM | POA: Insufficient documentation

## 2015-09-08 DIAGNOSIS — N189 Chronic kidney disease, unspecified: Secondary | ICD-10-CM

## 2015-09-08 DIAGNOSIS — I5033 Acute on chronic diastolic (congestive) heart failure: Secondary | ICD-10-CM | POA: Diagnosis not present

## 2015-09-08 DIAGNOSIS — N179 Acute kidney failure, unspecified: Secondary | ICD-10-CM

## 2015-09-08 LAB — CBC WITH DIFFERENTIAL/PLATELET
BASOS ABS: 0 10*3/uL (ref 0.0–0.1)
Basophils Relative: 0 %
EOS PCT: 1 %
Eosinophils Absolute: 0.1 10*3/uL (ref 0.0–0.7)
HEMATOCRIT: 39.8 % (ref 39.0–52.0)
Hemoglobin: 12.7 g/dL — ABNORMAL LOW (ref 13.0–17.0)
LYMPHS ABS: 0.4 10*3/uL — AB (ref 0.7–4.0)
LYMPHS PCT: 9 %
MCH: 25.3 pg — AB (ref 26.0–34.0)
MCHC: 31.9 g/dL (ref 30.0–36.0)
MCV: 79.3 fL (ref 78.0–100.0)
Monocytes Absolute: 0.5 10*3/uL (ref 0.1–1.0)
Monocytes Relative: 9 %
NEUTROS ABS: 4.1 10*3/uL (ref 1.7–7.7)
Neutrophils Relative %: 81 %
Platelets: 85 10*3/uL — ABNORMAL LOW (ref 150–400)
RBC: 5.02 MIL/uL (ref 4.22–5.81)
RDW: 18.3 % — ABNORMAL HIGH (ref 11.5–15.5)
WBC: 5.1 10*3/uL (ref 4.0–10.5)

## 2015-09-08 LAB — GLUCOSE, CAPILLARY
Glucose-Capillary: 101 mg/dL — ABNORMAL HIGH (ref 65–99)
Glucose-Capillary: 130 mg/dL — ABNORMAL HIGH (ref 65–99)

## 2015-09-08 LAB — BASIC METABOLIC PANEL
ANION GAP: 9 (ref 5–15)
BUN: 58 mg/dL — AB (ref 6–20)
CHLORIDE: 101 mmol/L (ref 101–111)
CO2: 24 mmol/L (ref 22–32)
Calcium: 8.7 mg/dL — ABNORMAL LOW (ref 8.9–10.3)
Creatinine, Ser: 3.27 mg/dL — ABNORMAL HIGH (ref 0.61–1.24)
GFR calc Af Amer: 20 mL/min — ABNORMAL LOW (ref >60)
GFR, EST NON AFRICAN AMERICAN: 17 mL/min — AB (ref >60)
GLUCOSE: 138 mg/dL — AB (ref 65–99)
POTASSIUM: 4.2 mmol/L (ref 3.5–5.1)
SODIUM: 134 mmol/L — AB (ref 135–145)

## 2015-09-08 MED ORDER — FUROSEMIDE 40 MG PO TABS: ORAL_TABLET | ORAL | Status: DC

## 2015-09-08 MED ORDER — LOPERAMIDE HCL 2 MG PO CAPS
4.0000 mg | ORAL_CAPSULE | ORAL | Status: DC | PRN
  Administered 2015-09-08: 2 mg via ORAL
  Filled 2015-09-08: qty 2

## 2015-09-08 NOTE — Progress Notes (Signed)
ReDS Vest Discharge Study  Your patient is in the Blinded arm of the Vest at Discharge study.  Your patient has had a ReDS Vest reading and the reading has been transmitted to the cloud.  Your patient is ok for discharge.    Thank You   The research team   

## 2015-09-08 NOTE — Progress Notes (Signed)
CARDIAC REHAB PHASE I   PRE:  Rate/Rhythm: 64 (per dynamap) pt off tele  BP:  Sitting:124/80       SaO2: 95 RA  MODE:  Ambulation: 300 ft   POST:  Rate/Rhythm: 81 (per dynamap) pt off tele  BP:  Sitting: 131/77         SaO2: 95 RA  Pt ambulated 300 ft on RA, steady gait, tolerated fair. Pt c/o DOE, denies CP, dizziness, declined rest stop. Completed CHF education with pt wife at bedside. Completed CHF education with pt wife at bedside. Reviewed daily weights, sodium and fluid restrictions, heart healthy and diabetes diet handouts, exercise guidelines and phase 2 cardiac rehab. Pt and wife verbalized understanding. Pt does not meet criteria for phase 2 cardiac rehab with medicare coverage due to EF, pt not wanting to pay out of pocket for cost. Gave pt brochure for [facility] cardiac rehab and advised pt to contact program if interested in maintenance program. Pt verbalized understanding. Pt to edge of bed after walk, call bell within reach. Pt states he is ready for discharge today.   [phone] Lenna Sciara, RN, BSN 09/08/2015 3:27 PM

## 2015-09-08 NOTE — Care Management Note (Signed)
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 No DME. Has insurance with Medicare and prescription drug coverage with BCBS; pharmacy of choice is [facility]. No needs identified at this time.  Expected Discharge Date:  09/09/15               Expected Discharge Plan:  Home/Self Care  Discharge planning Services  CM Consult  Status of Service:  Completed, signed off  Sherrilyn Rist [phone] 09/08/2015, 10:39 AM

## 2015-09-08 NOTE — Progress Notes (Signed)
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 He is on long-term anticoagulation with Xarelto. He has had multiple cardioversions in the past. His last cardioversion was in January 2015. 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 Echocardiogram in June 2015 demonstrated normal LV function, moderate aortic stenosis. Repeat echocardiogram in 11/2014 demonstrated normal LV function, severe LVH, bioprosthetic AVR with mean gradient of 29 and moderate LAE. He had been admitted in March 2016 after a mechanical fall resulting in fractured seventh rib on the right. He returned to the hospital with acute renal failure and idioventricular rhythm with heart rate as low as 24. Hospitalization was complicated by Mallory-Weiss tear. He improved with hydration. He was recently seen 04/26/15. He was back in atrial flutter with slow ventricular response. He was referred to EP.  Past Medical History  Diagnosis Date  . Coronary heart disease     stent, CABG, RBBB  . Valvular heart disease     aortic stenosis/regurgitation  . Sarcoid 2011    pulmonary and bone marrow  . Hypercalcemia 2011    due to sarcoidiosis  . Bone marrow disease   . Diabetes mellitus   . Hemorrhagic cystitis 2012  . Gout   . HTN (hypertension)   . A-fib     permanent with tachy-brady syndrome  . Myocardial infarction     1995, 2002, 2006  . OSA (obstructive sleep apnea)     use bipap setting of 10 and 12  . Complication of anesthesia 11-16-13    required alot of versed per anesthesia with cataract surgery  . Chronic kidney disease (CKD), stage III (moderate)     lov note dr Koleen Nimrod nephrology 09-17-13 on chart   . Atrial flutter     hx of  . CHF (congestive heart failure)   . Pneumonia 1966, 2011    hx of  . Anxiety   . Osteoarthritis   . DDD (degenerative disc disease)   . Synovial cyst of lumbar spine     l3-l4, l4-l5, injections  . Anemia   . Hepatitis     mono  . Cancer     basal cell  . Right sciatic nerve pain     for block thursday 01-21-2014, injections  . Fungal toenail infection   . Biceps tendon tear     right  . Diabetes   . Shortness of breath dyspnea   . Presence of permanent cardiac pacemaker     Social History   Social History  . Marital Status: Married    Spouse Name: N/A  . Number of Children: 4  . Years of Education: N/A   Occupational History  . retired Administrator, [employer]     due to coronary disease in 2000  . 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 Smoking status: Former Smoker -- 28 years    Types: Pipe, Cigars    Quit date: 12/25/1995  . Smokeless tobacco: Never Used  . Alcohol Use: Yes     Comment: 3-5 beer or wine per week  . Drug Use: No  . Sexual Activity: Not Asked   Other Topics Concern  . None   Social History Narrative  ECHO:Study Conclusions--09/07/15  - Left ventricle: Inferobasal hypokinesis. The cavity size was mildly dilated. Wall thickness was increased in a pattern of mild LVH. The estimated ejection fraction was 54%. - Aortic valve: Bioprosthetic AV with no peri valvular regurgiations and normal systolic gradients. Valve area (VTI): 0.95 cm^2. Valve area (Vmax): 0.99 cm^2. Valve area (Vmean): 1.05 cm^2. - Mitral valve: Calcified annulus. There was mild regurgitation. - Left atrium: The atrium was severely dilated. - Right ventricle: The cavity size was mildly dilated. - Right atrium: The atrium was mildly dilated. - Atrial septum: No defect or patent foramen ovale was identified. - Tricuspid valve: Post tricuspid valve repair. - Pulmonary arteries: PA peak pressure: 36 mm Hg  (S).  ------------------------------------------------------------------- Labs, prior tests, procedures, and surgery: Coronary artery bypass grafting.  ECG.   Abnormal. Echocardiography. M-mode, complete 2D, spectral Doppler, and color Doppler. Birthdate: Patient birthdate: 24-Jan-1941. Age: Patient is 74 yr old. Sex: Gender: male.  BMI: 34.9 kg/m^2. Blood pressure:   120/72 Patient status: Inpatient. Study date: Study date: 09/07/2015. Study time: 01:24 PM. Location: Bedside.  BNP    Component Value Date/Time   BNP 590.2* 09/06/2015 1814    ProBNP No results found for: PROBNP   Education Assessment and Provision:  Detailed education and instructions provided on heart failure disease management including the following:  Signs and symptoms of Heart Failure When to call the physician Importance of daily weights Low sodium diet Fluid restriction Medication management Anticipated future follow-up appointments  Patient education given on each of the above topics.  Patient acknowledges understanding and acceptance of all instructions.  I spoke with Mr. Soules regarding his HF briefly along with cardiac rehab nurse.  He has a scale and weighs daily.  He is able to teach back when to call the physician related to weights.  He describes a diet that is likely high in sodium--saying that he enjoys "[employer]" and they serve "country ham".  We reviewed a low sodium diet and high sodium foods to avoid.  He denies any issues getting or taking prescribed medications.  I have encouraged him to call me with any concerns or questions regarding his HF after discharge.  Education Materials:  "Living Better With Heart Failure" Booklet, Daily Weight Tracker Tool    High Risk Criteria for Readmission and/or Poor Patient Outcomes:   EF <30%- No--54%  2 or more admissions in 6 months-3/6  Difficult social situation- No  Demonstrates medication noncompliance-  yes    Barriers of Care:  Knowledge and compliance  Discharge Planning:   Plans to discharge to home with wife

## 2015-09-08 NOTE — Progress Notes (Signed)
Patient Name: [name] Fiddler, MD Date of Encounter: 09/08/2015   Principal Problem:   Acute on chronic diastolic CHF (congestive heart failure) Active Problems:   Hx of CABG   Diabetes   BPH (benign prostatic hyperplasia)   Chronic kidney disease (CKD), stage III (moderate)   Persistent atrial fibrillation   S/P AVR (aortic valve replacement), Tricuspid Valve repair   Acute on chronic renal failure   S/P placement of cardiac pacemaker   CHF (congestive heart failure)    SUBJECTIVE  Feeling well after adjusting to previous mode of DDDR. No chest pain, sob or palpitations. Wants to go home.   CURRENT MEDS . allopurinol  200 mg Oral Q breakfast  . furosemide  40 mg Oral Daily  . insulin glargine  3 Units Subcutaneous Daily  . metoprolol  100 mg Oral BID  . Rivaroxaban  15 mg Oral QPM  . sodium chloride  3 mL Intravenous Q12H  . terazosin  10 mg Oral Q breakfast  . verapamil  120 mg Oral BID  . zolpidem  5 mg Oral QHS    OBJECTIVE  Filed Vitals:   09/07/15 2120 09/07/15 2231 09/08/15 0639 09/08/15 0943  BP: 120/71  113/57 152/70  Pulse: 62 72 57 72  Temp: 98.2 F (36.8 C)  97.6 F (36.4 C)   TempSrc: Oral  Oral   Resp: 18  29   Height:      Weight:   248 lb 14.4 oz (112.9 kg)   SpO2: 94%  93%     Intake/Output Summary (Last 24 hours) at 09/08/15 1002 Last data filed at 09/08/15 0816  Gross per 24 hour  Intake   1140 ml  Output    950 ml  Net    190 ml   Filed Weights   09/06/15 2343 09/07/15 0300 09/08/15 0639  Weight: 251 lb 1.6 oz (113.898 kg) 250 lb 15.1 oz (113.828 kg) 248 lb 14.4 oz (112.9 kg)    PHYSICAL EXAM  General: Pleasant, NAD. Neuro: Alert and oriented X 3. Moves all extremities spontaneously. Psych: Normal affect. HEENT:  Normal  Neck: Supple without bruits or JVD. Lungs:  Resp regular and unlabored, CTA. Heart: RRR no s3, s4, or murmurs. Abdomen: Soft, non-tender, non-distended, BS + x 4.  Extremities: No clubbing, cyanosis.  DP/PT/Radials 2+ and equal bilaterally.  1+ LE edema L>R.   Accessory Clinical Findings  CBC  Recent Labs  09/06/15 1745 09/07/15 0408  WBC 6.7 6.0  HGB 13.3 12.1*  HCT 40.7 37.4*  MCV 79.0 78.6  PLT 79* 84*   Basic Metabolic Panel  Recent Labs  09/06/15 1745 09/07/15 0408  NA 133* 132*  K 4.8 4.2  CL 103 101  CO2 19* 21*  GLUCOSE 165* 129*  BUN 55* 56*  CREATININE 3.36* 3.43*  CALCIUM 8.6* 8.3*   Liver Function Tests  Recent Labs  09/06/15 1814  AST 24  ALT 16*  ALKPHOS 52  BILITOT 1.4*  PROT 6.9  ALBUMIN 3.8    Recent Labs  09/06/15 1814  LIPASE 25   Cardiac Enzymes  Recent Labs  09/06/15 2329 09/07/15 0408 09/07/15 1041  TROPONINI 0.04* 0.05* 0.06*  Thyroid Function Tests  Recent Labs  09/06/15 2329  TSH 3.952    TELE  Paced rhythm  Radiology/Studies  Dg Chest 2 View  09/05/2015   CLINICAL DATA:  Atrial fibrillation.  Congestive heart failure.  EXAM: CHEST  2 VIEW  COMPARISON:  05/10/2015.  FINDINGS:  Prior CABG. Cardiomegaly. Cardiac pacer with lead tip in the right atrium and right ventricle. Mild pulmonary interstitial prominence and right pleural effusion. Findings consistent mild congestive heart failure.  IMPRESSION: 1. Cardiac pacer noted in good anatomic position .  Prior CABG. 2. Cardiomegaly pulmonary venous congestion, mild interstitial prominence, and small right pleural effusion consistent with congestive heart failure .   Electronically Signed   By: Marcello Moores  Register   On: 09/05/2015 12:23   Dg Chest Port 1 View  09/06/2015   CLINICAL DATA:  Shortness of breath 2-3 weeks worsening over the past few days. Previous CABG and aortic valve replacement.  EXAM: PORTABLE CHEST - 1 VIEW  COMPARISON:  09/05/2015 and 05/06/2015  FINDINGS: Sternotomy wires and left-sided pacemaker unchanged. Lungs are hypoinflated with subtle stable blunting of the right costophrenic angle likely pleural parenchymal scarring versus small amount right  pleural fluid. Minimal prominence of the central pulmonary vasculature likely mild vascular congestion. Mild stable cardiomegaly. Remainder the exam is unchanged.  IMPRESSION: Stable mild cardiomegaly with evidence of minimal vascular congestion. Stable small amount right pleural fluid versus pleural parenchymal scarring.   Electronically Signed   By: Marin Olp M.D.   On: 09/06/2015 18:48   Echo 09/07/15 LV EF: 54%  ------------------------------------------------------------------- Indications:   CHF - 428.0.  ------------------------------------------------------------------- History:  PMH: Tricuspid valve repair. Dyspnea. Atrial fibrillation. Coronary artery disease. Aortic stenosis, post repair. Risk factors: Dyslipidemia.  ------------------------------------------------------------------- Study Conclusions  - Left ventricle: Inferobasal hypokinesis. The cavity size was mildly dilated. Wall thickness was increased in a pattern of mild LVH. The estimated ejection fraction was 54%. - Aortic valve: Bioprosthetic AV with no peri valvular regurgiations and normal systolic gradients. Valve area (VTI): 0.95 cm^2. Valve area (Vmax): 0.99 cm^2. Valve area (Vmean): 1.05 cm^2. - Mitral valve: Calcified annulus. There was mild regurgitation. - Left atrium: The atrium was severely dilated. - Right ventricle: The cavity size was mildly dilated. - Right atrium: The atrium was mildly dilated. - Atrial septum: No defect or patent foramen ovale was identified. - Tricuspid valve: Post tricuspid valve repair. - Pulmonary arteries: PA peak pressure: 36 mm Hg (S).  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   Acute on chronic diastolic CHF likely combination of volume overload, AS, LBBB with Afib and recent change in mode -BNP mildly elevated at 590 and CXR with pulm vasc congestion -Switched to Po lasix 40mg  qd today. Net N/O positive 463. However weight down 6lb. Doubt accurate.  - Echo is reassuring with EF of 54%, mild LVH, normal AV functions, mild dilated Ra, severe dilated LA, mild dilated RV, PA peak pressure of 33mm Hg. - Feeling much better after switched mode to previous setting of DDDR. - continue lasix 40mg  po daily, lopressor 100mg  BID  Hx of CABG - stable. Troponin slightly elevated. Likely demand ischemia in the setting of Acute CHF.   Diabetes- stable, continue home meds, SSI  BPH - continue home meds  Acute on CKD- AKI due to worsening CHF, Cr of 3.43 yesterday with BUN of 56, will get BMET today. If further worsening --> consider renal consult.   Persistent atrial fibrillation- on anticoagulation. Continue for now  Tachybrady syndrome  s/p [employer] PPM recently mode changed to VVIR in 07/2015. Interrogation reveals that he is V pacing 60% of time. Acute decompensation likely not related to change from DDDR to VVIR. Changed to previous mode.  He had an appointment with Dr.Allred this yesterday that we will reschedule in the next week or so when stable for discharge.  Dirrhea- patient requests imodium  Low platelets: platelets of 79 on admission, was 145 05/10/15; 175 05/04/15 147 03/26/15. Will get CBC today. Follow closely.   Signed, Zula Hovsepian PA-C

## 2015-09-08 NOTE — Discharge Summary (Signed)
Discharge Summary   Patient ID: [name] Fiddler, MD,  MRN: [id number], DOB/AGE: 76-Aug-1942 76 y.o.  Admit date: 09/06/2015 Discharge date: 09/08/2015  Primary Care Provider: [name] Primary Cardiologist: Dr. Mare Ferrari  Discharge Diagnoses Principal Problem:   Acute on chronic diastolic CHF (congestive heart failure) Active Problems:   Hx of CABG   Diabetes   BPH (benign prostatic hyperplasia)   Chronic kidney disease (CKD), stage III (moderate)   Persistent atrial fibrillation   S/P AVR (aortic valve replacement), Tricuspid Valve repair   Acute on chronic renal failure   S/P placement of cardiac pacemaker   CHF (congestive heart failure)   Dyspnea   Allergies Allergies  Allergen Reactions  . Actos [Pioglitazone Hydrochloride] Swelling and Other (See Comments)    Severe peripheral edema  . Hydralazine     Severe chills and SOB   . Isosorbide Dinitrate     Severe chills and SOB   . Penicillins Swelling and Other (See Comments)    Serum sickness  . Potassium Iodide Itching and Rash    Severe entire body itching and rash  . Amiodarone Other (See Comments)    diarrhea  . Latex Swelling  . Codeine Other (See Comments)    Makes Pt aggressive  . Depakote [Divalproex Sodium] Diarrhea    severe  . Diltiazem     lethargy and dyspnea  . Januvia [Sitagliptin] Diarrhea and Other (See Comments)    bradycardia  . Loop Diuretics Other (See Comments)    Spike in BUN and creatine levels  . Losartan Other (See Comments)    aphysia  . Nsaids Other (See Comments)    Renal problems  . Onglyza [Saxagliptin] Diarrhea and Other (See Comments)    bradycardia  . Orudis [Ketoprofen] Other (See Comments)    Cannot take due to renal insufficiency  . Pentazocine Lactate Other (See Comments)    Unknown allergic reaction - pt and wife do not recall this  . Rozerem [Ramelteon] Diarrhea    Severe   . Sertraline Hcl Other (See Comments)    Pt and wife do not recall this    . Benazepril Hcl Other (See Comments)    ? Possible lowers plateletes  . Indomethacin Other (See Comments)    Renal problems   . Minocycline Other (See Comments)    Makes dizzy and feel just lousy.  . Poison Ivy Extract [Extract Of Poison Ivy] Rash and Other (See Comments)    blisters  . Shellfish Allergy Rash    Procedures  Echo 09/07/15 LV EF: 54%  ------------------------------------------------------------------- Indications:   CHF - 428.0.  ------------------------------------------------------------------- History:  PMH: Tricuspid valve repair. Dyspnea. Atrial fibrillation. Coronary artery disease. Aortic stenosis, post repair. Risk factors: Dyslipidemia.  ------------------------------------------------------------------- Study Conclusions  - Left ventricle: Inferobasal hypokinesis. The cavity size was mildly dilated. Wall thickness was increased in a pattern of mild LVH. The estimated ejection fraction was 54%. - Aortic valve: Bioprosthetic AV with no peri valvular regurgiations and normal systolic gradients. Valve area (VTI): 0.95 cm^2. Valve area (Vmax): 0.99 cm^2. Valve area (Vmean): 1.05 cm^2. - Mitral valve: Calcified annulus. There was mild regurgitation. - Left atrium: The atrium was severely dilated. - Right ventricle: The cavity size was mildly dilated. - Right atrium: The atrium was mildly dilated. - Atrial septum: No defect or patent foramen ovale was identified. - Tricuspid valve: Post tricuspid valve repair. - Pulmonary arteries: PA peak pressure: 36 mm Hg (S).   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 He is on long-term anticoagulation  with Xarelto. He has had multiple cardioversions in the past. His last cardioversion was in January 2015. 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 Echocardiogram in June 2015 demonstrated normal LV function, moderate aortic stenosis. Repeat echocardiogram in 11/2014 demonstrated normal LV function, severe LVH, bioprosthetic AVR with mean gradient of 29 and moderate LAE. He had been admitted in March 2016 after a mechanical fall resulting in fractured seventh rib on the right. He returned to the hospital with acute renal failure and idioventricular rhythm with heart rate as low as 24. Hospitalization was complicated by Mallory-Weiss tear. He improved with hydration. He was recently seen 04/26/15. He was back in atrial flutter with slow ventricular response. He was referred to EP.  He was seen by Dr. Rayann Heman 05/04/15. The patient was noted to have symptomatic persistent atrial fibrillation and atypical atrial flutter with tachycardia with minimal exertion. Options included pacemaker implantation with medical rate control versus ablation. It was ultimately decided to proceed with pacemaker implantation. If this was unsuccessful, consideration could be given towards AV nodal ablation in the future. He underwent implantation of a dual-chamber [employer] Jude pacemaker on 05/05/15.   He was seen urgently in the office 05/10/15 with evidence of a/c diastolic HF. He was admitted for 48 hours and diuresed with Lasix. Troponins were minimally elevated. Creatinine improved slightly. Echo demonstrated normal LVF with stable gradients across his AVR. Amlodipine was DC'd and Diltiazem was increased.However, he subsequently began having marked fatigue which he attributed to the diltiazem. The diltiazem was reduced and subsequently stopped altogether. He states he was doing well until mid August. In mid-August he saw Dr. Rayann Heman. His pacemaker was reprogrammed to VVIR at that visit. The patient states that he has  not felt as well since that visit. He was begun to gain weight again. His weight is up 7 pounds. He has increased shortness of breath and increased abdominal girth. He is on Lasix 40 mg every other day. If he takes higher doses of Lasix and that, his kidney function worsens quickly. Seen in clinic by Dr Mare Ferrari 09/05/15 and meds were adjusted.   He came to [facility] ED 09/06/15 with c/o worsening shortness of breath and wt gain in last week without any diuresis with lasix. He is NYHA Class III at this point. Ekg LBBB 200 msec wider than his baseline a month ago. BNP mildly elevated at 590 and CXR with pulm vasc congestion. Echo 04/2015 has moderate to severe prosthetic AS mean gradient 30 mmHg, AVA 0.82 , DI 0.26   Hospital Course  09/06/15: He was admitted for evening for observation and diuresed with IV lasix.   09/07/15: Felt better with improvement in breathing. He reported non-bloody diarrhea.Device interrogation revealed near chronic atrial fibrillation/atrial flutter with approximately 65% ventricular pacing. He attributed his symptoms to switching modes from DDDR to VVIR. He was switched back on DDDR. Due to diarrhea PM dose of lasix switched to po lasix. Troponin was minimally elevated in a setting of acute CHF. Echo was reassuring with EF of 54%, mild LVH, normal AV functions, mild dilated Ra, severe dilated LA, mild dilated RV, PA peak pressure of 66m Hg.  09/08/15: Felt much better  after adjusted to previous mode of DDDR. He appeared mildly volume overloaded on exam. He was anxious to go home. Weight down to 248 lb from 253 lb on admission. Creatine slightly improved to 3.3, though still not at baseline.  She has been seen by Dr. Oval Linsey today and deemed ready for discharge home. He was discharged on lasix po 50m Tuesday, Thursday, Sat and Sun. Patient will limit fluid intake to 2L (was previously taking in 3L). He will weigh himself daily and take an extras lasix (467m if weight is up by  2lb in one day or 5 lb in one week. He will follow up with his PCP on Monday 09/12/15 for a BMP check and cardiology clinic 09/19/15.  Per wife requested, he was evaluated for cardiac rehab. Pt does not meet criteria for phase 2 cardiac rehab with medicare coverage due to EF, pt not wanting to pay out of pocket for cost. [facility] and advised pt to contact program if interested in maintenance program.  He has normal LVEF, so likely would not qualify for upgrade to BiV pacing based on BLOCK HF trial. This can be discussed with Dr. AlRayann Hemans an outpatient.  Discharge Vitals Blood pressure 113/65, pulse 65, temperature 97.8 F (36.6 C), temperature source Oral, resp. rate 18, height 5' 11"  (1.803 m), weight 248 lb 14.4 oz (112.9 kg), SpO2 95 %.  Filed Weights   09/06/15 2343 09/07/15 0300 09/08/15 0639  Weight: 251 lb 1.6 oz (113.898 kg) 250 lb 15.1 oz (113.828 kg) 248 lb 14.4 oz (112.9 kg)    Labs  CBC  Recent Labs  09/07/15 0408 09/08/15 1100  WBC 6.0 5.1  NEUTROABS  --  4.1  HGB 12.1* 12.7*  HCT 37.4* 39.8  MCV 78.6 79.3  PLT 84* 85*   Basic Metabolic Panel  Recent Labs  09/07/15 0408 09/08/15 1100  NA 132* 134*  K 4.2 4.2  CL 101 101  CO2 21* 24  GLUCOSE 129* 138*  BUN 56* 58*  CREATININE 3.43* 3.27*  CALCIUM 8.3* 8.7*   Liver Function Tests  Recent Labs  09/06/15 1814  AST 24  ALT 16*  ALKPHOS 52  BILITOT 1.4*  PROT 6.9  ALBUMIN 3.8    Recent Labs  09/06/15 1814  LIPASE 25   Cardiac Enzymes  Recent Labs  09/06/15 2329 09/07/15 0408 09/07/15 1041  TROPONINI 0.04* 0.05* 0.06*   Thyroid Function Tests  Recent Labs  09/06/15 2329  TSH 3.952    Disposition  Pt is being discharged home today in good condition.  Follow-up Plans & Appointments  Follow-up Information    Follow up with SILyda JesterPA-C On 09/19/2015.   Specialties:  Cardiology, Radiology   Why:  @10 :30 for cardiology follow  up   Contact information:   [phone]        Discharge Instructions    Diet - low sodium heart healthy    Complete by:  As directed      Discharge instructions    Complete by:  As directed   Patient will limit fluid intake to 2L (was previously taking in 3L). He will weigh himself daily and take an extra lasix (4056m if weight is up by 2lb in one day or 5 lb in one week.   .*Weigh yourself on the same scale at same time of day and keep a log. *Report weight  gain of > 2 lbs in 1 day or 5 lbs over the course of a week and/or symptoms of excess fluid (shortness of breath, difficulty lying flat, swelling, poor appetite, abdominal fullness/bloating, etc) to your doctor immediately. *Avoid foods that are high in sodium (processed, pre-packaged/canned goods, fast foods, etc). *Please attend all scheduled and reccommended follow up appointments     Increase activity slowly    Complete by:  As directed            F/u Labs/Studies: BMET with PCP - 09/12/15  Discharge Medications    Medication List    TAKE these medications        acetaminophen 500 MG tablet  Commonly known as:  TYLENOL  Take 1,000 mg by mouth 2 (two) times daily as needed (pain).     allopurinol 100 MG tablet  Commonly known as:  ZYLOPRIM  Take 200 mg by mouth daily with breakfast.     COCONUT OIL PO  Apply 1 application topically daily as needed (dry skin).     diazepam 10 MG tablet  Commonly known as:  VALIUM  Take 1 tablet by mouth daily as needed for anxiety or sleep. Ankle and foot cramps     diphenoxylate-atropine 2.5-0.025 MG per tablet  Commonly known as:  LOMOTIL  Take 1-2 tablets by mouth daily as needed. stomach     furosemide 40 MG tablet  Commonly known as:  LASIX  Take 1 tablet (101m) by mouth every Tuesday, Thursday, Sat and Sun.Take an extras lasix (479m if weight is up by 2lb in one day or 5 lb in one week.     insulin glargine  100 unit/mL Sopn  Commonly known as:  LANTUS  Inject 2-3 Units into the skin daily after breakfast. CBG 120-140 2 units, 140-160 3 units     Magnesium 250 MG Tabs  Take 125 mg by mouth 2 (two) times daily.     metoprolol 100 MG tablet  Commonly known as:  LOPRESSOR  Take 100 mg by mouth 2 (two) times daily.     terazosin 10 MG capsule  Commonly known as:  HYTRIN  Take 10 mg by mouth daily with breakfast.     TERBINAFINE EX  Apply 1 drop topically at bedtime. Place 1 drop on each toenail.     Testosterone Cypionate 200 MG/ML Kit  Inject 1 mL into the muscle every 14 (fourteen) days.     verapamil 120 MG CR tablet  Commonly known as:  CALAN-SR  Take 1 tablet (120 mg total) by mouth 2 (two) times daily.     XARELTO 15 MG Tabs tablet  Generic drug:  Rivaroxaban  Take 15 mg by mouth every evening.     zolpidem 10 MG tablet  Commonly known as:  AMBIEN  Take 10 mg by mouth at bedtime.        Duration of Discharge Encounter   Greater than 30 minutes including physician time.  Signed, Kynzlee Hucker PA-C 09/08/2015, 4:18 PM

## 2015-09-08 NOTE — Progress Notes (Signed)
IV removed per discharge order. Discharge instructions given and explained with teach back. Discharged to wife's care via wheelchair with NT present.

## 2015-09-13 ENCOUNTER — Telehealth (HOSPITAL_COMMUNITY): Payer: Self-pay | Admitting: Surgery

## 2015-09-13 NOTE — Telephone Encounter (Signed)
Heart Failure Nurse Navigator Post discharge Telephone Call  I called to check on [name] after his recent hospitalization.  He tells me that things have been "going well".  He is still "trying to recover from being in the hospital".  He says that his weight today is 245 lbs versus 248.9lbs on discharge day 09/08/15.  He also says that he and his wife have been "watching salt and sodium".  He denies any issues with getting or taking prescribed medications.  He has an appt with Dr. Rayann Heman on Monday the 26th and I have encouraged him to call [facility] with any escalation of HF symptoms and/or weight gains--he says he "certainly will".  I also encouraged either he or his wife to call me with any questions or concerns related to his HF.

## 2015-09-14 ENCOUNTER — Encounter: Payer: Self-pay | Admitting: *Deleted

## 2015-09-19 ENCOUNTER — Other Ambulatory Visit (HOSPITAL_COMMUNITY): Payer: Self-pay | Admitting: Emergency Medicine

## 2015-09-19 ENCOUNTER — Ambulatory Visit (INDEPENDENT_AMBULATORY_CARE_PROVIDER_SITE_OTHER): Payer: Medicare Other | Admitting: Internal Medicine

## 2015-09-19 ENCOUNTER — Encounter: Payer: Self-pay | Admitting: Internal Medicine

## 2015-09-19 ENCOUNTER — Encounter: Payer: Medicare Other | Admitting: Cardiology

## 2015-09-19 VITALS — BP 120/84 | HR 81 | Ht 71.0 in | Wt 252.8 lb

## 2015-09-19 DIAGNOSIS — I484 Atypical atrial flutter: Secondary | ICD-10-CM | POA: Diagnosis not present

## 2015-09-19 DIAGNOSIS — R001 Bradycardia, unspecified: Secondary | ICD-10-CM | POA: Diagnosis not present

## 2015-09-19 DIAGNOSIS — I495 Sick sinus syndrome: Secondary | ICD-10-CM

## 2015-09-19 DIAGNOSIS — I259 Chronic ischemic heart disease, unspecified: Secondary | ICD-10-CM | POA: Diagnosis not present

## 2015-09-19 DIAGNOSIS — I5189 Other ill-defined heart diseases: Secondary | ICD-10-CM | POA: Insufficient documentation

## 2015-09-19 DIAGNOSIS — I519 Heart disease, unspecified: Secondary | ICD-10-CM

## 2015-09-19 DIAGNOSIS — R0600 Dyspnea, unspecified: Secondary | ICD-10-CM

## 2015-09-19 LAB — CUP PACEART INCLINIC DEVICE CHECK
Battery Remaining Longevity: 122.4 mo
Battery Voltage: 3.01 V
Lead Channel Impedance Value: 450 Ohm
Lead Channel Pacing Threshold Pulse Width: 0.5 ms
Lead Channel Setting Pacing Amplitude: 2 V
Lead Channel Setting Pacing Amplitude: 2.5 V
Lead Channel Setting Pacing Pulse Width: 0.5 ms
Lead Channel Setting Sensing Sensitivity: 2 mV
MDC IDC MSMT LEADCHNL RA SENSING INTR AMPL: 3.2 mV
MDC IDC MSMT LEADCHNL RV IMPEDANCE VALUE: 662.5 Ohm
MDC IDC MSMT LEADCHNL RV PACING THRESHOLD AMPLITUDE: 1 V
MDC IDC MSMT LEADCHNL RV PACING THRESHOLD AMPLITUDE: 1 V
MDC IDC MSMT LEADCHNL RV PACING THRESHOLD PULSEWIDTH: 0.5 ms
MDC IDC MSMT LEADCHNL RV SENSING INTR AMPL: 6.8 mV
MDC IDC PG SERIAL: 7759894
MDC IDC SESS DTM: 20160926124916
MDC IDC STAT BRADY RA PERCENT PACED: 0 %
MDC IDC STAT BRADY RV PERCENT PACED: 82 %

## 2015-09-19 NOTE — Progress Notes (Signed)
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 He recently was hospitalized with CHF.  He was diuresed and is now back to baseline.  Today, he denies symptoms of palpitations, chest pain, shortness of breath,  lower extremity edema, dizziness, presyncope, or syncope.  The patient is otherwise without complaint today.   Past Medical History  Diagnosis Date  . Coronary heart disease     stent, CABG, RBBB  . Valvular heart disease     aortic stenosis/regurgitation  . Sarcoid 2011    pulmonary and bone marrow  . Hypercalcemia 2011    due to sarcoidiosis  . Bone marrow disease   . Diabetes mellitus   . Hemorrhagic cystitis 2012  . Gout   . HTN (hypertension)   . A-fib     permanent with tachy-brady syndrome  . Myocardial infarction     1995, 2002, 2006  . OSA (obstructive sleep apnea)     use bipap setting of 10 and 12  . Complication of anesthesia 11-16-13    required alot of versed per anesthesia with cataract surgery  . Chronic kidney disease (CKD), stage III (moderate)     lov note dr Koleen Nimrod nephrology 09-17-13 on chart  . Atrial flutter     hx of  . CHF (congestive heart failure)   . Pneumonia 1966, 2011    hx of  . Anxiety   . Osteoarthritis   . DDD (degenerative disc disease)   . Synovial cyst of lumbar spine     l3-l4, l4-l5, injections  . Anemia   . Hepatitis     mono  . Cancer     basal cell  . Right sciatic nerve pain     for block thursday 01-21-2014, injections  . Fungal toenail infection   . Biceps tendon tear     right  . Diabetes   . Shortness of breath dyspnea   . Presence of permanent cardiac pacemaker    Past Surgical History  Procedure Laterality Date  . Cataract surgery Right 2007  . Transurethral resection of prostate  oct. 2012    TURP  . Vasectomy  1977  . Redo median sternotomy, extracorporeal cirulation, avr, tricuspid  valve repair  06/13/2011    AVR(23-mm Edwards pericardial Magna-Ease valve./ TVrepair (34-mm Edwards MC3 annuloplasty ring  . Coronary artery bypass graft  2006    x 5  . Cataract surgery Left 11-16-13  . Cardioversion N/A 12/30/2013    Procedure: CARDIOVERSION;  Surgeon: [name], MD;  Location: [facility] ENDOSCOPY;  Service: Cardiovascular;  Laterality: N/A;  10:49 cardioversion at 120 joules, then 150 joules, to SB  used  Lido 33m,  Propofol 160 mcg  . Colonoscopy N/A 01/29/2014    Procedure: COLONOSCOPY;  Surgeon: JWinfield Cunas, MD;  [facility];  Service: Endoscopy;  Laterality: N/A;  amanda//ja  . Cardioversion  2003, 2006, 2012, 2013  . Portacath placement    . Portacath removed    . Basal cell carcinoma excision Right 2015    ear  . Total knee arthroplasty Right 05/24/2014    Procedure: RIGHT TOTAL KNEE ARTHROPLASTY;  Surgeon: FGearlean Alf MD;  Location: [facility] ORS;  Service: Orthopedics;  Laterality: Right;  . Ep implantable device N/A 05/05/2015    Procedure: Pacemaker Implant;  Surgeon: JThompson Grayer MD;  [facility];  Service: Cardiovascular;  Laterality:  N/A;  . Insert / replace / remove pacemaker  05/05/2015    ROS- all systems are reviewed and negative except as per HPI above  Current Outpatient Prescriptions  Medication Sig Dispense Refill  . acetaminophen (TYLENOL) 500 MG tablet Take 1,000 mg by mouth 2 (two) times daily as needed (pain).     [name] allopurinol (ZYLOPRIM) 100 MG tablet Take 200 mg by mouth daily with breakfast.     . COCONUT OIL PO Apply 1 application topically daily as needed (dry skin).    [name] diazepam (VALIUM) 10 MG tablet Take 1 tablet by mouth daily as needed for anxiety or sleep. Ankle and foot cramps  0  . diphenoxylate-atropine (LOMOTIL) 2.5-0.025 MG per tablet Take 1-2 tablets by mouth daily as needed. stomach  1  . furosemide (LASIX) 40 MG tablet Take 1 tablet (50m) by mouth every Tuesday, Thursday, Sat and Sun.Take an  extras lasix (430m if weight is up by 2lb in one day or 5 lb in one week. 30 tablet 2  . insulin glargine (LANTUS) 100 unit/mL SOPN Inject 2-3 Units into the skin daily after breakfast. CBG 120-140 2 units, 140-160 3 units    . Magnesium 250 MG TABS Take 125 mg by mouth 2 (two) times daily.     . metoprolol (LOPRESSOR) 100 MG tablet Take 100 mg by mouth 2 (two) times daily.    . Marland Kitchenerazosin (HYTRIN) 10 MG capsule Take 10 mg by mouth daily with breakfast.    . TERBINAFINE EX Apply 1 drop topically at bedtime. Place 1 drop on each toenail.    . Testosterone Cypionate 200 MG/ML KIT Inject 1 mL into the muscle every 14 (fourteen) days.    . Alveda Reasons5 MG TABS tablet Take 15 mg by mouth every evening.  0  . zolpidem (AMBIEN) 10 MG tablet Take 10 mg by mouth at bedtime.      No current facility-administered medications for this visit.    Physical Exam: Filed Vitals:   09/19/15 1147  BP: 120/84  Pulse: 81  Height: 5' 11"  (1.803 m)  Weight: 252 lb 12.8 oz (114.669 kg)    GEN- The patient is well appearing, alert and oriented x 3 today.   Head- normocephalic, atraumatic Eyes-  Sclera clear, conjunctiva pink Ears- hearing intact Oropharynx- clear Lungs- Clear to ausculation bilaterally, normal work of breathing Chest- pacemaker pocket is well healed Heart- Regular rate and rhythm (paced) GI- soft, NT, ND, + BS Extremities- no clubbing, cyanosis, or edema  Pacemaker interrogation- reviewed in detail today,  See PACEART report  Assessment and Plan:  1. Tachy/brady syndrome Normal pacemaker function See PaClaudia Desanctisrt report The patient is convinced that prior VVIR programming caused his decompensation though I think that this is very unlikely.  I have therefore returned him to DDDR pacing (though he is effectively mode switched all of the time)  2. Permanent afib Well rate controlled Continue life long anticoagulation  3. Chronic diastolic dysfunction euvolemic today Sodium restriction  is advised Will need close follow-up with general cardiology going forward  Merlin Return to see Dr BrMare Ferraris scheduled Return to see EP NP in 1 year  JaThompson GrayerD, FASum[facility]/26/2016 10:17 PM

## 2015-09-19 NOTE — Patient Instructions (Addendum)
Medication Instructions: - no changes  Labwork: - none  Procedures/Testing: - none  Follow-Up: - Remote monitoring is used to monitor your Pacemaker of ICD from home. This monitoring reduces the number of office visits required to check your device to one time per year. It allows [place] to keep an eye on the functioning of your device to ensure it is working properly. You are scheduled for a device check from home on 12/20/15. You may send your transmission at any time that day. If you have a wireless device, the transmission will be sent automatically. After your physician reviews your transmission, you will receive a postcard with your next transmission date.  - Your physician wants you to follow-up in: 1 year with Dr. Rayann Heman. You will receive a reminder letter in the mail two months in advance. If you don't receive a letter, please call our office to schedule the follow-up appointment.  Any Additional Special Instructions Will Be Listed Below (If Applicable).

## 2015-10-04 ENCOUNTER — Ambulatory Visit (HOSPITAL_COMMUNITY)
Admission: RE | Admit: 2015-10-04 | Discharge: 2015-10-04 | Disposition: A | Discharge status: Home / Self Care | Payer: Medicare Other | Source: Ambulatory Visit | Attending: Emergency Medicine | Admitting: Emergency Medicine

## 2015-10-04 ENCOUNTER — Other Ambulatory Visit (HOSPITAL_COMMUNITY): Payer: Self-pay | Admitting: Emergency Medicine

## 2015-10-04 DIAGNOSIS — R0600 Dyspnea, unspecified: Secondary | ICD-10-CM

## 2015-10-17 ENCOUNTER — Encounter: Payer: Self-pay | Admitting: Cardiology

## 2015-10-23 ENCOUNTER — Other Ambulatory Visit: Payer: Self-pay | Admitting: Cardiology

## 2015-11-08 ENCOUNTER — Ambulatory Visit: Payer: Medicare Other | Admitting: Cardiology

## 2015-11-28 ENCOUNTER — Ambulatory Visit: Payer: Medicare Other | Admitting: Cardiology

## 2015-12-01 NOTE — Progress Notes (Signed)
Cardiology Office Note   Date:  12/02/2015   ID:  [name] Fiddler, MD, DOB [birth year], MRN [id number]   Patient Care Team: Briscoe Deutscher, MD as PCP - General (Family Medicine) Gaye Pollack, MD (Cardiothoracic Surgery) [name], MD as Consulting Physician (Cardiology) Thompson Grayer, MD as Consulting Physician (Clinical Cardiac Electrophysiology) Donato Heinz, MD as Consulting Physician (Nephrology) Laurence Spates, MD as Consulting Physician (Gastroenterology)    Chief Complaint  Patient presents with  . Follow-up  . Congestive Heart Failure  . 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 He is on long-term anticoagulation with Xarelto. He is s/p multiple DCCVs in the past (last January 2015). He is intolerant to amiodarone due to diarrhea.   Admitted in 3/16 after a mechanical fall resulting in fractured R 7th. He returned to the hospital with ARF and idioventricular rhythm with heart rate as low as 24. Hospitalization was complicated by Mallory-Weiss tear.   In FU 5/16, he was back in AFlutter with SVR and referred to EP. He saw Dr. Rayann Heman and was noted to have symptomatic persistent AFib and atypical AFlutter and tachycardia with minimal exertion. Options included pacemaker implantation with medical rate control versus ablation. It was ultimately decided to proceed with pacemaker implantation. If this was unsuccessful, consideration could be given towards AV nodal ablation in the future. He underwent implantation of a dual-chamber [employer]. Jude pacemaker on 05/05/15.   Admitted from the office in 5/16 with evidence of a/c diastolic HF. Echo demonstrated normal LVF with stable gradients across his AVR. Amlodipine was DC'd and  Diltiazem was increased.   Admitted 9/16 with acute on chronic diastolic CHF. Device was reprogrammed from VVIR to DDDR.    Last seen by Dr. Thompson Grayer 09/19/15.  Device was left in DDDR mode (Dr. Rayann Heman noted that VVIR mode was unlikely to cause decompensation).    He was seen by Dr. Arty Baumgartner 2 days ago and was noted to be volume overloaded.  He has a long hx of diarrhea.  He is being referred to Dr. Oletta Lamas (GI).  His abdomen was noted to be distended and Dr. Loletha Grayer recommended considering paracentesis.  [name] is chronically short of breath.  He is NYHA 2b-3.  Denies significant worsening. Denies orthopnea, PND. LE edema unchanged.  He denies chest pain, syncope.  He has noted worsening fatigue and decreased exercise tolerance.    No cough or wheezing.     Studies/Reports Reviewed Today:  Echo 09/07/15 Inferobasal HK, mild LVH, EF 54%, bioprosthetic AVR (mean gradient 7 mmHg), MAC, mild MR, severe LAE, mild RVE, mild RAE, PASP 36 mmHg  VQ Scan 05/10/15 IMPRESSION: Normal examination. No evidence of pulmonary embolism.  Echo 05/11/15 Mild LVH, EF 55-60%, normal wall motion, bioprosthetic AVR okay with mean gradient 27 mm Hg and peak Peak gradient 58 mm Hg, MAC, mild MR, moderate LAE, mild RAE, moderate TR   Pacer Implant 05/05/15 1. 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 No early apparent complications.   Echo 12/13/14 Severe LVH, EF 55-60%, bioprosthetic AVR okay with mean gradient 29 mmHg, MAC, moderate LAE  Carotid US 06/07/14 Bilateral ICA 1-39%  LHC 05/30/11 LM: Clot  prior to takeoff of LAD and circumflex LAD: Occluded LCx: Proximal 70% RCA: Not injected; known to be occluded SVG-DX: Patent SVG-OM1/OM2: Patent SVG-PDA: Patent L-LAD: Patent    Past Medical History  Diagnosis Date  . Coronary heart disease     stent, CABG, RBBB  . Valvular heart  disease     aortic stenosis/regurgitation  . Sarcoid ([place]) 2011    pulmonary and bone marrow  . Hypercalcemia 2011    due to sarcoidiosis  . Bone marrow disease   . Diabetes mellitus   . Hemorrhagic cystitis 2012  . Gout   . HTN (hypertension)   . A-fib ([place])     permanent with tachy-brady syndrome  . Myocardial infarction [facility])     1995, 2002, 2006  . OSA (obstructive sleep apnea)     use bipap setting of 10 and 12  . Complication of anesthesia 11-16-13    required alot of versed per anesthesia with cataract surgery  . Chronic kidney disease (CKD), stage III (moderate)     lov note dr Koleen Nimrod nephrology 09-17-13 on chart  . Atrial flutter ([facility])     hx of  . CHF (congestive heart failure) ([place])   . Pneumonia 1966, 2011    hx of  . Anxiety   . Osteoarthritis   . DDD (degenerative disc disease)   . Synovial cyst of lumbar spine     l3-l4, l4-l5, injections  . Anemia   . Hepatitis     mono  . Cancer ([place])     basal cell  . Right sciatic nerve pain     for block thursday 01-21-2014, injections  . Fungal toenail infection   . Biceps tendon tear     right  . Diabetes ([place])   . Shortness of breath dyspnea   . Presence of permanent cardiac pacemaker     Past Surgical History  Procedure Laterality Date  . Cataract surgery Right 2007  . Transurethral resection of prostate  oct. 2012    TURP  . Vasectomy  1977  . Redo median sternotomy, extracorporeal cirulation, avr, tricuspid valve repair  06/13/2011    AVR(23-mm Edwards pericardial Magna-Ease valve./ TVrepair (34-mm Edwards MC3 annuloplasty ring  . Coronary artery bypass graft  2006    x 5  . Cataract surgery Left 11-16-13  . Cardioversion N/A 12/30/2013    Procedure: CARDIOVERSION;  Surgeon: [name], MD;  Location: [facility] ENDOSCOPY;  Service: Cardiovascular;  Laterality: N/A;  10:49 cardioversion at 120 joules, then 150 joules, to SB  used  Lido 38m,  Propofol 160 mcg  . Colonoscopy N/A 01/29/2014    Procedure:  COLONOSCOPY;  Surgeon: JWinfield Cunas, MD;  [facility];  Service: Endoscopy;  Laterality: N/A;  amanda//ja  . Cardioversion  2003, 2006, 2012, 2013  . Portacath placement    . Portacath removed    . Basal cell carcinoma excision Right 2015    ear  . Total knee arthroplasty Right 05/24/2014    Procedure: RIGHT TOTAL KNEE ARTHROPLASTY;  Surgeon: FGearlean Alf MD;  Location: [facility] ORS;  Service: Orthopedics;  Laterality: Right;  . Ep implantable device N/A 05/05/2015    Procedure: Pacemaker Implant;  Surgeon: JThompson Grayer MD;  [facility];  Service: Cardiovascular;  Laterality: N/A;  . Insert / replace / remove pacemaker  05/05/2015     Current Outpatient Prescriptions  Medication Sig Dispense Refill  . acetaminophen (TYLENOL) 500 MG tablet Take 1,000 mg by mouth  2 (two) times daily as needed (pain).     [name] allopurinol (ZYLOPRIM) 100 MG tablet Take 200 mg by mouth daily with breakfast.     . COCONUT OIL PO Apply 1 application topically daily as needed (dry skin).    [name] diazepam (VALIUM) 10 MG tablet Take 1 tablet by mouth daily as needed for anxiety or sleep. Ankle and foot cramps  0  . diphenoxylate-atropine (LOMOTIL) 2.5-0.025 MG per tablet Take 1-2 tablets by mouth daily as needed. stomach  1  . furosemide (LASIX) 40 MG tablet Take 1 tablet (47m) by mouth every Tuesday, Thursday, Sat and Sun.Take an extras lasix (449m if weight is up by 2lb in one day or 5 lb in one week. 30 tablet 2  . insulin glargine (LANTUS) 100 unit/mL SOPN Inject 2-3 Units into the skin daily after breakfast. CBG 120-140 2 units, 140-160 3 units    . Magnesium 250 MG TABS Take 125 mg by mouth daily.     . metoprolol (LOPRESSOR) 100 MG tablet Take 50 mg by mouth 2 (two) times daily.     . Marland Kitchenpironolactone (ALDACTONE) 25 MG tablet Take 25 mg by mouth daily.  0  . terazosin (HYTRIN) 10 MG capsule Take 10 mg by mouth daily with breakfast.    . TERBINAFINE EX Apply 1 drop topically at  bedtime. Place 1 drop on each toenail.    . Marland Kitchenestosterone cypionate (DEPOTESTOSTERONE CYPIONATE) 200 MG/ML injection Inject into the muscle. TWICE  A MONTH  1  . Testosterone Cypionate 200 MG/ML KIT Inject 1 mL into the muscle every 14 (fourteen) days.    . Alveda Reasons5 MG TABS tablet Take 15 mg by mouth every evening.  0  . zolpidem (AMBIEN) 10 MG tablet Take 10 mg by mouth at bedtime.      No current facility-administered medications for this visit.    Allergies:   Actos; Hydralazine; Isosorbide dinitrate; Penicillins; Potassium iodide; Amiodarone; Latex; Codeine; Depakote; Diltiazem; Januvia; Loop diuretics; Losartan; Nsaids; Onglyza; Orudis; Pentazocine lactate; Rozerem; Sertraline hcl; Verapamil; Benazepril hcl; Indomethacin; Minocycline; Poison ivy extract; and Shellfish allergy    Social History:   Social History   Social History  . Marital Status: Married    Spouse Name: N/A  . Number of Children: 4  . Years of Education: N/A   Occupational History  . retired inAdministrator, [employer] Service   due to coronary disease in 2000  . 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 Smoking status: Former Smoker -- 28 years    Types: Pipe, Cigars    Quit date: 12/25/1995  . Smokeless tobacco: Never Used  . Alcohol Use: Yes     Comment: 3-5 beer or wine per week  . Drug Use: No  . Sexual Activity: Not Asked   Other Topics Concern  . None   Social History Narrative     Family History:   Family History  Problem Relation Age of Onset  . Heart attack Father   . Stroke Father   . Allergies    . Asthma    . Heart disease    . Cancer    . Stroke Mother       ROS:   Please see the history of present illness.   Review of Systems  Constitution: Positive for chills, decreased appetite, malaise/fatigue and weight gain.  Cardiovascular: Positive for dyspnea on exertion and irregular heartbeat.  Respiratory: Positive for wheezing.   Gastrointestinal:  Positive for  diarrhea.      PHYSICAL EXAM: VS:  BP 150/124 mmHg  Pulse 102  Ht $R'5\' 11"'Uk$  (1.803 m)  Wt 260 lb 12.8 oz (118.298 kg)  BMI 36.39 kg/m2    Wt Readings from Last 3 Encounters:  12/02/15 260 lb 12.8 oz (118.298 kg)  09/19/15 252 lb 12.8 oz (114.669 kg)  09/08/15 248 lb 14.4 oz (112.9 kg)     GEN: Well nourished, well developed, in no acute distress HEENT: + periorbital edema Neck: JVP 10 cm,no masses Cardiac:  Normal S1/S2, irregularly irregular rhythm; no murmur ,  no rubs or gallops, 1+ bilateral LE edema   Respiratory:  clear to auscultation bilaterally, no wheezing, rhonchi or rales. GI:  Massively distended, no hepatomegaly MS: no deformity or atrophy, + presacral edema.  Skin: warm and dry  Neuro:  CNs II-XII intact, Strength and sensation are intact Psych: Normal affect   EKG:  EKG is ordered today.  It demonstrates:   Underlying AFib/Flutter, HR 102, LAD, IVCD, RBBB, no sig change from prior tracing   Recent Labs: 09/06/2015: ALT 16*; B Natriuretic Peptide 590.2*; TSH 3.952 09/08/2015: BUN 58*; Creatinine, Ser 3.27*; Hemoglobin 12.7*; Platelets 85*; Potassium 4.2; Sodium 134*    Lipid Panel    Component Value Date/Time   CHOL 106 05/28/2014 0140   TRIG 169* 05/28/2014 0140   HDL 14* 05/28/2014 0140   CHOLHDL 7.6 05/28/2014 0140   VLDL 34 05/28/2014 0140   LDLCALC 58 05/28/2014 0140      ASSESSMENT AND PLAN:  1. Acute on Chronic diastolic CHF (congestive heart failure):  He is volume overloaded with evidence of anasarca (ascites).  Weight is up 12 lbs by our scales.  He only take Lasix 40 mg 4 x a week.  He was placed on Spironolactone 2 days ago. He has had minimal improvement.  Recent Creatinine 2.77 on 12/6.  I have recommended admission to the hospital for IV diuresis.  He was also seen by Dr. Dorris Carnes.  -  Admit to [facility]  -  Start Lasix 60 mg IV daily tonight.  May need to adjust based upon diuresis and SCr.  -  Follow renal function closely  -  Hold  Spironolactone.  Consider resuming after effective diuresis.    -  Consider HF Team consult    2. Coronary artery disease: s/p CABG in 2006. Continue beta blocker. He is not on ASA as he is on Xarelto.   3. VALVULAR HEART DISEASE s/p Bioprosthetic AVR and TV Repair:  Stable AVR on recent echo. Continue SBE prophylaxis.   4. Sick sinus syndrome S/P placement of cardiac pacemaker: FU with EP as planned.   5. Persistent AFib/Flutter:  HR is now uncontrolled.  Metoprolol was reduced 2 days ago (100 bid >> 50 bid) to avoid hypotension with add'n of Spironolactone.  Will adjust Metoprolol to 75 mg bid.  He does note SE of cold intolerance with higher dose of Metoprolol.  This is actually improved on 50 bid.  If this continues to be a significant SE, he may need to reconsider AVN ablation with Dr. Thompson Grayer.  He remains on Xarelto 15 mg QD.   6. Chronic kidney disease (CKD), stage III (moderate):  Follow renal function closely with diuresis.  Consider consulting Nephrology to follow.  7. Ascites:  Consider paracentesis during hospital stay.  Dr. Harrington Challenger will call Dr. Oletta Lamas (GI) to apprise him of his admit.  8. Diarrhea:  The patient is  concerned this is related to [place].  Diarrhea can occur in < 5% of patients. Consider changing Xarelto to Eliquis or Coumadin. He is < 80 and > 60 kg (he could take Eliquis 5 mg bid if that drug is chosen).  9. Insomnia:  He needs Ambien 10 mg Q night.  10. Leg Cramps:  He takes Mg2+ and Valium prn.      Medication Changes: Current medicines are reviewed at length with the patient today.  Concerns regarding medicines are as outlined above.  The following changes have been made:   Discontinued Medications   XARELTO 15 MG TABS TABLET    TAKE 1 TABLET BY MOUTH EVERY DAY   Modified Medications   No medications on file   New Prescriptions   No medications on file   Labs/ tests ordered today include:   Orders Placed This Encounter  Procedures    . EKG 12-Lead     Disposition:    Admit to [facility] today.     Signed, Versie Starks, MHS 12/02/2015 1:47 PM    [facility] [place], [place],   [address] Phone: [phone]; Fax: [phone]

## 2015-12-02 ENCOUNTER — Ambulatory Visit (INDEPENDENT_AMBULATORY_CARE_PROVIDER_SITE_OTHER): Payer: Medicare Other | Admitting: Physician Assistant

## 2015-12-02 ENCOUNTER — Encounter: Payer: Self-pay | Admitting: Physician Assistant

## 2015-12-02 ENCOUNTER — Encounter (HOSPITAL_COMMUNITY): Payer: Self-pay | Admitting: General Practice

## 2015-12-02 ENCOUNTER — Inpatient Hospital Stay (HOSPITAL_COMMUNITY)
Admission: AD | Admit: 2015-12-02 | Discharge: 2015-12-07 | DRG: 292 | Disposition: A | Discharge status: Home / Self Care | Payer: Medicare Other | Source: Ambulatory Visit | Attending: Internal Medicine | Admitting: Internal Medicine

## 2015-12-02 VITALS — BP 150/124 | HR 102 | Ht 71.0 in | Wt 260.8 lb

## 2015-12-02 DIAGNOSIS — I38 Endocarditis, valve unspecified: Secondary | ICD-10-CM | POA: Diagnosis present

## 2015-12-02 DIAGNOSIS — I495 Sick sinus syndrome: Secondary | ICD-10-CM | POA: Diagnosis present

## 2015-12-02 DIAGNOSIS — Z794 Long term (current) use of insulin: Secondary | ICD-10-CM | POA: Diagnosis not present

## 2015-12-02 DIAGNOSIS — I2510 Atherosclerotic heart disease of native coronary artery without angina pectoris: Secondary | ICD-10-CM | POA: Diagnosis present

## 2015-12-02 DIAGNOSIS — Z79899 Other long term (current) drug therapy: Secondary | ICD-10-CM

## 2015-12-02 DIAGNOSIS — I484 Atypical atrial flutter: Secondary | ICD-10-CM

## 2015-12-02 DIAGNOSIS — N183 Chronic kidney disease, stage 3 unspecified: Secondary | ICD-10-CM

## 2015-12-02 DIAGNOSIS — Z954 Presence of other heart-valve replacement: Secondary | ICD-10-CM

## 2015-12-02 DIAGNOSIS — I130 Hypertensive heart and chronic kidney disease with heart failure and stage 1 through stage 4 chronic kidney disease, or unspecified chronic kidney disease: Secondary | ICD-10-CM | POA: Diagnosis present

## 2015-12-02 DIAGNOSIS — I482 Chronic atrial fibrillation, unspecified: Secondary | ICD-10-CM | POA: Diagnosis present

## 2015-12-02 DIAGNOSIS — I5033 Acute on chronic diastolic (congestive) heart failure: Secondary | ICD-10-CM

## 2015-12-02 DIAGNOSIS — R188 Other ascites: Secondary | ICD-10-CM

## 2015-12-02 DIAGNOSIS — I4892 Unspecified atrial flutter: Secondary | ICD-10-CM | POA: Diagnosis not present

## 2015-12-02 DIAGNOSIS — Z959 Presence of cardiac and vascular implant and graft, unspecified: Secondary | ICD-10-CM | POA: Diagnosis present

## 2015-12-02 DIAGNOSIS — R1084 Generalized abdominal pain: Secondary | ICD-10-CM

## 2015-12-02 DIAGNOSIS — I10 Essential (primary) hypertension: Secondary | ICD-10-CM | POA: Diagnosis present

## 2015-12-02 DIAGNOSIS — E119 Type 2 diabetes mellitus without complications: Secondary | ICD-10-CM

## 2015-12-02 DIAGNOSIS — I481 Persistent atrial fibrillation: Secondary | ICD-10-CM | POA: Diagnosis present

## 2015-12-02 DIAGNOSIS — R0602 Shortness of breath: Secondary | ICD-10-CM | POA: Diagnosis present

## 2015-12-02 DIAGNOSIS — I272 Other secondary pulmonary hypertension: Secondary | ICD-10-CM | POA: Diagnosis present

## 2015-12-02 DIAGNOSIS — I5032 Chronic diastolic (congestive) heart failure: Secondary | ICD-10-CM

## 2015-12-02 DIAGNOSIS — Z950 Presence of cardiac pacemaker: Secondary | ICD-10-CM | POA: Diagnosis not present

## 2015-12-02 DIAGNOSIS — Z91013 Allergy to seafood: Secondary | ICD-10-CM | POA: Diagnosis not present

## 2015-12-02 DIAGNOSIS — I483 Typical atrial flutter: Secondary | ICD-10-CM | POA: Diagnosis not present

## 2015-12-02 DIAGNOSIS — G4700 Insomnia, unspecified: Secondary | ICD-10-CM | POA: Diagnosis present

## 2015-12-02 DIAGNOSIS — Z880 Allergy status to penicillin: Secondary | ICD-10-CM

## 2015-12-02 DIAGNOSIS — I350 Nonrheumatic aortic (valve) stenosis: Secondary | ICD-10-CM | POA: Diagnosis not present

## 2015-12-02 DIAGNOSIS — Z951 Presence of aortocoronary bypass graft: Secondary | ICD-10-CM

## 2015-12-02 DIAGNOSIS — Z91040 Latex allergy status: Secondary | ICD-10-CM | POA: Diagnosis not present

## 2015-12-02 DIAGNOSIS — R252 Cramp and spasm: Secondary | ICD-10-CM | POA: Diagnosis present

## 2015-12-02 DIAGNOSIS — Z952 Presence of prosthetic heart valve: Secondary | ICD-10-CM

## 2015-12-02 DIAGNOSIS — R197 Diarrhea, unspecified: Secondary | ICD-10-CM | POA: Diagnosis present

## 2015-12-02 DIAGNOSIS — I259 Chronic ischemic heart disease, unspecified: Secondary | ICD-10-CM | POA: Diagnosis present

## 2015-12-02 DIAGNOSIS — I5043 Acute on chronic combined systolic (congestive) and diastolic (congestive) heart failure: Secondary | ICD-10-CM | POA: Diagnosis not present

## 2015-12-02 LAB — CBC WITH DIFFERENTIAL/PLATELET
Basophils Absolute: 0 10*3/uL (ref 0.0–0.1)
Basophils Relative: 0 %
EOS PCT: 1 %
Eosinophils Absolute: 0.1 10*3/uL (ref 0.0–0.7)
HCT: 40 % (ref 39.0–52.0)
HEMOGLOBIN: 12.6 g/dL — AB (ref 13.0–17.0)
LYMPHS ABS: 0.5 10*3/uL — AB (ref 0.7–4.0)
LYMPHS PCT: 9 %
MCH: 24.9 pg — AB (ref 26.0–34.0)
MCHC: 31.5 g/dL (ref 30.0–36.0)
MCV: 79.1 fL (ref 78.0–100.0)
Monocytes Absolute: 0.5 10*3/uL (ref 0.1–1.0)
Monocytes Relative: 9 %
NEUTROS PCT: 80 %
Neutro Abs: 4.2 10*3/uL (ref 1.7–7.7)
Platelets: 85 10*3/uL — ABNORMAL LOW (ref 150–400)
RBC: 5.06 MIL/uL (ref 4.22–5.81)
RDW: 20.8 % — ABNORMAL HIGH (ref 11.5–15.5)
WBC: 5.2 10*3/uL (ref 4.0–10.5)

## 2015-12-02 LAB — GLUCOSE, CAPILLARY
GLUCOSE-CAPILLARY: 105 mg/dL — AB (ref 65–99)
Glucose-Capillary: 98 mg/dL (ref 65–99)

## 2015-12-02 LAB — COMPREHENSIVE METABOLIC PANEL
ALK PHOS: 66 U/L (ref 38–126)
ALT: 15 U/L — AB (ref 17–63)
AST: 22 U/L (ref 15–41)
Albumin: 3.6 g/dL (ref 3.5–5.0)
Anion gap: 7 (ref 5–15)
BUN: 46 mg/dL — ABNORMAL HIGH (ref 6–20)
CALCIUM: 9.1 mg/dL (ref 8.9–10.3)
CO2: 25 mmol/L (ref 22–32)
CREATININE: 2.66 mg/dL — AB (ref 0.61–1.24)
Chloride: 106 mmol/L (ref 101–111)
GFR, EST AFRICAN AMERICAN: 25 mL/min — AB (ref >60)
GFR, EST NON AFRICAN AMERICAN: 22 mL/min — AB (ref >60)
Glucose, Bld: 119 mg/dL — ABNORMAL HIGH (ref 65–99)
Potassium: 4.6 mmol/L (ref 3.5–5.1)
Sodium: 138 mmol/L (ref 135–145)
Total Bilirubin: 1.5 mg/dL — ABNORMAL HIGH (ref 0.3–1.2)
Total Protein: 6.5 g/dL (ref 6.5–8.1)

## 2015-12-02 LAB — APTT: APTT: 34 s (ref 24–37)

## 2015-12-02 LAB — TROPONIN I: Troponin I: 0.06 ng/mL — ABNORMAL HIGH (ref <0.031)

## 2015-12-02 LAB — PROTIME-INR
INR: 1.5 — ABNORMAL HIGH (ref 0.00–1.49)
PROTHROMBIN TIME: 18.2 s — AB (ref 11.6–15.2)

## 2015-12-02 LAB — BRAIN NATRIURETIC PEPTIDE: B Natriuretic Peptide: 1383.6 pg/mL — ABNORMAL HIGH (ref 0.0–100.0)

## 2015-12-02 MED ORDER — DIPHENOXYLATE-ATROPINE 2.5-0.025 MG PO TABS
1.0000 | ORAL_TABLET | Freq: Every day | ORAL | Status: DC | PRN
  Administered 2015-12-03: 2 via ORAL
  Filled 2015-12-02: qty 2

## 2015-12-02 MED ORDER — ACETAMINOPHEN 325 MG PO TABS
650.0000 mg | ORAL_TABLET | ORAL | Status: DC | PRN
  Administered 2015-12-05: 650 mg via ORAL
  Filled 2015-12-02: qty 2

## 2015-12-02 MED ORDER — ZOLPIDEM TARTRATE 5 MG PO TABS: 5.0000 mg | ORAL_TABLET | Freq: Every day | ORAL | Status: DC

## 2015-12-02 MED ORDER — INSULIN ASPART 100 UNIT/ML ~~LOC~~ SOLN
0.0000 [IU] | Freq: Three times a day (TID) | SUBCUTANEOUS | Status: DC
  Administered 2015-12-04 – 2015-12-05 (×2): 3 [IU] via SUBCUTANEOUS

## 2015-12-02 MED ORDER — DIAZEPAM 5 MG PO TABS
10.0000 mg | ORAL_TABLET | Freq: Every day | ORAL | Status: DC | PRN
  Administered 2015-12-04 – 2015-12-06 (×4): 10 mg via ORAL
  Filled 2015-12-02 (×4): qty 2

## 2015-12-02 MED ORDER — ACETAMINOPHEN 500 MG PO TABS
1000.0000 mg | ORAL_TABLET | Freq: Two times a day (BID) | ORAL | Status: DC | PRN
  Administered 2015-12-04 – 2015-12-06 (×2): 1000 mg via ORAL
  Filled 2015-12-02 (×2): qty 2

## 2015-12-02 MED ORDER — SODIUM CHLORIDE 0.9 % IJ SOLN
3.0000 mL | Freq: Two times a day (BID) | INTRAMUSCULAR | Status: DC
  Administered 2015-12-02 – 2015-12-06 (×10): 3 mL via INTRAVENOUS

## 2015-12-02 MED ORDER — TERAZOSIN HCL 5 MG PO CAPS
10.0000 mg | ORAL_CAPSULE | Freq: Every day | ORAL | Status: DC
  Filled 2015-12-02: qty 2

## 2015-12-02 MED ORDER — ONDANSETRON HCL 4 MG/2ML IJ SOLN: 4.0000 mg | Freq: Four times a day (QID) | INTRAMUSCULAR | Status: DC | PRN

## 2015-12-02 MED ORDER — RIVAROXABAN 15 MG PO TABS
15.0000 mg | ORAL_TABLET | Freq: Every evening | ORAL | Status: DC
  Administered 2015-12-02: 15 mg via ORAL
  Filled 2015-12-02: qty 1

## 2015-12-02 MED ORDER — METOPROLOL TARTRATE 50 MG PO TABS
75.0000 mg | ORAL_TABLET | Freq: Two times a day (BID) | ORAL | Status: DC
  Administered 2015-12-02 – 2015-12-07 (×10): 75 mg via ORAL
  Filled 2015-12-02 (×10): qty 1

## 2015-12-02 MED ORDER — TERAZOSIN HCL 5 MG PO CAPS
10.0000 mg | ORAL_CAPSULE | Freq: Every day | ORAL | Status: DC
  Administered 2015-12-02 – 2015-12-07 (×6): 10 mg via ORAL
  Filled 2015-12-02 (×9): qty 2

## 2015-12-02 MED ORDER — SODIUM CHLORIDE 0.9 % IV SOLN: 250.0000 mL | INTRAVENOUS | Status: DC | PRN

## 2015-12-02 MED ORDER — MAGNESIUM OXIDE 400 (241.3 MG) MG PO TABS
400.0000 mg | ORAL_TABLET | Freq: Every day | ORAL | Status: DC
  Filled 2015-12-02 (×4): qty 1

## 2015-12-02 MED ORDER — INSULIN GLARGINE 100 UNIT/ML ~~LOC~~ SOLN
2.0000 [IU] | Freq: Every day | SUBCUTANEOUS | Status: DC
  Administered 2015-12-03: 2 [IU] via SUBCUTANEOUS
  Filled 2015-12-02 (×5): qty 0.02

## 2015-12-02 MED ORDER — SODIUM CHLORIDE 0.9 % IJ SOLN: 3.0000 mL | INTRAMUSCULAR | Status: DC | PRN

## 2015-12-02 MED ORDER — ZOLPIDEM TARTRATE 5 MG PO TABS
10.0000 mg | ORAL_TABLET | Freq: Every day | ORAL | Status: DC
  Administered 2015-12-02 – 2015-12-05 (×4): 10 mg via ORAL
  Filled 2015-12-02 (×4): qty 2

## 2015-12-02 MED ORDER — ALLOPURINOL 100 MG PO TABS
200.0000 mg | ORAL_TABLET | Freq: Every day | ORAL | Status: DC
  Administered 2015-12-03 – 2015-12-07 (×5): 200 mg via ORAL
  Filled 2015-12-02 (×5): qty 2

## 2015-12-02 MED ORDER — FUROSEMIDE 10 MG/ML IJ SOLN
60.0000 mg | Freq: Every day | INTRAMUSCULAR | Status: DC
  Administered 2015-12-02 – 2015-12-05 (×4): 60 mg via INTRAVENOUS
  Filled 2015-12-02 (×5): qty 6

## 2015-12-02 NOTE — Consult Note (Addendum)
[facility] Consultation Note Requesting Physician:  Dr. 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 Dr. [name] follows him for CKD3/4 (creatinine last 6 months generally 2.28-2.78. He was last seen on 12/7 with weight gain, CHF, marked ascites (new). Creatinine at that time was 2.77. Aldactone was started. He was advised to see GI next week. He was seen by Dr. Dorris Carnes in the office today with a 12 lb weight gain and acute on chronic diastolic CHF and admitted for diuresis as well as GI evaluation for his apparent new ascites. We are asked to see and follow along.  Creatinine on admission in line with recent lab on 12/9 at [facility].    [name] reports malaise, fatigue, weight gain (12 lb on scales in cards clinic), DOE, wheezing, leg swelling, anorexia, diarrhea (chronic)and early satiety.   He is currently receiving  IV lasix at a rather small dose of 60 mg IV once a day. Just got his lasix dose 1 hour ago->250 cc uop twice.  Creatinine trending for reference is as follows: 12/02/2015 03:45 PM 2.66* 0.61 - 1.24 mg/dL Final  09/08/2015 11:00 AM 3.27* 0.61 - 1.24 mg/dL Final  09/07/2015 04:08 AM 3.43* 0.61 - 1.24 mg/dL Final  09/06/2015 05:45 PM 3.36* 0.61 - 1.24 mg/dL Final  05/18/2015 01:07 PM 2.83* 0.40 - 1.50 mg/dL Final  05/12/2015 02:34 PM 2.81* 0.61 - 1.24 mg/dL Final  05/11/2015 12:36 AM 2.77* 0.61 - 1.24 mg/dL Final  05/10/2015 01:10 PM 2.93* 0.61 - 1.24 mg/dL Final  05/06/2015 02:40 AM 2.43* 0.61 - 1.24 mg/dL Final  05/04/2015 05:05 PM 2.26* 0.40 - 1.50 mg/dL Final  03/26/2015 06:08 AM 3.57* 0.50 - 1.35 mg/dL Final  03/25/2015  03:42 AM 5.01* 0.50 - 1.35 mg/dL Final  03/24/2015 04:50 PM 5.80* 0.50 - 1.35 mg/dL Final  03/24/2015 02:40 AM 6.89* 0.50 - 1.35 mg/dL Final  03/23/2015 07:43 PM 7.62* 0.50 - 1.35 mg/dL Final  03/23/2015 07:43 PM 8.00* 0.50 - 1.35 mg/dL Final    Past Medical History  Diagnosis Date  . Coronary heart disease     stent, CABG, RBBB  . Valvular heart disease     aortic stenosis/regurgitation  . Sarcoid ([place]) 2011    pulmonary and bone marrow  . Hypercalcemia 2011    due to sarcoidiosis  . Bone marrow disease   . Diabetes mellitus   . Hemorrhagic cystitis 2012  . Gout   . HTN (hypertension)   . A-fib ([place])     permanent with tachy-brady syndrome  . Myocardial infarction [facility])     1995, 2002, 2006  . OSA (obstructive sleep apnea)     use bipap setting of 10 and 12  . Complication of anesthesia 11-16-13    required alot of versed per anesthesia with cataract surgery  . Chronic kidney disease (CKD), stage III (moderate)     lov note dr Koleen Nimrod nephrology 09-17-13 on chart  . Atrial flutter (H[facility])     hx of  . CHF (congestive heart failure) ([place])   . Pneumonia 1966, 2011    hx of  . Anxiety   . Osteoarthritis   . DDD (degenerative disc disease)   .  Synovial cyst of lumbar spine     l3-l4, l4-l5, injections  . Anemia   . Hepatitis     mono  . Cancer ([place])     basal cell  . Right sciatic nerve pain     for block thursday 01-21-2014, injections  . Fungal toenail infection   . Biceps tendon tear     right  . Diabetes ([place])   . Shortness of breath dyspnea   . Presence of permanent cardiac pacemaker      Past Surgical History  Procedure Laterality Date  . Cataract surgery Right 2007  . Transurethral resection of prostate  oct. 2012    TURP  . Vasectomy  1977  . Redo median sternotomy, extracorporeal cirulation, avr, tricuspid valve repair  06/13/2011    AVR(23-mm Edwards pericardial Magna-Ease valve./ TVrepair (34-mm Edwards MC3 annuloplasty ring  . Coronary artery  bypass graft  2006    x 5  . Cataract surgery Left 11-16-13  . Cardioversion N/A 12/30/2013    Procedure: CARDIOVERSION;  Surgeon: [name], MD;  Location: [facility] ENDOSCOPY;  Service: Cardiovascular;  Laterality: N/A;  10:49 cardioversion at 120 joules, then 150 joules, to SB  used  Lido 31m,  Propofol 160 mcg  . Colonoscopy N/A 01/29/2014    Procedure: COLONOSCOPY;  Surgeon: JWinfield Cunas, MD;  [facility];  Service: Endoscopy;  Laterality: N/A;  amanda//ja  . Cardioversion  2003, 2006, 2012, 2013  . Portacath placement    . Portacath removed    . Basal cell carcinoma excision Right 2015    ear  . Total knee arthroplasty Right 05/24/2014    Procedure: RIGHT TOTAL KNEE ARTHROPLASTY;  Surgeon: FGearlean Alf MD;  Location: [facility] ORS;  Service: Orthopedics;  Laterality: Right;  . Ep implantable device N/A 05/05/2015    Procedure: Pacemaker Implant;  Surgeon: JThompson Grayer MD;  [facility];  Service: Cardiovascular;  Laterality: N/A;  . Insert / replace / remove pacemaker  05/05/2015     Family History  Problem Relation Age of Onset  . Heart attack Father   . Stroke Father   . Allergies    . Asthma    . Heart disease    . Cancer    . Stroke Mother    Social History:  reports that he quit smoking about 19 years ago. His smoking use included Pipe and Cigars. He has never used smokeless tobacco. He reports that he drinks alcohol. He reports that he does not use illicit drugs.  Allergies:  Allergies  Allergen Reactions  . Actos [Pioglitazone Hydrochloride] Swelling and Other (See Comments)    Severe peripheral edema  . Codeine Other (See Comments)    Makes Pt aggressive  . Depakote [Divalproex Sodium] Diarrhea    severe  . Diltiazem Other (See Comments)    lethargy and dyspnea  . Hydralazine Other (See Comments)    Severe chills and SOB   . Isosorbide Dinitrate Other (See Comments)    Severe chills and SOB   . Januvia [Sitagliptin] Diarrhea and  Other (See Comments)    bradycardia  . Loop Diuretics Other (See Comments)    Spike in BUN and creatine levels  . Losartan Other (See Comments)    aphasia  . Nsaids Other (See Comments)    Renal problems  . Onglyza [Saxagliptin] Diarrhea and Other (See Comments)    bradycardia  . Orudis [Ketoprofen] Other (See Comments)    Cannot take due to renal  insufficiency  . Penicillins Swelling and Other (See Comments)    Serum sickness  . Potassium Iodide Itching and Rash    Severe entire body itching and rash  . Rozerem [Ramelteon] Diarrhea    Severe   . Amiodarone Other (See Comments)    diarrhea  . Latex Swelling  . Verapamil Other (See Comments)    Severe fatigue  . Benazepril Hcl Other (See Comments)    ? Possible lowers plateletes  . Indomethacin Other (See Comments)    Renal problems   . Minocycline Other (See Comments)    Makes dizzy and feel just lousy.  [name] Pentazocine Lactate Other (See Comments)    Unknown allergic reaction - pt and wife do not recall this  . Poison Ivy Extract [Extract Of Poison Ivy] Rash and Other (See Comments)    blisters  . Sertraline Hcl Other (See Comments)    Pt and wife do not recall this  . Shellfish Allergy Rash    Home medications: Prior to Admission medications   Medication Sig Start Date End Date Taking? Authorizing Provider  acetaminophen (TYLENOL) 500 MG tablet Take 1,000 mg by mouth 2 (two) times daily as needed (pain).    Yes Historical Provider, MD  allopurinol (ZYLOPRIM) 100 MG tablet Take 200 mg by mouth daily with breakfast.  08/06/11  Yes [name], MD  diazepam (VALIUM) 10 MG tablet Take 1 tablet by mouth daily as needed for anxiety or sleep. Ankle and foot cramps 04/29/15  Yes Historical Provider, MD  diphenhydrAMINE (BENADRYL) 25 MG tablet Take 25 mg by mouth every 6 (six) hours as needed for allergies.   Yes Historical Provider, MD  diphenoxylate-atropine (LOMOTIL) 2.5-0.025 MG per tablet Take 1-2 tablets by mouth daily  as needed. stomach 04/06/15  Yes Historical Provider, MD  furosemide (LASIX) 40 MG tablet Take 1 tablet (4m) by mouth every Tuesday, Thursday, Sat and Sun.Take an extras lasix (457m if weight is up by 2lb in one day or 5 lb in one week. 09/08/15  Yes Bhavinkumar Bhagat, PA  insulin glargine (LANTUS) 100 unit/mL SOPN Inject 2-3 Units into the skin daily after breakfast. CBG 120-140 2 units, 140-160 3 units   Yes Historical Provider, MD  Magnesium 250 MG TABS Take 125 mg by mouth daily.    Yes Historical Provider, MD  metoprolol (LOPRESSOR) 100 MG tablet Take 50 mg by mouth 2 (two) times daily.    Yes Historical Provider, MD  spironolactone (ALDACTONE) 25 MG tablet Take 25 mg by mouth daily. 11/30/15  Yes Historical Provider, MD  terazosin (HYTRIN) 10 MG capsule Take 10 mg by mouth daily with breakfast. 08/15/11  Yes ThDarlin CocoMD  XARELTO 15 MG TABS tablet Take 15 mg by mouth every evening. 09/05/15  Yes Historical Provider, MD  zolpidem (AMBIEN) 10 MG tablet Take 10 mg by mouth at bedtime.    Yes Historical Provider, MD  testosterone cypionate (DEPOTESTOSTERONE CYPIONATE) 200 MG/ML injection Inject 75 mg into the muscle every 14 (fourteen) days. TWICE  A MONTH 11/21/15   Historical Provider, MD  Testosterone Cypionate 200 MG/ML KIT Inject 1 mL into the muscle every 14 (fourteen) days.    Historical Provider, MD    Inpatient medications: . [START ON 12/03/2015] allopurinol  200 mg Oral Q breakfast  . furosemide  60 mg Intravenous Daily  . insulin aspart  0-20 Units Subcutaneous TID WC  . [START ON 12/03/2015] insulin glargine  2 Units Subcutaneous QPC breakfast  . [START ON 12/03/2015]  magnesium oxide  400 mg Oral Daily  . metoprolol  75 mg Oral BID  . Rivaroxaban  15 mg Oral QPM  . sodium chloride  3 mL Intravenous Q12H  . terazosin  10 mg Oral Q breakfast  . zolpidem  10 mg Oral QHS     Review of Systems See HPI  Physical Exam:  BP 153/90 mmHg  Pulse 64  Temp(Src) 97.5 F (36.4  C) (Oral)  Resp 18  Ht 5' 11"  (1.803 m)  Wt 254 lb 1.6 oz (115.259 kg)  BMI 35.46 kg/m2  SpO2 100%  Gen: Delightful older gentleman, articulate, talkative Skin: no rash, cyanosis Neck: Cannot see neck veins Chest: Grossly clear Heart: Irreg S1S2 No S3 Prosthetic valve sound. No rub Pacemaker site NT Abdomen: soft, quite protuberant, not tender and not taut Ext:1+ edema LE's Neuro: alert, Ox3, no focal deficit   Labs: Basic Metabolic Panel:  Recent Labs Lab 12/02/15 1545  NA 138  K 4.6  CL 106  CO2 25  GLUCOSE 119*  BUN 46*  CREATININE 2.66*  CALCIUM 9.1   Liver Function Tests:  Recent Labs Lab 12/02/15 1545  AST 22  ALT 15*  ALKPHOS 66  BILITOT 1.5*  PROT 6.5  ALBUMIN 3.6   Recent Labs Lab 12/02/15 1545  WBC 5.2  NEUTROABS 4.2  HGB 12.6*  HCT 40.0  MCV 79.1  PLT 85*    Recent Labs Lab 12/02/15 1545  TROPONINI 0.06*     Recent Labs Lab 12/02/15 1726  [place] 28*   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 Dr. [name] follows him for CKD3/4 (creatinine last 6 months generally 2.28-2.78. He was last seen on 12/7 with weight gain, CHF, worsened ascities). Creatinine at that time was 2.77. Aldactone was started. He was advised to see GI next week. He was seen by Dr. Dorris Carnes in the office 12/9  with a 12 lb weight gain and acute on chronic diastolic CHF and was admitted for diuresis as well as GI evaluation for his apparent worsening ascites. We are asked to see and follow along.  Creatinine on admission was 2.66 - in line with recent lab on 12/9 at [facility].    Assessment/Recommendations  1. Acute on chronic diastolic HF - volume overload, edema, 12 lb wt gain. Cards has started lasix 60 IV qd.    Spironolactone has been held.  2. CKD4 - creatinine at most recent baseline of 2.66-2.77 (GFR around 20). Watch renal function with diuresis 3. Ascites - worsened PTA? Cirrhosis. H/O fatty liver.  GI to see. Recommend if paracentesis done - no more than 2 liters off and give albumin support. (He MIGHT be able to get by without paracentesis)  4.  AVR and prior TV repair 5. AFF - uncontrolled. BB for rate - dose increased. On xarelto. 6. HTN - metoprolol increased.   Jamal Maes,  MD [facility] [phone] pager 12/02/2015, 7:22 PM

## 2015-12-02 NOTE — Patient Instructions (Signed)
YOU HAVE BEEN ADMITTED TO [facility] 

## 2015-12-02 NOTE — H&P (Signed)
History and Physical   Date:  12/02/2015   ID:  [name] Fiddler, MD, DOB 14-Nov-1941, MRN [id number]   Patient Care Team: Briscoe Deutscher, MD as PCP - General (Family Medicine) Gaye Pollack, MD (Cardiothoracic Surgery) [name], MD as Consulting Physician (Cardiology) Thompson Grayer, MD as Consulting Physician (Clinical Cardiac Electrophysiology) Donato Heinz, MD as Consulting Physician (Nephrology) Laurence Spates, MD as Consulting Physician (Gastroenterology)    Chief Complaint  Patient presents with  . Follow-up  . Congestive Heart Failure  . 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 He is on long-term anticoagulation with Xarelto. He is s/p multiple DCCVs in the past (last January 2015). He is intolerant to amiodarone due to diarrhea.   Admitted in 3/16 after a mechanical fall resulting in fractured R 7th. He returned to the hospital with ARF and idioventricular rhythm with heart rate as low as 24. Hospitalization was complicated by Mallory-Weiss tear.   In FU 5/16, he was back in AFlutter with SVR and referred to EP. He saw Dr. Rayann Heman and was noted to have symptomatic persistent AFib and atypical AFlutter and tachycardia with minimal exertion. Options included pacemaker implantation with medical rate control versus ablation. It was ultimately decided to proceed with pacemaker implantation. If this was unsuccessful, consideration could be given towards AV nodal ablation in the future. He underwent implantation of a dual-chamber St. Jude pacemaker on 05/05/15.   Admitted from the office in 5/16 with evidence of a/c diastolic HF. Echo demonstrated normal LVF with stable gradients across his AVR. Amlodipine was DC'd and  Diltiazem was increased.   Admitted 9/16 with acute on chronic diastolic CHF. Device was reprogrammed from VVIR to DDDR.    Last seen by Dr. Thompson Grayer 09/19/15.  Device was left in DDDR mode (Dr. Rayann Heman noted that VVIR mode was unlikely to cause decompensation).    He was seen by Dr. Arty Baumgartner 2 days ago and was noted to be volume overloaded.  He has a long hx of diarrhea.  He is being referred to Dr. Oletta Lamas (GI).  His abdomen was noted to be distended and Dr. Loletha Grayer recommended considering paracentesis.  [name] is chronically short of breath.  He is NYHA 2b-3.  Denies significant worsening. Denies orthopnea, PND. LE edema unchanged.  He denies chest pain, syncope.  He has noted worsening fatigue and decreased exercise tolerance.    No cough or wheezing.     Studies/Reports Reviewed Today:  Echo 09/07/15 Inferobasal HK, mild LVH, EF 54%, bioprosthetic AVR (mean gradient 7 mmHg), MAC, mild MR, severe LAE, mild RVE, mild RAE, PASP 36 mmHg  VQ Scan 05/10/15 IMPRESSION: Normal examination. No evidence of pulmonary embolism.  Echo 05/11/15 Mild LVH, EF 55-60%, normal wall motion, bioprosthetic AVR okay with mean gradient 27 mm Hg and peak Peak gradient 58 mm Hg, MAC, mild MR, moderate LAE, mild RAE, moderate TR   Pacer Implant 05/05/15 1. 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 No early apparent complications.   Echo 12/13/14 Severe LVH, EF 55-60%, bioprosthetic AVR okay with mean gradient 29 mmHg, MAC, moderate LAE  Carotid US 06/07/14 Bilateral ICA 1-39%  LHC 05/30/11 LM: Clot  prior to takeoff of LAD and circumflex LAD: Occluded LCx: Proximal 70% RCA: Not injected; known to be occluded SVG-DX: Patent SVG-OM1/OM2: Patent SVG-PDA: Patent L-LAD: Patent    Past Medical History  Diagnosis Date  . Coronary heart disease     stent, CABG, RBBB  . Valvular heart  disease     aortic stenosis/regurgitation  . Sarcoid ([place]) 2011    pulmonary and bone marrow  . Hypercalcemia 2011    due to sarcoidiosis  . Bone marrow disease   . Diabetes mellitus   . Hemorrhagic cystitis 2012  . Gout   . HTN (hypertension)   . A-fib ([place])     permanent with tachy-brady syndrome  . Myocardial infarction [facility])     1995, 2002, 2006  . OSA (obstructive sleep apnea)     use bipap setting of 10 and 12  . Complication of anesthesia 11-16-13    required alot of versed per anesthesia with cataract surgery  . Chronic kidney disease (CKD), stage III (moderate)     lov note dr Koleen Nimrod nephrology 09-17-13 on chart  . Atrial flutter ([facility])     hx of  . CHF (congestive heart failure) ([place])   . Pneumonia 1966, 2011    hx of  . Anxiety   . Osteoarthritis   . DDD (degenerative disc disease)   . Synovial cyst of lumbar spine     l3-l4, l4-l5, injections  . Anemia   . Hepatitis     mono  . Cancer ([place])     basal cell  . Right sciatic nerve pain     for block thursday 01-21-2014, injections  . Fungal toenail infection   . Biceps tendon tear     right  . Diabetes ([place])   . Shortness of breath dyspnea   . Presence of permanent cardiac pacemaker     Past Surgical History  Procedure Laterality Date  . Cataract surgery Right 2007  . Transurethral resection of prostate  oct. 2012    TURP  . Vasectomy  1977  . Redo median sternotomy, extracorporeal cirulation, avr, tricuspid valve repair  06/13/2011    AVR(23-mm Edwards pericardial Magna-Ease valve./ TVrepair (34-mm Edwards MC3 annuloplasty ring  . Coronary artery bypass graft  2006    x 5  . Cataract surgery Left 11-16-13  . Cardioversion N/A 12/30/2013    Procedure: CARDIOVERSION;  Surgeon: [name], MD;  Location: [facility] ENDOSCOPY;  Service: Cardiovascular;  Laterality: N/A;  10:49 cardioversion at 120 joules, then 150 joules, to SB  used  Lido 38m,  Propofol 160 mcg  . Colonoscopy N/A 01/29/2014    Procedure:  COLONOSCOPY;  Surgeon: JWinfield Cunas, MD;  [facility];  Service: Endoscopy;  Laterality: N/A;  amanda//ja  . Cardioversion  2003, 2006, 2012, 2013  . Portacath placement    . Portacath removed    . Basal cell carcinoma excision Right 2015    ear  . Total knee arthroplasty Right 05/24/2014    Procedure: RIGHT TOTAL KNEE ARTHROPLASTY;  Surgeon: FGearlean Alf MD;  Location: [facility] ORS;  Service: Orthopedics;  Laterality: Right;  . Ep implantable device N/A 05/05/2015    Procedure: Pacemaker Implant;  Surgeon: JThompson Grayer MD;  [facility];  Service: Cardiovascular;  Laterality: N/A;  . Insert / replace / remove pacemaker  05/05/2015     Current Outpatient Prescriptions  Medication Sig Dispense Refill  . acetaminophen (TYLENOL) 500 MG tablet Take 1,000 mg by mouth  2 (two) times daily as needed (pain).     [name] allopurinol (ZYLOPRIM) 100 MG tablet Take 200 mg by mouth daily with breakfast.     . COCONUT OIL PO Apply 1 application topically daily as needed (dry skin).    [name] diazepam (VALIUM) 10 MG tablet Take 1 tablet by mouth daily as needed for anxiety or sleep. Ankle and foot cramps  0  . diphenoxylate-atropine (LOMOTIL) 2.5-0.025 MG per tablet Take 1-2 tablets by mouth daily as needed. stomach  1  . furosemide (LASIX) 40 MG tablet Take 1 tablet (47m) by mouth every Tuesday, Thursday, Sat and Sun.Take an extras lasix (449m if weight is up by 2lb in one day or 5 lb in one week. 30 tablet 2  . insulin glargine (LANTUS) 100 unit/mL SOPN Inject 2-3 Units into the skin daily after breakfast. CBG 120-140 2 units, 140-160 3 units    . Magnesium 250 MG TABS Take 125 mg by mouth daily.     . metoprolol (LOPRESSOR) 100 MG tablet Take 50 mg by mouth 2 (two) times daily.     . Marland Kitchenpironolactone (ALDACTONE) 25 MG tablet Take 25 mg by mouth daily.  0  . terazosin (HYTRIN) 10 MG capsule Take 10 mg by mouth daily with breakfast.    . TERBINAFINE EX Apply 1 drop topically at  bedtime. Place 1 drop on each toenail.    . Marland Kitchenestosterone cypionate (DEPOTESTOSTERONE CYPIONATE) 200 MG/ML injection Inject into the muscle. TWICE  A MONTH  1  . Testosterone Cypionate 200 MG/ML KIT Inject 1 mL into the muscle every 14 (fourteen) days.    . Alveda Reasons5 MG TABS tablet Take 15 mg by mouth every evening.  0  . zolpidem (AMBIEN) 10 MG tablet Take 10 mg by mouth at bedtime.      No current facility-administered medications for this visit.    Allergies:   Actos; Hydralazine; Isosorbide dinitrate; Penicillins; Potassium iodide; Amiodarone; Latex; Codeine; Depakote; Diltiazem; Januvia; Loop diuretics; Losartan; Nsaids; Onglyza; Orudis; Pentazocine lactate; Rozerem; Sertraline hcl; Verapamil; Benazepril hcl; Indomethacin; Minocycline; Poison ivy extract; and Shellfish allergy    Social History:   Social History   Social History  . Marital Status: Married    Spouse Name: N/A  . Number of Children: 4  . Years of Education: N/A   Occupational History  . retired inAdministrator, [employer] Service   due to coronary disease in 2000  . 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 Smoking status: Former Smoker -- 28 years    Types: Pipe, Cigars    Quit date: 12/25/1995  . Smokeless tobacco: Never Used  . Alcohol Use: Yes     Comment: 3-5 beer or wine per week  . Drug Use: No  . Sexual Activity: Not Asked   Other Topics Concern  . None   Social History Narrative     Family History:   Family History  Problem Relation Age of Onset  . Heart attack Father   . Stroke Father   . Allergies    . Asthma    . Heart disease    . Cancer    . Stroke Mother       ROS:   Please see the history of present illness.   Review of Systems  Constitution: Positive for chills, decreased appetite, malaise/fatigue and weight gain.  Cardiovascular: Positive for dyspnea on exertion and irregular heartbeat.  Respiratory: Positive for wheezing.   Gastrointestinal:  Positive for  diarrhea.      PHYSICAL EXAM: VS:  BP 150/124 mmHg  Pulse 102  Ht _0  (1.803 m)  Wt 260 lb 12.8 oz (118.298 kg)  BMI 36.39 kg/m2    Wt Readings from Last 3 Encounters:  12/02/15 260 lb 12.8 oz (118.298 kg)  09/19/15 252 lb 12.8 oz (114.669 kg)  09/08/15 248 lb 14.4 oz (112.9 kg)     GEN: Well nourished, well developed, in no acute distress HEENT: + periorbital edema Neck: JVP 10 cm,no masses Cardiac:  Normal S1/S2, irregularly irregular rhythm; no murmur ,  no rubs or gallops, 1+ bilateral LE edema   Respiratory:  clear to auscultation bilaterally, no wheezing, rhonchi or rales. GI:  Massively distended, no hepatomegaly MS: no deformity or atrophy, + presacral edema.  Skin: warm and dry  Neuro:  CNs II-XII intact, Strength and sensation are intact Psych: Normal affect   EKG:  EKG is ordered today.  It demonstrates:   Underlying AFib/Flutter, HR 102, LAD, IVCD, RBBB, no sig change from prior tracing   Recent Labs: 09/06/2015: ALT 16*; B Natriuretic Peptide 590.2*; TSH 3.952 09/08/2015: BUN 58*; Creatinine, Ser 3.27*; Hemoglobin 12.7*; Platelets 85*; Potassium 4.2; Sodium 134*    Lipid Panel    Component Value Date/Time   CHOL 106 05/28/2014 0140   TRIG 169* 05/28/2014 0140   HDL 14* 05/28/2014 0140   CHOLHDL 7.6 05/28/2014 0140   VLDL 34 05/28/2014 0140   LDLCALC 58 05/28/2014 0140      ASSESSMENT AND PLAN:  1. Acute on Chronic diastolic CHF (congestive heart failure): Review of echo from September RV function is OK  LV function is OK  Pt  is volume overloaded with evidence of anasarca (ascites).  Weight is up 12 lbs by our scales.  He only take Lasix 40 mg 4 x a week.  He was placed on Spironolactone 2 days ago. He has had minimal improvement.  Recent Creatinine 2.77 on 12/6.  I have recommended admission to the hospital for IV diuresis.  He was also seen by Dr. Dorris Carnes.  -  Admit to [facility]  -  Start Lasix 60 mg IV daily tonight.  May need to adjust based  upon diuresis and SCr.  -  Follow renal function closely  -  Hold Spironolactone.  Consider resuming after effective diuresis.    Consider paracentesis tomorrow for more efficeint removal     2. Coronary artery disease: S/p CABG in 2006. Continue beta blocker. He is not on ASA as he is on Xarelto.   3. VALVULAR HEART DISEASE s/p Bioprosthetic AVR and TV Repair:  Stable AVR on recent echo. Continue SBE prophylaxis.   4. Sick sinus syndrome S/P placement of cardiac pacemaker: FU with EP as planned.   5. Persistent AFib/Flutter:  HR is now uncontrolled.  Metoprolol was reduced 2 days ago (100 bid >> 50 bid) to avoid hypotension with add'n of Spironolactone.  Will adjust Metoprolol to 75 mg bid.  He does note SE of cold intolerance with higher dose of Metoprolol.  This is actually improved on 50 bid.  If this continues to be a significant SE, he may need to reconsider AVN ablation with Dr. Thompson Grayer.  He remains on Xarelto 15 mg QD.   6. Chronic kidney disease (CKD), stage III (moderate):  Follow renal function closely with diuresis.  Nephrology to see pt  .  7. Ascites:  Consider paracentesis during hospital stay.  Dr. Harrington Challenger  will call Dr. Oletta Lamas (GI) to apprise him of his admit.  8. Diarrhea:  The patient is concerned this is related to [place].  Diarrhea can occur in < 5% of patients. Consider changing Xarelto to Eliquis or Coumadin. He is < 80 and > 60 kg (he could take Eliquis 5 mg bid if that drug is chosen).  9. Insomnia:  He needs Ambien 10 mg Q night.  10. Leg Cramps:  He takes Mg2+ and Valium prn.  11. HTN:  BP uncontrolled.  Increase Metoprolol Tartrate as noted.      Medication Changes: Current medicines are reviewed at length with the patient today.  Concerns regarding medicines are as outlined above.  The following changes have been made:   Discontinued Medications   XARELTO 15 MG TABS TABLET    TAKE 1 TABLET BY MOUTH EVERY DAY   Modified Medications   No  medications on file   New Prescriptions   No medications on file   Labs/ tests ordered today include:   Orders Placed This Encounter  Procedures  . EKG 12-Lead     Disposition:    Admit to [facility] today.     Signed, Versie Starks, MHS 12/02/2015 1:27 PM    [facility] [place], [place], [place]  [address] Phone: [phone]; Fax: [phone]   Pt seen and examined Pt with multiple medical problems I have amended findings abov.[email or web address]     I have been in touch with J Colodonato  He saw pt earlier this wk  Made appt for him to be seen in cardiology today.  Concerned about increased edema/ ascites  Has been following his renal function  Recently added aldactone  Pt's weight is down a little  Still with increased volume on exam  Per J Colodonato, if paracentesis is planned would recomm 2 L at the most so as not to jeopardize renal function   Pt's priimary GI is Dr Oletta Lamas who is not available   I have discussed with M Magod  GI will plan to see pt as well  Recomm if paracentesis is done that fluid be sent for cell count and albumin  Pt is followed routinely by T Brackbill and J Allred, both of whom are not availble.   WIll plan to rate control  Hold anticoagulation tonight  Will need to plan fluid removal   Dorris Carnes 12/02/2015

## 2015-12-03 ENCOUNTER — Other Ambulatory Visit: Payer: Self-pay

## 2015-12-03 ENCOUNTER — Inpatient Hospital Stay (HOSPITAL_COMMUNITY): Payer: Medicare Other

## 2015-12-03 LAB — BASIC METABOLIC PANEL
ANION GAP: 9 (ref 5–15)
BUN: 43 mg/dL — ABNORMAL HIGH (ref 6–20)
CHLORIDE: 106 mmol/L (ref 101–111)
CO2: 24 mmol/L (ref 22–32)
Calcium: 8.9 mg/dL (ref 8.9–10.3)
Creatinine, Ser: 2.51 mg/dL — ABNORMAL HIGH (ref 0.61–1.24)
GFR calc non Af Amer: 23 mL/min — ABNORMAL LOW (ref >60)
GFR, EST AFRICAN AMERICAN: 27 mL/min — AB (ref >60)
Glucose, Bld: 84 mg/dL (ref 65–99)
POTASSIUM: 4.1 mmol/L (ref 3.5–5.1)
Sodium: 139 mmol/L (ref 135–145)

## 2015-12-03 LAB — TROPONIN I
Troponin I: 0.07 ng/mL — ABNORMAL HIGH (ref <0.031)
Troponin I: 0.07 ng/mL — ABNORMAL HIGH (ref <0.031)

## 2015-12-03 LAB — GLUCOSE, CAPILLARY
GLUCOSE-CAPILLARY: 85 mg/dL (ref 65–99)
GLUCOSE-CAPILLARY: 152 mg/dL — AB (ref 65–99)
GLUCOSE-CAPILLARY: 139 mg/dL — AB (ref 65–99)
Glucose-Capillary: 137 mg/dL — ABNORMAL HIGH (ref 65–99)

## 2015-12-03 LAB — SEDIMENTATION RATE: Sed Rate: 1 mm/hr (ref 0–16)

## 2015-12-03 LAB — C DIFFICILE QUICK SCREEN W PCR REFLEX
C DIFFICILE (CDIFF) INTERP: NEGATIVE
C DIFFICILE (CDIFF) TOXIN: NEGATIVE
C Diff antigen: NEGATIVE

## 2015-12-03 LAB — CORTISOL-PM, BLOOD: Cortisol - PM: 11.4 ug/dL — ABNORMAL HIGH (ref <10.0)

## 2015-12-03 LAB — TSH: TSH: 2.745 u[IU]/mL (ref 0.350–4.500)

## 2015-12-03 MED ORDER — DIPHENOXYLATE-ATROPINE 2.5-0.025 MG PO TABS
1.0000 | ORAL_TABLET | Freq: Four times a day (QID) | ORAL | Status: DC | PRN
  Administered 2015-12-03 – 2015-12-04 (×2): 2 via ORAL
  Administered 2015-12-04: 1 via ORAL
  Administered 2015-12-07: 2 via ORAL
  Filled 2015-12-03 (×6): qty 2

## 2015-12-03 MED ORDER — APIXABAN 5 MG PO TABS
5.0000 mg | ORAL_TABLET | Freq: Two times a day (BID) | ORAL | Status: DC
  Administered 2015-12-03 – 2015-12-05 (×4): 5 mg via ORAL
  Filled 2015-12-03 (×4): qty 1

## 2015-12-03 NOTE — Progress Notes (Signed)
Pt c/o about phlebotomist stuck him on his left arm at midnight to draw blood and informed this RN that his left arm should be restricted, no restricted arm band noted, pt verbalized that he had left axillary lymph node dissection in 2008, no previous history regarding this procedure listed on the chart. RN placed restricted arm band to his left arm.

## 2015-12-03 NOTE — Consult Note (Signed)
[facility] Consultation Note  Referring Provider: Dr. Dorris Carnes (cardiology) Primary Care Physician:  [name], MD Primary Gastroenterologist:  Dr. 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  Patient has history of severe diarrhea since knee surgery in Summer 2015.   Diarrhea also seemed to coincide with is initiation of Xarelto.  He describes multiple, up to 10, loose non-bloody stools per day.  No abdominal pain or blood in stool.  Eating makes diarrhea worse, but he has diarrhea at times when he's been fasting as well.  Has significant urgency.  Has occasional nocturnal stools.  Has had frequent accidents.  Trials of diphenoxylate/atropine, loperamide, probiotics (florastor), bismuth subsalicylate, Metamucil, all have not significantly helped.  He reports taking up to 6 diphenoxylate/atropine tablets per day without reliable improvement in symptoms.  His diarrhea is limiting his quality of life.  He has developed recurrent ascites over the past few weeks, as well as worsening lower extremity edema; this is not a new phenomenon, has happened before, and is not bothering him nearly as much as his diarrhea.  He is gaining weight, as a result.  Had colonoscopy in February 2015 by Dr. Oletta Lamas, given his age and family history of colon cancer (son), showing sub-cm adenomatous polyps otherwise unrevealing (no overt colitis seen, and random colon biopsies were not done).  Reports having several negative C. Diff stool tests done, last May 2016.   Past Medical History  Diagnosis Date  . Coronary heart disease     stent, CABG, RBBB  . Valvular heart disease     aortic stenosis/regurgitation  . Sarcoid ([place]) 2011    pulmonary and bone marrow  . Hypercalcemia 2011    due to sarcoidiosis  . Bone marrow disease   .  Diabetes mellitus   . Hemorrhagic cystitis 2012  . Gout   . HTN (hypertension)   . A-fib ([place])     permanent with tachy-brady syndrome  . Myocardial infarction [facility])     1995, 2002, 2006  . OSA (obstructive sleep apnea)     use bipap setting of 10 and 12  . Complication of anesthesia 11-16-13    required alot of versed per anesthesia with cataract surgery  . Chronic kidney disease (CKD), stage III (moderate)     lov note dr Koleen Nimrod nephrology 09-17-13 on chart  . Atrial flutter (H[facility])     hx of  . CHF (congestive heart failure) ([place])   . Pneumonia 1966, 2011    hx of  . Anxiety   . Osteoarthritis   . DDD (degenerative disc disease)   . Synovial cyst of lumbar spine     l3-l4, l4-l5, injections  . Anemia   . Hepatitis     mono  . Cancer ([place])     basal cell  . Right sciatic nerve pain     for block thursday 01-21-2014, injections  . Fungal toenail infection   . Biceps tendon tear     right  . Diabetes ([place])   . Shortness of breath dyspnea   . Presence of permanent cardiac pacemaker     Past Surgical History  Procedure Laterality Date  . Cataract surgery Right 2007  . Transurethral resection of prostate  oct. 2012    TURP  . Vasectomy  1977  . Redo median sternotomy,  extracorporeal cirulation, avr, tricuspid valve repair  06/13/2011    AVR(23-mm Edwards pericardial Magna-Ease valve./ TVrepair (34-mm Edwards MC3 annuloplasty ring  . Coronary artery bypass graft  2006    x 5  . Cataract surgery Left 11-16-13  . Cardioversion N/A 12/30/2013    Procedure: CARDIOVERSION;  Surgeon: [name], MD;  Location: [facility] ENDOSCOPY;  Service: Cardiovascular;  Laterality: N/A;  10:49 cardioversion at 120 joules, then 150 joules, to SB  used  Lido 6m,  Propofol 160 mcg  . Colonoscopy N/A 01/29/2014    Procedure: COLONOSCOPY;  Surgeon: JWinfield Cunas, MD;  [facility];  Service: Endoscopy;  Laterality: N/A;  amanda//ja  . Cardioversion  2003, 2006, 2012, 2013  .  Portacath placement    . Portacath removed    . Basal cell carcinoma excision Right 2015    ear  . Total knee arthroplasty Right 05/24/2014    Procedure: RIGHT TOTAL KNEE ARTHROPLASTY;  Surgeon: FGearlean Alf MD;  Location: [facility] ORS;  Service: Orthopedics;  Laterality: Right;  . Ep implantable device N/A 05/05/2015    Procedure: Pacemaker Implant;  Surgeon: JThompson Grayer MD;  [facility];  Service: Cardiovascular;  Laterality: N/A;  . Insert / replace / remove pacemaker  05/05/2015    Prior to Admission medications   Medication Sig Start Date End Date Taking? Authorizing Provider  acetaminophen (TYLENOL) 500 MG tablet Take 1,000 mg by mouth 2 (two) times daily as needed (pain).    Yes Historical Provider, MD  allopurinol (ZYLOPRIM) 100 MG tablet Take 200 mg by mouth daily with breakfast.  08/06/11  Yes TDarlin Coco MD  diazepam (VALIUM) 10 MG tablet Take 1 tablet by mouth daily as needed for anxiety or sleep. Ankle and foot cramps 04/29/15  Yes Historical Provider, MD  diphenhydrAMINE (BENADRYL) 25 MG tablet Take 25 mg by mouth every 6 (six) hours as needed for allergies.   Yes Historical Provider, MD  diphenoxylate-atropine (LOMOTIL) 2.5-0.025 MG per tablet Take 1-2 tablets by mouth daily as needed. stomach 04/06/15  Yes Historical Provider, MD  furosemide (LASIX) 40 MG tablet Take 1 tablet (471m by mouth every Tuesday, Thursday, Sat and Sun.Take an extras lasix (4056mif weight is up by 2lb in one day or 5 lb in one week. 09/08/15  Yes Bhavinkumar Bhagat, PA  insulin glargine (LANTUS) 100 unit/mL SOPN Inject 2-3 Units into the skin daily after breakfast. CBG 120-140 2 units, 140-160 3 units   Yes Historical Provider, MD  Magnesium 250 MG TABS Take 125 mg by mouth daily.    Yes Historical Provider, MD  metoprolol (LOPRESSOR) 100 MG tablet Take 50 mg by mouth 2 (two) times daily.    Yes Historical Provider, MD  spironolactone (ALDACTONE) 25 MG tablet Take 25 mg by mouth daily.  11/30/15  Yes Historical Provider, MD  terazosin (HYTRIN) 10 MG capsule Take 10 mg by mouth daily with breakfast. 08/15/11  Yes ThoDarlin CocoD  XARELTO 15 MG TABS tablet Take 15 mg by mouth every evening. 09/05/15  Yes Historical Provider, MD  zolpidem (AMBIEN) 10 MG tablet Take 10 mg by mouth at bedtime.    Yes Historical Provider, MD  testosterone cypionate (DEPOTESTOSTERONE CYPIONATE) 200 MG/ML injection Inject 75 mg into the muscle every 14 (fourteen) days. TWICE  A MONTH 11/21/15   Historical Provider, MD  Testosterone Cypionate 200 MG/ML KIT Inject 1 mL into the muscle every 14 (fourteen) days.    Historical Provider, MD  Current Facility-Administered Medications  Medication Dose Route Frequency Provider Last Rate Last Dose  . 0.9 %  sodium chloride infusion  250 mL Intravenous PRN Liliane Shi, PA-C      . acetaminophen (TYLENOL) tablet 1,000 mg  1,000 mg Oral BID PRN Liliane Shi, PA-C      . acetaminophen (TYLENOL) tablet 650 mg  650 mg Oral Q4H PRN Liliane Shi, PA-C      . allopurinol (ZYLOPRIM) tablet 200 mg  200 mg Oral Q breakfast Liliane Shi, PA-C   200 mg at 12/03/15 7846  . apixaban (ELIQUIS) tablet 5 mg  5 mg Oral BID Arnoldo Lenis, MD      . diazepam (VALIUM) tablet 10 mg  10 mg Oral Daily PRN Liliane Shi, PA-C      . diphenoxylate-atropine (LOMOTIL) 2.5-0.025 MG per tablet 1-2 tablet  1-2 tablet Oral QID PRN Arnoldo Lenis, MD      . furosemide (LASIX) injection 60 mg  60 mg Intravenous Daily Liliane Shi, PA-C   60 mg at 12/03/15 9629  . insulin aspart (novoLOG) injection 0-20 Units  0-20 Units Subcutaneous TID WC Liliane Shi, PA-C   0 Units at 12/02/15 1630  . insulin glargine (LANTUS) injection 2 Units  2 Units Subcutaneous QPC breakfast Liliane Shi, PA-C   2 Units at 12/03/15 5284  . magnesium oxide (MAG-OX) tablet 400 mg  400 mg Oral Daily Liliane Shi, PA-C   400 mg at 12/03/15 1324  . metoprolol tartrate (LOPRESSOR) tablet 75 mg  75 mg  Oral BID Liliane Shi, PA-C   75 mg at 12/03/15 4010  . ondansetron (ZOFRAN) injection 4 mg  4 mg Intravenous Q6H PRN Liliane Shi, PA-C      . sodium chloride 0.9 % injection 3 mL  3 mL Intravenous Q12H Liliane Shi, PA-C   3 mL at 12/03/15 1140  . sodium chloride 0.9 % injection 3 mL  3 mL Intravenous PRN Liliane Shi, PA-C      . terazosin (HYTRIN) capsule 10 mg  10 mg Oral Q breakfast Fay Records, MD   10 mg at 12/03/15 2725  . zolpidem (AMBIEN) tablet 10 mg  10 mg Oral QHS Liliane Shi, PA-C   10 mg at 12/02/15 2207    Allergies as of 12/02/2015 - Review Complete 12/02/2015  Allergen Reaction Noted  . Actos [pioglitazone hydrochloride] Swelling and Other (See Comments) 03/27/2011  . Codeine Other (See Comments) 09/02/2013  . Depakote [divalproex sodium] Diarrhea 05/18/2014  . Diltiazem Other (See Comments) 06/01/2015  . Hydralazine Other (See Comments) 12/09/2014  . Isosorbide dinitrate Other (See Comments) 05/04/2015  . Januvia [sitagliptin] Diarrhea and Other (See Comments) 03/23/2015  . Loop diuretics Other (See Comments) 05/18/2014  . Losartan Other (See Comments) 05/18/2014  . Nsaids Other (See Comments) 05/18/2014  . Onglyza [saxagliptin] Diarrhea and Other (See Comments) 03/23/2015  . Orudis [ketoprofen] Other (See Comments) 04/26/2011  . Penicillins Swelling and Other (See Comments)   . Potassium iodide Itching and Rash 05/11/2014  . Rozerem [ramelteon] Diarrhea 05/18/2014  . Amiodarone Other (See Comments) 04/26/2011  . Latex Swelling   . Verapamil Other (See Comments) 09/19/2015  . Benazepril hcl Other (See Comments) 04/26/2011  . Indomethacin Other (See Comments) 04/26/2011  . Minocycline Other (See Comments) 03/13/2012  . Pentazocine lactate Other (See Comments) 04/26/2011  . Poison ivy extract [extract of poison ivy] Rash and Other (See Comments)  05/18/2014  . Sertraline hcl Other (See Comments) 04/26/2011  . Shellfish allergy Rash 05/11/2014     Family History  Problem Relation Age of Onset  . Heart attack Father   . Stroke Father   . Allergies    . Asthma    . Heart disease    . Cancer    . Stroke Mother     Social History   Social History  . Marital Status: Married    Spouse Name: N/A  . Number of Children: 4  . Years of Education: N/A   Occupational History  . retired Administrator, [employer]     due to coronary disease in 2000  . 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 Smoking status: Former Smoker -- 28 years    Types: Pipe, Cigars    Quit date: 12/25/1995  . Smokeless tobacco: Never Used  . Alcohol Use: Yes     Comment: 3-5 beer or wine per week  . Drug Use: No  . Sexual Activity: Not on file   Other Topics Concern  . Not on file   Social History Narrative    Review of Systems: Positive = bold Gen: Denies any fever, chills, rigors, night sweats, anorexia, fatigue, weakness, malaise, involuntary weight gain, and sleep disorder CV: Denies chest pain, angina, palpitations, syncope, orthopnea, PND, peripheral edema, and claudication. Resp: Denies dyspnea, cough, sputum, wheezing, coughing up blood. GI: Described in detail in HPI.    GU : Denies urinary burning, blood in urine, urinary frequency, urinary hesitancy, nocturnal urination, and urinary incontinence. MS: Denies joint pain or swelling.  Denies muscle weakness, cramps, atrophy.  Derm: Denies rash, itching, oral ulcerations, hives, unhealing ulcers.  Psych: Denies depression, anxiety, memory loss, suicidal ideation, hallucinations,  and confusion. Heme: Denies bruising, bleeding, and enlarged lymph nodes. Neuro:  Denies any headaches, dizziness, paresthesias. Endo:  Denies any problems with DM, thyroid, adrenal function.  Physical Exam: Vital signs in last 24 hours: Temp:  [97.3 F (36.3 C)-97.9 F (36.6 C)] 97.8 F (36.6 C) (12/10 1008) Pulse Rate:  [61-101] 87 (12/10 1008) Resp:  [18-20] 18 (12/10 1008) BP:  (133-153)/(64-96) 133/64 mmHg (12/10 1008) SpO2:  [97 %-100 %] 100 % (12/10 1008) Weight:  [111.766 kg (246 lb 6.4 oz)-115.259 kg (254 lb 1.6 oz)] 111.766 kg (246 lb 6.4 oz) (12/10 0629) Last BM Date: 12/02/15 General:   Alert,  Well-developed, well-nourished, pleasant and cooperative in NAD, overweight, deconditioned-appearing, somewhat tachypneic-appearing at rest Head:  Normocephalic and atraumatic. Eyes:  Sclera clear, no icterus.   Conjunctiva pink. Ears:  Normal auditory acuity. Nose:  No deformity, discharge,  or lesions. Mouth:  No deformity or lesions.  Oropharynx pink & moist. Neck:  Thick but Supple; + JVD; no masses or thyromegaly. Lungs:  Clear throughout to auscultation.   No wheezes, crackles, or rhonchi. No acute distress. Heart:  Regular rate and irregularly irregular rhythm; no murmurs, clicks, rubs,  or gallops. Abdomen:  Soft, protuberant, non-tender, moderate-to-severe ascites with shifting dullness. No masses, hepatosplenomegaly or hernias noted. Normal bowel sounds, without guarding, and without rebound.     Msk:  Symmetrical without gross deformities. Normal posture. Pulses:  Normal pulses noted. Extremities:  Without clubbing; 3+ edema with erythema bilateral lower extremities Neurologic:  Alert and  oriented x4; diffusely weak, otherwise grossly normal neurologically. Skin:  Intact without significant lesions or rashes. Psych:  Alert and cooperative. Normal mood and affect.   Lab Results:  Recent  Labs  12/02/15 1545  WBC 5.2  HGB 12.6*  HCT 40.0  PLT 85*   BMET  Recent Labs  12/02/15 1545 12/03/15 0220  NA 138 139  K 4.6 4.1  CL 106 106  CO2 25 24  GLUCOSE 119* 84  BUN 46* 43*  CREATININE 2.66* 2.51*  CALCIUM 9.1 8.9   LFT  Recent Labs  12/02/15 1545  PROT 6.5  ALBUMIN 3.6  AST 22  ALT 15*  ALKPHOS 66  BILITOT 1.5*   PT/INR  Recent Labs  12/02/15 1545  LABPROT 18.2*  INR 1.50*    Studies/Results: Dg Chest 2  View  12/03/2015  CLINICAL DATA:  Acute on chronic diastolic congestive heart failure EXAM: CHEST  2 VIEW COMPARISON:  Radiograph 09/05/2005 FINDINGS: Left-sided pacemaker with 2 continuous leads overlies normal cardiac silhouette. There is chronic scarring centrally. No effusion, infiltrate, pneumothorax. Degenerative osteophytosis of the thoracic spine. IMPRESSION: Cardiomegaly and chronic bronchitic findings.  No acute findings. Electronically Signed   By: Suzy Bouchard M.D.   On: 12/03/2015 10:40   Impression:  1.  Diarrhea.  Ongoing for about 1.5 years, after his knee surgery.  Also seems to coincide with starting Xarelto.  Unclear etiology. 2.  Ascites, recurrent.  Possible congestive hepatopathy from cardiac disease; thrombocytopenia suggests some component of portal hypertension. 3.  Multiple medical problems.  Plan:  1.  Patient relays that his nephrologist is worried about worsening renal function with paracentesis.   Would not pursue paracentesis at the present time; ascites seems likely cardiac in nature, and, regardless, gentle diuresis and low sodium diet would be our recommendations. 2.  Will await cardiology recommendations re: whether or not to stop Xarelto (and switch to Eliquis). 3.  If switching to Eliquis, would hold off on any further medication changes, and see how things to; if not switching off Xarelto, then trial of pancreatic enzymes might be helpful. 4.  Will start diarrhea investigation from the beginning... 5.  Check GI pathogen panel, C diff PCR, giardia stool antigen; suspect these tests will be negative. 6.  Stool lactoferrin and elastase. 7.  Labs:  CRP, TSH, celiac serologies, cortisol. 8.  If labs and stool tests are unrevealing, and diarrhea persists (should Xarelto be switched to Eliquis, or should patient be placed on trial of pancreatic enzymes), patient would likely benefit from sigmoidoscopy/colonoscopy with random colon biopsies. 9.  Thank you for the  consultation.  [facility] will follow.   LOS: 1 day   Koleson Reifsteck M  12/03/2015, 1:22 PM  Pager [phone] If no answer or after 5 PM call [phone]

## 2015-12-03 NOTE — Progress Notes (Addendum)
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  Objective: I/O last 3 completed shifts: In: 240 [P.O.:240] Out: 2450 [Urine:2450] Total I/O In: 480 [P.O.:480] Out: 200 [Urine:200]   Physical Exam:  Blood pressure 133/64, pulse 87, temperature 97.8 F (36.6 C), temperature source Oral, resp. rate 18, height 5\' 11"  (1.803 m), weight 111.766 kg (246 lb 6.4 oz), SpO2 100 %. Gen: Pleasant, NAD - great conversationalist Neck: Cannot see neck veins Chest: I can hear only very faint crackles at bases posteriorly Heart: Irreg S1S2 No S3 Prosthetic valve sound. No rub Pacemaker site NT Abdomen: soft, quite protuberant, not tender and not taut Ext:1+ edema LE's Neuro: alert, Ox3   Recent Labs Lab 12/02/15 1545 12/03/15 0220  NA 138 139  K 4.6 4.1  CL 106 106  CO2 25 24  GLUCOSE 119* 84  BUN 46* 43*  CREATININE 2.66* 2.51*  CALCIUM 9.1 8.9     Recent Labs Lab 12/02/15 1545  AST 22  ALT 15*  ALKPHOS 66  BILITOT 1.5*  PROT 6.5  ALBUMIN 3.6     Recent Labs Lab 12/02/15 1545  WBC 5.2  NEUTROABS 4.2  HGB 12.6*  HCT 40.0  MCV 79.1  PLT 85*     Recent Labs Lab 12/02/15 1545 12/02/15 2353 12/03/15 0220  TROPONINI 0.06* 0.07* 0.07*     Recent Labs Lab 12/02/15 1726 12/02/15 2109 12/03/15 0614 12/03/15 1158  GLUCAP 105* 98 85 152*     Studies/Results: Dg Chest 2 View  12/03/2015  CLINICAL DATA:  Acute on chronic diastolic congestive heart failure EXAM: CHEST  2 VIEW COMPARISON:  Radiograph 09/05/2005 FINDINGS: Left-sided pacemaker with 2 continuous leads overlies normal cardiac silhouette. There is chronic scarring centrally. No effusion, infiltrate, pneumothorax. Degenerative osteophytosis of the thoracic spine. IMPRESSION: Cardiomegaly and  chronic bronchitic findings.  No acute findings. Electronically Signed   By: Suzy Bouchard M.D.   On: 12/03/2015 10:40      Current Medications . allopurinol  200 mg Oral Q breakfast  . apixaban  5 mg Oral BID  . furosemide  60 mg Intravenous Daily  . insulin aspart  0-20 Units Subcutaneous TID WC  . insulin glargine  2 Units Subcutaneous QPC breakfast  . magnesium oxide  400 mg Oral Daily  . metoprolol  75 mg Oral BID  . sodium chloride  3 mL Intravenous Q12H  . terazosin  10 mg Oral Q breakfast  . 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 Dr. 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 He was last seen by Dr. [name] on 12/7 with weight gain, CHF, worsened ascites. Creatinine at that time was 2.77. Aldactone was started, metoprolol reduced by 1/2. He was advised to see GI next week. He was then seen by Dr. Dorris Carnes 12/9 with a 12 lb weight gain on their scales, acute on chronic diastolic CHF and was admitted for diuresis as well as GI evaluation for his worsening ascites and his diarrhea. We were asked  to see and follow along. Creatinine on admission was 2.66 - in line with recent lab on 12/9 at [place].[name]   Assessment/Recommendations  1. Acute on chronic diastolic HF - volume overload, edema, 12 lb wt gain PTA. Cards started lasix 60 IV qd (home po dose only 40 mg 4 days a week). Spironolactone was held. UOP response quite good so far - 2.45 liters since admission.  2. CKD4 - creatinine on admission at most recent baseline of 2.66-2.77 (GFR around 20)  and is actually down today despite diuresis - 2.51. Continue to watch renal function with diuresis 3. Ascites -  worsened PTA? Cirrhosis. H/O fatty liver. GI to see. Recommend if paracentesis done - no more than 2 liters off and give albumin support. (He MIGHT be able to get by without paracentesis) Not yet seen by GI. No imaging studies yet. 4. AoVR and prior TV repair 5. CAD prior CABG 6. AFF - uncontrolled. BB for rate - dose increased. Xarelto has been changed to [place]. 7. Pacemaker 8. HTN - metoprolol increased back to 100 BID. BP better. 9. Thrombocytopenia.  Appears worse since September - plts at that time started running 70's-80's. ? Hypersplensim?   10. Chronic diarrhea - pt told cards diarrhea worse since xarelto 2 years ago (history he gave me was longer, seemed more related to weaning off narcotics for various pain issues). Has had outpt workup - all details not known to me.   Jamal Maes, MD [facility] [phone] Pager 12/03/2015, 12:18 PM

## 2015-12-03 NOTE — Progress Notes (Signed)
Patient ID: [name], MD, male   DOB: 1941-02-13, 74 y.o.   MRN: [id number]     Subjective:    No SOB at rest, continued DOE with exertion.   Objective:   Temp:  [97.3 F (36.3 C)-97.9 F (36.6 C)] 97.3 F (36.3 C) (12/10 0629) Pulse Rate:  [61-102] 61 (12/10 0629) Resp:  [18-20] 18 (12/10 0629) BP: (148-153)/(76-124) 148/76 mmHg (12/10 0629) SpO2:  [97 %-100 %] 98 % (12/10 0629) Weight:  [246 lb 6.4 oz (111.766 kg)-260 lb 12.8 oz (118.298 kg)] 246 lb 6.4 oz (111.766 kg) (12/10 0629) Last BM Date: 12/02/15  Filed Weights   12/02/15 1436 12/03/15 0629  Weight: 254 lb 1.6 oz (115.259 kg) 246 lb 6.4 oz (111.766 kg)    Intake/Output Summary (Last 24 hours) at 12/03/15 1027 Last data filed at 12/03/15 0536  Gross per 24 hour  Intake    240 ml  Output   2450 ml  Net  -2210 ml    Telemetry: afib, ve-pacing  Exam:  General: NAD  Resp: crackles bilateral bases  Cardiac: RRR, no m/r/g, nojvd  GI: abdomen soft, NT, ND  MSK: 2+ bilateral LE edema  Neuro: no focal deficits  Psych: appropriate affect  Lab Results:  Basic Metabolic Panel:  Recent Labs Lab 12/02/15 1545 12/03/15 0220  NA 138 139  K 4.6 4.1  CL 106 106  CO2 25 24  GLUCOSE 119* 84  BUN 46* 43*  CREATININE 2.66* 2.51*  CALCIUM 9.1 8.9    Liver Function Tests:  Recent Labs Lab 12/02/15 1545  AST 22  ALT 15*  ALKPHOS 66  BILITOT 1.5*  PROT 6.5  ALBUMIN 3.6    CBC:  Recent Labs Lab 12/02/15 1545  WBC 5.2  HGB 12.6*  HCT 40.0  MCV 79.1  PLT 85*    Cardiac Enzymes:  Recent Labs Lab 12/02/15 1545 12/02/15 2353 12/03/15 0220  TROPONINI 0.06* 0.07* 0.07*    BNP: No results for input(s): PROBNP in the last 8760 hours.  Coagulation:  Recent Labs Lab 12/02/15 1545  INR 1.50*    ECG:   Medications:   Scheduled Medications: . allopurinol  200 mg Oral Q breakfast  . furosemide  60 mg Intravenous Daily  . insulin aspart  0-20 Units Subcutaneous TID WC    . insulin glargine  2 Units Subcutaneous QPC breakfast  . magnesium oxide  400 mg Oral Daily  . metoprolol  75 mg Oral BID  . Rivaroxaban  15 mg Oral QPM  . sodium chloride  3 mL Intravenous Q12H  . terazosin  10 mg Oral Q breakfast  . zolpidem  10 mg Oral QHS     Infusions:     PRN Medications:  sodium chloride, acetaminophen, acetaminophen, diazepam, diphenoxylate-atropine, ondansetron (ZOFRAN) IV, sodium chloride     Assessment/Plan    1. Acute on chronic diastolic HF [id number] echo LVEF 50-55%, normal bioprosthetic AVR. Diastolic function not described. Very high E/e' consistent with elevated LA pressures.  - weight up 12 lbs on admission, diuresis compilcated by CKD.  - medical therapy with lopressor 75mg  bid, lasix IV 60mg  daily. No ACE, aldactone due to poor renal function.  - he is negative 2.2 liters since admission, he is on lasix IV 60mg  daily. Downtrend in Cr and BUN with diuresis consistent with venous congestion and CHF.  - he reports worst fluid build up since valve surgery in 2012. Given worsening clinical CHF, will repeat echo   2.  Coronary artery disease: S/p CABG in 2006. Continue beta blocker. He is not on ASA as he is on Xarelto.   3. History of bioprosthetic AVR - normal function by echo 08/2015  4. Sick sinus syndrome S/P placement of cardiac pacemaker: FU with EP as planned.   5. Persistent AFib/Flutter: Continue rate control with beta blocker, anticoag with coumadin - follow rates and hemodynamics, would convert to Toprol XL once numbers stable given his systolic dysfunction.   6. Chronic kidney disease (CKD), stage III (moderate): Follow renal function closely with diuresis. Nephrology to see pt .  7. Ascites: Consider paracentesis during hospital stay.From notes  Dr. Harrington Challenger was to call Dr. Oletta Lamas (GI) to apprise him of his admit. Follow up there recs. There are some noted concerns from renal about risks to kidneys with paracentesis 8.  Diarrhea:  - chronic, continue prn lomotil.  - he reports prior negative outpatient workup - he associates timing of onset 2 years ago with starting xarelto. We will try changing to eliquis.      Carlyle Dolly, M.D

## 2015-12-04 ENCOUNTER — Inpatient Hospital Stay (HOSPITAL_COMMUNITY): Payer: Medicare Other

## 2015-12-04 DIAGNOSIS — I350 Nonrheumatic aortic (valve) stenosis: Secondary | ICD-10-CM

## 2015-12-04 LAB — RENAL FUNCTION PANEL
ALBUMIN: 3.6 g/dL (ref 3.5–5.0)
ANION GAP: 10 (ref 5–15)
BUN: 38 mg/dL — ABNORMAL HIGH (ref 6–20)
CALCIUM: 9.3 mg/dL (ref 8.9–10.3)
CO2: 25 mmol/L (ref 22–32)
Chloride: 103 mmol/L (ref 101–111)
Creatinine, Ser: 2.52 mg/dL — ABNORMAL HIGH (ref 0.61–1.24)
GFR calc Af Amer: 27 mL/min — ABNORMAL LOW (ref >60)
GFR, EST NON AFRICAN AMERICAN: 23 mL/min — AB (ref >60)
GLUCOSE: 108 mg/dL — AB (ref 65–99)
Phosphorus: 3 mg/dL (ref 2.5–4.6)
Potassium: 4 mmol/L (ref 3.5–5.1)
SODIUM: 138 mmol/L (ref 135–145)

## 2015-12-04 LAB — IGG, IGA, IGM
IGG (IMMUNOGLOBIN G), SERUM: 900 mg/dL (ref 700–1600)
IgA: 345 mg/dL (ref 61–437)
IgM, Serum: 44 mg/dL (ref 15–143)

## 2015-12-04 LAB — GLUCOSE, CAPILLARY
GLUCOSE-CAPILLARY: 107 mg/dL — AB (ref 65–99)
GLUCOSE-CAPILLARY: 105 mg/dL — AB (ref 65–99)
Glucose-Capillary: 96 mg/dL (ref 65–99)
Glucose-Capillary: 122 mg/dL — ABNORMAL HIGH (ref 65–99)

## 2015-12-04 MED ORDER — PERFLUTREN LIPID MICROSPHERE
1.0000 mL | INTRAVENOUS | Status: AC | PRN
  Administered 2015-12-04: 3 mL via INTRAVENOUS
  Filled 2015-12-04: qty 10

## 2015-12-04 NOTE — Progress Notes (Signed)
Patient ID: [name], MD, male   DOB: 14-Aug-1941, 74 y.o.   MRN: [id number]      Subjective:    Diarrhea continues  Objective:   Temp:  [97.7 F (36.5 C)-97.8 F (36.6 C)] 97.7 F (36.5 C) (12/11 0405) Pulse Rate:  [61-87] 69 (12/11 0405) Resp:  [18] 18 (12/11 0405) BP: (132-157)/(64-90) 144/90 mmHg (12/11 0405) SpO2:  [95 %-100 %] 98 % (12/11 0405) Weight:  [238 lb 11.2 oz (108.274 kg)] 238 lb 11.2 oz (108.274 kg) (12/11 0405) Last BM Date: 12/03/15  Filed Weights   12/02/15 1436 12/03/15 0629 12/04/15 0405  Weight: 254 lb 1.6 oz (115.259 kg) 246 lb 6.4 oz (111.766 kg) 238 lb 11.2 oz (108.274 kg)    Intake/Output Summary (Last 24 hours) at 12/04/15 0829 Last data filed at 12/04/15 0500  Gross per 24 hour  Intake    600 ml  Output   1976 ml  Net  -1376 ml    Telemetry: Afib  Exam:  General: NAD  Resp: mild crackles bilateral bases  Cardiac: irreg, no m/r/g  [id number] soft, NT  MSK: 1+ bilateral LE edema  Neuro: no focal deficits  Psych: appropriate affect  Lab Results:  Basic Metabolic Panel:  Recent Labs Lab 12/02/15 1545 12/03/15 0220 12/04/15 0350  NA 138 139 138  K 4.6 4.1 4.0  CL 106 106 103  CO2 25 24 25   GLUCOSE 119* 84 108*  BUN 46* 43* 38*  CREATININE 2.66* 2.51* 2.52*  CALCIUM 9.1 8.9 9.3    Liver Function Tests:  Recent Labs Lab 12/02/15 1545 12/04/15 0350  AST 22  --   ALT 15*  --   ALKPHOS 66  --   BILITOT 1.5*  --   PROT 6.5  --   ALBUMIN 3.6 3.6    CBC:  Recent Labs Lab 12/02/15 1545  WBC 5.2  HGB 12.6*  HCT 40.0  MCV 79.1  PLT 85*    Cardiac Enzymes:  Recent Labs Lab 12/02/15 1545 12/02/15 2353 12/03/15 0220  TROPONINI 0.06* 0.07* 0.07*    BNP: No results for input(s): PROBNP in the last 8760 hours.  Coagulation:  Recent Labs Lab 12/02/15 1545  INR 1.50*    ECG:   Medications:   Scheduled Medications: . allopurinol  200 mg Oral Q breakfast  . apixaban  5 mg Oral BID    . furosemide  60 mg Intravenous Daily  . insulin aspart  0-20 Units Subcutaneous TID WC  . insulin glargine  2 Units Subcutaneous QPC breakfast  . magnesium oxide  400 mg Oral Daily  . metoprolol  75 mg Oral BID  . sodium chloride  3 mL Intravenous Q12H  . terazosin  10 mg Oral Q breakfast  . zolpidem  10 mg Oral QHS     Infusions:     PRN Medications:  sodium chloride, acetaminophen, acetaminophen, diazepam, diphenoxylate-atropine, ondansetron (ZOFRAN) IV, sodium chloride     Assessment/Plan    1. Acute on chronic diastolic HF [id number] echo LVEF 50-55%, normal bioprosthetic AVR. Diastolic function not described. Very high E/e' consistent with elevated LA pressures.  - weight up 12 lbs on admission, diuresis compilcated by CKD.  - medical therapy with lopressor 75mg  bid, lasix IV 60mg  daily. No ACE, aldactone due to poor renal function.  - he is negative 1.3 liters yesterday and  3.5 liters since admission, he is on lasix IV 60mg  daily. Cr and BUN remain stable.  - he reports  worst fluid build up since valve surgery in 2012. Given worsening clinical CHF, we are awaiting a repeat echo   2. Coronary artery disease: S/p CABG in 2006. Continue beta blocker. He is not on ASA as he is on anticoag   3. History of bioprosthetic AVR - normal function by echo 08/2015  4. Sick sinus syndrome S/P placement of cardiac pacemaker: FU with EP as planned.   5. Persistent AFib/Flutter: Continue rate control with beta blocker, anticoag with eliquis. Patient related onset of diarrhea with starting xarelto about 2 years ago, we have tried changing to eliquis.    6. Chronic kidney disease (CKD), stage III (moderate): Follow renal function closely with diuresis. Nephrology to see pt .  7. Ascites: - will not pursue paracentesis per per GI recs, manage with diuretics at this time. Primary concern is worsening renal function with rapid fluid shifts.    8. Diarrhea:  - chronic,  continue prn lomotil.  - he reports prior negative outpatient workup - he associates timing of onset 2 years ago with starting xarelto. We will try changing to eliquis.  - seen by GI as inpatient, undergoing further evaluation        Carlyle Dolly, M.D.

## 2015-12-04 NOTE — Progress Notes (Signed)
[name] Rounding Note  Subjective: Diarrhea main complaint Diuresing well with 60 mg IV lasix - just getting once a day (only takes 40 po 4 days a week as outpt) GI re-evaluating his diarrhea (note reviewed)  Objective:  I/O last 3 completed shifts: In: 840 [P.O.:840] Out: [id number] [Urine:4175; Stool:1]     Physical Exam:  Blood pressure 144/90, pulse 69, temperature 97.7 F (36.5 C), temperature source Oral, resp. rate 18, height 5\' 11"  (1.803 m), weight 108.274 kg (238 lb 11.2 oz), SpO2 98 %.  Gen: Pleasant, NAD  Chest: Clear Heart: Irreg S1S2 No S3 Prosthetic valve sound. No rub Pacemaker site NT Abdomen: soft, quite protuberant, not tender and not taut Ext:1+ edema LE's no real change Neuro: alert, Ox3  Weight trending: 12/9   115.2 kg (254 lb) 12/10  111.76 kg (246 lb) 12/11 108.27 kg  (238 lb)    Recent Labs Lab 12/02/15 1545 12/03/15 0220 12/04/15 0350  NA 138 139 138  K 4.6 4.1 4.0  CL 106 106 103  CO2 25 24 25   GLUCOSE 119* 84 108*  BUN 46* 43* 38*  CREATININE 2.66* 2.51* 2.52*  CALCIUM 9.1 8.9 9.3  PHOS  --   --  3.0     Recent Labs Lab 12/02/15 1545 12/04/15 0350  AST 22  --   ALT 15*  --   ALKPHOS 66  --   BILITOT 1.5*  --   PROT 6.5  --   ALBUMIN 3.6 3.6     Recent Labs Lab 12/02/15 1545  WBC 5.2  NEUTROABS 4.2  HGB 12.6*  HCT 40.0  MCV 79.1  PLT 85*     Recent Labs Lab 12/02/15 1545 12/02/15 2353 12/03/15 0220  TROPONINI 0.06* 0.07* 0.07*     Recent Labs Lab 12/03/15 1158 12/03/15 1650 12/03/15 2048 12/04/15 0556 12/04/15 1244  GLUCAP 152* 137* 139* 96 122*     Studies/Results: Dg Chest 2 View  12/03/2015  CLINICAL DATA:  Acute on chronic diastolic congestive heart failure EXAM: CHEST  2 VIEW COMPARISON:  Radiograph 09/05/2005 FINDINGS: Left-sided pacemaker with 2 continuous leads overlies normal cardiac silhouette. There is chronic scarring centrally. No effusion, infiltrate, pneumothorax.  Degenerative osteophytosis of the thoracic spine. IMPRESSION: Cardiomegaly and chronic bronchitic findings.  No acute findings. Electronically Signed   By: Suzy Bouchard M.D.   On: 12/03/2015 10:40      Current Medications . allopurinol  200 mg Oral Q breakfast  . apixaban  5 mg Oral BID  . furosemide  60 mg Intravenous Daily  . insulin aspart  0-20 Units Subcutaneous TID WC  . insulin glargine  2 Units Subcutaneous QPC breakfast  . magnesium oxide  400 mg Oral Daily  . metoprolol  75 mg Oral BID  . sodium chloride  3 mL Intravenous Q12H  . terazosin  10 mg Oral Q breakfast  . 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 Dr. 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 He was last seen by Dr. [name] on 12/7 with weight gain, CHF, worsened ascites. Creatinine at that time was 2.77. Aldactone was started, metoprolol reduced by 1/2. He was advised to see GI next week. He was then seen by  Dr. Dorris Carnes 12/9 with a 12 lb weight gain on their scales, acute on chronic diastolic CHF and was admitted for diuresis as well as GI evaluation for his worsening ascites and his diarrhea. We were asked to see and follow along. Creatinine on admission was 2.66 - in line with recent lab on 12/9 at [name]   Assessment/Recommendations  1. Acute on chronic diastolic HF - volume overload, edema, 12 lb wt gain PTA. Cards started lasix 60 IV qd (home po dose only 40 mg 4 days a week). Spironolactone was held. UOP response quite good so far . Weight down 16 lb with stable renal function.  2. 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 (2.5)  Continue to watch renal function with diuresis 3. Diarrhea - chronic  - Pt told cards diarrhea worse since xarelto 2 years ago. Xarelto was changed to Eliquis.  Dr. Paulita Fujita re-evaluating. GI pathogen panel, elastase, lactoferrin, celiac serologies all still pending, with colonoscopy/random biopsies a consideration if labs unrevealing..   4. Ascites - worsened PTA.  Cirrhosis. H/O fatty liver. GI feels paracentesis not needed at this point as pt slowly losing fluid with diuretics. 5. AoVR and prior TV repair 6. CAD prior CABG 7. AFF - uncontrolled. BB for rate - dose increased. Xarelto has been changed to [place]. 8. Pacemaker 9. HTN - metoprolol increased  to 75 BID. BP better. 10. Thrombocytopenia.  Appears worse since September - plts at that time started running 70's-80's. ? Hypersplensim?    [name] Maes, MD [facility] [phone] Pager 12/04/2015, 1:37 PM

## 2015-12-04 NOTE — Progress Notes (Signed)
  Echocardiogram 2D Echocardiogram has been performed.  Donata Clay 12/04/2015, 12:16 PM

## 2015-12-04 NOTE — Progress Notes (Signed)
Subjective: Stopped Xarelto yesterday. Only couple diarrheal stools in past 24 hours. Decrease in borborygmi.  Objective: Vital signs in last 24 hours: Temp:  [97.7 F (36.5 C)-97.8 F (36.6 C)] 97.7 F (36.5 C) (12/11 0405) Pulse Rate:  [61-69] 69 (12/11 0405) Resp:  [18] 18 (12/11 0405) BP: (132-157)/(71-90) 144/90 mmHg (12/11 0405) SpO2:  [95 %-98 %] 98 % (12/11 0405) Weight:  [108.274 kg (238 lb 11.2 oz)] 108.274 kg (238 lb 11.2 oz) (12/11 0405) Weight change: -6.985 kg (-15 lb 6.4 oz) Last BM Date: 12/03/15  PE: GEN:  Obese, deconditioned-appearing, NAD ABD:  Distended with ascites, non-tender  Lab Results: CBC    Component Value Date/Time   WBC 5.2 12/02/2015 1545   WBC 5.6 12/02/2007 0935   RBC 5.06 12/02/2015 1545   RBC 3.67* 12/02/2007 0935   HGB 12.6* 12/02/2015 1545   HGB 11.2* 12/02/2007 0935   HCT 40.0 12/02/2015 1545   HCT 31.9* 12/02/2007 0935   PLT 85* 12/02/2015 1545   PLT 166 12/02/2007 0935   MCV 79.1 12/02/2015 1545   MCV 87.0 12/02/2007 0935   MCH 24.9* 12/02/2015 1545   MCH 30.6 12/02/2007 0935   MCHC 31.5 12/02/2015 1545   MCHC 35.2 12/02/2007 0935   RDW 20.8* 12/02/2015 1545   RDW 16.6* 12/02/2007 0935   LYMPHSABS 0.5* 12/02/2015 1545   LYMPHSABS 0.4* 12/02/2007 0935   MONOABS 0.5 12/02/2015 1545   MONOABS 0.5 12/02/2007 0935   EOSABS 0.1 12/02/2015 1545   EOSABS 0.0 12/02/2007 0935   BASOSABS 0.0 12/02/2015 1545   BASOSABS 0.0 12/02/2007 0935   CMP     Component Value Date/Time   NA 138 12/04/2015 0350   K 4.0 12/04/2015 0350   CL 103 12/04/2015 0350   CO2 25 12/04/2015 0350   GLUCOSE 108* 12/04/2015 0350   BUN 38* 12/04/2015 0350   CREATININE 2.52* 12/04/2015 0350   CALCIUM 9.3 12/04/2015 0350   CALCIUM 8.1* 06/16/2011 0300   PROT 6.5 12/02/2015 1545   ALBUMIN 3.6 12/04/2015 0350   AST 22 12/02/2015 1545   ALT 15* 12/02/2015 1545   ALKPHOS 66 12/02/2015 1545   BILITOT 1.5* 12/02/2015 1545   GFRNONAA 23* 12/04/2015 0350    GFRAA 27* 12/04/2015 0350   TSH normal ESR normal C. Diff negative.  Assessment:  1.  Diarrhea.  Unclear etiology.  Some improvement since Xarelto was discontinued.  Refractory to Lomotil, loperamide, Florastor, Metamucil. 2.  Ascites. 3.  Multiple medical problems.  Plan:  1.  Awaiting more stool studies (GI pathogen panel; elastase; lactoferrin). 2.  Awaiting more labs (celiac serologies). 3.  If diarrhea persists, and stool/lab testing is unrevealing, would consider sigmoidoscopy/colonoscopy with random colon biopsies to assess for microscopic colitis (if no other macroscopic explanation for diarrhea is identified. 4.  Will follow.   Landry Dyke 12/04/2015, 11:04 AM   Pager [phone] If no answer or after 5 PM call [phone]

## 2015-12-05 DIAGNOSIS — I5043 Acute on chronic combined systolic (congestive) and diastolic (congestive) heart failure: Secondary | ICD-10-CM

## 2015-12-05 LAB — RENAL FUNCTION PANEL
ALBUMIN: 3.4 g/dL — AB (ref 3.5–5.0)
Anion gap: 10 (ref 5–15)
BUN: 32 mg/dL — AB (ref 6–20)
CHLORIDE: 100 mmol/L — AB (ref 101–111)
CO2: 27 mmol/L (ref 22–32)
CREATININE: 2.34 mg/dL — AB (ref 0.61–1.24)
Calcium: 9.2 mg/dL (ref 8.9–10.3)
GFR, EST AFRICAN AMERICAN: 29 mL/min — AB (ref >60)
GFR, EST NON AFRICAN AMERICAN: 25 mL/min — AB (ref >60)
Glucose, Bld: 87 mg/dL (ref 65–99)
PHOSPHORUS: 3.1 mg/dL (ref 2.5–4.6)
POTASSIUM: 3.4 mmol/L — AB (ref 3.5–5.1)
Sodium: 137 mmol/L (ref 135–145)

## 2015-12-05 LAB — CBC
HEMATOCRIT: 41.4 % (ref 39.0–52.0)
Hemoglobin: 13.1 g/dL (ref 13.0–17.0)
MCH: 25.1 pg — ABNORMAL LOW (ref 26.0–34.0)
MCHC: 31.6 g/dL (ref 30.0–36.0)
MCV: 79.5 fL (ref 78.0–100.0)
Platelets: 87 10*3/uL — ABNORMAL LOW (ref 150–400)
RBC: 5.21 MIL/uL (ref 4.22–5.81)
RDW: 20.7 % — AB (ref 11.5–15.5)
WBC: 5.4 10*3/uL (ref 4.0–10.5)

## 2015-12-05 LAB — GLUCOSE, CAPILLARY
GLUCOSE-CAPILLARY: 139 mg/dL — AB (ref 65–99)
GLUCOSE-CAPILLARY: 98 mg/dL (ref 65–99)
GLUCOSE-CAPILLARY: 149 mg/dL — AB (ref 65–99)
Glucose-Capillary: 87 mg/dL (ref 65–99)

## 2015-12-05 LAB — TISSUE TRANSGLUTAMINASE, IGA: Tissue Transglutaminase Ab, IgA: <2 U/mL (ref 0–3)

## 2015-12-05 MED ORDER — MUPIROCIN CALCIUM 2 % EX CREA
TOPICAL_CREAM | Freq: Two times a day (BID) | CUTANEOUS | Status: DC
  Administered 2015-12-05 – 2015-12-06 (×4): via TOPICAL
  Filled 2015-12-05 (×2): qty 15

## 2015-12-05 MED ORDER — APIXABAN 5 MG PO TABS
5.0000 mg | ORAL_TABLET | Freq: Two times a day (BID) | ORAL | Status: AC
Start: 2015-12-05 — End: 2015-12-05
  Administered 2015-12-05: 5 mg via ORAL
  Filled 2015-12-05: qty 1

## 2015-12-05 NOTE — Evaluation (Signed)
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 He is on long-term anticoagulation with Xarelto.  Admitted with fluid overload due to [name] and diarrhea.   Clinical Impression  Patient presents at functional independent level with mobility including bed mobility, transfers, ambulation and stair negotiation.  Feel he is safe for in hallway ambulation and encouraged 3 x daily ambulation in hallway for improving activity tolerance and to aide in getting home soon.  Patient's wife arrived during evaluation and reports pt difficult to motivate at times for increasing activity level.  Feel no further skilled PT indicated at this time.  Will sign off, but recommend frequent nursing encouragement for mobility in hallway.    Follow Up Recommendations No PT follow up    Equipment Recommendations  None recommended by PT    Recommendations for Other Services       Precautions / Restrictions Precautions Precaution Comments: enteric contact isolation      Mobility  Bed Mobility Overal bed mobility: Modified Independent                Transfers Overall transfer level: Modified independent                  Ambulation/Gait Ambulation/Gait assistance: Independent Ambulation Distance (Feet): 250 Feet Assistive device: None Gait Pattern/deviations: Step-through pattern     General Gait Details: decreased R knee flexion with hip hiking to clear  Stairs Stairs: Yes Stairs assistance: Supervision Stair Management: One rail Right;Alternating pattern Number of Stairs: 4 General stair comments: supervision for safety, no loss  of balance  Wheelchair Mobility    Modified Rankin (Stroke Patients Only)       Balance Overall balance assessment: Needs assistance   Sitting balance-Leahy Scale: Normal       Standing balance-Leahy Scale: Good                 High Level Balance Comments: able to pick up item from floor, to reach >10" forward, to pick up item from floor             Pertinent Vitals/Pain Pain Assessment: 0-10 Pain Score: 3  Pain Location: headache Pain Descriptors / Indicators: Headache Pain Intervention(s): Monitored during session    Home Living Family/patient expects to be discharged to:: Private residence Living Arrangements: Spouse/significant other Available Help at Discharge: Family Type of Home: House Home Access: Stairs to enter Entrance Stairs-Rails: Right Entrance Stairs-Number of Steps: 3-4 Home Layout: Two level;Able to live on main level with bedroom/bathroom Home Equipment: Gilford Rile - 2 wheels;Cane - single point;Shower seat;Grab bars - toilet;Bedside commode;Transport chair      Prior Function Level of Independence: Independent               Hand Dominance        Extremity/Trunk Assessment   Upper Extremity Assessment: Overall WFL for tasks assessed           Lower Extremity Assessment: Overall WFL for tasks assessed         Communication   Communication: No difficulties  Cognition Arousal/Alertness: Awake/alert Behavior During Therapy: WFL for tasks assessed/performed Overall Cognitive Status: Within Functional Limits for  tasks assessed                      General Comments      Exercises        Assessment/Plan    PT Assessment Patent does not need any further PT services  PT Diagnosis Generalized weakness   PT Problem List    PT Treatment Interventions     PT Goals (Current goals can be found in the Care Plan section) Acute Rehab PT Goals PT Goal Formulation: All assessment and education complete, DC  therapy    Frequency     Barriers to discharge        Co-evaluation               End of Session   Activity Tolerance: Patient tolerated treatment well Patient left: in bed;with call bell/phone within reach;with family/visitor present           Time: 0952-1020 PT Time Calculation (min) (ACUTE ONLY): 28 min   Charges:   PT Evaluation $Initial PT Evaluation Tier I: 1 Procedure PT Treatments $Self Care/Home Management: 8-22   PT G Codes:        [name],[name] Dec 10, 2015, 10:42 AM  [name], [name] 12/10/2015

## 2015-12-05 NOTE — Progress Notes (Signed)
Patient ID: [name], MD, male   DOB: 06/03/1941, 74 y.o.   MRN: [id number] [facility] Progress Note  Marylynn Pearson II, MD 74 y.o. Sep 14, 1941   Subjective: Continues to have nonbloody loose stools. Denies SOB. Wife at bedside.  Objective: Vital signs in last 24 hours: Filed Vitals:   12/05/15 0952 12/05/15 1038  BP: 156/75   Pulse: 71 105  Temp  97.7   Resp:       Physical Exam: Gen: alert, no acute distress, obese CV: irregular Chest: CTA B Abd: Distended, tender in LUQ otherwise nontender, +BS Ext: no edema  Lab Results:  Recent Labs  12/04/15 0350 12/05/15 0423  NA 138 137  K 4.0 3.4*  CL 103 100*  CO2 25 27  GLUCOSE 108* 87  BUN 38* 32*  CREATININE 2.52* 2.34*  CALCIUM 9.3 9.2  PHOS 3.0 3.1    Recent Labs  12/02/15 1545 12/04/15 0350 12/05/15 0423  AST 22  --   --   ALT 15*  --   --   ALKPHOS 66  --   --   BILITOT 1.5*  --   --   PROT 6.5  --   --   ALBUMIN 3.6 3.6 3.4*    Recent Labs  12/02/15 1545 12/05/15 1042  WBC 5.2 5.4  NEUTROABS 4.2  --   HGB 12.6* 13.1  HCT 40.0 41.4  MCV 79.1 79.5  PLT 85* 87*    Recent Labs  12/02/15 1545  LABPROT 18.2*  INR 1.50*      Assessment/Plan: 74 yo with CHF, Afib, pulm HTN with ascites likely due to CHF but could have NASH cirrhosis as well. Will do a noncontrast CT scan to further evaluate liver. Low platelets likely multifactorial but could be due to portal HTN. Chronic diarrhea (since 7/15 that is worse over the past month) and C. Diff negative. Doubt infection but GI pathogen panel pending. Will plan to do flex sig on Wednesday 12/07/15 and hold Eliquis tomorrow in anticipation of flex sig to allow biopsies to be done.   [place] C. 12/05/2015, 2:29 PM  Pager [phone]  If no answer or after 5 PM call [phone]

## 2015-12-05 NOTE — Progress Notes (Signed)
Patient ID: [name] Fiddler, MD, male   DOB: 10-04-1941, 74 y.o.   MRN: [id number] S:volume is better but still c/o diarrhea O:BP 143/89 mmHg  Pulse 68  Temp(Src) 97.7 F (36.5 C) (Oral)  Resp 16  Ht 5\' 11"  (1.803 m)  Wt 105.416 kg (232 lb 6.4 oz)  BMI 32.43 kg/m2  SpO2 97%  Intake/Output Summary (Last 24 hours) at 12/05/15 0821 Last data filed at 12/05/15 0818  Gross per 24 hour  Intake    680 ml  Output   3226 ml  Net  -2546 ml   Intake/Output: I/O last 3 completed shifts: In: 71 [P.O.:560] Out: 4101 [Urine:4100; Stool:1]  Intake/Output this shift:  Total I/O In: 240 [P.O.:240] Out: -  Weight change: -2.858 kg (-6 lb 4.8 oz) Gen:WD obese WM in NAD CVS:no rub Resp:cta [id number] abd, soft, +Fluid wave, NT, +BS [id number] pretibial edema   Recent Labs Lab 12/02/15 1545 12/03/15 0220 12/04/15 0350 12/05/15 0423  NA 138 139 138 137  K 4.6 4.1 4.0 3.4*  CL 106 106 103 100*  CO2 25 24 25 27   GLUCOSE 119* 84 108* 87  BUN 46* 43* 38* 32*  CREATININE 2.66* 2.51* 2.52* 2.34*  ALBUMIN 3.6  --  3.6 3.4*  CALCIUM 9.1 8.9 9.3 9.2  PHOS  --   --  3.0 3.1  AST 22  --   --   --   ALT 15*  --   --   --    Liver Function Tests:  Recent Labs Lab 12/02/15 1545 12/04/15 0350 12/05/15 0423  AST 22  --   --   ALT 15*  --   --   ALKPHOS 66  --   --   BILITOT 1.5*  --   --   PROT 6.5  --   --   ALBUMIN 3.6 3.6 3.4*   No results for input(s): LIPASE, AMYLASE in the last 168 hours. No results for input(s): AMMONIA in the last 168 hours. CBC:  Recent Labs Lab 12/02/15 1545 12/05/15 1042  WBC 5.2 5.4  NEUTROABS 4.2  --   HGB 12.6* 13.1  HCT 40.0 41.4  MCV 79.1 79.5  PLT 85* 87*   Cardiac Enzymes:  Recent Labs Lab 12/02/15 1545 12/02/15 2353 12/03/15 0220  TROPONINI 0.06* 0.07* 0.07*   CBG:  Recent Labs Lab 12/04/15 0556 12/04/15 1244 12/04/15 1656 12/04/15 2140 12/05/15 0658  GLUCAP 96 122* 107* 105* 87    Iron Studies: No results for  input(s): IRON, TIBC, TRANSFERRIN, FERRITIN in the last 72 hours. Studies/Results: Dg Chest 2 View  12/03/2015  CLINICAL DATA:  Acute on chronic diastolic congestive heart failure EXAM: CHEST  2 VIEW COMPARISON:  Radiograph 09/05/2005 FINDINGS: Left-sided pacemaker with 2 continuous leads overlies normal cardiac silhouette. There is chronic scarring centrally. No effusion, infiltrate, pneumothorax. Degenerative osteophytosis of the thoracic spine. IMPRESSION: Cardiomegaly and chronic bronchitic findings.  No acute findings. Electronically Signed   By: Suzy Bouchard M.D.   On: 12/03/2015 10:40   . allopurinol  200 mg Oral Q breakfast  . apixaban  5 mg Oral BID  . furosemide  60 mg Intravenous Daily  . insulin aspart  0-20 Units Subcutaneous TID WC  . insulin glargine  2 Units Subcutaneous QPC breakfast  . magnesium oxide  400 mg Oral Daily  . metoprolol  75 mg Oral BID  . sodium chloride  3 mL Intravenous Q12H  . terazosin  10 mg Oral Q breakfast  .  zolpidem  10 mg Oral QHS    BMET    Component Value Date/Time   NA 137 12/05/2015 0423   K 3.4* 12/05/2015 0423   CL 100* 12/05/2015 0423   CO2 27 12/05/2015 0423   GLUCOSE 87 12/05/2015 0423   BUN 32* 12/05/2015 0423   CREATININE 2.34* 12/05/2015 0423   CALCIUM 9.2 12/05/2015 0423   CALCIUM 8.1* 06/16/2011 0300   GFRNONAA 25* 12/05/2015 0423   GFRAA 29* 12/05/2015 0423   CBC    Component Value Date/Time   WBC 5.2 12/02/2015 1545   WBC 5.6 12/02/2007 0935   RBC 5.06 12/02/2015 1545   RBC 3.67* 12/02/2007 0935   HGB 12.6* 12/02/2015 1545   HGB 11.2* 12/02/2007 0935   HCT 40.0 12/02/2015 1545   HCT 31.9* 12/02/2007 0935   PLT 85* 12/02/2015 1545   PLT 166 12/02/2007 0935   MCV 79.1 12/02/2015 1545   MCV 87.0 12/02/2007 0935   MCH 24.9* 12/02/2015 1545   MCH 30.6 12/02/2007 0935   MCHC 31.5 12/02/2015 1545   MCHC 35.2 12/02/2007 0935   RDW 20.8* 12/02/2015 1545   RDW 16.6* 12/02/2007 0935   LYMPHSABS 0.5* 12/02/2015  1545   LYMPHSABS 0.4* 12/02/2007 0935   MONOABS 0.5 12/02/2015 1545   MONOABS 0.5 12/02/2007 0935   EOSABS 0.1 12/02/2015 1545   EOSABS 0.0 12/02/2007 0935   BASOSABS 0.0 12/02/2015 1545   BASOSABS 0.0 12/02/2007 0935   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 Dr. 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 He was last seen by Dr. [name] on 12/7 with weight gain, CHF, worsened ascites. Creatinine at that time was 2.77. Aldactone was started, metoprolol reduced by 1/2. He was advised to see GI next week. He was then seen by Dr. Dorris Carnes 12/9 with a 12 lb weight gain on their scales, acute on chronic diastolic CHF and was admitted for diuresis as well as GI evaluation for his worsening ascites and his diarrhea. We were asked to see and follow along. Creatinine on admission was 2.66 - in line with recent lab on 12/9 at [place].[name]    Assessment/Plan:  1. AKI/CKD due to decompensated CHF.  Now back at baseline with marked diuresis.  Will plan on switching over to po lasix tomorrow. 2. Acute on chronic CHF- ECHO with decreased EF so systolic and diastolic. Diuresing well. 3. Diarrhea-  w/u per GI, C. Diff negative. 4. Ascites- ?due to CHF vs possible cirrhosis due to fatty liver vs etoh vs meds.  Paracentesis on hold per GI but given thrombocytopenia would recommend imaging of liver and spleen 5. Thrombocytopenia- has been intermittent over the past 6-8 years and not sure of etiology.  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  Consider Heme evaluation.  6. AVR - stable 7. CAD s/p CABG- stable 8. A. Fib- rate controlled 9. pacemaker - stable  Daishawn Lauf A

## 2015-12-05 NOTE — Care Management Important Message (Signed)
Important Message  Patient Details  Name: [name] MAJID, MD MRN: [id number] Date of Birth: 07-Aug-1941   Medicare Important Message Given:  Yes    Erenest Rasher, RN 12/05/2015, 1:09 PM

## 2015-12-05 NOTE — Progress Notes (Signed)
Patient ID: [name] Fiddler, MD, male   DOB: [birth year], 74 y.o.   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 9.  Is getting diuresed - I/O net - 6.6 liters so far this admission  Creatinine is improving    Objective:   Temp:  [97.7 F (36.5 C)-98.4 F (36.9 C)] 97.7 F (36.5 C) (12/12 0455) Pulse Rate:  [68-105] 105 (12/12 1038) Resp:  [16-18] 16 (12/12 0455) BP: (143-160)/(75-89) 156/75 mmHg (12/12 0952) SpO2:  [93 %-97 %] 93 % (12/12 1038) Weight:  [232 lb 6.4 oz (105.416 kg)] 232 lb 6.4 oz (105.416 kg) (12/12 0455) Last BM Date: 12/04/15  Filed Weights   12/03/15 0629 12/04/15 0405 12/05/15 0455  Weight: 246 lb 6.4 oz (111.766 kg) 238 lb 11.2 oz (108.274 kg) 232 lb 6.4 oz (105.416 kg)    Intake/Output Summary (Last 24 hours) at 12/05/15 1320 Last data filed at 12/05/15 1158  Gross per 24 hour  Intake    440 ml  Output   3526 ml  Net  -3086 ml    Telemetry: Afib  Exam:  General: NAD  Resp: mild crackles bilateral bases  Cardiac: irreg, no m/r/g  [id number] soft, NT  MSK: 1+ bilateral LE edema  Neuro: no focal deficits  Psych: appropriate affect  Lab Results:  Basic Metabolic Panel:  Recent Labs Lab 12/03/15 0220 12/04/15 0350 12/05/15 0423  NA 139 138 137  K 4.1 4.0 3.4*  CL 106 103 100*  CO2 24 25 27   GLUCOSE 84 108* 87  BUN 43* 38* 32*  CREATININE 2.51* 2.52* 2.34*  CALCIUM 8.9 9.3 9.2    Liver Function Tests:  Recent Labs Lab 12/02/15 1545 12/04/15 0350 12/05/15 0423  AST 22  --   --   ALT 15*  --   --   ALKPHOS 66  --   --   BILITOT 1.5*  --   --   PROT 6.5  --   --   ALBUMIN 3.6 3.6 3.4*    CBC:  Recent Labs Lab 12/02/15 1545 12/05/15 1042  WBC 5.2 5.4  HGB 12.6* 13.1  HCT  40.0 41.4  MCV 79.1 79.5  PLT 85* 87*    Cardiac Enzymes:  Recent Labs Lab 12/02/15 1545 12/02/15 2353 12/03/15 0220  TROPONINI 0.06* 0.07* 0.07*    BNP: No results for input(s): PROBNP in the last 8760 hours.  Coagulation:  Recent Labs Lab 12/02/15 1545  INR 1.50*    ECG:   Medications:   Scheduled Medications: . allopurinol  200 mg Oral Q breakfast  . apixaban  5 mg Oral BID  . furosemide  60 mg Intravenous Daily  . insulin aspart  0-20 Units Subcutaneous TID WC  . insulin glargine  2 Units Subcutaneous QPC breakfast  . magnesium oxide  400 mg Oral Daily  . metoprolol  75 mg Oral BID  . sodium chloride  3 mL Intravenous Q12H  . terazosin  10 mg Oral Q breakfast  . zolpidem  10 mg Oral QHS    Infusions:    PRN Medications: sodium chloride, acetaminophen, acetaminophen, diazepam, diphenoxylate-atropine, ondansetron (ZOFRAN) IV, sodium chloride     Assessment/Plan    1. Acute on chronic combned systolic and diastolic  HF 08/2015 echo LVEF 50-55%, normal bioprosthetic AVR. Diastolic function not described. Very high E/e' consistent with elevated LA pressures. Echo yeserday shows EF ~ 35- 40%,  Mild - moderate RV enlargement with mild - moderate pulmonary artery HTH Continue with IV lasix   , Pt. Asked about Digoxin - we discussed the issues with Digoxin - would be difficult to dose,  Will try to avoid starting at this point .  Afib rate is well controlled.   Appreciate assistance of Dr. Ambrose Pancoast.    2. Coronary artery disease: S/p CABG in 2006. Continue beta blocker. He is not on ASA as he is on anticoag No angian    3. History of bioprosthetic AVR   4. Sick sinus syndrome S/P placement of cardiac pacemaker: FU with EP as planned.   5. Persistent AFib/Flutter: Continue rate control with beta blocker, anticoag with eliquis. Patient related onset of diarrhea with starting xarelto about 2 years ago, we have tried changing to eliquis.    6.  Chronic kidney disease (CKD), stage III (moderate): Follow renal function closely with diuresis.   7. Ascites: - will not pursue paracentesis per per GI recs, manage with diuretics at this time. Primary concern is worsening renal function with rapid fluid shifts.    8. Pulmnary HTN - mild mod.  Estimated PA pressure of 39 by recent echo.    [name], [name] Cheng, MD  12/05/2015 1:52 PM    [name],  [name], [name]  [id number] Pager [phone] Phone: [phone]; Fax: [phone]   [facility]  [address] [place], [place]  [address] [phone]   Fax [phone]

## 2015-12-05 NOTE — Care Management Note (Signed)
Case Management Note  Patient Details  Name: [name] BADA, MD MRN: [id number] Date of Birth: [birth year]  Subjective/Objective:          CHF          Action/Plan: NCM spoke to pt and gave permission to also speak to wife. Provided pt with Eliquis 30 day free trial card to use at the pharmacy. Wife reports pt is currently in the donut hole and paying $350 out of pocket for his medications.   Expected Discharge Date:  12/06/2015            Expected Discharge Plan:  Home/Self Care  In-House Referral:  NA  Discharge planning Services  CM Consult, Medication Assistance  Post Acute Care Choice:  NA Choice offered to:  NA  DME Arranged:  N/A DME Agency:  NA  HH Arranged:  NA HH Agency:  NA  Status of Service:  Completed, signed off  Medicare Important Message Given:  Yes Date Medicare IM Given:    Medicare IM give by:    Date Additional Medicare IM Given:    Additional Medicare Important Message give by:     If discussed at [place] of Stay Meetings, dates discussed:    Additional Comments:  Erenest Rasher, RN 12/05/2015, 1:10 PM

## 2015-12-05 NOTE — Progress Notes (Signed)
MD, could you order some Hydrocortisone cream for irritation of the skin, thanks

## 2015-12-06 ENCOUNTER — Inpatient Hospital Stay (HOSPITAL_COMMUNITY): Payer: Medicare Other

## 2015-12-06 DIAGNOSIS — I483 Typical atrial flutter: Secondary | ICD-10-CM

## 2015-12-06 LAB — CBC
HCT: 37.1 % — ABNORMAL LOW (ref 39.0–52.0)
HEMOGLOBIN: 11.6 g/dL — AB (ref 13.0–17.0)
MCH: 24.8 pg — AB (ref 26.0–34.0)
MCHC: 31.3 g/dL (ref 30.0–36.0)
MCV: 79.3 fL (ref 78.0–100.0)
PLATELETS: 99 10*3/uL — AB (ref 150–400)
RBC: 4.68 MIL/uL (ref 4.22–5.81)
RDW: 20.5 % — ABNORMAL HIGH (ref 11.5–15.5)
WBC: 4 10*3/uL (ref 4.0–10.5)

## 2015-12-06 LAB — FECAL LACTOFERRIN, QUANT: Fecal Lactoferrin: NEGATIVE

## 2015-12-06 LAB — GLUCOSE, CAPILLARY
GLUCOSE-CAPILLARY: 87 mg/dL (ref 65–99)
GLUCOSE-CAPILLARY: 144 mg/dL — AB (ref 65–99)
Glucose-Capillary: 208 mg/dL — ABNORMAL HIGH (ref 65–99)
Glucose-Capillary: 131 mg/dL — ABNORMAL HIGH (ref 65–99)

## 2015-12-06 LAB — RENAL FUNCTION PANEL
ALBUMIN: 3.5 g/dL (ref 3.5–5.0)
Anion gap: 10 (ref 5–15)
BUN: 30 mg/dL — ABNORMAL HIGH (ref 6–20)
CALCIUM: 9.2 mg/dL (ref 8.9–10.3)
CHLORIDE: 97 mmol/L — AB (ref 101–111)
CO2: 29 mmol/L (ref 22–32)
CREATININE: 2.36 mg/dL — AB (ref 0.61–1.24)
GFR calc Af Amer: 29 mL/min — ABNORMAL LOW (ref >60)
GFR, EST NON AFRICAN AMERICAN: 25 mL/min — AB (ref >60)
GLUCOSE: 107 mg/dL — AB (ref 65–99)
Phosphorus: 3.1 mg/dL (ref 2.5–4.6)
Potassium: 3.3 mmol/L — ABNORMAL LOW (ref 3.5–5.1)
SODIUM: 136 mmol/L (ref 135–145)

## 2015-12-06 LAB — GI PATHOGEN PANEL BY PCR, STOOL
CAMPYLOBACTER BY PCR: NOT DETECTED
Cryptosporidium by PCR: NOT DETECTED
E COLI 0157 BY PCR: NOT DETECTED
E coli (ETEC) LT/ST: NOT DETECTED
E coli (STEC): NOT DETECTED
G LAMBLIA BY PCR: NOT DETECTED
Norovirus GI/GII: NOT DETECTED
ROTAVIRUS A BY PCR: NOT DETECTED
SALMONELLA BY PCR: NOT DETECTED
SHIGELLA BY PCR: NOT DETECTED

## 2015-12-06 MED ORDER — TORSEMIDE 20 MG PO TABS: 20.0000 mg | ORAL_TABLET | Freq: Every day | ORAL | Status: DC

## 2015-12-06 MED ORDER — BARIUM SULFATE 2.1 % PO SUSP
450.0000 mL | Freq: Two times a day (BID) | ORAL | Status: DC
  Administered 2015-12-06: 450 mL via ORAL

## 2015-12-06 MED ORDER — FUROSEMIDE 40 MG PO TABS
60.0000 mg | ORAL_TABLET | Freq: Every day | ORAL | Status: DC
  Administered 2015-12-06: 60 mg via ORAL
  Filled 2015-12-06: qty 1

## 2015-12-06 MED ORDER — VANCOMYCIN 50 MG/ML ORAL SOLUTION
125.0000 mg | Freq: Four times a day (QID) | ORAL | Status: DC
  Administered 2015-12-06: 125 mg via ORAL
  Filled 2015-12-06 (×2): qty 2.5

## 2015-12-06 NOTE — Progress Notes (Signed)
Pt states he has been ambulating to bathroom and BSC this admission and requests that bed alarm not be turned on tonight. Pt A/Ox4 and steady on feet.

## 2015-12-06 NOTE — Progress Notes (Signed)
Patient ID: [name] Fiddler, MD, male   DOB: 11-04-1941, 74 y.o.   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 9.  Is getting diuresed - I/O net - 7.2  liters so far this admission  Creatinine is stable    Objective:   Temp:  [97.6 F (36.4 C)-98.4 F (36.9 C)] 98.2 F (36.8 C) (12/13 0633) Pulse Rate:  [60-75] 70 (12/13 0633) Resp:  [16-18] 18 (12/13 0633) BP: (119-157)/(67-83) 150/71 mmHg (12/13 0633) SpO2:  [95 %-97 %] 95 % (12/13 [id number]) Weight:  [212 lb (96.163 kg)] 212 lb (96.163 kg) (12/13 0633) Last BM Date: 12/05/15  Filed Weights   12/04/15 0405 12/05/15 0455 12/06/15 [id number]  Weight: 238 lb 11.2 oz (108.274 kg) 232 lb 6.4 oz (105.416 kg) 212 lb (96.163 kg)    Intake/Output Summary (Last 24 hours) at 12/06/15 1055 Last data filed at 12/06/15 0929  Gross per 24 hour  Intake    940 ml  Output   1800 ml  Net   -860 ml    Telemetry: Afib  Exam:  General: NAD  Resp: mild crackles bilateral bases  Cardiac: irreg, no m/r/g  [id number] soft, NT  MSK: 1+ bilateral LE edema  Neuro: no focal deficits  Psych: appropriate affect  Lab Results:  Basic Metabolic Panel:  Recent Labs Lab 12/04/15 0350 12/05/15 0423 12/06/15 0343  NA 138 137 136  K 4.0 3.4* 3.3*  CL 103 100* 97*  CO2 25 27 29   GLUCOSE 108* 87 107*  BUN 38* 32* 30*  CREATININE 2.52* 2.34* 2.36*  CALCIUM 9.3 9.2 9.2    Liver Function Tests:  Recent Labs Lab 12/02/15 1545 12/04/15 0350 12/05/15 0423 12/06/15 0343  AST 22  --   --   --   ALT 15*  --   --   --   ALKPHOS 66  --   --   --   BILITOT 1.5*  --   --   --   PROT 6.5  --   --   --   ALBUMIN 3.6 3.6 3.4* 3.5    CBC:  Recent Labs Lab 12/02/15 1545 12/05/15 1042 12/06/15 0343    WBC 5.2 5.4 4.0  HGB 12.6* 13.1 11.6*  HCT 40.0 41.4 37.1*  MCV 79.1 79.5 79.3  PLT 85* 87* 99*    Cardiac Enzymes:  Recent Labs Lab 12/02/15 1545 12/02/15 2353 12/03/15 0220  TROPONINI 0.06* 0.07* 0.07*    BNP: No results for input(s): PROBNP in the last 8760 hours.  Coagulation:  Recent Labs Lab 12/02/15 1545  INR 1.50*    ECG:   Medications:   Scheduled Medications: . allopurinol  200 mg Oral Q breakfast  . furosemide  60 mg Intravenous Daily  . insulin aspart  0-20 Units Subcutaneous TID WC  . insulin glargine  2 Units Subcutaneous QPC breakfast  . magnesium oxide  400 mg Oral Daily  . metoprolol  75 mg Oral BID  . mupirocin cream   Topical BID  . sodium chloride  3 mL Intravenous Q12H  . terazosin  10 mg Oral Q breakfast  . zolpidem  10 mg Oral QHS    Infusions:    PRN Medications: sodium chloride, acetaminophen, acetaminophen, diazepam,  diphenoxylate-atropine, ondansetron (ZOFRAN) IV, sodium chloride     Assessment/Plan    1. Acute on chronic combned systolic and diastolic  HF [id number] echo LVEF 50-55%, normal bioprosthetic AVR. Diastolic function not described. Very high E/e' consistent with elevated LA pressures. Echo Dec . 12  shows EF ~ 35- 40%,  Mild - moderate RV enlargement with mild - moderate pulmonary artery HTH I think we can start PO diuretics.  Dr. Ambrose Pancoast has recommended Aldactone .  I think that we could also add torsemide but will leave the final decision up to Dr. Ambrose Pancoast    2. Coronary artery disease: S/p CABG in 2006. Continue beta blocker. He is not on ASA as he is on anticoag No angian    3. History of bioprosthetic AVR   4. Sick sinus syndrome S/P placement of cardiac pacemaker: FU with EP as planned.   5. Persistent AFib/Flutter: Continue rate control with beta blocker, anticoag with eliquis. Patient related onset of diarrhea with starting xarelto about 2 years ago, we have tried changing to eliquis.    6. Chronic kidney disease (CKD), stage III (moderate): Follow renal function closely with diuresis.   7. Ascites: - will not pursue paracentesis per per GI recs, manage with diuretics at this time. Primary concern is worsening renal function with rapid fluid shifts.    8. Pulmnary HTN - mild mod.  Estimated PA pressure of 39 by recent echo.  DC to home in the next day or so .  Has a flex sig tomorrow .    Nahser, [name], MD  12/06/2015 10:55 AM    [place],  [place], [place]  [address] Pager [phone] Phone: [phone]; Fax: [phone]   [facility]  [address] [place], [place]  [address] [phone]   Fax [phone]

## 2015-12-06 NOTE — Progress Notes (Addendum)
Paged PA Angelena Form regarding pt GI pathogen panel coming back positive for cdiff. Paged on call [facility] MD. Will continue to monitor pt.      [facility] MD called back and believes GI pathogen panel for pos cdiff is false, wishes to continue with original plan for flex sig tomorrow 12/14. Pt made aware. Will continue to monitor.  Maurene Capes RN

## 2015-12-06 NOTE — Progress Notes (Signed)
Pt requesting that no one come in the room until lab comes in the AM, pt educated on policy of rounding every 2 hours and pt states he will call if he needs anything.

## 2015-12-06 NOTE — Progress Notes (Signed)
Patient ID: [name] Fiddler, MD, male   DOB: 12-05-1941, 74 y.o.   MRN: [id number] S:feels better and wants to try torsemide instead of furosemide orally O:BP 150/71 mmHg  Pulse 70  Temp(Src) 98.2 F (36.8 C) (Oral)  Resp 18  Ht 5\' 11"  (1.803 m)  Wt 96.163 kg (212 lb)  BMI 29.58 kg/m2  SpO2 95%  Intake/Output Summary (Last 24 hours) at 12/06/15 0808 Last data filed at 12/06/15 [id number]  Gross per 24 hour  Intake    480 ml  Output   2051 ml  Net  -1571 ml   Intake/Output: I/O last 3 completed shifts: In: 680 [P.O.:680] Out: 3776 [Urine:3775; Stool:1]  Intake/Output this shift:    Weight change: -9.253 kg (-20 lb 6.4 oz) Gen:WD obese WM in NAD CVS:IRR, no rub Resp:cta Abd:+BS, soft, +fluid wave [id number] edema   Recent Labs Lab 12/02/15 1545 12/03/15 0220 12/04/15 0350 12/05/15 0423 12/06/15 0343  NA 138 139 138 137 136  K 4.6 4.1 4.0 3.4* 3.3*  CL 106 106 103 100* 97*  CO2 25 24 25 27 29   GLUCOSE 119* 84 108* 87 107*  BUN 46* 43* 38* 32* 30*  CREATININE 2.66* 2.51* 2.52* 2.34* 2.36*  ALBUMIN 3.6  --  3.6 3.4* 3.5  CALCIUM 9.1 8.9 9.3 9.2 9.2  PHOS  --   --  3.0 3.1 3.1  AST 22  --   --   --   --   ALT 15*  --   --   --   --    Liver Function Tests:  Recent Labs Lab 12/02/15 1545 12/04/15 0350 12/05/15 0423 12/06/15 0343  AST 22  --   --   --   ALT 15*  --   --   --   ALKPHOS 66  --   --   --   BILITOT 1.5*  --   --   --   PROT 6.5  --   --   --   ALBUMIN 3.6 3.6 3.4* 3.5   No results for input(s): LIPASE, AMYLASE in the last 168 hours. No results for input(s): AMMONIA in the last 168 hours. CBC:  Recent Labs Lab 12/02/15 1545 12/05/15 1042 12/06/15 0343  WBC 5.2 5.4 4.0  NEUTROABS 4.2  --   --   HGB 12.6* 13.1 11.6*  HCT 40.0 41.4 37.1*  MCV 79.1 79.5 79.3  PLT 85* 87* 99*   Cardiac Enzymes:  Recent Labs Lab 12/02/15 1545 12/02/15 2353 12/03/15 0220  TROPONINI 0.06* 0.07* 0.07*   CBG:  Recent Labs Lab 12/05/15 0658  12/05/15 1201 12/05/15 1637 12/05/15 2112 12/06/15 0608  GLUCAP 87 139* 98 149* 87    Iron Studies: No results for input(s): IRON, TIBC, TRANSFERRIN, FERRITIN in the last 72 hours. Studies/Results: No results found. [name] allopurinol  200 mg Oral Q breakfast  . furosemide  60 mg Intravenous Daily  . insulin aspart  0-20 Units Subcutaneous TID WC  . insulin glargine  2 Units Subcutaneous QPC breakfast  . magnesium oxide  400 mg Oral Daily  . metoprolol  75 mg Oral BID  . mupirocin cream   Topical BID  . sodium chloride  3 mL Intravenous Q12H  . terazosin  10 mg Oral Q breakfast  . zolpidem  10 mg Oral QHS    BMET    Component Value Date/Time   NA 136 12/06/2015 0343   K 3.3* 12/06/2015 0343   CL 97* 12/06/2015 [id number]  CO2 29 12/06/2015 0343   GLUCOSE 107* 12/06/2015 0343   BUN 30* 12/06/2015 0343   CREATININE 2.36* 12/06/2015 0343   CALCIUM 9.2 12/06/2015 0343   CALCIUM 8.1* 06/16/2011 0300   GFRNONAA 25* 12/06/2015 0343   GFRAA 29* 12/06/2015 0343   CBC    Component Value Date/Time   WBC 4.0 12/06/2015 0343   WBC 5.6 12/02/2007 0935   RBC 4.68 12/06/2015 0343   RBC 3.67* 12/02/2007 0935   HGB 11.6* 12/06/2015 0343   HGB 11.2* 12/02/2007 0935   HCT 37.1* 12/06/2015 0343   HCT 31.9* 12/02/2007 0935   PLT 99* 12/06/2015 0343   PLT 166 12/02/2007 0935   MCV 79.3 12/06/2015 0343   MCV 87.0 12/02/2007 0935   MCH 24.8* 12/06/2015 0343   MCH 30.6 12/02/2007 0935   MCHC 31.3 12/06/2015 0343   MCHC 35.2 12/02/2007 0935   RDW 20.5* 12/06/2015 0343   RDW 16.6* 12/02/2007 0935   LYMPHSABS 0.5* 12/02/2015 1545   LYMPHSABS 0.4* 12/02/2007 0935   MONOABS 0.5 12/02/2015 1545   MONOABS 0.5 12/02/2007 0935   EOSABS 0.1 12/02/2015 1545   EOSABS 0.0 12/02/2007 0935   BASOSABS 0.0 12/02/2015 1545   BASOSABS 0.0 12/02/2007 0935     Assessment/Plan:  1. AKI/CKD due to decompensated CHF. Now back at baseline with marked diuresis. Will plan on switching over to po lasix  tomorrow. 2. Acute on chronic CHF- ECHO with decreased EF so systolic and diastolic.  1. Diuresing well and per patient Cards recommended torsemide instead of po lasix. 2. Will change po lasix to torsemide tomorrow morning and follow per patient's request. 3. Diarrhea- w/u per GI, C. Diff negative.  For flex sig tomorrow. 4. Ascites- likely due to CHF but cirrhosis also on differential.  Appreciate GI input and plan for non-contrasted CT scan to evaluate liver.  Paracentesis on hold per GI but given thrombocytopenia agree with imaging of liver and spleen 5. Thrombocytopenia- has been intermittent over the past 6-8 years and not sure of etiology. 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 Consider Heme evaluation.  6. AVR - stable 7. CAD s/p CABG- stable 8. A. Fib- rate controlled 9. pacemaker - stable  Lennix Kneisel A

## 2015-12-06 NOTE — Progress Notes (Signed)
     Paged by RN. Cdif +. Will start oral vanc. We will let GI know  Angelena Form PA-C  [facility]

## 2015-12-06 NOTE — Progress Notes (Signed)
Patient ID: [name] Fiddler, MD, male   DOB: 30-Apr-1941, 74 y.o.   MRN: [id number] [name]  [name] Pearson II, MD 74 y.o. 05/05/1941   Subjective: Reports a semi-formed stool overnight as well as loose stools. No abdominal pain. Anxiously wants to go home tomorrow if possible.  Objective: Vital signs in last 24 hours: Filed Vitals:   12/05/15 2148 12/06/15 0633  BP:  150/71  Pulse: 68 70  Temp:  98.2 F (36.8 C)  Resp:  18    Physical Exam: Gen: alert, no acute distress, obese HEENT: anicteric CV: RRR Chest: CTA B Abd: soft, nontender, +distention, +BS  Lab Results:  Recent Labs  12/05/15 0423 12/06/15 0343  NA 137 136  K 3.4* 3.3*  CL 100* 97*  CO2 27 29  GLUCOSE 87 107*  BUN 32* 30*  CREATININE 2.34* 2.36*  CALCIUM 9.2 9.2  PHOS 3.1 3.1    Recent Labs  12/05/15 0423 12/06/15 0343  ALBUMIN 3.4* 3.5    Recent Labs  12/05/15 1042 12/06/15 0343  WBC 5.4 4.0  HGB 13.1 11.6*  HCT 41.4 37.1*  MCV 79.5 79.3  PLT 87* 99*   No results for input(s): LABPROT, INR in the last 72 hours.    Assessment/Plan: Multiple medical problems with ascites likely multifactorial from CHF ?cirrhosis. Noncontrast CT Abd today to evaluate liver/spleen. Flex sig tomorrow morning at 8AM to evaluate the chronic diarrhea. Eliquis on hold for planned flex sig for biopsies. Would recommend holding Eliquis until Thursday (assuming biopsies are done during the flex sig). Consider discharge late Wed or Thursday morning pending flex sig results if ok with cardiology.   Kirwin C. 12/06/2015, 10:06 AM  Pager [phone]  If no answer or after 5 PM call [phone]

## 2015-12-07 ENCOUNTER — Encounter (HOSPITAL_COMMUNITY)
Admission: AD | Disposition: A | Discharge status: Home / Self Care | Payer: Self-pay | Source: Ambulatory Visit | Attending: Internal Medicine

## 2015-12-07 ENCOUNTER — Encounter (HOSPITAL_COMMUNITY): Payer: Self-pay | Admitting: *Deleted

## 2015-12-07 DIAGNOSIS — R197 Diarrhea, unspecified: Secondary | ICD-10-CM

## 2015-12-07 DIAGNOSIS — N183 Chronic kidney disease, stage 3 (moderate): Secondary | ICD-10-CM

## 2015-12-07 HISTORY — PX: FLEXIBLE SIGMOIDOSCOPY: SHX5431

## 2015-12-07 LAB — RENAL FUNCTION PANEL
ALBUMIN: 3.6 g/dL (ref 3.5–5.0)
ANION GAP: 9 (ref 5–15)
BUN: 28 mg/dL — ABNORMAL HIGH (ref 6–20)
CHLORIDE: 98 mmol/L — AB (ref 101–111)
CO2: 29 mmol/L (ref 22–32)
Calcium: 9.3 mg/dL (ref 8.9–10.3)
Creatinine, Ser: 2.37 mg/dL — ABNORMAL HIGH (ref 0.61–1.24)
GFR, EST AFRICAN AMERICAN: 29 mL/min — AB (ref >60)
GFR, EST NON AFRICAN AMERICAN: 25 mL/min — AB (ref >60)
Glucose, Bld: 103 mg/dL — ABNORMAL HIGH (ref 65–99)
PHOSPHORUS: 3.1 mg/dL (ref 2.5–4.6)
POTASSIUM: 3.6 mmol/L (ref 3.5–5.1)
Sodium: 136 mmol/L (ref 135–145)

## 2015-12-07 LAB — GLUCOSE, CAPILLARY
GLUCOSE-CAPILLARY: 88 mg/dL (ref 65–99)
GLUCOSE-CAPILLARY: 103 mg/dL — AB (ref 65–99)

## 2015-12-07 SURGERY — SIGMOIDOSCOPY, FLEXIBLE
Anesthesia: Moderate Sedation

## 2015-12-07 MED ORDER — MIDAZOLAM HCL 5 MG/ML IJ SOLN
INTRAMUSCULAR | Status: AC
  Filled 2015-12-07: qty 2

## 2015-12-07 MED ORDER — METOPROLOL TARTRATE 50 MG PO TABS: 50.0000 mg | ORAL_TABLET | Freq: Two times a day (BID) | ORAL | Status: DC

## 2015-12-07 MED ORDER — MIDAZOLAM HCL 10 MG/2ML IJ SOLN
INTRAMUSCULAR | Status: DC | PRN
  Administered 2015-12-07 (×5): 2 mg via INTRAVENOUS

## 2015-12-07 MED ORDER — METOPROLOL TARTRATE 25 MG PO TABS: 25.0000 mg | ORAL_TABLET | Freq: Two times a day (BID) | ORAL | Status: DC

## 2015-12-07 MED ORDER — FENTANYL CITRATE (PF) 100 MCG/2ML IJ SOLN
INTRAMUSCULAR | Status: AC
  Filled 2015-12-07: qty 2

## 2015-12-07 MED ORDER — SODIUM CHLORIDE 0.9 % IV SOLN
INTRAVENOUS | Status: DC
Start: 2015-12-07 — End: 2015-12-07
  Administered 2015-12-07: 500 mL via INTRAVENOUS

## 2015-12-07 MED ORDER — DIPHENHYDRAMINE HCL 50 MG/ML IJ SOLN
INTRAMUSCULAR | Status: AC
  Filled 2015-12-07: qty 1

## 2015-12-07 MED ORDER — TORSEMIDE 20 MG PO TABS
40.0000 mg | ORAL_TABLET | Freq: Every day | ORAL | Status: DC
  Administered 2015-12-07: 40 mg via ORAL
  Filled 2015-12-07: qty 2

## 2015-12-07 MED ORDER — TORSEMIDE 20 MG PO TABS: 40.0000 mg | ORAL_TABLET | Freq: Every day | ORAL | Status: DC

## 2015-12-07 MED ORDER — FENTANYL CITRATE (PF) 100 MCG/2ML IJ SOLN
INTRAMUSCULAR | Status: DC | PRN
  Administered 2015-12-07 (×4): 25 ug via INTRAVENOUS

## 2015-12-07 NOTE — Progress Notes (Signed)
Patient ID: [name], MD, male   DOB: 1941-09-02, 74 y.o.   MRN: [id number] S:Feels better but still having diarrhea O:BP 144/79 mmHg  Pulse 67  Temp(Src) 98.2 F (36.8 C) (Oral)  Resp 18  Ht 5\' 11"  (1.803 m)  Wt 104.282 kg (229 lb 14.4 oz)  BMI 32.08 kg/m2  SpO2 94%  Intake/Output Summary (Last 24 hours) at 12/07/15 0815 Last data filed at 12/07/15 0600  Gross per 24 hour  Intake   1723 ml  Output    654 ml  Net   1069 ml   Intake/Output: I/O last 3 completed shifts: In: 1963 [P.O.:1960; I.V.:3] Out: 1779 [Urine:1777; Stool:2]  Intake/Output this shift:    Weight change: 8.119 kg (17 lb 14.4 oz) Gen:WD obese WM in NAd CVS:IRR IRR, II/VI SEM Resp:cta [id number], +BS, soft, +fluid wave [id number] pretibial edema   Recent Labs Lab 12/02/15 1545 12/03/15 0220 12/04/15 0350 12/05/15 0423 12/06/15 0343 12/07/15 0333  NA 138 139 138 137 136 136  K 4.6 4.1 4.0 3.4* 3.3* 3.6  CL 106 106 103 100* 97* 98*  CO2 25 24 25 27 29 29   GLUCOSE 119* 84 108* 87 107* 103*  BUN 46* 43* 38* 32* 30* 28*  CREATININE 2.66* 2.51* 2.52* 2.34* 2.36* 2.37*  ALBUMIN 3.6  --  3.6 3.4* 3.5 3.6  CALCIUM 9.1 8.9 9.3 9.2 9.2 9.3  PHOS  --   --  3.0 3.1 3.1 3.1  AST 22  --   --   --   --   --   ALT 15*  --   --   --   --   --    Liver Function Tests:  Recent Labs Lab 12/02/15 1545  12/05/15 0423 12/06/15 0343 12/07/15 0333  AST 22  --   --   --   --   ALT 15*  --   --   --   --   ALKPHOS 66  --   --   --   --   BILITOT 1.5*  --   --   --   --   PROT 6.5  --   --   --   --   ALBUMIN 3.6  < > 3.4* 3.5 3.6  < > = values in this interval not displayed. No results for input(s): LIPASE, AMYLASE in the last 168 hours. No results for input(s): AMMONIA in the last 168 hours. CBC:  Recent Labs Lab 12/02/15 1545 12/05/15 1042 12/06/15 0343  WBC 5.2 5.4 4.0  NEUTROABS 4.2  --   --   HGB 12.6* 13.1 11.6*  HCT 40.0 41.4 37.1*  MCV 79.1 79.5 79.3  PLT 85* 87* 99*    Cardiac Enzymes:  Recent Labs Lab 12/02/15 1545 12/02/15 2353 12/03/15 0220  TROPONINI 0.06* 0.07* 0.07*   CBG:  Recent Labs Lab 12/06/15 0608 12/06/15 1132 12/06/15 1629 12/06/15 2141 12/07/15 0634  GLUCAP 87 208* 144* 131* 88    Iron Studies: No results for input(s): IRON, TIBC, TRANSFERRIN, FERRITIN in the last 72 hours. Studies/Results: Ct Abdomen Pelvis Wo Contrast  12/06/2015  CLINICAL DATA:  Abdominal pain. Progressive diarrhea over the past 18 months. Ascites. EXAM: CT ABDOMEN AND PELVIS WITHOUT CONTRAST TECHNIQUE: Multidetector CT imaging of the abdomen and pelvis was performed following the standard protocol without IV contrast. COMPARISON:  Plain films 05/29/2014.  Most recent CT of 01/19/2011 FINDINGS: Lower chest: Clear lung bases. Moderate cardiomegaly with incompletely imaged pacer. Median sternotomy  with Coronary artery atherosclerosis. Mediastinal and bilateral hilar calcified nodes are consistent with old granulomatous disease. Small right and trace left pleural effusions. Hepatobiliary: Mild, heterogeneous hepatic steatosis. Multiple gallstones without acute cholecystitis or biliary duct dilatation. Pancreas: Normal, without mass or ductal dilatation. Spleen: Normal in size, without focal abnormality. Adrenals/Urinary Tract: Normal adrenal glands. Bilateral fluid density renal lesions which are most consistent with cysts. No renal calculi or hydronephrosis. No hydroureter or ureteric calculi. Equivocal mild bladder wall thickening. Stomach/Bowel: Normal stomach, without wall thickening. Normal colon, appendix, and terminal ileum. Normal small bowel. Vascular/Lymphatic: Advanced aortic and branch vessel atherosclerosis. No abdominopelvic adenopathy. Reproductive: Moderate prostatomegaly. Other: Small volume abdominal pelvic ascites. Musculoskeletal: No acute osseous abnormality. Disc bulges at multiple levels, including L4-5 and L5-S1. IMPRESSION: 1.  No acute process  in the abdomen or pelvis. 2. Small bilateral pleural effusions with small volume abdominal pelvic ascites. Question fluid overload. 3. Cholelithiasis. 4. Prostatomegaly with equivocal bladder wall thickening, possibly related to a component of bladder outlet obstruction. 5. Advanced atherosclerosis, status post median sternotomy. 6. Heterogeneous hepatic steatosis. Electronically Signed   By: [name] M.D.   On: 12/06/2015 17:34   . allopurinol  200 mg Oral Q breakfast  . Barium Sulfate  450 mL Oral BID  . insulin aspart  0-20 Units Subcutaneous TID WC  . insulin glargine  2 Units Subcutaneous QPC breakfast  . magnesium oxide  400 mg Oral Daily  . metoprolol  75 mg Oral BID  . mupirocin cream   Topical BID  . sodium chloride  3 mL Intravenous Q12H  . terazosin  10 mg Oral Q breakfast  . torsemide  20 mg Oral Daily  . zolpidem  10 mg Oral QHS    BMET    Component Value Date/Time   NA 136 12/07/2015 0333   K 3.6 12/07/2015 0333   CL 98* 12/07/2015 0333   CO2 29 12/07/2015 0333   GLUCOSE 103* 12/07/2015 0333   BUN 28* 12/07/2015 0333   CREATININE 2.37* 12/07/2015 0333   CALCIUM 9.3 12/07/2015 0333   CALCIUM 8.1* 06/16/2011 0300   GFRNONAA 25* 12/07/2015 0333   GFRAA 29* 12/07/2015 0333   CBC    Component Value Date/Time   WBC 4.0 12/06/2015 0343   WBC 5.6 12/02/2007 0935   RBC 4.68 12/06/2015 0343   RBC 3.67* 12/02/2007 0935   HGB 11.6* 12/06/2015 0343   HGB 11.2* 12/02/2007 0935   HCT 37.1* 12/06/2015 0343   HCT 31.9* 12/02/2007 0935   PLT 99* 12/06/2015 0343   PLT 166 12/02/2007 0935   MCV 79.3 12/06/2015 0343   MCV 87.0 12/02/2007 0935   MCH 24.8* 12/06/2015 0343   MCH 30.6 12/02/2007 0935   MCHC 31.3 12/06/2015 0343   MCHC 35.2 12/02/2007 0935   RDW 20.5* 12/06/2015 0343   RDW 16.6* 12/02/2007 0935   LYMPHSABS 0.5* 12/02/2015 1545   LYMPHSABS 0.4* 12/02/2007 0935   MONOABS 0.5 12/02/2015 1545   MONOABS 0.5 12/02/2007 0935   EOSABS 0.1 12/02/2015 1545    EOSABS 0.0 12/02/2007 0935   BASOSABS 0.0 12/02/2015 1545   BASOSABS 0.0 12/02/2007 0935     Assessment/Plan:  1. AKI/CKD due to decompensated CHF. Now back at baseline with marked diuresis. Will plan on switching over to po lasix tomorrow. 2. Acute on chronic CHF- ECHO with decreased EF so systolic and diastolic.  1. Diuresed well with IV lasix but not as well with po.  2. Will change po lasix to  torsemide this morning and follow 3. Diarrhea- w/u per GI, C. Diff initially negative but now positive.  1. Po vanco per GI/ID vs no abx for colonization (may be better to try him on po vanc to see if this impacts upon his diarrhea) 2. flex sig today without evidence of pseudomembranous colitis. 4. Ascites- likely due to CHF but cirrhosis also on differential.  1. Appreciate GI input  2. no evidence of cirrhosis by non-contrasted CT scan.  3. Paracentesis on hold per GI 5. Thrombocytopenia- has been intermittent over the past 6-8 years and not sure of etiology. 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 Consider Heme evaluation. no splenomegaly by CT. 6. AVR - stable 7. CAD s/p CABG- stable 8. A. Fib- rate controlled 9. pacemaker - stable 10. Disposition- pending discharge possible for today, however waiting to hear from ID regarding po vanco or no abx.  Diarrhea still and issue so unclear what is best.  We will contact [name] for follow up with our office next week for BP check, weight and labs and adjust diuretics as needed.  Will send script for torsemide 40mg  to his outpt pharmacy.  Fraser A

## 2015-12-07 NOTE — H&P (View-Only) (Signed)
Patient ID: [name], MD, male   DOB: 08/16/1941, 74 y.o.   MRN: [id number] [facility] Progress Note  Marylynn Pearson II, MD 74 y.o. [birth year]   Subjective: Reports a semi-formed stool overnight as well as loose stools. No abdominal pain. Anxiously wants to go home tomorrow if possible.  Objective: Vital signs in last 24 hours: Filed Vitals:   12/05/15 2148 12/06/15 0633  BP:  150/71  Pulse: 68 70  Temp:  98.2 F (36.8 C)  Resp:  18    Physical Exam: Gen: alert, no acute distress, obese HEENT: anicteric CV: RRR Chest: CTA B Abd: soft, nontender, +distention, +BS  Lab Results:  Recent Labs  12/05/15 0423 12/06/15 0343  NA 137 136  K 3.4* 3.3*  CL 100* 97*  CO2 27 29  GLUCOSE 87 107*  BUN 32* 30*  CREATININE 2.34* 2.36*  CALCIUM 9.2 9.2  PHOS 3.1 3.1    Recent Labs  12/05/15 0423 12/06/15 0343  ALBUMIN 3.4* 3.5    Recent Labs  12/05/15 1042 12/06/15 0343  WBC 5.4 4.0  HGB 13.1 11.6*  HCT 41.4 37.1*  MCV 79.5 79.3  PLT 87* 99*   No results for input(s): LABPROT, INR in the last 72 hours.    Assessment/Plan: Multiple medical problems with ascites likely multifactorial from CHF ?cirrhosis. Noncontrast CT Abd today to evaluate liver/spleen. Flex sig tomorrow morning at 8AM to evaluate the chronic diarrhea. Eliquis on hold for planned flex sig for biopsies. Would recommend holding Eliquis until Thursday (assuming biopsies are done during the flex sig). Consider discharge late Wed or Thursday morning pending flex sig results if ok with cardiology.   [place] C. 12/06/2015, 10:06 AM  Pager [phone]  If no answer or after 5 PM call [phone]

## 2015-12-07 NOTE — Brief Op Note (Addendum)
Solid and semi-solid stool seen on flexible sigmoidoscopy without any visualized colitis. No pseudomembranes seen. Biopsies taken. Discussed positive C. Diff toxin with negative C. Diff antigen with Dr. 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 Diff. I have asked Dr. Johnnye Sima to formally see patient as well. Hold Eliquis for another 24 hours and then resume. Patient wants to change back to Xarelto because he says he gets headaches on Eliquis. Will defer timing and type of anticoagulation to cardiology. No cirrhosis seen on CT scan. Start heart healthy diet. Use Imodium or Lomotil prn diarrhea but would not use on a scheduled basis. Avoid antispasmodics. Ok to go home later today from a GI standpoint. F/U on colonic biopsies as an outpt. Will sign off.

## 2015-12-07 NOTE — Progress Notes (Addendum)
Patient ID: [name] Fiddler, MD, male   DOB: [birth year], 74 y.o.   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 9.  Is getting diuresed - I/O net - 6.6  liters so far this admission  Creatinine is stable   Flex sig was negative for pseudomembrane   Objective:   Temp:  [97.5 F (36.4 C)-98.3 F (36.8 C)] 98.1 F (36.7 C) (12/14 0845) Pulse Rate:  [67-72] 69 (12/14 1134) Resp:  [10-23] 15 (12/14 1005) BP: (131-177)/(57-86) 152/86 mmHg (12/14 1134) SpO2:  [94 %-99 %] 94 % (12/14 1005) Weight:  [229 lb 14.4 oz (104.282 kg)] 229 lb 14.4 oz (104.282 kg) (12/14 0338) Last BM Date: 12/07/15  Filed Weights   12/05/15 0455 12/06/15 [id number] 12/07/15 0338  Weight: 232 lb 6.4 oz (105.416 kg) 212 lb (96.163 kg) 229 lb 14.4 oz (104.282 kg)    Intake/Output Summary (Last 24 hours) at 12/07/15 1240 Last data filed at 12/07/15 1004  Gross per 24 hour  Intake   1223 ml  Output    654 ml  Net    569 ml    Telemetry: Afib  Exam:  General: NAD  Resp: mild crackles bilateral bases  Cardiac: irreg, no m/r/g  [id number] soft, NT  MSK: no edema  Neuro: no focal deficits  Psych: appropriate affect  Lab Results:  Basic Metabolic Panel:  Recent Labs Lab 12/05/15 0423 12/06/15 0343 12/07/15 0333  NA 137 136 136  K 3.4* 3.3* 3.6  CL 100* 97* 98*  CO2 27 29 29   GLUCOSE 87 107* 103*  BUN 32* 30* 28*  CREATININE 2.34* 2.36* 2.37*  CALCIUM 9.2 9.2 9.3    Liver Function Tests:  Recent Labs Lab 12/02/15 1545  12/05/15 0423 12/06/15 0343 12/07/15 0333  AST 22  --   --   --   --   ALT 15*  --   --   --   --   ALKPHOS 66  --   --   --   --   BILITOT 1.5*  --   --   --   --   PROT 6.5  --   --   --   --   ALBUMIN 3.6  < > 3.4* 3.5 3.6    < > = values in this interval not displayed.  CBC:  Recent Labs Lab 12/02/15 1545 12/05/15 1042 12/06/15 0343  WBC 5.2 5.4 4.0  HGB 12.6* 13.1 11.6*  HCT 40.0 41.4 37.1*  MCV 79.1 79.5 79.3  PLT 85* 87* 99*    Cardiac Enzymes:  Recent Labs Lab 12/02/15 1545 12/02/15 2353 12/03/15 0220  TROPONINI 0.06* 0.07* 0.07*    BNP: No results for input(s): PROBNP in the last 8760 hours.  Coagulation:  Recent Labs Lab 12/02/15 1545  INR 1.50*    ECG:   Medications:   Scheduled Medications: . allopurinol  200 mg Oral Q breakfast  . Barium Sulfate  450 mL Oral BID  . insulin aspart  0-20 Units Subcutaneous TID WC  . insulin glargine  2 Units Subcutaneous QPC breakfast  . magnesium oxide  400 mg Oral Daily  . metoprolol  75 mg Oral BID  . mupirocin cream   Topical BID  .  sodium chloride  3 mL Intravenous Q12H  . terazosin  10 mg Oral Q breakfast  . torsemide  40 mg Oral Daily  . zolpidem  10 mg Oral QHS    Infusions:    PRN Medications: sodium chloride, acetaminophen, acetaminophen, diazepam, diphenoxylate-atropine, ondansetron (ZOFRAN) IV, sodium chloride     Assessment/Plan    1. Acute on chronic combned systolic and diastolic  HF [id number] echo LVEF 50-55%, normal bioprosthetic AVR. Diastolic function not described. Very high E/e' consistent with elevated LA pressures. Echo Dec . 12  shows EF ~ 35- 40%,  Mild - moderate RV enlargement with mild - moderate pulmonary artery HTH We have started torsemide Will anticipate starting aldactone next week   2. Coronary artery disease: S/p CABG in 2006. Continue beta blocker. He is not on ASA as he is on anticoag No angian    3. History of bioprosthetic AVR   4. Sick sinus syndrome S/P placement of cardiac pacemaker: FU with EP as planned.   5. Persistent AFib/Flutter: Continue rate control with beta blocker, anticoag with eliquis.   eliquis seems to be giving him a head ache. Wants to go back on  Eliquis - starting tomorrow    6. Chronic kidney disease (CKD), stage III (moderate): Follow renal function closely with diuresis.   7. Ascites: - will not pursue paracentesis per per GI recs, manage with diuretics at this time. Primary concern is worsening renal function with rapid fluid shifts.    8. Pulmnary HTN - mild mod.  Estimated PA pressure of 39 by recent echo.     Dr. Johnnye Sima to see [name]  today . Will defer to him re: treatment of the C.diff or not.    DC meds Metoprolol 75 mg BID   ( he wants a 50 mg tab and a 25 mg tab - does not want to break them in half Xarelto 15 mg a day  Torsemide 40 a day  Terazosin 10  ambien as needed Mag ox Insulin Allopurinol Lomotil ? Others    Melek Pownall, [name], MD  12/07/2015 12:40 PM    [place] [address],  [place], [place]  [address] Pager [phone] Phone: [phone]; Fax: [phone]   [facility]  [address] [place], [place]  [address] [phone]   Fax [phone]

## 2015-12-07 NOTE — Discharge Summary (Signed)
CARDIOLOGY DISCHARGE SUMMARY   Patient ID: [name], [name] MRN: [id number] DOB/AGE: 1941/03/30 74 y.o.  Admit date: 12/02/2015 Discharge date: 12/07/2015  PCP: [name], [name] Primary Cardiologist: Dr. Mare Ferrari  Primary Discharge Diagnosis:  Acute on chronic diastolic CHF (congestive heart failure) [facility]) Secondary Discharge Diagnosis:    Ischemic heart disease   Valvular heart disease   Diabetes ([place])   Essential hypertension   Chronic kidney disease (CKD), stage III (moderate)   Chronic atrial fibrillation (HCC)   Atrial flutter (HCC)   Insomnia   Cardiac device in situ   Ascites   Diarrhea   Leg cramps   Acute on chronic diastolic CHF (congestive heart failure), NYHA class 1 ([place])   Consults: Nephrology, Gastroenterology, Infectious Disease  Procedures: Transthoracic Echocardiogram, Flexible Sigmoidoscopy  Hospital Course: [name], [name] is a 74 y.o. 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 Weight was noted to be up at least 12 pounds. His BNP was elevated to 1386.  Upon admission, he was started on Lasix 44m IV daily and his PTA Spironolactone was held. Nephrology was consulted to help with his diuretic dosing in the setting of his Stage 4 CKD (Creatinine was 2.66 at time of admission). Due to his significant abdominal distension, GI was consulted for possible paracentesis.  GI recommended managing his ascites with diuretics instead of performing a paracentesis due to concern for worsening renal function with a rapid volume shift. In talking with GI, it was brought up that he had a history of non-bloody loose stools over the past few years  but worse over the past month. A flex sigmoidoscopy was recommended and Eliquis was held appropriately in preparation for this (He had wanted to try Eliquis in place of Xarelto this admission. He developed headaches with Eliquis and was switched back to Xarelto prior to discharge.) Due to his loose stools, a GI pathogen panel was obtained and was positive for C.diff. His flex sig was performed on 12/07/2015 and showed solid and semi-solid stool without any visualized colitis. Due to his C. Diff toxin being positive and C.diff antigen being negative, GI recommended Infectious Disease evaluate the patient prior to discharge for possible Abx treatment. Infectious Disease evaluated the patient and recommended not treating unless his WBC increased, he developed a fever, or he experienced worsening diarrhea.  In regards to his diuresis, he had a net output of -6.6L this admission and his weight went from 254 lbs to 229lbs at the time of discharge. He diuresed well with IV Lasix but it was thought this might not be the best PO option for him going home. Therefore, he was started on Torsemide 458mdaily. His creatinine was stable at 2.37 on 12/07/2015. He will follow-up with his Nephrologist in the next week and it will be decided at that time whether or not to restart his Aldactone.  The patient was last examined by Dr. NaAcie Fredricksonnd deemed stable for discharge. Offered closer follow-up within the next few weeks, but he declined and wishes to keep his scheduled follow-up with Dr. BrMare Ferrarin 12/2015 and will call back with further questions or concerns if any arise prior to that visit.  Labs:   Lab Results  Component Value Date   WBC 4.0 12/06/2015   HGB 11.6* 12/06/2015   HCT 37.1* 12/06/2015   MCV 79.3 12/06/2015   PLT 99* 12/06/2015    Recent Labs Lab 12/02/15 1545  12/07/15 0333  NA 138  < > 136  K 4.6  < > 3.6  CL 106  < > 98*  CO2 25  < > 29  BUN 46*  < > 28*  CREATININE 2.66*  < > 2.37*    CALCIUM 9.1  < > 9.3  PROT 6.5  --   --   BILITOT 1.5*  --   --   ALKPHOS 66  --   --   ALT 15*  --   --   AST 22  --   --   GLUCOSE 119*  < > 103*  < > = values in this interval not displayed. No results for input(s): CKTOTAL, CKMB, CKMBINDEX, TROPONINI in the last 72 hours. Lipid Panel     Component Value Date/Time   CHOL 106 05/28/2014 0140   TRIG 169* 05/28/2014 0140   HDL 14* 05/28/2014 0140   CHOLHDL 7.6 05/28/2014 0140   VLDL 34 05/28/2014 0140   LDLCALC 58 05/28/2014 0140    B NATRIURETIC PEPTIDE  Date/Time Value Ref Range Status  12/02/2015 03:45 PM 1383.6* 0.0 - 100.0 pg/mL Final  09/06/2015 06:14 PM 590.2* 0.0 - 100.0 pg/mL Final      Radiology:   Ct Abdomen Pelvis Wo Contrast: 12/06/2015  CLINICAL DATA:  Abdominal pain. Progressive diarrhea over the past 18 months. Ascites. EXAM: CT ABDOMEN AND PELVIS WITHOUT CONTRAST TECHNIQUE: Multidetector CT imaging of the abdomen and pelvis was performed following the standard protocol without IV contrast. COMPARISON:  Plain films 05/29/2014.  Most recent CT of 01/19/2011 FINDINGS: Lower chest: Clear lung bases. Moderate cardiomegaly with incompletely imaged pacer. Median sternotomy with Coronary artery atherosclerosis. Mediastinal and bilateral hilar calcified nodes are consistent with old granulomatous disease. Small right and trace left pleural effusions. Hepatobiliary: Mild, heterogeneous hepatic steatosis. Multiple gallstones without acute cholecystitis or biliary duct dilatation. Pancreas: Normal, without mass or ductal dilatation. Spleen: Normal in size, without focal abnormality. Adrenals/Urinary Tract: Normal adrenal glands. Bilateral fluid density renal lesions which are most consistent with cysts. No renal calculi or hydronephrosis. No hydroureter or ureteric calculi. Equivocal mild bladder wall thickening. Stomach/Bowel: Normal stomach, without wall thickening. Normal colon, appendix, and terminal ileum. Normal small bowel.  Vascular/Lymphatic: Advanced aortic and branch vessel atherosclerosis. No abdominopelvic adenopathy. Reproductive: Moderate prostatomegaly. Other: Small volume abdominal pelvic ascites. Musculoskeletal: No acute osseous abnormality. Disc bulges at multiple levels, including L4-5 and L5-S1. IMPRESSION: 1.  No acute process in the abdomen or pelvis. 2. Small bilateral pleural effusions with small volume abdominal pelvic ascites. Question fluid overload. 3. Cholelithiasis. 4. Prostatomegaly with equivocal bladder wall thickening, possibly related to a component of bladder outlet obstruction. 5. Advanced atherosclerosis, status post median sternotomy. 6. Heterogeneous hepatic steatosis. Electronically Signed   By: [name] M.D.   On: 12/06/2015 17:34   Dg Chest 2 View: 12/03/2015  CLINICAL DATA:  Acute on chronic diastolic congestive heart failure EXAM: CHEST  2 VIEW COMPARISON:  Radiograph 09/05/2005 FINDINGS: Left-sided pacemaker with 2 continuous leads overlies normal cardiac silhouette. There is chronic scarring centrally. No effusion, infiltrate, pneumothorax. Degenerative osteophytosis of the thoracic spine. IMPRESSION: Cardiomegaly and chronic bronchitic findings.  No acute findings. Electronically Signed   By: Suzy Bouchard M.D.   On:  12/03/2015 10:40     EKG: AFib/Flutter, HR 102, LAD, IVCD, RBBB, no sig change from prior tracing  Echo: 12/04/2015 Study Conclusions - Left ventricle: The cavity size was normal. Wall thickness was increased in a pattern of mild LVH. Systolic function was moderately reduced. The estimated ejection fraction was in the range of 35% to 40%. - Aortic valve: A bioprosthesis was present. Valve area (VTI): 1.51 cm^2. Valve area (Vmax): 1.35 cm^2. Valve area (Vmean): 1.36 cm^2. - Mitral valve: Moderately calcified annulus. There was mild regurgitation. - Left atrium: The atrium was mildly dilated. - Right ventricle: The cavity size was mildly  dilated. Systolic function was moderately reduced. - Right atrium: The atrium was moderately dilated. - Tricuspid valve: Prior procedures included surgical repair. - Pulmonary arteries: Systolic pressure was mildly increased. PA peak pressure: 39 mm Hg (S).  FOLLOW UP PLANS AND APPOINTMENTS Allergies  Allergen Reactions  . Actos [Pioglitazone Hydrochloride] Swelling and Other (See Comments)    Severe peripheral edema  . Codeine Other (See Comments)    Makes Pt aggressive  . Depakote [Divalproex Sodium] Diarrhea    severe  . Diltiazem Other (See Comments)    lethargy and dyspnea  . Hydralazine Other (See Comments)    Severe chills and SOB   . Isosorbide Dinitrate Other (See Comments)    Severe chills and SOB   . Januvia [Sitagliptin] Diarrhea and Other (See Comments)    bradycardia  . Loop Diuretics Other (See Comments)    Spike in BUN and creatine levels  . Losartan Other (See Comments)    aphasia  . Nsaids Other (See Comments)    Renal problems  . Onglyza [Saxagliptin] Diarrhea and Other (See Comments)    bradycardia  . Orudis [Ketoprofen] Other (See Comments)    Cannot take due to renal insufficiency  . Penicillins Swelling and Other (See Comments)    Serum sickness  . Potassium Iodide Itching and Rash    Severe entire body itching and rash  . Rozerem [Ramelteon] Diarrhea    Severe   . Amiodarone Other (See Comments)    diarrhea  . Latex Swelling  . Verapamil Other (See Comments)    Severe fatigue  . Benazepril Hcl Other (See Comments)    ? Possible lowers plateletes  . Indomethacin Other (See Comments)    Renal problems   . Minocycline Other (See Comments)    Makes dizzy and feel just lousy.  [name] Pentazocine Lactate Other (See Comments)    Unknown allergic reaction - pt and wife do not recall this  . Poison Ivy Extract [Extract Of Poison Ivy] Rash and Other (See Comments)    blisters  . Sertraline Hcl Other (See Comments)    Pt and wife do not recall  this  . Shellfish Allergy Rash     Medication List    STOP taking these medications        furosemide 40 MG tablet  Commonly known as:  LASIX     spironolactone 25 MG tablet  Commonly known as:  ALDACTONE      TAKE these medications        acetaminophen 500 MG tablet  Commonly known as:  TYLENOL  Take 1,000 mg by mouth 2 (two) times daily as needed (pain).     allopurinol 100 MG tablet  Commonly known as:  ZYLOPRIM  Take 200 mg by mouth daily with breakfast.     diazepam 10 MG tablet  Commonly known as:  VALIUM  Take 1 tablet by mouth daily as needed for anxiety or sleep. Ankle and foot cramps     diphenhydrAMINE 25 MG tablet  Commonly known as:  BENADRYL  Take 25 mg by mouth every 6 (six) hours as needed for allergies.     diphenoxylate-atropine 2.5-0.025 MG tablet  Commonly known as:  LOMOTIL  Take 1-2 tablets by mouth daily as needed. stomach     insulin glargine 100 unit/mL Sopn  Commonly known as:  LANTUS  Inject 2-3 Units into the skin daily after breakfast. CBG 120-140 2 units, 140-160 3 units     Magnesium 250 MG Tabs  Take 125 mg by mouth daily.     metoprolol 50 MG tablet  Commonly known as:  LOPRESSOR  Take 1 tablet (50 mg total) by mouth 2 (two) times daily.     metoprolol tartrate 25 MG tablet  Commonly known as:  LOPRESSOR  Take 1 tablet (25 mg total) by mouth 2 (two) times daily.     terazosin 10 MG capsule  Commonly known as:  HYTRIN  Take 10 mg by mouth daily with breakfast.     Testosterone Cypionate 200 MG/ML Kit  Inject 1 mL into the muscle every 14 (fourteen) days.     torsemide 20 MG tablet  Commonly known as:  DEMADEX  Take 2 tablets (40 mg total) by mouth daily.     XARELTO 15 MG Tabs tablet  Generic drug:  Rivaroxaban  Take 15 mg by mouth every evening.     zolpidem 10 MG tablet  Commonly known as:  AMBIEN  Take 10 mg by mouth at bedtime.         Follow-up Information    Follow up with Warren Danes, [name] On  01/19/2016.   Specialty:  Cardiology   Why:  Cardiology Follow-Up on 01/19/2016 with Dr. Mare Ferrari at 3:00PM.   Contact information:   [address] [place] [address] [phone]       [place]  Time spent with patient to include physician time: 40 minutes Signed: Erma Heritage, PA 12/07/2015, 7:34 PM Co-Sign [name]   Attending Note:   The patient was seen and examined.  Agree with assessment and plan as noted above.  Changes made to the above note as needed.  See my note from day of discharge. Doing well from cardiac standpoint.  On torsemide instead of lasix.    Aldactone to be added in a week    [name], [name] Bonito., [name], [facility] 12/08/2015, 10:05 AM [address]  [place] Pager [phone]

## 2015-12-07 NOTE — Consult Note (Signed)
[name] for Infectious Disease  Date of Admission:  12/02/2015  Date of Consult:  12/07/2015  Reason for Consult: C diff toxin+ Referring Physician: Michail Sermon  Impression/Recommendation C diff toxin positive PCR negative Diarrhea  Comment- Would await his Bx result.  Check stool O & P and AFB.  Check his well water for pathgens, drink city or bottle water only.   He and his wife are interested in treatment for C diff. I am not adverse to this but cannot be sure it will help. It certainly has the opportunity to harm- if he is given po vanco it will increase his risk of VRE. If he is given flagyl, it interacts with many drugs and tastes horrible.  I can understand their frustration, this has significantly affected his ADLs. He has ha multiple incontinent episodes.   His flex sig did not confirm presence of pseudomembranes. His test pattern is consistent with colonization. I would not treat him for C diff unless he has change in WBC, fever, worsened diarrhea.   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   He was adm on 12-9 with volume overload. He also has a hx of chronic diarrhea on chronic lomotil. He had colonoscopy 01-2014 (no colitis, +sub-cm polyps).  His diarrhea has been worsening over the last month. He was afebrile on adm, with a normal WBC. BNP 1383.  There is some concern that his diarrhea is related to his anti-coagulation- this was changed from xarelto to eliquis. He had C diff toxi+ but Ag and toxin both negative on tool pathogen panel.  and was started on po vanco on 12-13 by CV.  Stopped same day.   His wife relates his diarrhea to having received  around an injury.   They have well water. No other sick contacts.  No  change in wt outside of recent diuresis.    Past Medical History  Diagnosis Date  . Coronary heart disease     stent, CABG, RBBB  . Valvular heart disease     aortic stenosis/regurgitation  . 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 Hypercalcemia 2011    due to sarcoidiosis  . Bone marrow disease   . Diabetes mellitus   . Hemorrhagic cystitis 2012  . Gout   . HTN (hypertension)   . A-fib ([place])     permanent with tachy-brady syndrome  . Myocardial infarction [facility])     1995, 2002, 2006  . OSA (obstructive sleep apnea)     use bipap setting of 10 and 12  . Complication of anesthesia 11-16-13    required alot of versed per anesthesia with cataract surgery  . Chronic kidney disease (CKD), stage III (moderate)     lov note dr Koleen Nimrod nephrology 09-17-13 on chart  . Atrial flutter ([facility])     hx of  . CHF (congestive heart failure) ([place])   . Pneumonia 1966, 2011    hx of  . Anxiety   . Osteoarthritis   . DDD (degenerative disc disease)   . Synovial cyst of lumbar spine     l3-l4, l4-l5, injections  . Anemia   . Hepatitis     mono  . Cancer ([place])  basal cell  . Right sciatic nerve pain     for block thursday 01-21-2014, injections  . Fungal toenail infection   . Biceps tendon tear     right  . Diabetes ([place])   . Shortness of breath dyspnea   . Presence of permanent cardiac pacemaker     Past Surgical History  Procedure Laterality Date  . Cataract surgery Right 2007  . Transurethral resection of prostate  oct. 2012    TURP  . Vasectomy  1977  . Redo median sternotomy, extracorporeal cirulation, avr, tricuspid valve repair  06/13/2011    AVR(23-mm Edwards pericardial Magna-Ease valve./ TVrepair (34-mm Edwards MC3 annuloplasty ring  . Coronary artery bypass graft  2006    x 5  . Cataract surgery Left 11-16-13  . Cardioversion N/A 12/30/2013    Procedure: CARDIOVERSION;  Surgeon: [name], MD;  Location: [facility] ENDOSCOPY;  Service: Cardiovascular;   Laterality: N/A;  10:49 cardioversion at 120 joules, then 150 joules, to SB  used  Lido 7m,  Propofol 160 mcg  . Colonoscopy N/A 01/29/2014    Procedure: COLONOSCOPY;  Surgeon: JWinfield Cunas, MD;  [facility];  Service: Endoscopy;  Laterality: N/A;  amanda//ja  . Cardioversion  2003, 2006, 2012, 2013  . Portacath placement    . Portacath removed    . Basal cell carcinoma excision Right 2015    ear  . Total knee arthroplasty Right 05/24/2014    Procedure: RIGHT TOTAL KNEE ARTHROPLASTY;  Surgeon: FGearlean Alf MD;  Location: [facility] ORS;  Service: Orthopedics;  Laterality: Right;  . Ep implantable device N/A 05/05/2015    Procedure: Pacemaker Implant;  Surgeon: JThompson Grayer MD;  [facility];  Service: Cardiovascular;  Laterality: N/A;  . Insert / replace / remove pacemaker  05/05/2015     Allergies  Allergen Reactions  . Actos [Pioglitazone Hydrochloride] Swelling and Other (See Comments)    Severe peripheral edema  . Codeine Other (See Comments)    Makes Pt aggressive  . Depakote [Divalproex Sodium] Diarrhea    severe  . Diltiazem Other (See Comments)    lethargy and dyspnea  . Hydralazine Other (See Comments)    Severe chills and SOB   . Isosorbide Dinitrate Other (See Comments)    Severe chills and SOB   . Januvia [Sitagliptin] Diarrhea and Other (See Comments)    bradycardia  . Loop Diuretics Other (See Comments)    Spike in BUN and creatine levels  . Losartan Other (See Comments)    aphasia  . Nsaids Other (See Comments)    Renal problems  . Onglyza [Saxagliptin] Diarrhea and Other (See Comments)    bradycardia  . Orudis [Ketoprofen] Other (See Comments)    Cannot take due to renal insufficiency  . Penicillins Swelling and Other (See Comments)    Serum sickness  . Potassium Iodide Itching and Rash    Severe entire body itching and rash  . Rozerem [Ramelteon] Diarrhea    Severe   . Amiodarone Other (See Comments)    diarrhea  . Latex  Swelling  . Verapamil Other (See Comments)    Severe fatigue  . Benazepril Hcl Other (See Comments)    ? Possible lowers plateletes  . Indomethacin Other (See Comments)    Renal problems   . Minocycline Other (See Comments)    Makes dizzy and feel just lousy.  .Marland KitchenPentazocine Lactate Other (See Comments)    Unknown allergic reaction - pt and wife  do not recall this  . Poison Ivy Extract [Extract Of Poison Ivy] Rash and Other (See Comments)    blisters  . Sertraline Hcl Other (See Comments)    Pt and wife do not recall this  . Shellfish Allergy Rash    Medications:  Scheduled: . allopurinol  200 mg Oral Q breakfast  . Barium Sulfate  450 mL Oral BID  . insulin aspart  0-20 Units Subcutaneous TID WC  . insulin glargine  2 Units Subcutaneous QPC breakfast  . magnesium oxide  400 mg Oral Daily  . metoprolol  75 mg Oral BID  . mupirocin cream   Topical BID  . sodium chloride  3 mL Intravenous Q12H  . terazosin  10 mg Oral Q breakfast  . torsemide  40 mg Oral Daily  . zolpidem  10 mg Oral QHS    Abtx:  Anti-infectives    Start     Dose/Rate Route Frequency Ordered Stop   12/06/15 1845  vancomycin (VANCOCIN) 50 mg/mL oral solution 125 mg  Status:  Discontinued     125 mg Oral 4 times daily 12/06/15 1841 12/06/15 1904      Total days of antibiotics: 0          Social History:  reports that he quit smoking about 19 years ago. His smoking use included Pipe and Cigars. He has never used smokeless tobacco. He reports that he drinks alcohol. He reports that he does not use illicit drugs.  Family History  Problem Relation Age of Onset  . Heart attack Father   . Stroke Father   . Allergies    . Asthma    . Heart disease    . Cancer    . Stroke Mother     General ROS: no fever or chills. 12 point ros o/w negative , see HPI.   Blood pressure 152/86, pulse 69, temperature 98.1 F (36.7 C), temperature source Oral, resp. rate 15, height 5' 11"  (1.803 m), weight 104.282 kg (229  lb 14.4 oz), SpO2 94 %. General appearance: alert, cooperative and no distress Eyes: negative findings: conjunctivae and sclerae normal and pupils equal, round, reactive to light and accomodation Throat: lips, mucosa, and tongue normal; teeth and gums normal Neck: no adenopathy and supple, symmetrical, trachea midline Lungs: clear to auscultation bilaterally Heart: regular rate and rhythm Abdomen: normal findings: bowel sounds normal and soft, non-tender and abnormal findings:  distended Extremities: edema mild LLE > RLE.    Results for orders placed or performed during the hospital encounter of 12/02/15 (from the past 48 hour(s))  Glucose, capillary     Status: None   Collection Time: 12/05/15  4:37 PM  Result Value Ref Range   Glucose-Capillary 98 65 - 99 mg/dL  Glucose, capillary     Status: Abnormal   Collection Time: 12/05/15  9:12 PM  Result Value Ref Range   Glucose-Capillary 149 (H) 65 - 99 mg/dL   Comment 1 Notify RN    Comment 2 Document in Chart   Renal function panel     Status: Abnormal   Collection Time: 12/06/15  3:43 AM  Result Value Ref Range   Sodium 136 135 - 145 mmol/L   Potassium 3.3 (L) 3.5 - 5.1 mmol/L   Chloride 97 (L) 101 - 111 mmol/L   CO2 29 22 - 32 mmol/L   Glucose, Bld 107 (H) 65 - 99 mg/dL   BUN 30 (H) 6 - 20 mg/dL   Creatinine, Ser 2.36 (  H) 0.61 - 1.24 mg/dL   Calcium 9.2 8.9 - 10.3 mg/dL   Phosphorus 3.1 2.5 - 4.6 mg/dL   Albumin 3.5 3.5 - 5.0 g/dL   GFR calc non Af Amer 25 (L) >60 mL/min   GFR calc Af Amer 29 (L) >60 mL/min    Comment: (NOTE) The eGFR has been calculated using the CKD EPI equation. This calculation has not been validated in all clinical situations. eGFR's persistently <60 mL/min signify possible Chronic Kidney Disease.    Anion gap 10 5 - 15  CBC     Status: Abnormal   Collection Time: 12/06/15  3:43 AM  Result Value Ref Range   WBC 4.0 4.0 - 10.5 K/uL   RBC 4.68 4.22 - 5.81 MIL/uL   Hemoglobin 11.6 (L) 13.0 - 17.0  g/dL   HCT 37.1 (L) 39.0 - 52.0 %   MCV 79.3 78.0 - 100.0 fL   MCH 24.8 (L) 26.0 - 34.0 pg   MCHC 31.3 30.0 - 36.0 g/dL   RDW 20.5 (H) 11.5 - 15.5 %   Platelets 99 (L) 150 - 400 K/uL  Glucose, capillary     Status: None   Collection Time: 12/06/15  6:08 AM  Result Value Ref Range   Glucose-Capillary 87 65 - 99 mg/dL   Comment 1 Notify RN    Comment 2 Document in Chart   Glucose, capillary     Status: Abnormal   Collection Time: 12/06/15 11:32 AM  Result Value Ref Range   Glucose-Capillary 208 (H) 65 - 99 mg/dL  Glucose, capillary     Status: Abnormal   Collection Time: 12/06/15  4:29 PM  Result Value Ref Range   Glucose-Capillary 144 (H) 65 - 99 mg/dL  Glucose, capillary     Status: Abnormal   Collection Time: 12/06/15  9:41 PM  Result Value Ref Range   Glucose-Capillary 131 (H) 65 - 99 mg/dL  Renal function panel     Status: Abnormal   Collection Time: 12/07/15  3:33 AM  Result Value Ref Range   Sodium 136 135 - 145 mmol/L   Potassium 3.6 3.5 - 5.1 mmol/L   Chloride 98 (L) 101 - 111 mmol/L   CO2 29 22 - 32 mmol/L   Glucose, Bld 103 (H) 65 - 99 mg/dL   BUN 28 (H) 6 - 20 mg/dL   Creatinine, Ser 2.37 (H) 0.61 - 1.24 mg/dL   Calcium 9.3 8.9 - 10.3 mg/dL   Phosphorus 3.1 2.5 - 4.6 mg/dL   Albumin 3.6 3.5 - 5.0 g/dL   GFR calc non Af Amer 25 (L) >60 mL/min   GFR calc Af Amer 29 (L) >60 mL/min    Comment: (NOTE) The eGFR has been calculated using the CKD EPI equation. This calculation has not been validated in all clinical situations. eGFR's persistently <60 mL/min signify possible Chronic Kidney Disease.    Anion gap 9 5 - 15  Glucose, capillary     Status: None   Collection Time: 12/07/15  6:34 AM  Result Value Ref Range   Glucose-Capillary 88 65 - 99 mg/dL  Glucose, capillary     Status: Abnormal   Collection Time: 12/07/15 11:42 AM  Result Value Ref Range   Glucose-Capillary 103 (H) 65 - 99 mg/dL   Comment 1 Notify RN    Comment 2 Document in Chart         Component Value Date/Time   SDES STOOL 12/03/2015 2018   [place] NONE 12/03/2015 2018  CULT  05/27/2014 0252    Multiple bacterial morphotypes present, none predominant. Suggest appropriate recollection if clinically indicated. Performed at [place] 12/06/2015 FINAL 12/03/2015 2018   Ct Abdomen Pelvis Wo Contrast  12/06/2015  CLINICAL DATA:  Abdominal pain. Progressive diarrhea over the past 18 months. Ascites. EXAM: CT ABDOMEN AND PELVIS WITHOUT CONTRAST TECHNIQUE: Multidetector CT imaging of the abdomen and pelvis was performed following the standard protocol without IV contrast. COMPARISON:  Plain films 05/29/2014.  Most recent CT of 01/19/2011 FINDINGS: Lower chest: Clear lung bases. Moderate cardiomegaly with incompletely imaged pacer. Median sternotomy with Coronary artery atherosclerosis. Mediastinal and bilateral hilar calcified nodes are consistent with old granulomatous disease. Small right and trace left pleural effusions. Hepatobiliary: Mild, heterogeneous hepatic steatosis. Multiple gallstones without acute cholecystitis or biliary duct dilatation. Pancreas: Normal, without mass or ductal dilatation. Spleen: Normal in size, without focal abnormality. Adrenals/Urinary Tract: Normal adrenal glands. Bilateral fluid density renal lesions which are most consistent with cysts. No renal calculi or hydronephrosis. No hydroureter or ureteric calculi. Equivocal mild bladder wall thickening. Stomach/Bowel: Normal stomach, without wall thickening. Normal colon, appendix, and terminal ileum. Normal small bowel. Vascular/Lymphatic: Advanced aortic and branch vessel atherosclerosis. No abdominopelvic adenopathy. Reproductive: Moderate prostatomegaly. Other: Small volume abdominal pelvic ascites. Musculoskeletal: No acute osseous abnormality. Disc bulges at multiple levels, including L4-5 and L5-S1. IMPRESSION: 1.  No acute process in the abdomen or pelvis. 2. Small bilateral  pleural effusions with small volume abdominal pelvic ascites. Question fluid overload. 3. Cholelithiasis. 4. Prostatomegaly with equivocal bladder wall thickening, possibly related to a component of bladder outlet obstruction. 5. Advanced atherosclerosis, status post median sternotomy. 6. Heterogeneous hepatic steatosis. Electronically Signed   By: [name] M.D.   On: 12/06/2015 17:34   Recent Results (from the past 240 hour(s))  C difficile quick scan w PCR reflex     Status: None   Collection Time: 12/03/15  8:18 PM  Result Value Ref Range Status   C Diff antigen NEGATIVE NEGATIVE Final   C Diff toxin NEGATIVE NEGATIVE Final   C Diff interpretation Negative for toxigenic C. difficile  Final      12/07/2015, 3:01 PM     LOS: 5 days    Records and images were personally reviewed where available.

## 2015-12-07 NOTE — Interval H&P Note (Signed)
History and Physical Interval Note:  12/07/2015 9:14 AM  [name] Pearson II, MD  has presented today for surgery, with the diagnosis of diarrhea  The various methods of treatment have been discussed with the patient and family. After consideration of risks, benefits and other options for treatment, the patient has consented to  Procedure(s) with comments: FLEXIBLE SIGMOIDOSCOPY (N/A) - Unprepped as a surgical intervention .  The patient's history has been reviewed, patient examined, no change in status, stable for surgery.  I have reviewed the patient's chart and labs.  Questions were answered to the patient's satisfaction.     [name]

## 2015-12-08 ENCOUNTER — Encounter (HOSPITAL_COMMUNITY): Payer: Self-pay | Admitting: Gastroenterology

## 2015-12-08 NOTE — Op Note (Signed)
[facility] [place], [id number]   FLEXIBLE SIGMOIDOSCOPY PROCEDURE REPORT  PATIENT: [name], [name]  MR#: [id number] BIRTHDATE: 1941-09-30 , 70  yrs. old GENDER: male ENDOSCOPIST: Wilford Corner, MD REFERRED BY: PROCEDURE DATE:  12/07/2015 PROCEDURE:   Sigmoidoscopy with biopsy ASA CLASS:   Class III INDICATIONS:chronic diarrhea. MEDICATIONS: Fentanyl 100 mcg IV and Versed 10 mg IV  DESCRIPTION OF PROCEDURE:   After the risks benefits and alternatives of the procedure were thoroughly explained, informed consent was obtained.  Digital exam revealed no abnormalities of the rectum. The [id number]  endoscope was introduced through the anus  and advanced to the descending colon (50 cm from the anus) , The exam was Without limitations.    The quality of the prep was none      . Estimated blood loss is zero unless otherwise noted in this procedure report. The instrument was then slowly withdrawn as the mucosa was fully examined.       Solid and semi-solid stool noted throughout the examined colon. Visualized mucosa was normal in appearance but most of the colon was obscured by solid and semi-solid stool that could not be irrigated away. Random colon biopsies taken to send for histology.         Retroflexed views revealed small internal hemorrhoids.    The scope was then withdrawn from the patient and the procedure terminated.  COMPLICATIONS: There were no immediate complications.  ENDOSCOPIC IMPRESSION: Solid and semi-solid stool noted in examined colon limiting mucosal views (see above) Small internal hemorrhoids No pseudomembranes seen  RECOMMENDATIONS: F/U on path; Resume anticoagulation in 24 hours; Advance diet  REPEAT EXAM:  eSigned:  Wilford Corner, MD 12/07/2015 9:48 AM   CC:

## 2015-12-14 LAB — PANCREATIC ELASTASE, FECAL: PANCREATIC ELASTASE-1, STL: 414 ug Elast./g (ref >200)

## 2015-12-20 ENCOUNTER — Ambulatory Visit (INDEPENDENT_AMBULATORY_CARE_PROVIDER_SITE_OTHER): Payer: Medicare Other | Admitting: *Deleted

## 2015-12-20 DIAGNOSIS — I495 Sick sinus syndrome: Secondary | ICD-10-CM

## 2015-12-21 NOTE — Progress Notes (Signed)
Remote pacemaker transmission.   

## 2016-01-06 LAB — CUP PACEART REMOTE DEVICE CHECK
Battery Remaining Longevity: 115 mo
Battery Remaining Percentage: 95.5 %
Battery Voltage: 3.01 V
Brady Statistic AS VS Percent: 0 %
Implantable Lead Location: 753859
Implantable Lead Location: 753860
Lead Channel Impedance Value: 450 Ohm
Lead Channel Pacing Threshold Pulse Width: 0.5 ms
Lead Channel Setting Pacing Amplitude: 2.5 V
Lead Channel Setting Pacing Pulse Width: 0.5 ms
MDC IDC LEAD IMPLANT DT: 20160512
MDC IDC LEAD IMPLANT DT: 20160512
MDC IDC LEAD MODEL: 1948
MDC IDC MSMT LEADCHNL RA SENSING INTR AMPL: 3.7 mV
MDC IDC MSMT LEADCHNL RV IMPEDANCE VALUE: 650 Ohm
MDC IDC MSMT LEADCHNL RV PACING THRESHOLD AMPLITUDE: 1.25 V
MDC IDC MSMT LEADCHNL RV SENSING INTR AMPL: 6.5 mV
MDC IDC SESS DTM: 20161227105029
MDC IDC SET LEADCHNL RA PACING AMPLITUDE: 2 V
MDC IDC SET LEADCHNL RV SENSING SENSITIVITY: 2 mV
MDC IDC STAT BRADY AP VP PERCENT: 0 %
MDC IDC STAT BRADY AP VS PERCENT: 0 %
MDC IDC STAT BRADY AS VP PERCENT: 100 %
MDC IDC STAT BRADY RA PERCENT PACED: 1 %
MDC IDC STAT BRADY RV PERCENT PACED: 55 %
Pulse Gen Model: 2240
Pulse Gen Serial Number: 7759894

## 2016-01-11 ENCOUNTER — Encounter: Payer: Self-pay | Admitting: Cardiology

## 2016-01-19 ENCOUNTER — Ambulatory Visit (INDEPENDENT_AMBULATORY_CARE_PROVIDER_SITE_OTHER): Payer: Medicare Other | Admitting: Cardiology

## 2016-01-19 ENCOUNTER — Encounter: Payer: Self-pay | Admitting: Cardiology

## 2016-01-19 VITALS — BP 130/68 | HR 68 | Ht 71.0 in | Wt 239.8 lb

## 2016-01-19 DIAGNOSIS — I495 Sick sinus syndrome: Secondary | ICD-10-CM | POA: Diagnosis not present

## 2016-01-19 DIAGNOSIS — Z954 Presence of other heart-valve replacement: Secondary | ICD-10-CM | POA: Diagnosis not present

## 2016-01-19 DIAGNOSIS — Z952 Presence of prosthetic heart valve: Secondary | ICD-10-CM

## 2016-01-19 MED ORDER — TERAZOSIN HCL 5 MG PO CAPS: 5.0000 mg | ORAL_CAPSULE | Freq: Every day | ORAL | Status: DC

## 2016-01-19 NOTE — Progress Notes (Signed)
Cardiology Office Note   Date:  01/19/2016   ID:  [name] Fiddler, [name], DOB Dec 16, 1941, MRN [id number]  PCP:  [place], BETTY G, [name]  Cardiologist: [name] [name]  Chief Complaint  Patient presents with  . Follow-up    Denies cp/sob/le edema/claudication      History of Present Illness: [name] Fiddler, [name] is a 75 y.o. male who presents for scheduled follow-up visit  [name] Pearson [name], [name] is a 75 y.o. male, retired internal medicine physician, with a hx of CAD, status post CABG in 2006, aortic stenosis and tricuspid regurgitation status post bioprosthetic AVR and tricuspid valve repair in 05/2011, chronic atrial fibrillation, chronic kidney disease, diabetes, anxiety. He is on long-term anticoagulation with Xarelto. He has had multiple cardioversions in the past. His last cardioversion was in January 2015. 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 Echocardiogram in June 2015 demonstrated normal LV function, moderate aortic stenosis. Repeat echocardiogram in 11/2014 demonstrated normal LV function, severe LVH, bioprosthetic AVR with mean gradient of 29 and moderate LAE. He had been admitted in March 2016 after a mechanical fall resulting in fractured seventh rib on the right. He returned to the hospital with acute renal failure and idioventricular rhythm with heart rate as low as 24. Hospitalization was complicated by Mallory-Weiss tear. He improved with hydration. He was recently seen 04/26/15. He was back in atrial flutter with slow ventricular response. He was referred to EP.  He subsequently underwent implantation of pacemaker by Dr. Rayann Heman. He presented to the office in December 2016 with acute on chronic diastolic and systolic heart failure with volume overload.  He was hospitalized.  He responded to IV diuresis.  He also had problems with C. difficile colitis Since discharge from the hospital he has been followed closely by nephrology.  His most recent BUN last week  was 91 with creatinine of 3.2.  At that point he was placed on a reduced dose of torsemide 40 mg one day alternating with 20 mg the next.  Since his hospitalization in December 2016 he has lost 21 pounds.  His previous severe ascites has improved.  He has not been experiencing any chest pain or angina.  From a functional standpoint he has been stable.  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   Past Medical History  Diagnosis Date  . Coronary heart disease     stent, CABG, RBBB  . Valvular heart disease     aortic stenosis/regurgitation  . 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 Hypercalcemia 2011    due to sarcoidiosis  . Bone marrow disease   . Diabetes mellitus   . Hemorrhagic cystitis 2012  . Gout   . HTN (hypertension)   . A-fib ([place])     permanent with tachy-brady syndrome  . Myocardial infarction [facility])     1995, 2002, 2006  . OSA (obstructive sleep apnea)     use bipap setting of 10 and 12  . Complication of anesthesia 11-16-13    required alot of versed per anesthesia with cataract surgery  . Chronic kidney disease (CKD), stage III (moderate)     lov note dr Koleen Nimrod nephrology 09-17-13 on chart  . Atrial flutter (HCC)     hx of  . CHF (congestive heart failure) ([place])   . Pneumonia 1966, 2011    hx of  . Anxiety   . Osteoarthritis   . DDD (  degenerative disc disease)   . Synovial cyst of lumbar spine     l3-l4, l4-l5, injections  . Anemia   . Hepatitis     mono  . Cancer ([place])     basal cell  . Right sciatic nerve pain     for block thursday 01-21-2014, injections  . Fungal toenail infection   . Biceps tendon tear     right  . Diabetes ([place])   . Shortness of breath dyspnea   . Presence of permanent cardiac pacemaker     Past Surgical History  Procedure Laterality Date  . Cataract surgery Right 2007  . Transurethral resection of prostate  oct. 2012    TURP  . Vasectomy  1977  . Redo median sternotomy, extracorporeal  cirulation, avr, tricuspid valve repair  06/13/2011    AVR(23-mm Edwards pericardial Magna-Ease valve./ TVrepair (34-mm Edwards MC3 annuloplasty ring  . Coronary artery bypass graft  2006    x 5  . Cataract surgery Left 11-16-13  . Cardioversion N/A 12/30/2013    Procedure: CARDIOVERSION;  Surgeon: [name], [name];  Location: [facility]COPY;  Service: Cardiovascular;  Laterality: N/A;  10:49 cardioversion at 120 joules, then 150 joules, to SB  used  Lido 16m,  Propofol 160 mcg  . Colonoscopy N/A 01/29/2014    Procedure: COLONOSCOPY;  Surgeon: JWinfield Cunas, [name];  [facility];  Service: Endoscopy;  Laterality: N/A;  amanda//ja  . Cardioversion  2003, 2006, 2012, 2013  . Portacath placement    . Portacath removed    . Basal cell carcinoma excision Right 2015    ear  . Total knee arthroplasty Right 05/24/2014    Procedure: RIGHT TOTAL KNEE ARTHROPLASTY;  Surgeon: FGearlean Alf [name];  [facility];  Service: Orthopedics;  Laterality: Right;  . Ep implantable device N/A 05/05/2015    Procedure: Pacemaker Implant;  Surgeon: JThompson Grayer [name];  [facility];  Service: Cardiovascular;  Laterality: N/A;  . Insert / replace / remove pacemaker  05/05/2015  . Flexible sigmoidoscopy N/A 12/07/2015    Procedure: FLEXIBLE SIGMOIDOSCOPY;  Surgeon: VWilford Corner [name];  [facility];  Service: Endoscopy;  Laterality: N/A;  Unprepped     Current Outpatient Prescriptions  Medication Sig Dispense Refill  . acetaminophen (TYLENOL) 500 MG tablet Take 1,000 mg by mouth 2 (two) times daily as needed (pain).     .Marland Kitchenallopurinol (ZYLOPRIM) 100 MG tablet Take 200 mg by mouth daily with breakfast.     . diazepam (VALIUM) 10 MG tablet Take 1 tablet by mouth daily as needed for anxiety or sleep. Ankle and foot cramps  0  . diphenhydrAMINE (BENADRYL) 25 MG tablet Take 25 mg by mouth every 6 (six) hours as needed for allergies.    . diphenoxylate-atropine (LOMOTIL) 2.5-0.025 MG  per tablet Take 1-2 tablets by mouth daily as needed. stomach  1  . insulin glargine (LANTUS) 100 unit/mL SOPN Inject 2-3 Units into the skin daily after breakfast. CBG 120-140 2 units, 140-160 3 units    . metoprolol (LOPRESSOR) 50 MG tablet Take 1 tablet (50 mg total) by mouth 2 (two) times daily. 60 tablet 6  . metoprolol tartrate (LOPRESSOR) 25 MG tablet Take 1 tablet (25 mg total) by mouth 2 (two) times daily. 60 tablet 6  . metroNIDAZOLE (FLAGYL) 500 MG tablet Take 500 mg by mouth 4 (four) times daily.  0  . OVER THE COUNTER MEDICATION Take 125 mg by mouth daily. Magnesium    .  Testosterone Cypionate 200 MG/ML KIT Inject 1 mL into the muscle every 14 (fourteen) days.    [name] torsemide (DEMADEX) 20 MG tablet Take by mouth as directed. Take 40 mg by mouth every other day and take 20 mg by mouth every other day, alternating days    . XARELTO 15 MG TABS tablet Take 15 mg by mouth every evening.  0  . zolpidem (AMBIEN) 10 MG tablet Take 10 mg by mouth at bedtime.     [name] terazosin (HYTRIN) 5 MG capsule Take 1 capsule (5 mg total) by mouth at bedtime. 30 capsule 0   No current facility-administered medications for this visit.    Allergies:   Actos; Codeine; Depakote; Diltiazem; Hydralazine; Isosorbide dinitrate; Januvia; Loop diuretics; Losartan; Nsaids; Onglyza; Orudis; Penicillins; Potassium iodide; Rozerem; Amiodarone; Latex; Verapamil; Benazepril hcl; Indomethacin; Minocycline; Pentazocine lactate; Poison ivy extract; Sertraline hcl; and Shellfish allergy    Social History:  The patient  reports that he quit smoking about 20 years ago. His smoking use included Pipe and Cigars. He has never used smokeless tobacco. He reports that he drinks alcohol. He reports that he does not use illicit drugs.   Family History:  The patient's family history includes Heart attack in his father; Stroke in his father and mother.    ROS:  Please see the history of present illness.   Otherwise, review of systems are  positive for none.   All other systems are reviewed and negative.    PHYSICAL EXAM: VS:  BP 130/68 mmHg  Pulse 68  Ht 5' 11"  (1.803 m)  Wt 239 lb 12.8 oz (108.773 kg)  BMI 33.46 kg/m2 , BMI Body mass index is 33.46 kg/(m^2). GEN: Well nourished, well developed, in no acute distress HEENT: normal Neck: no JVD, carotid bruits, or masses Cardiac: Paced ventricular rhythm.  Underlying atrial fibrillation.  Grade 2/6 systolic ejection murmur at the aortic area.  No diastolic murmur. Respiratory:  clear to auscultation bilaterally, normal work of breathing GI: soft, nontender, nondistended, + BS MS: no deformity or atrophy Skin: warm and dry, no rash Neuro:  Strength and sensation are intact Psych: euthymic mood, full affect   EKG:  EKG is ordered today. The ekg ordered today demonstrates ventricular paced rhythm at 68 bpm.  Underlying atrial fibrillation   Recent Labs: 12/02/2015: ALT 15*; B Natriuretic Peptide 1383.6* 12/03/2015: TSH 2.745 12/06/2015: Hemoglobin 11.6*; Platelets 99* 12/07/2015: BUN 28*; Creatinine, Ser 2.37*; Potassium 3.6; Sodium 136    Lipid Panel    Component Value Date/Time   CHOL 106 05/28/2014 0140   TRIG 169* 05/28/2014 0140   HDL 14* 05/28/2014 0140   CHOLHDL 7.6 05/28/2014 0140   VLDL 34 05/28/2014 0140   LDLCALC 58 05/28/2014 0140      Wt Readings from Last 3 Encounters:  01/19/16 239 lb 12.8 oz (108.773 kg)  12/07/15 229 lb 14.4 oz (104.282 kg)  12/02/15 260 lb 12.8 oz (118.298 kg)        ASSESSMENT AND PLAN:  1. Acute on Chronic diastolic CHF (congestive heart failure): Improved on current therapy  2. Coronary artery disease: s/p CABG in 2006. Continue beta blocker. He is not on ASA as he is on Xarelto. He is on the appropriate reduced dose of Xarelto because of his renal insufficiency  3. VALVULAR HEART DISEASE s/p Bioprosthetic AVR and TV Repair: Stable AVR on recent echo. Continue SBE prophylaxis.   4. Sick sinus  syndrome S/P placement of cardiac pacemaker: FU with EP as planned.  5. Persistent AFib/Flutter:  He remains on Xarelto 15 mg QD.   6. Chronic kidney disease (CKD), stage IV followed closely by Dr Vladimir Creeks  7. Ascites: Improved..  8. Diarrhea: Followed by Dr. Oletta Lamas.  The patient is finishing up a course of Flagyl for C. difficile  9. Insomnia: He needs Ambien 10 mg Q night.  10. Leg Cramps: He takes Mg2+ and Valium prn.   Current medicines are reviewed at length with the patient today.  The patient does not have concerns regarding medicines.  The following changes have been made:  We are reducing his generic Hytrin to just 5 mg daily  Labs/ tests ordered today include:   Orders Placed This Encounter  Procedures  . EKG 12-Lead     Disposition:  Return to see Dr. Liam Rogers in 2 months  Signed, [name] [name] 01/19/2016 6:34 PM    [address], [place], [place]  [address] Phone: [phone]; Fax: [phone]

## 2016-01-19 NOTE — Patient Instructions (Addendum)
Medication Instructions:   CONTINUE SAME MEDICATIONS  NEW SCRIPT  WRITTEN    If you need a refill on your cardiac medications before your next appointment, please call your pharmacy.  Labwork: NONE ORDER TODAY    Testing/Procedures:  NONE ORDER TODAY    Follow-Up:  .You have been referred IN 2 MONTHS WITH  Dr.  Acie Fredrickson  be established  as a new Patient    Any Other Special Instructions Will Be Listed Below (If Applicable).

## 2016-02-09 ENCOUNTER — Other Ambulatory Visit: Payer: Self-pay | Admitting: Cardiology

## 2016-03-20 ENCOUNTER — Ambulatory Visit (INDEPENDENT_AMBULATORY_CARE_PROVIDER_SITE_OTHER): Payer: Medicare Other | Admitting: *Deleted

## 2016-03-20 DIAGNOSIS — I495 Sick sinus syndrome: Secondary | ICD-10-CM

## 2016-03-20 NOTE — Progress Notes (Signed)
Remote pacemaker transmission.   

## 2016-03-22 ENCOUNTER — Encounter: Payer: Self-pay | Admitting: Cardiovascular Disease

## 2016-03-22 ENCOUNTER — Ambulatory Visit (INDEPENDENT_AMBULATORY_CARE_PROVIDER_SITE_OTHER): Payer: Medicare Other | Admitting: Cardiovascular Disease

## 2016-03-22 VITALS — BP 180/90 | HR 68 | Ht 71.0 in | Wt 245.6 lb

## 2016-03-22 DIAGNOSIS — I5032 Chronic diastolic (congestive) heart failure: Secondary | ICD-10-CM | POA: Diagnosis not present

## 2016-03-22 MED ORDER — NITROGLYCERIN 2 % TD OINT: 1.0000 [in_us] | TOPICAL_OINTMENT | Freq: Four times a day (QID) | TRANSDERMAL | Status: DC

## 2016-03-22 NOTE — Patient Instructions (Addendum)
Medication Instructions:  Your physician recommends that you continue on your current medications as directed. Please refer to the Current Medication list given to you today.   Labwork: None Ordered   Testing/Procedures: None Ordered   Follow-Up: Your physician recommends that you schedule a follow-up appointment in: 3 months with Dr. Nahser   If you need a refill on your cardiac medications before your next appointment, please call your pharmacy.   Thank you for choosing [facility]! Michelle Swinyer, RN [phone]    

## 2016-03-22 NOTE — Progress Notes (Signed)
Cardiology Office Note   Date:  03/22/2016   ID:  [name] Fiddler, [name], DOB [birth year], MRN [id number]  PCP:  [place], BETTY G, [name]  Cardiologist:   Thayer Headings, [name] , previous [facility] patient   Chief Complaint  Patient presents with  . Follow-up    diastolic chf      History of Present Illness: [name] Fiddler, [name] is a 75 y.o. 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 Able to sing without any difficulty BP and weight are up due to steroid dosing   Sad - his poodle died this past February 08, 2023.     Past Medical History  Diagnosis Date  . Coronary heart disease     stent, CABG, RBBB  . Valvular heart disease     aortic stenosis/regurgitation  . Sarcoid ([place]) 2011    pulmonary and bone marrow  . Hypercalcemia 2011    due to sarcoidiosis  . Bone marrow disease   . Diabetes mellitus   . Hemorrhagic cystitis 2012  . Gout   . HTN (hypertension)   . A-fib ([place])     permanent with tachy-brady syndrome  . Myocardial infarction [facility])     1995, 2002, 2006  . OSA (obstructive sleep apnea)     use bipap setting of 10 and 12  . Complication of anesthesia 11-16-13    required alot of versed per anesthesia with cataract surgery  . Chronic kidney disease (CKD), stage III (moderate)     lov note dr Koleen Nimrod nephrology 09-17-13 on chart  . Atrial flutter (HCC)     hx of  . CHF (congestive heart failure) ([place])   . Pneumonia 1966, 2011    hx of  . Anxiety   . Osteoarthritis   . DDD (degenerative disc disease)   . Synovial cyst of lumbar spine     l3-l4, l4-l5, injections  . Anemia   . Hepatitis     mono  . Cancer ([place])     basal cell  . Right sciatic nerve pain     for block thursday 01-21-2014, injections  . Fungal toenail infection   . Biceps tendon tear       right  . Diabetes ([place])   . Shortness of breath dyspnea   . Presence of permanent cardiac pacemaker     Past Surgical History  Procedure Laterality Date  . Cataract surgery Right 2007  . Transurethral resection of prostate  oct. 2012    TURP  . Vasectomy  1977  . Redo median sternotomy, extracorporeal cirulation, avr, tricuspid valve repair  06/13/2011    AVR(23-mm Edwards pericardial Magna-Ease valve./ TVrepair (34-mm Edwards MC3 annuloplasty ring  . Coronary artery bypass graft  2006    x 5  . Cataract surgery Left 11-16-13  . Cardioversion N/A 12/30/2013    Procedure: CARDIOVERSION;  Surgeon: [name], [name];  Location: [facility] ENDOSCOPY;  Service: Cardiovascular;  Laterality: N/A;  10:49 cardioversion at 120 joules, then 150 joules, to SB  used  Lido 59m,  Propofol 160 mcg  . Colonoscopy N/A 01/29/2014    Procedure: COLONOSCOPY;  Surgeon: JWinfield Cunas, [name];  [facility];  Service: Endoscopy;  Laterality: N/A;  amanda//ja  . Cardioversion  2003,  2006, 2012, 2013  . Portacath placement    . Portacath removed    . Basal cell carcinoma excision Right 2015    ear  . Total knee arthroplasty Right 05/24/2014    Procedure: RIGHT TOTAL KNEE ARTHROPLASTY;  Surgeon: Gearlean Alf, [name];  [facility];  Service: Orthopedics;  Laterality: Right;  . Ep implantable device N/A 05/05/2015    Procedure: Pacemaker Implant;  Surgeon: Thompson Grayer, [name];  Location: [place] CV LAB;  Service: Cardiovascular;  Laterality: N/A;  . Insert / replace / remove pacemaker  05/05/2015  . Flexible sigmoidoscopy N/A 12/07/2015    Procedure: FLEXIBLE SIGMOIDOSCOPY;  Surgeon: Wilford Corner, [name];  Location: [facility] ENDOSCOPY;  Service: Endoscopy;  Laterality: N/A;  Unprepped     Current Outpatient Prescriptions  Medication Sig Dispense Refill  . acetaminophen (TYLENOL) 500 MG tablet Take 1,000 mg by mouth 2 (two) times daily as needed (pain).     [name] allopurinol (ZYLOPRIM) 100 MG tablet Take 200  mg by mouth daily with breakfast.     . diazepam (VALIUM) 10 MG tablet Take 1 tablet by mouth daily as needed for anxiety or sleep. Ankle and foot cramps  0  . diphenhydrAMINE (BENADRYL) 25 MG tablet Take 25 mg by mouth every 6 (six) hours as needed for allergies.    . diphenoxylate-atropine (LOMOTIL) 2.5-0.025 MG per tablet Take 1-2 tablets by mouth daily as needed. stomach  1  . glycopyrrolate (ROBINUL) 1 MG tablet Take 0.5 mg by mouth 2 (two) times daily.  0  . insulin glargine (LANTUS) 100 unit/mL SOPN Inject 2-3 Units into the skin daily after breakfast. CBG 120-140 2 units, 140-160 3 units    . metoprolol (LOPRESSOR) 50 MG tablet Take 1 tablet (50 mg total) by mouth 2 (two) times daily. 60 tablet 6  . metoprolol tartrate (LOPRESSOR) 25 MG tablet Take 1 tablet (25 mg total) by mouth 2 (two) times daily. 60 tablet 6  . metroNIDAZOLE (FLAGYL) 500 MG tablet Take 500 mg by mouth 4 (four) times daily.  0  . OVER THE COUNTER MEDICATION Take 125 mg by mouth daily. Magnesium    . terazosin (HYTRIN) 5 MG capsule Take 1 capsule (5 mg total) by mouth at bedtime. 30 capsule 0  . Testosterone Cypionate 200 MG/ML KIT Inject 1 mL into the muscle every 14 (fourteen) days.    [name] torsemide (DEMADEX) 20 MG tablet Take by mouth as directed. Take 40 mg by mouth every other day and take 20 mg by mouth every other day, alternating days    . XARELTO 15 MG TABS tablet TAKE 1 TABLET (15 MG TOTAL) BY MOUTH DAILY. 30 tablet 11  . zolpidem (AMBIEN) 10 MG tablet Take 10 mg by mouth at bedtime.      No current facility-administered medications for this visit.    Allergies:   Actos; Codeine; Depakote; Diltiazem; Hydralazine; Isosorbide dinitrate; Januvia; Loop diuretics; Losartan; Nsaids; Onglyza; Orudis; Penicillins; Potassium iodide; Rozerem; Amiodarone; Latex; Verapamil; Benazepril hcl; Indomethacin; Minocycline; Pentazocine lactate; Poison ivy extract; Sertraline hcl; and Shellfish allergy    Social History:  The  patient  reports that he quit smoking about 20 years ago. His smoking use included Pipe and Cigars. He has never used smokeless tobacco. He reports that he drinks alcohol. He reports that he does not use illicit drugs.   Family History:  The patient's family history includes Heart attack in his father; Stroke in his father and mother.    ROS:  Please  see the history of present illness.    Review of Systems: Constitutional:  denies fever, chills, diaphoresis, appetite change and fatigue.  HEENT: denies photophobia, eye pain, redness, hearing loss, ear pain, congestion, sore throat, rhinorrhea, sneezing, neck pain, neck stiffness and tinnitus.  Respiratory: denies SOB, DOE, cough, chest tightness, and wheezing.  Cardiovascular: denies chest pain, palpitations and leg swelling.  Gastrointestinal: denies nausea, vomiting, abdominal pain, diarrhea, constipation, blood in stool.  Genitourinary: denies dysuria, urgency, frequency, hematuria, flank pain and difficulty urinating.  Musculoskeletal: denies  myalgias, back pain, joint swelling, arthralgias and gait problem.   Skin: denies pallor, rash and wound.  Neurological: denies dizziness, seizures, syncope, weakness, light-headedness, numbness and headaches.   Hematological: denies adenopathy, easy bruising, personal or family bleeding history.  Psychiatric/ Behavioral: denies suicidal ideation, mood changes, confusion, nervousness, sleep disturbance and agitation.       All other systems are reviewed and negative.    PHYSICAL EXAM: VS:  BP 180/90 mmHg  Pulse 68  Ht 5' 11" (1.803 m)  Wt 245 lb 9.6 oz (111.403 kg)  BMI 34.27 kg/m2 , BMI Body mass index is 34.27 kg/(m^2). GEN: Well nourished, well developed, in no acute distress HEENT: normal Neck: no JVD, carotid bruits, or masses Cardiac: RRR; no murmurs, rubs, or gallops,no edema  Respiratory:  clear to auscultation bilaterally, normal work of breathing GI: soft, nontender,  nondistended, + BS MS: no deformity or atrophy Skin: warm and dry, no rash Neuro:  Strength and sensation are intact Psych: normal   EKG:  EKG is not ordered today.   Recent Labs: 12/02/2015: ALT 15*; B Natriuretic Peptide 1383.6* 12/03/2015: TSH 2.745 12/06/2015: Hemoglobin 11.6*; Platelets 99* 12/07/2015: BUN 28*; Creatinine, Ser 2.37*; Potassium 3.6; Sodium 136    Lipid Panel    Component Value Date/Time   CHOL 106 05/28/2014 0140   TRIG 169* 05/28/2014 0140   HDL 14* 05/28/2014 0140   CHOLHDL 7.6 05/28/2014 0140   VLDL 34 05/28/2014 0140   LDLCALC 58 05/28/2014 0140      Wt Readings from Last 3 Encounters:  03/22/16 245 lb 9.6 oz (111.403 kg)  01/19/16 239 lb 12.8 oz (108.773 kg)  12/07/15 229 lb 14.4 oz (104.282 kg)      Other studies Reviewed: Additional studies/ records that were reviewed today include: . Review of the above records demonstrates:    ASSESSMENT AND PLAN:  1.  Chronic diastolic CHF:  Seems to be stable His BP is elevated - likely due to steroid therapy, stress    2. Chroic kidney disese  3. CAD - CABG     Current medicines are reviewed at length with the patient today.  The patient does not have concerns regarding medicines.  The following changes have been made:  no change  Labs/ tests ordered today include:  No orders of the defined types were placed in this encounter.     Disposition:   FU with me in 6 months     Nahser, [name], [name]  03/22/2016 4:08 PM    [facility] [place], [place], [place]  [address] Phone: [phone]; Fax: [phone]   [facility]  [address] [place], [place]  [address] [phone]   Fax [phone]

## 2016-04-19 LAB — CUP PACEART REMOTE DEVICE CHECK
Battery Voltage: 2.99 V
Brady Statistic AS VP Percent: 100 %
Brady Statistic AS VS Percent: 0 %
Date Time Interrogation Session: 20170328093146
Implantable Lead Implant Date: 20160512
Lead Channel Impedance Value: 450 Ohm
Lead Channel Pacing Threshold Pulse Width: 0.5 ms
Lead Channel Sensing Intrinsic Amplitude: 6 mV
Lead Channel Setting Pacing Pulse Width: 0.5 ms
Lead Channel Setting Sensing Sensitivity: 2 mV
MDC IDC LEAD IMPLANT DT: 20160512
MDC IDC LEAD LOCATION: 753859
MDC IDC LEAD LOCATION: 753860
MDC IDC LEAD MODEL: 1948
MDC IDC MSMT BATTERY REMAINING LONGEVITY: 111 mo
MDC IDC MSMT BATTERY REMAINING PERCENTAGE: 95.5 %
MDC IDC MSMT LEADCHNL RA SENSING INTR AMPL: 3.7 mV
MDC IDC MSMT LEADCHNL RV IMPEDANCE VALUE: 630 Ohm
MDC IDC MSMT LEADCHNL RV PACING THRESHOLD AMPLITUDE: 1.25 V
MDC IDC SET LEADCHNL RA PACING AMPLITUDE: 2 V
MDC IDC SET LEADCHNL RV PACING AMPLITUDE: 2.5 V
MDC IDC STAT BRADY AP VP PERCENT: 0 %
MDC IDC STAT BRADY AP VS PERCENT: 0 %
MDC IDC STAT BRADY RA PERCENT PACED: 1 %
MDC IDC STAT BRADY RV PERCENT PACED: 70 %
Pulse Gen Model: 2240
Pulse Gen Serial Number: 7759894

## 2016-04-20 ENCOUNTER — Encounter: Payer: Self-pay | Admitting: Cardiology

## 2016-05-04 ENCOUNTER — Encounter: Payer: Self-pay | Admitting: Cardiology

## 2016-05-14 ENCOUNTER — Ambulatory Visit: Payer: Medicare Other | Admitting: Cardiovascular Disease

## 2016-05-30 ENCOUNTER — Other Ambulatory Visit: Payer: Self-pay

## 2016-05-30 MED ORDER — METOPROLOL TARTRATE 50 MG PO TABS: 50.0000 mg | ORAL_TABLET | Freq: Two times a day (BID) | ORAL | Status: DC

## 2016-05-30 NOTE — Telephone Encounter (Signed)
Patient should only receive Rx for metoprolol 50 BID.  Thank you

## 2016-06-07 ENCOUNTER — Encounter: Payer: Self-pay | Admitting: Cardiovascular Disease

## 2016-06-07 ENCOUNTER — Other Ambulatory Visit: Payer: Self-pay | Admitting: *Deleted

## 2016-06-07 MED ORDER — METOPROLOL TARTRATE 25 MG PO TABS: 25.0000 mg | ORAL_TABLET | Freq: Two times a day (BID) | ORAL | Status: DC

## 2016-06-19 ENCOUNTER — Ambulatory Visit (INDEPENDENT_AMBULATORY_CARE_PROVIDER_SITE_OTHER): Payer: Medicare Other | Admitting: *Deleted

## 2016-06-19 DIAGNOSIS — I495 Sick sinus syndrome: Secondary | ICD-10-CM

## 2016-06-19 LAB — CUP PACEART REMOTE DEVICE CHECK
Battery Remaining Longevity: 117 mo
Battery Remaining Percentage: 95.5 %
Battery Voltage: 2.99 V
Brady Statistic AP VP Percent: 0 %
Brady Statistic AS VS Percent: 0 %
Brady Statistic RV Percent Paced: 74 %
Implantable Lead Implant Date: 20160512
Implantable Lead Location: 753859
Implantable Lead Location: 753860
Lead Channel Impedance Value: 450 Ohm
Lead Channel Impedance Value: 610 Ohm
Lead Channel Pacing Threshold Pulse Width: 0.5 ms
Lead Channel Setting Pacing Amplitude: 2 V
Lead Channel Setting Pacing Amplitude: 2.5 V
MDC IDC LEAD IMPLANT DT: 20160512
MDC IDC LEAD MODEL: 1948
MDC IDC MSMT LEADCHNL RA SENSING INTR AMPL: 3.7 mV
MDC IDC MSMT LEADCHNL RV PACING THRESHOLD AMPLITUDE: 1.25 V
MDC IDC MSMT LEADCHNL RV SENSING INTR AMPL: 6.3 mV
MDC IDC SESS DTM: 20170627061752
MDC IDC SET LEADCHNL RV PACING PULSEWIDTH: 0.5 ms
MDC IDC SET LEADCHNL RV SENSING SENSITIVITY: 2 mV
MDC IDC STAT BRADY AP VS PERCENT: 0 %
MDC IDC STAT BRADY AS VP PERCENT: 100 %
MDC IDC STAT BRADY RA PERCENT PACED: 1 %
Pulse Gen Model: 2240
Pulse Gen Serial Number: 7759894

## 2016-06-19 NOTE — Progress Notes (Signed)
Remote pacemaker transmission.   

## 2016-06-20 ENCOUNTER — Encounter: Payer: Self-pay | Admitting: Cardiology

## 2016-06-21 ENCOUNTER — Ambulatory Visit (INDEPENDENT_AMBULATORY_CARE_PROVIDER_SITE_OTHER): Payer: Medicare Other | Admitting: Cardiovascular Disease

## 2016-06-21 ENCOUNTER — Encounter: Payer: Self-pay | Admitting: Cardiovascular Disease

## 2016-06-21 VITALS — BP 144/80 | HR 64 | Ht 71.0 in | Wt 238.0 lb

## 2016-06-21 DIAGNOSIS — I359 Nonrheumatic aortic valve disorder, unspecified: Secondary | ICD-10-CM

## 2016-06-21 DIAGNOSIS — I5032 Chronic diastolic (congestive) heart failure: Secondary | ICD-10-CM | POA: Diagnosis not present

## 2016-06-21 DIAGNOSIS — I5042 Chronic combined systolic (congestive) and diastolic (congestive) heart failure: Secondary | ICD-10-CM | POA: Diagnosis not present

## 2016-06-21 LAB — BASIC METABOLIC PANEL
BUN: 66 mg/dL — ABNORMAL HIGH (ref 7–25)
CALCIUM: 9.3 mg/dL (ref 8.6–10.3)
CO2: 24 mmol/L (ref 20–31)
CREATININE: 2.67 mg/dL — AB (ref 0.70–1.18)
Chloride: 99 mmol/L (ref 98–110)
GLUCOSE: 178 mg/dL — AB (ref 65–99)
Potassium: 3.8 mmol/L (ref 3.5–5.3)
SODIUM: 137 mmol/L (ref 135–146)

## 2016-06-21 MED ORDER — SPIRONOLACTONE 25 MG PO TABS: 12.5000 mg | ORAL_TABLET | Freq: Every day | ORAL | Status: DC

## 2016-06-21 NOTE — Patient Instructions (Signed)
Medication Instructions:  1) START Aldactone 12.5mg  once daily.  Labwork: Your physician recommends that you return for lab work on Monday (BMET)   Testing/Procedures: Your physician has requested that you have an echocardiogram. Echocardiography is a painless test that uses sound waves to create images of your heart. It provides your doctor with information about the size and shape of your heart and how well your heart's chambers and valves are working. This procedure takes approximately one hour. There are no restrictions for this procedure.   Follow-Up: Your physician recommends that you schedule a follow-up appointment in: 3 months with Dr. Acie Fredrickson.   Any Other Special Instructions Will Be Listed Below (If Applicable).  You have been referred to Dr. Haroldine Laws in our [facility].    If you need a refill on your cardiac medications before your next appointment, please call your pharmacy.

## 2016-06-21 NOTE — Progress Notes (Signed)
Cardiology Office Note   Date:  July 04, 2016   ID:  [name], [name], DOB August 19, 1941, MRN [id number]  PCP:  [name], [name]  Cardiologist:   [name], [name] , previous [name] patient   No chief complaint on file.     History of Present Illness:  Pt was seen with wife , [name] today   [name], [name] is a 75 y.o. 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 Able to sing without any difficulty BP and weight are up due to steroid dosing   Sad - his poodle died this past 02/24/23.   07/04/16:  Doing ok Still gets fatigued easily.   Walking down the main hall at [facility], has to stop 3 times to rest.   Perhaps some slight dizziness,  Has had some vertigo symptoms   No CP   Bp is typically lower .  A bit elevated today    Past Medical History  Diagnosis Date  . Coronary heart disease     stent, CABG, RBBB  . Valvular heart disease     aortic stenosis/regurgitation  . Sarcoid ([name]) 2011    pulmonary and bone marrow  . Hypercalcemia 2011    due to sarcoidiosis  . Bone marrow disease   . Diabetes mellitus   . Hemorrhagic cystitis 2012  . Gout   . HTN (hypertension)   . A-fib ([place])     permanent with tachy-brady syndrome  . Myocardial infarction [facility])     1995, 2002, 2006  . OSA (obstructive sleep apnea)     use bipap setting of 10 and 12  . Complication of anesthesia 11-16-13    required alot of versed per anesthesia with cataract surgery  . Chronic kidney disease (CKD), stage III (moderate)     lov note dr [name] nephrology 09-17-13 on chart  . Atrial flutter (HCC)     hx of  . CHF (congestive heart failure) ([place])   . Pneumonia 1966, 2011    hx of  . Anxiety   . Osteoarthritis   . DDD (degenerative disc disease)   . Synovial cyst of  lumbar spine     l3-l4, l4-l5, injections  . Anemia   . Hepatitis     mono  . Cancer ([place])     basal cell  . Right sciatic nerve pain     for block thursday 01-21-2014, injections  . Fungal toenail infection   . Biceps tendon tear     right  . Diabetes ([place])   . Shortness of breath dyspnea   . Presence of permanent cardiac pacemaker     Past Surgical History  Procedure Laterality Date  . Cataract surgery Right 2007  . Transurethral resection of prostate  oct. 2012    TURP  . Vasectomy  1977  . Redo median sternotomy, extracorporeal cirulation, avr, tricuspid valve repair  06/13/2011    AVR(23-mm Edwards pericardial Magna-Ease valve./ TVrepair (34-mm Edwards MC3 annuloplasty ring  . Coronary artery bypass graft  2006    x 5  . Cataract surgery Left 11-16-13  . Cardioversion N/A 12/30/2013    Procedure: CARDIOVERSION;  Surgeon: [name], [name];  Location: [place];  Service: Cardiovascular;  Laterality: N/A;  10:49 cardioversion at 120 joules, then 150 joules, to SB  used  Lido 62m,  Propofol 160 mcg  . Colonoscopy N/A 01/29/2014    Procedure: COLONOSCOPY;  Surgeon: [name], [name];  [facility];  Service: Endoscopy;  Laterality: N/A;  amanda//ja  . Cardioversion  2003, 2006, 2012, 2013  . Portacath placement    . Portacath removed    . Basal cell carcinoma excision Right 2015    ear  . Total knee arthroplasty Right 05/24/2014    Procedure: RIGHT TOTAL KNEE ARTHROPLASTY;  Surgeon: FGearlean [name] [name];  [facility];  Service: Orthopedics;  Laterality: Right;  . Ep implantable device N/A 05/05/2015    Procedure: Pacemaker Implant;  Surgeon: [name] [name];  [facility];  Service: Cardiovascular;  Laterality: N/A;  . Insert / replace / remove pacemaker  05/05/2015  . Flexible sigmoidoscopy N/A 12/07/2015    Procedure: FLEXIBLE SIGMOIDOSCOPY;  Surgeon: VWilford [name] [name];  [facility];  Service: Endoscopy;  Laterality: N/A;   Unprepped     Current Outpatient Prescriptions  Medication Sig Dispense Refill  . acetaminophen (TYLENOL) 500 MG tablet Take 1,000 mg by mouth 2 (two) times daily as needed (pain).     .Marland Kitchenallopurinol (ZYLOPRIM) 100 MG tablet Take 200 mg by mouth daily with breakfast.     . Carnitine-B5-B6 500-15-5 MG TABS Take 1,500 mg by mouth daily.    . diazepam (VALIUM) 10 MG tablet Take 1 tablet by mouth daily as needed for anxiety or sleep. Ankle and foot cramps  0  . dicyclomine (BENTYL) 10 MG capsule Take 10 mg by mouth 2 (two) times daily.    . diphenhydrAMINE (BENADRYL) 25 MG tablet Take 25 mg by mouth every 6 (six) hours as needed for allergies.    . diphenoxylate-atropine (LOMOTIL) 2.5-0.025 MG per tablet Take 1-2 tablets by mouth daily as needed. stomach  1  . insulin glargine (LANTUS) 100 unit/mL SOPN Inject 2-3 Units into the skin daily after breakfast. CBG 120-140 2 units, 140-160 3 units    . metoprolol (LOPRESSOR) 50 MG tablet Take 1 tablet (50 mg total) by mouth 2 (two) times daily. 60 tablet 0  . metoprolol tartrate (LOPRESSOR) 25 MG tablet Take 1 tablet (25 mg total) by mouth 2 (two) times daily. 180 tablet 1  . nitroGLYCERIN (NITROGLYN) 2 % ointment Apply 1 inch topically 4 (four) times daily. 30 g 3  . terazosin (HYTRIN) 5 MG capsule Take 1 capsule (5 mg total) by mouth at bedtime. 30 capsule 0  . Testosterone Cypionate 200 MG/ML KIT Inject 1 mL into the muscle every 14 (fourteen) days.    .Marland Kitchentorsemide (DEMADEX) 20 MG tablet Take 40 mg by mouth as directed. Take 40 mg by mouth every other day and take 20 mg by mouth every other day, alternating days    . Turmeric 500 MG TABS Take 1,000 mg by mouth at bedtime.    .Alveda Reasons15 MG TABS tablet TAKE 1 TABLET (15 MG TOTAL) BY MOUTH DAILY. 30 tablet 11  . zolpidem (AMBIEN) 10 MG tablet Take 10 mg by mouth at bedtime.      No current facility-administered medications for this visit.    Allergies:   Actos; Codeine; Depakote; Diltiazem;  Hydralazine; Isosorbide dinitrate; Januvia; Loop diuretics; Losartan; Nsaids; Onglyza; Orudis; Penicillins; Potassium iodide; Rozerem; Amiodarone; Latex; Verapamil; Benazepril hcl; Indomethacin; Minocycline; Pentazocine lactate; Poison ivy extract; Sertraline hcl; and Shellfish allergy    Social  History:  The patient  reports that he quit smoking about 20 years ago. His smoking use included Pipe and Cigars. He has never used smokeless tobacco. He reports that he drinks alcohol. He reports that he does not use illicit drugs.   Family History:  The patient's family history includes Heart attack in his father; Stroke in his father and mother.    ROS:  Please see the history of present illness.    Review of Systems: Constitutional:  denies fever, chills, diaphoresis, appetite change and fatigue.  HEENT: denies photophobia, eye pain, redness, hearing loss, ear pain, congestion, sore throat, rhinorrhea, sneezing, neck pain, neck stiffness and tinnitus.  Respiratory: denies SOB, DOE, cough, chest tightness, and wheezing.  Cardiovascular: denies chest pain, palpitations and leg swelling.  Gastrointestinal: denies nausea, vomiting, abdominal pain, diarrhea, constipation, blood in stool.  Genitourinary: denies dysuria, urgency, frequency, hematuria, flank pain and difficulty urinating.  Musculoskeletal: denies  myalgias, back pain, joint swelling, arthralgias and gait problem.   Skin: denies pallor, rash and wound.  Neurological: denies dizziness, seizures, syncope, weakness, light-headedness, numbness and headaches.   Hematological: denies adenopathy, easy bruising, personal or family bleeding history.  Psychiatric/ Behavioral: denies suicidal ideation, mood changes, confusion, nervousness, sleep disturbance and agitation.       All other systems are reviewed and negative.    PHYSICAL EXAM: VS:  BP 144/80 mmHg  Pulse 64  Ht _0  (1.803 m)  Wt 238 lb (107.956 kg)  BMI 33.21 kg/m2 , BMI  Body mass index is 33.21 kg/(m^2). GEN: Well nourished, well developed, in no acute distress HEENT: normal Neck: no JVD, carotid bruits, or masses Cardiac: RRR; no murmurs, rubs, or gallops,no edema  Respiratory:  clear to auscultation bilaterally, normal work of breathing GI: soft, nontender, nondistended, + BS. ++ ascites  MS: no deformity or atrophy Skin: warm and dry, no rash Neuro:  Strength and sensation are intact Psych: normal   EKG:  EKG is not ordered today.   Recent Labs: 12/02/2015: ALT 15*; B Natriuretic Peptide 1383.6* 12/03/2015: TSH 2.745 12/06/2015: Hemoglobin 11.6*; Platelets 99* 12/07/2015: BUN 28*; Creatinine, Ser 2.37*; Potassium 3.6; Sodium 136    Lipid Panel    Component Value Date/Time   CHOL 106 05/28/2014 0140   TRIG 169* 05/28/2014 0140   HDL 14* 05/28/2014 0140   CHOLHDL 7.6 05/28/2014 0140   VLDL 34 05/28/2014 0140   LDLCALC 58 05/28/2014 0140      Wt Readings from Last 3 Encounters:  06/21/16 238 lb (107.956 kg)  03/22/16 245 lb 9.6 oz (111.403 kg)  01/19/16 239 lb 12.8 oz (108.773 kg)      Other studies Reviewed: Additional studies/ records that were reviewed today include: . Review of the above records demonstrates:    ASSESSMENT AND PLAN:  1.  Chronic Combined systolic and diastolic congestive heart failure. His echo card gram this past winter to include reduction of his left ventricle systolic function down to 35-40%. He has known diastolic dysfunction. He's developed cardiorenal syndrome. His creatinine is fairly stable at 2.7.  We had long discussion regarding starting Aldactone. We'll start him at 12.5 mg a day. We'll get a basic metabolic profile in 4 days. I anticipate getting another basic metabolic profile the week after that.  We will repeat his echocardiogram in the next week or so. We will refer him to the heart failure clinic   2. Chroic kidney disese Further plans per nephrology.  3. CAD - CABG  Current  medicines are reviewed at length with the patient today.  The patient does not have concerns regarding medicines.  The following changes have been made:  no change  Labs/ tests ordered today include:  No orders of the defined types were placed in this encounter.     Disposition:   FU with me in 6 months     [name], [name]  06/21/2016 2:31 PM    [address], [place], [place]  [address] Phone: [phone]; Fax: [phone]   [facility]  [address] [place], [place]  [address] [phone]   Fax [phone]

## 2016-06-22 ENCOUNTER — Telehealth (HOSPITAL_COMMUNITY): Payer: Self-pay | Admitting: Cardiology

## 2016-06-22 NOTE — Telephone Encounter (Signed)
As requested NP appointment made for 06/25/16 @ 1140  detailed parking instructions and phone number given to patient Patient/ patients spouse voiced understanding

## 2016-06-22 NOTE — Telephone Encounter (Signed)
-----   Message from Loren Racer, LPN sent at [phone]  3:18 PM EDT ----- Hi Chantel,  One of the ladies here recommended I send you a message about getting this pt scheduled.  Pt did mention that he prefers afternoon appts and that he only wants to see Dr. Haroldine Laws.   Thanks so much [employer]

## 2016-06-25 ENCOUNTER — Other Ambulatory Visit: Payer: Medicare Other

## 2016-06-25 ENCOUNTER — Encounter (HOSPITAL_COMMUNITY): Payer: Self-pay | Admitting: Internal Medicine

## 2016-06-25 ENCOUNTER — Ambulatory Visit (HOSPITAL_COMMUNITY)
Admission: RE | Admit: 2016-06-25 | Discharge: 2016-06-25 | Disposition: A | Discharge status: Home / Self Care | Payer: Medicare Other | Source: Ambulatory Visit | Attending: Internal Medicine | Admitting: Internal Medicine

## 2016-06-25 VITALS — BP 126/82 | HR 70 | Resp 18 | Wt 242.0 lb

## 2016-06-25 DIAGNOSIS — Z79899 Other long term (current) drug therapy: Secondary | ICD-10-CM | POA: Diagnosis not present

## 2016-06-25 DIAGNOSIS — M1990 Unspecified osteoarthritis, unspecified site: Secondary | ICD-10-CM | POA: Insufficient documentation

## 2016-06-25 DIAGNOSIS — I509 Heart failure, unspecified: Secondary | ICD-10-CM | POA: Diagnosis not present

## 2016-06-25 DIAGNOSIS — F419 Anxiety disorder, unspecified: Secondary | ICD-10-CM | POA: Diagnosis not present

## 2016-06-25 DIAGNOSIS — E662 Morbid (severe) obesity with alveolar hypoventilation: Secondary | ICD-10-CM | POA: Diagnosis not present

## 2016-06-25 DIAGNOSIS — Z794 Long term (current) use of insulin: Secondary | ICD-10-CM | POA: Insufficient documentation

## 2016-06-25 DIAGNOSIS — Z885 Allergy status to narcotic agent status: Secondary | ICD-10-CM | POA: Diagnosis not present

## 2016-06-25 DIAGNOSIS — Z953 Presence of xenogenic heart valve: Secondary | ICD-10-CM | POA: Insufficient documentation

## 2016-06-25 DIAGNOSIS — Z888 Allergy status to other drugs, medicaments and biological substances status: Secondary | ICD-10-CM | POA: Diagnosis not present

## 2016-06-25 DIAGNOSIS — N184 Chronic kidney disease, stage 4 (severe): Secondary | ICD-10-CM | POA: Insufficient documentation

## 2016-06-25 DIAGNOSIS — E1122 Type 2 diabetes mellitus with diabetic chronic kidney disease: Secondary | ICD-10-CM | POA: Insufficient documentation

## 2016-06-25 DIAGNOSIS — Z6833 Body mass index (BMI) 33.0-33.9, adult: Secondary | ICD-10-CM | POA: Insufficient documentation

## 2016-06-25 DIAGNOSIS — I482 Chronic atrial fibrillation: Secondary | ICD-10-CM | POA: Insufficient documentation

## 2016-06-25 DIAGNOSIS — I252 Old myocardial infarction: Secondary | ICD-10-CM | POA: Diagnosis not present

## 2016-06-25 DIAGNOSIS — Z8249 Family history of ischemic heart disease and other diseases of the circulatory system: Secondary | ICD-10-CM | POA: Diagnosis not present

## 2016-06-25 DIAGNOSIS — I50810 Right heart failure, unspecified: Secondary | ICD-10-CM

## 2016-06-25 DIAGNOSIS — D8689 Sarcoidosis of other sites: Secondary | ICD-10-CM | POA: Insufficient documentation

## 2016-06-25 DIAGNOSIS — I5042 Chronic combined systolic (congestive) and diastolic (congestive) heart failure: Secondary | ICD-10-CM | POA: Diagnosis not present

## 2016-06-25 DIAGNOSIS — I5032 Chronic diastolic (congestive) heart failure: Secondary | ICD-10-CM | POA: Diagnosis not present

## 2016-06-25 DIAGNOSIS — Z91013 Allergy to seafood: Secondary | ICD-10-CM | POA: Diagnosis not present

## 2016-06-25 DIAGNOSIS — Z7901 Long term (current) use of anticoagulants: Secondary | ICD-10-CM | POA: Diagnosis not present

## 2016-06-25 DIAGNOSIS — I130 Hypertensive heart and chronic kidney disease with heart failure and stage 1 through stage 4 chronic kidney disease, or unspecified chronic kidney disease: Secondary | ICD-10-CM | POA: Insufficient documentation

## 2016-06-25 DIAGNOSIS — S81802A Unspecified open wound, left lower leg, initial encounter: Secondary | ICD-10-CM | POA: Diagnosis not present

## 2016-06-25 DIAGNOSIS — G4733 Obstructive sleep apnea (adult) (pediatric): Secondary | ICD-10-CM | POA: Insufficient documentation

## 2016-06-25 DIAGNOSIS — Z951 Presence of aortocoronary bypass graft: Secondary | ICD-10-CM | POA: Insufficient documentation

## 2016-06-25 DIAGNOSIS — Z91040 Latex allergy status: Secondary | ICD-10-CM | POA: Insufficient documentation

## 2016-06-25 DIAGNOSIS — M109 Gout, unspecified: Secondary | ICD-10-CM | POA: Diagnosis not present

## 2016-06-25 DIAGNOSIS — I2510 Atherosclerotic heart disease of native coronary artery without angina pectoris: Secondary | ICD-10-CM | POA: Insufficient documentation

## 2016-06-25 DIAGNOSIS — Z823 Family history of stroke: Secondary | ICD-10-CM | POA: Insufficient documentation

## 2016-06-25 DIAGNOSIS — R188 Other ascites: Secondary | ICD-10-CM | POA: Diagnosis not present

## 2016-06-25 DIAGNOSIS — Z950 Presence of cardiac pacemaker: Secondary | ICD-10-CM | POA: Insufficient documentation

## 2016-06-25 DIAGNOSIS — Z87891 Personal history of nicotine dependence: Secondary | ICD-10-CM | POA: Insufficient documentation

## 2016-06-25 LAB — COMPREHENSIVE METABOLIC PANEL
ALT: 12 U/L — AB (ref 17–63)
AST: 15 U/L (ref 15–41)
Albumin: 3.6 g/dL (ref 3.5–5.0)
Alkaline Phosphatase: 90 U/L (ref 38–126)
Anion gap: 10 (ref 5–15)
BILIRUBIN TOTAL: 1.8 mg/dL — AB (ref 0.3–1.2)
BUN: 69 mg/dL — AB (ref 6–20)
CHLORIDE: 98 mmol/L — AB (ref 101–111)
CO2: 24 mmol/L (ref 22–32)
CREATININE: 2.76 mg/dL — AB (ref 0.61–1.24)
Calcium: 10.1 mg/dL (ref 8.9–10.3)
GFR calc Af Amer: 24 mL/min — ABNORMAL LOW (ref >60)
GFR calc non Af Amer: 21 mL/min — ABNORMAL LOW (ref >60)
GLUCOSE: 159 mg/dL — AB (ref 65–99)
POTASSIUM: 4.2 mmol/L (ref 3.5–5.1)
Sodium: 132 mmol/L — ABNORMAL LOW (ref 135–145)
TOTAL PROTEIN: 6.8 g/dL (ref 6.5–8.1)

## 2016-06-25 MED ORDER — CEPHALEXIN 500 MG PO CAPS: 500.0000 mg | ORAL_CAPSULE | Freq: Three times a day (TID) | ORAL | Status: DC

## 2016-06-25 NOTE — Progress Notes (Signed)
CMET results faxed to Dr. Acie Fredrickson per patient request.  Renee Pain

## 2016-06-25 NOTE — Progress Notes (Signed)
Patient ID: [name] Fiddler, [name], male   DOB: 26-Feb-1941, 75 y.o.   MRN: [id number]    Advanced Heart Failure Clinic Note   Referring Physician: Dr. Acie Fredrickson PCP: Betty Martinique, [name] Cardiologist: Mertie Moores, [name] , previous [facility] patient  Nephrologist: Dr. Arty Baumgartner.  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   He reports fatigue and SOB walking down [employer]. He had to stop 3 times. Feels like this is relatively stable for his functional status over the past 2 years, with occasional ups and downs.  Starting picking up ascites in 2005-6 and has had problems on and off since then. Gets SOB after ~60 feet. Denies SOB after bathing or changing clothes, but does endorse fatigue. Denies CP. Has mild case of labyrinthitis last week with associated dizziness, but non otherwise.  Switched over to torsemide in 11/2015. Still pees well on it, wife thinks his urine is slightly darker. Doesn't ever take any extra. Drinks between 2-3 L daily.  Only limits sodium intake by not adding extra salt.  Wife cooks with "low sodium" food whenever possible. Does occasional feel palpitations. Denies orthopnea. Bendopnea difficult to characterize due to large abdomen.  Has discussed Paracentesis with Nephrology and held off with concern for fluid shift.  Was started on aldactone 12.5 mg daily by Dr. Acie Fredrickson last week.  Weight at home 230-235 lbs.  (238 this morning after breakfast which included 2 cups of coffee and a glass of milk).  He slipped in pool yesterday and has large abrasion down  left leg with significant, circumferential edema with pitting edema up into his thighs on the left. He is on xarelto, denies any other bleeding. 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  ICD interrogation - Pacer: AT/AF burden >99%, VP 74%. Two episodes of RVR.   Echo 09/07/15 LVEF 50-55%, RV mildly dilated, bioprosthetic AV and s/p tricuspid valve repair  Echo 12/04/15 LVEF 35-40%, mild MR, mild LAE, RV mildly dilated and moderately reduced, Mod RAE, PA peak pressure 39 mm Hg, bioprosthetic AV and s/p tricuspid valve repair  CT abdomen pelvis 12/06/15 with heterogenous hepatic steatosis and advanced atherosclerosis, status post median sternotomy.  Review of Systems: [y] = yes, _0  = no   General: Weight gain [y]; Weight loss _1 ; Anorexia _2 ; Fatigue _3 ; Fever _4 ; Chills _5 ; Weakness _6   Cardiac: Chest pain/pressure _7 ; Resting SOB _8 ; Exertional SOB [y]; Orthopnea _9 ; Pedal Edema _10 ; Palpitations _11 ; Syncope _12 ; Presyncope _13 ; Paroxysmal nocturnal dyspnea_14   Pulmonary: Cough _15 ; Wheezing_16 ; Hemoptysis_17 ; Sputum _18 ; Snoring _19   GI: Vomiting_20 ; Dysphagia_21 ; Melena_22 ; Hematochezia _23 ; Heartburn_24 ; Abdominal pain _25 ; Constipation _26 ; Diarrhea _27 ; BRBPR _28   GU: Hematuria_29 ; Dysuria _30 ; Nocturia_31   Vascular: Pain in legs with walking _32 ; Pain in feet with lying flat _33 ; Non-healing sores _34 ; Stroke _35 ; TIA _36 ; Slurred speech _37 ;  Neuro: Headaches_0 ; Vertigo_1 ; Seizures_2 ; Paresthesias_3 ;Blurred vision _4 ; Diplopia _5 ; Vision changes _6   Ortho/Skin: Arthritis [y]; Joint pain [y]; Muscle pain _7 ; Joint swelling _8 ; Back Pain _9 ; Rash _10   Psych: Depression_11 ; Anxiety_12   Heme: Bleeding problems _13 ; Clotting disorders _14 ; Anemia _15   Endocrine: Diabetes _16 ; Thyroid dysfunction_17    Past Medical History  Diagnosis Date  . Coronary heart disease     stent, CABG,  RBBB  . Valvular heart disease     aortic stenosis/regurgitation  . Sarcoid ([place]) 2011    pulmonary and bone marrow  . Hypercalcemia 2011    due to sarcoidiosis  . Bone marrow disease   . Diabetes mellitus   . Hemorrhagic cystitis 2012  . Gout   . HTN (hypertension)   . A-fib ([place])     permanent with tachy-brady syndrome  . Myocardial infarction [facility])     1995, 2002, 2006  . OSA (obstructive sleep apnea)     use bipap setting of 10 and 12  . Complication of anesthesia 11-16-13    required alot of versed per anesthesia with cataract surgery  . Chronic kidney disease (CKD), stage III (moderate)     lov note dr Koleen Nimrod nephrology 09-17-13 on chart  . Atrial flutter ([facility])     hx of  . CHF (congestive heart failure) ([place])   . Pneumonia 1966, 2011    hx of  . Anxiety   . Osteoarthritis   . DDD (degenerative disc disease)   . Synovial cyst of lumbar spine     l3-l4, l4-l5, injections  . Anemia   . Hepatitis     mono  . Cancer ([place])     basal cell  . Right sciatic nerve pain     for block thursday 01-21-2014, injections  . Fungal toenail infection   . Biceps tendon tear     right  . Diabetes ([place])   . Shortness of breath dyspnea   . Presence of permanent cardiac pacemaker     Current Outpatient Prescriptions  Medication Sig Dispense Refill  . acetaminophen (TYLENOL) 500 MG tablet Take 1,000 mg by mouth 2 (two) times daily as needed (pain).     [name] allopurinol (ZYLOPRIM) 100 MG tablet Take 200 mg by mouth daily with breakfast.     . Carnitine-B5-B6 500-15-5 MG TABS Take 1,500 mg by mouth daily.    . diazepam (VALIUM) 10 MG tablet Take 1 tablet by mouth daily as needed for anxiety or sleep. Ankle and foot cramps  0  . dicyclomine (BENTYL) 10 MG capsule Take 10 mg by mouth 2 (two) times daily.    . diphenhydrAMINE (BENADRYL) 25 MG tablet Take 25 mg by mouth every 6 (six) hours as needed for allergies.    . diphenoxylate-atropine (LOMOTIL) 2.5-0.025 MG per tablet Take 1-2  tablets by mouth daily as needed. stomach  1  . insulin glargine (LANTUS) 100 unit/mL SOPN Inject 2-3 Units into the skin daily after breakfast. CBG 120-140 2 units, 140-160 3 units    . metoprolol (LOPRESSOR) 50 MG tablet Take 1 tablet (50 mg total) by mouth 2 (two) times daily. 60 tablet 0  . metoprolol tartrate (LOPRESSOR) 25 MG tablet Take 1 tablet (25 mg total) by mouth 2 (two) times daily. 180 tablet 1  . nitroGLYCERIN (NITROGLYN) 2 % ointment Apply 1 inch topically 4 (four) times daily. 30 g 3  . spironolactone (  ALDACTONE) 25 MG tablet Take 0.5 tablets (12.5 mg total) by mouth daily. 45 tablet 3  . terazosin (HYTRIN) 5 MG capsule Take 1 capsule (5 mg total) by mouth at bedtime. 30 capsule 0  . Testosterone Cypionate 200 MG/ML KIT Inject 1 mL into the muscle every 14 (fourteen) days.    [name] torsemide (DEMADEX) 20 MG tablet Take 40 mg by mouth daily.    . Turmeric 500 MG TABS Take 1,000 mg by mouth at bedtime.    Alveda Reasons 15 MG TABS tablet TAKE 1 TABLET (15 MG TOTAL) BY MOUTH DAILY. 30 tablet 11  . zolpidem (AMBIEN) 10 MG tablet Take 10 mg by mouth at bedtime.      No current facility-administered medications for this encounter.    Allergies  Allergen Reactions  . Actos [Pioglitazone Hydrochloride] Swelling and Other (See Comments)    Severe peripheral edema  . Codeine Other (See Comments)    Makes Pt aggressive  . Depakote [Divalproex Sodium] Diarrhea    severe  . Diltiazem Other (See Comments)    lethargy and dyspnea  . Hydralazine Other (See Comments)    Severe chills and SOB   . Isosorbide Dinitrate Other (See Comments)    Severe chills and SOB   . Januvia [Sitagliptin] Diarrhea and Other (See Comments)    bradycardia  . Loop Diuretics Other (See Comments)    Spike in BUN and creatine levels  . Losartan Other (See Comments)    aphasia  . Nsaids Other (See Comments)    Renal problems  . Onglyza [Saxagliptin] Diarrhea and Other (See Comments)    bradycardia  . Orudis  [Ketoprofen] Other (See Comments)    Cannot take due to renal insufficiency  . Penicillins Swelling and Other (See Comments)    Serum sickness  . Potassium Iodide Itching and Rash    Severe entire body itching and rash  . Rozerem [Ramelteon] Diarrhea    Severe   . Amiodarone Other (See Comments)    diarrhea  . Latex Swelling  . Verapamil Other (See Comments)    Severe fatigue  . Benazepril Hcl Other (See Comments)    ? Possible lowers plateletes  . Indomethacin Other (See Comments)    Renal problems   . Minocycline Other (See Comments)    Makes dizzy and feel just lousy.  [name] Pentazocine Lactate Other (See Comments)    Unknown allergic reaction - pt and wife do not recall this  . Poison Ivy Extract [Extract Of Poison Ivy] Rash and Other (See Comments)    blisters  . Sertraline Hcl Other (See Comments)    Pt and wife do not recall this  . Shellfish Allergy Rash      Social History   Social History  . Marital Status: Married    Spouse Name: N/A  . Number of Children: 4  . Years of Education: N/A   Occupational History  . retired Administrator, [employer]     due to coronary disease in 2000  . 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 Smoking status: Former Smoker -- 28 years    Types: Pipe, Cigars    Quit date: 12/25/1995  . Smokeless tobacco: Never Used  . Alcohol Use: Yes     Comment: 3-5 beer or wine per week  . Drug Use: No  . Sexual Activity: Not on file   Other Topics Concern  . Not on file   Social History Narrative  Family History  Problem Relation Age of Onset  . Heart attack Father   . Stroke Father   . Allergies    . Asthma    . Heart disease    . Cancer    . Stroke Mother     Danley Danker Vitals:   06/25/16 1152  BP: 126/82  Pulse: 70  Resp: 18  Weight: 242 lb (109.77 kg)  SpO2: 96%    PHYSICAL EXAM: General:  Elderly appearing. No respiratory difficulty HEENT: normal Neck: supple. JVD to jaw with prominent  cv wave. Carotids 2+ bilat; no bruits. No lymphadenopathy or thyromegaly appreciated. Cor: PMI nondisplaced. Irregularly irregular. 2/6 MR 1-2/6 TR Lungs: CTAB, normal effort Abdomen: Obese, tight, non-tender, severe distention No hepatosplenomegaly. No bruits or masses. +BS Extremities: no cyanosis, clubbing, rash. Trace ankle edema R ankle. Left leg with 2+ edema to thigh and significant abrasion from just below knee to above ankle. + echhymosis, wrapped with ACE bandage. Neuro: alert & oriented x 3, cranial nerves grossly intact. moves all 4 extremities w/o difficulty. Affect pleasant.   ASSESSMENT & PLAN:  1. Chronic combined CHF : Echo 12/04/15 LVEF 35-40%, mild MR, mild LAE, RV mildly dilated and moderately reduced, Mod RAE, PA peak pressure 39 mm Hg, bioprosthetic AV and s/p tricuspid valve repair 2. CAD status post CABG 2006 --Cath in 2012 with all grafts patent but severe disease in native vessels in OM and LAD territories 3. Aortic stenosis and tricuspid regurgitation status post bioprosthetic AVR 2012 and tricuspid valve repair in 05/2011 4. Chronic atrial fibrillation - AT/AF burden > 99% by pacemaker interrogation. 5. CKD Stage IV  6. DM 2  7. Anxiety  8. Ascites 9. OHS/OSA - On BiPAP 10. Left leg wound  NYHA Class III. Agree with repeat Echo.  Shirley Friar, PA-C  Patient seen and examined with Oda Kilts, PA-C. We discussed all aspects of the encounter. I agree with the assessment and plan as stated above.   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 He currently has marked volume overload with probable large-volume ascites. This is complicated by advanced CKD which has limited attempts at diuresis. He has been followed closely by Dr. [name] in Renal who has advised him to avoid paracentesis due to risk of fluid shifts and making renal failure worse. He also had AF and has failed rhythm control strategy and had a pacemaker placed within  the past year. He has a high burden of RV pacing and I am worried this may also be contributing to his HF. I will discuss with EP to get their thoughts. Finally, he has a large wound on his left shin after an accident in the pool yesterday. This was evaluated in Clinic by Darrick Grinder, NP and he was started on Keflex.   With regard to his RHF, I think it will continue to be a major challenge to manage his fluid status and ultimately he will need to rely on dialysis which I do not think he will tolerate well and we may eventually have to consider PD. I spoke to Dr. [name] on the phone and for now we will start with an abdominal u/s and paracentesis. Will limit to 3L removal. Hopefully this will provide [name] some relief and also decompress the renal vein a bit and possibly allow further diuresis. He may well need 2 or 3 taps. Will continue current diuretic regimen for now including spironolactone. Will see him back in several weeks. He  may need RHC in the near future to better define his hemodynamics. Repeat echo has been ordered by [facility] as well. We briefly discussed a Cardiomems device and we can keep that in the back of our minds.   Total time spent 45 minutes. Over half that time spent discussing above.   [name], Daniel,[name] 7:13 PM

## 2016-06-25 NOTE — Patient Instructions (Signed)
You are scheduled for a Paracentesis on Thursday 06/28/16 at 2:30 PM, THIS WILL BE DONE [facility] [place], report to radiology at 2:15 pm Please.  DO NOT TAKE XARELTO ON WED 7/5  Start Keflex 500 mg TID for 7 days  You have been referred to [place] in 1 week  Your physician recommends that you schedule a follow-up appointment in: 3-4 weeks

## 2016-06-26 DIAGNOSIS — I50810 Right heart failure, unspecified: Secondary | ICD-10-CM | POA: Insufficient documentation

## 2016-06-26 DIAGNOSIS — S81802A Unspecified open wound, left lower leg, initial encounter: Secondary | ICD-10-CM | POA: Insufficient documentation

## 2016-06-28 ENCOUNTER — Encounter: Payer: Self-pay | Admitting: Cardiovascular Disease

## 2016-06-28 ENCOUNTER — Ambulatory Visit (HOSPITAL_COMMUNITY)
Admission: RE | Admit: 2016-06-28 | Discharge: 2016-06-28 | Disposition: A | Discharge status: Home / Self Care | Payer: Medicare Other | Source: Ambulatory Visit | Attending: Internal Medicine | Admitting: Internal Medicine

## 2016-06-28 DIAGNOSIS — R188 Other ascites: Secondary | ICD-10-CM | POA: Diagnosis not present

## 2016-06-28 NOTE — Procedures (Signed)
Ultrasound-guided therapeutic paracentesis performed yielding 3 liters of serosanguineous colored fluid. No immediate complications.  [name] E 06/28/2016 3:13 PM

## 2016-06-30 ENCOUNTER — Encounter (HOSPITAL_COMMUNITY): Payer: Self-pay | Admitting: *Deleted

## 2016-06-30 ENCOUNTER — Emergency Department (HOSPITAL_COMMUNITY)
Admission: EM | Admit: 2016-06-30 | Discharge: 2016-06-30 | Disposition: A | Discharge status: Home / Self Care | Payer: Medicare Other | Attending: Emergency Medicine | Admitting: Emergency Medicine

## 2016-06-30 DIAGNOSIS — Z85828 Personal history of other malignant neoplasm of skin: Secondary | ICD-10-CM | POA: Insufficient documentation

## 2016-06-30 DIAGNOSIS — E119 Type 2 diabetes mellitus without complications: Secondary | ICD-10-CM | POA: Diagnosis not present

## 2016-06-30 DIAGNOSIS — Z79899 Other long term (current) drug therapy: Secondary | ICD-10-CM | POA: Diagnosis not present

## 2016-06-30 DIAGNOSIS — I509 Heart failure, unspecified: Secondary | ICD-10-CM | POA: Insufficient documentation

## 2016-06-30 DIAGNOSIS — I2510 Atherosclerotic heart disease of native coronary artery without angina pectoris: Secondary | ICD-10-CM | POA: Diagnosis not present

## 2016-06-30 DIAGNOSIS — Z794 Long term (current) use of insulin: Secondary | ICD-10-CM | POA: Diagnosis not present

## 2016-06-30 DIAGNOSIS — N289 Disorder of kidney and ureter, unspecified: Secondary | ICD-10-CM | POA: Diagnosis not present

## 2016-06-30 DIAGNOSIS — I110 Hypertensive heart disease with heart failure: Secondary | ICD-10-CM | POA: Diagnosis not present

## 2016-06-30 DIAGNOSIS — R339 Retention of urine, unspecified: Secondary | ICD-10-CM

## 2016-06-30 DIAGNOSIS — K5900 Constipation, unspecified: Secondary | ICD-10-CM | POA: Diagnosis not present

## 2016-06-30 DIAGNOSIS — I252 Old myocardial infarction: Secondary | ICD-10-CM | POA: Diagnosis not present

## 2016-06-30 DIAGNOSIS — I4891 Unspecified atrial fibrillation: Secondary | ICD-10-CM | POA: Diagnosis not present

## 2016-06-30 DIAGNOSIS — Z87891 Personal history of nicotine dependence: Secondary | ICD-10-CM | POA: Diagnosis not present

## 2016-06-30 LAB — URINALYSIS, ROUTINE W REFLEX MICROSCOPIC
Bilirubin Urine: NEGATIVE
GLUCOSE, UA: NEGATIVE mg/dL
HGB URINE DIPSTICK: NEGATIVE
KETONES UR: NEGATIVE mg/dL
Leukocytes, UA: NEGATIVE
Nitrite: NEGATIVE
PROTEIN: NEGATIVE mg/dL
Specific Gravity, Urine: 1.011 (ref 1.005–1.030)
pH: 5.5 (ref 5.0–8.0)

## 2016-06-30 LAB — COMPREHENSIVE METABOLIC PANEL
ALBUMIN: 4.1 g/dL (ref 3.5–5.0)
ALK PHOS: 112 U/L (ref 38–126)
ALT: 12 U/L — AB (ref 17–63)
ANION GAP: 14 (ref 5–15)
AST: 18 U/L (ref 15–41)
BUN: 81 mg/dL — AB (ref 6–20)
CALCIUM: 9.6 mg/dL (ref 8.9–10.3)
CO2: 23 mmol/L (ref 22–32)
Chloride: 96 mmol/L — ABNORMAL LOW (ref 101–111)
Creatinine, Ser: 3.43 mg/dL — ABNORMAL HIGH (ref 0.61–1.24)
GFR calc non Af Amer: 16 mL/min — ABNORMAL LOW (ref >60)
GFR, EST AFRICAN AMERICAN: 19 mL/min — AB (ref >60)
GLUCOSE: 172 mg/dL — AB (ref 65–99)
Potassium: 4 mmol/L (ref 3.5–5.1)
SODIUM: 133 mmol/L — AB (ref 135–145)
TOTAL PROTEIN: 6.9 g/dL (ref 6.5–8.1)
Total Bilirubin: 2.4 mg/dL — ABNORMAL HIGH (ref 0.3–1.2)

## 2016-06-30 LAB — CBC WITH DIFFERENTIAL/PLATELET
BASOS ABS: 0 10*3/uL (ref 0.0–0.1)
BASOS PCT: 0 %
EOS ABS: 0.1 10*3/uL (ref 0.0–0.7)
Eosinophils Relative: 1 %
HEMATOCRIT: 39.8 % (ref 39.0–52.0)
Hemoglobin: 13.6 g/dL (ref 13.0–17.0)
LYMPHS ABS: 0.5 10*3/uL — AB (ref 0.7–4.0)
Lymphocytes Relative: 9 %
MCH: 28.6 pg (ref 26.0–34.0)
MCHC: 34.2 g/dL (ref 30.0–36.0)
MCV: 83.6 fL (ref 78.0–100.0)
Monocytes Absolute: 0.2 10*3/uL (ref 0.1–1.0)
Monocytes Relative: 4 %
NEUTROS ABS: 5.3 10*3/uL (ref 1.7–7.7)
Neutrophils Relative %: 86 %
Platelets: 77 10*3/uL — ABNORMAL LOW (ref 150–400)
RBC: 4.76 MIL/uL (ref 4.22–5.81)
RDW: 19.2 % — AB (ref 11.5–15.5)
WBC: 6.1 10*3/uL (ref 4.0–10.5)

## 2016-06-30 MED ORDER — ACETAMINOPHEN 500 MG PO TABS
500.0000 mg | ORAL_TABLET | Freq: Once | ORAL | Status: AC
  Administered 2016-06-30: 500 mg via ORAL
  Filled 2016-06-30: qty 1

## 2016-06-30 NOTE — Discharge Instructions (Signed)
Acute Urinary Retention, Male Acute urinary retention is the temporary inability to urinate. This is a common problem in older men. As men age their prostates become larger and block the flow of urine from the bladder. This is usually a problem that has come on gradually.  HOME CARE INSTRUCTIONS If you are sent home with a Foley catheter and a drainage system, you will need to discuss the best course of action with your health care provider. While the catheter is in, maintain a good intake of fluids. Keep the drainage bag emptied and lower than your catheter. This is so that contaminated urine will not flow back into your bladder, which could lead to a urinary tract infection. There are two main types of drainage bags. One is a large bag that usually is used at night. It has a good capacity that will allow you to sleep through the night without having to empty it. The second type is called a leg bag. It has a smaller capacity, so it needs to be emptied more frequently. However, the main advantage is that it can be attached by a leg strap and can go underneath your clothing, allowing you the freedom to move about or leave your home. Only take over-the-counter or prescription medicines for pain, discomfort, or fever as directed by your health care provider.  SEEK MEDICAL CARE IF:  You develop a low-grade fever.  You experience spasms or leakage of urine with the spasms. SEEK IMMEDIATE MEDICAL CARE IF:   You develop chills or fever.  Your catheter stops draining urine.  Your catheter falls out.  You start to develop increased bleeding that does not respond to rest and increased fluid intake. MAKE SURE YOU:  Understand these instructions.  Will watch your condition.  Will get help right away if you are not doing well or get worse.   This information is not intended to replace advice given to you by your health care provider. Make sure you discuss any questions you have with your health care  provider.   Document Released: 03/18/2001 Document Revised: 04/26/2015 Document Reviewed: 05/21/2013 Elsevier Interactive Patient Education 2016 [employer].  Constipation, Adult Constipation is when a person has fewer than three bowel movements a week, has difficulty having a bowel movement, or has stools that are dry, hard, or larger than normal. As people grow older, constipation is more common. A low-fiber diet, not taking in enough fluids, and taking certain medicines may make constipation worse.  CAUSES   Certain medicines, such as antidepressants, pain medicine, iron supplements, antacids, and water pills.   Certain diseases, such as diabetes, irritable bowel syndrome (IBS), thyroid disease, or depression.   Not drinking enough water.   Not eating enough fiber-rich foods.   Stress or travel.   Lack of physical activity or exercise.   Ignoring the urge to have a bowel movement.   Using laxatives too much.  SIGNS AND SYMPTOMS   Having fewer than three bowel movements a week.   Straining to have a bowel movement.   Having stools that are hard, dry, or larger than normal.   Feeling full or bloated.   Pain in the lower abdomen.   Not feeling relief after having a bowel movement.  DIAGNOSIS  Your health care provider will take a medical history and perform a physical exam. Further testing may be done for severe constipation. Some tests may include:  A barium enema X-ray to examine your rectum, colon, and, sometimes, your small intestine.  A sigmoidoscopy to examine your lower colon.   A colonoscopy to examine your entire colon. TREATMENT  Treatment will depend on the severity of your constipation and what is causing it. Some dietary treatments include drinking more fluids and eating more fiber-rich foods. Lifestyle treatments may include regular exercise. If these diet and lifestyle recommendations do not help, your health care provider may recommend  taking over-the-counter laxative medicines to help you have bowel movements. Prescription medicines may be prescribed if over-the-counter medicines do not work.  HOME CARE INSTRUCTIONS   Eat foods that have a lot of fiber, such as fruits, vegetables, whole grains, and beans.  Limit foods high in fat and processed sugars, such as french fries, hamburgers, cookies, candies, and soda.   A fiber supplement may be added to your diet if you cannot get enough fiber from foods.   Drink enough fluids to keep your urine clear or pale yellow.   Exercise regularly or as directed by your health care provider.   Go to the restroom when you have the urge to go. Do not hold it.   Only take over-the-counter or prescription medicines as directed by your health care provider. Do not take other medicines for constipation without talking to your health care provider first.  [place] IF:   You have bright red blood in your stool.   Your constipation lasts for more than 4 days or gets worse.   You have abdominal or rectal pain.   You have thin, pencil-like stools.   You have unexplained weight loss. MAKE SURE YOU:   Understand these instructions.  Will watch your condition.  Will get help right away if you are not doing well or get worse.   This information is not intended to replace advice given to you by your health care provider. Make sure you discuss any questions you have with your health care provider.   Document Released: 09/07/2004 Document Revised: 12/31/2014 Document Reviewed: 09/21/2013 Elsevier Interactive Patient Education [employer].

## 2016-06-30 NOTE — ED Notes (Signed)
Pt presents from UC with urinary retention, last time of emptying bladder was day before yesterday according to pt,

## 2016-06-30 NOTE — ED Provider Notes (Signed)
CSN: [id number]     Arrival date & time 06/30/16  1420 History   First MD Initiated Contact with Patient 06/30/16 1442     Chief Complaint  Patient presents with  . Urinary Retention     HPI Patient presents with urinary retention. He has a history of BPH and heart failure with ascites. He has had recent scrape on his leg has been on pain medicines for it. States he is constipated and obstruction. States that his sputum and urine. Last urinated around 2 days ago. Has some baseline renal insufficiency. Get seen in the heart failure clinic and had a paracentesis 5 days ago.   Past Medical History  Diagnosis Date  . Coronary heart disease     stent, CABG, RBBB  . Valvular heart disease     aortic stenosis/regurgitation  . 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 Hypercalcemia 2011    due to sarcoidiosis  . Bone marrow disease   . Diabetes mellitus   . Hemorrhagic cystitis 2012  . Gout   . HTN (hypertension)   . A-fib ([place])     permanent with tachy-brady syndrome  . Myocardial infarction [facility])     1995, 2002, 2006  . OSA (obstructive sleep apnea)     use bipap setting of 10 and 12  . Complication of anesthesia 11-16-13    required alot of versed per anesthesia with cataract surgery  . Chronic kidney disease (CKD), stage III (moderate)     lov note dr Koleen Nimrod nephrology 09-17-13 on chart  . Atrial flutter (HCC)     hx of  . CHF (congestive heart failure) ([place])   . Pneumonia 1966, 2011    hx of  . Anxiety   . Osteoarthritis   . DDD (degenerative disc disease)   . Synovial cyst of lumbar spine     l3-l4, l4-l5, injections  . Anemia   . Hepatitis     mono  . Cancer ([place])     basal cell  . Right sciatic nerve pain     for block thursday 01-21-2014, injections  . Fungal toenail infection   . Biceps tendon tear     right  . Diabetes ([place])   . Shortness of breath dyspnea   . Presence of permanent cardiac pacemaker    Past Surgical History  Procedure  Laterality Date  . Cataract surgery Right 2007  . Transurethral resection of prostate  oct. 2012    TURP  . Vasectomy  1977  . Redo median sternotomy, extracorporeal cirulation, avr, tricuspid valve repair  06/13/2011    AVR(23-mm Edwards pericardial Magna-Ease valve./ TVrepair (34-mm Edwards MC3 annuloplasty ring  . Coronary artery bypass graft  2006    x 5  . Cataract surgery Left 11-16-13  . Cardioversion N/A 12/30/2013    Procedure: CARDIOVERSION;  Surgeon: [name], MD;  Location: [facility] ENDOSCOPY;  Service: Cardiovascular;  Laterality: N/A;  10:49 cardioversion at 120 joules, then 150 joules, to SB  used  Lido 44m,  Propofol 160 mcg  . Colonoscopy N/A 01/29/2014    Procedure: COLONOSCOPY;  Surgeon: JWinfield Cunas, MD;  [facility];  Service: Endoscopy;  Laterality: N/A;  amanda//ja  . Cardioversion  2003, 2006, 2012, 2013  . Portacath placement    . Portacath removed    . Basal cell carcinoma excision Right 2015    ear  . Total knee arthroplasty Right 05/24/2014    Procedure: RIGHT TOTAL KNEE  ARTHROPLASTY;  Surgeon: Gearlean Alf, MD;  Location: [facility] ORS;  Service: Orthopedics;  Laterality: Right;  . Ep implantable device N/A 05/05/2015    Procedure: Pacemaker Implant;  Surgeon: Thompson Grayer, MD;  Location: [place] CV LAB;  Service: Cardiovascular;  Laterality: N/A;  . Insert / replace / remove pacemaker  05/05/2015  . Flexible sigmoidoscopy N/A 12/07/2015    Procedure: FLEXIBLE SIGMOIDOSCOPY;  Surgeon: Wilford Corner, MD;  Location: [facility] ENDOSCOPY;  Service: Endoscopy;  Laterality: N/A;  Unprepped   Family History  Problem Relation Age of Onset  . Heart attack Father   . Stroke Father   . Allergies    . Asthma    . Heart disease    . Cancer    . Stroke Mother    Social History  Substance Use Topics  . Smoking status: Former Smoker -- 28 years    Types: Pipe, Cigars    Quit date: 12/25/1995  . Smokeless tobacco: Never Used  . Alcohol Use: Yes      Comment: 3-5 beer or wine per week    Review of Systems  Constitutional: Negative for appetite change.  Respiratory: Negative for shortness of breath.   Cardiovascular: Negative for chest pain.  Gastrointestinal: Positive for abdominal pain.  Genitourinary: Positive for decreased urine volume. Negative for scrotal swelling.  Skin: Positive for wound.  Neurological: Negative for headaches.      Allergies  Actos; Codeine; Depakote; Diltiazem; Hydralazine; Isosorbide dinitrate; Januvia; Loop diuretics; Losartan; Nsaids; Onglyza; Orudis; Penicillins; Potassium iodide; Rozerem; Amiodarone; Latex; Verapamil; Benazepril hcl; Indomethacin; Minocycline; Pentazocine lactate; Poison ivy extract; Sertraline hcl; and Shellfish allergy  Home Medications   Prior to Admission medications   Medication Sig Start Date End Date Taking? Authorizing Provider  acetaminophen (TYLENOL) 500 MG tablet Take 1,000 mg by mouth 2 (two) times daily as needed (pain).    Yes Historical Provider, MD  allopurinol (ZYLOPRIM) 100 MG tablet Take 200 mg by mouth daily with breakfast.  08/06/11  Yes [name], MD  Carnitine-B5-B6 500-15-5 MG TABS Take 500-1,000 mg by mouth 2 (two) times daily. Take 2 tablets in the morning and 1 tablet in the evening   Yes Historical Provider, MD  cephALEXin (KEFLEX) 500 MG capsule Take 1 capsule (500 mg total) by mouth 3 (three) times daily. 06/25/16  Yes Jolaine Artist, MD  diazepam (VALIUM) 10 MG tablet Take 1 tablet by mouth daily as needed (ankle and leg cramps).  04/29/15  Yes Historical Provider, MD  dicyclomine (BENTYL) 10 MG capsule Take 10 mg by mouth 2 (two) times daily.   Yes Historical Provider, MD  diphenhydrAMINE (BENADRYL) 25 MG tablet Take 50 mg by mouth every 6 (six) hours as needed for allergies.    Yes Historical Provider, MD  diphenoxylate-atropine (LOMOTIL) 2.5-0.025 MG per tablet Take 2 tablets by mouth 2 (two) times daily as needed for diarrhea or loose stools.  stomach 04/06/15  Yes Historical Provider, MD  HYDROmorphone (DILAUDID) 2 MG tablet Take 2-4 mg by mouth daily as needed for severe pain.  06/27/16  Yes Historical Provider, MD  insulin glargine (LANTUS) 100 unit/mL SOPN Inject 2-6 Units into the skin daily after breakfast. CBG 120-140 2 units, 141-160 3 units, 161-180 4 units, 181-200 5 units, 201-220 6 units   Yes Historical Provider, MD  metoprolol (LOPRESSOR) 50 MG tablet Take 1 tablet (50 mg total) by mouth 2 (two) times daily. 05/30/16  Yes Thayer Headings, MD  metoprolol tartrate (LOPRESSOR) 25 MG tablet  Take 1 tablet (25 mg total) by mouth 2 (two) times daily. 06/07/16  Yes Thayer Headings, MD  nitroGLYCERIN (NITROGLYN) 2 % ointment Apply 1 inch topically 4 (four) times daily. 03/22/16  Yes Thayer Headings, MD  spironolactone (ALDACTONE) 25 MG tablet Take 0.5 tablets (12.5 mg total) by mouth daily. 06/21/16  Yes Thayer Headings, MD  terazosin (HYTRIN) 5 MG capsule Take 1 capsule (5 mg total) by mouth at bedtime. Patient taking differently: Take 5 mg by mouth every morning.  01/19/16  Yes [name], MD  Testosterone Cypionate 200 MG/ML KIT Inject 1 mL into the muscle every 14 (fourteen) days.   Yes Historical Provider, MD  torsemide (DEMADEX) 20 MG tablet Take 40 mg by mouth daily.   Yes Historical Provider, MD  traMADol (ULTRAM) 50 MG tablet Take 50-100 mg by mouth daily as needed for moderate pain.  03/29/16  Yes Historical Provider, MD  Turmeric 500 MG TABS Take 1,000 mg by mouth at bedtime.   Yes Historical Provider, MD  XARELTO 15 MG TABS tablet TAKE 1 TABLET (15 MG TOTAL) BY MOUTH DAILY. 02/09/16  Yes [name], MD  zolpidem (AMBIEN) 10 MG tablet Take 10 mg by mouth at bedtime.    Yes Historical Provider, MD   BP 138/75 mmHg  Pulse 60  Temp(Src) 97.7 F (36.5 C) (Oral)  Resp 17  SpO2 96% Physical Exam  Constitutional: He appears well-developed.  Patient appears uncomfortable  HENT:  Head: Atraumatic.  Neck: Neck supple.   Cardiovascular:  Bradycardia  Pulmonary/Chest: Effort normal.  Abdominal: He exhibits distension. There is tenderness.  Tenderness and mass over lower abdomen.  Musculoskeletal: He exhibits no edema.  Neurological: He is alert.  Skin: Skin is warm.    ED Course  Procedures (including critical care time) Labs Review Labs Reviewed  CBC WITH DIFFERENTIAL/PLATELET - Abnormal; Notable for the following:    RDW 19.2 (*)    Platelets 77 (*)    Lymphs Abs 0.5 (*)    All other components within normal limits  COMPREHENSIVE METABOLIC PANEL - Abnormal; Notable for the following:    Sodium 133 (*)    Chloride 96 (*)    Glucose, Bld 172 (*)    BUN 81 (*)    Creatinine, Ser 3.43 (*)    ALT 12 (*)    Total Bilirubin 2.4 (*)    GFR calc non Af Amer 16 (*)    GFR calc Af Amer 19 (*)    All other components within normal limits  URINALYSIS, ROUTINE W REFLEX MICROSCOPIC (NOT AT [facility])    Imaging Review No results found. I have personally reviewed and evaluated these images and lab results as part of my medical decision-making.   EKG Interpretation None      MDM   Final diagnoses:  Urinary retention  Renal insufficiency  Constipation, unspecified constipation type    Patient with urinary retention. Feels much better after Foley catheter. Creatinine has increased. Will follow-up with his urologist, Dr. [name]. Had some constipation also. Rectal exam done and showed stool in the vault but not frank fecal impaction. Some stool broken up patient was discharged home.    Davonna Belling, MD 06/30/16 [phone]

## 2016-07-02 ENCOUNTER — Other Ambulatory Visit (HOSPITAL_COMMUNITY): Payer: Medicare Other

## 2016-07-03 ENCOUNTER — Telehealth (HOSPITAL_COMMUNITY): Payer: Self-pay

## 2016-07-03 DIAGNOSIS — I5022 Chronic systolic (congestive) heart failure: Secondary | ICD-10-CM

## 2016-07-03 NOTE — Telephone Encounter (Signed)
Calling CHF clinic triage line to reschedule lab. Will have patient come next available lab day (Friday) for BMET per Dr. Haroldine Laws.  Do the following things EVERYDAY: 1) Weigh yourself in the morning before breakfast. Write it down and keep it in a log. 2) Take your medicines as prescribed 3) Eat low salt foods-Limit salt (sodium) to 2000 mg per day.  4) Stay as active as you can everyday 5) Limit all fluids for the day to less than 2 liters

## 2016-07-04 ENCOUNTER — Other Ambulatory Visit: Payer: Self-pay

## 2016-07-04 MED ORDER — METOPROLOL TARTRATE 50 MG PO TABS: 50.0000 mg | ORAL_TABLET | Freq: Two times a day (BID) | ORAL | Status: DC

## 2016-07-04 MED ORDER — METOPROLOL TARTRATE 25 MG PO TABS: 25.0000 mg | ORAL_TABLET | Freq: Two times a day (BID) | ORAL | Status: DC

## 2016-07-06 ENCOUNTER — Ambulatory Visit (HOSPITAL_COMMUNITY)
Admission: RE | Admit: 2016-07-06 | Discharge: 2016-07-06 | Disposition: A | Discharge status: Home / Self Care | Payer: Medicare Other | Source: Ambulatory Visit | Attending: Cardiology | Admitting: Cardiology

## 2016-07-06 DIAGNOSIS — I5022 Chronic systolic (congestive) heart failure: Secondary | ICD-10-CM | POA: Diagnosis not present

## 2016-07-06 LAB — BASIC METABOLIC PANEL
Anion gap: 9 (ref 5–15)
BUN: 63 mg/dL — ABNORMAL HIGH (ref 6–20)
CALCIUM: 9.5 mg/dL (ref 8.9–10.3)
CO2: 25 mmol/L (ref 22–32)
CREATININE: 2.53 mg/dL — AB (ref 0.61–1.24)
Chloride: 98 mmol/L — ABNORMAL LOW (ref 101–111)
GFR calc non Af Amer: 23 mL/min — ABNORMAL LOW (ref >60)
GFR, EST AFRICAN AMERICAN: 27 mL/min — AB (ref >60)
Glucose, Bld: 194 mg/dL — ABNORMAL HIGH (ref 65–99)
Potassium: 4.2 mmol/L (ref 3.5–5.1)
SODIUM: 132 mmol/L — AB (ref 135–145)

## 2016-07-10 ENCOUNTER — Other Ambulatory Visit: Payer: Self-pay

## 2016-07-10 ENCOUNTER — Ambulatory Visit (HOSPITAL_COMMUNITY): Payer: Medicare Other | Attending: Internal Medicine

## 2016-07-10 ENCOUNTER — Encounter: Payer: Self-pay | Admitting: Internal Medicine

## 2016-07-10 DIAGNOSIS — I5042 Chronic combined systolic (congestive) and diastolic (congestive) heart failure: Secondary | ICD-10-CM | POA: Insufficient documentation

## 2016-07-10 DIAGNOSIS — E1122 Type 2 diabetes mellitus with diabetic chronic kidney disease: Secondary | ICD-10-CM | POA: Diagnosis not present

## 2016-07-10 DIAGNOSIS — I340 Nonrheumatic mitral (valve) insufficiency: Secondary | ICD-10-CM | POA: Insufficient documentation

## 2016-07-10 DIAGNOSIS — Z953 Presence of xenogenic heart valve: Secondary | ICD-10-CM | POA: Diagnosis not present

## 2016-07-10 DIAGNOSIS — R188 Other ascites: Secondary | ICD-10-CM | POA: Diagnosis not present

## 2016-07-10 DIAGNOSIS — G4733 Obstructive sleep apnea (adult) (pediatric): Secondary | ICD-10-CM | POA: Insufficient documentation

## 2016-07-10 DIAGNOSIS — I071 Rheumatic tricuspid insufficiency: Secondary | ICD-10-CM | POA: Insufficient documentation

## 2016-07-10 DIAGNOSIS — I509 Heart failure, unspecified: Secondary | ICD-10-CM | POA: Diagnosis present

## 2016-07-10 DIAGNOSIS — N189 Chronic kidney disease, unspecified: Secondary | ICD-10-CM | POA: Diagnosis not present

## 2016-07-10 DIAGNOSIS — I130 Hypertensive heart and chronic kidney disease with heart failure and stage 1 through stage 4 chronic kidney disease, or unspecified chronic kidney disease: Secondary | ICD-10-CM | POA: Diagnosis not present

## 2016-07-11 ENCOUNTER — Encounter (HOSPITAL_BASED_OUTPATIENT_CLINIC_OR_DEPARTMENT_OTHER): Payer: Medicare Other | Attending: Surgery

## 2016-07-11 DIAGNOSIS — M70862 Other soft tissue disorders related to use, overuse and pressure, left lower leg: Secondary | ICD-10-CM | POA: Diagnosis not present

## 2016-07-11 DIAGNOSIS — M109 Gout, unspecified: Secondary | ICD-10-CM | POA: Insufficient documentation

## 2016-07-11 DIAGNOSIS — I482 Chronic atrial fibrillation: Secondary | ICD-10-CM | POA: Insufficient documentation

## 2016-07-11 DIAGNOSIS — N183 Chronic kidney disease, stage 3 (moderate): Secondary | ICD-10-CM | POA: Insufficient documentation

## 2016-07-11 DIAGNOSIS — G4730 Sleep apnea, unspecified: Secondary | ICD-10-CM | POA: Diagnosis not present

## 2016-07-11 DIAGNOSIS — I5043 Acute on chronic combined systolic (congestive) and diastolic (congestive) heart failure: Secondary | ICD-10-CM | POA: Insufficient documentation

## 2016-07-11 DIAGNOSIS — Z85828 Personal history of other malignant neoplasm of skin: Secondary | ICD-10-CM | POA: Insufficient documentation

## 2016-07-11 DIAGNOSIS — E1122 Type 2 diabetes mellitus with diabetic chronic kidney disease: Secondary | ICD-10-CM | POA: Insufficient documentation

## 2016-07-11 DIAGNOSIS — Z7901 Long term (current) use of anticoagulants: Secondary | ICD-10-CM | POA: Insufficient documentation

## 2016-07-11 DIAGNOSIS — I130 Hypertensive heart and chronic kidney disease with heart failure and stage 1 through stage 4 chronic kidney disease, or unspecified chronic kidney disease: Secondary | ICD-10-CM | POA: Diagnosis not present

## 2016-07-11 DIAGNOSIS — I2510 Atherosclerotic heart disease of native coronary artery without angina pectoris: Secondary | ICD-10-CM | POA: Insufficient documentation

## 2016-07-11 DIAGNOSIS — E11622 Type 2 diabetes mellitus with other skin ulcer: Secondary | ICD-10-CM | POA: Insufficient documentation

## 2016-07-11 DIAGNOSIS — L97822 Non-pressure chronic ulcer of other part of left lower leg with fat layer exposed: Secondary | ICD-10-CM | POA: Insufficient documentation

## 2016-07-13 ENCOUNTER — Telehealth: Payer: Self-pay | Admitting: Nurse Practitioner

## 2016-07-13 NOTE — Telephone Encounter (Signed)
I called patient's home to report echo results and spoke first with his wife.  She asked about lab results done last Friday in CHF clinic and states she has left messages with no return call.  I reported the results to the patient and his wife.  He states he stopped aldactone on 7/12 due to concern that K+ level would be elevated.  He asked my thoughts on resuming the aldactone and I advised him that he should resume based on last office visit with Dr. Haroldine Laws and since K+ is well within normal limits.  He has a follow-up appointment Monday with Dr. Haroldine Laws.  Patient verbalized understanding and agreement and thanked me for the call.

## 2016-07-16 ENCOUNTER — Ambulatory Visit (HOSPITAL_COMMUNITY)
Admission: RE | Admit: 2016-07-16 | Discharge: 2016-07-16 | Disposition: A | Discharge status: Home / Self Care | Payer: Medicare Other | Source: Ambulatory Visit | Attending: Internal Medicine | Admitting: Internal Medicine

## 2016-07-16 ENCOUNTER — Encounter (HOSPITAL_COMMUNITY): Payer: Self-pay | Admitting: Internal Medicine

## 2016-07-16 VITALS — BP 142/76 | HR 66 | Wt 220.2 lb

## 2016-07-16 DIAGNOSIS — I519 Heart disease, unspecified: Secondary | ICD-10-CM

## 2016-07-16 DIAGNOSIS — N184 Chronic kidney disease, stage 4 (severe): Secondary | ICD-10-CM | POA: Insufficient documentation

## 2016-07-16 DIAGNOSIS — N183 Chronic kidney disease, stage 3 unspecified: Secondary | ICD-10-CM

## 2016-07-16 DIAGNOSIS — I2510 Atherosclerotic heart disease of native coronary artery without angina pectoris: Secondary | ICD-10-CM | POA: Insufficient documentation

## 2016-07-16 DIAGNOSIS — R188 Other ascites: Secondary | ICD-10-CM | POA: Diagnosis not present

## 2016-07-16 DIAGNOSIS — I252 Old myocardial infarction: Secondary | ICD-10-CM | POA: Insufficient documentation

## 2016-07-16 DIAGNOSIS — Z794 Long term (current) use of insulin: Secondary | ICD-10-CM | POA: Insufficient documentation

## 2016-07-16 DIAGNOSIS — E1122 Type 2 diabetes mellitus with diabetic chronic kidney disease: Secondary | ICD-10-CM | POA: Diagnosis not present

## 2016-07-16 DIAGNOSIS — Z953 Presence of xenogenic heart valve: Secondary | ICD-10-CM | POA: Insufficient documentation

## 2016-07-16 DIAGNOSIS — Z79899 Other long term (current) drug therapy: Secondary | ICD-10-CM | POA: Insufficient documentation

## 2016-07-16 DIAGNOSIS — Z885 Allergy status to narcotic agent status: Secondary | ICD-10-CM | POA: Insufficient documentation

## 2016-07-16 DIAGNOSIS — Z888 Allergy status to other drugs, medicaments and biological substances status: Secondary | ICD-10-CM | POA: Diagnosis not present

## 2016-07-16 DIAGNOSIS — I130 Hypertensive heart and chronic kidney disease with heart failure and stage 1 through stage 4 chronic kidney disease, or unspecified chronic kidney disease: Secondary | ICD-10-CM | POA: Insufficient documentation

## 2016-07-16 DIAGNOSIS — Z87891 Personal history of nicotine dependence: Secondary | ICD-10-CM | POA: Diagnosis not present

## 2016-07-16 DIAGNOSIS — I482 Chronic atrial fibrillation, unspecified: Secondary | ICD-10-CM

## 2016-07-16 DIAGNOSIS — D869 Sarcoidosis, unspecified: Secondary | ICD-10-CM | POA: Diagnosis not present

## 2016-07-16 DIAGNOSIS — Z823 Family history of stroke: Secondary | ICD-10-CM | POA: Insufficient documentation

## 2016-07-16 DIAGNOSIS — I5032 Chronic diastolic (congestive) heart failure: Secondary | ICD-10-CM | POA: Diagnosis not present

## 2016-07-16 DIAGNOSIS — I5042 Chronic combined systolic (congestive) and diastolic (congestive) heart failure: Secondary | ICD-10-CM | POA: Diagnosis not present

## 2016-07-16 DIAGNOSIS — Z7901 Long term (current) use of anticoagulants: Secondary | ICD-10-CM | POA: Insufficient documentation

## 2016-07-16 DIAGNOSIS — Z950 Presence of cardiac pacemaker: Secondary | ICD-10-CM | POA: Diagnosis not present

## 2016-07-16 DIAGNOSIS — E662 Morbid (severe) obesity with alveolar hypoventilation: Secondary | ICD-10-CM | POA: Insufficient documentation

## 2016-07-16 DIAGNOSIS — Z91040 Latex allergy status: Secondary | ICD-10-CM | POA: Insufficient documentation

## 2016-07-16 DIAGNOSIS — I5189 Other ill-defined heart diseases: Secondary | ICD-10-CM

## 2016-07-16 DIAGNOSIS — F419 Anxiety disorder, unspecified: Secondary | ICD-10-CM | POA: Diagnosis not present

## 2016-07-16 DIAGNOSIS — Z880 Allergy status to penicillin: Secondary | ICD-10-CM | POA: Diagnosis not present

## 2016-07-16 DIAGNOSIS — Z951 Presence of aortocoronary bypass graft: Secondary | ICD-10-CM | POA: Diagnosis not present

## 2016-07-16 DIAGNOSIS — Z8249 Family history of ischemic heart disease and other diseases of the circulatory system: Secondary | ICD-10-CM | POA: Insufficient documentation

## 2016-07-16 DIAGNOSIS — Z6830 Body mass index (BMI) 30.0-30.9, adult: Secondary | ICD-10-CM | POA: Insufficient documentation

## 2016-07-16 LAB — BASIC METABOLIC PANEL
ANION GAP: 10 (ref 5–15)
BUN: 73 mg/dL — ABNORMAL HIGH (ref 6–20)
CO2: 24 mmol/L (ref 22–32)
Calcium: 9.2 mg/dL (ref 8.9–10.3)
Chloride: 100 mmol/L — ABNORMAL LOW (ref 101–111)
Creatinine, Ser: 2.51 mg/dL — ABNORMAL HIGH (ref 0.61–1.24)
GFR calc Af Amer: 27 mL/min — ABNORMAL LOW (ref >60)
GFR calc non Af Amer: 23 mL/min — ABNORMAL LOW (ref >60)
GLUCOSE: 202 mg/dL — AB (ref 65–99)
POTASSIUM: 4.3 mmol/L (ref 3.5–5.1)
Sodium: 134 mmol/L — ABNORMAL LOW (ref 135–145)

## 2016-07-16 MED ORDER — SPIRONOLACTONE 25 MG PO TABS: 25.0000 mg | ORAL_TABLET | Freq: Every day | ORAL | 3 refills | Status: DC

## 2016-07-16 NOTE — Progress Notes (Signed)
Patient ID: [name], [name], male   DOB: 1941/10/10, 75 y.o.   MRN: [id number]    Advanced Heart Failure Clinic Note   Referring Physician: Dr. Acie [name] PCP: [name] Martinique, [name] Cardiologist: [name] Moores, [name] , previous [place] patient  Nephrologist: Dr. Arty [name].  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   We saw him in the HF Clinic for the first time earlier this month. [name]. Since that time underwent 3L paracentesis. Remains on torsemide 64m daily. Was in ER on 06/30/16 with acute urinary retention. Had Foley for 9 days. Saw Dr. WJeffie Pollockand catheter now out x 1 week. Feels he is diuresing better after paracentesis. Weight now down to 215 at home 220 here. (Was 242 earlier this month). Going to Wound Care for his leg. Says his breathing is good. Getting around better. More independent. No orthopnea or PND. Uses BiPAP at night. Appetite back.   Creatinine  2.7-> 3.4 -> 2.5 K 4.2  ICD interrogation - Pacer: AT/AF burden >99%, VP 74%. Two episodes of RVR.   Echo 09/07/15 LVEF 50-55%, RV mildly dilated, bioprosthetic AV and s/p tricuspid valve repair  Echo 12/04/15 LVEF 35-40%, mild MR, mild LAE, RV mildly dilated and moderately reduced, Mod RAE, PA peak pressure 39 mm Hg, bioprosthetic AV and s/p tricuspid valve repair  Echo 7/17 LVEF 40-45% RV severely HK. RVSP 463mHG bioprosthetic AV and s/p tricuspid valve repair  CT abdomen pelvis 12/06/15 with heterogenous hepatic steatosis and advanced atherosclerosis, status  post median sternotomy.   Past Medical History:  Diagnosis Date  . [place]fib ([place]   permanent with tachy-brady syndrome  . Anemia   . Anxiety   . Atrial flutter (HCC)    hx of  . Biceps tendon tear    right  . Bone marrow disease   . Cancer ([place]   basal cell  . CHF (congestive heart failure) ([place]  . Chronic kidney disease (CKD), stage III (moderate)    lov note dr coKoleen Nimrodephrology 09-17-13 on chart  . Complication of anesthesia 11-16-13   required alot of versed per anesthesia with cataract surgery  . Coronary heart disease    stent, CABG, RBBB  . DDD (degenerative disc disease)   . Diabetes ([facility]  . Diabetes mellitus   . Fungal toenail infection   . Gout   . Hemorrhagic cystitis 2012  . Hepatitis    mono  . HTN (hypertension)   . Hypercalcemia 2011   due to sarcoidiosis  . [facility]   [facility], 2002, 2006  . OSA (obstructive sleep apnea)    use bipap setting of 10 and 12  . Osteoarthritis   . Pneumonia 1966, 2011   hx of  . Presence of permanent cardiac pacemaker   . Right sciatic nerve pain    for block thursday 01-21-2014, injections  . 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 Shortness of breath dyspnea   . Synovial cyst of lumbar spine    l3-l4, l4-l5, injections  . Valvular heart disease  aortic stenosis/regurgitation    Current Outpatient Prescriptions  Medication Sig Dispense Refill  . acetaminophen (TYLENOL) 500 MG tablet Take 1,000 mg by mouth 2 (two) times daily as needed (pain).     [name] allopurinol (ZYLOPRIM) 100 MG tablet Take 200 mg by mouth daily with breakfast.     . dicyclomine (BENTYL) 10 MG capsule Take 10 mg by mouth 2 (two) times daily.    . insulin glargine (LANTUS) 100 unit/mL SOPN Inject 2-6 Units into the skin daily after breakfast. CBG 120-140 2 units, 141-160 3 units, 161-180 4 units, 181-200 5 units, 201-220 6 units    . metoprolol (LOPRESSOR) 50 MG tablet Take 1 tablet (50 mg total) by mouth 2 (two)  times daily. 60 tablet 1  . metoprolol tartrate (LOPRESSOR) 25 MG tablet Take 1 tablet (25 mg total) by mouth 2 (two) times daily. 180 tablet 0  . spironolactone (ALDACTONE) 25 MG tablet Take 0.5 tablets (12.5 mg total) by mouth daily. 45 tablet 3  . terazosin (HYTRIN) 5 MG capsule Take 1 capsule (5 mg total) by mouth at bedtime. 30 capsule 0  . Testosterone Cypionate 200 MG/ML KIT Inject 1 mL into the muscle every 14 (fourteen) days.    [name] torsemide (DEMADEX) 20 MG tablet Take 40 mg by mouth daily.    . traMADol (ULTRAM) 50 MG tablet Take 50-100 mg by mouth daily as needed for moderate pain.   0  . XARELTO 15 MG TABS tablet TAKE 1 TABLET (15 MG TOTAL) BY MOUTH DAILY. 30 tablet 11  . zolpidem (AMBIEN) 10 MG tablet Take 10 mg by mouth at bedtime.     . diazepam (VALIUM) 10 MG tablet Take 1 tablet by mouth daily as needed (ankle and leg cramps).   0  . diphenhydrAMINE (BENADRYL) 25 MG tablet Take 50 mg by mouth every 6 (six) hours as needed for allergies.     . diphenoxylate-atropine (LOMOTIL) 2.5-0.025 MG per tablet Take 2 tablets by mouth 2 (two) times daily as needed for diarrhea or loose stools. stomach  1  . Turmeric 500 MG TABS Take 1,000 mg by mouth at bedtime.     No current facility-administered medications for this encounter.     Allergies  Allergen Reactions  . Actos [Pioglitazone Hydrochloride] Swelling and Other (See Comments)    Severe peripheral edema  . Codeine Other (See Comments)    Makes Pt aggressive  . Depakote [Divalproex Sodium] Diarrhea    severe  . Diltiazem Other (See Comments)    lethargy and dyspnea  . Hydralazine Other (See Comments)    Severe chills and SOB   . Isosorbide Dinitrate Other (See Comments)    Severe chills and SOB   . Januvia [Sitagliptin] Diarrhea and Other (See Comments)    bradycardia  . Loop Diuretics Other (See Comments)    Spike in BUN and creatine levels  . Losartan Other (See Comments)    aphasia  . Nsaids Other (See Comments)     Renal problems  . Onglyza [Saxagliptin] Diarrhea and Other (See Comments)    bradycardia  . Orudis [Ketoprofen] Other (See Comments)    Cannot take due to renal insufficiency  . Penicillins Swelling and Other (See Comments)    Serum sickness Has patient had a PCN reaction causing immediate rash, facial/tongue/throat swelling, SOB or lightheadedness with hypotension: Yes Has patient had a PCN reaction causing severe rash involving mucus membranes or skin necrosis: No Has patient had a PCN reaction that required hospitalization  Yes Has patient had a PCN reaction occurring within the last 10 years: No If all of the above answers are "NO", then may proceed with Cephalosporin use.   [name] Potassium Iodide Itching and Rash    Severe entire body itching and rash  . Rozerem [Ramelteon] Diarrhea    Severe   . Amiodarone Other (See Comments)    diarrhea  . Latex Swelling  . Verapamil Other (See Comments)    Severe fatigue  . Benazepril Hcl Other (See Comments)    ? Possible lowers plateletes  . Indomethacin Other (See Comments)    Renal problems   . Minocycline Other (See Comments)    Makes dizzy and feel just lousy.  [name] Pentazocine Lactate Other (See Comments)    Unknown allergic reaction - pt and wife do not recall this  . Poison Ivy Extract [Extract Of Poison Ivy] Rash and Other (See Comments)    blisters  . Sertraline Hcl Other (See Comments)    Pt and wife do not recall this  . Shellfish Allergy Rash      Social History   Social History  . Marital status: Married    Spouse name: N/A  . Number of children: 4  . Years of education: N/A   Occupational History  . retired Administrator, [employer]     due to coronary disease in 2000  . 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 Smoking status: Former Smoker    Years: 28.00    Types: Pipe, Cigars    Quit date: 12/25/1995  . Smokeless tobacco: Never Used  . Alcohol use Yes     Comment: 3-5 beer or  wine per week  . Drug use: No  . Sexual activity: Not on file   Other Topics Concern  . Not on file   Social History Narrative  . No narrative on file      Family History  Problem Relation Age of Onset  . Heart attack Father   . Stroke Father   . Stroke Mother   . Allergies    . Asthma    . Heart disease    . Cancer      Vitals:   07/16/16 1437  BP: (!) 142/76  Pulse: 66  SpO2: 98%  Weight: 220 lb 4 oz (99.9 kg)    PHYSICAL EXAM: General:  Elderly appearing. No respiratory difficulty HEENT: normal Neck: supple. JVD to jaw with prominent cv wave. Carotids 2+ bilat; no bruits. No lymphadenopathy or thyromegaly appreciated. Cor: PMI nondisplaced. Irregularly irregular. 2/6 MR 2/6 TR Lungs: CTAB, normal effort Abdomen: Obese, non-tender,No hepatosplenomegaly. No bruits or masses. +BS Extremities: no cyanosis, clubbing, rash. Trace ankle edema R ankle. Left leg with trace to no edema left leg wrapped with dressing Neuro: alert & oriented x 3, cranial nerves grossly intact. moves all 4 extremities w/o difficulty. Affect pleasant.   ASSESSMENT & PLAN:  1. Chronic combined CHF : Echo 7/17 LVEF 40-45% RV severely HK. RVSP 6m HG bioprosthetic AV and s/p tricuspid valve repair 2. CAD status post CABG 2006 --Cath in 2012 with all grafts patent but severe disease in native vessels in OM and LAD territories 3. Aortic stenosis and tricuspid regurgitation status post bioprosthetic AVR 2012 and tricuspid valve repair in 05/2011 4. Chronic atrial fibrillation - AT/AF burden > 99% by pacemaker interrogation. 5. CKD Stage IV  6. DM 2  7. Anxiety  8. [name] 9. OHS/OSA - On BiPAP  10. Left leg wound  Echo reviewed personally. He has R>>L HF. Much improved after paracentesis. Still with significant RV failure and elevated CVP. Will increase spiro to 25 mg daily. Check BMET today and in 1 week. Will consider RHC as needed.   [name], Daniel,[name] 2:59 PM

## 2016-07-16 NOTE — Patient Instructions (Signed)
Increase Spironolactone to 25 mg (1 tab) daily  Lab today  Lab in 1 week  Your physician recommends that you schedule a follow-up appointment in: 6 weeks

## 2016-07-18 DIAGNOSIS — E11622 Type 2 diabetes mellitus with other skin ulcer: Secondary | ICD-10-CM | POA: Diagnosis not present

## 2016-07-19 ENCOUNTER — Encounter (HOSPITAL_COMMUNITY): Payer: Medicare Other | Admitting: Internal Medicine

## 2016-07-23 ENCOUNTER — Ambulatory Visit (HOSPITAL_COMMUNITY)
Admission: RE | Admit: 2016-07-23 | Discharge: 2016-07-23 | Disposition: A | Discharge status: Home / Self Care | Payer: Medicare Other | Source: Ambulatory Visit | Attending: Cardiology | Admitting: Cardiology

## 2016-07-23 DIAGNOSIS — I5032 Chronic diastolic (congestive) heart failure: Secondary | ICD-10-CM | POA: Insufficient documentation

## 2016-07-23 LAB — BASIC METABOLIC PANEL
ANION GAP: 9 (ref 5–15)
BUN: 88 mg/dL — ABNORMAL HIGH (ref 6–20)
CHLORIDE: 99 mmol/L — AB (ref 101–111)
CO2: 24 mmol/L (ref 22–32)
Calcium: 10 mg/dL (ref 8.9–10.3)
Creatinine, Ser: 3.18 mg/dL — ABNORMAL HIGH (ref 0.61–1.24)
GFR calc Af Amer: 20 mL/min — ABNORMAL LOW (ref >60)
GFR, EST NON AFRICAN AMERICAN: 18 mL/min — AB (ref >60)
GLUCOSE: 150 mg/dL — AB (ref 65–99)
POTASSIUM: 4.6 mmol/L (ref 3.5–5.1)
Sodium: 132 mmol/L — ABNORMAL LOW (ref 135–145)

## 2016-07-25 ENCOUNTER — Encounter (HOSPITAL_BASED_OUTPATIENT_CLINIC_OR_DEPARTMENT_OTHER): Payer: Medicare Other | Attending: Surgery

## 2016-07-25 DIAGNOSIS — I2510 Atherosclerotic heart disease of native coronary artery without angina pectoris: Secondary | ICD-10-CM | POA: Insufficient documentation

## 2016-07-25 DIAGNOSIS — I509 Heart failure, unspecified: Secondary | ICD-10-CM | POA: Insufficient documentation

## 2016-07-25 DIAGNOSIS — E11621 Type 2 diabetes mellitus with foot ulcer: Secondary | ICD-10-CM | POA: Diagnosis present

## 2016-07-25 DIAGNOSIS — G4730 Sleep apnea, unspecified: Secondary | ICD-10-CM | POA: Insufficient documentation

## 2016-07-25 DIAGNOSIS — L97821 Non-pressure chronic ulcer of other part of left lower leg limited to breakdown of skin: Secondary | ICD-10-CM | POA: Diagnosis not present

## 2016-07-25 DIAGNOSIS — I110 Hypertensive heart disease with heart failure: Secondary | ICD-10-CM | POA: Insufficient documentation

## 2016-07-25 DIAGNOSIS — M1990 Unspecified osteoarthritis, unspecified site: Secondary | ICD-10-CM | POA: Insufficient documentation

## 2016-07-26 ENCOUNTER — Encounter: Payer: Self-pay | Admitting: Cardiovascular Disease

## 2016-08-01 DIAGNOSIS — E11621 Type 2 diabetes mellitus with foot ulcer: Secondary | ICD-10-CM | POA: Diagnosis not present

## 2016-08-06 ENCOUNTER — Encounter: Payer: Self-pay | Admitting: Cardiovascular Disease

## 2016-08-08 DIAGNOSIS — E11621 Type 2 diabetes mellitus with foot ulcer: Secondary | ICD-10-CM | POA: Diagnosis not present

## 2016-08-31 ENCOUNTER — Encounter (HOSPITAL_COMMUNITY): Payer: Medicare Other | Admitting: Internal Medicine

## 2016-09-04 ENCOUNTER — Other Ambulatory Visit: Payer: Self-pay | Admitting: Cardiovascular Disease

## 2016-09-12 ENCOUNTER — Encounter: Payer: Self-pay | Admitting: Internal Medicine

## 2016-09-26 ENCOUNTER — Ambulatory Visit (INDEPENDENT_AMBULATORY_CARE_PROVIDER_SITE_OTHER): Payer: Medicare Other | Admitting: Cardiovascular Disease

## 2016-09-26 ENCOUNTER — Encounter: Payer: Self-pay | Admitting: Internal Medicine

## 2016-09-26 ENCOUNTER — Other Ambulatory Visit: Payer: Self-pay

## 2016-09-26 ENCOUNTER — Encounter: Payer: Self-pay | Admitting: Cardiovascular Disease

## 2016-09-26 ENCOUNTER — Ambulatory Visit (INDEPENDENT_AMBULATORY_CARE_PROVIDER_SITE_OTHER): Payer: Medicare Other | Admitting: Internal Medicine

## 2016-09-26 VITALS — BP 128/66 | HR 81 | Ht 71.0 in | Wt 236.2 lb

## 2016-09-26 VITALS — BP 128/66 | HR 81 | Ht 71.0 in | Wt 236.0 lb

## 2016-09-26 DIAGNOSIS — I482 Chronic atrial fibrillation, unspecified: Secondary | ICD-10-CM

## 2016-09-26 DIAGNOSIS — R188 Other ascites: Secondary | ICD-10-CM

## 2016-09-26 DIAGNOSIS — I495 Sick sinus syndrome: Secondary | ICD-10-CM

## 2016-09-26 DIAGNOSIS — I5022 Chronic systolic (congestive) heart failure: Secondary | ICD-10-CM

## 2016-09-26 DIAGNOSIS — I50810 Right heart failure, unspecified: Secondary | ICD-10-CM

## 2016-09-26 DIAGNOSIS — Z952 Presence of prosthetic heart valve: Secondary | ICD-10-CM

## 2016-09-26 DIAGNOSIS — I5043 Acute on chronic combined systolic (congestive) and diastolic (congestive) heart failure: Secondary | ICD-10-CM

## 2016-09-26 DIAGNOSIS — R001 Bradycardia, unspecified: Secondary | ICD-10-CM

## 2016-09-26 DIAGNOSIS — I2510 Atherosclerotic heart disease of native coronary artery without angina pectoris: Secondary | ICD-10-CM

## 2016-09-26 MED ORDER — METOPROLOL TARTRATE 50 MG PO TABS: 50.0000 mg | ORAL_TABLET | Freq: Two times a day (BID) | ORAL | 11 refills | Status: DC

## 2016-09-26 NOTE — Patient Instructions (Signed)
Medication Instructions:  DECREASE Metoprolol to 50 mg twice daily   Labwork: None Ordered   Testing/Procedures: None Ordered   Follow-Up: Your physician wants you to follow-up in: 6 months with  Dr. Acie Fredrickson.  You will receive a reminder letter in the mail two months in advance. If you don't receive a letter, please call our office to schedule the follow-up appointment.   If you need a refill on your cardiac medications before your next appointment, please call your pharmacy.   Thank you for choosing [facility]! Christen Bame, RN [phone]

## 2016-09-26 NOTE — Progress Notes (Signed)
Cardiology Office Note   Date:  09/26/2016   ID:  [name], MD, DOB 1941/10/06, MRN [id number]  PCP:  Orpah Melter, MD  Cardiologist:   Mertie Moores, MD , previous [place] patient   Chief Complaint  Patient presents with  . 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 Able to sing without any difficulty BP and weight are up due to steroid dosing   Sad - his poodle died this past 02/14/2023.   06/24/2016:  Doing ok Still gets fatigued easily.   Walking down the main hall at [facility], has to stop 3 times to rest.   Perhaps some slight dizziness,  Has had some vertigo symptoms   No CP   Bp is typically lower .  A bit elevated today   Sep 26, 2016:  Here for pacer check and follow up . Will also dee [name] tomorrow  Seems to be holding steady  Creatinine is 2.95,  BUN is 70  Glucose levels have been ok  Had a calf injury since I've seen him .  Has gained 25 lbs - has lots of ascites Thinks he needs another paracentesis.   Is not eating meat - has lost his taste for meat. Eats scrambled eggs.     Past Medical History:  Diagnosis Date  . A-fib ([place])    permanent with tachy-brady syndrome  . Anemia   . Anxiety   . Atrial flutter (HCC)    hx of  . Biceps tendon tear    right  . Bone marrow disease   . Cancer ([place])    basal cell  . CHF (congestive heart failure) ([place])   . Chronic kidney disease (CKD), stage III (moderate)    lov note dr Koleen Nimrod nephrology 09-17-13 on chart  . Complication of anesthesia 11-16-13   required alot of versed per anesthesia with cataract surgery  . Coronary heart disease    stent, CABG, RBBB  . DDD  (degenerative disc disease)   . Diabetes ([place])   . Diabetes mellitus   . Fungal toenail infection   . Gout   . Hemorrhagic cystitis 2012  . Hepatitis    mono  . HTN (hypertension)   . Hypercalcemia 2011   due to sarcoidiosis  . Myocardial infarction    1995, 2002, 2006  . OSA (obstructive sleep apnea)    use bipap setting of 10 and 12  . Osteoarthritis   . Pneumonia 1966, 2011   hx of  . Presence of permanent cardiac pacemaker   . Right sciatic nerve pain    for block thursday 01-21-2014, injections  . Sarcoid ([place]) 2011   pulmonary and bone marrow  . Shortness of breath dyspnea   . Synovial cyst of lumbar spine    l3-l4, l4-l5, injections  . Valvular heart disease    aortic stenosis/regurgitation    Past Surgical History:  Procedure Laterality Date  . BASAL CELL CARCINOMA EXCISION Right 2015   ear  . CARDIOVERSION N/A 12/30/2013   Procedure: CARDIOVERSION;  Surgeon: [name],  MD;  [facility];  Service: Cardiovascular;  Laterality: N/A;  10:49 cardioversion at 120 joules, then 150 joules, to SB  used  Lido 59m,  Propofol 160 mcg  . CARDIOVERSION  2003, 2006, 2012, 2013  . cataract surgery Right 2007  . cataract surgery Left 11-16-13  . COLONOSCOPY N/A 01/29/2014   Procedure: COLONOSCOPY;  Surgeon: JWinfield Cunas, MD;  [facility];  Service: Endoscopy;  Laterality: N/A;  amanda//ja  . CORONARY ARTERY BYPASS GRAFT  2006   x 5  . EP IMPLANTABLE DEVICE N/A 05/05/2015   Procedure: Pacemaker Implant;  Surgeon: JThompson Grayer MD;  [facility];  Service: Cardiovascular;  Laterality: N/A;  . FLEXIBLE SIGMOIDOSCOPY N/A 12/07/2015   Procedure: FLEXIBLE SIGMOIDOSCOPY;  Surgeon: VWilford Corner MD;  [facility];  Service: Endoscopy;  Laterality: N/A;  Unprepped  . INSERT / REPLACE / REMOVE PACEMAKER  05/05/2015  . PORTACATH PLACEMENT    . portacath removed    . Redo Median sternotomy, extracorporeal cirulation, AVR,  Tricuspid valve repair  06/13/2011   AVR(23-mm Edwards pericardial Magna-Ease valve./ TVrepair (34-mm Edwards MC3 annuloplasty ring  . TOTAL KNEE ARTHROPLASTY Right 05/24/2014   Procedure: RIGHT TOTAL KNEE ARTHROPLASTY;  Surgeon: FGearlean Alf MD;  [facility];  Service: Orthopedics;  Laterality: Right;  . TRANSURETHRAL RESECTION OF PROSTATE  oct. 2012   TURP  . VASECTOMY  1977     Current Outpatient Prescriptions  Medication Sig Dispense Refill  . acetaminophen (TYLENOL) 500 MG tablet Take 1,000 mg by mouth 2 (two) times daily as needed (pain).     .Marland Kitchenallopurinol (ZYLOPRIM) 100 MG tablet Take 200 mg by mouth daily with breakfast.     . cetirizine (ZYRTEC) 10 MG tablet Take 10 mg by mouth every evening.    . diazepam (VALIUM) 10 MG tablet Take 1 tablet by mouth daily as needed (ankle and leg cramps).   0  . dicyclomine (BENTYL) 10 MG capsule Take 10 mg by mouth daily.     . diphenoxylate-atropine (LOMOTIL) 2.5-0.025 MG per tablet Take 2 tablets by mouth 2 (two) times daily as needed for diarrhea or loose stools. stomach  1  . insulin glargine (LANTUS) 100 unit/mL SOPN Inject 2-6 Units into the skin daily after breakfast. CBG 120-140 2 units, 141-160 3 units, 161-180 4 units, 181-200 5 units, 201-220 6 units    . LevOCARNitine L-Tartrate (L-CARNITINE) 500 MG CAPS Take 500 mg by mouth 2 (two) times daily.    . metoprolol (LOPRESSOR) 50 MG tablet Take 1 tablet (50 mg total) by mouth 2 (two) times daily. 60 tablet 11  . Saccharomyces boulardii (FLORASTOR PO) Take 1 capsule by mouth 2-3 times a week    . sucroferric oxyhydroxide (VELPHORO) 500 MG chewable tablet Chew 500 mg by mouth 3 (three) times daily with meals.    . terazosin (HYTRIN) 5 MG capsule Take 5 mg by mouth every morning.    . Testosterone Cypionate 200 MG/ML KIT Inject 1 mL into the muscle every 14 (fourteen) days.    .Marland Kitchentorsemide (DEMADEX) 20 MG tablet Take 40 mg by mouth once daily and take 20 mg every other day in the  afternoon    . traMADol (ULTRAM) 50 MG tablet Take 50-100 mg by mouth daily as needed for moderate pain.   0  . Turmeric 500 MG TABS Take 1,000 mg by mouth at bedtime.    .Alveda Reasons15 MG TABS tablet TAKE 1 TABLET (  15 MG TOTAL) BY MOUTH DAILY. 30 tablet 11  . zolpidem (AMBIEN) 10 MG tablet Take 10 mg by mouth at bedtime.      No current facility-administered medications for this visit.     Allergies:   Actos [pioglitazone hydrochloride]; Codeine; Depakote [divalproex sodium]; Diltiazem; Hydralazine; Isosorbide dinitrate; Januvia [sitagliptin]; Loop diuretics; Losartan; Nsaids; Onglyza [saxagliptin]; Orudis [ketoprofen]; Penicillins; Potassium iodide; Rozerem [ramelteon]; Amiodarone; Latex; Verapamil; Benazepril hcl; Indomethacin; Minocycline; Pentazocine lactate; Poison ivy extract [poison ivy extract]; Sertraline hcl; and Shellfish allergy    Social History:  The patient  reports that he quit smoking about 20 years ago. His smoking use included Pipe and Cigars. He quit after 28.00 years of use. He has never used smokeless tobacco. He reports that he drinks alcohol. He reports that he does not use drugs.   Family History:  The patient's family history includes Heart attack in his father; Stroke in his father and mother.    ROS:  Please see the history of present illness.    Review of Systems: Constitutional:  denies fever, chills, diaphoresis, appetite change and fatigue.  HEENT: denies photophobia, eye pain, redness, hearing loss, ear pain, congestion, sore throat, rhinorrhea, sneezing, neck pain, neck stiffness and tinnitus.  Respiratory: denies SOB, DOE, cough, chest tightness, and wheezing.  Cardiovascular: denies chest pain, palpitations and leg swelling.  Gastrointestinal: denies nausea, vomiting, abdominal pain, diarrhea, constipation, blood in stool.  Genitourinary: denies dysuria, urgency, frequency, hematuria, flank pain and difficulty urinating.  Musculoskeletal: denies   myalgias, back pain, joint swelling, arthralgias and gait problem.   Skin: denies pallor, rash and wound.  Neurological: denies dizziness, seizures, syncope, weakness, light-headedness, numbness and headaches.   Hematological: denies adenopathy, easy bruising, personal or family bleeding history.  Psychiatric/ Behavioral: denies suicidal ideation, mood changes, confusion, nervousness, sleep disturbance and agitation.       All other systems are reviewed and negative.    PHYSICAL EXAM: VS:  BP 128/66   Pulse 81   Ht _0  (1.803 m)   Wt 236 lb (107 kg)   BMI 32.92 kg/m  , BMI Body mass index is 32.92 kg/m. GEN: Well nourished, well developed, in no acute distress  HEENT: normal  Neck: no JVD, carotid bruits, or masses Cardiac: RRR; no murmurs, rubs, or gallops,no edema  Respiratory:  clear to auscultation bilaterally, normal work of breathing GI: soft, nontender, nondistended, + BS. ++ ascites  MS: no deformity or atrophy  Skin: warm and dry, no rash Neuro:  Strength and sensation are intact Psych: normal   EKG:  EKG is ordered today. Reveals atrial fib with demand V pacing   Recent Labs: 12/02/2015: B Natriuretic Peptide 1,383.6 12/03/2015: TSH 2.745 06/30/2016: ALT 12; Hemoglobin 13.6; Platelets 77 07/23/2016: BUN 88; Creatinine, Ser 3.18; Potassium 4.6; Sodium 132    Lipid Panel    Component Value Date/Time   CHOL 106 05/28/2014 0140   TRIG 169 (H) 05/28/2014 0140   HDL 14 (L) 05/28/2014 0140   CHOLHDL 7.6 05/28/2014 0140   VLDL 34 05/28/2014 0140   LDLCALC 58 05/28/2014 0140      Wt Readings from Last 3 Encounters:  09/26/16 236 lb (107 kg)  09/26/16 236 lb 3.2 oz (107.1 kg)  07/16/16 220 lb 4 oz (99.9 kg)      Other studies Reviewed: Additional studies/ records that were reviewed today include: . Review of the above records demonstrates:    ASSESSMENT AND PLAN:  1.  Chronic Combined systolic and diastolic congestive  heart failure. His  echocardiogram this past winter which revealed  reduction of his left ventricle systolic function down to 35-40%. He has known diastolic dysfunction. He's developed cardiorenal syndrome. His creatinine is fairly stable at 2.7. Has significant ascietes Will reduce the metoprolol to 50 mg BID   Is seeing Dr. Haroldine Laws  Will see him tomorrow [name] thinks that he would benefit from another paracentesis    2. Chroic kidney disese Further plans per nephrology.  3. CAD - CABG   Current medicines are reviewed at length with the patient today.  The patient does not have concerns regarding medicines.  The following changes have been made:  no change  Labs/ tests ordered today include:   Orders Placed This Encounter  Procedures  . EKG 12-Lead     Disposition:   FU with me in 6 months     Mertie Moores, MD  09/26/2016 6:00 PM    [address], [place], [place]  [address] Phone: [phone]; Fax: [phone]) [phone]  .

## 2016-09-26 NOTE — Patient Instructions (Signed)
Medication Instructions:  Your physician recommends that you continue on your current medications as directed. Please refer to the Current Medication list given to you today.   Labwork: None ordered   Testing/Procedures: None ordered   Follow-Up: Your physician wants you to follow-up in: 12 months with Dr Rayann Heman Dennis Bast will receive a reminder letter in the mail two months in advance. If you don't receive a letter, please call our office to schedule the follow-up appointment.   Remote monitoring is used to monitor your Pacemaker from home. This monitoring reduces the number of office visits required to check your device to one time per year. It allows [place] to keep an eye on the functioning of your device to ensure it is working properly. You are scheduled for a device check from home on 12/26/16. You may send your transmission at any time that day. If you have a wireless device, the transmission will be sent automatically. After your physician reviews your transmission, you will receive a postcard with your next transmission date.     Any Other Special Instructions Will Be Listed Below (If Applicable).     If you need a refill on your cardiac medications before your next appointment, please call your pharmacy.

## 2016-09-27 ENCOUNTER — Encounter (HOSPITAL_COMMUNITY): Payer: Self-pay | Admitting: Internal Medicine

## 2016-09-27 ENCOUNTER — Ambulatory Visit (HOSPITAL_COMMUNITY)
Admission: RE | Admit: 2016-09-27 | Discharge: 2016-09-27 | Disposition: A | Discharge status: Home / Self Care | Payer: Medicare Other | Source: Ambulatory Visit | Attending: Internal Medicine | Admitting: Internal Medicine

## 2016-09-27 VITALS — BP 128/70 | HR 63 | Wt 232.5 lb

## 2016-09-27 DIAGNOSIS — Z952 Presence of prosthetic heart valve: Secondary | ICD-10-CM

## 2016-09-27 DIAGNOSIS — I482 Chronic atrial fibrillation, unspecified: Secondary | ICD-10-CM

## 2016-09-27 DIAGNOSIS — Z79899 Other long term (current) drug therapy: Secondary | ICD-10-CM | POA: Insufficient documentation

## 2016-09-27 DIAGNOSIS — E1122 Type 2 diabetes mellitus with diabetic chronic kidney disease: Secondary | ICD-10-CM | POA: Insufficient documentation

## 2016-09-27 DIAGNOSIS — Z951 Presence of aortocoronary bypass graft: Secondary | ICD-10-CM | POA: Diagnosis not present

## 2016-09-27 DIAGNOSIS — F419 Anxiety disorder, unspecified: Secondary | ICD-10-CM | POA: Diagnosis not present

## 2016-09-27 DIAGNOSIS — I5042 Chronic combined systolic (congestive) and diastolic (congestive) heart failure: Secondary | ICD-10-CM | POA: Insufficient documentation

## 2016-09-27 DIAGNOSIS — I082 Rheumatic disorders of both aortic and tricuspid valves: Secondary | ICD-10-CM | POA: Diagnosis not present

## 2016-09-27 DIAGNOSIS — Z7901 Long term (current) use of anticoagulants: Secondary | ICD-10-CM | POA: Insufficient documentation

## 2016-09-27 DIAGNOSIS — G4733 Obstructive sleep apnea (adult) (pediatric): Secondary | ICD-10-CM | POA: Insufficient documentation

## 2016-09-27 DIAGNOSIS — Z87891 Personal history of nicotine dependence: Secondary | ICD-10-CM | POA: Diagnosis not present

## 2016-09-27 DIAGNOSIS — I5032 Chronic diastolic (congestive) heart failure: Secondary | ICD-10-CM

## 2016-09-27 DIAGNOSIS — I130 Hypertensive heart and chronic kidney disease with heart failure and stage 1 through stage 4 chronic kidney disease, or unspecified chronic kidney disease: Secondary | ICD-10-CM | POA: Diagnosis not present

## 2016-09-27 DIAGNOSIS — R188 Other ascites: Secondary | ICD-10-CM | POA: Diagnosis not present

## 2016-09-27 DIAGNOSIS — I2510 Atherosclerotic heart disease of native coronary artery without angina pectoris: Secondary | ICD-10-CM | POA: Diagnosis not present

## 2016-09-27 DIAGNOSIS — Z794 Long term (current) use of insulin: Secondary | ICD-10-CM | POA: Insufficient documentation

## 2016-09-27 DIAGNOSIS — N184 Chronic kidney disease, stage 4 (severe): Secondary | ICD-10-CM | POA: Diagnosis not present

## 2016-09-27 DIAGNOSIS — K761 Chronic passive congestion of liver: Secondary | ICD-10-CM | POA: Diagnosis not present

## 2016-09-27 DIAGNOSIS — I50810 Right heart failure, unspecified: Secondary | ICD-10-CM | POA: Diagnosis not present

## 2016-09-27 DIAGNOSIS — Z953 Presence of xenogenic heart valve: Secondary | ICD-10-CM | POA: Diagnosis not present

## 2016-09-27 NOTE — Progress Notes (Signed)
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 He continues to have marked decline.  He has developed worsening CHF and is now followed in the CHF clinic.  He has frequent ascites and has required paracentesis.  He has + edema.  + SOB.  Today, he denies symptoms of palpitations, chest pain,  dizziness, presyncope, or syncope.  The patient is otherwise without complaint today.   Past Medical History:  Diagnosis Date  . A-fib ([place])    permanent with tachy-brady syndrome  . Anemia   . Anxiety   . Atrial flutter (HCC)    hx of  . Biceps tendon tear    right  . Bone marrow disease   . Cancer ([place])    basal cell  . CHF (congestive heart failure) ([place])   . Chronic kidney disease (CKD), stage III (moderate)    lov note dr Koleen Nimrod nephrology 09-17-13 on chart  . Complication of anesthesia 11-16-13   required alot of versed per anesthesia with cataract surgery  . Coronary heart disease    stent, CABG, RBBB  . DDD (degenerative disc disease)   . Diabetes ([place])   . Diabetes mellitus   . Fungal toenail infection   . Gout   . Hemorrhagic cystitis 2012  . Hepatitis    mono  . HTN (hypertension)   . Hypercalcemia 2011   due to sarcoidiosis  . Myocardial infarction    1995, 2002, 2006  . OSA (obstructive sleep apnea)    use bipap setting of 10 and 12  . Osteoarthritis   . Pneumonia 1966, 2011   hx of  . Presence of permanent cardiac pacemaker   . Right sciatic nerve pain    for block thursday 01-21-2014, injections  . 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 Shortness of breath dyspnea   . Synovial cyst of lumbar spine    l3-l4, l4-l5, injections  . Valvular heart disease    aortic stenosis/regurgitation   Past Surgical History:  Procedure Laterality Date  . BASAL CELL CARCINOMA EXCISION Right 2015   ear  . CARDIOVERSION N/A 12/30/2013   Procedure:  CARDIOVERSION;  Surgeon: [name], MD;  Location: [facility] ENDOSCOPY;  Service: Cardiovascular;  Laterality: N/A;  10:49 cardioversion at 120 joules, then 150 joules, to SB  used  Lido 58m,  Propofol 160 mcg  . CARDIOVERSION  2003, 2006, 2012, 2013  . cataract surgery Right 2007  . cataract surgery Left 11-16-13  . COLONOSCOPY N/A 01/29/2014   Procedure: COLONOSCOPY;  Surgeon: JWinfield Cunas, MD;  [facility];  Service: Endoscopy;  Laterality: N/A;  amanda//ja  . CORONARY ARTERY BYPASS GRAFT  2006   x 5  . EP IMPLANTABLE DEVICE N/A 05/05/2015   Procedure: Pacemaker Implant;  Surgeon: JThompson Grayer MD;  [facility];  Service: Cardiovascular;  Laterality: N/A;  . FLEXIBLE SIGMOIDOSCOPY N/A 12/07/2015   Procedure: FLEXIBLE SIGMOIDOSCOPY;  Surgeon: VWilford Corner MD;  [facility];  Service: Endoscopy;  Laterality: N/A;  Unprepped  . INSERT / REPLACE / REMOVE PACEMAKER  05/05/2015  . PORTACATH PLACEMENT    . portacath removed    . Redo Median sternotomy, extracorporeal cirulation, AVR, Tricuspid valve repair  06/13/2011   AVR(23-mm Edwards pericardial Magna-Ease valve./ TVrepair (34-mm Edwards MC3 annuloplasty ring  . TOTAL KNEE ARTHROPLASTY Right 05/24/2014   Procedure: RIGHT  TOTAL KNEE ARTHROPLASTY;  Surgeon: Gearlean Alf, MD;  [facility];  Service: Orthopedics;  Laterality: Right;  . TRANSURETHRAL RESECTION OF PROSTATE  oct. 2012   TURP  . VASECTOMY  1977    ROS- all systems are reviewed and negative except as per HPI above  Current Outpatient Prescriptions  Medication Sig Dispense Refill  . acetaminophen (TYLENOL) 500 MG tablet Take 1,000 mg by mouth 2 (two) times daily as needed (pain).     [name] allopurinol (ZYLOPRIM) 100 MG tablet Take 200 mg by mouth daily with breakfast.     . cetirizine (ZYRTEC) 10 MG tablet Take 10 mg by mouth every evening.    . diazepam (VALIUM) 10 MG tablet Take 1 tablet by mouth daily as needed (ankle and leg  cramps).   0  . dicyclomine (BENTYL) 10 MG capsule Take 10 mg by mouth daily.     . diphenoxylate-atropine (LOMOTIL) 2.5-0.025 MG per tablet Take 2 tablets by mouth 2 (two) times daily as needed for diarrhea or loose stools. stomach  1  . insulin glargine (LANTUS) 100 unit/mL SOPN Inject 2-6 Units into the skin daily after breakfast. CBG 120-140 2 units, 141-160 3 units, 161-180 4 units, 181-200 5 units, 201-220 6 units    . LevOCARNitine L-Tartrate (L-CARNITINE) 500 MG CAPS Take 500 mg by mouth 2 (two) times daily.    . Saccharomyces boulardii (FLORASTOR PO) Take 1 capsule by mouth 2-3 times a week    . sucroferric oxyhydroxide (VELPHORO) 500 MG chewable tablet Chew 500 mg by mouth 3 (three) times daily with meals.    . terazosin (HYTRIN) 5 MG capsule Take 5 mg by mouth every morning.    . Testosterone Cypionate 200 MG/ML KIT Inject 1 mL into the muscle every 14 (fourteen) days.    [name] torsemide (DEMADEX) 20 MG tablet Take 40 mg by mouth once daily and take 20 mg every other day in the afternoon    . traMADol (ULTRAM) 50 MG tablet Take 50-100 mg by mouth daily as needed for moderate pain.   0  . Turmeric 500 MG TABS Take 1,000 mg by mouth at bedtime.    Alveda Reasons 15 MG TABS tablet TAKE 1 TABLET (15 MG TOTAL) BY MOUTH DAILY. 30 tablet 11  . zolpidem (AMBIEN) 10 MG tablet Take 10 mg by mouth at bedtime.     . metoprolol (LOPRESSOR) 50 MG tablet Take 1 tablet (50 mg total) by mouth 2 (two) times daily. 60 tablet 11   No current facility-administered medications for this visit.     Physical Exam: Vitals:   09/26/16 1158  BP: 128/66  Pulse: 81  Weight: 236 lb 3.2 oz (107.1 kg)  Height: 5' 11"  (1.803 m)    GEN- The patient is ill appearing, alert and oriented x 3 today.   Head- normocephalic, atraumatic Eyes-  Sclera clear, conjunctiva pink Ears- hearing intact Oropharynx- clear Lungs- Clear to ausculation bilaterally, normal work of breathing Chest- pacemaker pocket is well healed Heart-  Regular rate and rhythm (paced) GI- distended + large ascites Extremities- no clubbing, cyanosis, + edema  Pacemaker interrogation- reviewed in detail today,  See PACEART report  Assessment and Plan:  1. Tachy/brady syndrome Normal pacemaker function See Claudia Desanctis Art report The patient is convinced that prior VVIR programming caused his decompensation though I think that this is very unlikely.  I have therefore returned him to DDDR pacing.  I would like to reduce RV pacing if possible.  I  have therefore reduced lower pacing rate from 60 to 50 bpm.  I have advised that he reduce metoprolol to allow his intrinsic heart rate to come up.  He does not wish to make this change at this time.  He may consider this in the future. He would be a poor candidate for device upgrade and I would like to avoid this if possible.  2. Permanent afib Well rate controlled Continue life long anticoagulation  3. 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  RV failure is present. Sodium restriction is advised Will need close follow-up with CHFclinic going forward  Merlin Return to see Dr Acie Fredrickson later today  Follow-up in CHF clinic as scheduled Return to see me in 1 year  Thompson Grayer MD, [facility]

## 2016-09-27 NOTE — Addendum Note (Signed)
Encounter addended by: Effie Berkshire, RN on: 09/27/2016  2:37 PM<BR>    Actions taken: Order Entry activity accessed, Diagnosis association updated, Sign clinical note

## 2016-09-27 NOTE — Patient Instructions (Signed)
No labs today.  No changes in medication today.  Paracentesis next Wednesday 10/11/12017 at 10:00 am at [facility]. Please arrive 15-20 minutes before procedure.  Follow up 2 months with Dr. Haroldine Laws.  Do the following things EVERYDAY: 1) Weigh yourself in the morning before breakfast. Write it down and keep it in a log. 2) Take your medicines as prescribed 3) Eat low salt foods-Limit salt (sodium) to 2000 mg per day.  4) Stay as active as you can everyday 5) Limit all fluids for the day to less than 2 liters

## 2016-09-27 NOTE — Progress Notes (Signed)
[name] ID: [name] Fiddler, MD, male   DOB: 21-Aug-1941, 75 y.o.   MRN: [id number]    Advanced Heart Failure Clinic Note   Referring Physician: Dr. Acie Fredrickson PCP: Betty Martinique, MD Cardiologist: Mertie Moores, MD , previous Brackbill [name]  Nephrologist: Dr. Arty Baumgartner.  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   We saw him in the HF Clinic for the first time in July 2017. [name] volume overloaded with R>>L heart failure and ascites. Underwent 3L paracentesis.    Here for routine f/u. Remains on torsemide 33m daily alternating with 40/20. Weight got down to 195 after paracentesis. Has slowly gotten back up to 226. Belly is swelling more. More SOB. No orthopnea or PND. Uses BiPAP at night. Appetite back. LLE wound healed. Off spiro due to increasing BUN. Dr. NAcie Fredricksonreduced metoprolol earlier this week.   Creatinine  Running 2.7-3.1 K 4.2  ICD interrogation - Pacer: AT/AF burden >99%, VP 74%. Two episodes of RVR.    Studies:  Echo 09/07/15 LVEF 50-55%, RV mildly dilated, bioprosthetic AV and s/p tricuspid valve repair  Echo 12/04/15 LVEF 35-40%, mild MR, mild LAE, RV mildly dilated and moderately reduced, Mod RAE, PA peak pressure 39 mm Hg, bioprosthetic AV and s/p tricuspid valve repair  Echo 7/17 LVEF 40-45% RV severely HK. RVSP 434mHG bioprosthetic AV and s/p tricuspid valve repair  CT abdomen pelvis 12/06/15 with heterogenous hepatic steatosis and advanced atherosclerosis, status post median sternotomy.   Past Medical History:  Diagnosis Date  . A-fib (HCP[place]     permanent with tachy-brady syndrome  . Anemia   . Anxiety   . Atrial flutter (HCC)    hx of  . Biceps tendon tear    right  . Bone marrow disease   . Cancer ([place]   basal cell  . CHF (congestive heart failure) (HCM[place]  . Chronic kidney disease (CKD), stage III (moderate)    lov note dr coKoleen Nimrodephrology 09-17-13 on chart  . Complication of anesthesia 11-16-13   required alot of versed per anesthesia with cataract surgery  . Coronary heart disease    stent, CABG, RBBB  . DDD (degenerative disc disease)   . Diabetes ([place]  . Diabetes mellitus   . Fungal toenail infection   . Gout   . Hemorrhagic cystitis 2012  . Hepatitis    mono  . HTN (hypertension)   . Hypercalcemia 2011   due to sarcoidiosis  . Myocardial infarction    1995, 2002, 2006  . OSA (obstructive sleep apnea)    use bipap setting of 10 and 12  . Osteoarthritis   . Pneumonia 1966, 2011   hx of  . Presence of permanent cardiac pacemaker   . Right sciatic nerve pain    for block thursday 01-21-2014, injections  . Sarcoid (HCS[place]ann[place]2011   pulmonary and bone marrow  . Shortness of breath dyspnea   . Synovial cyst of lumbar spine    l3-l4, l4-l5, injections  . Valvular heart disease    aortic stenosis/regurgitation    Current Outpatient Prescriptions  Medication Sig Dispense Refill  .  allopurinol (ZYLOPRIM) 100 MG tablet Take 200 mg by mouth daily with breakfast.     . cetirizine (ZYRTEC) 10 MG tablet Take 10 mg by mouth every evening.    . diazepam (VALIUM) 10 MG tablet Take 1 tablet by mouth daily as needed (ankle and leg cramps).   0  . dicyclomine (BENTYL) 10 MG capsule Take 10 mg by mouth daily.     . diphenoxylate-atropine (LOMOTIL) 2.5-0.025 MG per tablet Take 2 tablets by mouth 2 (two) times daily as needed for diarrhea or loose stools. stomach  1  . insulin glargine (LANTUS) 100 unit/mL SOPN Inject 2-6 Units into the skin daily after breakfast. CBG 120-140 2 units, 141-160 3 units, 161-180 4  units, 181-200 5 units, 201-220 6 units    . metoprolol (LOPRESSOR) 50 MG tablet Take 1 tablet (50 mg total) by mouth 2 (two) times daily. 60 tablet 11  . Saccharomyces boulardii (FLORASTOR PO) Take 1 capsule by mouth 2-3 times a week    . sucroferric oxyhydroxide (VELPHORO) 500 MG chewable tablet Chew 500 mg by mouth 3 (three) times daily with meals.    . terazosin (HYTRIN) 5 MG capsule Take 5 mg by mouth every morning.    . Testosterone Cypionate 200 MG/ML KIT Inject 1 mL into the muscle every 14 (fourteen) days.    [name] torsemide (DEMADEX) 20 MG tablet Take 40 mg by mouth once daily and take 20 mg every other day in the afternoon    . Turmeric 500 MG TABS Take 1,000 mg by mouth at bedtime.    Alveda Reasons 15 MG TABS tablet TAKE 1 TABLET (15 MG TOTAL) BY MOUTH DAILY. 30 tablet 11  . zolpidem (AMBIEN) 10 MG tablet Take 10 mg by mouth at bedtime.     [name] acetaminophen (TYLENOL) 500 MG tablet Take 1,000 mg by mouth 2 (two) times daily as needed (pain).     . LevOCARNitine L-Tartrate (L-CARNITINE) 500 MG CAPS Take 500 mg by mouth 2 (two) times daily.    . traMADol (ULTRAM) 50 MG tablet Take 50-100 mg by mouth daily as needed for moderate pain.   0   No current facility-administered medications for this encounter.     Allergies  Allergen Reactions  . Actos [Pioglitazone Hydrochloride] Swelling and Other (See Comments)    Severe peripheral edema  . Codeine Other (See Comments)    Makes Pt aggressive  . Depakote [Divalproex Sodium] Diarrhea    severe  . Diltiazem Other (See Comments)    lethargy and dyspnea  . Hydralazine Other (See Comments)    Severe chills and SOB   . Isosorbide Dinitrate Other (See Comments)    Severe chills and SOB   . Januvia [Sitagliptin] Diarrhea and Other (See Comments)    bradycardia  . Loop Diuretics Other (See Comments)    Spike in BUN and creatine levels  . Losartan Other (See Comments)    aphasia  . Nsaids Other (See Comments)    Renal problems  . Onglyza  [Saxagliptin] Diarrhea and Other (See Comments)    bradycardia  . Orudis [Ketoprofen] Other (See Comments)    Cannot take due to renal insufficiency  . Penicillins Swelling and Other (See Comments)    Serum sickness Has [name] had a PCN reaction causing immediate rash, facial/tongue/throat swelling, SOB or lightheadedness with hypotension: Yes Has [name] had a PCN reaction causing severe rash involving mucus membranes or skin necrosis: No Has [name] had a PCN reaction that required hospitalization Yes  Has [name] had a PCN reaction occurring within the last 10 years: No If all of the above answers are "NO", then may proceed with Cephalosporin use.   [name] Potassium Iodide Itching and Rash    Severe entire body itching and rash  . Rozerem [Ramelteon] Diarrhea    Severe   . Amiodarone Other (See Comments)    diarrhea  . Latex Swelling  . Verapamil Other (See Comments)    Severe fatigue  . Benazepril Hcl Other (See Comments)    ? Possible lowers platelets  . Indomethacin Other (See Comments)    Renal problems   . Minocycline Other (See Comments)    Makes dizzy and felt just lousy.  [name] Pentazocine Lactate Other (See Comments)    Unknown allergic reaction - pt and wife do not recall this  . Poison Ivy Extract [Poison Ivy Extract] Rash and Other (See Comments)    blisters  . Sertraline Hcl Other (See Comments)    Pt and wife do not recall this  . Shellfish Allergy Rash      Social History   Social History  . Marital status: Married    Spouse name: N/A  . Number of children: 4  . Years of education: N/A   Occupational History  . retired Administrator, [employer]     due to coronary disease in 2000  . 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 Smoking status: Former Smoker    Years: 28.00    Types: Pipe, Cigars    Quit date: 12/25/1995  . Smokeless tobacco: Never Used  . Alcohol use Yes     Comment: 3-5 beer or wine per week  . Drug use: No    . Sexual activity: Not on file   Other Topics Concern  . Not on file   Social History Narrative  . No narrative on file      Family History  Problem Relation Age of Onset  . Heart attack Father   . Stroke Father   . Stroke Mother   . Allergies    . Asthma    . Heart disease    . Cancer      Vitals:   09/27/16 1403  BP: 128/70  Pulse: 63  SpO2: 99%  Weight: 232 lb 8 oz (105.5 kg)    PHYSICAL EXAM: General:  Elderly appearing. No respiratory difficulty HEENT: normal Neck: supple. JVP 9 with prominent cv wave. Carotids 2+ bilat; no bruits. No lymphadenopathy or thyromegaly appreciated. Cor: PMI nondisplaced. Irregularly irregular. 2/6 MR 2/6 TR Lungs: CTAB, normal effort Abdomen: Obese, non-tender, +distended.  No bruits or masses. +BS Extremities: no cyanosis, clubbing, rash. No LE edema.. Left leg with trace to no edema left leg. Wound healed.  Neuro: alert & oriented x 3, cranial nerves grossly intact. moves all 4 extremities w/o difficulty. Affect pleasant.   ASSESSMENT & PLAN:  1. Chronic combined CHF : Echo 7/17 LVEF 40-45% RV severely HK. RVSP 77m HG bioprosthetic AV and s/p tricuspid valve repair --Has worsening ascites. Will schedule paracentesis. Agree with cutting back metoprolol. 2. CAD status post CABG 2006 --Cath in 2012 with all grafts patent but severe disease in native vessels in OM and LAD territories 3. Aortic stenosis and tricuspid regurgitation status post bioprosthetic AVR 2012 and tricuspid valve repair in 05/2011 4. Chronic atrial fibrillation - AT/AF burden > 99% by pacemaker interrogation. - Continue Xarelto 5. CKD Stage IV  --Creatinine stable 2.7-3.1 Followed  by Dr. Acie Fredrickson. 6. DM 2  7. Anxiety  8. Cardiac cirrhosis with ascites -intolerant of spiro. Will refer for paracentesis 9. OHS/OSA - On BiPAP  Tierany Appleby,MD 2:09 PM

## 2016-10-03 ENCOUNTER — Ambulatory Visit (HOSPITAL_COMMUNITY)
Admission: RE | Admit: 2016-10-03 | Discharge: 2016-10-03 | Disposition: A | Discharge status: Home / Self Care | Payer: Medicare Other | Source: Ambulatory Visit | Attending: Internal Medicine | Admitting: Internal Medicine

## 2016-10-03 DIAGNOSIS — R188 Other ascites: Secondary | ICD-10-CM | POA: Diagnosis not present

## 2016-10-03 NOTE — Procedures (Signed)
Ultrasound-guided therapeutic paracentesis performed yielding 4 liters of slightly hazy, amber colored fluid. No immediate complications.

## 2016-10-08 ENCOUNTER — Ambulatory Visit: Payer: Medicare Other | Attending: Family Medicine

## 2016-10-08 DIAGNOSIS — M6281 Muscle weakness (generalized): Secondary | ICD-10-CM | POA: Diagnosis present

## 2016-10-08 DIAGNOSIS — R2689 Other abnormalities of gait and mobility: Secondary | ICD-10-CM | POA: Insufficient documentation

## 2016-10-08 NOTE — Patient Instructions (Addendum)
KNEE: Extension, Long Arc Quad (Weight)  Place weight around leg. Raise leg until knee is straight. Hold _5__ seconds. Use ___ lb weight. _10__ reps per set (each leg), 4-5__ sets per day, __7_ days per week  Copyright  VHI. All rights reserved.    Reverse Fly / Shoulder Retraction   Extend both arms in front of body at shoulder height, palms down, holding band. Move arms out to sides, squeeze shoulder blades together. Repeat _10__ times. Do _4-5__ sessions per day.   Copyright  VHI. All rights reserved.     Knee Raise   Lift knee and then lower it. Repeat with other knee. Repeat _10__ times each leg. Do _4-5___ sessions per day.  http://gt2.exer.us/445   Copyright  VHI. All rights reserved.  Toe Up   Gently rise up on toes and back on heels. Repeat _20___ times. Do 4-5____ sessions per day.  [facility] [address], [address] [place], [place] [address] Phone # [phone] Fax [phone]  

## 2016-10-08 NOTE — Therapy (Signed)
[facility] [address], [place], [place], [address] Phone: [phone]   Fax:  [phone]  Physical Therapy Evaluation  Patient Details  Name: [name] GRONAU, [name] MRN: [id number] Date of Birth: 12-Dec-1941 Referring Provider: Orpah Melter, [name]  Encounter Date: 10/08/2016      PT End of Session - 10/08/16 1307    Visit Number 1   Number of Visits 10   Date for PT Re-Evaluation 12/03/16   PT Start Time 1223   PT Stop Time 1304   PT Time Calculation (min) 41 min   Activity Tolerance Patient tolerated treatment well   Behavior During Therapy [facility] for tasks assessed/performed      Past Medical History:  Diagnosis Date  . A-fib ([place])    permanent with tachy-brady syndrome  . Anemia   . Anxiety   . Atrial flutter (HCC)    hx of  . Biceps tendon tear    right  . Bone marrow disease   . Cancer ([place])    basal cell  . CHF (congestive heart failure) ([place])   . Chronic kidney disease (CKD), stage III (moderate)    lov note dr Koleen Nimrod nephrology 09-17-13 on chart  . Complication of anesthesia 11-16-13   required alot of versed per anesthesia with cataract surgery  . Coronary heart disease    stent, CABG, RBBB  . DDD (degenerative disc disease)   . Diabetes ([place])   . Diabetes mellitus   . Fungal toenail infection   . Gout   . Hemorrhagic cystitis 2012  . Hepatitis    mono  . HTN (hypertension)   . Hypercalcemia 2011   due to sarcoidiosis  . Myocardial infarction    1995, 2002, 2006  . OSA (obstructive sleep apnea)    use bipap setting of 10 and 12  . Osteoarthritis   . Pneumonia 1966, 2011   hx of  . Presence of permanent cardiac pacemaker   . Right sciatic nerve pain    for block thursday 01-21-2014, injections  . Sarcoid ([place]) 2011   pulmonary and bone marrow  . Shortness of breath dyspnea   . Synovial cyst of lumbar spine    l3-l4, l4-l5, injections  . Valvular heart disease    aortic  stenosis/regurgitation    Past Surgical History:  Procedure Laterality Date  . BASAL CELL CARCINOMA EXCISION Right 2015   ear  . CARDIOVERSION N/A 12/30/2013   Procedure: CARDIOVERSION;  Surgeon: [name], [name];  Location: [facility] ENDOSCOPY;  Service: Cardiovascular;  Laterality: N/A;  10:49 cardioversion at 120 joules, then 150 joules, to SB  used  Lido 30mg ,  Propofol 160 mcg  . CARDIOVERSION  2003, 2006, 2012, 2013  . cataract surgery Right 2007  . cataract surgery Left 11-16-13  . COLONOSCOPY N/A 01/29/2014   Procedure: COLONOSCOPY;  Surgeon: Winfield Cunas., [name];  Location: [facility] ENDOSCOPY;  Service: Endoscopy;  Laterality: N/A;  amanda//ja  . CORONARY ARTERY BYPASS GRAFT  2006   x 5  . EP IMPLANTABLE DEVICE N/A 05/05/2015   Procedure: Pacemaker Implant;  Surgeon: Thompson Grayer, [name];  Location: [facility] CV LAB;  Service: Cardiovascular;  Laterality: N/A;  . FLEXIBLE SIGMOIDOSCOPY N/A 12/07/2015   Procedure: FLEXIBLE SIGMOIDOSCOPY;  Surgeon: Wilford Corner, [name];  Location: [facility] ENDOSCOPY;  Service: Endoscopy;  Laterality: N/A;  Unprepped  . INSERT / REPLACE / REMOVE PACEMAKER  05/05/2015  . PORTACATH PLACEMENT    . portacath removed    .  Redo Median sternotomy, extracorporeal cirulation, AVR, Tricuspid valve repair  06/13/2011   AVR(23-mm Edwards pericardial Magna-Ease valve./ TVrepair (34-mm Edwards MC3 annuloplasty ring  . TOTAL KNEE ARTHROPLASTY Right 05/24/2014   Procedure: RIGHT TOTAL KNEE ARTHROPLASTY;  Surgeon: Gearlean Alf, [name];  Location: [facility] ORS;  Service: Orthopedics;  Laterality: Right;  . TRANSURETHRAL RESECTION OF PROSTATE  oct. 2012   TURP  . VASECTOMY  1977    There were no vitals filed for this visit.       Subjective Assessment - 10/08/16 1227    Subjective Pt reports generalized weakness and balance deficits expecially on unlevel surfaces or steps.  Pt had a fall when getting out of the swimming pool in July and had a major wound on the Lt LE that he had to have  treated.  Pt reports that his strength worsened due to immobility after this injury.     Pertinent History Lt leg injury/wound sustained while getting out of the swimming pool 06/24/16, Congestive heart failure limits endurance    Limitations Standing;Walking   How long can you stand comfortably? 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 100 feet at a slow pace   Diagnostic tests bone denisty: normal   Patient Stated Goals improve strength, improve balance   Currently in Pain? No/denies            [facility] PT Assessment - 10/08/16 0001      Assessment   Medical Diagnosis arm weakness, generalized muscle weakness, balance deficits   Referring Provider Orpah Melter, [name]   Onset Date/Surgical Date 06/24/16   Hand Dominance Right   Next [name] Visit 10/23/16     Precautions   Precautions Fall     Restrictions   Weight Bearing Restrictions No     Balance Screen   Has the patient fallen in the past 6 months Yes   How many times? 1  getting out of the pool   Has the patient had a decrease in activity level because of a fear of falling?  No   Is the patient reluctant to leave their home because of a fear of falling?  No     Home Environment   Living Environment Private residence   Living Arrangements Spouse/significant other   Type of [place] to enter   Entrance Stairs-Number of Steps 3   [place] Two level   Alternate Level Stairs-Number of Steps 15     Prior Function   Level of Independence Independent   Vocation Retired   Air traffic controller, swimming      Cognition   Overall Cognitive Status Within Functional Limits for tasks assessed     Observation/Other Assessments   Focus on Therapeutic Outcomes (FOTO)  43% limitation     Posture/Postural Control   Posture/Postural Control Postural limitations   Postural Limitations Forward head;Rounded Shoulders     ROM / Strength    AROM / PROM / Strength AROM;PROM;Strength     AROM   Overall AROM  Deficits   Overall AROM Comments hamstring flexibility limited by 25% bilaterally, Rt shoulder AROM limited by 50% due to functional weakness     PROM   Overall PROM  Deficits   Overall PROM Comments hip flexibility limited by 25-50% bil.       Strength   Overall Strength Deficits   Overall Strength Comments Rt shoulder strength 3 to 4/5 throughout,  Lt UE 4+/5, LE strength DF 4/5 on Lt, 4+/5 on Rt, hips 4/5 throughout, knees 4+/5 throughout     Transfers   Transfers Sit to Stand;Stand to Sit   Sit to Stand 6: Modified independent (Device/Increase time);With upper extremity assist   Stand to Sit 6: Modified independent (Device/Increase time);With upper extremity assist     Ambulation/Gait   Ambulation/Gait Yes   Ambulation Distance (Feet) 100 Feet   Gait Pattern Step-through pattern;Decreased arm swing - right;Decreased step length - left;Decreased step length - right;Decreased dorsiflexion - right;Decreased dorsiflexion - left     Standardized Balance Assessment   Standardized Balance Assessment Timed Up and Go Test;Five Times Sit to Stand   Five times sit to stand comments  17 seconds     Timed Up and Go Test   TUG Normal TUG   Normal TUG (seconds) 17                           PT Education - 10/08/16 1253    Education provided Yes   Education Details seated strength for legs and arms   Person(s) Educated Patient   Methods Explanation;Demonstration;Handout   Comprehension Verbalized understanding;Returned demonstration          PT Short Term Goals - 10/08/16 1313      PT SHORT TERM GOAL #1   Title be independent in initial HEP   Time 4   Period Weeks   Status New     PT SHORT TERM GOAL #2   Title perform TUG in < or = to 14 seconds to reduce falls risk   Time 4   Period Weeks   Status New     PT SHORT TERM GOAL #3   Title improve LE strength to perform sit to stand with  minimal UE support   Time 4   Period Weeks   Status New     PT SHORT TERM GOAL #4   Title report 25% increased ease with negotiating steps at home   Time 4   Period Weeks   Status New           PT Long Term Goals - 10/08/16 1222      PT LONG TERM GOAL #1   Title be independent in advanced HEP   Time 8   Period Weeks   Status New     PT LONG TERM GOAL #2   Title reduce FOTO to < or = to 31% limitation   Time 8   Period Weeks   Status New     PT LONG TERM GOAL #3   Title perform TUG in < or = to 13 seconds to reduce falls risk   Time 8   Period Weeks   Status New     PT LONG TERM GOAL #4   Title improve LE strength to perform 5x sit to stand in < or = to 13 seconds to reduce falls risk   Time 8   Period Weeks   Status New     PT LONG TERM GOAL #5   Title report 50% increased ease with negotiating steps   Time 8   Period Weeks   Status New               Plan - 10/08/16 1308    Clinical Impression Statement Pt presents to PT with widespread weakness and endurance deficits. Pt has a wound on his Lt LE sustained in  July 2017 followed by a period of immobility resuling in weakness.  Pt demonstrates instability with gait, weakness in UEs and LEs, 5x sit to stand is 17 seconds and TUG is 17 seconds.  Pt requires moderate UE support with sit to stand.  Pt with complex medical history including congestive heart failure, knee replacement and Rt shoulder dislocation with chronic weakness that will impact rehab making this a moderate complexity evaluation.  Pt will benefit from skilled PT for strength, endurance and balance training to improve safety at home and in the community.     Rehab Potential Good   PT Frequency 2x / week   PT Duration 8 weeks   PT Treatment/Interventions ADLs/Self Care Home Management;Cryotherapy;Electrical Stimulation;Functional mobility training;Stair training;Gait training;Moist Heat;Therapeutic activities;Therapeutic exercise;Balance  training;Neuromuscular re-education;Patient/family education;Passive range of motion;Manual techniques;Dry needling;Energy conservation   PT Next Visit Plan hamstring flexibility, LE and UE strength, balance and gait training   Consulted and Agree with Plan of Care Patient      Patient will benefit from skilled therapeutic intervention in order to improve the following deficits and impairments:  Postural dysfunction, Decreased strength, Decreased mobility, Decreased balance, Impaired flexibility, Pain, Decreased activity tolerance, Decreased endurance, Cardiopulmonary status limiting activity, Abnormal gait, Difficulty walking  Visit Diagnosis: Muscle weakness (generalized) - Plan: PT plan of care cert/re-cert  Other abnormalities of gait and mobility - Plan: PT plan of care cert/re-cert      G-Codes - [id number] 19-Apr-1221    Functional Assessment Tool Used FOTO: 43% limitation   Functional Limitation Carrying, moving and handling objects   Carrying, Moving and Handling Objects Current Status [id number]) At least 40 percent but less than 60 percent impaired, limited or restricted   Carrying, Moving and Handling Objects Goal Status [id number]) At least 20 percent but less than 40 percent impaired, limited or restricted       Problem List Patient Active Problem List   Diagnosis Date Noted  . Chronic systolic CHF (congestive heart failure) ([place]) 09/26/2016  . Right heart failure 06/26/2016  . Leg wound, left 06/26/2016  . Chronic diastolic CHF (congestive heart failure) ([place]) 03/22/2016  . Ascites 12/02/2015  . Diarrhea 12/02/2015  . Leg cramps 12/02/2015  . Acute on chronic diastolic CHF (congestive heart failure), NYHA class 1 ([place])   . Diastolic dysfunction [id number]  . Dyspnea   . CHF (congestive heart failure) ([place]) 09/06/2015  . CAD (coronary artery disease) 05/11/2015  . S/P placement of cardiac pacemaker 05/10/2015  . Acute on chronic diastolic CHF (congestive heart failure) ([place])  05/10/2015  . Cardiac device in situ   . Sick sinus syndrome ([place]) 05/05/2015  . Acute on chronic renal failure ([place]) 03/23/2015  . Bradycardia, sinus, persistent, severe 03/23/2015  . Acute hyponatremia 03/23/2015  . Hyperkalemia 03/23/2015  . Rib pain on right side 03/23/2015  . Poor appetite 03/23/2015  . Acute gastrointestinal bleeding 03/23/2015  . Nausea & vomiting 03/23/2015  . H/O aortic valve replacement with tissue graft 12/09/2014  . Acute posthemorrhagic anemia 06/01/2014  . UTI (urinary tract infection) 05/31/2014  . Anxiety 05/31/2014  . Insomnia 05/31/2014  . Weakness 05/26/2014  . Fever 05/26/2014  . OA (osteoarthritis) of knee 05/24/2014  . Cataract 09/02/2013  . Osteoarthritis 09/02/2013  . Atrial flutter ([place]) 08/20/2012  . Back pain 08/20/2012  . Severe tricuspid regurgitation by prior echocardiogram 08/14/2011  . Aortic valve replaced 08/14/2011  . Chronic atrial fibrillation ([place]) 07/05/2011  . Chronic kidney disease (CKD), stage III (moderate)   . Hx  of CABG 04/26/2011  . Diabetes ([place]) 04/26/2011  . Gout 04/26/2011  . BPH (benign prostatic hyperplasia) 04/26/2011  . Essential hypertension 04/26/2011  . MYOCARDIAL INFARCTION 11/27/2010  . Ischemic heart disease 11/27/2010  . Valvular heart disease 11/27/2010  . Sarcoidosis ([place]) 11/24/2010  . Obstructive sleep apnea 11/24/2010  . Aortic valve disorder 11/24/2010  . DYSPNEA 11/24/2010     Sigurd Sos, PT 10/08/16 1:18 PM  [facility] [address], [place], [place], [address] Phone: [phone]   Fax:  [phone]  Name: [name] ENGQUIST, [name] MRN: [id number] Date of Birth: 08-21-1941

## 2016-10-10 ENCOUNTER — Ambulatory Visit: Payer: Medicare Other | Admitting: Physical Therapy

## 2016-10-10 ENCOUNTER — Encounter: Payer: Self-pay | Admitting: Physical Therapy

## 2016-10-10 DIAGNOSIS — M6281 Muscle weakness (generalized): Secondary | ICD-10-CM

## 2016-10-10 DIAGNOSIS — R2689 Other abnormalities of gait and mobility: Secondary | ICD-10-CM

## 2016-10-10 NOTE — Therapy (Signed)
[facility] [address] [place], [place], [address] Phone: [phone]   Fax:  [phone]  Physical Therapy Treatment  Patient Details  Name: [name] OPDYKE, MD MRN: [id number] Date of Birth: Sep 12, 1941 Referring Provider: Orpah Melter, MD  Encounter Date: 10/10/2016      PT End of Session - 10/10/16 1223    Visit Number 2   Number of Visits 10   Date for PT Re-Evaluation 12/03/16   PT Start Time 1143   PT Stop Time 1225   PT Time Calculation (min) 42 min   Activity Tolerance Patient tolerated treatment well   Behavior During Therapy [facility] for tasks assessed/performed      Past Medical History:  Diagnosis Date  . A-fib ([place])    permanent with tachy-brady syndrome  . Anemia   . Anxiety   . Atrial flutter (HCC)    hx of  . Biceps tendon tear    right  . Bone marrow disease   . Cancer ([place])    basal cell  . CHF (congestive heart failure) ([place])   . Chronic kidney disease (CKD), stage III (moderate)    lov note dr Koleen Nimrod nephrology 09-17-13 on chart  . Complication of anesthesia 11-16-13   required alot of versed per anesthesia with cataract surgery  . Coronary heart disease    stent, CABG, RBBB  . DDD (degenerative disc disease)   . Diabetes ([place])   . Diabetes mellitus   . Fungal toenail infection   . Gout   . Hemorrhagic cystitis 2012  . Hepatitis    mono  . HTN (hypertension)   . Hypercalcemia 2011   due to sarcoidiosis  . Myocardial infarction    1995, 2002, 2006  . OSA (obstructive sleep apnea)    use bipap setting of 10 and 12  . Osteoarthritis   . Pneumonia 1966, 2011   hx of  . Presence of permanent cardiac pacemaker   . Right sciatic nerve pain    for block thursday 01-21-2014, injections  . Sarcoid ([place]) 2011   pulmonary and bone marrow  . Shortness of breath dyspnea   . Synovial cyst of lumbar spine    l3-l4, l4-l5, injections  . Valvular heart disease    aortic  stenosis/regurgitation    Past Surgical History:  Procedure Laterality Date  . BASAL CELL CARCINOMA EXCISION Right 2015   ear  . CARDIOVERSION N/A 12/30/2013   Procedure: CARDIOVERSION;  Surgeon: [name], MD;  Location: [facility] ENDOSCOPY;  Service: Cardiovascular;  Laterality: N/A;  10:49 cardioversion at 120 joules, then 150 joules, to SB  used  Lido 30mg ,  Propofol 160 mcg  . CARDIOVERSION  2003, 2006, 2012, 2013  . cataract surgery Right 2007  . cataract surgery Left 11-16-13  . COLONOSCOPY N/A 01/29/2014   Procedure: COLONOSCOPY;  Surgeon: Winfield Cunas., MD;  Location: [facility]OS[facility];  Service: Endoscopy;  Laterality: N/A;  amanda//ja  . CORONARY ARTERY BYPASS GRAFT  2006   x 5  . EP IMPLANTABLE DEVICE N/A 05/05/2015   Procedure: Pacemaker Implant;  Surgeon: Thompson Grayer, MD;  Location: [facility] CV LAB;  Service: Cardiovascular;  Laterality: N/A;  . FLEXIBLE SIGMOIDOSCOPY N/A 12/07/2015   Procedure: FLEXIBLE SIGMOIDOSCOPY;  Surgeon: Wilford Corner, MD;  Location: [facility] ENDOSCOPY;  Service: Endoscopy;  Laterality: N/A;  Unprepped  . INSERT / REPLACE / REMOVE PACEMAKER  05/05/2015  . PORTACATH PLACEMENT    . portacath removed    .  Redo Median sternotomy, extracorporeal cirulation, AVR, Tricuspid valve repair  06/13/2011   AVR(23-mm Edwards pericardial Magna-Ease valve./ TVrepair (34-mm Edwards MC3 annuloplasty ring  . TOTAL KNEE ARTHROPLASTY Right 05/24/2014   Procedure: RIGHT TOTAL KNEE ARTHROPLASTY;  Surgeon: Gearlean Alf, MD;  Location: [facility];  Service: Orthopedics;  Laterality: Right;  . TRANSURETHRAL RESECTION OF PROSTATE  oct. 2012   TURP  . VASECTOMY  1977    There were no vitals filed for this visit.      Subjective Assessment - 10/10/16 1148    Subjective I went to barber shop practice last night and did a lot of up & down.    Currently in Pain? Yes   Pain Score 4    Pain Location Knee   Pain Orientation Right;Left   Aggravating Factors  Fairly constant    Pain Relieving Factors Meds   Multiple Pain Sites No                         [facility] Adult PT Treatment/Exercise - 10/10/16 0001      Knee/Hip Exercises: Aerobic   Nustep L1 x 6 min     Knee/Hip Exercises: Standing   Heel Raises Both;2 sets;10 reps   Hip Abduction AROM;Stengthening;Both;2 sets;10 reps   Hip Extension AROM;Stengthening;Both;2 sets;10 reps   Forward Step Up Both;2 sets;5 reps;Hand Hold: 2;Step Height: 6"   Forward Step Up Limitations At the stairs   Rebounder weight shifting 3 ways 1 min each     Knee/Hip Exercises: Seated   Long Arc Quad AROM;Strengthening;Both;1 set;10 reps  Ready for wts next visit.   Clamshell with TheraBand Red  2 x15   Marching Strengthening;Both;2 sets;10 reps  2#                  PT Short Term Goals - 10/08/16 1313      PT SHORT TERM GOAL #1   Title be independent in initial HEP   Time 4   Period Weeks   Status New     PT SHORT TERM GOAL #2   Title perform TUG in < or = to 14 seconds to reduce falls risk   Time 4   Period Weeks   Status New     PT SHORT TERM GOAL #3   Title improve LE strength to perform sit to stand with minimal UE support   Time 4   Period Weeks   Status New     PT SHORT TERM GOAL #4   Title report 25% increased ease with negotiating steps at home   Time 4   Period Weeks   Status New           PT Long Term Goals - 10/08/16 1222      PT LONG TERM GOAL #1   Title be independent in advanced HEP   Time 8   Period Weeks   Status New     PT LONG TERM GOAL #2   Title reduce FOTO to < or = to 31% limitation   Time 8   Period Weeks   Status New     PT LONG TERM GOAL #3   Title perform TUG in < or = to 13 seconds to reduce falls risk   Time 8   Period Weeks   Status New     PT LONG TERM GOAL #4   Title improve LE strength to perform 5x sit to stand in < or =  to 13 seconds to reduce falls risk   Time 8   Period Weeks   Status New     PT LONG TERM GOAL #5    Title report 50% increased ease with negotiating steps   Time 8   Period Weeks   Status New               Plan - 10/10/16 1227    Clinical Impression Statement Pt tolerated all sitting and standing exercises without increasing pain. RT knee had difficulty with full knee extension when doing the step ups. Pt was monitored throughout the entire session.    Rehab Potential Good   PT Frequency 2x / week   PT Duration 8 weeks   PT Treatment/Interventions ADLs/Self Care Home Management;Cryotherapy;Electrical Stimulation;Functional mobility training;Stair training;Gait training;Moist Heat;Therapeutic activities;Therapeutic exercise;Balance training;Neuromuscular re-education;Patient/family education;Passive range of motion;Manual techniques;Dry needling;Energy conservation   Consulted and Agree with Plan of Care Patient      Patient will benefit from skilled therapeutic intervention in order to improve the following deficits and impairments:  Postural dysfunction, Decreased strength, Decreased mobility, Decreased balance, Impaired flexibility, Pain, Decreased activity tolerance, Decreased endurance, Cardiopulmonary status limiting activity, Abnormal gait, Difficulty walking  Visit Diagnosis: Muscle weakness (generalized)  Other abnormalities of gait and mobility     Problem List Patient Active Problem List   Diagnosis Date Noted  . Chronic systolic CHF (congestive heart failure) ([place]) 09/26/2016  . Right heart failure 06/26/2016  . Leg wound, left 06/26/2016  . Chronic diastolic CHF (congestive heart failure) ([place]) 03/22/2016  . Ascites 12/02/2015  . Diarrhea 12/02/2015  . Leg cramps 12/02/2015  . Acute on chronic diastolic CHF (congestive heart failure), NYHA class 1 ([place])   . Diastolic dysfunction 123456  . Dyspnea   . CHF (congestive heart failure) ([place]) 09/06/2015  . CAD (coronary artery disease) 05/11/2015  . S/P placement of cardiac pacemaker 05/10/2015  . Acute  on chronic diastolic CHF (congestive heart failure) ([place]) 05/10/2015  . Cardiac device in situ   . Sick sinus syndrome ([place]) 05/05/2015  . Acute on chronic renal failure ([place]) 03/23/2015  . Bradycardia, sinus, persistent, severe 03/23/2015  . Acute hyponatremia 03/23/2015  . Hyperkalemia 03/23/2015  . Rib pain on right side 03/23/2015  . Poor appetite 03/23/2015  . Acute gastrointestinal bleeding 03/23/2015  . Nausea & vomiting 03/23/2015  . H/O aortic valve replacement with tissue graft 12/09/2014  . Acute posthemorrhagic anemia 06/01/2014  . UTI (urinary tract infection) 05/31/2014  . Anxiety 05/31/2014  . Insomnia 05/31/2014  . Weakness 05/26/2014  . Fever 05/26/2014  . OA (osteoarthritis) of knee 05/24/2014  . Cataract 09/02/2013  . Osteoarthritis 09/02/2013  . Atrial flutter ([place]) 08/20/2012  . Back pain 08/20/2012  . Severe tricuspid regurgitation by prior echocardiogram 08/14/2011  . Aortic valve replaced 08/14/2011  . Chronic atrial fibrillation ([place]) 07/05/2011  . Chronic kidney disease (CKD), stage III (moderate)   . Hx of CABG 04/26/2011  . Diabetes ([place]) 04/26/2011  . Gout 04/26/2011  . BPH (benign prostatic hyperplasia) 04/26/2011  . Essential hypertension 04/26/2011  . MYOCARDIAL INFARCTION 11/27/2010  . Ischemic heart disease 11/27/2010  . Valvular heart disease 11/27/2010  . Sarcoidosis ([place]) 11/24/2010  . Obstructive sleep apnea 11/24/2010  . Aortic valve disorder 11/24/2010  . DYSPNEA 11/24/2010    Dailyn Kempner, PTA 10/10/2016, 12:29 PM  [facility] [address], [place], [place], [address] Phone: [phone]   Fax:  [phone]  Name: [name] SCHRADER  II, MD MRN: [id number] Date of Birth: [birth year]

## 2016-10-17 ENCOUNTER — Encounter: Payer: Medicare Other | Admitting: Physical Therapy

## 2016-10-19 ENCOUNTER — Encounter: Payer: Medicare Other | Admitting: Physical Therapy

## 2016-10-22 ENCOUNTER — Encounter: Payer: Self-pay | Admitting: Physical Therapy

## 2016-10-22 ENCOUNTER — Ambulatory Visit: Payer: Medicare Other | Admitting: Physical Therapy

## 2016-10-22 DIAGNOSIS — M6281 Muscle weakness (generalized): Secondary | ICD-10-CM

## 2016-10-22 DIAGNOSIS — R2689 Other abnormalities of gait and mobility: Secondary | ICD-10-CM

## 2016-10-22 NOTE — Therapy (Signed)
[facility] [address], [place], [place], [address] Phone: [phone]   Fax:  [phone]  Physical Therapy Treatment  Patient Details  Name: [name] CONNALLY, MD MRN: [id number] Date of Birth: 12-03-1941 Referring Provider: Orpah Melter, MD  Encounter Date: 10/22/2016      PT End of Session - 10/22/16 1155    Visit Number 3   Number of Visits 10   Date for PT Re-Evaluation 12/03/16   PT Start Time [id number]   PT Stop Time 1228   PT Time Calculation (min) 40 min   Activity Tolerance Patient tolerated treatment well   Behavior During Therapy [facility] for tasks assessed/performed      Past Medical History:  Diagnosis Date  . A-fib ([place])    permanent with tachy-brady syndrome  . Anemia   . Anxiety   . Atrial flutter (HCC)    hx of  . Biceps tendon tear    right  . Bone marrow disease   . Cancer ([place])    basal cell  . CHF (congestive heart failure) ([place])   . Chronic kidney disease (CKD), stage III (moderate)    lov note dr Koleen Nimrod nephrology 09-17-13 on chart  . Complication of anesthesia 11-16-13   required alot of versed per anesthesia with cataract surgery  . Coronary heart disease    stent, CABG, RBBB  . DDD (degenerative disc disease)   . Diabetes ([place])   . Diabetes mellitus   . Fungal toenail infection   . Gout   . Hemorrhagic cystitis 2012  . Hepatitis    mono  . HTN (hypertension)   . Hypercalcemia 2011   due to sarcoidiosis  . Myocardial infarction    1995, 2002, 2006  . OSA (obstructive sleep apnea)    use bipap setting of 10 and 12  . Osteoarthritis   . Pneumonia 1966, 2011   hx of  . Presence of permanent cardiac pacemaker   . Right sciatic nerve pain    for block thursday 01-21-2014, injections  . Sarcoid ([place]) 2011   pulmonary and bone marrow  . Shortness of breath dyspnea   . Synovial cyst of lumbar spine    l3-l4, l4-l5, injections  . Valvular heart disease    aortic  stenosis/regurgitation    Past Surgical History:  Procedure Laterality Date  . BASAL CELL CARCINOMA EXCISION Right 2015   ear  . CARDIOVERSION N/A 12/30/2013   Procedure: CARDIOVERSION;  Surgeon: [name], MD;  Location: [facility] ENDOSCOPY;  Service: Cardiovascular;  Laterality: N/A;  10:49 cardioversion at 120 joules, then 150 joules, to SB  used  Lido 30mg ,  Propofol 160 mcg  . CARDIOVERSION  2003, 2006, 2012, 2013  . cataract surgery Right 2007  . cataract surgery Left 11-16-13  . COLONOSCOPY N/A 01/29/2014   Procedure: COLONOSCOPY;  Surgeon: Winfield Cunas., MD;  Location: [facility] ENDOSCOPY;  Service: Endoscopy;  Laterality: N/A;  amanda//ja  . CORONARY ARTERY BYPASS GRAFT  2006   x 5  . EP IMPLANTABLE DEVICE N/A 05/05/2015   Procedure: Pacemaker Implant;  Surgeon: Thompson Grayer, MD;  Location: [facility] CV LAB;  Service: Cardiovascular;  Laterality: N/A;  . FLEXIBLE SIGMOIDOSCOPY N/A 12/07/2015   Procedure: FLEXIBLE SIGMOIDOSCOPY;  Surgeon: Wilford Corner, MD;  Location: [facility] ENDOSCOPY;  Service: Endoscopy;  Laterality: N/A;  Unprepped  . INSERT / REPLACE / REMOVE PACEMAKER  05/05/2015  . PORTACATH PLACEMENT    . portacath removed    .  Redo Median sternotomy, extracorporeal cirulation, AVR, Tricuspid valve repair  06/13/2011   AVR(23-mm Edwards pericardial Magna-Ease valve./ TVrepair (34-mm Edwards MC3 annuloplasty ring  . TOTAL KNEE ARTHROPLASTY Right 05/24/2014   Procedure: RIGHT TOTAL KNEE ARTHROPLASTY;  Surgeon: Gearlean Alf, MD;  Location: [facility] ORS;  Service: Orthopedics;  Laterality: Right;  . TRANSURETHRAL RESECTION OF PROSTATE  oct. 2012   TURP  . VASECTOMY  1977    There were no vitals filed for this visit.      Subjective Assessment - 10/22/16 1153    Subjective Pt reports feeling ok today, had some increased knee pain after last session lsating a few days.    Pertinent History Lt leg injury/wound sustained while getting out of the swimming pool 06/24/16, Congestive  heart failure limits endurance    Limitations Standing;Walking   How long can you stand comfortably? 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 100 feet at a slow pace   Diagnostic tests bone denisty: normal   Patient Stated Goals improve strength, improve balance   Currently in Pain? Yes   Pain Score 3    Pain Location Knee   Pain Orientation Right;Left                         [facility] Adult PT Treatment/Exercise - 10/22/16 0001      Knee/Hip Exercises: Stretches   Active Hamstring Stretch Both;2 reps;10 seconds     Knee/Hip Exercises: Aerobic   Stationary Bike L1 x 7 minutes     Knee/Hip Exercises: Standing   Heel Raises Both;2 sets;10 reps   Hip Abduction AROM;Stengthening;Both;2 sets;10 reps   Hip Extension AROM;Stengthening;Both;2 sets;10 reps   Forward Step Up Both;2 sets;5 reps;Hand Hold: 2;Step Height: 6"   Forward Step Up Limitations At the stairs   Rebounder weight shifting 3 ways 1 min each     Knee/Hip Exercises: Seated   Long Arc Quad AROM;Strengthening;Both;1 set;10 reps  Ready for wts next visit.   Clamshell with TheraBand Red  2 x15   Marching Strengthening;Both;2 sets;10 reps  2#                  PT Short Term Goals - 10/22/16 1157      PT SHORT TERM GOAL #1   Title be independent in initial HEP   Time 4   Period Weeks   Status On-going     PT SHORT TERM GOAL #2   Title perform TUG in < or = to 14 seconds to reduce falls risk   Time 4   Period Weeks   Status On-going     PT SHORT TERM GOAL #3   Title improve LE strength to perform sit to stand with minimal UE support   Time 4   Period Weeks   Status On-going     PT SHORT TERM GOAL #4   Title report 25% increased ease with negotiating steps at home   Time 4   Period Weeks   Status On-going           PT Long Term Goals - 10/22/16 1158      PT LONG TERM GOAL #1   Title be independent in advanced HEP    Time 8   Period Weeks   Status On-going     PT LONG TERM GOAL #2   Title reduce FOTO to < or = to 31% limitation  Time 8   Period Weeks   Status On-going     PT LONG TERM GOAL #3   Title perform TUG in < or = to 13 seconds to reduce falls risk   Time 8   Period Weeks   Status On-going     PT LONG TERM GOAL #4   Title improve LE strength to perform 5x sit to stand in < or = to 13 seconds to reduce falls risk   Time 8   Period Weeks   Status On-going     PT LONG TERM GOAL #5   Title report 50% increased ease with negotiating steps   Time 8   Period Weeks   Status On-going               Plan - 10/22/16 1235    Clinical Impression Statement Pt continues to have difficulty with steps ups using a lot of UE to compensate for knee extension. Able to complete all exercises well with no increase in pain. Pt able to complete all exercises with no increase in pain. Pt will continue to beenfit from skilled therapy for strengthening and stabilization.    Rehab Potential Good   PT Frequency 2x / week   PT Duration 8 weeks   PT Treatment/Interventions ADLs/Self Care Home Management;Cryotherapy;Electrical Stimulation;Functional mobility training;Stair training;Gait training;Moist Heat;Therapeutic activities;Therapeutic exercise;Balance training;Neuromuscular re-education;Patient/family education;Passive range of motion;Manual techniques;Dry needling;Energy conservation   PT Next Visit Plan hamstring flexibility, LE and UE strength, balance and gait training   Consulted and Agree with Plan of Care Patient      Patient will benefit from skilled therapeutic intervention in order to improve the following deficits and impairments:  Postural dysfunction, Decreased strength, Decreased mobility, Decreased balance, Impaired flexibility, Pain, Decreased activity tolerance, Decreased endurance, Cardiopulmonary status limiting activity, Abnormal gait, Difficulty walking  Visit  Diagnosis: Muscle weakness (generalized)  Other abnormalities of gait and mobility     Problem List Patient Active Problem List   Diagnosis Date Noted  . Chronic systolic CHF (congestive heart failure) ([place]) 09/26/2016  . Right heart failure 06/26/2016  . Leg wound, left 06/26/2016  . Chronic diastolic CHF (congestive heart failure) ([place]) 03/22/2016  . Ascites 12/02/2015  . Diarrhea 12/02/2015  . Leg cramps 12/02/2015  . Acute on chronic diastolic CHF (congestive heart failure), NYHA class 1 ([place])   . Diastolic dysfunction [id number]  . Dyspnea   . CHF (congestive heart failure) ([place]) 09/06/2015  . CAD (coronary artery disease) 05/11/2015  . S/P placement of cardiac pacemaker 05/10/2015  . Acute on chronic diastolic CHF (congestive heart failure) ([place]) 05/10/2015  . Cardiac device in situ   . Sick sinus syndrome ([place]) 05/05/2015  . Acute on chronic renal failure ([place]) 03/23/2015  . Bradycardia, sinus, persistent, severe 03/23/2015  . Acute hyponatremia 03/23/2015  . Hyperkalemia 03/23/2015  . Rib pain on right side 03/23/2015  . Poor appetite 03/23/2015  . Acute gastrointestinal bleeding 03/23/2015  . Nausea & vomiting 03/23/2015  . H/O aortic valve replacement with tissue graft 12/09/2014  . Acute posthemorrhagic anemia 06/01/2014  . UTI (urinary tract infection) 05/31/2014  . Anxiety 05/31/2014  . Insomnia 05/31/2014  . Weakness 05/26/2014  . Fever 05/26/2014  . OA (osteoarthritis) of knee 05/24/2014  . Cataract 09/02/2013  . Osteoarthritis 09/02/2013  . Atrial flutter ([place]) 08/20/2012  . Back pain 08/20/2012  . Severe tricuspid regurgitation by prior echocardiogram 08/14/2011  . Aortic valve replaced 08/14/2011  . Chronic atrial fibrillation ([place]) 07/05/2011  .  Chronic kidney disease (CKD), stage III (moderate)   . Hx of CABG 04/26/2011  . Diabetes ([place]) 04/26/2011  . Gout 04/26/2011  . BPH (benign prostatic hyperplasia) 04/26/2011  . Essential hypertension  04/26/2011  . MYOCARDIAL INFARCTION 11/27/2010  . Ischemic heart disease 11/27/2010  . Valvular heart disease 11/27/2010  . Sarcoidosis ([place]) 11/24/2010  . Obstructive sleep apnea 11/24/2010  . Aortic valve disorder 11/24/2010  . DYSPNEA 11/24/2010    Mikle Bosworth PTA 10/22/2016, 1:54 PM  [facility] [address], [place], [place], [address] Phone: [phone]   Fax:  [phone]  Name: YUUKI SMIGIELSKI, MD MRN: [id number] Date of Birth: [birth year]

## 2016-10-24 ENCOUNTER — Encounter: Payer: Self-pay | Admitting: Physical Therapy

## 2016-10-24 ENCOUNTER — Ambulatory Visit: Payer: Medicare Other | Attending: Family Medicine | Admitting: Physical Therapy

## 2016-10-24 DIAGNOSIS — R2689 Other abnormalities of gait and mobility: Secondary | ICD-10-CM | POA: Diagnosis present

## 2016-10-24 DIAGNOSIS — M6281 Muscle weakness (generalized): Secondary | ICD-10-CM | POA: Diagnosis not present

## 2016-10-24 NOTE — Therapy (Signed)
[facility] [address] [place], [place], [address] Phone: [phone]   Fax:  [phone]  Physical Therapy Treatment  Patient Details  Name: [name] BANO, [name] MRN: [id number] Date of Birth: 1941/11/05 Referring Provider: Orpah Melter, [name]  Encounter Date: 10/24/2016      PT End of Session - 10/24/16 1219    Visit Number 4   Number of Visits 10   Date for PT Re-Evaluation 12/03/16   PT Start Time 1217   PT Stop Time 1257   PT Time Calculation (min) 40 min   Activity Tolerance Patient tolerated treatment well   Behavior During Therapy [facility] for tasks assessed/performed      Past Medical History:  Diagnosis Date  . A-fib ([place])    permanent with tachy-brady syndrome  . Anemia   . Anxiety   . Atrial flutter (HCC)    hx of  . Biceps tendon tear    right  . Bone marrow disease   . Cancer ([place])    basal cell  . CHF (congestive heart failure) ([place])   . Chronic kidney disease (CKD), stage III (moderate)    lov note dr Koleen Nimrod nephrology 09-17-13 on chart  . Complication of anesthesia 11-16-13   required alot of versed per anesthesia with cataract surgery  . Coronary heart disease    stent, CABG, RBBB  . DDD (degenerative disc disease)   . Diabetes ([place])   . Diabetes mellitus   . Fungal toenail infection   . Gout   . Hemorrhagic cystitis 2012  . Hepatitis    mono  . HTN (hypertension)   . Hypercalcemia 2011   due to sarcoidiosis  . Myocardial infarction    1995, 2002, 2006  . OSA (obstructive sleep apnea)    use bipap setting of 10 and 12  . Osteoarthritis   . Pneumonia 1966, 2011   hx of  . Presence of permanent cardiac pacemaker   . Right sciatic nerve pain    for block thursday 01-21-2014, injections  . 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 Shortness of breath dyspnea   . Synovial cyst of lumbar spine    l3-l4, l4-l5, injections  . Valvular heart disease    aortic  stenosis/regurgitation    Past Surgical History:  Procedure Laterality Date  . BASAL CELL CARCINOMA EXCISION Right 2015   ear  . CARDIOVERSION N/A 12/30/2013   Procedure: CARDIOVERSION;  Surgeon: [name], [name];  Location: [facility] ENDOSCOPY;  Service: Cardiovascular;  Laterality: N/A;  10:49 cardioversion at 120 joules, then 150 joules, to SB  used  Lido 30mg ,  Propofol 160 mcg  . CARDIOVERSION  2003, 2006, 2012, 2013  . cataract surgery Right 2007  . cataract surgery Left 11-16-13  . COLONOSCOPY N/A 01/29/2014   Procedure: COLONOSCOPY;  Surgeon: Winfield Cunas., [name];  Location: [facility]OS[facility];  Service: Endoscopy;  Laterality: N/A;  amanda//ja  . CORONARY ARTERY BYPASS GRAFT  2006   x 5  . EP IMPLANTABLE DEVICE N/A 05/05/2015   Procedure: Pacemaker Implant;  Surgeon: Thompson Grayer, [name];  Location: [facility] CV LAB;  Service: Cardiovascular;  Laterality: N/A;  . FLEXIBLE SIGMOIDOSCOPY N/A 12/07/2015   Procedure: FLEXIBLE SIGMOIDOSCOPY;  Surgeon: Wilford Corner, [name];  Location: [facility] ENDOSCOPY;  Service: Endoscopy;  Laterality: N/A;  Unprepped  . INSERT / REPLACE / REMOVE PACEMAKER  05/05/2015  . PORTACATH PLACEMENT    . portacath removed    .  Redo Median sternotomy, extracorporeal cirulation, AVR, Tricuspid valve repair  06/13/2011   AVR(23-mm Edwards pericardial Magna-Ease valve./ TVrepair (34-mm Edwards MC3 annuloplasty ring  . TOTAL KNEE ARTHROPLASTY Right 05/24/2014   Procedure: RIGHT TOTAL KNEE ARTHROPLASTY;  Surgeon: Gearlean Alf, [name];  Location: [facility];  Service: Orthopedics;  Laterality: Right;  . TRANSURETHRAL RESECTION OF PROSTATE  oct. 2012   TURP  . VASECTOMY  1977    There were no vitals filed for this visit.      Subjective Assessment - 10/24/16 1219    Subjective Pt reports knee pain today, had chorus rehersal last night with a lot of standing.    Pertinent History Lt leg injury/wound sustained while getting out of the swimming pool 06/24/16, Congestive heart failure  limits endurance    Limitations Standing;Walking   How long can you stand comfortably? 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 100 feet at a slow pace   Diagnostic tests bone denisty: normal   Patient Stated Goals improve strength, improve balance   Currently in Pain? Yes   Pain Score 2    Pain Location Knee   Pain Orientation Right;Left                         [facility] Adult PT Treatment/Exercise - 10/24/16 0001      Knee/Hip Exercises: Stretches   Active Hamstring Stretch Both;2 reps;10 seconds     Knee/Hip Exercises: Aerobic   Stationary Bike L1 x 10 minutes     Knee/Hip Exercises: Standing   Heel Raises Both;2 sets;10 reps   Hip Abduction AROM;Stengthening;Both;2 sets;10 reps   Hip Extension AROM;Stengthening;Both;2 sets;10 reps   Forward Step Up Both;2 sets;5 reps;Hand Hold: 2;Step Height: 6"   Forward Step Up Limitations At the stairs   Rebounder weight shifting 3 ways 1 min each     Knee/Hip Exercises: Seated   Long Arc Quad Strengthening;Both;2 sets;10 reps  #2   Clamshell with TheraBand Red  2 x15   Marching Strengthening;Both;2 sets;10 reps  2#   Sit to [employer] 2 sets;10 reps                  PT Short Term Goals - 10/22/16 1157      PT SHORT TERM GOAL #1   Title be independent in initial HEP   Time 4   Period Weeks   Status On-going     PT SHORT TERM GOAL #2   Title perform TUG in < or = to 14 seconds to reduce falls risk   Time 4   Period Weeks   Status On-going     PT SHORT TERM GOAL #3   Title improve LE strength to perform sit to stand with minimal UE support   Time 4   Period Weeks   Status On-going     PT SHORT TERM GOAL #4   Title report 25% increased ease with negotiating steps at home   Time 4   Period Weeks   Status On-going           PT Long Term Goals - 10/22/16 1158      PT LONG TERM GOAL #1   Title be independent in advanced HEP   Time 8    Period Weeks   Status On-going     PT LONG TERM GOAL #2   Title reduce FOTO to < or = to 31% limitation  Time 8   Period Weeks   Status On-going     PT LONG TERM GOAL #3   Title perform TUG in < or = to 13 seconds to reduce falls risk   Time 8   Period Weeks   Status On-going     PT LONG TERM GOAL #4   Title improve LE strength to perform 5x sit to stand in < or = to 13 seconds to reduce falls risk   Time 8   Period Weeks   Status On-going     PT LONG TERM GOAL #5   Title report 50% increased ease with negotiating steps   Time 8   Period Weeks   Status On-going               Plan - 10/24/16 1239    Clinical Impression Statement Forward steps up looked a little better today with less compensation. Difficulty with sit to stand. [place] to propell self forward and decreased eccentric to sit. Needs verbal cues to stand all the way up. Pt will continue to benefit from skilled therapy for Bil LE strenghtening and flexibility.    Rehab Potential Good   PT Frequency 2x / week   PT Duration 8 weeks   PT Treatment/Interventions ADLs/Self Care Home Management;Cryotherapy;Electrical Stimulation;Functional mobility training;Stair training;Gait training;Moist Heat;Therapeutic activities;Therapeutic exercise;Balance training;Neuromuscular re-education;Patient/family education;Passive range of motion;Manual techniques;Dry needling;Energy conservation   PT Next Visit Plan hamstring flexibility, LE and UE strength, balance and gait training   Consulted and Agree with Plan of Care Patient      Patient will benefit from skilled therapeutic intervention in order to improve the following deficits and impairments:  Postural dysfunction, Decreased strength, Decreased mobility, Decreased balance, Impaired flexibility, Pain, Decreased activity tolerance, Decreased endurance, Cardiopulmonary status limiting activity, Abnormal gait, Difficulty walking  Visit Diagnosis: Muscle weakness  (generalized)  Other abnormalities of gait and mobility     Problem List Patient Active Problem List   Diagnosis Date Noted  . Chronic systolic CHF (congestive heart failure) ([place]) 09/26/2016  . Right heart failure 06/26/2016  . Leg wound, left 06/26/2016  . Chronic diastolic CHF (congestive heart failure) ([place]) 03/22/2016  . Ascites 12/02/2015  . Diarrhea 12/02/2015  . Leg cramps 12/02/2015  . Acute on chronic diastolic CHF (congestive heart failure), NYHA class 1 ([place])   . Diastolic dysfunction 123456  . Dyspnea   . CHF (congestive heart failure) ([place]) 09/06/2015  . CAD (coronary artery disease) 05/11/2015  . S/P placement of cardiac pacemaker 05/10/2015  . Acute on chronic diastolic CHF (congestive heart failure) ([place]) 05/10/2015  . Cardiac device in situ   . Sick sinus syndrome ([place]) 05/05/2015  . Acute on chronic renal failure ([place]) 03/23/2015  . Bradycardia, sinus, persistent, severe 03/23/2015  . Acute hyponatremia 03/23/2015  . Hyperkalemia 03/23/2015  . Rib pain on right side 03/23/2015  . Poor appetite 03/23/2015  . Acute gastrointestinal bleeding 03/23/2015  . Nausea & vomiting 03/23/2015  . H/O aortic valve replacement with tissue graft 12/09/2014  . Acute posthemorrhagic anemia 06/01/2014  . UTI (urinary tract infection) 05/31/2014  . Anxiety 05/31/2014  . Insomnia 05/31/2014  . Weakness 05/26/2014  . Fever 05/26/2014  . OA (osteoarthritis) of knee 05/24/2014  . Cataract 09/02/2013  . Osteoarthritis 09/02/2013  . Atrial flutter ([place]) 08/20/2012  . Back pain 08/20/2012  . Severe tricuspid regurgitation by prior echocardiogram 08/14/2011  . Aortic valve replaced 08/14/2011  . Chronic atrial fibrillation ([place]) 07/05/2011  . Chronic kidney disease (  CKD), stage III (moderate)   . Hx of CABG 04/26/2011  . Diabetes ([place]) 04/26/2011  . Gout 04/26/2011  . BPH (benign prostatic hyperplasia) 04/26/2011  . Essential hypertension 04/26/2011  . MYOCARDIAL  INFARCTION 11/27/2010  . Ischemic heart disease 11/27/2010  . Valvular heart disease 11/27/2010  . Sarcoidosis ([place]) 11/24/2010  . Obstructive sleep apnea 11/24/2010  . Aortic valve disorder 11/24/2010  . DYSPNEA 11/24/2010    Mikle Bosworth PTA 10/24/2016, 12:57 PM  [facility] [address] [place], [place], [address] Phone: [phone]   Fax:  [phone]  Name: [name] EVERHART, [name] MRN: [id number] Date of Birth: Dec 03, 1941

## 2016-10-26 ENCOUNTER — Ambulatory Visit: Payer: Medicare Other | Admitting: Physical Therapy

## 2016-10-31 ENCOUNTER — Encounter: Payer: Self-pay | Admitting: Physical Therapy

## 2016-10-31 ENCOUNTER — Ambulatory Visit: Payer: Medicare Other | Admitting: Physical Therapy

## 2016-10-31 DIAGNOSIS — M6281 Muscle weakness (generalized): Secondary | ICD-10-CM | POA: Diagnosis not present

## 2016-10-31 DIAGNOSIS — R2689 Other abnormalities of gait and mobility: Secondary | ICD-10-CM

## 2016-10-31 NOTE — Therapy (Signed)
[facility] [address] [place], [place], [address] Phone: [phone]   Fax:  [phone]  Physical Therapy Treatment  Patient Details  Name: [name] KHALIL, [name] MRN: [id number] Date of Birth: 07/02/1941 Referring Provider: Orpah Melter, [name]  Encounter Date: 10/31/2016      PT End of Session - 10/31/16 1148    Visit Number 5   Number of Visits 10   Date for PT Re-Evaluation 12/03/16   PT Start Time [id number]   PT Stop Time 1230   PT Time Calculation (min) 49 min   Activity Tolerance Patient tolerated treatment well      Past Medical History:  Diagnosis Date  . A-fib ([place])    permanent with tachy-brady syndrome  . Anemia   . Anxiety   . Atrial flutter (HCC)    hx of  . Biceps tendon tear    right  . Bone marrow disease   . Cancer ([place])    basal cell  . CHF (congestive heart failure) ([place])   . Chronic kidney disease (CKD), stage III (moderate)    lov note dr Koleen Nimrod nephrology 09-17-13 on chart  . Complication of anesthesia 11-16-13   required alot of versed per anesthesia with cataract surgery  . Coronary heart disease    stent, CABG, RBBB  . DDD (degenerative disc disease)   . Diabetes ([place])   . Diabetes mellitus   . Fungal toenail infection   . Gout   . Hemorrhagic cystitis 2012  . Hepatitis    mono  . HTN (hypertension)   . Hypercalcemia 2011   due to sarcoidiosis  . Myocardial infarction    1995, 2002, 2006  . OSA (obstructive sleep apnea)    use bipap setting of 10 and 12  . Osteoarthritis   . Pneumonia 1966, 2011   hx of  . Presence of permanent cardiac pacemaker   . Right sciatic nerve pain    for block thursday 01-21-2014, injections  . Sarcoid ([place]) 2011   pulmonary and bone marrow  . Shortness of breath dyspnea   . Synovial cyst of lumbar spine    l3-l4, l4-l5, injections  . Valvular heart disease    aortic stenosis/regurgitation    Past Surgical History:  Procedure  Laterality Date  . BASAL CELL CARCINOMA EXCISION Right 2015   ear  . CARDIOVERSION N/A 12/30/2013   Procedure: CARDIOVERSION;  Surgeon: [name], [name];  Location: [facility] ENDOSCOPY;  Service: Cardiovascular;  Laterality: N/A;  10:49 cardioversion at 120 joules, then 150 joules, to SB  used  Lido 30mg ,  Propofol 160 mcg  . CARDIOVERSION  2003, 2006, 2012, 2013  . cataract surgery Right 2007  . cataract surgery Left 11-16-13  . COLONOSCOPY N/A 01/29/2014   Procedure: COLONOSCOPY;  Surgeon: Winfield Cunas., [name];  Location: [facility] ENDOSCOPY;  Service: Endoscopy;  Laterality: N/A;  amanda//ja  . CORONARY ARTERY BYPASS GRAFT  2006   x 5  . EP IMPLANTABLE DEVICE N/A 05/05/2015   Procedure: Pacemaker Implant;  Surgeon: Thompson Grayer, [name];  Location: [place] [facility];  Service: Cardiovascular;  Laterality: N/A;  . FLEXIBLE SIGMOIDOSCOPY N/A 12/07/2015   Procedure: FLEXIBLE SIGMOIDOSCOPY;  Surgeon: Wilford Corner, [name];  Location: [facility] ENDOSCOPY;  Service: Endoscopy;  Laterality: N/A;  Unprepped  . INSERT / REPLACE / REMOVE PACEMAKER  05/05/2015  . PORTACATH PLACEMENT    . portacath removed    . Redo Median sternotomy, extracorporeal cirulation, AVR, Tricuspid valve  repair  06/13/2011   AVR(23-mm Edwards pericardial Magna-Ease valve./ TVrepair (34-mm Edwards MC3 annuloplasty ring  . TOTAL KNEE ARTHROPLASTY Right 05/24/2014   Procedure: RIGHT TOTAL KNEE ARTHROPLASTY;  Surgeon: Gearlean Alf, [name];  Location: [facility];  Service: Orthopedics;  Laterality: Right;  . TRANSURETHRAL RESECTION OF PROSTATE  oct. 2012   TURP  . VASECTOMY  1977    There were no vitals filed for this visit.      Subjective Assessment - 10/31/16 1149    Subjective Just recovered from a virus, feeling a tad weak from that.    Currently in Pain? Yes   Pain Score 1    Pain Location Knee   Pain Orientation Right   Pain Descriptors / Indicators Dull   Aggravating Factors  Standing a lot   Pain Relieving Factors sitting some    Multiple Pain Sites No                         [facility] Adult PT Treatment/Exercise - 10/31/16 0001      Knee/Hip Exercises: Standing   Heel Raises Both;2 sets;10 reps  Done on first step   Hip Abduction AROM;Stengthening;Both;2 sets;10 reps   Hip Extension AROM;Stengthening;Both;2 sets;10 reps   Forward Step Up Both;2 sets;5 reps;Hand Hold: 2;Step Height: 6"   Forward Step Up Limitations up & down stairs     Knee/Hip Exercises: Seated   Long Arc Quad Strengthening;Both;2 sets;10 reps  #2   Clamshell with TheraBand Red  2 x15   Marching Strengthening;Both;2 sets;10 reps  2#   Sit to [employer] 2 sets;10 reps                  PT Short Term Goals - 10/31/16 1221      PT SHORT TERM GOAL #1   Title be independent in initial HEP   Time 4   Period Weeks   Status On-going  Not consistent      PT SHORT TERM GOAL #2   Title perform TUG in < or = to 14 seconds to reduce falls risk   Time 4   Period Weeks   Status On-going  will do next visit when pt has a little more energy     PT SHORT TERM GOAL #3   Title improve LE strength to perform sit to stand with minimal UE support   Time 4   Period Weeks   Status Achieved           PT Long Term Goals - 10/22/16 1158      PT LONG TERM GOAL #1   Title be independent in advanced HEP   Time 8   Period Weeks   Status On-going     PT LONG TERM GOAL #2   Title reduce FOTO to < or = to 31% limitation   Time 8   Period Weeks   Status On-going     PT LONG TERM GOAL #3   Title perform TUG in < or = to 13 seconds to reduce falls risk   Time 8   Period Weeks   Status On-going     PT LONG TERM GOAL #4   Title improve LE strength to perform 5x sit to stand in < or = to 13 seconds to reduce falls risk   Time 8   Period Weeks   Status On-going     PT LONG TERM GOAL #5   Title report 50% increased ease  with negotiating steps   Time 8   Period Weeks   Status On-going               Plan -  10/31/16 1148    Clinical Impression Statement Pt is functioning with his ADLs pretty well, he still has difficulty with standing for prolonged time but htis is mainly due to it his LT knee hurting. He admitts to not doing any HEP, only his ADLS. He did meet goal of needed minimal UE assistance for sit to stand.    Rehab Potential Good   PT Frequency 2x / week   PT Duration 8 weeks   PT Treatment/Interventions ADLs/Self Care Home Management;Cryotherapy;Electrical Stimulation;Functional mobility training;Stair training;Gait training;Moist Heat;Therapeutic activities;Therapeutic exercise;Balance training;Neuromuscular re-education;Patient/family education;Passive range of motion;Manual techniques;Dry needling;Energy conservation   PT Next Visit Plan hamstring flexibility, LE and UE strength, balance and gait training   Consulted and Agree with Plan of Care Patient      Patient will benefit from skilled therapeutic intervention in order to improve the following deficits and impairments:  Postural dysfunction, Decreased strength, Decreased mobility, Decreased balance, Impaired flexibility, Pain, Decreased activity tolerance, Decreased endurance, Cardiopulmonary status limiting activity, Abnormal gait, Difficulty walking  Visit Diagnosis: Muscle weakness (generalized)  Other abnormalities of gait and mobility     Problem List Patient Active Problem List   Diagnosis Date Noted  . Chronic systolic CHF (congestive heart failure) ([place]) 09/26/2016  . Right heart failure 06/26/2016  . Leg wound, left 06/26/2016  . Chronic diastolic CHF (congestive heart failure) ([place]) 03/22/2016  . Ascites 12/02/2015  . Diarrhea 12/02/2015  . Leg cramps 12/02/2015  . Acute on chronic diastolic CHF (congestive heart failure), NYHA class 1 ([place])   . Diastolic dysfunction 123456  . Dyspnea   . CHF (congestive heart failure) ([place]) 09/06/2015  . CAD (coronary artery disease) 05/11/2015  . S/P placement of  cardiac pacemaker 05/10/2015  . Acute on chronic diastolic CHF (congestive heart failure) ([place]) 05/10/2015  . Cardiac device in situ   . Sick sinus syndrome ([place]) 05/05/2015  . Acute on chronic renal failure ([place]) 03/23/2015  . Bradycardia, sinus, persistent, severe 03/23/2015  . Acute hyponatremia 03/23/2015  . Hyperkalemia 03/23/2015  . Rib pain on right side 03/23/2015  . Poor appetite 03/23/2015  . Acute gastrointestinal bleeding 03/23/2015  . Nausea & vomiting 03/23/2015  . H/O aortic valve replacement with tissue graft 12/09/2014  . Acute posthemorrhagic anemia 06/01/2014  . UTI (urinary tract infection) 05/31/2014  . Anxiety 05/31/2014  . Insomnia 05/31/2014  . Weakness 05/26/2014  . Fever 05/26/2014  . OA (osteoarthritis) of knee 05/24/2014  . Cataract 09/02/2013  . Osteoarthritis 09/02/2013  . Atrial flutter ([place]) 08/20/2012  . Back pain 08/20/2012  . Severe tricuspid regurgitation by prior echocardiogram 08/14/2011  . Aortic valve replaced 08/14/2011  . Chronic atrial fibrillation ([place]) 07/05/2011  . Chronic kidney disease (CKD), stage III (moderate)   . Hx of CABG 04/26/2011  . Diabetes ([place]) 04/26/2011  . Gout 04/26/2011  . BPH (benign prostatic hyperplasia) 04/26/2011  . Essential hypertension 04/26/2011  . MYOCARDIAL INFARCTION 11/27/2010  . Ischemic heart disease 11/27/2010  . Valvular heart disease 11/27/2010  . Sarcoidosis ([place]) 11/24/2010  . Obstructive sleep apnea 11/24/2010  . Aortic valve disorder 11/24/2010  . DYSPNEA 11/24/2010    Chenille Toor, PTA 10/31/2016, 12:42 PM  [facility] [address] [place], [place], [address] Phone: [phone]   Fax:  [phone]  Name: [name]  Marcelle Smiling, [name] MRN: [id number] Date of Birth: January 23, 1941

## 2016-11-02 ENCOUNTER — Ambulatory Visit: Payer: Medicare Other | Admitting: Physical Therapy

## 2016-11-02 ENCOUNTER — Encounter: Payer: Self-pay | Admitting: Physical Therapy

## 2016-11-02 DIAGNOSIS — M6281 Muscle weakness (generalized): Secondary | ICD-10-CM

## 2016-11-02 DIAGNOSIS — R2689 Other abnormalities of gait and mobility: Secondary | ICD-10-CM

## 2016-11-02 NOTE — Therapy (Signed)
[facility] [address], [place], [place], [address] Phone: [phone]   Fax:  [phone]  Physical Therapy Treatment  Patient Details  Name: [name] PALKO, [name] MRN: [id number] Date of Birth: 1941/03/30 Referring Provider: Orpah Melter, [name]  Encounter Date: 11/02/2016      PT End of Session - 11/02/16 1103    Visit Number 6   Number of Visits 10   Date for PT Re-Evaluation 12/03/16   PT Start Time [id number]   PT Stop Time 1150   PT Time Calculation (min) 48 min   Activity Tolerance Patient tolerated treatment well   Behavior During Therapy [facility] for tasks assessed/performed      Past Medical History:  Diagnosis Date  . A-fib ([place])    permanent with tachy-brady syndrome  . Anemia   . Anxiety   . Atrial flutter (HCC)    hx of  . Biceps tendon tear    right  . Bone marrow disease   . Cancer ([place])    basal cell  . CHF (congestive heart failure) ([place])   . Chronic kidney disease (CKD), stage III (moderate)    lov note dr Koleen Nimrod nephrology 09-17-13 on chart  . Complication of anesthesia 11-16-13   required alot of versed per anesthesia with cataract surgery  . Coronary heart disease    stent, CABG, RBBB  . DDD (degenerative disc disease)   . Diabetes ([place])   . Diabetes mellitus   . Fungal toenail infection   . Gout   . Hemorrhagic cystitis 2012  . Hepatitis    mono  . HTN (hypertension)   . Hypercalcemia 2011   due to sarcoidiosis  . Myocardial infarction    1995, 2002, 2006  . OSA (obstructive sleep apnea)    use bipap setting of 10 and 12  . Osteoarthritis   . Pneumonia 1966, 2011   hx of  . Presence of permanent cardiac pacemaker   . Right sciatic nerve pain    for block thursday 01-21-2014, injections  . Sarcoid ([place]) 2011   pulmonary and bone marrow  . Shortness of breath dyspnea   . Synovial cyst of lumbar spine    l3-l4, l4-l5, injections  . Valvular heart disease    aortic  stenosis/regurgitation    Past Surgical History:  Procedure Laterality Date  . BASAL CELL CARCINOMA EXCISION Right 2015   ear  . CARDIOVERSION N/A 12/30/2013   Procedure: CARDIOVERSION;  Surgeon: [name], [name];  Location: [facility] ENDOSCOPY;  Service: Cardiovascular;  Laterality: N/A;  10:49 cardioversion at 120 joules, then 150 joules, to SB  used  Lido 30mg ,  Propofol 160 mcg  . CARDIOVERSION  2003, 2006, 2012, 2013  . cataract surgery Right 2007  . cataract surgery Left 11-16-13  . COLONOSCOPY N/A 01/29/2014   Procedure: COLONOSCOPY;  Surgeon: Winfield Cunas., [name];  Location: [facility];  Service: Endoscopy;  Laterality: N/A;  amanda//ja  . CORONARY ARTERY BYPASS GRAFT  2006   x 5  . EP IMPLANTABLE DEVICE N/A 05/05/2015   Procedure: Pacemaker Implant;  Surgeon: Thompson Grayer, [name];  Location: [place] [facility];  Service: Cardiovascular;  Laterality: N/A;  . FLEXIBLE SIGMOIDOSCOPY N/A 12/07/2015   Procedure: FLEXIBLE SIGMOIDOSCOPY;  Surgeon: Wilford Corner, [name];  Location: [facility] ENDOSCOPY;  Service: Endoscopy;  Laterality: N/A;  Unprepped  . INSERT / REPLACE / REMOVE PACEMAKER  05/05/2015  . PORTACATH PLACEMENT    . portacath removed    .  Redo Median sternotomy, extracorporeal cirulation, AVR, Tricuspid valve repair  06/13/2011   AVR(23-mm Edwards pericardial Magna-Ease valve./ TVrepair (34-mm Edwards MC3 annuloplasty ring  . TOTAL KNEE ARTHROPLASTY Right 05/24/2014   Procedure: RIGHT TOTAL KNEE ARTHROPLASTY;  Surgeon: Gearlean Alf, [name];  Location: [facility];  Service: Orthopedics;  Laterality: Right;  . TRANSURETHRAL RESECTION OF PROSTATE  oct. 2012   TURP  . VASECTOMY  1977    There were no vitals filed for this visit.      Subjective Assessment - 11/02/16 1104    Subjective Better energy today.    Currently in Pain? Yes   Pain Score 2    Pain Location Knee   Pain Orientation Left   Pain Descriptors / Indicators Dull;Sore   Multiple Pain Sites No                          [facility] Adult PT Treatment/Exercise - 11/02/16 0001      Knee/Hip Exercises: Aerobic   Nustep L1 x 10 min     Knee/Hip Exercises: Standing   Heel Raises Both;2 sets;10 reps  Done on first step   Hip Abduction AROM;Stengthening;Both;2 sets;10 reps   Hip Extension AROM;Stengthening;Both;2 sets;10 reps   Forward Step Up Both;2 sets;5 reps;Hand Hold: 2;Step Height: 6"   Forward Step Up Limitations up & down stairs     Knee/Hip Exercises: Seated   Long Arc Quad Strengthening;Both;2 sets;10 reps  #2   Clamshell with TheraBand Red  2 x15   Marching Strengthening;Both;2 sets;10 reps  2#   Sit to [place] 1 set;10 reps;with UE support  Audible crepitus in LT knee only 1 set                  PT Short Term Goals - 10/31/16 1221      PT SHORT TERM GOAL #1   Title be independent in initial HEP   Time 4   Period Weeks   Status On-going  Not consistent      PT SHORT TERM GOAL #2   Title perform TUG in < or = to 14 seconds to reduce falls risk   Time 4   Period Weeks   Status On-going  will do next visit when pt has a little more energy     PT SHORT TERM GOAL #3   Title improve LE strength to perform sit to stand with minimal UE support   Time 4   Period Weeks   Status Achieved           PT Long Term Goals - 10/22/16 1158      PT LONG TERM GOAL #1   Title be independent in advanced HEP   Time 8   Period Weeks   Status On-going     PT LONG TERM GOAL #2   Title reduce FOTO to < or = to 31% limitation   Time 8   Period Weeks   Status On-going     PT LONG TERM GOAL #3   Title perform TUG in < or = to 13 seconds to reduce falls risk   Time 8   Period Weeks   Status On-going     PT LONG TERM GOAL #4   Title improve LE strength to perform 5x sit to stand in < or = to 13 seconds to reduce falls risk   Time 8   Period Weeks   Status On-going     PT LONG  TERM GOAL #5   Title report 50% increased ease with negotiating  steps   Time 8   Period Weeks   Status On-going               Plan - 11/02/16 1103    Clinical Impression Statement Some knee pain today. Pt had significnat audible crepitus today with open chain exercises, tried not to over do, pt said he is not interested in persuing Lt TKR.  Seemes to be using his LE more consistently when going from sit to stand.    Rehab Potential Good   PT Frequency 2x / week   PT Duration 8 weeks   PT Treatment/Interventions ADLs/Self Care Home Management;Cryotherapy;Electrical Stimulation;Functional mobility training;Stair training;Gait training;Moist Heat;Therapeutic activities;Therapeutic exercise;Balance training;Neuromuscular re-education;Patient/family education;Passive range of motion;Manual techniques;Dry needling;Energy conservation   PT Next Visit Plan  Add wts to standing hip kicks   Consulted and Agree with Plan of Care Patient      Patient will benefit from skilled therapeutic intervention in order to improve the following deficits and impairments:  Postural dysfunction, Decreased strength, Decreased mobility, Decreased balance, Impaired flexibility, Pain, Decreased activity tolerance, Decreased endurance, Cardiopulmonary status limiting activity, Abnormal gait, Difficulty walking  Visit Diagnosis: Muscle weakness (generalized)  Other abnormalities of gait and mobility     Problem List Patient Active Problem List   Diagnosis Date Noted  . Chronic systolic CHF (congestive heart failure) ([place]) 09/26/2016  . Right heart failure 06/26/2016  . Leg wound, left 06/26/2016  . Chronic diastolic CHF (congestive heart failure) ([place]) 03/22/2016  . Ascites 12/02/2015  . Diarrhea 12/02/2015  . Leg cramps 12/02/2015  . Acute on chronic diastolic CHF (congestive heart failure), NYHA class 1 ([place])   . Diastolic dysfunction 123456  . Dyspnea   . CHF (congestive heart failure) ([place]) 09/06/2015  . CAD (coronary artery disease) 05/11/2015  . S/P  placement of cardiac pacemaker 05/10/2015  . Acute on chronic diastolic CHF (congestive heart failure) ([place]) 05/10/2015  . Cardiac device in situ   . Sick sinus syndrome ([place]) 05/05/2015  . Acute on chronic renal failure ([place]) 03/23/2015  . Bradycardia, sinus, persistent, severe 03/23/2015  . Acute hyponatremia 03/23/2015  . Hyperkalemia 03/23/2015  . Rib pain on right side 03/23/2015  . Poor appetite 03/23/2015  . Acute gastrointestinal bleeding 03/23/2015  . Nausea & vomiting 03/23/2015  . H/O aortic valve replacement with tissue graft 12/09/2014  . Acute posthemorrhagic anemia 06/01/2014  . UTI (urinary tract infection) 05/31/2014  . Anxiety 05/31/2014  . Insomnia 05/31/2014  . Weakness 05/26/2014  . Fever 05/26/2014  . OA (osteoarthritis) of knee 05/24/2014  . Cataract 09/02/2013  . Osteoarthritis 09/02/2013  . Atrial flutter ([place]) 08/20/2012  . Back pain 08/20/2012  . Severe tricuspid regurgitation by prior echocardiogram 08/14/2011  . Aortic valve replaced 08/14/2011  . Chronic atrial fibrillation ([place]) 07/05/2011  . Chronic kidney disease (CKD), stage III (moderate)   . Hx of CABG 04/26/2011  . Diabetes ([place]) 04/26/2011  . Gout 04/26/2011  . BPH (benign prostatic hyperplasia) 04/26/2011  . Essential hypertension 04/26/2011  . MYOCARDIAL INFARCTION 11/27/2010  . Ischemic heart disease 11/27/2010  . Valvular heart disease 11/27/2010  . Sarcoidosis ([place]) 11/24/2010  . Obstructive sleep apnea 11/24/2010  . Aortic valve disorder 11/24/2010  . DYSPNEA 11/24/2010    Harald Quevedo, PTA 11/02/2016, 11:52 AM  [facility] [address] [place], [place], [address] Phone: [phone]   Fax:  [phone]  Name: [name]  Marcelle Smiling, [name] MRN: [id number] Date of Birth: 01-12-1941

## 2016-11-07 ENCOUNTER — Encounter: Payer: Medicare Other | Admitting: Physical Therapy

## 2016-11-09 ENCOUNTER — Ambulatory Visit: Payer: Medicare Other | Admitting: Physical Therapy

## 2016-11-09 ENCOUNTER — Encounter: Payer: Self-pay | Admitting: Physical Therapy

## 2016-11-09 DIAGNOSIS — M6281 Muscle weakness (generalized): Secondary | ICD-10-CM

## 2016-11-09 DIAGNOSIS — R2689 Other abnormalities of gait and mobility: Secondary | ICD-10-CM

## 2016-11-09 NOTE — Therapy (Signed)
[facility] [address] [place], [place], [address] Phone: [phone]   Fax:  [phone]  Physical Therapy Treatment  Patient Details  Name: [name] KANHAI, MD MRN: [id number] Date of Birth: 10-23-1941 Referring Provider: Orpah Melter, MD  Encounter Date: 11/09/2016      PT End of Session - 11/09/16 1102    Visit Number 7   Number of Visits 10   Date for PT Re-Evaluation 12/03/16   PT Start Time [id number]   PT Stop Time 1131   PT Time Calculation (min) 38 min   Activity Tolerance Patient tolerated treatment well   Behavior During Therapy [facility] for tasks assessed/performed      Past Medical History:  Diagnosis Date  . A-fib ([place])    permanent with tachy-brady syndrome  . Anemia   . Anxiety   . Atrial flutter (HCC)    hx of  . Biceps tendon tear    right  . Bone marrow disease   . Cancer ([place])    basal cell  . CHF (congestive heart failure) ([place])   . Chronic kidney disease (CKD), stage III (moderate)    lov note dr Koleen Nimrod nephrology 09-17-13 on chart  . Complication of anesthesia 11-16-13   required alot of versed per anesthesia with cataract surgery  . Coronary heart disease    stent, CABG, RBBB  . DDD (degenerative disc disease)   . Diabetes ([place])   . Diabetes mellitus   . Fungal toenail infection   . Gout   . Hemorrhagic cystitis 2012  . Hepatitis    mono  . HTN (hypertension)   . Hypercalcemia 2011   due to sarcoidiosis  . Myocardial infarction    1995, 2002, 2006  . OSA (obstructive sleep apnea)    use bipap setting of 10 and 12  . Osteoarthritis   . Pneumonia 1966, 2011   hx of  . Presence of permanent cardiac pacemaker   . Right sciatic nerve pain    for block thursday 01-21-2014, injections  . Sarcoid ([place]) 2011   pulmonary and bone marrow  . Shortness of breath dyspnea   . Synovial cyst of lumbar spine    l3-l4, l4-l5, injections  . Valvular heart disease    aortic  stenosis/regurgitation    Past Surgical History:  Procedure Laterality Date  . BASAL CELL CARCINOMA EXCISION Right 2015   ear  . CARDIOVERSION N/A 12/30/2013   Procedure: CARDIOVERSION;  Surgeon: [name], MD;  Location: [facility] ENDOSCOPY;  Service: Cardiovascular;  Laterality: N/A;  10:49 cardioversion at 120 joules, then 150 joules, to SB  used  Lido 30mg ,  Propofol 160 mcg  . CARDIOVERSION  2003, 2006, 2012, 2013  . cataract surgery Right 2007  . cataract surgery Left 11-16-13  . COLONOSCOPY N/A 01/29/2014   Procedure: COLONOSCOPY;  Surgeon: Winfield Cunas., MD;  Location: [facility] ENDOSCOPY;  Service: Endoscopy;  Laterality: N/A;  amanda//ja  . CORONARY ARTERY BYPASS GRAFT  2006   x 5  . EP IMPLANTABLE DEVICE N/A 05/05/2015   Procedure: Pacemaker Implant;  Surgeon: Thompson Grayer, MD;  Location: [facility] CV LAB;  Service: Cardiovascular;  Laterality: N/A;  . FLEXIBLE SIGMOIDOSCOPY N/A 12/07/2015   Procedure: FLEXIBLE SIGMOIDOSCOPY;  Surgeon: Wilford Corner, MD;  Location: [facility] ENDOSCOPY;  Service: Endoscopy;  Laterality: N/A;  Unprepped  . INSERT / REPLACE / REMOVE PACEMAKER  05/05/2015  . PORTACATH PLACEMENT    . portacath removed    .  Redo Median sternotomy, extracorporeal cirulation, AVR, Tricuspid valve repair  06/13/2011   AVR(23-mm Edwards pericardial Magna-Ease valve./ TVrepair (34-mm Edwards MC3 annuloplasty ring  . TOTAL KNEE ARTHROPLASTY Right 05/24/2014   Procedure: RIGHT TOTAL KNEE ARTHROPLASTY;  Surgeon: Gearlean Alf, MD;  Location: [facility] ORS;  Service: Orthopedics;  Laterality: Right;  . TRANSURETHRAL RESECTION OF PROSTATE  oct. 2012   TURP  . VASECTOMY  1977    There were no vitals filed for this visit.      Subjective Assessment - 11/09/16 1103    Subjective Pt had RTLE swelling after last session, may or may not be related to exercises. Pt's MD thinks they may be related. Swelling is significantly down per pt.    Currently in Pain? Yes   Pain Score 2    Pain  Location Knee   Pain Orientation Left;Anterior   Pain Descriptors / Indicators Dull;Sore   Aggravating Factors  Standing, walking   Pain Relieving Factors Sitting   Multiple Pain Sites No                         [facility] Adult PT Treatment/Exercise - 11/09/16 0001      Knee/Hip Exercises: Aerobic   Nustep L1 x 10 min     Knee/Hip Exercises: Standing   Heel Raises Both;1 set;10 reps   Hip Abduction AROM;Stengthening;Both;1 set;5 reps   Forward Step Up --  Held   Forward Step Up Limitations --  Held     Knee/Hip Exercises: Seated   Long Arc Quad AROM;Right;Left;1 set;10 reps   Clamshell with TheraBand Red  2 x15   Marching AROM;Both;2 sets;10 reps   Sit to [employer] --  Held today                  PT Short Term Goals - 10/31/16 1221      PT SHORT TERM GOAL #1   Title be independent in initial HEP   Time 4   Period Weeks   Status On-going  Not consistent      PT SHORT TERM GOAL #2   Title perform TUG in < or = to 14 seconds to reduce falls risk   Time 4   Period Weeks   Status On-going  will do next visit when pt has a little more energy     PT SHORT TERM GOAL #3   Title improve LE strength to perform sit to stand with minimal UE support   Time 4   Period Weeks   Status Achieved           PT Long Term Goals - 10/22/16 1158      PT LONG TERM GOAL #1   Title be independent in advanced HEP   Time 8   Period Weeks   Status On-going     PT LONG TERM GOAL #2   Title reduce FOTO to < or = to 31% limitation   Time 8   Period Weeks   Status On-going     PT LONG TERM GOAL #3   Title perform TUG in < or = to 13 seconds to reduce falls risk   Time 8   Period Weeks   Status On-going     PT LONG TERM GOAL #4   Title improve LE strength to perform 5x sit to stand in < or = to 13 seconds to reduce falls risk   Time 8   Period Weeks   Status  On-going     PT LONG TERM GOAL #5   Title report 50% increased ease with negotiating steps    Time 8   Period Weeks   Status On-going               Plan - 11/09/16 1102    Clinical Impression Statement Pt has been struggling with LE edema in his RTLE. His MD feels lt may be coming from his exercises. Today the load and intensity were reduced by about 50%.  Pt was encouraged to not sit in dependent position at home.    Rehab Potential Good   PT Frequency 2x / week   PT Treatment/Interventions ADLs/Self Care Home Management;Cryotherapy;Electrical Stimulation;Functional mobility training;Stair training;Gait training;Moist Heat;Therapeutic activities;Therapeutic exercise;Balance training;Neuromuscular re-education;Patient/family education;Passive range of motion;Manual techniques;Dry needling;Energy conservation   PT Next Visit Plan See how pt did after easing up on workout.   Consulted and Agree with Plan of Care Patient      Patient will benefit from skilled therapeutic intervention in order to improve the following deficits and impairments:  Postural dysfunction, Decreased strength, Decreased mobility, Decreased balance, Impaired flexibility, Pain, Decreased activity tolerance, Decreased endurance, Cardiopulmonary status limiting activity, Abnormal gait, Difficulty walking  Visit Diagnosis: Muscle weakness (generalized)  Other abnormalities of gait and mobility     Problem List Patient Active Problem List   Diagnosis Date Noted  . Chronic systolic CHF (congestive heart failure) ([place]) 09/26/2016  . Right heart failure 06/26/2016  . Leg wound, left 06/26/2016  . Chronic diastolic CHF (congestive heart failure) ([place]) 03/22/2016  . Ascites 12/02/2015  . Diarrhea 12/02/2015  . Leg cramps 12/02/2015  . Acute on chronic diastolic CHF (congestive heart failure), NYHA class 1 ([place])   . Diastolic dysfunction [id number]  . Dyspnea   . CHF (congestive heart failure) ([place]) 09/06/2015  . CAD (coronary artery disease) 05/11/2015  . S/P placement of cardiac pacemaker 05/10/2015   . Acute on chronic diastolic CHF (congestive heart failure) ([place]) 05/10/2015  . Cardiac device in situ   . Sick sinus syndrome ([place]) 05/05/2015  . Acute on chronic renal failure ([place]) 03/23/2015  . Bradycardia, sinus, persistent, severe 03/23/2015  . Acute hyponatremia 03/23/2015  . Hyperkalemia 03/23/2015  . Rib pain on right side 03/23/2015  . Poor appetite 03/23/2015  . Acute gastrointestinal bleeding 03/23/2015  . Nausea & vomiting 03/23/2015  . H/O aortic valve replacement with tissue graft 12/09/2014  . Acute posthemorrhagic anemia 06/01/2014  . UTI (urinary tract infection) 05/31/2014  . Anxiety 05/31/2014  . Insomnia 05/31/2014  . Weakness 05/26/2014  . Fever 05/26/2014  . OA (osteoarthritis) of knee 05/24/2014  . Cataract 09/02/2013  . Osteoarthritis 09/02/2013  . Atrial flutter ([place]) 08/20/2012  . Back pain 08/20/2012  . Severe tricuspid regurgitation by prior echocardiogram 08/14/2011  . Aortic valve replaced 08/14/2011  . Chronic atrial fibrillation ([place]) 07/05/2011  . Chronic kidney disease (CKD), stage III (moderate)   . Hx of CABG 04/26/2011  . Diabetes ([place]) 04/26/2011  . Gout 04/26/2011  . BPH (benign prostatic hyperplasia) 04/26/2011  . Essential hypertension 04/26/2011  . MYOCARDIAL INFARCTION 11/27/2010  . Ischemic heart disease 11/27/2010  . Valvular heart disease 11/27/2010  . Sarcoidosis ([place]) 11/24/2010  . Obstructive sleep apnea 11/24/2010  . Aortic valve disorder 11/24/2010  . DYSPNEA 11/24/2010    Fredrica Capano,PTA 11/09/2016, 11:36 AM  [facility] [address], [place], [place], [address] Phone: [phone]   Fax:  [phone]  Name: [name]  Marcelle Smiling, MD MRN: [id number] Date of Birth: 27-Nov-1941

## 2016-11-14 ENCOUNTER — Encounter: Payer: Self-pay | Admitting: Physical Therapy

## 2016-11-14 ENCOUNTER — Ambulatory Visit: Payer: Medicare Other | Admitting: Physical Therapy

## 2016-11-14 DIAGNOSIS — R2689 Other abnormalities of gait and mobility: Secondary | ICD-10-CM

## 2016-11-14 DIAGNOSIS — M6281 Muscle weakness (generalized): Secondary | ICD-10-CM | POA: Diagnosis not present

## 2016-11-14 NOTE — Therapy (Signed)
[facility] [address] [place], [place], [address] Phone: [phone]   Fax:  [phone]  Physical Therapy Treatment  Patient Details  Name: [name] BRENIZER, [name] MRN: [id number] Date of Birth: 1941-01-04 Referring Provider: Orpah Melter, [name]  Encounter Date: 11/14/2016      PT End of Session - 11/14/16 1231    Visit Number 8   Number of Visits 10   Date for PT Re-Evaluation 12/03/16   PT Start Time [id number]   PT Stop Time 1314   PT Time Calculation (min) 43 min   Activity Tolerance Patient tolerated treatment well   Behavior During Therapy [facility] for tasks assessed/performed      Past Medical History:  Diagnosis Date  . A-fib ([place])    permanent with tachy-brady syndrome  . Anemia   . Anxiety   . Atrial flutter (HCC)    hx of  . Biceps tendon tear    right  . Bone marrow disease   . Cancer ([place])    basal cell  . CHF (congestive heart failure) ([place])   . Chronic kidney disease (CKD), stage III (moderate)    lov note dr Koleen Nimrod nephrology 09-17-13 on chart  . Complication of anesthesia 11-16-13   required alot of versed per anesthesia with cataract surgery  . Coronary heart disease    stent, CABG, RBBB  . DDD (degenerative disc disease)   . Diabetes ([place])   . Diabetes mellitus   . Fungal toenail infection   . Gout   . Hemorrhagic cystitis 2012  . Hepatitis    mono  . HTN (hypertension)   . Hypercalcemia 2011   due to sarcoidiosis  . Myocardial infarction    1995, 2002, 2006  . OSA (obstructive sleep apnea)    use bipap setting of 10 and 12  . Osteoarthritis   . Pneumonia 1966, 2011   hx of  . Presence of permanent cardiac pacemaker   . Right sciatic nerve pain    for block thursday 01-21-2014, injections  . 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 Shortness of breath dyspnea   . Synovial cyst of lumbar spine    l3-l4, l4-l5, injections  . Valvular heart disease    aortic  stenosis/regurgitation    Past Surgical History:  Procedure Laterality Date  . BASAL CELL CARCINOMA EXCISION Right 2015   ear  . CARDIOVERSION N/A 12/30/2013   Procedure: CARDIOVERSION;  Surgeon: [name], [name];  Location: [facility] ENDOSCOPY;  Service: Cardiovascular;  Laterality: N/A;  10:49 cardioversion at 120 joules, then 150 joules, to SB  used  Lido 30mg ,  Propofol 160 mcg  . CARDIOVERSION  2003, 2006, 2012, 2013  . cataract surgery Right 2007  . cataract surgery Left 11-16-13  . COLONOSCOPY N/A 01/29/2014   Procedure: COLONOSCOPY;  Surgeon: Winfield Cunas., [name];  Location: [facility];  Service: Endoscopy;  Laterality: N/A;  amanda//ja  . CORONARY ARTERY BYPASS GRAFT  2006   x 5  . EP IMPLANTABLE DEVICE N/A 05/05/2015   Procedure: Pacemaker Implant;  Surgeon: Thompson Grayer, [name];  Location: [place] [facility] CV LAB;  Service: Cardiovascular;  Laterality: N/A;  . FLEXIBLE SIGMOIDOSCOPY N/A 12/07/2015   Procedure: FLEXIBLE SIGMOIDOSCOPY;  Surgeon: Wilford Corner, [name];  Location: [facility] ENDOSCOPY;  Service: Endoscopy;  Laterality: N/A;  Unprepped  . INSERT / REPLACE / REMOVE PACEMAKER  05/05/2015  . PORTACATH PLACEMENT    . portacath removed    .  Redo Median sternotomy, extracorporeal cirulation, AVR, Tricuspid valve repair  06/13/2011   AVR(23-mm Edwards pericardial Magna-Ease valve./ TVrepair (34-mm Edwards MC3 annuloplasty ring  . TOTAL KNEE ARTHROPLASTY Right 05/24/2014   Procedure: RIGHT TOTAL KNEE ARTHROPLASTY;  Surgeon: Gearlean Alf, [name];  Location: [facility];  Service: Orthopedics;  Laterality: Right;  . TRANSURETHRAL RESECTION OF PROSTATE  oct. 2012   TURP  . VASECTOMY  1977    There were no vitals filed for this visit.      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  Statees getting up and down the risers wore him out.  States his knee is not hurting today, but when on the bike it  increased to 2 out of 10   Currently in Pain? No/denies                         [facility] Adult PT Treatment/Exercise - 11/14/16 0001      Knee/Hip Exercises: Stretches   Active Hamstring Stretch Both;2 reps;10 seconds     Knee/Hip Exercises: Aerobic   Stationary Bike L1 x 8 minutes     Knee/Hip Exercises: Standing   Heel Raises Both;1 set;10 reps   Knee Flexion Strengthening;Both;10 reps   Hip Abduction AROM;Stengthening;Both;1 set;5 reps   Forward Step Up Both;2 sets;5 reps;Hand Hold: 2;Step Height: 6"     Knee/Hip Exercises: Seated   Clamshell with TheraBand Green  2 x15   Knee/Hip Flexion hamstring curl yellow band 20x each                   PT Short Term Goals - 11/14/16 1322      PT SHORT TERM GOAL #1   Title be independent in initial HEP   Time 4   Period Weeks   Status On-going     PT SHORT TERM GOAL #2   Title perform TUG in < or = to 14 seconds to reduce falls risk   Time 4   Period Weeks   Status On-going     PT SHORT TERM GOAL #4   Title report 25% increased ease with negotiating steps at home   Time 4   Period Weeks   Status On-going           PT Long Term Goals - 10/22/16 1158      PT LONG TERM GOAL #1   Title be independent in advanced HEP   Time 8   Period Weeks   Status On-going     PT LONG TERM GOAL #2   Title reduce FOTO to < or = to 31% limitation   Time 8   Period Weeks   Status On-going     PT LONG TERM GOAL #3   Title perform TUG in < or = to 13 seconds to reduce falls risk   Time 8   Period Weeks   Status On-going     PT LONG TERM GOAL #4   Title improve LE strength to perform 5x sit to stand in < or = to 13 seconds to reduce falls risk   Time 8   Period Weeks   Status On-going     PT LONG TERM GOAL #5   Title report 50% increased ease with negotiating steps   Time 8   Period Weeks   Status On-going  Plan - 11/14/16 1324    Clinical Impression Statement Pt did well  with strengthening exercises today and reported fatigue.  Continues to have difficulty with sit to stand and reported knee pain.  Pt needing to use UE and momentum to stand.  Pt continues to need skilled PT for increased LE strength and stability for imroved gait and reduced risk for falls.  Pt will benefit from skilled PT to advance HEP so patient can continue to strengthen and maintain progress made in PT.   Rehab Potential Good   PT Frequency 2x / week   PT Duration 8 weeks   PT Treatment/Interventions ADLs/Self Care Home Management;Cryotherapy;Electrical Stimulation;Functional mobility training;Stair training;Gait training;Moist Heat;Therapeutic activities;Therapeutic exercise;Balance training;Neuromuscular re-education;Patient/family education;Passive range of motion;Manual techniques;Dry needling;Energy conservation   PT Next Visit Plan add sit to stand back to treatment from elevated surface, see how patient does with TUG, ad tandem standing if tolerated   Consulted and Agree with Plan of Care Patient      Patient will benefit from skilled therapeutic intervention in order to improve the following deficits and impairments:  Postural dysfunction, Decreased strength, Decreased mobility, Decreased balance, Impaired flexibility, Pain, Decreased activity tolerance, Decreased endurance, Cardiopulmonary status limiting activity, Abnormal gait, Difficulty walking  Visit Diagnosis: Muscle weakness (generalized)  Other abnormalities of gait and mobility     Problem List Patient Active Problem List   Diagnosis Date Noted  . Chronic systolic CHF (congestive heart failure) ([place]) 09/26/2016  . Right heart failure 06/26/2016  . Leg wound, left 06/26/2016  . Chronic diastolic CHF (congestive heart failure) ([place]) 03/22/2016  . Ascites 12/02/2015  . Diarrhea 12/02/2015  . Leg cramps 12/02/2015  . Acute on chronic diastolic CHF (congestive heart failure), NYHA class 1 ([place])   . Diastolic  dysfunction 123456  . Dyspnea   . CHF (congestive heart failure) ([place]) 09/06/2015  . CAD (coronary artery disease) 05/11/2015  . S/P placement of cardiac pacemaker 05/10/2015  . Acute on chronic diastolic CHF (congestive heart failure) ([place]) 05/10/2015  . Cardiac device in situ   . Sick sinus syndrome ([place]) 05/05/2015  . Acute on chronic renal failure ([place]) 03/23/2015  . Bradycardia, sinus, persistent, severe 03/23/2015  . Acute hyponatremia 03/23/2015  . Hyperkalemia 03/23/2015  . Rib pain on right side 03/23/2015  . Poor appetite 03/23/2015  . Acute gastrointestinal bleeding 03/23/2015  . Nausea & vomiting 03/23/2015  . H/O aortic valve replacement with tissue graft 12/09/2014  . Acute posthemorrhagic anemia 06/01/2014  . UTI (urinary tract infection) 05/31/2014  . Anxiety 05/31/2014  . Insomnia 05/31/2014  . Weakness 05/26/2014  . Fever 05/26/2014  . OA (osteoarthritis) of knee 05/24/2014  . Cataract 09/02/2013  . Osteoarthritis 09/02/2013  . Atrial flutter ([place]) 08/20/2012  . Back pain 08/20/2012  . Severe tricuspid regurgitation by prior echocardiogram 08/14/2011  . Aortic valve replaced 08/14/2011  . Chronic atrial fibrillation ([place]) 07/05/2011  . Chronic kidney disease (CKD), stage III (moderate)   . Hx of CABG 04/26/2011  . Diabetes ([place]) 04/26/2011  . Gout 04/26/2011  . BPH (benign prostatic hyperplasia) 04/26/2011  . Essential hypertension 04/26/2011  . MYOCARDIAL INFARCTION 11/27/2010  . Ischemic heart disease 11/27/2010  . Valvular heart disease 11/27/2010  . Sarcoidosis ([place]) 11/24/2010  . Obstructive sleep apnea 11/24/2010  . Aortic valve disorder 11/24/2010  . DYSPNEA 11/24/2010    Zannie Cove, PT 11/14/2016, 1:35 PM  [facility] [address], [place], [place], [address] Phone: [phone]  Fax:  [phone]  Name: [name] ECHARD, [name] MRN: [id number] Date of Birth:  February 17, 1941

## 2016-11-21 ENCOUNTER — Encounter: Payer: Self-pay | Admitting: Physical Therapy

## 2016-11-21 ENCOUNTER — Ambulatory Visit: Payer: Medicare Other | Admitting: Physical Therapy

## 2016-11-21 DIAGNOSIS — R2689 Other abnormalities of gait and mobility: Secondary | ICD-10-CM

## 2016-11-21 DIAGNOSIS — M6281 Muscle weakness (generalized): Secondary | ICD-10-CM | POA: Diagnosis not present

## 2016-11-21 NOTE — Therapy (Addendum)
[facility] [address] [place], [place], [address] Phone: [phone]   Fax:  [phone]  Physical Therapy Treatment  Patient Details  Name: [name] LENGER, MD MRN: [id number] Date of Birth: [birth year] Referring Provider: Orpah Melter, MD  Encounter Date: 11/21/2016      PT End of Session - 11/21/16 1535    Visit Number 9   Number of Visits 19   Date for PT Re-Evaluation 12/03/16   PT Start Time [id number]   PT Stop Time 1611   PT Time Calculation (min) 41 min   Activity Tolerance Patient tolerated treatment well   Behavior During Therapy [facility] for tasks assessed/performed      Past Medical History:  Diagnosis Date  . A-fib ([place])    permanent with tachy-brady syndrome  . Anemia   . Anxiety   . Atrial flutter (HCC)    hx of  . Biceps tendon tear    right  . Bone marrow disease   . Cancer ([place])    basal cell  . CHF (congestive heart failure) ([place])   . Chronic kidney disease (CKD), stage III (moderate)    lov note dr Koleen Nimrod nephrology 09-17-13 on chart  . Complication of anesthesia 11-16-13   required alot of versed per anesthesia with cataract surgery  . Coronary heart disease    stent, CABG, RBBB  . DDD (degenerative disc disease)   . Diabetes ([place])   . Diabetes mellitus   . Fungal toenail infection   . Gout   . Hemorrhagic cystitis 2012  . Hepatitis    mono  . HTN (hypertension)   . Hypercalcemia 2011   due to sarcoidiosis  . Myocardial infarction    1995, 2002, 2006  . OSA (obstructive sleep apnea)    use bipap setting of 10 and 12  . Osteoarthritis   . Pneumonia 1966, 2011   hx of  . Presence of permanent cardiac pacemaker   . Right sciatic nerve pain    for block thursday 01-21-2014, injections  . Sarcoid ([place]) 2011   pulmonary and bone marrow  . Shortness of breath dyspnea   . Synovial cyst of lumbar spine    l3-l4, l4-l5, injections  . Valvular heart disease    aortic  stenosis/regurgitation    Past Surgical History:  Procedure Laterality Date  . BASAL CELL CARCINOMA EXCISION Right 2015   ear  . CARDIOVERSION N/A 12/30/2013   Procedure: CARDIOVERSION;  Surgeon: [name], MD;  Location: [facility] ENDOSCOPY;  Service: Cardiovascular;  Laterality: N/A;  10:49 cardioversion at 120 joules, then 150 joules, to SB  used  Lido 30mg ,  Propofol 160 mcg  . CARDIOVERSION  2003, 2006, 2012, 2013  . cataract surgery Right 2007  . cataract surgery Left 11-16-13  . COLONOSCOPY N/A 01/29/2014   Procedure: COLONOSCOPY;  Surgeon: Winfield Cunas., MD;  Location: [facility] ENDOSCOPY;  Service: Endoscopy;  Laterality: N/A;  amanda//ja  . CORONARY ARTERY BYPASS GRAFT  2006   x 5  . EP IMPLANTABLE DEVICE N/A 05/05/2015   Procedure: Pacemaker Implant;  Surgeon: Thompson Grayer, MD;  Location: [facility] CV LAB;  Service: Cardiovascular;  Laterality: N/A;  . FLEXIBLE SIGMOIDOSCOPY N/A 12/07/2015   Procedure: FLEXIBLE SIGMOIDOSCOPY;  Surgeon: Wilford Corner, MD;  Location: [facility] ENDOSCOPY;  Service: Endoscopy;  Laterality: N/A;  Unprepped  . INSERT / REPLACE / REMOVE PACEMAKER  05/05/2015  . PORTACATH PLACEMENT    . portacath removed    .  Redo Median sternotomy, extracorporeal cirulation, AVR, Tricuspid valve repair  06/13/2011   AVR(23-mm Edwards pericardial Magna-Ease valve./ TVrepair (34-mm Edwards MC3 annuloplasty ring  . TOTAL KNEE ARTHROPLASTY Right 05/24/2014   Procedure: RIGHT TOTAL KNEE ARTHROPLASTY;  Surgeon: Gearlean Alf, MD;  Location: [facility] ORS;  Service: Orthopedics;  Laterality: Right;  . TRANSURETHRAL RESECTION OF PROSTATE  oct. 2012   TURP  . VASECTOMY  1977    There were no vitals filed for this visit.      Subjective Assessment - 11/21/16 1538    Subjective Only had 1 hr rehersal last night and stood for 30 minutes. He was in the front row so he did not have to climb the risers. Had no knee pain until he had to get out of the low car., but not bad.    Currently in  Pain? Yes   Pain Score 1    Pain Location Knee   Pain Orientation Right;Anterior;Medial   Pain Descriptors / Indicators Dull   Aggravating Factors  Standing   Pain Relieving Factors Sitting    Multiple Pain Sites No            [facility] PT Assessment - 11/21/16 0001      Standardized Balance Assessment   Standardized Balance Assessment Five Times Sit to Stand   Five times sit to stand comments  16 sec  Lt knee pain     Timed Up and Go Test   TUG Normal TUG   Normal TUG (seconds) 12                     [facility] Adult PT Treatment/Exercise - 11/21/16 0001      Knee/Hip Exercises: Aerobic   Nustep L1 x 10 min  concurrent review of status     Knee/Hip Exercises: Standing   Heel Raises Both;1 set;10 reps   Other Standing Knee Exercises Stairs 4x reciprocally 4x     Knee/Hip Exercises: Seated   Knee/Hip Flexion hamstring curl yellow band 20x each   Bil                  PT Short Term Goals - 11/21/16 1543      PT SHORT TERM GOAL #1   Title be independent in initial HEP   Time 4   Period Weeks   Status On-going  Not doing any HEP           PT Long Term Goals - 10/22/16 1158      PT LONG TERM GOAL #1   Title be independent in advanced HEP   Time 8   Period Weeks   Status On-going     PT LONG TERM GOAL #2   Title reduce FOTO to < or = to 31% limitation   Time 8   Period Weeks   Status On-going     PT LONG TERM GOAL #3   Title perform TUG in < or = to 13 seconds to reduce falls risk   Time 8   Period Weeks   Status On-going     PT LONG TERM GOAL #4   Title improve LE strength to perform 5x sit to stand in < or = to 13 seconds to reduce falls risk   Time 8   Period Weeks   Status On-going     PT LONG TERM GOAL #5   Title report 50% increased ease with negotiating steps   Time 8   Period Weeks  Status On-going               Plan - 11-25-2016 1538    Clinical Impression Statement Pt with improving exercise tolerance  and endurance, pain down as well. he was able to get in and out of a very low car today with minimal knee pain (2/10).    Rehab Potential Good   PT Frequency 2x / week   PT Duration 8 weeks   PT Treatment/Interventions ADLs/Self Care Home Management;Cryotherapy;Electrical Stimulation;Functional mobility training;Stair training;Gait training;Moist Heat;Therapeutic activities;Therapeutic exercise;Balance training;Neuromuscular re-education;Patient/family education;Passive range of motion;Manual techniques;Dry needling;Energy conservation   PT Next Visit Plan Knee strength low level   Consulted and Agree with Plan of Care Patient      Patient will benefit from skilled therapeutic intervention in order to improve the following deficits and impairments:  Postural dysfunction, Decreased strength, Decreased mobility, Decreased balance, Impaired flexibility, Pain, Decreased activity tolerance, Decreased endurance, Cardiopulmonary status limiting activity, Abnormal gait, Difficulty walking  Visit Diagnosis: Muscle weakness (generalized)  Other abnormalities of gait and mobility       G-Codes - 25-Nov-2016 1638    Functional Assessment Tool Used TUG 12 seconds, 5x sit to stand, clinical judgement   Functional Limitation Carrying, moving and handling objects   Carrying, Moving and Handling Objects Current Status [id number]) At least 40 percent but less than 60 percent impaired, limited or restricted   Carrying, Moving and Handling Objects Goal Status [id number]) At least 20 percent but less than 40 percent impaired, limited or restricted      Problem List Patient Active Problem List   Diagnosis Date Noted  . Chronic systolic CHF (congestive heart failure) ([place]) 09/26/2016  . Right heart failure 06/26/2016  . Leg wound, left 06/26/2016  . Chronic diastolic CHF (congestive heart failure) ([place]) 03/22/2016  . Ascites 12/02/2015  . Diarrhea 12/02/2015  . Leg cramps 12/02/2015  . Acute on chronic diastolic  CHF (congestive heart failure), NYHA class 1 ([place])   . Diastolic dysfunction [id number]  . Dyspnea   . CHF (congestive heart failure) ([place]) 09/06/2015  . CAD (coronary artery disease) 05/11/2015  . S/P placement of cardiac pacemaker 05/10/2015  . Acute on chronic diastolic CHF (congestive heart failure) ([place]) 05/10/2015  . Cardiac device in situ   . Sick sinus syndrome ([place]) 05/05/2015  . Acute on chronic renal failure ([place]) 03/23/2015  . Bradycardia, sinus, persistent, severe 03/23/2015  . Acute hyponatremia 03/23/2015  . Hyperkalemia 03/23/2015  . Rib pain on right side 03/23/2015  . Poor appetite 03/23/2015  . Acute gastrointestinal bleeding 03/23/2015  . Nausea & vomiting 03/23/2015  . H/O aortic valve replacement with tissue graft 12/09/2014  . Acute posthemorrhagic anemia 06/01/2014  . UTI (urinary tract infection) 05/31/2014  . Anxiety 05/31/2014  . Insomnia 05/31/2014  . Weakness 05/26/2014  . Fever 05/26/2014  . OA (osteoarthritis) of knee 05/24/2014  . Cataract 09/02/2013  . Osteoarthritis 09/02/2013  . Atrial flutter ([place]) 08/20/2012  . Back pain 08/20/2012  . Severe tricuspid regurgitation by prior echocardiogram 08/14/2011  . Aortic valve replaced 08/14/2011  . Chronic atrial fibrillation ([place]) 07/05/2011  . Chronic kidney disease (CKD), stage III (moderate)   . Hx of CABG 04/26/2011  . Diabetes ([place]) 04/26/2011  . Gout 04/26/2011  . BPH (benign prostatic hyperplasia) 04/26/2011  . Essential hypertension 04/26/2011  . MYOCARDIAL INFARCTION 11/27/2010  . Ischemic heart disease 11/27/2010  . Valvular heart disease 11/27/2010  . Sarcoidosis ([place]) 11/24/2010  . Obstructive sleep apnea 11/24/2010  .  Aortic valve disorder 11/24/2010  . DYSPNEA 11/24/2010    TAKACS,KELLY, PTA 11/21/2016, 4:40 PM Sigurd Sos, PT 11/21/16 4:40 PM  [facility] [address] [place], [place], [address] Phone:  [phone]   Fax:  [phone]  Name: KHADEN KIDDY, MD MRN: [id number] Date of Birth: 09/03/1941

## 2016-11-23 ENCOUNTER — Ambulatory Visit: Payer: Medicare Other | Attending: Family Medicine | Admitting: Physical Therapy

## 2016-11-23 DIAGNOSIS — M6281 Muscle weakness (generalized): Secondary | ICD-10-CM | POA: Insufficient documentation

## 2016-11-23 DIAGNOSIS — R2689 Other abnormalities of gait and mobility: Secondary | ICD-10-CM | POA: Insufficient documentation

## 2016-11-28 ENCOUNTER — Ambulatory Visit: Payer: Medicare Other

## 2016-11-28 DIAGNOSIS — M6281 Muscle weakness (generalized): Secondary | ICD-10-CM

## 2016-11-28 DIAGNOSIS — R2689 Other abnormalities of gait and mobility: Secondary | ICD-10-CM

## 2016-11-28 NOTE — Therapy (Signed)
[facility] [address], [place], [place], [address] Phone: [phone]   Fax:  [phone]  Physical Therapy Treatment  Patient Details  Name: [name] NEYLAND, MD MRN: [id number] Date of Birth: May 20, 1941 Referring Provider: Orpah Melter, MD  Encounter Date: 11/28/2016      PT End of Session - 11/28/16 1127    Visit Number 10   PT Start Time 1100   PT Stop Time 1135   PT Time Calculation (min) 35 min   Activity Tolerance Patient tolerated treatment well   Behavior During Therapy [facility] for tasks assessed/performed      Past Medical History:  Diagnosis Date  . A-fib ([place])    permanent with tachy-brady syndrome  . Anemia   . Anxiety   . Atrial flutter (HCC)    hx of  . Biceps tendon tear    right  . Bone marrow disease   . Cancer ([place])    basal cell  . CHF (congestive heart failure) ([place])   . Chronic kidney disease (CKD), stage III (moderate)    lov note dr Koleen Nimrod nephrology 09-17-13 on chart  . Complication of anesthesia 11-16-13   required alot of versed per anesthesia with cataract surgery  . Coronary heart disease    stent, CABG, RBBB  . DDD (degenerative disc disease)   . Diabetes ([place])   . Diabetes mellitus   . Fungal toenail infection   . Gout   . Hemorrhagic cystitis 2012  . Hepatitis    mono  . HTN (hypertension)   . Hypercalcemia 2011   due to sarcoidiosis  . Myocardial infarction    1995, 2002, 2006  . OSA (obstructive sleep apnea)    use bipap setting of 10 and 12  . Osteoarthritis   . Pneumonia 1966, 2011   hx of  . Presence of permanent cardiac pacemaker   . Right sciatic nerve pain    for block thursday 01-21-2014, injections  . 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 Shortness of breath dyspnea   . Synovial cyst of lumbar spine    l3-l4, l4-l5, injections  . Valvular heart disease    aortic stenosis/regurgitation    Past Surgical History:  Procedure  Laterality Date  . BASAL CELL CARCINOMA EXCISION Right 2015   ear  . CARDIOVERSION N/A 12/30/2013   Procedure: CARDIOVERSION;  Surgeon: [name], MD;  Location: [facility] ENDOSCOPY;  Service: Cardiovascular;  Laterality: N/A;  10:49 cardioversion at 120 joules, then 150 joules, to SB  used  Lido 69m,  Propofol 160 mcg  . CARDIOVERSION  2003, 2006, 2012, 2013  . cataract surgery Right 2007  . cataract surgery Left 11-16-13  . COLONOSCOPY N/A 01/29/2014   Procedure: COLONOSCOPY;  Surgeon: JWinfield Cunas, MD;  [facility];  Service: Endoscopy;  Laterality: N/A;  amanda//ja  . CORONARY ARTERY BYPASS GRAFT  2006   x 5  . EP IMPLANTABLE DEVICE N/A 05/05/2015   Procedure: Pacemaker Implant;  Surgeon: JThompson Grayer MD;  [facility];  Service: Cardiovascular;  Laterality: N/A;  . FLEXIBLE SIGMOIDOSCOPY N/A 12/07/2015   Procedure: FLEXIBLE SIGMOIDOSCOPY;  Surgeon: VWilford Corner MD;  [facility];  Service: Endoscopy;  Laterality: N/A;  Unprepped  . INSERT / REPLACE / REMOVE PACEMAKER  05/05/2015  . PORTACATH PLACEMENT    . portacath removed    . Redo Median sternotomy, extracorporeal cirulation, AVR, Tricuspid valve repair  06/13/2011  AVR(23-mm Edwards pericardial Magna-Ease valve./ TVrepair (34-mm Edwards MC3 annuloplasty ring  . TOTAL KNEE ARTHROPLASTY Right 05/24/2014   Procedure: RIGHT TOTAL KNEE ARTHROPLASTY;  Surgeon: Gearlean Alf, MD;  [facility]: [facility];  Service: Orthopedics;  Laterality: Right;  . TRANSURETHRAL RESECTION OF PROSTATE  oct. 2012   TURP  . VASECTOMY  1977    There were no vitals filed for this visit.      Subjective Assessment - 11/28/16 1056    Currently in Pain? Yes   Pain Score 3    Pain Location Knee   Pain Orientation Right;Anterior   Pain Descriptors / Indicators Dull   Pain Type Chronic pain   Pain Onset More than a month ago   Pain Frequency Intermittent   Aggravating Factors  activity, standing too long    Pain Relieving Factors sitting            [facility] PT Assessment - 11/28/16 0001      Assessment   Medical Diagnosis arm weakness, generalized muscle weakness, balance deficits     Prior Function   Level of Independence Independent   Vocation Retired     Associate Professor   Overall Cognitive Status Within Functional Limits for tasks assessed     Observation/Other Assessments   Focus on Therapeutic Outcomes (FOTO)  31% limitation     Strength   Overall Strength Deficits   Overall Strength Comments Rt knee 4+/5, Lt knee 4+/5, bil hips 4/5, ankle DF 4/5 bil.       Standardized Balance Assessment   Standardized Balance Assessment Five Times Sit to Stand   Five times sit to stand comments  16 sec  Lt knee pain     Timed Up and Go Test   TUG Normal TUG   Normal TUG (seconds) 12                     [facility] Adult PT Treatment/Exercise - 11/28/16 0001      Knee/Hip Exercises: Aerobic   Nustep L1 x 15 min  concurrent review of status     Knee/Hip Exercises: Seated   Long Arc Quad Strengthening;2 sets;10 reps   Clamshell with Marga Hoots  2 x15   Marching Strengthening;Both;2 sets;10 reps                  PT Short Term Goals - 11/21/16 1543      PT SHORT TERM GOAL #1   Title be independent in initial HEP   Time 4   Period Weeks   Status On-going  Not doing any HEP           PT Long Term Goals - 11/28/16 1058      PT LONG TERM GOAL #1   Title be independent in advanced HEP   Status Achieved     PT LONG TERM GOAL #2   Title reduce FOTO to < or = to 31% limitation     PT LONG TERM GOAL #3   Title perform TUG in < or = to 13 seconds to reduce falls risk   Status Achieved     PT LONG TERM GOAL #4   Title improve LE strength to perform 5x sit to stand in < or = to 13 seconds to reduce falls risk   Status Partially Met     PT LONG TERM GOAL #5   Title report 50% increased ease with negotiating steps   Status Not Met  Plan - 2016/12/05 1107    Clinical Impression Statement Pt will be discharged to HEP.  Pt with chronic LE and UE strength deficits and limited endurance.  Pt has HEP in place for continued gains.  Pt reports minimal to moderate compliance with HEP.  Pt performed TUG in 12 seconds (improved from 17 seconds at evaluation) and 5x sit to stand was 16 seconds.     PT Next Visit Plan D/C PT to HEP today   Consulted and Agree with Plan of Care Patient      Patient will benefit from skilled therapeutic intervention in order to improve the following deficits and impairments:     Visit Diagnosis: Muscle weakness (generalized)  Other abnormalities of gait and mobility       G-Codes - Dec 05, 2016 1115    Functional Assessment Tool Used TUG, 5x sit to stand, FOTO: 31%    Functional Limitation Carrying, moving and handling objects   Carrying, Moving and Handling Objects Goal Status (R4270) At least 20 percent but less than 40 percent impaired, limited or restricted   Carrying, Moving and Handling Objects Discharge Status [phone]) At least 40 percent but less than 60 percent impaired, limited or restricted      Problem List Patient Active Problem List   Diagnosis Date Noted  . Chronic systolic CHF (congestive heart failure) ([place]) 09/26/2016  . Right heart failure 06/26/2016  . Leg wound, left 06/26/2016  . Chronic diastolic CHF (congestive heart failure) ([place]) 03/22/2016  . Ascites 12/02/2015  . Diarrhea 12/02/2015  . Leg cramps 12/02/2015  . Acute on chronic diastolic CHF (congestive heart failure), NYHA class 1 ([place])   . Diastolic dysfunction 28/31/5176  . Dyspnea   . CHF (congestive heart failure) ([place]) 09/06/2015  . CAD (coronary artery disease) 05/11/2015  . S/P placement of cardiac pacemaker 05/10/2015  . Acute on chronic diastolic CHF (congestive heart failure) ([place]) 05/10/2015  . Cardiac device in situ   . Sick sinus syndrome ([place]) 05/05/2015  . Acute on chronic renal failure ([place])  03/23/2015  . Bradycardia, sinus, persistent, severe 03/23/2015  . Acute hyponatremia 03/23/2015  . Hyperkalemia 03/23/2015  . Rib pain on right side 03/23/2015  . Poor appetite 03/23/2015  . Acute gastrointestinal bleeding 03/23/2015  . Nausea & vomiting 03/23/2015  . H/O aortic valve replacement with tissue graft 12/09/2014  . Acute posthemorrhagic anemia 06/01/2014  . UTI (urinary tract infection) 05/31/2014  . Anxiety 05/31/2014  . Insomnia 05/31/2014  . Weakness 05/26/2014  . Fever 05/26/2014  . OA (osteoarthritis) of knee 05/24/2014  . Cataract 09/02/2013  . Osteoarthritis 09/02/2013  . Atrial flutter ([place]) 08/20/2012  . Back pain 08/20/2012  . Severe tricuspid regurgitation by prior echocardiogram 08/14/2011  . Aortic valve replaced 08/14/2011  . Chronic atrial fibrillation ([place]) 07/05/2011  . Chronic kidney disease (CKD), stage III (moderate)   . Hx of CABG 04/26/2011  . Diabetes ([place]) 04/26/2011  . Gout 04/26/2011  . BPH (benign prostatic hyperplasia) 04/26/2011  . Essential hypertension 04/26/2011  . MYOCARDIAL INFARCTION 11/27/2010  . Ischemic heart disease 11/27/2010  . Valvular heart disease 11/27/2010  . Sarcoidosis ([place]) 11/24/2010  . Obstructive sleep apnea 11/24/2010  . Aortic valve disorder 11/24/2010  . DYSPNEA 11/24/2010    PHYSICAL THERAPY DISCHARGE SUMMARY  Visits from Start of Care: 10  Current functional level related to goals / functional outcomes: Pt with chronic weakness and endurance deficits.  Pt has HEP in place.     Remaining deficits: See above  Education / Equipment: HEP Plan: Patient agrees to discharge.  Patient goals were partially met. Patient is being discharged due to being pleased with the current functional level.  ?????         Sigurd Sos, PT 11/28/16 11:28 AM  [facility] [address] [place], [place], [address] Phone: [phone]   Fax:   [phone]  Name: [name] [place]IL[place], MD MRN: [id number] Date of Birth: January 13, 1941

## 2016-11-29 ENCOUNTER — Ambulatory Visit (HOSPITAL_COMMUNITY)
Admission: RE | Admit: 2016-11-29 | Discharge: 2016-11-29 | Disposition: A | Discharge status: Home / Self Care | Payer: Medicare Other | Source: Ambulatory Visit | Attending: Internal Medicine | Admitting: Internal Medicine

## 2016-11-29 ENCOUNTER — Encounter (HOSPITAL_COMMUNITY): Payer: Self-pay

## 2016-11-29 ENCOUNTER — Encounter (HOSPITAL_COMMUNITY): Payer: Self-pay | Admitting: Internal Medicine

## 2016-11-29 VITALS — BP 134/88 | HR 76 | Wt 236.8 lb

## 2016-11-29 DIAGNOSIS — F419 Anxiety disorder, unspecified: Secondary | ICD-10-CM | POA: Insufficient documentation

## 2016-11-29 DIAGNOSIS — Z880 Allergy status to penicillin: Secondary | ICD-10-CM | POA: Diagnosis not present

## 2016-11-29 DIAGNOSIS — I482 Chronic atrial fibrillation, unspecified: Secondary | ICD-10-CM

## 2016-11-29 DIAGNOSIS — D8689 Sarcoidosis of other sites: Secondary | ICD-10-CM | POA: Insufficient documentation

## 2016-11-29 DIAGNOSIS — Z87891 Personal history of nicotine dependence: Secondary | ICD-10-CM | POA: Diagnosis not present

## 2016-11-29 DIAGNOSIS — R188 Other ascites: Secondary | ICD-10-CM

## 2016-11-29 DIAGNOSIS — Z953 Presence of xenogenic heart valve: Secondary | ICD-10-CM | POA: Insufficient documentation

## 2016-11-29 DIAGNOSIS — Z7901 Long term (current) use of anticoagulants: Secondary | ICD-10-CM | POA: Diagnosis not present

## 2016-11-29 DIAGNOSIS — I2510 Atherosclerotic heart disease of native coronary artery without angina pectoris: Secondary | ICD-10-CM | POA: Diagnosis not present

## 2016-11-29 DIAGNOSIS — Z950 Presence of cardiac pacemaker: Secondary | ICD-10-CM | POA: Insufficient documentation

## 2016-11-29 DIAGNOSIS — M109 Gout, unspecified: Secondary | ICD-10-CM | POA: Insufficient documentation

## 2016-11-29 DIAGNOSIS — M1990 Unspecified osteoarthritis, unspecified site: Secondary | ICD-10-CM | POA: Insufficient documentation

## 2016-11-29 DIAGNOSIS — Z79899 Other long term (current) drug therapy: Secondary | ICD-10-CM | POA: Insufficient documentation

## 2016-11-29 DIAGNOSIS — N184 Chronic kidney disease, stage 4 (severe): Secondary | ICD-10-CM | POA: Diagnosis not present

## 2016-11-29 DIAGNOSIS — E1122 Type 2 diabetes mellitus with diabetic chronic kidney disease: Secondary | ICD-10-CM | POA: Diagnosis not present

## 2016-11-29 DIAGNOSIS — G4733 Obstructive sleep apnea (adult) (pediatric): Secondary | ICD-10-CM | POA: Diagnosis not present

## 2016-11-29 DIAGNOSIS — Z91013 Allergy to seafood: Secondary | ICD-10-CM | POA: Insufficient documentation

## 2016-11-29 DIAGNOSIS — Z794 Long term (current) use of insulin: Secondary | ICD-10-CM | POA: Insufficient documentation

## 2016-11-29 DIAGNOSIS — I5042 Chronic combined systolic (congestive) and diastolic (congestive) heart failure: Secondary | ICD-10-CM | POA: Diagnosis not present

## 2016-11-29 DIAGNOSIS — Z823 Family history of stroke: Secondary | ICD-10-CM | POA: Insufficient documentation

## 2016-11-29 DIAGNOSIS — Z885 Allergy status to narcotic agent status: Secondary | ICD-10-CM | POA: Insufficient documentation

## 2016-11-29 DIAGNOSIS — Z91040 Latex allergy status: Secondary | ICD-10-CM | POA: Diagnosis not present

## 2016-11-29 DIAGNOSIS — Z951 Presence of aortocoronary bypass graft: Secondary | ICD-10-CM | POA: Diagnosis not present

## 2016-11-29 DIAGNOSIS — K761 Chronic passive congestion of liver: Secondary | ICD-10-CM | POA: Diagnosis not present

## 2016-11-29 DIAGNOSIS — Z8249 Family history of ischemic heart disease and other diseases of the circulatory system: Secondary | ICD-10-CM | POA: Diagnosis not present

## 2016-11-29 DIAGNOSIS — I130 Hypertensive heart and chronic kidney disease with heart failure and stage 1 through stage 4 chronic kidney disease, or unspecified chronic kidney disease: Secondary | ICD-10-CM | POA: Insufficient documentation

## 2016-11-29 DIAGNOSIS — I252 Old myocardial infarction: Secondary | ICD-10-CM | POA: Insufficient documentation

## 2016-11-29 DIAGNOSIS — Z888 Allergy status to other drugs, medicaments and biological substances status: Secondary | ICD-10-CM | POA: Insufficient documentation

## 2016-11-29 LAB — COMPREHENSIVE METABOLIC PANEL
ALBUMIN: 3.9 g/dL (ref 3.5–5.0)
ALT: 11 U/L — AB (ref 17–63)
AST: 21 U/L (ref 15–41)
Alkaline Phosphatase: 99 U/L (ref 38–126)
Anion gap: 11 (ref 5–15)
BILIRUBIN TOTAL: 2.5 mg/dL — AB (ref 0.3–1.2)
BUN: 51 mg/dL — AB (ref 6–20)
CHLORIDE: 100 mmol/L — AB (ref 101–111)
CO2: 25 mmol/L (ref 22–32)
CREATININE: 2.88 mg/dL — AB (ref 0.61–1.24)
Calcium: 9.7 mg/dL (ref 8.9–10.3)
GFR calc Af Amer: 23 mL/min — ABNORMAL LOW (ref >60)
GFR calc non Af Amer: 20 mL/min — ABNORMAL LOW (ref >60)
GLUCOSE: 106 mg/dL — AB (ref 65–99)
POTASSIUM: 3.4 mmol/L — AB (ref 3.5–5.1)
Sodium: 136 mmol/L (ref 135–145)
TOTAL PROTEIN: 6.6 g/dL (ref 6.5–8.1)

## 2016-11-29 LAB — CBC
HEMATOCRIT: 44.8 % (ref 39.0–52.0)
HEMOGLOBIN: 15.4 g/dL (ref 13.0–17.0)
MCH: 31.3 pg (ref 26.0–34.0)
MCHC: 34.4 g/dL (ref 30.0–36.0)
MCV: 91.1 fL (ref 78.0–100.0)
Platelets: 72 10*3/uL — ABNORMAL LOW (ref 150–400)
RBC: 4.92 MIL/uL (ref 4.22–5.81)
RDW: 16.7 % — ABNORMAL HIGH (ref 11.5–15.5)
WBC: 5.3 10*3/uL (ref 4.0–10.5)

## 2016-11-29 LAB — PHOSPHORUS: Phosphorus: 4.4 mg/dL (ref 2.5–4.6)

## 2016-11-29 MED ORDER — TORSEMIDE 20 MG PO TABS: 40.0000 mg | ORAL_TABLET | Freq: Two times a day (BID) | ORAL | 3 refills | Status: DC

## 2016-11-29 MED ORDER — POTASSIUM CHLORIDE CRYS ER 20 MEQ PO TBCR: EXTENDED_RELEASE_TABLET | ORAL | 3 refills | Status: DC

## 2016-11-29 MED ORDER — METOLAZONE 2.5 MG PO TABS: ORAL_TABLET | ORAL | 3 refills | Status: DC

## 2016-11-29 NOTE — Patient Instructions (Addendum)
Take Metolazone 2.5 mg tablet once on Friday mornings AS NEEDED for weight gain 3 lbs overnight or 5 lbs in 1 week. Take 20 meq (1 tab) of potassium with metolazone.  Paracentesis schedule 12/07/2016 at 10 am at [facility] in to main entrance reception desk for instructions to radiology department.  Right heart cath has been scheduled with Dr. Haroldine Laws. See attached instruction sheet for additional details.  Follow up with Dr. Haroldine Laws in 2 months.  Do the following things EVERYDAY: 1) Weigh yourself in the morning before breakfast. Write it down and keep it in a log. 2) Take your medicines as prescribed 3) Eat low salt foods-Limit salt (sodium) to 2000 mg per day.  4) Stay as active as you can everyday 5) Limit all fluids for the day to less than 2 liters

## 2016-11-29 NOTE — Progress Notes (Signed)
Patient ID: [name] Fiddler, MD, male   DOB: 1941/07/11, 75 y.o.   MRN: [id number]    Advanced Heart Failure Clinic Note   Referring Physician: Dr. Acie Fredrickson PCP: Betty Martinique, MD Cardiologist: Mertie Moores, MD , previous [facility] patient  Nephrologist: Dr. Arty Baumgartner.  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   We saw him in the HF Clinic for the first time in July 2017. Markedly volume overloaded with R>>L heart failure and ascites. Underwent 3L paracentesis.   Here for routine f/u. Remains on torsemide 40/20. Weight has crept back up slowly over past few months from 195 to 225 and more recently stable at 235. Today was 233. Last paracentesis was in early October. Wife says he is more active and functional than he has been in a long run. Going to PT. Able to sing in chorus.  Uses BiPAP at night.   Creatinine  Running 2.7-3.1. Last creatinine was 2.86 2 weeks ago. Bilirubin was up to 2.5 so he has stopped all ETOH.    Studies:  Echo 09/07/15 LVEF 50-55%, RV mildly dilated, bioprosthetic AV and s/p tricuspid valve repair  Echo 12/04/15 LVEF 35-40%, mild MR, mild LAE, RV mildly dilated and moderately reduced, Mod RAE, PA peak pressure 39 mm Hg, bioprosthetic AV and s/p tricuspid valve repair  Echo 7/17 LVEF 40-45% RV severely HK. RVSP 86m HG bioprosthetic AV and s/p tricuspid valve repair  CT abdomen pelvis 12/06/15 with heterogenous hepatic steatosis and advanced atherosclerosis, status post median sternotomy.   Past Medical History:  Diagnosis Date  . [place]fib ([facility]      permanent with tachy-brady syndrome  . Anemia   . Anxiety   . Atrial flutter (HCC)    hx of  . Biceps tendon tear    right  . Bone marrow disease   . Cancer ([facility]    basal cell  . CHF (congestive heart failure) ([facility]   . Chronic kidney disease (CKD), stage III (moderate)    lov note dr cKoleen Nimrodnephrology 09-17-13 on chart  . Complication of anesthesia 11-16-13   required alot of versed per anesthesia with cataract surgery  . Coronary heart disease    stent, CABG, RBBB  . DDD (degenerative disc disease)   . Diabetes ([facility]   . Diabetes mellitus   . Fungal toenail infection   . Gout   . Hemorrhagic cystitis 2012  . Hepatitis    mono  . HTN (hypertension)   . Hypercalcemia 2011   due to sarcoidiosis  . Myocardial infarction    1995, 2002, 2006  . OSA (obstructive sleep apnea)    use bipap setting of 10 and 12  . Osteoarthritis   . Pneumonia 1966, 2011   hx of  . Presence of permanent cardiac pacemaker   . Right sciatic nerve pain    for block thursday 01-21-2014, injections  . Sarcoid ([facility] 2011   pulmonary and bone marrow  . Shortness of breath dyspnea   . Synovial cyst of lumbar spine    l3-l4, l4-l5, injections  . Valvular heart disease    aortic stenosis/regurgitation    Current  Outpatient Prescriptions  Medication Sig Dispense Refill  . acetaminophen (TYLENOL) 500 MG tablet Take 1,000 mg by mouth 2 (two) times daily as needed (pain).     [name] allopurinol (ZYLOPRIM) 100 MG tablet Take 200 mg by mouth daily with breakfast.     . cetirizine (ZYRTEC) 10 MG tablet Take 10 mg by mouth every evening.    . diazepam (VALIUM) 10 MG tablet Take 1 tablet by mouth daily as needed (ankle and leg cramps).   0  . dicyclomine (BENTYL) 10 MG capsule Take 10 mg by mouth daily.     . diphenoxylate-atropine (LOMOTIL) 2.5-0.025 MG per tablet Take 2 tablets by mouth 2 (two) times daily as needed for diarrhea or loose stools. stomach  1  . insulin glargine (LANTUS) 100 unit/mL SOPN  Inject 2-6 Units into the skin daily after breakfast. CBG 120-140 2 units, 141-160 3 units, 161-180 4 units, 181-200 5 units, 201-220 6 units    . LevOCARNitine L-Tartrate (L-CARNITINE) 500 MG CAPS Take 500 mg by mouth 2 (two) times daily.    . metoprolol (LOPRESSOR) 50 MG tablet Take 1 tablet (50 mg total) by mouth 2 (two) times daily. 60 tablet 11  . Saccharomyces boulardii (FLORASTOR PO) Take 1 capsule by mouth 2-3 times a week    . sucroferric oxyhydroxide (VELPHORO) 500 MG chewable tablet Chew 500 mg by mouth 3 (three) times daily with meals as needed.    . terazosin (HYTRIN) 5 MG capsule Take 5 mg by mouth every morning.    . Testosterone Cypionate 200 MG/ML KIT Inject 1 mL into the muscle every 14 (fourteen) days.    [name] torsemide (DEMADEX) 20 MG tablet Take 40 mg by mouth 2 (two) times daily. Take 40 mg (2 Tabs) in the AM and 62m (1 Tab) in the PM.    . traMADol (ULTRAM) 50 MG tablet Take 50-100 mg by mouth daily as needed for moderate pain.   0  . Turmeric 500 MG TABS Take 1,000 mg by mouth at bedtime.    .Alveda Reasons15 MG TABS tablet TAKE 1 TABLET (15 MG TOTAL) BY MOUTH DAILY. 30 tablet 11  . zolpidem (AMBIEN) 10 MG tablet Take 10 mg by mouth at bedtime.      No current facility-administered medications for this encounter.     Allergies  Allergen Reactions  . Actos [Pioglitazone Hydrochloride] Swelling and Other (See Comments)    Severe peripheral edema  . Codeine Other (See Comments)    Makes Pt aggressive  . Depakote [Divalproex Sodium] Diarrhea    severe  . Diltiazem Other (See Comments)    lethargy and dyspnea  . Hydralazine Other (See Comments)    Severe chills and SOB   . Isosorbide Dinitrate Other (See Comments)    Severe chills and SOB   . Januvia [Sitagliptin] Diarrhea and Other (See Comments)    bradycardia  . Loop Diuretics Other (See Comments)    Spike in BUN and creatine levels  . Losartan Other (See Comments)    aphasia  . Nsaids Other (See Comments)     Renal problems  . Onglyza [Saxagliptin] Diarrhea and Other (See Comments)    bradycardia  . Orudis [Ketoprofen] Other (See Comments)    Cannot take due to renal insufficiency  . Penicillins Swelling and Other (See Comments)    Serum sickness Has patient had a PCN reaction causing immediate rash, facial/tongue/throat swelling, SOB or lightheadedness with hypotension: Yes Has patient had a PCN reaction causing severe  rash involving mucus membranes or skin necrosis: No Has patient had a PCN reaction that required hospitalization Yes Has patient had a PCN reaction occurring within the last 10 years: No If all of the above answers are "NO", then may proceed with Cephalosporin use.   [name] Potassium Iodide Itching and Rash    Severe entire body itching and rash  . Rozerem [Ramelteon] Diarrhea    Severe   . Amiodarone Other (See Comments)    diarrhea  . Latex Swelling  . Verapamil Other (See Comments)    Severe fatigue  . Benazepril Hcl Other (See Comments)    ? Possible lowers platelets  . Indomethacin Other (See Comments)    Renal problems   . Minocycline Other (See Comments)    Makes dizzy and felt just lousy.  [name] Pentazocine Lactate Other (See Comments)    Unknown allergic reaction - pt and wife do not recall this  . Poison Ivy Extract [Poison Ivy Extract] Rash and Other (See Comments)    blisters  . Sertraline Hcl Other (See Comments)    Pt and wife do not recall this  . Shellfish Allergy Rash      Social History   Social History  . Marital status: Married    Spouse name: N/A  . Number of children: 4  . Years of education: N/A   Occupational History  . retired Administrator, [employer]     due to coronary disease in 2000  . 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 Smoking status: Former Smoker    Years: 28.00    Types: Pipe, Cigars    Quit date: 12/25/1995  . Smokeless tobacco: Never Used  . Alcohol use Yes     Comment: 3-5 beer or wine  per week  . Drug use: No  . Sexual activity: Not on file   Other Topics Concern  . Not on file   Social History Narrative  . No narrative on file      Family History  Problem Relation Age of Onset  . Heart attack Father   . Stroke Father   . Stroke Mother   . Allergies    . Asthma    . Heart disease    . Cancer      Vitals:   11/29/16 1216  BP: 134/88  Pulse: 76  SpO2: 94%  Weight: 236 lb 12.8 oz (107.4 kg)    PHYSICAL EXAM: General:  Elderly appearing. No respiratory difficulty HEENT: normal Neck: supple. JVP 10 with prominent cv wave. Carotids 2+ bilat; no bruits. No lymphadenopathy or thyromegaly appreciated. Cor: PMI nondisplaced. Irregularly irregular. 2/6 MR 2/6 TR Lungs: CTAB, normal effort Abdomen: Obese, non-tender, +distended.  No bruits or masses. +BS Extremities: no cyanosis, clubbing, rash. 2+  edema.. Left leg with trace to no edema left leg. Wound healed.  Neuro: alert & oriented x 3, cranial nerves grossly intact. moves all 4 extremities w/o difficulty. Affect pleasant.   ASSESSMENT & PLAN:  1. Chronic combined CHF with R >> L symptoms : Echo 7/17 LVEF 40-45% RV severely HK. RVSP 70m HG bioprosthetic AV and s/p tricuspid valve repair --Volume status elevated. --Will repeat paracentesis --Add metolazone 2.5 mg once a week as needed --Plan RHC after paracentesis and diuresis to assess for PAH. 2. CAD status post CABG 2006 --Cath in 2012 with all grafts patent but severe disease in native vessels in OM and LAD territories 3. Aortic stenosis and tricuspid  regurgitation status post bioprosthetic AVR 2012 and tricuspid valve repair in 05/2011 4. Chronic atrial fibrillation - AT/AF burden > 99% by pacemaker interrogation. - Continue Xarelto 5. CKD Stage IV  --Creatinine stable 2.7-3.1 Followed by Dr. [name]. Planning placement of AVF versus PD catheter --We discussed pros/cons of HD vs PD from a HF perspective 6. DM 2  7. Anxiety  8. Cardiac  cirrhosis with ascites -he seems to have progressive cirrhotic symptoms. Bilirubin recently elevated.  --will recheck labs --refer for paracentesis --we discussed repeat imaging but he wants to defer at this point 9. OHS/OSA - On BiPAP  Total time spent 35 minutes. Over half that time spent discussing above.   [name], Daniel,MD 12:47 PM

## 2016-11-29 NOTE — Progress Notes (Signed)
Cardiac cath instructions reviewed with patient

## 2016-11-30 ENCOUNTER — Telehealth (HOSPITAL_COMMUNITY): Payer: Self-pay

## 2016-11-30 NOTE — Telephone Encounter (Signed)
Lab results reviewed with patient. Serum K 3.4, advised per Dr. Haroldine Laws to take extra 40 meq potassium once today only. Aware and agreeable to plan as stated above.  Renee Pain, RN

## 2016-12-07 ENCOUNTER — Ambulatory Visit (HOSPITAL_COMMUNITY)
Admission: RE | Admit: 2016-12-07 | Discharge: 2016-12-07 | Disposition: A | Discharge status: Home / Self Care | Payer: Medicare Other | Source: Ambulatory Visit | Attending: Internal Medicine | Admitting: Internal Medicine

## 2016-12-07 DIAGNOSIS — R188 Other ascites: Secondary | ICD-10-CM | POA: Insufficient documentation

## 2016-12-07 NOTE — Procedures (Signed)
Successful US guided therapeutic paracentesis from left lateral abdomen.  Yielded 5.0 L of dark amber fluid.  No immediate complications.  Pt tolerated well.   Specimen was not sent for labs.  Docia Barrier PA-C 12/07/2016 10:46 AM

## 2016-12-10 ENCOUNTER — Ambulatory Visit (HOSPITAL_COMMUNITY)
Admission: RE | Admit: 2016-12-10 | Discharge: 2016-12-10 | Disposition: A | Discharge status: Home / Self Care | Payer: Medicare Other | Source: Ambulatory Visit | Attending: Internal Medicine | Admitting: Internal Medicine

## 2016-12-10 ENCOUNTER — Encounter (HOSPITAL_COMMUNITY): Payer: Self-pay | Admitting: Internal Medicine

## 2016-12-10 ENCOUNTER — Encounter (HOSPITAL_COMMUNITY)
Admission: RE | Disposition: A | Discharge status: Home / Self Care | Payer: Self-pay | Source: Ambulatory Visit | Attending: Internal Medicine

## 2016-12-10 ENCOUNTER — Telehealth (HOSPITAL_COMMUNITY): Payer: Self-pay | Admitting: *Deleted

## 2016-12-10 DIAGNOSIS — G4733 Obstructive sleep apnea (adult) (pediatric): Secondary | ICD-10-CM | POA: Insufficient documentation

## 2016-12-10 DIAGNOSIS — I5032 Chronic diastolic (congestive) heart failure: Secondary | ICD-10-CM | POA: Diagnosis not present

## 2016-12-10 DIAGNOSIS — E1122 Type 2 diabetes mellitus with diabetic chronic kidney disease: Secondary | ICD-10-CM | POA: Diagnosis not present

## 2016-12-10 DIAGNOSIS — I082 Rheumatic disorders of both aortic and tricuspid valves: Secondary | ICD-10-CM | POA: Insufficient documentation

## 2016-12-10 DIAGNOSIS — K761 Chronic passive congestion of liver: Secondary | ICD-10-CM | POA: Insufficient documentation

## 2016-12-10 DIAGNOSIS — D869 Sarcoidosis, unspecified: Secondary | ICD-10-CM | POA: Insufficient documentation

## 2016-12-10 DIAGNOSIS — I130 Hypertensive heart and chronic kidney disease with heart failure and stage 1 through stage 4 chronic kidney disease, or unspecified chronic kidney disease: Secondary | ICD-10-CM | POA: Diagnosis not present

## 2016-12-10 DIAGNOSIS — Z7901 Long term (current) use of anticoagulants: Secondary | ICD-10-CM | POA: Insufficient documentation

## 2016-12-10 DIAGNOSIS — M109 Gout, unspecified: Secondary | ICD-10-CM | POA: Insufficient documentation

## 2016-12-10 DIAGNOSIS — Z794 Long term (current) use of insulin: Secondary | ICD-10-CM | POA: Insufficient documentation

## 2016-12-10 DIAGNOSIS — I4892 Unspecified atrial flutter: Secondary | ICD-10-CM | POA: Diagnosis not present

## 2016-12-10 DIAGNOSIS — I4510 Unspecified right bundle-branch block: Secondary | ICD-10-CM | POA: Insufficient documentation

## 2016-12-10 DIAGNOSIS — N184 Chronic kidney disease, stage 4 (severe): Secondary | ICD-10-CM | POA: Diagnosis not present

## 2016-12-10 DIAGNOSIS — F419 Anxiety disorder, unspecified: Secondary | ICD-10-CM | POA: Insufficient documentation

## 2016-12-10 DIAGNOSIS — Z950 Presence of cardiac pacemaker: Secondary | ICD-10-CM | POA: Diagnosis not present

## 2016-12-10 DIAGNOSIS — Z8249 Family history of ischemic heart disease and other diseases of the circulatory system: Secondary | ICD-10-CM | POA: Insufficient documentation

## 2016-12-10 DIAGNOSIS — I252 Old myocardial infarction: Secondary | ICD-10-CM | POA: Insufficient documentation

## 2016-12-10 DIAGNOSIS — I2510 Atherosclerotic heart disease of native coronary artery without angina pectoris: Secondary | ICD-10-CM | POA: Insufficient documentation

## 2016-12-10 DIAGNOSIS — I5042 Chronic combined systolic (congestive) and diastolic (congestive) heart failure: Secondary | ICD-10-CM | POA: Diagnosis not present

## 2016-12-10 DIAGNOSIS — Z880 Allergy status to penicillin: Secondary | ICD-10-CM | POA: Diagnosis not present

## 2016-12-10 DIAGNOSIS — Z91040 Latex allergy status: Secondary | ICD-10-CM | POA: Insufficient documentation

## 2016-12-10 DIAGNOSIS — I482 Chronic atrial fibrillation: Secondary | ICD-10-CM | POA: Diagnosis not present

## 2016-12-10 DIAGNOSIS — Z91013 Allergy to seafood: Secondary | ICD-10-CM | POA: Insufficient documentation

## 2016-12-10 DIAGNOSIS — Z883 Allergy status to other anti-infective agents status: Secondary | ICD-10-CM | POA: Insufficient documentation

## 2016-12-10 DIAGNOSIS — I509 Heart failure, unspecified: Secondary | ICD-10-CM

## 2016-12-10 DIAGNOSIS — M1990 Unspecified osteoarthritis, unspecified site: Secondary | ICD-10-CM | POA: Insufficient documentation

## 2016-12-10 DIAGNOSIS — Z953 Presence of xenogenic heart valve: Secondary | ICD-10-CM | POA: Diagnosis not present

## 2016-12-10 DIAGNOSIS — Z951 Presence of aortocoronary bypass graft: Secondary | ICD-10-CM | POA: Diagnosis not present

## 2016-12-10 DIAGNOSIS — Z87891 Personal history of nicotine dependence: Secondary | ICD-10-CM | POA: Diagnosis not present

## 2016-12-10 DIAGNOSIS — Z823 Family history of stroke: Secondary | ICD-10-CM | POA: Insufficient documentation

## 2016-12-10 DIAGNOSIS — R188 Other ascites: Secondary | ICD-10-CM | POA: Insufficient documentation

## 2016-12-10 HISTORY — PX: CARDIAC CATHETERIZATION: SHX172

## 2016-12-10 LAB — CBC
HEMATOCRIT: 43.1 % (ref 39.0–52.0)
Hemoglobin: 15.1 g/dL (ref 13.0–17.0)
MCH: 31.3 pg (ref 26.0–34.0)
MCHC: 35 g/dL (ref 30.0–36.0)
MCV: 89.4 fL (ref 78.0–100.0)
Platelets: 71 10*3/uL — ABNORMAL LOW (ref 150–400)
RBC: 4.82 MIL/uL (ref 4.22–5.81)
RDW: 16.4 % — AB (ref 11.5–15.5)
WBC: 5.2 10*3/uL (ref 4.0–10.5)

## 2016-12-10 LAB — POCT I-STAT 3, VENOUS BLOOD GAS (G3P V)
ACID-BASE EXCESS: 4 mmol/L — AB (ref 0.0–2.0)
ACID-BASE EXCESS: 5 mmol/L — AB (ref 0.0–2.0)
Acid-Base Excess: 4 mmol/L — ABNORMAL HIGH (ref 0.0–2.0)
BICARBONATE: 29 mmol/L — AB (ref 20.0–28.0)
BICARBONATE: 29.1 mmol/L — AB (ref 20.0–28.0)
Bicarbonate: 30.1 mmol/L — ABNORMAL HIGH (ref 20.0–28.0)
O2 SAT: 70 %
O2 SAT: 70 %
O2 SAT: 71 %
PCO2 VEN: 42.9 mmHg — AB (ref 44.0–60.0)
PCO2 VEN: 44 mmHg (ref 44.0–60.0)
PH VEN: 7.42 (ref 7.250–7.430)
PO2 VEN: 36 mmHg (ref 32.0–45.0)
TCO2: 30 mmol/L (ref 0–100)
TCO2: 30 mmol/L (ref 0–100)
TCO2: 31 mmol/L (ref 0–100)
pCO2, Ven: 44.8 mmHg (ref 44.0–60.0)
pH, Ven: 7.439 — ABNORMAL HIGH (ref 7.250–7.430)
pH, Ven: 7.444 — ABNORMAL HIGH (ref 7.250–7.430)
pO2, Ven: 36 mmHg (ref 32.0–45.0)
pO2, Ven: 36 mmHg (ref 32.0–45.0)

## 2016-12-10 LAB — BASIC METABOLIC PANEL
Anion gap: 15 (ref 5–15)
BUN: 77 mg/dL — AB (ref 6–20)
CO2: 26 mmol/L (ref 22–32)
Calcium: 10.5 mg/dL — ABNORMAL HIGH (ref 8.9–10.3)
Chloride: 94 mmol/L — ABNORMAL LOW (ref 101–111)
Creatinine, Ser: 3.52 mg/dL — ABNORMAL HIGH (ref 0.61–1.24)
GFR calc Af Amer: 18 mL/min — ABNORMAL LOW (ref >60)
GFR, EST NON AFRICAN AMERICAN: 15 mL/min — AB (ref >60)
GLUCOSE: 152 mg/dL — AB (ref 65–99)
POTASSIUM: 3 mmol/L — AB (ref 3.5–5.1)
Sodium: 135 mmol/L (ref 135–145)

## 2016-12-10 LAB — PHOSPHORUS: PHOSPHORUS: 5.6 mg/dL — AB (ref 2.5–4.6)

## 2016-12-10 LAB — GLUCOSE, CAPILLARY: Glucose-Capillary: 143 mg/dL — ABNORMAL HIGH (ref 65–99)

## 2016-12-10 SURGERY — RIGHT HEART CATH

## 2016-12-10 MED ORDER — SODIUM CHLORIDE 0.9% FLUSH: 3.0000 mL | INTRAVENOUS | Status: DC | PRN

## 2016-12-10 MED ORDER — SODIUM CHLORIDE 0.9% FLUSH: 3.0000 mL | Freq: Two times a day (BID) | INTRAVENOUS | Status: DC

## 2016-12-10 MED ORDER — HEPARIN (PORCINE) IN NACL 2-0.9 UNIT/ML-% IJ SOLN
INTRAMUSCULAR | Status: AC
  Filled 2016-12-10: qty 1000

## 2016-12-10 MED ORDER — SODIUM CHLORIDE 0.9 % IV SOLN: 250.0000 mL | INTRAVENOUS | Status: DC | PRN

## 2016-12-10 MED ORDER — FENTANYL CITRATE (PF) 100 MCG/2ML IJ SOLN
INTRAMUSCULAR | Status: AC
  Filled 2016-12-10: qty 2

## 2016-12-10 MED ORDER — POTASSIUM CHLORIDE CRYS ER 20 MEQ PO TBCR
40.0000 meq | EXTENDED_RELEASE_TABLET | Freq: Once | ORAL | Status: AC
  Administered 2016-12-10: 40 meq via ORAL
  Filled 2016-12-10: qty 2

## 2016-12-10 MED ORDER — ONDANSETRON HCL 4 MG/2ML IJ SOLN: 4.0000 mg | Freq: Four times a day (QID) | INTRAMUSCULAR | Status: DC | PRN

## 2016-12-10 MED ORDER — SODIUM CHLORIDE 0.9 % IV SOLN
INTRAVENOUS | Status: DC
  Administered 2016-12-10: 08:00:00 via INTRAVENOUS

## 2016-12-10 MED ORDER — HEPARIN (PORCINE) IN NACL 2-0.9 UNIT/ML-% IJ SOLN
INTRAMUSCULAR | Status: DC | PRN
  Administered 2016-12-10: 10:00:00

## 2016-12-10 MED ORDER — MIDAZOLAM HCL 2 MG/2ML IJ SOLN
INTRAMUSCULAR | Status: AC
  Filled 2016-12-10: qty 2

## 2016-12-10 MED ORDER — MIDAZOLAM HCL 2 MG/2ML IJ SOLN
INTRAMUSCULAR | Status: DC | PRN
  Administered 2016-12-10: 1 mg via INTRAVENOUS

## 2016-12-10 MED ORDER — LIDOCAINE HCL (PF) 1 % IJ SOLN
INTRAMUSCULAR | Status: DC | PRN
  Administered 2016-12-10: 2 mL via SUBCUTANEOUS

## 2016-12-10 MED ORDER — ACETAMINOPHEN 325 MG PO TABS: 650.0000 mg | ORAL_TABLET | ORAL | Status: DC | PRN

## 2016-12-10 MED ORDER — FENTANYL CITRATE (PF) 100 MCG/2ML IJ SOLN
INTRAMUSCULAR | Status: DC | PRN
  Administered 2016-12-10: 25 ug via INTRAVENOUS

## 2016-12-10 MED ORDER — LIDOCAINE HCL (PF) 1 % IJ SOLN
INTRAMUSCULAR | Status: AC
Start: 2016-12-10 — End: 2016-12-10
  Filled 2016-12-10: qty 30

## 2016-12-10 SURGICAL SUPPLY — 9 items
CATH BALLN WEDGE 5F 110CM (CATHETERS) ×3 IMPLANT
CATH INFINITI 5 FR AR1 MOD (CATHETERS) ×3 IMPLANT
KIT HEART RIGHT NAMIC (KITS) ×3 IMPLANT
PACK CARDIAC CATHETERIZATION (CUSTOM PROCEDURE TRAY) ×3 IMPLANT
SHEATH FAST CATH BRACH 5F 5CM (SHEATH) ×3 IMPLANT
TRANSDUCER W/STOPCOCK (MISCELLANEOUS) ×3 IMPLANT
TUBING ART PRESS 72  MALE/FEM (TUBING) ×2
TUBING ART PRESS 72 MALE/FEM (TUBING) ×1 IMPLANT
WIRE EMERALD 3MM-J .025X260CM (WIRE) ×3 IMPLANT

## 2016-12-10 NOTE — Telephone Encounter (Signed)
Patient called requesting for a lab appointment for a post-RHC this Thursday.  Verified with Dr. Haroldine Laws that he needed a BMET this Thursday.  Lab order placed with appointment.

## 2016-12-10 NOTE — Discharge Instructions (Signed)
CALL DR [name] IF ANY PROBLEMS,QUESTIONS, OR CONCERNS; CALL IF ANY BLEEDING,DRAINAGE,FEVER,PAIN,SWELLING, OR REDNESS AT SITE RIGHT ARM

## 2016-12-10 NOTE — H&P (View-Only) (Signed)
Patient ID: [name] Fiddler, MD, male   DOB: 11-16-1941, 75 y.o.   MRN: [id number]    Advanced Heart Failure Clinic Note   Referring Physician: Dr. Acie Fredrickson PCP: Betty Martinique, MD Cardiologist: Mertie Moores, MD , previous [facility] patient  Nephrologist: Dr. Arty Baumgartner.  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   We saw him in the HF Clinic for the first time in July 2017. Markedly volume overloaded with R>>L heart failure and ascites. Underwent 3L paracentesis.   Here for routine f/u. Remains on torsemide 40/20. Weight has crept back up slowly over past few months from 195 to 225 and more recently stable at 235. Today was 233. Last paracentesis was in early October. Wife says he is more active and functional than he has been in a long run. Going to PT. Able to sing in chorus.  Uses BiPAP at night.   Creatinine  Running 2.7-3.1. Last creatinine was 2.86 2 weeks ago. Bilirubin was up to 2.5 so he has stopped all ETOH.    Studies:  Echo 09/07/15 LVEF 50-55%, RV mildly dilated, bioprosthetic AV and s/p tricuspid valve repair  Echo 12/04/15 LVEF 35-40%, mild MR, mild LAE, RV mildly dilated and moderately reduced, Mod RAE, PA peak pressure 39 mm Hg, bioprosthetic AV and s/p tricuspid valve repair  Echo 7/17 LVEF 40-45% RV severely HK. RVSP 41m HG bioprosthetic AV and s/p tricuspid valve repair  CT abdomen pelvis 12/06/15 with heterogenous hepatic steatosis and advanced atherosclerosis, status post median sternotomy.   Past Medical History:  Diagnosis Date  . [place] ([place] [place]      permanent with tachy-brady syndrome  . Anemia   . Anxiety   . Atrial flutter (HCC)    hx of  . Biceps tendon tear    right  . Bone marrow disease   . Cancer ([place]    basal cell  . CHF (congestive heart failure) ([facility]   . Chronic kidney disease (CKD), stage III (moderate)    lov note dr cKoleen Nimrodnephrology 09-17-13 on chart  . Complication of anesthesia 11-16-13   required alot of versed per anesthesia with cataract surgery  . Coronary heart disease    stent, CABG, RBBB  . DDD (degenerative disc disease)   . Diabetes ([facility]   . Diabetes mellitus   . Fungal toenail infection   . Gout   . Hemorrhagic cystitis 2012  . Hepatitis    mono  . HTN (hypertension)   . Hypercalcemia 2011   due to sarcoidiosis  . Myocardial infarction    1995, 2002, 2006  . OSA (obstructive sleep apnea)    use bipap setting of 10 and 12  . Osteoarthritis   . Pneumonia 1966, 2011   hx of  . Presence of permanent cardiac pacemaker   . Right sciatic nerve pain    for block thursday 01-21-2014, injections  . [place]arcoid ([place] 2011   pulmonary and bone marrow  . Shortness of breath dyspnea   . Synovial cyst of lumbar spine    l3-l4, l4-l5, injections  . Valvular heart disease    aortic stenosis/regurgitation    Current  Outpatient Prescriptions  Medication Sig Dispense Refill  . acetaminophen (TYLENOL) 500 MG tablet Take 1,000 mg by mouth 2 (two) times daily as needed (pain).     [name] allopurinol (ZYLOPRIM) 100 MG tablet Take 200 mg by mouth daily with breakfast.     . cetirizine (ZYRTEC) 10 MG tablet Take 10 mg by mouth every evening.    . diazepam (VALIUM) 10 MG tablet Take 1 tablet by mouth daily as needed (ankle and leg cramps).   0  . dicyclomine (BENTYL) 10 MG capsule Take 10 mg by mouth daily.     . diphenoxylate-atropine (LOMOTIL) 2.5-0.025 MG per tablet Take 2 tablets by mouth 2 (two) times daily as needed for diarrhea or loose stools. stomach  1  . insulin glargine (LANTUS) 100 unit/mL SOPN  Inject 2-6 Units into the skin daily after breakfast. CBG 120-140 2 units, 141-160 3 units, 161-180 4 units, 181-200 5 units, 201-220 6 units    . LevOCARNitine L-Tartrate (L-CARNITINE) 500 MG CAPS Take 500 mg by mouth 2 (two) times daily.    . metoprolol (LOPRESSOR) 50 MG tablet Take 1 tablet (50 mg total) by mouth 2 (two) times daily. 60 tablet 11  . Saccharomyces boulardii (FLORASTOR PO) Take 1 capsule by mouth 2-3 times a week    . sucroferric oxyhydroxide (VELPHORO) 500 MG chewable tablet Chew 500 mg by mouth 3 (three) times daily with meals as needed.    . terazosin (HYTRIN) 5 MG capsule Take 5 mg by mouth every morning.    . Testosterone Cypionate 200 MG/ML KIT Inject 1 mL into the muscle every 14 (fourteen) days.    [name] torsemide (DEMADEX) 20 MG tablet Take 40 mg by mouth 2 (two) times daily. Take 40 mg (2 Tabs) in the AM and 26m (1 Tab) in the PM.    . traMADol (ULTRAM) 50 MG tablet Take 50-100 mg by mouth daily as needed for moderate pain.   0  . Turmeric 500 MG TABS Take 1,000 mg by mouth at bedtime.    .Alveda Reasons15 MG TABS tablet TAKE 1 TABLET (15 MG TOTAL) BY MOUTH DAILY. 30 tablet 11  . zolpidem (AMBIEN) 10 MG tablet Take 10 mg by mouth at bedtime.      No current facility-administered medications for this encounter.     Allergies  Allergen Reactions  . Actos [Pioglitazone Hydrochloride] Swelling and Other (See Comments)    Severe peripheral edema  . Codeine Other (See Comments)    Makes Pt aggressive  . Depakote [Divalproex Sodium] Diarrhea    severe  . Diltiazem Other (See Comments)    lethargy and dyspnea  . Hydralazine Other (See Comments)    Severe chills and SOB   . Isosorbide Dinitrate Other (See Comments)    Severe chills and SOB   . Januvia [Sitagliptin] Diarrhea and Other (See Comments)    bradycardia  . Loop Diuretics Other (See Comments)    Spike in BUN and creatine levels  . Losartan Other (See Comments)    aphasia  . Nsaids Other (See Comments)     Renal problems  . Onglyza [Saxagliptin] Diarrhea and Other (See Comments)    bradycardia  . Orudis [Ketoprofen] Other (See Comments)    Cannot take due to renal insufficiency  . Penicillins Swelling and Other (See Comments)    Serum sickness Has patient had a PCN reaction causing immediate rash, facial/tongue/throat swelling, SOB or lightheadedness with hypotension: Yes Has patient had a PCN reaction causing severe  rash involving mucus membranes or skin necrosis: No Has patient had a PCN reaction that required hospitalization Yes Has patient had a PCN reaction occurring within the last 10 years: No If all of the above answers are "NO", then may proceed with Cephalosporin use.   [name] Potassium Iodide Itching and Rash    Severe entire body itching and rash  . Rozerem [Ramelteon] Diarrhea    Severe   . Amiodarone Other (See Comments)    diarrhea  . Latex Swelling  . Verapamil Other (See Comments)    Severe fatigue  . Benazepril Hcl Other (See Comments)    ? Possible lowers platelets  . Indomethacin Other (See Comments)    Renal problems   . Minocycline Other (See Comments)    Makes dizzy and felt just lousy.  [name] Pentazocine Lactate Other (See Comments)    Unknown allergic reaction - pt and wife do not recall this  . Poison Ivy Extract [Poison Ivy Extract] Rash and Other (See Comments)    blisters  . Sertraline Hcl Other (See Comments)    Pt and wife do not recall this  . Shellfish Allergy Rash      Social History   Social History  . Marital status: Married    Spouse name: N/A  . Number of children: 4  . Years of education: N/A   Occupational History  . retired Administrator, [employer]     due to coronary disease in 2000  . 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 Smoking status: Former Smoker    Years: 28.00    Types: Pipe, Cigars    Quit date: 12/25/1995  . Smokeless tobacco: Never Used  . Alcohol use Yes     Comment: 3-5 beer or wine  per week  . Drug use: No  . Sexual activity: Not on file   Other Topics Concern  . Not on file   Social History Narrative  . No narrative on file      Family History  Problem Relation Age of Onset  . Heart attack Father   . Stroke Father   . Stroke Mother   . Allergies    . Asthma    . Heart disease    . Cancer      Vitals:   11/29/16 1216  BP: 134/88  Pulse: 76  SpO2: 94%  Weight: 236 lb 12.8 oz (107.4 kg)    PHYSICAL EXAM: General:  Elderly appearing. No respiratory difficulty HEENT: normal Neck: supple. JVP 10 with prominent cv wave. Carotids 2+ bilat; no bruits. No lymphadenopathy or thyromegaly appreciated. Cor: PMI nondisplaced. Irregularly irregular. 2/6 MR 2/6 TR Lungs: CTAB, normal effort Abdomen: Obese, non-tender, +distended.  No bruits or masses. +BS Extremities: no cyanosis, clubbing, rash. 2+  edema.. Left leg with trace to no edema left leg. Wound healed.  Neuro: alert & oriented x 3, cranial nerves grossly intact. moves all 4 extremities w/o difficulty. Affect pleasant.   ASSESSMENT & PLAN:  1. Chronic combined CHF with R >> L symptoms : Echo 7/17 LVEF 40-45% RV severely HK. RVSP 45m HG bioprosthetic AV and s/p tricuspid valve repair --Volume status elevated. --Will repeat paracentesis --Add metolazone 2.5 mg once a week as needed --Plan RHC after paracentesis and diuresis to assess for PAH. 2. CAD status post CABG 2006 --Cath in 2012 with all grafts patent but severe disease in native vessels in OM and LAD territories 3. Aortic stenosis and tricuspid  regurgitation status post bioprosthetic AVR 2012 and tricuspid valve repair in 05/2011 4. Chronic atrial fibrillation - AT/AF burden > 99% by pacemaker interrogation. - Continue Xarelto 5. CKD Stage IV  --Creatinine stable 2.7-3.1 Followed by Dr. [name]. Planning placement of AVF versus PD catheter --We discussed pros/cons of HD vs PD from a HF perspective 6. DM 2  7. Anxiety  8. Cardiac  cirrhosis with ascites -he seems to have progressive cirrhotic symptoms. Bilirubin recently elevated.  --will recheck labs --refer for paracentesis --we discussed repeat imaging but he wants to defer at this point 9. OHS/OSA - On BiPAP  Total time spent 35 minutes. Over half that time spent discussing above.   [name], Daniel,MD 12:47 PM

## 2016-12-10 NOTE — Interval H&P Note (Signed)
History and Physical Interval Note:  12/10/2016 9:20 AM  [name] Pearson II, MD  has presented today for surgery, with the diagnosis of hf  The various methods of treatment have been discussed with the patient and family. After consideration of risks, benefits and other options for treatment, the patient has consented to  Procedure(s): Right Heart Cath (N/A) as a surgical intervention .  The patient's history has been reviewed, patient examined, no change in status, stable for surgery.  I have reviewed the patient's chart and labs.  Questions were answered to the patient's satisfaction.     [name], Quillian Quince

## 2016-12-11 MED FILL — Heparin Sodium (Porcine) 2 Unit/ML in Sodium Chloride 0.9%: INTRAMUSCULAR | Qty: 500 | Status: AC

## 2016-12-12 ENCOUNTER — Encounter (HOSPITAL_COMMUNITY): Payer: Self-pay | Admitting: Internal Medicine

## 2016-12-13 ENCOUNTER — Ambulatory Visit (HOSPITAL_COMMUNITY)
Admission: RE | Admit: 2016-12-13 | Discharge: 2016-12-13 | Disposition: A | Discharge status: Home / Self Care | Payer: Medicare Other | Source: Ambulatory Visit | Attending: Internal Medicine | Admitting: Internal Medicine

## 2016-12-13 DIAGNOSIS — I509 Heart failure, unspecified: Secondary | ICD-10-CM | POA: Insufficient documentation

## 2016-12-13 LAB — BASIC METABOLIC PANEL
ANION GAP: 12 (ref 5–15)
BUN: 80 mg/dL — AB (ref 6–20)
CO2: 29 mmol/L (ref 22–32)
Calcium: 10.5 mg/dL — ABNORMAL HIGH (ref 8.9–10.3)
Chloride: 96 mmol/L — ABNORMAL LOW (ref 101–111)
Creatinine, Ser: 3.75 mg/dL — ABNORMAL HIGH (ref 0.61–1.24)
GFR calc Af Amer: 17 mL/min — ABNORMAL LOW (ref >60)
GFR calc non Af Amer: 14 mL/min — ABNORMAL LOW (ref >60)
Glucose, Bld: 139 mg/dL — ABNORMAL HIGH (ref 65–99)
Potassium: 3 mmol/L — ABNORMAL LOW (ref 3.5–5.1)
Sodium: 137 mmol/L (ref 135–145)

## 2016-12-18 ENCOUNTER — Encounter (HOSPITAL_COMMUNITY): Payer: Self-pay | Admitting: Internal Medicine

## 2016-12-26 ENCOUNTER — Ambulatory Visit (INDEPENDENT_AMBULATORY_CARE_PROVIDER_SITE_OTHER): Payer: Medicare Other | Admitting: *Deleted

## 2016-12-26 DIAGNOSIS — I495 Sick sinus syndrome: Secondary | ICD-10-CM

## 2016-12-27 ENCOUNTER — Telehealth: Payer: Self-pay | Admitting: Cardiology

## 2016-12-27 NOTE — Progress Notes (Signed)
Remote pacemaker transmission.   

## 2016-12-27 NOTE — Telephone Encounter (Signed)
Spoke with pt and reminded pt of remote transmission that is due today. Pt verbalized understanding.   

## 2016-12-28 ENCOUNTER — Encounter: Payer: Self-pay | Admitting: Cardiology

## 2017-01-08 ENCOUNTER — Other Ambulatory Visit: Payer: Self-pay

## 2017-01-08 MED ORDER — TERAZOSIN HCL 5 MG PO CAPS: 5.0000 mg | ORAL_CAPSULE | ORAL | 6 refills | Status: DC

## 2017-01-12 LAB — CUP PACEART REMOTE DEVICE CHECK
Battery Voltage: 3.01 V
Brady Statistic AS VP Percent: 0 %
Brady Statistic AS VS Percent: 0 %
Implantable Lead Location: 753860
Implantable Pulse Generator Implant Date: 20160512
Lead Channel Impedance Value: 450 Ohm
Lead Channel Pacing Threshold Pulse Width: 0.5 ms
Lead Channel Sensing Intrinsic Amplitude: 1.7 mV
Lead Channel Setting Pacing Amplitude: 2 V
Lead Channel Setting Pacing Amplitude: 2.5 V
Lead Channel Setting Pacing Pulse Width: 0.5 ms
MDC IDC LEAD IMPLANT DT: 20160512
MDC IDC LEAD IMPLANT DT: 20160512
MDC IDC LEAD LOCATION: 753859
MDC IDC MSMT BATTERY REMAINING LONGEVITY: 127 mo
MDC IDC MSMT BATTERY REMAINING PERCENTAGE: 95.5 %
MDC IDC MSMT LEADCHNL RV IMPEDANCE VALUE: 590 Ohm
MDC IDC MSMT LEADCHNL RV PACING THRESHOLD AMPLITUDE: 1.25 V
MDC IDC MSMT LEADCHNL RV SENSING INTR AMPL: 8.4 mV
MDC IDC SESS DTM: 20180104173020
MDC IDC SET LEADCHNL RV SENSING SENSITIVITY: 2 mV
MDC IDC STAT BRADY AP VP PERCENT: 0 %
MDC IDC STAT BRADY AP VS PERCENT: 0 %
MDC IDC STAT BRADY RA PERCENT PACED: 1 %
MDC IDC STAT BRADY RV PERCENT PACED: 59 %
Pulse Gen Model: 2240
Pulse Gen Serial Number: 7759894

## 2017-01-30 ENCOUNTER — Ambulatory Visit (HOSPITAL_COMMUNITY)
Admission: RE | Admit: 2017-01-30 | Discharge: 2017-01-30 | Disposition: A | Discharge status: Home / Self Care | Payer: Medicare Other | Source: Ambulatory Visit | Attending: Internal Medicine | Admitting: Internal Medicine

## 2017-01-30 ENCOUNTER — Encounter (HOSPITAL_COMMUNITY): Payer: Self-pay | Admitting: Internal Medicine

## 2017-01-30 VITALS — BP 122/68 | HR 63 | Wt 226.0 lb

## 2017-01-30 DIAGNOSIS — I482 Chronic atrial fibrillation, unspecified: Secondary | ICD-10-CM

## 2017-01-30 DIAGNOSIS — I130 Hypertensive heart and chronic kidney disease with heart failure and stage 1 through stage 4 chronic kidney disease, or unspecified chronic kidney disease: Secondary | ICD-10-CM | POA: Insufficient documentation

## 2017-01-30 DIAGNOSIS — I50810 Right heart failure, unspecified: Secondary | ICD-10-CM | POA: Diagnosis not present

## 2017-01-30 DIAGNOSIS — Z7901 Long term (current) use of anticoagulants: Secondary | ICD-10-CM | POA: Diagnosis not present

## 2017-01-30 DIAGNOSIS — K761 Chronic passive congestion of liver: Secondary | ICD-10-CM | POA: Insufficient documentation

## 2017-01-30 DIAGNOSIS — Z87891 Personal history of nicotine dependence: Secondary | ICD-10-CM | POA: Insufficient documentation

## 2017-01-30 DIAGNOSIS — R188 Other ascites: Secondary | ICD-10-CM

## 2017-01-30 DIAGNOSIS — I5032 Chronic diastolic (congestive) heart failure: Secondary | ICD-10-CM

## 2017-01-30 DIAGNOSIS — I2510 Atherosclerotic heart disease of native coronary artery without angina pectoris: Secondary | ICD-10-CM | POA: Diagnosis not present

## 2017-01-30 DIAGNOSIS — Z79899 Other long term (current) drug therapy: Secondary | ICD-10-CM | POA: Diagnosis not present

## 2017-01-30 DIAGNOSIS — Z823 Family history of stroke: Secondary | ICD-10-CM | POA: Diagnosis not present

## 2017-01-30 DIAGNOSIS — Z953 Presence of xenogenic heart valve: Secondary | ICD-10-CM | POA: Insufficient documentation

## 2017-01-30 DIAGNOSIS — I495 Sick sinus syndrome: Secondary | ICD-10-CM | POA: Diagnosis not present

## 2017-01-30 DIAGNOSIS — Z880 Allergy status to penicillin: Secondary | ICD-10-CM | POA: Insufficient documentation

## 2017-01-30 DIAGNOSIS — D869 Sarcoidosis, unspecified: Secondary | ICD-10-CM | POA: Diagnosis not present

## 2017-01-30 DIAGNOSIS — E1122 Type 2 diabetes mellitus with diabetic chronic kidney disease: Secondary | ICD-10-CM | POA: Diagnosis not present

## 2017-01-30 DIAGNOSIS — F419 Anxiety disorder, unspecified: Secondary | ICD-10-CM | POA: Insufficient documentation

## 2017-01-30 DIAGNOSIS — Z91013 Allergy to seafood: Secondary | ICD-10-CM | POA: Insufficient documentation

## 2017-01-30 DIAGNOSIS — M109 Gout, unspecified: Secondary | ICD-10-CM | POA: Diagnosis not present

## 2017-01-30 DIAGNOSIS — Z950 Presence of cardiac pacemaker: Secondary | ICD-10-CM | POA: Diagnosis not present

## 2017-01-30 DIAGNOSIS — N183 Chronic kidney disease, stage 3 (moderate): Secondary | ICD-10-CM | POA: Diagnosis not present

## 2017-01-30 DIAGNOSIS — Z881 Allergy status to other antibiotic agents status: Secondary | ICD-10-CM | POA: Insufficient documentation

## 2017-01-30 DIAGNOSIS — Z885 Allergy status to narcotic agent status: Secondary | ICD-10-CM | POA: Insufficient documentation

## 2017-01-30 DIAGNOSIS — I252 Old myocardial infarction: Secondary | ICD-10-CM | POA: Diagnosis not present

## 2017-01-30 DIAGNOSIS — Z794 Long term (current) use of insulin: Secondary | ICD-10-CM | POA: Insufficient documentation

## 2017-01-30 DIAGNOSIS — Z8249 Family history of ischemic heart disease and other diseases of the circulatory system: Secondary | ICD-10-CM | POA: Insufficient documentation

## 2017-01-30 DIAGNOSIS — G4733 Obstructive sleep apnea (adult) (pediatric): Secondary | ICD-10-CM | POA: Diagnosis not present

## 2017-01-30 DIAGNOSIS — M1990 Unspecified osteoarthritis, unspecified site: Secondary | ICD-10-CM | POA: Insufficient documentation

## 2017-01-30 DIAGNOSIS — I5042 Chronic combined systolic (congestive) and diastolic (congestive) heart failure: Secondary | ICD-10-CM | POA: Insufficient documentation

## 2017-01-30 DIAGNOSIS — Z951 Presence of aortocoronary bypass graft: Secondary | ICD-10-CM | POA: Insufficient documentation

## 2017-01-30 DIAGNOSIS — Z888 Allergy status to other drugs, medicaments and biological substances status: Secondary | ICD-10-CM | POA: Insufficient documentation

## 2017-01-30 DIAGNOSIS — Z91040 Latex allergy status: Secondary | ICD-10-CM | POA: Insufficient documentation

## 2017-01-30 NOTE — Patient Instructions (Signed)
Follow up with Dr. [name] in 3-4 months 

## 2017-01-30 NOTE — Progress Notes (Signed)
Patient ID: [name], [name], male   DOB: 1941/05/11, 76 y.o.   MRN: [id number]    Advanced Heart Failure Clinic Note   Referring Physician: Dr. Acie [name] PCP: [name], [name] Cardiologist: [name], [name] , previous [facility] patient  Nephrologist: Dr. Arty [name].  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   We saw him in the HF Clinic for the first time in July 2017. [name]. Underwent 3L paracentesis.   Here for routine f/u. Feels pretty good. Breathing better. Able to sing in chorus.Weight range 219-224 at home. Creatinine hovers around 3.2-3.5.Remains on torsemide 40/20. Takes metolazone 1x/week. Platelets dropped recently but now back up into 49s. Last paracentesis was in early December. Uses BiPAP at night. Planning to pursue PD when it is time but will need AVF as back-up   Creatinine  Running 2.7-3.1. Last creatinine was 2.86 2 weeks ago. Bilirubin was up to 2.5 so he has stopped all ETOH.    Studies:  Echo 09/07/15 LVEF 50-55%, RV mildly dilated, bioprosthetic AV and s/p tricuspid valve repair  Echo 12/04/15 LVEF 35-40%, mild MR, mild LAE, RV mildly dilated and moderately reduced, Mod RAE, PA peak pressure 39 mm Hg, bioprosthetic AV and s/p tricuspid valve repair  Echo 7/17 LVEF 40-45% RV severely HK. RVSP 13m HG bioprosthetic AV and s/p tricuspid valve repair  CT abdomen pelvis 12/06/15 with heterogenous hepatic steatosis and advanced atherosclerosis, status post median sternotomy.  [name] Hills12/17 (volume  overloaded at time)  RA = 18 RV = 79/17 PA = 80/23 (46) PCW = 31 (v waves to 40) Fick cardiac output/index = 4.9/2.2 PVR = 3.0 WU FA sat = 98% PA sat = 70%, 71% High SVC sat = 70%    Past Medical History:  Diagnosis Date  . A-fib ([name]    permanent with tachy-brady syndrome  . Anemia   . Anxiety   . Atrial flutter (HCC)    hx of  . Biceps tendon tear    right  . Bone marrow disease   . Cancer ([name]    basal cell  . CHF (congestive heart failure) ([name]   . Chronic kidney disease (CKD), stage III (moderate)    lov note dr cKoleen [name] 09-17-13 on chart  . Complication of anesthesia 11-16-13   required alot of versed per anesthesia with cataract surgery  . Coronary heart disease    stent, CABG, RBBB  . DDD (degenerative disc disease)   . Diabetes ([name]   . Diabetes mellitus   . Fungal toenail infection   . Gout   . Hemorrhagic cystitis 2012  . Hepatitis    mono  . HTN (hypertension)   . Hypercalcemia 2011   due to sarcoidiosis  . Myocardial infarction    1995, 2002, 2006  . OSA (obstructive sleep apnea)    use bipap setting of 10 and 12  . Osteoarthritis   . Pneumonia 1966, 2011   hx of  . Presence of permanent cardiac pacemaker   . Right sciatic nerve pain    for block thursday 01-21-2014, injections  .  Sarcoid ([name]) 2011   pulmonary and bone marrow  . Shortness of breath dyspnea   . Synovial cyst of lumbar spine    l3-l4, l4-l5, injections  . Valvular heart disease    aortic stenosis/regurgitation    Current Outpatient Prescriptions  Medication Sig Dispense Refill  . acetaminophen (TYLENOL) 500 MG tablet Take 1,000 mg by mouth 2 (two) times daily as needed (pain).     [name] allopurinol (ZYLOPRIM) 100 MG tablet Take 200 mg by mouth daily with breakfast.     . cetirizine (ZYRTEC) 10 MG tablet Take 10 mg by mouth daily as needed for allergies.     . diazepam (VALIUM) 10 MG tablet Take 1 tablet by mouth daily as needed (ankle and leg cramps).   0  .  dicyclomine (BENTYL) 10 MG capsule Take 10 mg by mouth 2 (two) times daily.     . diphenoxylate-atropine (LOMOTIL) 2.5-0.025 MG per tablet Take 2 tablets by mouth 2 (two) times daily as needed for diarrhea or loose stools. stomach  1  . insulin glargine (LANTUS) 100 unit/mL SOPN Inject 2-6 Units into the skin daily as needed (CBG >120). CBG 120-140 2 units, 141-160 3 units, 161-180 4 units, 181-200 5 units, 201-220 6 units    . LevOCARNitine L-Tartrate (L-CARNITINE) 500 MG CAPS Take 500 mg by mouth 2 (two) times daily.    . metolazone (ZAROXOLYN) 2.5 MG tablet Take 2.5 mg (1 tab) once weekly on Fridays AS NEEDED (weight gain 3 lbs overnight or 5 lbs in 1 week). Take with 20 meq potassium. 5 tablet 3  . metoprolol (LOPRESSOR) 50 MG tablet Take 1 tablet (50 mg total) by mouth 2 (two) times daily. 60 tablet 11  . Saccharomyces boulardii (FLORASTOR PO) Take 1 capsule by mouth 2-3 times a week    . terazosin (HYTRIN) 5 MG capsule Take 1 capsule (5 mg total) by mouth every morning. 30 capsule 6  . Testosterone Cypionate 200 MG/ML KIT Inject 1 mL into the muscle every 14 (fourteen) days.    [name] torsemide (DEMADEX) 20 MG tablet Take 20-40 mg by mouth as directed. Take 91m in the AM and 21min the PM    . Turmeric 500 MG TABS Take 1,000 mg by mouth at bedtime.    . Alveda Reasons5 MG TABS tablet TAKE 1 TABLET (15 MG TOTAL) BY MOUTH DAILY. 30 tablet 11  . zolpidem (AMBIEN) 10 MG tablet Take 10 mg by mouth at bedtime as needed for sleep.     . traMADol (ULTRAM) 50 MG tablet Take 50-100 mg by mouth daily as needed for moderate pain.   0   No current facility-administered medications for this encounter.     Allergies  Allergen Reactions  . Actos [Pioglitazone Hydrochloride] Swelling and Other (See Comments)    Severe peripheral edema  . Codeine Other (See Comments)    Makes Pt aggressive  . Depakote [Divalproex Sodium] Diarrhea    severe  . Diltiazem Other (See Comments)    lethargy and dyspnea  .  Hydralazine Other (See Comments)    Severe chills and SOB   . Isosorbide Dinitrate Other (See Comments)    Severe chills and SOB   . Januvia [Sitagliptin] Diarrhea and Other (See Comments)    bradycardia  . Loop Diuretics Other (See Comments)    Spike in BUN and creatine levels  . Losartan Other (See Comments)    aphasia  . Nsaids Other (See Comments)    Renal problems  .  Onglyza [Saxagliptin] Diarrhea and Other (See Comments)    bradycardia  . Orudis [Ketoprofen] Other (See Comments)    Cannot take due to renal insufficiency  . Penicillins Swelling and Other (See Comments)    Serum sickness Has patient had a PCN reaction causing immediate rash, facial/tongue/throat swelling, SOB or lightheadedness with hypotension: Yes Has patient had a PCN reaction causing severe rash involving mucus membranes or skin necrosis: No Has patient had a PCN reaction that required hospitalization Yes Has patient had a PCN reaction occurring within the last 10 years: No If all of the above answers are "NO", then may proceed with Cephalosporin use.   [name] Potassium Iodide Itching and Rash    Severe entire body itching and rash  . Rozerem [Ramelteon] Diarrhea    Severe   . Amiodarone Other (See Comments)    diarrhea  . Latex Swelling  . Verapamil Other (See Comments)    Severe fatigue  . Benazepril Hcl Other (See Comments)    ? Possible lowers platelets  . Indomethacin Other (See Comments)    Renal problems   . Minocycline Other (See Comments)    Makes dizzy and felt just lousy.  [name] Pentazocine Lactate Other (See Comments)    Unknown allergic reaction - pt and wife do not recall this  . Poison Ivy Extract [Poison Ivy Extract] Rash and Other (See Comments)    blisters  . Sertraline Hcl Other (See Comments)    Pt and wife do not recall this  . Shellfish Allergy Rash      Social History   Social History  . Marital status: Married    Spouse name: N/A  . Number of children: 4  . Years of  education: N/A   Occupational History  . retired Administrator, [employer]     due to coronary disease in 2000  . 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 Smoking status: Former Smoker    Years: 28.00    Types: Pipe, Cigars    Quit date: 12/25/1995  . Smokeless tobacco: Never Used  . Alcohol use Yes     Comment: 3-5 beer or wine per week  . Drug use: No  . Sexual activity: Not on file   Other Topics Concern  . Not on file   Social History Narrative  . No narrative on file      Family History  Problem Relation Age of Onset  . Heart attack Father   . Stroke Father   . Stroke Mother   . Allergies    . Asthma    . Heart disease    . Cancer      Vitals:   01/30/17 1516  BP: 122/68  Pulse: 63  SpO2: 97%  Weight: 226 lb (102.5 kg)   Wt Readings from Last 3 Encounters:  01/30/17 226 lb (102.5 kg)  12/10/16 220 lb (99.8 kg)  11/29/16 236 lb 12.8 oz (107.4 kg)    PHYSICAL EXAM: General:  NAD. No respiratory difficulty HEENT: normal Neck: supple. JVP 8 with prominent cv wave. Carotids 2+ bilat; no bruits. No lymphadenopathy or thyromegaly appreciated. Cor: PMI nondisplaced. Irregularly irregular. 2/6 MR 2/6 TR Lungs: CTAB, normal effort Abdomen: Obese, non-tender, +distended.  No bruits or masses. +BS Extremities: no cyanosis, clubbing, rash. Trace -1+  edema.. Left leg with trace to no edema left leg. Wound healed.  Neuro: alert & oriented x 3, cranial nerves grossly intact. moves all 4 extremities  w/o difficulty. Affect pleasant.   ASSESSMENT & PLAN:  1. Chronic combined CHF with R >> L symptoms : Echo 7/17 LVEF 40-45% RV severely HK. RVSP 55m HG bioprosthetic AV and s/p tricuspid valve repair --Volume status looks pretty good though he does have recurrent [name]. NYHA [name]-III --Continue current diuretic regimen.  --Reviewed results of [facility] --Can repeat paracentesis as needed 2. CAD status post CABG 2006 --Cath in 2012 with all  grafts patent but severe disease in native vessels in OM and LAD territories 3. Aortic stenosis and tricuspid regurgitation status post bioprosthetic AVR 2012 and tricuspid valve repair in 05/2011 4. Chronic atrial fibrillation - AT/AF burden > 99% by pacemaker interrogation. - Continue Xarelto 5. CKD Stage IV  --Creatinine stable 3.1-3.5 Followed by Dr. CMarval Regal Planning eventual PD 6. DM 2  7. Anxiety  8. Cardiac cirrhosis with [name] -he seems to have progressive cirrhotic symptoms. Bilirubin recently elevated. Also with splenic sequestration of platelets.  9. OHS/OSA - On BiPAP   Kassey Laforest,[name] 3:21 PM

## 2017-02-01 ENCOUNTER — Other Ambulatory Visit: Payer: Self-pay | Admitting: *Deleted

## 2017-02-01 MED ORDER — RIVAROXABAN 15 MG PO TABS: 15.0000 mg | ORAL_TABLET | Freq: Every day | ORAL | 5 refills | Status: DC

## 2017-02-04 ENCOUNTER — Encounter: Payer: Self-pay | Admitting: Oncology

## 2017-02-04 ENCOUNTER — Telehealth: Payer: Self-pay | Admitting: Oncology

## 2017-02-04 NOTE — Telephone Encounter (Signed)
Appt has been scheduled w/Annasha at [facility] for the pt to see Dr. Alen Blew on 3/20 at 11am. Will mail a letter to the pt about the appt date and time.

## 2017-02-04 NOTE — Telephone Encounter (Signed)
Pt cld wanting to reschedule appt in May. Says "I want to schedule after the flu pandemic is over". Appt has been rescheduled for the pt to see Dr. Alen Blew on 5/15 at 2pm. Will mail a letter and directions to the pt.

## 2017-03-02 ENCOUNTER — Encounter (HOSPITAL_COMMUNITY): Payer: Self-pay | Admitting: Emergency Medicine

## 2017-03-02 DIAGNOSIS — Z96651 Presence of right artificial knee joint: Secondary | ICD-10-CM | POA: Insufficient documentation

## 2017-03-02 DIAGNOSIS — Z87891 Personal history of nicotine dependence: Secondary | ICD-10-CM | POA: Diagnosis not present

## 2017-03-02 DIAGNOSIS — Z794 Long term (current) use of insulin: Secondary | ICD-10-CM | POA: Diagnosis not present

## 2017-03-02 DIAGNOSIS — Z7901 Long term (current) use of anticoagulants: Secondary | ICD-10-CM | POA: Diagnosis not present

## 2017-03-02 DIAGNOSIS — W228XXA Striking against or struck by other objects, initial encounter: Secondary | ICD-10-CM | POA: Diagnosis not present

## 2017-03-02 DIAGNOSIS — Z950 Presence of cardiac pacemaker: Secondary | ICD-10-CM | POA: Insufficient documentation

## 2017-03-02 DIAGNOSIS — E1122 Type 2 diabetes mellitus with diabetic chronic kidney disease: Secondary | ICD-10-CM | POA: Diagnosis not present

## 2017-03-02 DIAGNOSIS — Z91040 Latex allergy status: Secondary | ICD-10-CM | POA: Diagnosis not present

## 2017-03-02 DIAGNOSIS — Y999 Unspecified external cause status: Secondary | ICD-10-CM | POA: Insufficient documentation

## 2017-03-02 DIAGNOSIS — I130 Hypertensive heart and chronic kidney disease with heart failure and stage 1 through stage 4 chronic kidney disease, or unspecified chronic kidney disease: Secondary | ICD-10-CM | POA: Diagnosis not present

## 2017-03-02 DIAGNOSIS — N183 Chronic kidney disease, stage 3 (moderate): Secondary | ICD-10-CM | POA: Diagnosis not present

## 2017-03-02 DIAGNOSIS — I252 Old myocardial infarction: Secondary | ICD-10-CM | POA: Insufficient documentation

## 2017-03-02 DIAGNOSIS — S0101XA Laceration without foreign body of scalp, initial encounter: Secondary | ICD-10-CM | POA: Diagnosis not present

## 2017-03-02 DIAGNOSIS — Y939 Activity, unspecified: Secondary | ICD-10-CM | POA: Diagnosis not present

## 2017-03-02 DIAGNOSIS — R930 Abnormal findings on diagnostic imaging of skull and head, not elsewhere classified: Secondary | ICD-10-CM | POA: Diagnosis not present

## 2017-03-02 DIAGNOSIS — Z951 Presence of aortocoronary bypass graft: Secondary | ICD-10-CM | POA: Diagnosis not present

## 2017-03-02 DIAGNOSIS — I5033 Acute on chronic diastolic (congestive) heart failure: Secondary | ICD-10-CM | POA: Insufficient documentation

## 2017-03-02 DIAGNOSIS — Y929 Unspecified place or not applicable: Secondary | ICD-10-CM | POA: Insufficient documentation

## 2017-03-02 NOTE — ED Notes (Addendum)
Pt states he fell while taking a shower earlier tonight, no loss of cx, and is on xarelto.

## 2017-03-02 NOTE — ED Triage Notes (Addendum)
Pt reports falling in shower at appx 2030 tonight and hitting head on shampoo holder. Pt denies any LOC after fall. 2.5 cm laceration to superior portion of head. Oozing of blood still present. Pt reports taking Xarelto 15mg  at 1830 tonight.

## 2017-03-03 ENCOUNTER — Emergency Department (HOSPITAL_COMMUNITY): Payer: Medicare Other

## 2017-03-03 ENCOUNTER — Emergency Department (HOSPITAL_COMMUNITY)
Admission: EM | Admit: 2017-03-03 | Discharge: 2017-03-03 | Disposition: A | Discharge status: Home / Self Care | Payer: Medicare Other | Attending: Emergency Medicine | Admitting: Emergency Medicine

## 2017-03-03 DIAGNOSIS — S0101XA Laceration without foreign body of scalp, initial encounter: Secondary | ICD-10-CM | POA: Diagnosis not present

## 2017-03-03 MED ORDER — LIDOCAINE-EPINEPHRINE-TETRACAINE (LET) SOLUTION
3.0000 mL | Freq: Once | NASAL | Status: AC
  Administered 2017-03-03: 3 mL via TOPICAL
  Filled 2017-03-03: qty 3

## 2017-03-03 MED ORDER — ACETAMINOPHEN 325 MG PO TABS
650.0000 mg | ORAL_TABLET | Freq: Once | ORAL | Status: AC
  Administered 2017-03-03: 650 mg via ORAL
  Filled 2017-03-03: qty 2

## 2017-03-03 MED ORDER — LIDOCAINE-EPINEPHRINE (PF) 2 %-1:200000 IJ SOLN
20.0000 mL | Freq: Once | INTRAMUSCULAR | Status: AC
  Administered 2017-03-03: 20 mL
  Filled 2017-03-03: qty 20

## 2017-03-03 NOTE — ED Provider Notes (Signed)
[facility] DEPT Provider Note   CSN: [id number] Arrival date & time: 03/02/17  2332  By signing my name below, I, Margit Banda, attest that this documentation has been prepared under the direction and in the presence of Orpah Greek, MD. Electronically Signed: Margit Banda, ED Scribe. 03/03/17. 1:25 AM.    History   Chief Complaint Chief Complaint  Patient presents with  . 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 Pt states he fell and struck his head on a shower shelf. Pt received a wound to his right scalp and bruising under his chin. He denies LOC. He reports associated HA. Pt is currently on anticoagulants which has caused him to continuously bleed since 8:15 pm, per wife. Pt denies taking any medication for pain PTA.  Pt denies leg pain, wrist pain, elbow pain, hip pain, knee pain, neck pain, additional injuries.   The history is provided by the patient. No language interpreter was used.    Past Medical History:  Diagnosis Date  . A-fib ([place])    permanent with tachy-brady syndrome  . Anemia   . Anxiety   . Atrial flutter ([facility])    hx of  . Biceps tendon tear    right  . Bone marrow disease   . Cancer ([place])    basal cell  . CHF (congestive heart failure) ([place])   . Chronic kidney disease (CKD), stage III (moderate)    lov note dr Koleen Nimrod nephrology 09-17-13 on chart  . Complication of anesthesia 11-16-13   required alot of versed per anesthesia with cataract surgery  . Coronary heart disease    stent, CABG, RBBB  . DDD (degenerative disc disease)   . Diabetes ([place])   . Diabetes mellitus   . Fungal toenail infection   . Gout   . Hemorrhagic cystitis 2012  . Hepatitis    mono  . HTN (hypertension)   . Hypercalcemia 2011   due to sarcoidiosis  . Myocardial infarction    1995, 2002, 2006  .  OSA (obstructive sleep apnea)    use bipap setting of 10 and 12  . Osteoarthritis   . Pneumonia 1966, 2011   hx of  . Presence of permanent cardiac pacemaker   . Right sciatic nerve pain    for block thursday 01-21-2014, injections  . Sarcoid ([place]) 2011   pulmonary and bone marrow  . Shortness of breath dyspnea   . Synovial cyst of lumbar spine    l3-l4, l4-l5, injections  . Valvular heart disease    aortic stenosis/regurgitation    Patient Active Problem List   Diagnosis Date Noted  . Chronic systolic CHF (congestive heart failure) ([place]) 09/26/2016  . Right heart failure 06/26/2016  . Leg wound, left 06/26/2016  . Chronic diastolic CHF (congestive heart failure) ([place]) 03/22/2016  . Ascites 12/02/2015  . Diarrhea 12/02/2015  . Leg cramps 12/02/2015  . Acute on chronic diastolic CHF (congestive heart failure), NYHA class 1 ([place])   . Diastolic dysfunction 53/97/6734  . Dyspnea   . CHF (congestive heart failure) ([place]) 09/06/2015  . CAD (coronary artery disease) 05/11/2015  . S/P placement of cardiac pacemaker 05/10/2015  . Acute on chronic diastolic CHF (congestive heart failure) ([place]) 05/10/2015  . Cardiac device in situ   . Sick sinus syndrome ([place]) 05/05/2015  .  Acute on chronic renal failure ([place]) 03/23/2015  . Bradycardia, sinus, persistent, severe 03/23/2015  . Acute hyponatremia 03/23/2015  . Hyperkalemia 03/23/2015  . Rib pain on right side 03/23/2015  . Poor appetite 03/23/2015  . Acute gastrointestinal bleeding 03/23/2015  . Nausea & vomiting 03/23/2015  . H/O aortic valve replacement with tissue graft 12/09/2014  . Acute posthemorrhagic anemia 06/01/2014  . UTI (urinary tract infection) 05/31/2014  . Anxiety 05/31/2014  . Insomnia 05/31/2014  . Weakness 05/26/2014  . Fever 05/26/2014  . OA (osteoarthritis) of knee 05/24/2014  . Cataract 09/02/2013  . Osteoarthritis 09/02/2013  . Atrial flutter ([place]) 08/20/2012  . Back pain 08/20/2012  . Severe tricuspid  regurgitation by prior echocardiogram 08/14/2011  . Aortic valve replaced 08/14/2011  . Chronic atrial fibrillation ([place]) 07/05/2011  . Chronic kidney disease (CKD), stage III (moderate)   . Hx of CABG 04/26/2011  . Diabetes ([place]) 04/26/2011  . Gout 04/26/2011  . BPH (benign prostatic hyperplasia) 04/26/2011  . Essential hypertension 04/26/2011  . MYOCARDIAL INFARCTION 11/27/2010  . Ischemic heart disease 11/27/2010  . Valvular heart disease 11/27/2010  . Sarcoidosis ([place]) 11/24/2010  . Obstructive sleep apnea 11/24/2010  . Aortic valve disorder 11/24/2010  . DYSPNEA 11/24/2010    Past Surgical History:  Procedure Laterality Date  . BASAL CELL CARCINOMA EXCISION Right 2015   ear  . CARDIAC CATHETERIZATION N/A 12/10/2016   Procedure: Right Heart Cath;  Surgeon: Jolaine Artist, MD;  Location: [facility];  Service: Cardiovascular;  Laterality: N/A;  . CARDIOVERSION N/A 12/30/2013   Procedure: CARDIOVERSION;  Surgeon: [name], MD;  Location: [facility] ENDOSCOPY;  Service: Cardiovascular;  Laterality: N/A;  10:49 cardioversion at 120 joules, then 150 joules, to SB  used  Lido 82m,  Propofol 160 mcg  . CARDIOVERSION  2003, 2006, 2012, 2013  . cataract surgery Right 2007  . cataract surgery Left 11-16-13  . COLONOSCOPY N/A 01/29/2014   Procedure: COLONOSCOPY;  Surgeon: JWinfield Cunas, MD;  [facility];  Service: Endoscopy;  Laterality: N/A;  amanda//ja  . CORONARY ARTERY BYPASS GRAFT  2006   x 5  . EP IMPLANTABLE DEVICE N/A 05/05/2015   Procedure: Pacemaker Implant;  Surgeon: JThompson Grayer MD;  [facility];  Service: Cardiovascular;  Laterality: N/A;  . FLEXIBLE SIGMOIDOSCOPY N/A 12/07/2015   Procedure: FLEXIBLE SIGMOIDOSCOPY;  Surgeon: VWilford Corner MD;  [facility];  Service: Endoscopy;  Laterality: N/A;  Unprepped  . INSERT / REPLACE / REMOVE PACEMAKER  05/05/2015  . PORTACATH PLACEMENT    . portacath removed    . Redo  Median sternotomy, extracorporeal cirulation, AVR, Tricuspid valve repair  06/13/2011   AVR(23-mm Edwards pericardial Magna-Ease valve./ TVrepair (34-mm Edwards MC3 annuloplasty ring  . TOTAL KNEE ARTHROPLASTY Right 05/24/2014   Procedure: RIGHT TOTAL KNEE ARTHROPLASTY;  Surgeon: FGearlean Alf MD;  Location: [facility];  Service: Orthopedics;  Laterality: Right;  . TRANSURETHRAL RESECTION OF PROSTATE  oct. 2012   TURP  . VASECTOMY  1977       Home Medications    Prior to Admission medications   Medication Sig Start Date End Date Taking? Authorizing Provider  acetaminophen (TYLENOL) 500 MG tablet Take 1,000 mg by mouth 2 (two) times daily as needed (pain).    Yes Historical Provider, MD  allopurinol (ZYLOPRIM) 100 MG tablet Take 200 mg by mouth daily with breakfast.  08/06/11  Yes TDarlin Coco MD  cetirizine (ZYRTEC) 10 MG tablet Take 10 mg  by mouth daily as needed for allergies.    Yes Historical Provider, MD  diazepam (VALIUM) 10 MG tablet Take 10 mg by mouth daily as needed (ankle and leg cramps).  04/29/15  Yes Historical Provider, MD  dicyclomine (BENTYL) 10 MG capsule Take 10 mg by mouth every morning.    Yes Historical Provider, MD  diphenoxylate-atropine (LOMOTIL) 2.5-0.025 MG per tablet Take 2 tablets by mouth 2 (two) times daily as needed for diarrhea or loose stools. stomach 04/06/15  Yes Historical Provider, MD  insulin glargine (LANTUS) 100 unit/mL SOPN Inject 2-6 Units into the skin daily as needed (CBG >120). CBG 120-140 2 units, 141-160 3 units, 161-180 4 units, 181-200 5 units, 201-220 6 units   Yes Historical Provider, MD  metolazone (ZAROXOLYN) 2.5 MG tablet Take 2.5 mg (1 tab) once weekly on Fridays AS NEEDED (weight gain 3 lbs overnight or 5 lbs in 1 week). Take with 20 meq potassium. 11/29/16  Yes Jolaine Artist, MD  metoprolol (LOPRESSOR) 50 MG tablet Take 1 tablet (50 mg total) by mouth 2 (two) times daily. 09/26/16  Yes Thayer Headings, MD  Rivaroxaban (XARELTO) 15 MG  TABS tablet Take 1 tablet (15 mg total) by mouth daily with supper. 02/01/17  Yes Jolaine Artist, MD  terazosin (HYTRIN) 5 MG capsule Take 1 capsule (5 mg total) by mouth every morning. 01/08/17  Yes Thompson Grayer, MD  Testosterone Cypionate 200 MG/ML KIT Inject 1 mL into the muscle every 14 (fourteen) days.   Yes Historical Provider, MD  torsemide (DEMADEX) 20 MG tablet Take 40 mg by mouth every morning.    Yes Historical Provider, MD  zolpidem (AMBIEN) 10 MG tablet Take 10 mg by mouth at bedtime as needed for sleep.    Yes Historical Provider, MD    Family History Family History  Problem Relation Age of Onset  . Heart attack Father   . Stroke Father   . Stroke Mother   . Allergies    . Asthma    . Heart disease    . Cancer      Social History Social History  Substance Use Topics  . Smoking status: Former Smoker    Years: 28.00    Types: Pipe, Cigars    Quit date: 12/25/1995  . Smokeless tobacco: Never Used  . Alcohol use Yes     Comment: 3-5 beer or wine per week     Allergies   Actos [pioglitazone hydrochloride]; Codeine; Depakote [divalproex sodium]; Diltiazem; Hydralazine; Isosorbide dinitrate; Januvia [sitagliptin]; Loop diuretics; Losartan; Nsaids; Onglyza [saxagliptin]; Orudis [ketoprofen]; Penicillins; Potassium iodide; Rozerem [ramelteon]; Amiodarone; Latex; Verapamil; Benazepril hcl; Indomethacin; Minocycline; Pentazocine lactate; Poison ivy extract [poison ivy extract]; Sertraline hcl; and Shellfish allergy   Review of Systems Review of Systems  Musculoskeletal: Negative for arthralgias, myalgias and neck pain.  Skin: Positive for color change (bruising) and wound.  Neurological: Positive for headaches. Negative for syncope.  All other systems reviewed and are negative.    Physical Exam Updated Vital Signs BP 165/84 (BP Location: Left Arm)   Pulse 70   Temp 97.8 F (36.6 C) (Oral)   Resp 18   Ht _0  (1.803 m)   Wt 220 lb (99.8 kg)   SpO2 96%   BMI  30.68 kg/m   Physical Exam  Constitutional: He is oriented to person, place, and time. He appears well-developed and well-nourished. No distress.  HENT:  Head: Normocephalic.  Right Ear: Hearing normal.  Left Ear: Hearing normal.  Nose: Nose normal.  Mouth/Throat: Oropharynx is clear and moist and mucous membranes are normal.  Contusion and 2cm laceration to right vertex/parietal  scalp  Eyes: Conjunctivae and EOM are normal. Pupils are equal, round, and reactive to light.  Neck: Normal range of motion. Neck supple.  Cardiovascular: Regular rhythm, S1 normal and S2 normal.  Exam reveals no gallop and no friction rub.   No murmur heard. Pulmonary/Chest: Effort normal and breath sounds normal. No respiratory distress. He exhibits no tenderness.  Abdominal: Soft. Normal appearance and bowel sounds are normal. There is no hepatosplenomegaly. There is no tenderness. There is no rebound, no guarding, no tenderness at McBurney's point and negative Murphy's sign. No hernia.  Musculoskeletal: Normal range of motion.  Neurological: He is alert and oriented to person, place, and time. He has normal strength. No cranial nerve deficit or sensory deficit. Coordination normal. GCS eye subscore is 4. GCS verbal subscore is 5. GCS motor subscore is 6.  Skin: Skin is warm, dry and intact. No rash noted. No cyanosis.  Psychiatric: He has a normal mood and affect. His speech is normal and behavior is normal. Thought content normal.  Nursing note and vitals reviewed.    ED Treatments / Results  DIAGNOSTIC STUDIES: Oxygen Saturation is 96% on RA, adequate by my interpretation.   COORDINATION OF CARE: 12:13 AM-Discussed next steps with pt which include CT head. Pt verbalized understanding and is agreeable with the plan.    Labs (all labs ordered are listed, but only abnormal results are displayed) Labs Reviewed - No data to display  EKG  EKG Interpretation None       Radiology No results  found.  Procedures .Marland KitchenLaceration Repair Date/Time: 03/03/2017 1:28 AM Performed by: Orpah Greek Authorized by: Orpah Greek   Consent:    Consent obtained:  Verbal   Consent given by:  Patient   Risks discussed:  Pain Universal protocol:    Procedure explained and questions answered to patient or proxy's satisfaction: yes     Relevant documents present and verified: yes     Test results available and properly labeled: yes     Imaging studies available: yes     Site/side marked: yes     Immediately prior to procedure, a time out was called: yes     Patient identity confirmed:  Verbally with patient Anesthesia (see MAR for exact dosages):    Anesthesia method:  Local infiltration   Local anesthetic:  Lidocaine 2% WITH epi Laceration details:    Location:  Scalp   Length (cm):  2 Repair type:    Repair type:  Simple Pre-procedure details:    Preparation:  Patient was prepped and draped in usual sterile fashion and imaging obtained to evaluate for foreign bodies Exploration:    Hemostasis achieved with:  Epinephrine   Contaminated: no   Treatment:    Area cleansed with:  Saline   Amount of cleaning:  Standard   Irrigation solution:  Sterile saline   Irrigation method:  Syringe   Visualized foreign bodies/material removed: no   Skin repair:    Repair method:  Staples   Number of staples:  5 Approximation:    Approximation:  Close   Vermilion border: well-aligned     (including critical care time)  Medications Ordered in ED Medications  lidocaine-EPINEPHrine (XYLOCAINE W/EPI) 2 %-1:200000 (PF) injection 20 mL (not administered)  lidocaine-EPINEPHrine-tetracaine (LET) solution (3 mLs Topical Given 03/03/17 0025)     Initial Impression /  Assessment and Plan / ED Course  I have reviewed the triage vital signs and the nursing notes.  Pertinent labs & imaging results that were available during my care of the patient were reviewed by me and considered  in my medical decision making (see chart for details).     Patient presents to the emergency department for evaluation of head injury. Patient lost his balance in the shower, struck his scalp. Patient did have a laceration that required repair. This was performed as outlined above. CT head performed because patient is on Xarelto.  Final Clinical Impressions(s) / ED Diagnoses   Final diagnoses:  Scalp laceration, initial encounter    New Prescriptions New Prescriptions   No medications on file    I personally performed the services described in this documentation, which was scribed in my presence. The recorded information has been reviewed and is accurate.     Orpah Greek, MD 03/03/17 0130

## 2017-03-03 NOTE — Discharge Instructions (Signed)
Staple removal in 10 days.

## 2017-03-04 ENCOUNTER — Telehealth (HOSPITAL_COMMUNITY): Payer: Self-pay | Admitting: *Deleted

## 2017-03-04 DIAGNOSIS — R188 Other ascites: Secondary | ICD-10-CM

## 2017-03-04 NOTE — Telephone Encounter (Signed)
Pt left a VM on triage line earlier requesting a paracentesis on Wed or Fri this week due to abd distention.  Per Dr [name]'s last OV note: Can repeat paracentesis as needed  Paracentesis sch for Wed 3/14 at 10 am, pt's wife aware

## 2017-03-06 ENCOUNTER — Ambulatory Visit (HOSPITAL_COMMUNITY)
Admission: RE | Admit: 2017-03-06 | Discharge: 2017-03-06 | Disposition: A | Discharge status: Home / Self Care | Payer: Medicare Other | Source: Ambulatory Visit | Attending: Internal Medicine | Admitting: Internal Medicine

## 2017-03-06 DIAGNOSIS — R188 Other ascites: Secondary | ICD-10-CM | POA: Insufficient documentation

## 2017-03-06 MED ORDER — LIDOCAINE HCL 1 % IJ SOLN
INTRAMUSCULAR | Status: AC
  Filled 2017-03-06: qty 20

## 2017-03-06 NOTE — Procedures (Signed)
PROCEDURE SUMMARY:  Successful US guided paracentesis from left lateral abdomen.  Yielded 5.0 liters of amber fluid.  No immediate complications.  Pt tolerated well.   Specimen was not sent for labs.  Docia Barrier PA-C 03/06/2017 12:05 PM

## 2017-03-12 ENCOUNTER — Ambulatory Visit: Payer: Medicare Other | Admitting: Oncology

## 2017-03-13 ENCOUNTER — Other Ambulatory Visit: Payer: Self-pay | Admitting: Family Medicine

## 2017-03-13 DIAGNOSIS — S0990XA Unspecified injury of head, initial encounter: Secondary | ICD-10-CM

## 2017-03-14 ENCOUNTER — Ambulatory Visit
Admission: RE | Admit: 2017-03-14 | Discharge: 2017-03-14 | Disposition: A | Discharge status: Home / Self Care | Payer: Medicare Other | Source: Ambulatory Visit | Attending: Family Medicine | Admitting: Family Medicine

## 2017-03-14 DIAGNOSIS — S0990XA Unspecified injury of head, initial encounter: Secondary | ICD-10-CM

## 2017-03-15 ENCOUNTER — Other Ambulatory Visit (HOSPITAL_COMMUNITY): Payer: Self-pay | Admitting: Internal Medicine

## 2017-03-18 ENCOUNTER — Other Ambulatory Visit: Payer: Self-pay | Admitting: Vascular Surgery

## 2017-03-18 DIAGNOSIS — N184 Chronic kidney disease, stage 4 (severe): Secondary | ICD-10-CM

## 2017-03-26 ENCOUNTER — Other Ambulatory Visit (HOSPITAL_COMMUNITY): Payer: Self-pay | Admitting: Internal Medicine

## 2017-04-08 ENCOUNTER — Encounter (HOSPITAL_COMMUNITY): Payer: Self-pay | Admitting: Nurse Practitioner

## 2017-04-08 ENCOUNTER — Inpatient Hospital Stay (HOSPITAL_COMMUNITY)
Admission: EM | Admit: 2017-04-08 | Discharge: 2017-04-16 | DRG: 441 | Disposition: A | Discharge status: Rehabilitation Facility | Payer: Medicare Other | Attending: Family Medicine | Admitting: Family Medicine

## 2017-04-08 DIAGNOSIS — E871 Hypo-osmolality and hyponatremia: Secondary | ICD-10-CM | POA: Diagnosis present

## 2017-04-08 DIAGNOSIS — Z950 Presence of cardiac pacemaker: Secondary | ICD-10-CM | POA: Diagnosis not present

## 2017-04-08 DIAGNOSIS — K7682 Hepatic encephalopathy: Secondary | ICD-10-CM

## 2017-04-08 DIAGNOSIS — R188 Other ascites: Secondary | ICD-10-CM | POA: Diagnosis present

## 2017-04-08 DIAGNOSIS — N289 Disorder of kidney and ureter, unspecified: Secondary | ICD-10-CM

## 2017-04-08 DIAGNOSIS — K7200 Acute and subacute hepatic failure without coma: Principal | ICD-10-CM | POA: Diagnosis present

## 2017-04-08 DIAGNOSIS — Z79899 Other long term (current) drug therapy: Secondary | ICD-10-CM | POA: Diagnosis not present

## 2017-04-08 DIAGNOSIS — D696 Thrombocytopenia, unspecified: Secondary | ICD-10-CM | POA: Diagnosis present

## 2017-04-08 DIAGNOSIS — N184 Chronic kidney disease, stage 4 (severe): Secondary | ICD-10-CM | POA: Diagnosis not present

## 2017-04-08 DIAGNOSIS — K761 Chronic passive congestion of liver: Secondary | ICD-10-CM | POA: Diagnosis present

## 2017-04-08 DIAGNOSIS — Z8701 Personal history of pneumonia (recurrent): Secondary | ICD-10-CM

## 2017-04-08 DIAGNOSIS — Z955 Presence of coronary angioplasty implant and graft: Secondary | ICD-10-CM | POA: Diagnosis not present

## 2017-04-08 DIAGNOSIS — E1169 Type 2 diabetes mellitus with other specified complication: Secondary | ICD-10-CM | POA: Diagnosis not present

## 2017-04-08 DIAGNOSIS — N4889 Other specified disorders of penis: Secondary | ICD-10-CM | POA: Diagnosis present

## 2017-04-08 DIAGNOSIS — I482 Chronic atrial fibrillation, unspecified: Secondary | ICD-10-CM | POA: Diagnosis present

## 2017-04-08 DIAGNOSIS — Z7901 Long term (current) use of anticoagulants: Secondary | ICD-10-CM | POA: Diagnosis not present

## 2017-04-08 DIAGNOSIS — I2510 Atherosclerotic heart disease of native coronary artery without angina pectoris: Secondary | ICD-10-CM | POA: Diagnosis present

## 2017-04-08 DIAGNOSIS — R4189 Other symptoms and signs involving cognitive functions and awareness: Secondary | ICD-10-CM | POA: Diagnosis not present

## 2017-04-08 DIAGNOSIS — I259 Chronic ischemic heart disease, unspecified: Secondary | ICD-10-CM | POA: Diagnosis present

## 2017-04-08 DIAGNOSIS — E1122 Type 2 diabetes mellitus with diabetic chronic kidney disease: Secondary | ICD-10-CM | POA: Diagnosis not present

## 2017-04-08 DIAGNOSIS — E669 Obesity, unspecified: Secondary | ICD-10-CM | POA: Diagnosis present

## 2017-04-08 DIAGNOSIS — Z952 Presence of prosthetic heart valve: Secondary | ICD-10-CM | POA: Diagnosis not present

## 2017-04-08 DIAGNOSIS — S60922A Unspecified superficial injury of left hand, initial encounter: Secondary | ICD-10-CM | POA: Diagnosis present

## 2017-04-08 DIAGNOSIS — I132 Hypertensive heart and chronic kidney disease with heart failure and with stage 5 chronic kidney disease, or end stage renal disease: Secondary | ICD-10-CM | POA: Diagnosis present

## 2017-04-08 DIAGNOSIS — G9341 Metabolic encephalopathy: Secondary | ICD-10-CM

## 2017-04-08 DIAGNOSIS — G4733 Obstructive sleep apnea (adult) (pediatric): Secondary | ICD-10-CM | POA: Diagnosis present

## 2017-04-08 DIAGNOSIS — N189 Chronic kidney disease, unspecified: Secondary | ICD-10-CM

## 2017-04-08 DIAGNOSIS — I509 Heart failure, unspecified: Secondary | ICD-10-CM

## 2017-04-08 DIAGNOSIS — Z992 Dependence on renal dialysis: Secondary | ICD-10-CM

## 2017-04-08 DIAGNOSIS — Z87891 Personal history of nicotine dependence: Secondary | ICD-10-CM | POA: Diagnosis not present

## 2017-04-08 DIAGNOSIS — I5082 Biventricular heart failure: Secondary | ICD-10-CM | POA: Diagnosis present

## 2017-04-08 DIAGNOSIS — R109 Unspecified abdominal pain: Secondary | ICD-10-CM | POA: Diagnosis present

## 2017-04-08 DIAGNOSIS — N5089 Other specified disorders of the male genital organs: Secondary | ICD-10-CM | POA: Diagnosis not present

## 2017-04-08 DIAGNOSIS — K7290 Hepatic failure, unspecified without coma: Secondary | ICD-10-CM | POA: Diagnosis not present

## 2017-04-08 DIAGNOSIS — R410 Disorientation, unspecified: Secondary | ICD-10-CM | POA: Diagnosis present

## 2017-04-08 DIAGNOSIS — I5042 Chronic combined systolic (congestive) and diastolic (congestive) heart failure: Secondary | ICD-10-CM

## 2017-04-08 DIAGNOSIS — E119 Type 2 diabetes mellitus without complications: Secondary | ICD-10-CM | POA: Diagnosis not present

## 2017-04-08 DIAGNOSIS — I5032 Chronic diastolic (congestive) heart failure: Secondary | ICD-10-CM | POA: Diagnosis not present

## 2017-04-08 DIAGNOSIS — Z951 Presence of aortocoronary bypass graft: Secondary | ICD-10-CM | POA: Diagnosis not present

## 2017-04-08 DIAGNOSIS — Z6832 Body mass index (BMI) 32.0-32.9, adult: Secondary | ICD-10-CM

## 2017-04-08 DIAGNOSIS — Z85828 Personal history of other malignant neoplasm of skin: Secondary | ICD-10-CM

## 2017-04-08 DIAGNOSIS — M109 Gout, unspecified: Secondary | ICD-10-CM | POA: Diagnosis present

## 2017-04-08 DIAGNOSIS — N186 End stage renal disease: Secondary | ICD-10-CM | POA: Diagnosis not present

## 2017-04-08 DIAGNOSIS — N19 Unspecified kidney failure: Secondary | ICD-10-CM

## 2017-04-08 DIAGNOSIS — I10 Essential (primary) hypertension: Secondary | ICD-10-CM | POA: Diagnosis present

## 2017-04-08 DIAGNOSIS — R5381 Other malaise: Secondary | ICD-10-CM | POA: Diagnosis present

## 2017-04-08 DIAGNOSIS — Z8249 Family history of ischemic heart disease and other diseases of the circulatory system: Secondary | ICD-10-CM

## 2017-04-08 DIAGNOSIS — I252 Old myocardial infarction: Secondary | ICD-10-CM

## 2017-04-08 DIAGNOSIS — F419 Anxiety disorder, unspecified: Secondary | ICD-10-CM | POA: Diagnosis not present

## 2017-04-08 DIAGNOSIS — E7220 Disorder of urea cycle metabolism, unspecified: Secondary | ICD-10-CM | POA: Diagnosis not present

## 2017-04-08 DIAGNOSIS — Z9889 Other specified postprocedural states: Secondary | ICD-10-CM

## 2017-04-08 DIAGNOSIS — Z953 Presence of xenogenic heart valve: Secondary | ICD-10-CM | POA: Diagnosis not present

## 2017-04-08 DIAGNOSIS — D631 Anemia in chronic kidney disease: Secondary | ICD-10-CM | POA: Diagnosis present

## 2017-04-08 DIAGNOSIS — Z96651 Presence of right artificial knee joint: Secondary | ICD-10-CM | POA: Diagnosis not present

## 2017-04-08 DIAGNOSIS — Z794 Long term (current) use of insulin: Secondary | ICD-10-CM | POA: Diagnosis not present

## 2017-04-08 DIAGNOSIS — Z8719 Personal history of other diseases of the digestive system: Secondary | ICD-10-CM

## 2017-04-08 DIAGNOSIS — E876 Hypokalemia: Secondary | ICD-10-CM | POA: Diagnosis not present

## 2017-04-08 DIAGNOSIS — D638 Anemia in other chronic diseases classified elsewhere: Secondary | ICD-10-CM | POA: Diagnosis not present

## 2017-04-08 DIAGNOSIS — E46 Unspecified protein-calorie malnutrition: Secondary | ICD-10-CM | POA: Diagnosis not present

## 2017-04-08 DIAGNOSIS — X58XXXA Exposure to other specified factors, initial encounter: Secondary | ICD-10-CM | POA: Diagnosis present

## 2017-04-08 DIAGNOSIS — R52 Pain, unspecified: Secondary | ICD-10-CM | POA: Diagnosis not present

## 2017-04-08 DIAGNOSIS — I4891 Unspecified atrial fibrillation: Secondary | ICD-10-CM | POA: Diagnosis not present

## 2017-04-08 DIAGNOSIS — R791 Abnormal coagulation profile: Secondary | ICD-10-CM | POA: Diagnosis not present

## 2017-04-08 LAB — COMPREHENSIVE METABOLIC PANEL
ALT: 9 U/L — ABNORMAL LOW (ref 17–63)
ANION GAP: 13 (ref 5–15)
AST: 20 U/L (ref 15–41)
Albumin: 3.3 g/dL — ABNORMAL LOW (ref 3.5–5.0)
Alkaline Phosphatase: 72 U/L (ref 38–126)
BUN: 133 mg/dL — ABNORMAL HIGH (ref 6–20)
CHLORIDE: 91 mmol/L — AB (ref 101–111)
CO2: 25 mmol/L (ref 22–32)
Calcium: 9.3 mg/dL (ref 8.9–10.3)
Creatinine, Ser: 4.33 mg/dL — ABNORMAL HIGH (ref 0.61–1.24)
GFR calc Af Amer: 14 mL/min — ABNORMAL LOW (ref >60)
GFR, EST NON AFRICAN AMERICAN: 12 mL/min — AB (ref >60)
Glucose, Bld: 150 mg/dL — ABNORMAL HIGH (ref 65–99)
POTASSIUM: 4.2 mmol/L (ref 3.5–5.1)
Sodium: 129 mmol/L — ABNORMAL LOW (ref 135–145)
TOTAL PROTEIN: 6 g/dL — AB (ref 6.5–8.1)
Total Bilirubin: 1.6 mg/dL — ABNORMAL HIGH (ref 0.3–1.2)

## 2017-04-08 LAB — PROTIME-INR
INR: 2
Prothrombin Time: 22.9 seconds — ABNORMAL HIGH (ref 11.4–15.2)

## 2017-04-08 LAB — CBC WITH DIFFERENTIAL/PLATELET
BASOS ABS: 0 10*3/uL (ref 0.0–0.1)
Basophils Relative: 0 %
Eosinophils Absolute: 0.1 10*3/uL (ref 0.0–0.7)
Eosinophils Relative: 2 %
HEMATOCRIT: 36.7 % — AB (ref 39.0–52.0)
HEMOGLOBIN: 12.8 g/dL — AB (ref 13.0–17.0)
LYMPHS PCT: 16 %
Lymphs Abs: 1.1 10*3/uL (ref 0.7–4.0)
MCH: 32.6 pg (ref 26.0–34.0)
MCHC: 34.9 g/dL (ref 30.0–36.0)
MCV: 93.4 fL (ref 78.0–100.0)
Monocytes Absolute: 0.3 10*3/uL (ref 0.1–1.0)
Monocytes Relative: 4 %
NEUTROS ABS: 5.6 10*3/uL (ref 1.7–7.7)
NEUTROS PCT: 78 %
PLATELETS: 72 10*3/uL — AB (ref 150–400)
RBC: 3.93 MIL/uL — AB (ref 4.22–5.81)
RDW: 17.3 % — ABNORMAL HIGH (ref 11.5–15.5)
WBC: 7.1 10*3/uL (ref 4.0–10.5)

## 2017-04-08 LAB — GLUCOSE, CAPILLARY: GLUCOSE-CAPILLARY: 144 mg/dL — AB (ref 65–99)

## 2017-04-08 LAB — APTT: aPTT: 36 seconds (ref 24–36)

## 2017-04-08 LAB — AMMONIA: Ammonia: 79 umol/L — ABNORMAL HIGH (ref 9–35)

## 2017-04-08 MED ORDER — SODIUM CHLORIDE 0.9 % IV SOLN
Freq: Once | INTRAVENOUS | Status: AC
  Administered 2017-04-08: 05:00:00 via INTRAVENOUS

## 2017-04-08 MED ORDER — CIPROFLOXACIN HCL 250 MG PO TABS
250.0000 mg | ORAL_TABLET | Freq: Every day | ORAL | Status: AC
Start: 2017-04-08 — End: 2017-04-10
  Administered 2017-04-08 – 2017-04-10 (×3): 250 mg via ORAL
  Filled 2017-04-08 (×3): qty 1

## 2017-04-08 MED ORDER — DELFLEX-LC/1.5% DEXTROSE 344 MOSM/L IP SOLN: 1000.0000 mL | INTRAPERITONEAL | Status: AC

## 2017-04-08 MED ORDER — MUPIROCIN CALCIUM 2 % EX CREA
TOPICAL_CREAM | Freq: Two times a day (BID) | CUTANEOUS | Status: DC
  Administered 2017-04-08 – 2017-04-16 (×11): via TOPICAL
  Filled 2017-04-08 (×2): qty 15

## 2017-04-08 MED ORDER — RIVAROXABAN 15 MG PO TABS
15.0000 mg | ORAL_TABLET | Freq: Every day | ORAL | Status: DC
  Administered 2017-04-08: 15 mg via ORAL
  Filled 2017-04-08: qty 1

## 2017-04-08 MED ORDER — TRAMADOL HCL 50 MG PO TABS
50.0000 mg | ORAL_TABLET | Freq: Once | ORAL | Status: AC
  Administered 2017-04-08: 50 mg via ORAL
  Filled 2017-04-08: qty 1

## 2017-04-08 MED ORDER — ONDANSETRON HCL 4 MG PO TABS: 4.0000 mg | ORAL_TABLET | Freq: Four times a day (QID) | ORAL | Status: DC | PRN

## 2017-04-08 MED ORDER — SODIUM CHLORIDE 0.9 % IV SOLN: 250.0000 mL | INTRAVENOUS | Status: DC | PRN

## 2017-04-08 MED ORDER — LACTULOSE 10 GM/15ML PO SOLN
20.0000 g | Freq: Three times a day (TID) | ORAL | Status: DC
  Administered 2017-04-08 – 2017-04-12 (×7): 20 g via ORAL
  Administered 2017-04-12: 10 g via ORAL
  Administered 2017-04-13 – 2017-04-16 (×10): 20 g via ORAL
  Filled 2017-04-08 (×24): qty 30

## 2017-04-08 MED ORDER — ALLOPURINOL 100 MG PO TABS
200.0000 mg | ORAL_TABLET | Freq: Every day | ORAL | Status: DC
  Administered 2017-04-09 – 2017-04-11 (×3): 200 mg via ORAL
  Filled 2017-04-08 (×3): qty 2

## 2017-04-08 MED ORDER — SODIUM CHLORIDE 0.9% FLUSH
3.0000 mL | Freq: Two times a day (BID) | INTRAVENOUS | Status: DC
  Administered 2017-04-08 – 2017-04-16 (×13): 3 mL via INTRAVENOUS

## 2017-04-08 MED ORDER — METOPROLOL TARTRATE 50 MG PO TABS
50.0000 mg | ORAL_TABLET | Freq: Two times a day (BID) | ORAL | Status: DC
  Administered 2017-04-08 – 2017-04-16 (×15): 50 mg via ORAL
  Filled 2017-04-08 (×16): qty 1

## 2017-04-08 MED ORDER — TRAMADOL HCL 50 MG PO TABS
50.0000 mg | ORAL_TABLET | Freq: Four times a day (QID) | ORAL | Status: DC | PRN
  Administered 2017-04-08 – 2017-04-14 (×12): 50 mg via ORAL
  Filled 2017-04-08 (×12): qty 1

## 2017-04-08 MED ORDER — ONDANSETRON HCL 4 MG/2ML IJ SOLN: 4.0000 mg | Freq: Four times a day (QID) | INTRAMUSCULAR | Status: DC | PRN

## 2017-04-08 MED ORDER — DIAZEPAM 5 MG PO TABS
10.0000 mg | ORAL_TABLET | Freq: Every day | ORAL | Status: DC | PRN
  Administered 2017-04-14 – 2017-04-15 (×2): 10 mg via ORAL
  Filled 2017-04-08 (×2): qty 2

## 2017-04-08 MED ORDER — LACTULOSE 10 GM/15ML PO SOLN
30.0000 g | Freq: Once | ORAL | Status: AC
  Administered 2017-04-08: 30 g via ORAL
  Filled 2017-04-08 (×2): qty 45

## 2017-04-08 MED ORDER — LORATADINE 10 MG PO TABS
10.0000 mg | ORAL_TABLET | Freq: Every day | ORAL | Status: DC
  Administered 2017-04-08 – 2017-04-16 (×9): 10 mg via ORAL
  Filled 2017-04-08 (×11): qty 1

## 2017-04-08 MED ORDER — SODIUM CHLORIDE 0.9% FLUSH: 3.0000 mL | INTRAVENOUS | Status: DC | PRN

## 2017-04-08 MED ORDER — FENTANYL CITRATE (PF) 100 MCG/2ML IJ SOLN
25.0000 ug | Freq: Once | INTRAMUSCULAR | Status: AC
  Administered 2017-04-08: 25 ug via INTRAVENOUS
  Filled 2017-04-08: qty 2

## 2017-04-08 MED ORDER — ZOLPIDEM TARTRATE 5 MG PO TABS
5.0000 mg | ORAL_TABLET | Freq: Every evening | ORAL | Status: DC | PRN
  Administered 2017-04-10: 5 mg via ORAL
  Filled 2017-04-08 (×2): qty 1

## 2017-04-08 MED ORDER — TERAZOSIN HCL 5 MG PO CAPS
5.0000 mg | ORAL_CAPSULE | ORAL | Status: DC
  Filled 2017-04-08: qty 1

## 2017-04-08 MED ORDER — DICYCLOMINE HCL 10 MG PO CAPS: 10.0000 mg | ORAL_CAPSULE | ORAL | Status: DC

## 2017-04-08 MED ORDER — ACETAMINOPHEN 500 MG PO TABS
1000.0000 mg | ORAL_TABLET | Freq: Two times a day (BID) | ORAL | Status: DC | PRN
  Administered 2017-04-11 – 2017-04-14 (×5): 1000 mg via ORAL
  Filled 2017-04-08 (×5): qty 2

## 2017-04-08 NOTE — Consult Note (Signed)
[facility] Nurse wound consult note Reason for Consult:Patient with injury to left dorsal hand and forearm from playing with his dog.  There was no bite,simply a scratch that made a skin tear to these locations.  Patient's wife dressed the wounds this AM.   Wound type: Skin tear from animal scratch.  Pressure Injury POA: N/A Measurement: Left forearm 3 cm x 0.2 cm skin tear with approximated edges.   Left dorsal hand 2.2 cm x 0.2 cm skin tear with approximated edges. Wound YVO:PFYT and moist with serous weeping.  Drainage (amount, consistency, odor) serous weeping  No odor.  Periwound:intact Dressing procedure/placement/frequency:Cleanse wounds to left hand and forearm with NS.    Apply Bactroban vaseline gauze for atraumatic dressing removal. Wrap with kerlix. Change daily.  Will not follow at this time.  Please re-consult if needed.  Domenic Moras RN BSN [facility] Pager [phone]

## 2017-04-08 NOTE — Consult Note (Signed)
Reason for Consult: Status post laparoscopic umbilical hernia repair and placement of peritoneal dialysis catheter 04/05/17, Demetrius Revel, MD in [place], [place]  Chief complaint:   abdominal pain and scrotal swelling since his surgery 2 days ago.  Referring Physician: B. Regalado  Marylynn Pearson II, MD is an 76 y.o. male.  HPI: 76 year old male with multiple medical conditions including congestive heart failure, renal failure, liver failure, on Xarelto. Patient underwent laparoscopic umbilical hernia repair and placement of a peritoneal dialysis catheter on 04/05/17. He also underwent a 2 L paracentesis at that time. He was discharged home. In the last 48 hours he complains of weakness increase confusion no fever. Has had 2 bowel movements over the last 2 days. He awoke this a.m. complaining of pain in the surgical site with significant swelling in his lower abdomen and scrotal area. His wife reported she increasing confusion in the last week with known elevated ammonia levels. He presented to the emergency room here at [facility] for evaluation.  Workup in the emergency department shows he is afebrile, vital signs are stable. Sodium is 129, glucose is 150. Creatinine 4.33 ammonia level is 79. Total bilirubin 1.6. Alkaline phosphatase, AST, ALT are unremarkable. Total protein 6.0. Albumin 3.3.  WBC 7.1, hemoglobin 12.8, hematocrit 36.7. Platelets 72,000. INR is 2.0.  CT of the head this a.m. shows no acute changes, no subacute or traumatic findings. Generalized brain atrophy. Small vessel changes of deep white matter. Resolving vertex scalp injury. We are asked to see.  Past Medical History:  Diagnosis Date  . 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 Anemia   . Anxiety   . Atrial flutter (H[facility])    hx of  . Biceps tendon tear    right  . Bone marrow disease   . Cancer ([place])    basal cell  . CHF (congestive heart failure) ([place])   . Chronic kidney disease (CKD), stage III  (moderate)    lov note dr Koleen Nimrod nephrology 09-17-13 on chart  . Complication of anesthesia 11-16-13   required alot of versed per anesthesia with cataract surgery  . Coronary heart disease    stent, CABG, RBBB  . DDD (degenerative disc disease)   . Diabetes ([place])   . Diabetes mellitus   . Fungal toenail infection   . Gout   . Hemorrhagic cystitis 2012  . Hepatitis    mono  . HTN (hypertension)   . Hypercalcemia 2011   due to sarcoidiosis  . Myocardial infarction [facility])    1995, 2002, 2006  . OSA (obstructive sleep apnea)    use bipap setting of 10 and 12  . Osteoarthritis   . Pneumonia 1966, 2011   hx of  . Presence of permanent cardiac pacemaker   . Right sciatic nerve pain    for block thursday 01-21-2014, injections  . Sarcoid 2011   pulmonary and bone marrow  . Shortness of breath dyspnea   . Synovial cyst of lumbar spine    l3-l4, l4-l5, injections  . Valvular heart disease    aortic stenosis/regurgitation    Past Surgical History:  Procedure Laterality Date  . BASAL CELL CARCINOMA EXCISION Right 2015   ear  . CARDIAC CATHETERIZATION N/A 12/10/2016   Procedure: Right Heart Cath;  Surgeon: Jolaine Artist, MD;  Location: [facility] CV LAB;  Service: Cardiovascular;  Laterality: N/A;  . CARDIOVERSION N/A 12/30/2013   Procedure: CARDIOVERSION;  Surgeon: [name], MD;  Location: [facility]  ENDOSCOPY;  Service: Cardiovascular;  Laterality: N/A;  10:49 cardioversion at 120 joules, then 150 joules, to SB  used  Lido 72m,  Propofol 160 mcg  . CARDIOVERSION  2003, 2006, 2012, 2013  . cataract surgery Right 2007  . cataract surgery Left 11-16-13  . COLONOSCOPY N/A 01/29/2014   Procedure: COLONOSCOPY;  Surgeon: JWinfield Cunas, MD;  [facility];  Service: Endoscopy;  Laterality: N/A;  amanda//ja  . CORONARY ARTERY BYPASS GRAFT  2006   x 5  . EP IMPLANTABLE DEVICE N/A 05/05/2015   Procedure: Pacemaker Implant;  Surgeon: JThompson Grayer MD;  [place] [facility];  Service: Cardiovascular;  Laterality: N/A;  . FLEXIBLE SIGMOIDOSCOPY N/A 12/07/2015   Procedure: FLEXIBLE SIGMOIDOSCOPY;  Surgeon: VWilford Corner MD;  [facility];  Service: Endoscopy;  Laterality: N/A;  Unprepped  . INSERT / REPLACE / REMOVE PACEMAKER  05/05/2015  . PORTACATH PLACEMENT    . portacath removed    . Redo Median sternotomy, extracorporeal cirulation, AVR, Tricuspid valve repair  06/13/2011   AVR(23-mm Edwards pericardial Magna-Ease valve./ TVrepair (34-mm Edwards MC3 annuloplasty ring  . TOTAL KNEE ARTHROPLASTY Right 05/24/2014   Procedure: RIGHT TOTAL KNEE ARTHROPLASTY;  Surgeon: FGearlean Alf MD;  Location: [facility] ORS;  Service: Orthopedics;  Laterality: Right;  . TRANSURETHRAL RESECTION OF PROSTATE  oct. 2012   TURP  . VASECTOMY  1977    Family History  Problem Relation Age of Onset  . Heart attack Father   . Stroke Father   . Stroke Mother   . Allergies    . Asthma    . Heart disease    . Cancer      Social History:  reports that he quit smoking about 21 years ago. His smoking use included Pipe and Cigars. He quit after 28.00 years of use. He has never used smokeless tobacco. He reports that he does not drink alcohol or use drugs.  Allergies:  Allergies  Allergen Reactions  . Actos [Pioglitazone Hydrochloride] Swelling and Other (See Comments)    Severe peripheral edema  . Codeine Other (See Comments)    Makes Pt aggressive  . Depakote [Divalproex Sodium] Diarrhea    severe  . Diltiazem Other (See Comments)    lethargy and dyspnea  . Hydralazine Other (See Comments)    Severe chills and SOB   . Isosorbide Dinitrate Other (See Comments)    Severe chills and SOB   . Januvia [Sitagliptin] Diarrhea and Other (See Comments)    bradycardia  . Loop Diuretics Other (See Comments)    Spike in BUN and creatine levels  . Losartan Other (See Comments)    aphasia  . Nsaids Other (See Comments)    Renal problems  . Onglyza  [Saxagliptin] Diarrhea and Other (See Comments)    bradycardia  . Orudis [Ketoprofen] Other (See Comments)    Cannot take due to renal insufficiency  . Penicillins Swelling and Other (See Comments)    Serum sickness Has patient had a PCN reaction causing immediate rash, facial/tongue/throat swelling, SOB or lightheadedness with hypotension: Yes Has patient had a PCN reaction causing severe rash involving mucus membranes or skin necrosis: No Has patient had a PCN reaction that required hospitalization Yes Has patient had a PCN reaction occurring within the last 10 years: No If all of the above answers are "NO", then may proceed with Cephalosporin use.   .Marland KitchenPotassium Iodide Itching and Rash    Severe entire body itching and rash  .  Rozerem [Ramelteon] Diarrhea    Severe   . Amiodarone Other (See Comments)    diarrhea  . Latex Swelling  . Verapamil Other (See Comments)    Severe fatigue  . Benazepril Hcl Other (See Comments)    ? Possible lowers platelets  . Indomethacin Other (See Comments)    Renal problems   . Minocycline Other (See Comments)    Makes dizzy and felt just lousy.  [name] Pentazocine Lactate Other (See Comments)    Unknown allergic reaction - pt and wife do not recall this  . Poison Ivy Extract [Poison Ivy Extract] Rash and Other (See Comments)    blisters  . Sertraline Hcl Other (See Comments)    Pt and wife do not recall this  . Shellfish Allergy Rash    Prior to Admission medications   Medication Sig Start Date End Date Taking? Authorizing Provider  acetaminophen (TYLENOL) 500 MG tablet Take 1,000 mg by mouth 2 (two) times daily as needed (pain).    Yes Historical Provider, MD  allopurinol (ZYLOPRIM) 100 MG tablet Take 200 mg by mouth daily with breakfast.  08/06/11  Yes [name], MD  cetirizine (ZYRTEC) 10 MG tablet Take 10 mg by mouth daily as needed for allergies.    Yes Historical Provider, MD  diazepam (VALIUM) 10 MG tablet Take 10 mg by mouth daily  as needed (ankle and leg cramps).  04/29/15  Yes Historical Provider, MD  dicyclomine (BENTYL) 10 MG capsule Take 10 mg by mouth every morning.    Yes Historical Provider, MD  diphenoxylate-atropine (LOMOTIL) 2.5-0.025 MG per tablet Take 2 tablets by mouth 2 (two) times daily as needed for diarrhea or loose stools. stomach 04/06/15  Yes Historical Provider, MD  insulin glargine (LANTUS) 100 unit/mL SOPN Inject 2-4 Units into the skin daily as needed (CBG >120). CBG 120-140 2 units, 141-160 3 units, 161-180 4 units, 181-200 5 units, 201-220 6 units   Yes Historical Provider, MD  lactulose (CHRONULAC) 10 GM/15ML solution Take 20 g by mouth 3 (three) times daily as needed for constipation. 03/22/17  Yes Historical Provider, MD  metolazone (ZAROXOLYN) 2.5 MG tablet TAKE ONE TABLET BY MOUTH ONCE WEEKLY ON FRIDAYS  AS NEEDED WITH 20MEQ POTASSIUM 03/27/17  Yes Jolaine Artist, MD  metoprolol (LOPRESSOR) 50 MG tablet Take 1 tablet (50 mg total) by mouth 2 (two) times daily. 09/26/16  Yes Thayer Headings, MD  potassium chloride SA (K-DUR,KLOR-CON) 20 MEQ tablet Take 20 mEq by mouth every Saturday.   Yes Historical Provider, MD  Rivaroxaban (XARELTO) 15 MG TABS tablet Take 1 tablet (15 mg total) by mouth daily with supper. 02/01/17  Yes Jolaine Artist, MD  sucroferric oxyhydroxide (VELPHORO) 500 MG chewable tablet Chew 500 mg by mouth 3 (three) times daily as needed (high phosphorus).    Yes Historical Provider, MD  terazosin (HYTRIN) 5 MG capsule Take 1 capsule (5 mg total) by mouth every morning. 01/08/17  Yes Thompson Grayer, MD  Testosterone Cypionate 200 MG/ML KIT Inject 1 mL into the muscle every 14 (fourteen) days.   Yes Historical Provider, MD  torsemide (DEMADEX) 20 MG tablet Take 40 mg by mouth every morning.    Yes Historical Provider, MD  traMADol (ULTRAM) 50 MG tablet Take 50 mg by mouth every 6 (six) hours as needed for pain. 04/05/17 05/05/17 Yes Historical Provider, MD  zolpidem (AMBIEN) 10 MG tablet  Take 10 mg by mouth at bedtime as needed for sleep.    Yes  Historical Provider, MD     Results for orders placed or performed during the hospital encounter of 04/08/17 (from the past 48 hour(s))  Ammonia     Status: Abnormal   Collection Time: 04/08/17  3:31 AM  Result Value Ref Range   Ammonia 79 (H) 9 - 35 umol/L  Comprehensive metabolic panel     Status: Abnormal   Collection Time: 04/08/17  3:37 AM  Result Value Ref Range   Sodium 129 (L) 135 - 145 mmol/L   Potassium 4.2 3.5 - 5.1 mmol/L   Chloride 91 (L) 101 - 111 mmol/L   CO2 25 22 - 32 mmol/L   Glucose, Bld 150 (H) 65 - 99 mg/dL   BUN 133 (H) 6 - 20 mg/dL    Comment: RESULTS CONFIRMED BY MANUAL DILUTION   Creatinine, Ser 4.33 (H) 0.61 - 1.24 mg/dL   Calcium 9.3 8.9 - 10.3 mg/dL   Total Protein 6.0 (L) 6.5 - 8.1 g/dL   Albumin 3.3 (L) 3.5 - 5.0 g/dL   AST 20 15 - 41 U/L   ALT 9 (L) 17 - 63 U/L   Alkaline Phosphatase 72 38 - 126 U/L   Total Bilirubin 1.6 (H) 0.3 - 1.2 mg/dL   GFR calc non Af Amer 12 (L) >60 mL/min   GFR calc Af Amer 14 (L) >60 mL/min    Comment: (NOTE) The eGFR has been calculated using the CKD EPI equation. This calculation has not been validated in all clinical situations. eGFR's persistently <60 mL/min signify possible Chronic Kidney Disease.    Anion gap 13 5 - 15  CBC with Differential     Status: Abnormal   Collection Time: 04/08/17  3:37 AM  Result Value Ref Range   WBC 7.1 4.0 - 10.5 K/uL   RBC 3.93 (L) 4.22 - 5.81 MIL/uL   Hemoglobin 12.8 (L) 13.0 - 17.0 g/dL   HCT 36.7 (L) 39.0 - 52.0 %   MCV 93.4 78.0 - 100.0 fL   MCH 32.6 26.0 - 34.0 pg   MCHC 34.9 30.0 - 36.0 g/dL   RDW 17.3 (H) 11.5 - 15.5 %   Platelets 72 (L) 150 - 400 K/uL    Comment: RESULT REPEATED AND VERIFIED SPECIMEN CHECKED FOR CLOTS PLATELET COUNT CONFIRMED BY SMEAR    Neutrophils Relative % 78 %   Neutro Abs 5.6 1.7 - 7.7 K/uL   Lymphocytes Relative 16 %   Lymphs Abs 1.1 0.7 - 4.0 K/uL   Monocytes Relative 4 %    Monocytes Absolute 0.3 0.1 - 1.0 K/uL   Eosinophils Relative 2 %   Eosinophils Absolute 0.1 0.0 - 0.7 K/uL   Basophils Relative 0 %   Basophils Absolute 0.0 0.0 - 0.1 K/uL  Protime-INR     Status: Abnormal   Collection Time: 04/08/17  3:37 AM  Result Value Ref Range   Prothrombin Time 22.9 (H) 11.4 - 15.2 seconds   INR 2.00   APTT     Status: None   Collection Time: 04/08/17  3:37 AM  Result Value Ref Range   aPTT 36 24 - 36 seconds    No results found.  Review of Systems  Constitutional: Negative.   HENT: Negative.   Eyes: Negative.   Respiratory: Negative.   Cardiovascular: Negative.   Gastrointestinal: Positive for abdominal pain. Negative for blood in stool, constipation, diarrhea and melena.       Noted increased abdominal swelling, penile/scrotal swelling.  Genitourinary: Negative for dysuria, flank pain,  frequency, hematuria and urgency.       Trouble voiding in supine position.  Musculoskeletal: Negative.   Skin: Negative.   Neurological: Negative.   Endo/Heme/Allergies: Bruises/bleeds easily.  Psychiatric/Behavioral: Negative.    Blood pressure (!) 141/74, pulse 66, temperature 97.5 F (36.4 C), temperature source Oral, resp. rate 16, SpO2 95 %. Physical Exam  Constitutional: He is oriented to person, place, and time. He appears well-developed and well-nourished. No distress.  HENT:  Head: Normocephalic and atraumatic.  Eyes: Right eye exhibits no discharge. Left eye exhibits no discharge. No scleral icterus.  Pupils equal  Neck: Normal range of motion. Neck supple. JVD present. No tracheal deviation present. No thyromegaly present.  Cardiovascular: Normal rate, regular rhythm and intact distal pulses.   Murmur (Aortic and mitral murmurs.) heard. Respiratory: Effort normal and breath sounds normal. No stridor. No respiratory distress. He has no wheezes. He has no rales. He exhibits no tenderness.  GI: Soft.  He has multiple incisions over the course of his  abdomen. Dressings were removed and these are all healing nicely. He has a PD catheter in place. No issues with PD catheter. He has some ecchymosis abdominal wall. Extremely large abdomen. He notes the abdominal distention is about the same as before his last paracentesis on 04/05/17.  Genitourinary: Penile tenderness present.  Genitourinary Comments: He has marked penile swelling with some skin tears along the body of the penis. It is difficult to examine his scrotum in this position. Testicles and scrotum do not seem to be overly swollen or tender.  Musculoskeletal: He exhibits edema.  Lymphadenopathy:    He has no cervical adenopathy.  Neurological: He is alert and oriented to person, place, and time. No cranial nerve deficit.  Skin: Skin is warm and dry. No rash noted. He is not diaphoretic. No erythema. No pallor.  Psychiatric: He has a normal mood and affect. His behavior is normal. Judgment and thought content normal.    Assessment/Plan: Abdominal swelling, penile and scrotal swelling. Status post ventral hernia repair and peritoneal dialysis catheter placement 04/05/17, Demetrius Revel, MD in [facility] [place]. ESRD Hx of Liver failure CHF - EF 45%, S/P AVR with bioprosthetic2012, CABG 2006, Mitral & Tricuspid regurgitation Atrial fibrillation on Xarelto Insulin-dependent diabetes Obstructive sleep apnea Hypertension History of sarcoid  Plan: On exam of his abdominal incisions these were all healing nicely. There was some post operative bleeding at some of these sites. These were cleaned and redressed. The peritoneal dialysis catheter is in place. No erythema, no bleeding, no issues noted with the catheter. We did not attempt to flush it. Scrotal exam is difficult because of the physiology and his positioning. It did not appear to be significant. He does have significant penile swelling with some skin tears present. This can be gently cleaned and keep it elevated. Dr. Johney Maine or Dr. Grandville Silos  will see him in follow-up. He is scheduled be transferred to [facility] for dialysis sometime today. Kiona Blume 04/08/2017, 11:04 AM

## 2017-04-08 NOTE — ED Notes (Signed)
Pt was given ice water for po challenge. 

## 2017-04-08 NOTE — Consult Note (Signed)
Reason for Consult: To manage dialysis and dialysis related needs Referring Physician: Dr. Helayne Seminole II, MD is an 76 y.o. male.  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  Pt follows with Dr. [name] and actually had a PD catheter placed on 4/13 with Dr. Raul Del.  This was uncomplicated.  He presented today due to abd and genital swelling and worsening confusion.  His son tells me that he's been lethargic and sleeping lots especially over the past month.  Has had a distaste for meats as well, especially beef and pork.  Pt reports "mental fuzziness".    In the ED, he was found to have BUN of 133, Cr of 4.33, and an ammonia of 79.  Was given dose of lactulose.     Past Medical History:  Diagnosis Date  . A-fib ([place])    permanent with tachy-brady syndrome  . Anemia   . Anxiety   . Atrial flutter ([facility])    hx of  . Biceps tendon tear    right  . Bone marrow disease   . Cancer ([place])    basal cell  . CHF (congestive heart failure) ([place])   . Chronic kidney disease (CKD), stage III (moderate)    lov note dr Koleen Nimrod nephrology 09-17-13 on chart  . Complication of anesthesia 11-16-13   required alot of versed per anesthesia with cataract surgery  . Coronary heart disease    stent, CABG, RBBB  . DDD (degenerative disc disease)   . Diabetes ([place])   . Diabetes mellitus   . Fungal toenail infection   . Gout   . Hemorrhagic cystitis 2012  . Hepatitis    mono  . HTN (hypertension)   . Hypercalcemia 2011   due to sarcoidiosis  . Myocardial infarction [facility])    1995, 2002, 2006  . OSA (obstructive sleep apnea)    use bipap setting of 10 and 12  . Osteoarthritis   .  Pneumonia 1966, 2011   hx of  . Presence of permanent cardiac pacemaker   . Right sciatic nerve pain    for block thursday 01-21-2014, injections  . Sarcoid 2011   pulmonary and bone marrow  . Shortness of breath dyspnea   . Synovial cyst of lumbar spine    l3-l4, l4-l5, injections  . Valvular heart disease    aortic stenosis/regurgitation    Past Surgical History:  Procedure Laterality Date  . BASAL CELL CARCINOMA EXCISION Right 2015   ear  . CARDIAC CATHETERIZATION N/A 12/10/2016   Procedure: Right Heart Cath;  Surgeon: Jolaine Artist, MD;  Location: [name];  Service: Cardiovascular;  Laterality: N/A;  . CARDIOVERSION N/A 12/30/2013   Procedure: CARDIOVERSION;  Surgeon: [name], MD;  Location[facility] ENDOSCOPY;  Service: Cardiovascular;  Laterality: N/A;  10:49 cardioversion at 120 joules, then 150 joules, to SB  used  Lido 32m,  Propofol 160 mcg  . CARDIOVERSION  2003, 2006, 2012, 2013  . cataract surgery Right 2007  . cataract surgery Left 11-16-13  . COLONOSCOPY N/A 01/29/2014   Procedure: COLONOSCOPY;  Surgeon: JWinfield Cunas, MD;  [facility];  Service: Endoscopy;  Laterality: N/A;  amanda//ja  .  CORONARY ARTERY BYPASS GRAFT  2006   x 5  . EP IMPLANTABLE DEVICE N/A 05/05/2015   Procedure: Pacemaker Implant;  Surgeon: Thompson Grayer, MD;  Location: [place] [facility];  Service: Cardiovascular;  Laterality: N/A;  . FLEXIBLE SIGMOIDOSCOPY N/A 12/07/2015   Procedure: FLEXIBLE SIGMOIDOSCOPY;  Surgeon: Wilford Corner, MD;  Location: [facility] ENDOSCOPY;  Service: Endoscopy;  Laterality: N/A;  Unprepped  . INSERT / REPLACE / REMOVE PACEMAKER  05/05/2015  . PORTACATH PLACEMENT    . portacath removed    . Redo Median sternotomy, extracorporeal cirulation, AVR, Tricuspid valve repair  06/13/2011   AVR(23-mm Edwards pericardial Magna-Ease valve./ TVrepair (34-mm Edwards MC3 annuloplasty ring  . TOTAL KNEE ARTHROPLASTY Right 05/24/2014   Procedure: RIGHT TOTAL  KNEE ARTHROPLASTY;  Surgeon: Gearlean Alf, MD;  Location: [facility] ORS;  Service: Orthopedics;  Laterality: Right;  . TRANSURETHRAL RESECTION OF PROSTATE  oct. 2012   TURP  . VASECTOMY  1977    Family History  Problem Relation Age of Onset  . Heart attack Father   . Stroke Father   . Stroke Mother   . Allergies    . Asthma    . Heart disease    . Cancer      Social History:  reports that he quit smoking about 21 years ago. His smoking use included Pipe and Cigars. He quit after 28.00 years of use. He has never used smokeless tobacco. He reports that he does not drink alcohol or use drugs.  Allergies:  Allergies  Allergen Reactions  . Actos [Pioglitazone Hydrochloride] Swelling and Other (See Comments)    Severe peripheral edema  . Codeine Other (See Comments)    Makes Pt aggressive  . Depakote [Divalproex Sodium] Diarrhea    severe  . Diltiazem Other (See Comments)    lethargy and dyspnea  . Hydralazine Other (See Comments)    Severe chills and SOB   . Isosorbide Dinitrate Other (See Comments)    Severe chills and SOB   . Januvia [Sitagliptin] Diarrhea and Other (See Comments)    bradycardia  . Loop Diuretics Other (See Comments)    Spike in BUN and creatine levels  . Losartan Other (See Comments)    aphasia  . Nsaids Other (See Comments)    Renal problems  . Onglyza [Saxagliptin] Diarrhea and Other (See Comments)    bradycardia  . Orudis [Ketoprofen] Other (See Comments)    Cannot take due to renal insufficiency  . Penicillins Swelling and Other (See Comments)    Serum sickness Has patient had a PCN reaction causing immediate rash, facial/tongue/throat swelling, SOB or lightheadedness with hypotension: Yes Has patient had a PCN reaction causing severe rash involving mucus membranes or skin necrosis: No Has patient had a PCN reaction that required hospitalization Yes Has patient had a PCN reaction occurring within the last 10 years: No If all of the above answers  are "NO", then may proceed with Cephalosporin use.   [name] Potassium Iodide Itching and Rash    Severe entire body itching and rash  . Rozerem [Ramelteon] Diarrhea    Severe   . Amiodarone Other (See Comments)    diarrhea  . Latex Swelling  . Verapamil Other (See Comments)    Severe fatigue  . Benazepril Hcl Other (See Comments)    ? Possible lowers platelets  . Indomethacin Other (See Comments)    Renal problems   . Minocycline Other (See Comments)    Makes dizzy and felt just lousy.  [name]  Pentazocine Lactate Other (See Comments)    Unknown allergic reaction - pt and wife do not recall this  . Poison Ivy Extract [Poison Ivy Extract] Rash and Other (See Comments)    blisters  . Sertraline Hcl Other (See Comments)    Pt and wife do not recall this  . Shellfish Allergy Rash    Medications: I have reviewed pt's current and home meds   Results for orders placed or performed during the hospital encounter of 04/08/17 (from the past 48 hour(s))  Ammonia     Status: Abnormal   Collection Time: 04/08/17  3:31 AM  Result Value Ref Range   Ammonia 79 (H) 9 - 35 umol/L  Comprehensive metabolic panel     Status: Abnormal   Collection Time: 04/08/17  3:37 AM  Result Value Ref Range   Sodium 129 (L) 135 - 145 mmol/L   Potassium 4.2 3.5 - 5.1 mmol/L   Chloride 91 (L) 101 - 111 mmol/L   CO2 25 22 - 32 mmol/L   Glucose, Bld 150 (H) 65 - 99 mg/dL   BUN 133 (H) 6 - 20 mg/dL    Comment: RESULTS CONFIRMED BY MANUAL DILUTION   Creatinine, Ser 4.33 (H) 0.61 - 1.24 mg/dL   Calcium 9.3 8.9 - 10.3 mg/dL   Total Protein 6.0 (L) 6.5 - 8.1 g/dL   Albumin 3.3 (L) 3.5 - 5.0 g/dL   AST 20 15 - 41 U/L   ALT 9 (L) 17 - 63 U/L   Alkaline Phosphatase 72 38 - 126 U/L   Total Bilirubin 1.6 (H) 0.3 - 1.2 mg/dL   GFR calc non Af Amer 12 (L) >60 mL/min   GFR calc Af Amer 14 (L) >60 mL/min    Comment: (NOTE) The eGFR has been calculated using the CKD EPI equation. This calculation has not been validated in  all clinical situations. eGFR's persistently <60 mL/min signify possible Chronic Kidney Disease.    Anion gap 13 5 - 15  CBC with Differential     Status: Abnormal   Collection Time: 04/08/17  3:37 AM  Result Value Ref Range   WBC 7.1 4.0 - 10.5 K/uL   RBC 3.93 (L) 4.22 - 5.81 MIL/uL   Hemoglobin 12.8 (L) 13.0 - 17.0 g/dL   HCT 36.7 (L) 39.0 - 52.0 %   MCV 93.4 78.0 - 100.0 fL   MCH 32.6 26.0 - 34.0 pg   MCHC 34.9 30.0 - 36.0 g/dL   RDW 17.3 (H) 11.5 - 15.5 %   Platelets 72 (L) 150 - 400 K/uL    Comment: RESULT REPEATED AND VERIFIED SPECIMEN CHECKED FOR CLOTS PLATELET COUNT CONFIRMED BY SMEAR    Neutrophils Relative % 78 %   Neutro Abs 5.6 1.7 - 7.7 K/uL   Lymphocytes Relative 16 %   Lymphs Abs 1.1 0.7 - 4.0 K/uL   Monocytes Relative 4 %   Monocytes Absolute 0.3 0.1 - 1.0 K/uL   Eosinophils Relative 2 %   Eosinophils Absolute 0.1 0.0 - 0.7 K/uL   Basophils Relative 0 %   Basophils Absolute 0.0 0.0 - 0.1 K/uL  Protime-INR     Status: Abnormal   Collection Time: 04/08/17  3:37 AM  Result Value Ref Range   Prothrombin Time 22.9 (H) 11.4 - 15.2 seconds   INR 2.00   APTT     Status: None   Collection Time: 04/08/17  3:37 AM  Result Value Ref Range   aPTT 36 24 - 36  seconds    No results found.  ROS: all other systems reviewed and are negative Blood pressure (!) 147/78, pulse 69, temperature 97.9 F (36.6 C), temperature source Oral, resp. rate 18, SpO2 96 %. .  GEN older gentleman, NAD HEENT EOMI, PERRL, tacky MM NECK + JVD PULM muffled at bases CV: irregular, III/VI systolic murmur at L and R upper sternal border (but heard throughout precordium) ABD obese, laparoscopic port incisions dressed, c/d/I, PD cath dressed as well EXT 2+ LE edema GU: penile swelling NEURO: mild asterixis, some confusion  Assessment/Plan: 1 abd and scrotal swelling: some vol overload- had 2L fluid removed in PD cath; suspect abd binder may have caused some fluid to pool in uncomfortable  places 2 ESRD: He will likely need to begin supine PD with 1L fill vols to clear some uremia- have also spoken to home training to schedule him for training hopefully next week 3 Atrial fibrillation: on metoprolol.  Don't recommend Xarelto in ESRD 4.  Elevated ammonia- mild HE; lactulose TID 5.  Combined CHF: appears a little vol overloaded- PD will help this  6.  Hyponatremia: has already received IVF; will try to add on urine and serum osms, think it's due to mild overload  Dynver Clemson 04/08/2017, 3:55 PM

## 2017-04-08 NOTE — ED Provider Notes (Signed)
[facility] DEPT Provider Note   CSN: [id number] Arrival date & time: 04/08/17  4098  By signing my name below, I, Oleh Genin, attest that this documentation has been prepared under the direction and in the presence of Varney Biles, MD. Electronically Signed: Oleh Genin, Scribe. 04/08/17. 3:23 AM.   History   Chief Complaint Chief Complaint  Patient presents with  . Post-op Problem  . 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 This patient was seen at [facility] outpatient surgery on 4/13 where he underwent laparoscopic umbilical hernia repair, placement of an indwelling abdominal catheter for peritoneal dialysis, and a paracentesis with 2L fluid pulled. He was discharged home. In the last 48 hours he has been generally weak and has had increased confusion. No fever. He had 2 bowel movements in the last two days which were normal. At 2230 he was in his typical health. But the patient's wife states he woke 1 hour ago moaning. She inspected the surgical site and noticed significant swelling to the entire lower abdomen and scrotal area. At interview, she also states the patient has been increasingly confused in the last week in the setting of known elevated ammonia. Surgery by Lemont Fillers.   The history is provided by the patient. No language interpreter was used.    Past Medical History:  Diagnosis Date  . A-fib ([place])    permanent with tachy-brady syndrome  . Anemia   . Anxiety   . Atrial flutter (H[facility])    hx of  . Biceps tendon tear    right  . Bone marrow disease   . Cancer ([place])    basal cell  . CHF (congestive heart failure) ([place])   . Chronic kidney disease (CKD), stage III (moderate)    lov note dr Koleen Nimrod nephrology 09-17-13 on chart  . Complication of anesthesia 11-16-13   required  alot of versed per anesthesia with cataract surgery  . Coronary heart disease    stent, CABG, RBBB  . DDD (degenerative disc disease)   . Diabetes ([place])   . Diabetes mellitus   . Fungal toenail infection   . Gout   . Hemorrhagic cystitis 2012  . Hepatitis    mono  . HTN (hypertension)   . Hypercalcemia 2011   due to sarcoidiosis  . Myocardial infarction [facility])    1995, 2002, 2006  . OSA (obstructive sleep apnea)    use bipap setting of 10 and 12  . Osteoarthritis   . Pneumonia 1966, 2011   hx of  . Presence of permanent cardiac pacemaker   . Right sciatic nerve pain    for block thursday 01-21-2014, injections  . Sarcoid 2011   pulmonary and bone marrow  . Shortness of breath dyspnea   . Synovial cyst of lumbar spine    l3-l4, l4-l5, injections  . Valvular heart disease    aortic stenosis/regurgitation    Patient Active Problem List   Diagnosis Date Noted  . Chronic systolic CHF (congestive heart failure) ([place]) 09/26/2016  . Right heart failure ([place]) 06/26/2016  . Leg wound, left 06/26/2016  . Chronic diastolic CHF (congestive heart failure) ([place]) 03/22/2016  . Ascites 12/02/2015  . Diarrhea 12/02/2015  . Leg cramps 12/02/2015  . Acute on chronic diastolic CHF (congestive heart failure), NYHA class 1 ([place])   .  Diastolic dysfunction 00/86/7619  . Dyspnea   . CHF (congestive heart failure) ([place]) 09/06/2015  . CAD (coronary artery disease) 05/11/2015  . S/P placement of cardiac pacemaker 05/10/2015  . Acute on chronic diastolic CHF (congestive heart failure) ([place]) 05/10/2015  . Cardiac device in situ   . Sick sinus syndrome ([place]) 05/05/2015  . Acute on chronic renal failure ([place]) 03/23/2015  . Bradycardia, sinus, persistent, severe 03/23/2015  . Acute hyponatremia 03/23/2015  . Hyperkalemia 03/23/2015  . Rib pain on right side 03/23/2015  . Poor appetite 03/23/2015  . Acute gastrointestinal bleeding 03/23/2015  . Nausea & vomiting 03/23/2015  . H/O aortic valve  replacement with tissue graft 12/09/2014  . Acute posthemorrhagic anemia 06/01/2014  . UTI (urinary tract infection) 05/31/2014  . Anxiety 05/31/2014  . Insomnia 05/31/2014  . Weakness 05/26/2014  . Fever 05/26/2014  . OA (osteoarthritis) of knee 05/24/2014  . Cataract 09/02/2013  . Osteoarthritis 09/02/2013  . Atrial flutter ([place]) 08/20/2012  . Back pain 08/20/2012  . Severe tricuspid regurgitation by prior echocardiogram 08/14/2011  . Aortic valve replaced 08/14/2011  . Chronic atrial fibrillation ([place]) 07/05/2011  . Chronic kidney disease (CKD), stage III (moderate)   . Hx of CABG 04/26/2011  . Diabetes ([place]) 04/26/2011  . Gout 04/26/2011  . BPH (benign prostatic hyperplasia) 04/26/2011  . Essential hypertension 04/26/2011  . MYOCARDIAL INFARCTION 11/27/2010  . Ischemic heart disease 11/27/2010  . Valvular heart disease 11/27/2010  . Sarcoidosis 11/24/2010  . Obstructive sleep apnea 11/24/2010  . Aortic valve disorder 11/24/2010  . DYSPNEA 11/24/2010    Past Surgical History:  Procedure Laterality Date  . BASAL CELL CARCINOMA EXCISION Right 2015   ear  . CARDIAC CATHETERIZATION N/A 12/10/2016   Procedure: Right Heart Cath;  Surgeon: Jolaine Artist, MD;  Location: [facility] CV LAB;  Service: Cardiovascular;  Laterality: N/A;  . CARDIOVERSION N/A 12/30/2013   Procedure: CARDIOVERSION;  Surgeon: [name], MD;  Location[facility]COPY;  Service: Cardiovascular;  Laterality: N/A;  10:49 cardioversion at 120 joules, then 150 joules, to SB  used  Lido 34m,  Propofol 160 mcg  . CARDIOVERSION  2003, 2006, 2012, 2013  . cataract surgery Right 2007  . cataract surgery Left 11-16-13  . COLONOSCOPY N/A 01/29/2014   Procedure: COLONOSCOPY;  Surgeon: JWinfield Cunas, MD;  [facility];  Service: Endoscopy;  Laterality: N/A;  amanda//ja  . CORONARY ARTERY BYPASS GRAFT  2006   x 5  . EP IMPLANTABLE DEVICE N/A 05/05/2015   Procedure: Pacemaker Implant;  Surgeon:  JThompson Grayer MD;  [facility];  Service: Cardiovascular;  Laterality: N/A;  . FLEXIBLE SIGMOIDOSCOPY N/A 12/07/2015   Procedure: FLEXIBLE SIGMOIDOSCOPY;  Surgeon: VWilford Corner MD;  [facility];  Service: Endoscopy;  Laterality: N/A;  Unprepped  . INSERT / REPLACE / REMOVE PACEMAKER  05/05/2015  . PORTACATH PLACEMENT    . portacath removed    . Redo Median sternotomy, extracorporeal cirulation, AVR, Tricuspid valve repair  06/13/2011   AVR(23-mm Edwards pericardial Magna-Ease valve./ TVrepair (34-mm Edwards MC3 annuloplasty ring  . TOTAL KNEE ARTHROPLASTY Right 05/24/2014   Procedure: RIGHT TOTAL KNEE ARTHROPLASTY;  Surgeon: FGearlean Alf MD;  Location: [facility];  Service: Orthopedics;  Laterality: Right;  . TRANSURETHRAL RESECTION OF PROSTATE  oct. 2012   TURP  . VASECTOMY  1977       Home Medications    Prior to Admission medications   Medication Sig Start Date End Date Taking?  Authorizing Provider  acetaminophen (TYLENOL) 500 MG tablet Take 1,000 mg by mouth 2 (two) times daily as needed (pain).    Yes Historical Provider, MD  allopurinol (ZYLOPRIM) 100 MG tablet Take 200 mg by mouth daily with breakfast.  08/06/11  Yes [name], MD  cetirizine (ZYRTEC) 10 MG tablet Take 10 mg by mouth daily as needed for allergies.    Yes Historical Provider, MD  diazepam (VALIUM) 10 MG tablet Take 10 mg by mouth daily as needed (ankle and leg cramps).  04/29/15  Yes Historical Provider, MD  dicyclomine (BENTYL) 10 MG capsule Take 10 mg by mouth every morning.    Yes Historical Provider, MD  diphenoxylate-atropine (LOMOTIL) 2.5-0.025 MG per tablet Take 2 tablets by mouth 2 (two) times daily as needed for diarrhea or loose stools. stomach 04/06/15  Yes Historical Provider, MD  insulin glargine (LANTUS) 100 unit/mL SOPN Inject 2-4 Units into the skin daily as needed (CBG >120). CBG 120-140 2 units, 141-160 3 units, 161-180 4 units, 181-200 5 units, 201-220 6 units    Yes Historical Provider, MD  lactulose (CHRONULAC) 10 GM/15ML solution Take 20 g by mouth 3 (three) times daily as needed for constipation. 03/22/17  Yes Historical Provider, MD  metolazone (ZAROXOLYN) 2.5 MG tablet TAKE ONE TABLET BY MOUTH ONCE WEEKLY ON FRIDAYS  AS NEEDED WITH 20MEQ POTASSIUM 03/27/17  Yes Jolaine Artist, MD  metoprolol (LOPRESSOR) 50 MG tablet Take 1 tablet (50 mg total) by mouth 2 (two) times daily. 09/26/16  Yes Thayer Headings, MD  potassium chloride SA (K-DUR,KLOR-CON) 20 MEQ tablet Take 20 mEq by mouth every Saturday.   Yes Historical Provider, MD  Rivaroxaban (XARELTO) 15 MG TABS tablet Take 1 tablet (15 mg total) by mouth daily with supper. 02/01/17  Yes Jolaine Artist, MD  sucroferric oxyhydroxide (VELPHORO) 500 MG chewable tablet Chew 500 mg by mouth 3 (three) times daily as needed (high phosphorus).    Yes Historical Provider, MD  terazosin (HYTRIN) 5 MG capsule Take 1 capsule (5 mg total) by mouth every morning. 01/08/17  Yes Thompson Grayer, MD  Testosterone Cypionate 200 MG/ML KIT Inject 1 mL into the muscle every 14 (fourteen) days.   Yes Historical Provider, MD  torsemide (DEMADEX) 20 MG tablet Take 40 mg by mouth every morning.    Yes Historical Provider, MD  traMADol (ULTRAM) 50 MG tablet Take 50 mg by mouth every 6 (six) hours as needed for pain. 04/05/17 05/05/17 Yes Historical Provider, MD  zolpidem (AMBIEN) 10 MG tablet Take 10 mg by mouth at bedtime as needed for sleep.    Yes Historical Provider, MD    Family History Family History  Problem Relation Age of Onset  . Heart attack Father   . Stroke Father   . Stroke Mother   . Allergies    . Asthma    . Heart disease    . Cancer      Social History Social History  Substance Use Topics  . Smoking status: Former Smoker    Years: 28.00    Types: Pipe, Cigars    Quit date: 12/25/1995  . Smokeless tobacco: Never Used  . Alcohol use No     Comment: 3-5 beer or wine per week     Allergies   Actos  [pioglitazone hydrochloride]; Codeine; Depakote [divalproex sodium]; Diltiazem; Hydralazine; Isosorbide dinitrate; Januvia [sitagliptin]; Loop diuretics; Losartan; Nsaids; Onglyza [saxagliptin]; Orudis [ketoprofen]; Penicillins; Potassium iodide; Rozerem [ramelteon]; Amiodarone; Latex; Verapamil; Benazepril hcl; Indomethacin; Minocycline; Pentazocine  lactate; Poison ivy extract [poison ivy extract]; Sertraline hcl; and Shellfish allergy   Review of Systems Review of Systems  Constitutional: Negative for fever.       Generalized weakness  Gastrointestinal:       Post surgical lower abdominal swelling.   Genitourinary:       Scrotal swelling per HPI  Neurological:       Confusion per HPI  All other systems reviewed and are negative.    Physical Exam Updated Vital Signs BP 139/78 (BP Location: Left Arm)   Pulse (!) 59   Temp 97.5 F (36.4 C) (Oral)   Resp 16   SpO2 93%   Physical Exam  Constitutional: He appears well-developed and well-nourished.  HENT:  Head: Normocephalic and atraumatic.  Cardiovascular: Normal rate.   Pulmonary/Chest: Effort normal.  Abdominal:  There is ecchymosis and bruising over the suprapubic region without tenderness.  Genitourinary:  Genitourinary Comments: Scrotum and penis is edematous. Evidence of blisters over the shaft of the penis. Not circumsized.  Neurological: He is alert.  Oriented to self, location, and time; however the patient is sluggish with response.  Skin: Skin is warm and dry.  Psychiatric: He has a normal mood and affect.  Nursing note and vitals reviewed.          ED Treatments / Results  Labs (all labs ordered are listed, but only abnormal results are displayed) Labs Reviewed  COMPREHENSIVE METABOLIC PANEL - Abnormal; Notable for the following:       Result Value   Sodium 129 (*)    Chloride 91 (*)    Glucose, Bld 150 (*)    BUN 133 (*)    Creatinine, Ser 4.33 (*)    Total Protein 6.0 (*)    Albumin 3.3 (*)     ALT 9 (*)    Total Bilirubin 1.6 (*)    GFR calc non Af Amer 12 (*)    GFR calc Af Amer 14 (*)    All other components within normal limits  CBC WITH DIFFERENTIAL/PLATELET - Abnormal; Notable for the following:    RBC 3.93 (*)    Hemoglobin 12.8 (*)    HCT 36.7 (*)    RDW 17.3 (*)    Platelets 72 (*)    All other components within normal limits  AMMONIA - Abnormal; Notable for the following:    Ammonia 79 (*)    All other components within normal limits  PROTIME-INR - Abnormal; Notable for the following:    Prothrombin Time 22.9 (*)    All other components within normal limits  BODY FLUID CULTURE  APTT  LACTATE DEHYDROGENASE, BODY FLUID  GLUCOSE, PLEURAL OR PERITONEAL FLUID  PROTEIN, PLEURAL OR PERITONEAL FLUID  ALBUMIN, PLEURAL OR PERITONEAL FLUID  BODY FLUID CELL COUNT WITH DIFFERENTIAL    EKG  EKG Interpretation None       Radiology No results found.  Procedures Procedures (including critical care time)  CRITICAL CARE Performed by: Varney Biles   Total critical care time: 55 minutes  Critical care time was exclusive of separately billable procedures and treating other patients.  Critical care was necessary to treat or prevent imminent or life-threatening deterioration.  Critical care was time spent personally by me on the following activities: development of treatment plan with patient and/or surrogate as well as nursing, discussions with consultants, evaluation of patient's response to treatment, examination of patient, obtaining history from patient or surrogate, ordering and performing treatments and interventions, ordering and review of  laboratory studies, ordering and review of radiographic studies, pulse oximetry and re-evaluation of patient's condition.   Medications Ordered in ED Medications  lactulose (CHRONULAC) 10 GM/15ML solution 30 g (30 g Oral Given 04/08/17 0527)  0.9 %  sodium chloride infusion ( Intravenous New Bag/Given 04/08/17 0517)      Initial Impression / Assessment and Plan / ED Course  I have reviewed the triage vital signs and the nursing notes.  Pertinent labs & imaging results that were available during my care of the patient were reviewed by me and considered in my medical decision making (see chart for details).     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 Pt is on xarelto.  Pt is POD # 3 today. There is increased confusion, increased weakness. Ammonia ordered. Labs ordered. Based on lab results, we have hepatic encephalopathy and uremic encephalopathy. Lactulose given. Pt also started on 100 cc/hr NS.  I doubt SBP. But we will still check the fluid for SBP. I was informed by the charge nurse that there is no dialysis nurse at [facility] - so this will be required to be done at [facility] hospital.  I spoke with Dr. Laurette Schimke T, Surgeon at [facility]. He clear patient from their end and is ok with the abd binder to be released. Wound care requested from their side.  Medicine to admit. Dr. Hollie Salk, Nephrology is aware about the patient.  Pt placed in reverse trendelenburg to see if the scrotal swelling comes down. He has no pain.  Final Clinical Impressions(s) / ED Diagnoses   Final diagnoses:  Scrotal edema  Penile edema  S/P hernia repair  Acute hepatic encephalopathy  Acute on chronic renal insufficiency  Uremic encephalopathy    New Prescriptions New Prescriptions   No medications on file   I personally performed the services described in this documentation, which was scribed in my presence. The recorded information has been reviewed and is accurate.    Varney Biles, MD 04/08/17 [phone]

## 2017-04-08 NOTE — Progress Notes (Signed)
New Admission Note:    Arrival Method: Stretcher from [facility] ER Mental Orientation: Alert and Oriented x 4 Assessment: WNL Skin: Surgical wound from PD Catheter recent and Swollen Scrotum that has blistered IV: RFA PIV Pain: 0/10 Safety Measures: Siderails up and call bell in reach Admission: Completed 6E Orientation: Completed Family:  At bedside  Orders have been reviewed and implemented.  Patient Placed on Telemetry Will continue to monitor the patient.

## 2017-04-08 NOTE — ED Notes (Signed)
Bed: IP38 Expected date:  Expected time:  Means of arrival:  Comments: 76 yo M/Abd pain

## 2017-04-08 NOTE — ED Triage Notes (Signed)
Pt is presented from home, c/o of abdominal pain and scrotal swelling that he attributes to the surgical hernia repair he had 2 days ago. Adds that the pain and swelling seem t worsen with movement/activity.

## 2017-04-08 NOTE — Progress Notes (Signed)
Received signout from Dr. Kathrynn Humble [name] is a 76 year old male with pmh of CHF, afib on Xarelto, CADs/p CABG, liver failure, and renal failure; who presents with abdominal pain and confusion. Recently had laparoscopic abdominal hernia surgery by Dr. Raul Del at [facility] on 4/13 with intraperitoneal catheter placement for peritoneal dialysis. An abdominal binder was placed on the patient and it appears to have caused swelling of his scrotum and penis. [place] surgery called and okayed discontinuation of abdominal binder and states that peritoneal catheter is okay to be accessed for dialysis.   Vital signs otherwise noted to be stable. Labs revealed BUN 133, creatinine 4.33, and ammonia 79.   Patient will need to be transferred to [facility] to receive his peritoneal dialysis. He was also given a dose of lactulose while in the ED as he was recently not taking this medication. Dr. Kathrynn Humble currently trying to notify nephrology regarding patient.

## 2017-04-08 NOTE — H&P (Addendum)
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 He underwent umbilical hernia repair and peritoneal catheter placement 4-13 by Dr Raul Del. Patient had abdominal binder in place. Patient also notice swelling of scrotum and penis. ED physician discussed with Dr Raul Del, it is ok to remove binder.  Patient now report improvement of abdominal discomfort. He denies chest pain, dyspnea. He has been notice to be confuse. Patient stop taking lactulose prior to surgery. No history of fever, mild cough.  He also report been scratch on his left hand by a dog. Dog is up todate with vaccine.   ED Course: Patient was mildly confuse, notice to have swelling scrotum and penis. Sodium at 129, BUN 133, Cr 4.3, ammonia 79, Bili 1.6, Hb at 12, Platelet 72, INR 2.0  Review of Systems: As per HPI otherwise 10 point review of systems negative.    Past Medical History:  Diagnosis Date  . A-fib ([place])    permanent with tachy-brady syndrome  . Anemia   . Anxiety   . Atrial flutter (HCC)    hx of  . Biceps tendon tear    right  . Bone marrow disease   . Cancer ([place])    basal cell  . CHF (congestive heart failure) ([place])   . Chronic kidney disease (CKD), stage III (moderate)    lov note dr Koleen Nimrod nephrology 09-17-13 on chart  . Complication of anesthesia 11-16-13   required alot of versed per anesthesia with cataract surgery  .  Coronary heart disease    stent, CABG, RBBB  . DDD (degenerative disc disease)   . Diabetes ([place])   . Diabetes mellitus   . Fungal toenail infection   . Gout   . Hemorrhagic cystitis 2012  . Hepatitis    mono  . HTN (hypertension)   . Hypercalcemia 2011   due to sarcoidiosis  . Myocardial infarction [facility])    1995, 2002, 2006  . OSA (obstructive sleep apnea)    use bipap setting of 10 and 12  . Osteoarthritis   . Pneumonia 1966, 2011   hx of  . Presence of permanent cardiac pacemaker   . Right sciatic nerve pain    for block thursday 01-21-2014, injections  . Sarcoid 2011   pulmonary and bone marrow  . Shortness of breath dyspnea   . Synovial cyst of lumbar spine    l3-l4, l4-l5, injections  . Valvular heart disease    aortic stenosis/regurgitation    Past Surgical History:  Procedure Laterality Date  . BASAL CELL CARCINOMA EXCISION Right 2015   ear  . CARDIAC CATHETERIZATION N/A 12/10/2016   Procedure: Right Heart Cath;  Surgeon: Jolaine Artist, MD;  Location: [facility] CV LAB;  Service: Cardiovascular;  Laterality: N/A;  . CARDIOVERSION N/A 12/30/2013   Procedure: CARDIOVERSION;  Surgeon: [name], MD;  Location: [facility] ENDOSCOPY;  Service: Cardiovascular;  Laterality: N/A;  10:49 cardioversion  at 120 joules, then 150 joules, to SB  used  Lido 61m,  Propofol 160 mcg  . CARDIOVERSION  2003, 2006, 2012, 2013  . cataract surgery Right 2007  . cataract surgery Left 11-16-13  . COLONOSCOPY N/A 01/29/2014   Procedure: COLONOSCOPY;  Surgeon: JWinfield Cunas, MD;  [facility];  Service: Endoscopy;  Laterality: N/A;  amanda//ja  . CORONARY ARTERY BYPASS GRAFT  2006   x 5  . EP IMPLANTABLE DEVICE N/A 05/05/2015   Procedure: Pacemaker Implant;  Surgeon: JThompson Grayer MD;  [facility];  Service: Cardiovascular;  Laterality: N/A;  . FLEXIBLE SIGMOIDOSCOPY N/A 12/07/2015   Procedure: FLEXIBLE SIGMOIDOSCOPY;  Surgeon: VWilford Corner MD;   [facility];  Service: Endoscopy;  Laterality: N/A;  Unprepped  . INSERT / REPLACE / REMOVE PACEMAKER  05/05/2015  . PORTACATH PLACEMENT    . portacath removed    . Redo Median sternotomy, extracorporeal cirulation, AVR, Tricuspid valve repair  06/13/2011   AVR(23-mm Edwards pericardial Magna-Ease valve./ TVrepair (34-mm Edwards MC3 annuloplasty ring  . TOTAL KNEE ARTHROPLASTY Right 05/24/2014   Procedure: RIGHT TOTAL KNEE ARTHROPLASTY;  Surgeon: FGearlean Alf MD;  Location: [facility] ORS;  Service: Orthopedics;  Laterality: Right;  . TRANSURETHRAL RESECTION OF PROSTATE  oct. 2012   TURP  . VASECTOMY  1977     reports that he quit smoking about 21 years ago. His smoking use included Pipe and Cigars. He quit after 28.00 years of use. He has never used smokeless tobacco. He reports that he does not drink alcohol or use drugs.  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   Allergies  Allergen Reactions  . Actos [Pioglitazone Hydrochloride] Swelling and Other (See Comments)    Severe peripheral edema  . Codeine Other (See Comments)    Makes Pt aggressive  . Depakote [Divalproex Sodium] Diarrhea    severe  . Diltiazem Other (See Comments)    lethargy and dyspnea  . Hydralazine Other (See Comments)    Severe chills and SOB   . Isosorbide Dinitrate Other (See Comments)    Severe chills and SOB   . Januvia [Sitagliptin] Diarrhea and Other (See Comments)    bradycardia  . Loop Diuretics Other (See Comments)    Spike in BUN and creatine levels  . Losartan Other (See Comments)    aphasia  . Nsaids Other (See Comments)    Renal problems  . Onglyza [Saxagliptin] Diarrhea and Other (See Comments)    bradycardia  . Orudis [Ketoprofen] Other (See Comments)    Cannot take due to renal insufficiency  . Penicillins Swelling and Other (See Comments)    Serum sickness Has patient had a PCN reaction causing immediate rash, facial/tongue/throat swelling, SOB or lightheadedness with hypotension:  Yes Has patient had a PCN reaction causing severe rash involving mucus membranes or skin necrosis: No Has patient had a PCN reaction that required hospitalization Yes Has patient had a PCN reaction occurring within the last 10 years: No If all of the above answers are "NO", then may proceed with Cephalosporin use.   .Marland KitchenPotassium Iodide Itching and Rash    Severe entire body itching and rash  . Rozerem [Ramelteon] Diarrhea    Severe   . Amiodarone Other (See Comments)    diarrhea  . Latex Swelling  . Verapamil Other (See Comments)    Severe fatigue  . Benazepril Hcl Other (See Comments)    ? Possible lowers platelets  . Indomethacin Other (  See Comments)    Renal problems   . Minocycline Other (See Comments)    Makes dizzy and felt just lousy.  [name] Pentazocine Lactate Other (See Comments)    Unknown allergic reaction - pt and wife do not recall this  . Poison Ivy Extract [Poison Ivy Extract] Rash and Other (See Comments)    blisters  . Sertraline Hcl Other (See Comments)    Pt and wife do not recall this  . Shellfish Allergy Rash    Family History  Problem Relation Age of Onset  . Heart attack Father   . Stroke Father   . Stroke Mother   . Allergies    . Asthma    . Heart disease    . Cancer       Prior to Admission medications   Medication Sig Start Date End Date Taking? Authorizing Provider  acetaminophen (TYLENOL) 500 MG tablet Take 1,000 mg by mouth 2 (two) times daily as needed (pain).    Yes Historical Provider, MD  allopurinol (ZYLOPRIM) 100 MG tablet Take 200 mg by mouth daily with breakfast.  08/06/11  Yes [name], MD  cetirizine (ZYRTEC) 10 MG tablet Take 10 mg by mouth daily as needed for allergies.    Yes Historical Provider, MD  diazepam (VALIUM) 10 MG tablet Take 10 mg by mouth daily as needed (ankle and leg cramps).  04/29/15  Yes Historical Provider, MD  dicyclomine (BENTYL) 10 MG capsule Take 10 mg by mouth every morning.    Yes Historical Provider,  MD  diphenoxylate-atropine (LOMOTIL) 2.5-0.025 MG per tablet Take 2 tablets by mouth 2 (two) times daily as needed for diarrhea or loose stools. stomach 04/06/15  Yes Historical Provider, MD  insulin glargine (LANTUS) 100 unit/mL SOPN Inject 2-4 Units into the skin daily as needed (CBG >120). CBG 120-140 2 units, 141-160 3 units, 161-180 4 units, 181-200 5 units, 201-220 6 units   Yes Historical Provider, MD  lactulose (CHRONULAC) 10 GM/15ML solution Take 20 g by mouth 3 (three) times daily as needed for constipation. 03/22/17  Yes Historical Provider, MD  metolazone (ZAROXOLYN) 2.5 MG tablet TAKE ONE TABLET BY MOUTH ONCE WEEKLY ON FRIDAYS  AS NEEDED WITH 20MEQ POTASSIUM 03/27/17  Yes Jolaine Artist, MD  metoprolol (LOPRESSOR) 50 MG tablet Take 1 tablet (50 mg total) by mouth 2 (two) times daily. 09/26/16  Yes Thayer Headings, MD  potassium chloride SA (K-DUR,KLOR-CON) 20 MEQ tablet Take 20 mEq by mouth every Saturday.   Yes Historical Provider, MD  Rivaroxaban (XARELTO) 15 MG TABS tablet Take 1 tablet (15 mg total) by mouth daily with supper. 02/01/17  Yes Jolaine Artist, MD  sucroferric oxyhydroxide (VELPHORO) 500 MG chewable tablet Chew 500 mg by mouth 3 (three) times daily as needed (high phosphorus).    Yes Historical Provider, MD  terazosin (HYTRIN) 5 MG capsule Take 1 capsule (5 mg total) by mouth every morning. 01/08/17  Yes Thompson Grayer, MD  Testosterone Cypionate 200 MG/ML KIT Inject 1 mL into the muscle every 14 (fourteen) days.   Yes Historical Provider, MD  torsemide (DEMADEX) 20 MG tablet Take 40 mg by mouth every morning.    Yes Historical Provider, MD  traMADol (ULTRAM) 50 MG tablet Take 50 mg by mouth every 6 (six) hours as needed for pain. 04/05/17 05/05/17 Yes Historical Provider, MD  zolpidem (AMBIEN) 10 MG tablet Take 10 mg by mouth at bedtime as needed for sleep.    Yes Historical Provider, MD  Physical Exam: Vitals:   04/08/17 0235 04/08/17 0511 04/08/17 0750  BP: 129/87  139/78 (!) 141/74  Pulse: 62 (!) 59 66  Resp: 18 16 16   Temp: 97.5 F (36.4 C)    TempSrc: Oral    SpO2: 90% 93% 95%    Constitutional: NAD, calm, comfortable Vitals:   04/08/17 0235 04/08/17 0511 04/08/17 0750  BP: 129/87 139/78 (!) 141/74  Pulse: 62 (!) 59 66  Resp: 18 16 16   Temp: 97.5 F (36.4 C)    TempSrc: Oral    SpO2: 90% 93% 95%   Eyes: PERRL, lids and conjunctivae normal ENMT: Mucous membranes are moist. Posterior pharynx clear of any exudate or lesions.Normal dentition.  Neck: normal, supple, no masses, no thyromegaly Respiratory: clear to auscultation bilaterally, no wheezing, no crackles. Normal respiratory effort. No accessory muscle use.  Cardiovascular: Regular rate and rhythm, no murmurs / rubs / gallops. No extremity edema. 2+ pedal pulses. No carotid bruits.  Abdomen: no tenderness, no masses palpated. Distended, clean bandage umbilical area, another smaller clean bandage left lower quadrant. Petechial rah pelvis area, lower quadrant. Edema scrotum and penis.  Musculoskeletal: no clubbing / cyanosis. No joint deformity upper and lower extremities. Good ROM, no contractures. Normal muscle tone.  Skin: left hand, fore arm with open wound two skin tear, bloody, mild redness.  Neurologic: CN 2-12 grossly intact. Sensation intact, DTR normal. Strength 5/5 in all 4. Confusion improving per wife.  Psychiatric:  Normal mood.     Labs on Admission: I have personally reviewed following labs and imaging studies  CBC:  Recent Labs Lab 04/08/17 0337  WBC 7.1  NEUTROABS 5.6  HGB 12.8*  HCT 36.7*  MCV 93.4  PLT 72*   Basic Metabolic Panel:  Recent Labs Lab 04/08/17 0337  NA 129*  K 4.2  CL 91*  CO2 25  GLUCOSE 150*  BUN 133*  CREATININE 4.33*  CALCIUM 9.3   GFR: CrCl cannot be calculated (Unknown ideal weight.). Liver Function Tests:  Recent Labs Lab 04/08/17 0337  AST 20  ALT 9*  ALKPHOS 72  BILITOT 1.6*  PROT 6.0*  ALBUMIN 3.3*   No  results for input(s): LIPASE, AMYLASE in the last 168 hours.  Recent Labs Lab 04/08/17 0331  AMMONIA 79*   Coagulation Profile:  Recent Labs Lab 04/08/17 0337  INR 2.00   Cardiac Enzymes: No results for input(s): CKTOTAL, CKMB, CKMBINDEX, TROPONINI in the last 168 hours. BNP (last 3 results) No results for input(s): PROBNP in the last 8760 hours. HbA1C: No results for input(s): HGBA1C in the last 72 hours. CBG: No results for input(s): GLUCAP in the last 168 hours. Lipid Profile: No results for input(s): CHOL, HDL, LDLCALC, TRIG, CHOLHDL, LDLDIRECT in the last 72 hours. Thyroid Function Tests: No results for input(s): TSH, T4TOTAL, FREET4, T3FREE, THYROIDAB in the last 72 hours. Anemia Panel: No results for input(s): VITAMINB12, FOLATE, FERRITIN, TIBC, IRON, RETICCTPCT in the last 72 hours. Urine analysis:    Component Value Date/Time   COLORURINE YELLOW 06/30/2016 [place] 06/30/2016 1434   LABSPEC 1.011 06/30/2016 1434   LABSPEC 1.010 11/19/2007 1015   PHURINE 5.5 06/30/2016 1434   GLUCOSEU NEGATIVE 06/30/2016 1434   HGBUR NEGATIVE 06/30/2016 1434   BILIRUBINUR NEGATIVE 06/30/2016 1434   BILIRUBINUR Negative 11/19/2007 [place] 06/30/2016 1434   PROTEINUR NEGATIVE 06/30/2016 1434   UROBILINOGEN 0.2 09/07/2015 0052   NITRITE NEGATIVE 06/30/2016 1434   LEUKOCYTESUR NEGATIVE 06/30/2016 1434  LEUKOCYTESUR Negative 11/19/2007 1015    Radiological Exams on Admission: No results found.  EKG: routine   Assessment/Plan Active Problems:   Ischemic heart disease   Hx of CABG   Diabetes (HCC)   Essential hypertension   Chronic atrial fibrillation (HCC)   Aortic valve replaced   S/P placement of cardiac pacemaker   CHF (congestive heart failure) (HCC)   Ascites   Confusion   Acute hepatic encephalopathy   ESRD (end stage renal disease) ([place])  1-Acute hepatic Encephalopathy;  Patient presents with confusion. Ammonia level at  79.  Continue with lactulose.  Repeat labs in am.   2-ESRD; patient underwent peritoneal catheter placement.  Nephrology consulted. Plan is to start peritoneal Dialysis.  Will hold diuretics, patient to start Peritoneal dialysis.   3-Abdominal discomfort, recent umbilical hernia repair, scrotal swelling.  Binder removed.  Surgery consulted to help with post op care and evaluation.   4-A fib; continue with metoprolol and xarelto.  Might need to revise with cardiology anticoagulation, patient with liver diseases.  Repeat INR in am.  No evidence of active bleeding.   5-left hand, arm wound, scratch by Dog; mild redness. Will start 3 days antibiotics prophylaxis. Wound care consulted.   6-Chronic Thrombocytopenia. Stable.   7-Chronic combine Diastolic HF; appears compensated.  Will defer diuretics to nephrology   8-Cardiac Cirrhosis, . Bilirubin stable.  Patient gets paracentesis, last 3-14. Also had 2 L removed during recent surgery 4-13 Continue with lactulose.  Paracentesis PRN.   DVT prophylaxis: on xarelto  Code Status: Full Code. Family Communication: care discussed with wife who was at bedside./  Disposition Plan:  To be determine.  Consults called: nephrology, surgery  Admission status: inpatient, telemetry    Niel Hummer A MD [facility] Pager [phone]  If 7PM-7AM, please contact night-coverage [email or web address] Password [email or web address]  04/08/2017, 10:03 AM

## 2017-04-09 DIAGNOSIS — I259 Chronic ischemic heart disease, unspecified: Secondary | ICD-10-CM

## 2017-04-09 DIAGNOSIS — I10 Essential (primary) hypertension: Secondary | ICD-10-CM

## 2017-04-09 DIAGNOSIS — N184 Chronic kidney disease, stage 4 (severe): Secondary | ICD-10-CM

## 2017-04-09 DIAGNOSIS — Z794 Long term (current) use of insulin: Secondary | ICD-10-CM

## 2017-04-09 DIAGNOSIS — I482 Chronic atrial fibrillation: Secondary | ICD-10-CM

## 2017-04-09 DIAGNOSIS — Z951 Presence of aortocoronary bypass graft: Secondary | ICD-10-CM

## 2017-04-09 DIAGNOSIS — R188 Other ascites: Secondary | ICD-10-CM

## 2017-04-09 DIAGNOSIS — R410 Disorientation, unspecified: Secondary | ICD-10-CM

## 2017-04-09 DIAGNOSIS — E1122 Type 2 diabetes mellitus with diabetic chronic kidney disease: Secondary | ICD-10-CM

## 2017-04-09 DIAGNOSIS — Z952 Presence of prosthetic heart valve: Secondary | ICD-10-CM

## 2017-04-09 LAB — CBC
HCT: 38.3 % — ABNORMAL LOW (ref 39.0–52.0)
Hemoglobin: 13 g/dL (ref 13.0–17.0)
MCH: 31.7 pg (ref 26.0–34.0)
MCHC: 33.9 g/dL (ref 30.0–36.0)
MCV: 93.4 fL (ref 78.0–100.0)
PLATELETS: 71 10*3/uL — AB (ref 150–400)
RBC: 4.1 MIL/uL — ABNORMAL LOW (ref 4.22–5.81)
RDW: 17.5 % — AB (ref 11.5–15.5)
WBC: 6.9 10*3/uL (ref 4.0–10.5)

## 2017-04-09 LAB — AMMONIA: Ammonia: 84 umol/L — ABNORMAL HIGH (ref 9–35)

## 2017-04-09 LAB — BASIC METABOLIC PANEL
Anion gap: 14 (ref 5–15)
BUN: 113 mg/dL — AB (ref 6–20)
CALCIUM: 8.9 mg/dL (ref 8.9–10.3)
CO2: 23 mmol/L (ref 22–32)
CREATININE: 3.95 mg/dL — AB (ref 0.61–1.24)
Chloride: 94 mmol/L — ABNORMAL LOW (ref 101–111)
GFR calc Af Amer: 16 mL/min — ABNORMAL LOW (ref >60)
GFR, EST NON AFRICAN AMERICAN: 13 mL/min — AB (ref >60)
Glucose, Bld: 127 mg/dL — ABNORMAL HIGH (ref 65–99)
Potassium: 3.4 mmol/L — ABNORMAL LOW (ref 3.5–5.1)
Sodium: 131 mmol/L — ABNORMAL LOW (ref 135–145)

## 2017-04-09 LAB — PROTIME-INR
INR: 1.94
PROTHROMBIN TIME: 22.4 s — AB (ref 11.4–15.2)

## 2017-04-09 MED ORDER — COUMADIN BOOK
Freq: Once | Status: AC
  Administered 2017-04-09: 18:00:00
  Filled 2017-04-09: qty 1

## 2017-04-09 MED ORDER — TERAZOSIN HCL 5 MG PO CAPS
5.0000 mg | ORAL_CAPSULE | ORAL | Status: DC
  Administered 2017-04-09 – 2017-04-16 (×8): 5 mg via ORAL
  Filled 2017-04-09 (×9): qty 1

## 2017-04-09 MED ORDER — MUPIROCIN 2 % EX OINT
TOPICAL_OINTMENT | CUTANEOUS | Status: AC
  Administered 2017-04-09: 11:00:00
  Filled 2017-04-09: qty 22

## 2017-04-09 MED ORDER — DICYCLOMINE HCL 10 MG PO CAPS
10.0000 mg | ORAL_CAPSULE | Freq: Every day | ORAL | Status: DC
  Administered 2017-04-09 – 2017-04-10 (×2): 10 mg via ORAL
  Filled 2017-04-09 (×3): qty 1

## 2017-04-09 MED ORDER — TORSEMIDE 20 MG PO TABS
40.0000 mg | ORAL_TABLET | Freq: Every day | ORAL | Status: DC
  Administered 2017-04-10 – 2017-04-16 (×7): 40 mg via ORAL
  Filled 2017-04-09 (×8): qty 2

## 2017-04-09 MED ORDER — WARFARIN SODIUM 5 MG PO TABS
5.0000 mg | ORAL_TABLET | Freq: Once | ORAL | Status: AC
  Administered 2017-04-09: 5 mg via ORAL
  Filled 2017-04-09: qty 1

## 2017-04-09 MED ORDER — WARFARIN VIDEO
Freq: Once | Status: AC
  Administered 2017-04-10: 11:00:00

## 2017-04-09 MED ORDER — WARFARIN - PHARMACIST DOSING INPATIENT: Freq: Every day | Status: DC

## 2017-04-09 NOTE — Progress Notes (Signed)
Pt complaining of abdominal pain, distention. He expressed some concerns about initiating PD this evening given that this will be his first PD exchange. Spoke with on call Surgery MD and he recommended we wait to proceed with PD until in the morning. On call PD nurse spoke with Nephrologist and he agreed. Patient was medicated for pain. Will continue to monitor.

## 2017-04-09 NOTE — Progress Notes (Signed)
PROGRESS NOTE    [name], MD  DXI:[id number]  DOB: Apr 17, 1941  DOA: 04/08/2017 PCP: Leotis Pain, DO Outpatient Specialists: surgeon N[facility] course: 76 y.o. 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 He underwent umbilical hernia repair and peritoneal catheter placement 4-13 by Dr Raul Del. Patient had abdominal binder in place. Patient also notice swelling of scrotum and penis. ED physician discussed with Dr Raul Del, it is ok to remove binder.  Patient was mildly confuse, notice to have swelling scrotum and penis. Sodium at 129, BUN 133, Cr 4.3, ammonia 79, Bili 1.6, Hb at 12, Platelet 72, INR 2.0  Assessment & Plan:   1-Acute hepatic Encephalopathy;  Patient presents with confusion. Ammonia level at 79 on admission, repeat testing ordered.  Continue with lactulose.  Repeat labs in am.   2-ESRD; patient underwent peritoneal catheter placement.  Nephrology consulted. Plan is to start peritoneal Dialysis.  Will hold diuretics, patient to start Peritoneal dialysis 04/09/17.   3-Abdominal discomfort, recent umbilical hernia repair, scrotal swelling.  Binder removed.  Surgery consulted to help with post op care and evaluation. Please see consult notes.   4-A fib; continue with metoprolol and xarelto.  Might need to revise with cardiology anticoagulation, patient with liver diseases.  Repeat INR in am.  I spoke with pharmacist and nephrology, they feel xarelto is not recommended for ESRD, warfarin is safest choice for patient, he did receive 1 dose of xarelto already, I asked pharmacy to dose warfarin for patient.     5-left hand, arm wound, scratch by Dog; mild redness. Will start 3 days antibiotics  prophylaxis. Wound care consult appreciated.   6-Chronic Thrombocytopenia. Stable.   7-Chronic combine Diastolic HF; appears compensated.  Patient starting PD 4/17  8-Cardiac Cirrhosis, . Bilirubin stable.  Patient gets paracentesis treatments for this, last 3-14. Also had 2 L removed during recent surgery 4-13 Continue with lactulose.  Paracentesis PRN.   DVT prophylaxis: warfarin  Code Status: Full Code. Family Communication: care discussed with wife who was at bedside./  Disposition Plan:  To be determined.  Consults called: nephrology, surgery  Family Communication: wife at bedside  Consultants:  CCS   nephrology  Subjective: Pt ready to start his PD treatment  Objective: Vitals:   04/08/17 1804 04/08/17 2028 04/09/17 0458 04/09/17 0940  BP: (!) 156/85 (!) 147/78 (!) 161/72 (!) 148/53  Pulse: (!) 113 62 (!) 56 63  Resp: 18 17 16 14   Temp: 97.9 F (36.6 C) 97.7 F (36.5 C) 98 F (36.7 C) 97.9 F (36.6 C)  TempSrc: Oral   Oral  SpO2: 92% 96% 97% 97%  Weight: 96.6 kg (213 lb) 97.1 kg (214 lb)    Height: 5\' 11"  (1.803 m)       Intake/Output Summary (Last 24 hours) at 04/09/17 1453 Last data filed at 04/09/17 0600  Gross per 24 hour  Intake              120 ml  Output             1175 ml  Net            -1055 ml   Filed Weights   04/08/17 1804 04/08/17 2028  Weight: 96.6 kg (213 lb)  97.1 kg (214 lb)    Exam:  General exam: sitting up in chair, NAD.  Respiratory system: Clear. No increased work of breathing. Cardiovascular system: S1 & S2 heard.  Gastrointestinal system: Abdomen is mildly distended. Normal bowel sounds heard. Central nervous system: Alert and oriented. No focal neurological deficits. Extremities: no clubbing or cyanosis.  Data Reviewed: Basic Metabolic Panel:  Recent Labs Lab 04/08/17 0337 04/09/17 0433  NA 129* 131*  K 4.2 3.4*  CL 91* 94*  CO2 25 23  GLUCOSE 150* 127*  BUN 133* 113*  CREATININE 4.33* 3.95*  CALCIUM  9.3 8.9   Liver Function Tests:  Recent Labs Lab 04/08/17 0337  AST 20  ALT 9*  ALKPHOS 72  BILITOT 1.6*  PROT 6.0*  ALBUMIN 3.3*   No results for input(s): LIPASE, AMYLASE in the last 168 hours.  Recent Labs Lab 04/08/17 0331  AMMONIA 79*   CBC:  Recent Labs Lab 04/08/17 0337 04/09/17 0433  WBC 7.1 6.9  NEUTROABS 5.6  --   HGB 12.8* 13.0  HCT 36.7* 38.3*  MCV 93.4 93.4  PLT 72* 71*   Cardiac Enzymes: No results for input(s): CKTOTAL, CKMB, CKMBINDEX, TROPONINI in the last 168 hours. CBG (last 3)   Recent Labs  04/08/17 1810  GLUCAP 144*   No results found for this or any previous visit (from the past 240 hour(s)).   Studies: No results found.  Scheduled Meds: . allopurinol  200 mg Oral Q breakfast  . ciprofloxacin  250 mg Oral Daily  . dicyclomine  10 mg Oral Daily  . lactulose  20 g Oral TID  . loratadine  10 mg Oral Daily  . metoprolol  50 mg Oral BID  . mupirocin cream   Topical BID  . sodium chloride flush  3 mL Intravenous Q12H  . terazosin  5 mg Oral BH-q7a   Continuous Infusions: . sodium chloride      Active Problems:   Ischemic heart disease   Hx of CABG   Diabetes (HCC)   Essential hypertension   Chronic atrial fibrillation (HCC)   Aortic valve replaced   S/P placement of cardiac pacemaker   CHF (congestive heart failure) (HCC)   Ascites   Confusion   Acute hepatic encephalopathy   ESRD (end stage renal disease) ([place])  Time spent:   Irwin Brakeman, MD, FAAFP [facility] Pager [phone]  If 7PM-7AM, please contact night-coverage [email or web address] Password [email or web address] 04/09/2017, 2:53 PM    LOS: 1 day

## 2017-04-09 NOTE — Progress Notes (Signed)
  [facility] Progress Note   Assessment/ Plan:   1 abd and scrotal swelling: some vol overload- had 2L fluid removed in PD cath; suspect abd binder may have caused some fluid to pool in uncomfortable places 2 CKD V--> ESRD: supine PD with 1L fill vols to clear some uremia- have also spoken to home training to schedule him; can start 4/24 3 Atrial fibrillation: on metoprolol.  Don't recommend Xarelto in ESRD; has switched to warfarin 4.  Elevated ammonia- mild HE; lactulose TID 5.  Combined CHF: appears a little vol overloaded- PD will help this  6.  Hyponatremia: has already received IVF; will try to add on urine and serum osms, think it's due to mild overload  Subjective:    PD not started overnight due to concerns over tenderness of the abdomen.  Plan for PD today- supine, low volume.   Objective:   BP (!) 148/53 (BP Location: Right Arm)   Pulse 63   Temp 97.9 F (36.6 C) (Oral)   Resp 14   Ht 5\' 11"  (1.803 m)   Wt 97.1 kg (214 lb)   SpO2 97%   BMI 29.85 kg/m   Physical Exam: GEN older gentleman, NAD, sitting in chair HEENT EOMI, PERRL, tacky MM NECK + JVD PULM muffled at bases CV: irregular, III/VI systolic murmur at L and R upper sternal border (but heard throughout precordium) ABD obese, laparoscopic port incisions dressed, c/d/I, PD cath dressed as well.  Some ecchymoses but no frank hematoma EXT 2+ LE edema GU: penile swelling NEURO: mild asterixis, some confusion  Labs: BMET  Recent Labs Lab 04/08/17 0337 04/09/17 0433  NA 129* 131*  K 4.2 3.4*  CL 91* 94*  CO2 25 23  GLUCOSE 150* 127*  BUN 133* 113*  CREATININE 4.33* 3.95*  CALCIUM 9.3 8.9   CBC  Recent Labs Lab 04/08/17 0337 04/09/17 0433  WBC 7.1 6.9  NEUTROABS 5.6  --   HGB 12.8* 13.0  HCT 36.7* 38.3*  MCV 93.4 93.4  PLT 72* 71*    @IMGRELPRIORS @ Medications:    . allopurinol  200 mg Oral Q breakfast  . ciprofloxacin  250 mg Oral Daily  . dicyclomine  10 mg Oral  Daily  . lactulose  20 g Oral TID  . loratadine  10 mg Oral Daily  . metoprolol  50 mg Oral BID  . mupirocin cream   Topical BID  . sodium chloride flush  3 mL Intravenous Q12H  . terazosin  5 mg Oral BH-q7a     Madelon Lips MD 04/09/2017, 2:26 PM

## 2017-04-09 NOTE — Care Management Note (Signed)
Case Management Note  Patient Details  Name: [name] JOST, [name] MRN: [id number] Date of Birth: 11-27-1941  Subjective/Objective:                 Patient admitted from home wife for confusion and abdominal pain s/p hernia surgery. ESRD on PD, received 4/17 while inpatient.   Action/Plan:  CM will continue to follow.  Expected Discharge Date:                  Expected Discharge Plan:  Home/Self Care  In-House Referral:     Discharge planning Services  CM Consult  Post Acute Care Choice:    Choice offered to:     DME Arranged:    DME Agency:     HH Arranged:    HH Agency:     Status of Service:  In process, will continue to follow  If discussed at Long Length of Stay Meetings, dates discussed:    Additional Comments:  Carles Collet, RN 04/09/2017, 4:48 PM

## 2017-04-09 NOTE — Progress Notes (Signed)
ANTICOAGULATION CONSULT NOTE - Initial Consult  Pharmacy Consult for Xarelto >> Warfarin Indication: atrial fibrillation  Allergies  Allergen Reactions  . Actos [Pioglitazone Hydrochloride] Swelling and Other (See Comments)    Severe peripheral edema  . Codeine Other (See Comments)    Makes Pt aggressive  . Depakote [Divalproex Sodium] Diarrhea    severe  . Diltiazem Other (See Comments)    lethargy and dyspnea  . Hydralazine Other (See Comments)    Severe chills and SOB   . Isosorbide Dinitrate Other (See Comments)    Severe chills and SOB   . Januvia [Sitagliptin] Diarrhea and Other (See Comments)    bradycardia  . Loop Diuretics Other (See Comments)    Spike in BUN and creatine levels  . Losartan Other (See Comments)    aphasia  . Nsaids Other (See Comments)    Renal problems  . Onglyza [Saxagliptin] Diarrhea and Other (See Comments)    bradycardia  . Orudis [Ketoprofen] Other (See Comments)    Cannot take due to renal insufficiency  . Penicillins Swelling and Other (See Comments)    Serum sickness Has patient had a PCN reaction causing immediate rash, facial/tongue/throat swelling, SOB or lightheadedness with hypotension: Yes Has patient had a PCN reaction causing severe rash involving mucus membranes or skin necrosis: No Has patient had a PCN reaction that required hospitalization Yes Has patient had a PCN reaction occurring within the last 10 years: No If all of the above answers are "NO", then may proceed with Cephalosporin use.   [name] Potassium Iodide Itching and Rash    Severe entire body itching and rash  . Rozerem [Ramelteon] Diarrhea    Severe   . Amiodarone Other (See Comments)    diarrhea  . Latex Swelling  . Verapamil Other (See Comments)    Severe fatigue  . Benazepril Hcl Other (See Comments)    ? Possible lowers platelets  . Indomethacin Other (See Comments)    Renal problems   . Minocycline Other (See Comments)    Makes dizzy and felt just  lousy.  [name] Pentazocine Lactate Other (See Comments)    Unknown allergic reaction - pt and wife do not recall this  . Poison Ivy Extract [Poison Ivy Extract] Rash and Other (See Comments)    blisters  . Sertraline Hcl Other (See Comments)    Pt and wife do not recall this  . Shellfish Allergy Rash    Patient Measurements: Height: 5\' 11"  (180.3 cm) Weight: 214 lb (97.1 kg) IBW/kg (Calculated) : 75.3  Vital Signs: Temp: 97.9 F (36.6 C) (04/17 0940) Temp Source: Oral (04/17 0940) BP: 148/53 (04/17 0940) Pulse Rate: 63 (04/17 0940)  Labs:  Recent Labs  04/08/17 0337 04/09/17 0433  HGB 12.8* 13.0  HCT 36.7* 38.3*  PLT 72* 71*  APTT 36  --   LABPROT 22.9*  --   INR 2.00  --   CREATININE 4.33* 3.95*    Estimated Creatinine Clearance: 18.6 mL/min (A) (by C-G formula based on SCr of 3.95 mg/dL (H)).   Medical History: Past Medical History:  Diagnosis Date  . A-fib ([place])    permanent with tachy-brady syndrome  . Anemia   . Anxiety   . Atrial flutter ([facility])    hx of  . Biceps tendon tear    right  . Bone marrow disease   . 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 CHF (congestive heart failure) ([place])   . Chronic kidney disease (CKD), stage  III (moderate)    lov note dr Koleen Nimrod nephrology 09-17-13 on chart  . Complication of anesthesia 11-16-13   required alot of versed per anesthesia with cataract surgery  . Coronary heart disease    stent, CABG, RBBB  . DDD (degenerative disc disease)   . Diabetes ([place])   . Diabetes mellitus   . Fungal toenail infection   . Gout   . Hemorrhagic cystitis 2012  . Hepatitis    mono  . HTN (hypertension)   . Hypercalcemia 2011   due to sarcoidiosis  . Myocardial infarction [facility])    1995, 2002, 2006  . OSA (obstructive sleep apnea)    use bipap setting of 10 and 12  . Osteoarthritis   . Pneumonia 1966, 2011   hx of  . Presence of permanent cardiac pacemaker   . Right sciatic nerve pain    for block thursday 01-21-2014, injections   . Sarcoid 2011   pulmonary and bone marrow  . Shortness of breath dyspnea   . Synovial cyst of lumbar spine    l3-l4, l4-l5, injections  . Valvular heart disease    aortic stenosis/regurgitation    Assessment: 6 YOM with CKD V/new ESRD with plans to start peritoneal dialysis this admission. The patient was on Xarelto 15 mg once daily PTA for anticoagulation however with new ESRD/PD plans are now to transition the patient to warfarin. This has been discussed with the family and they are on board with the transition.  The patient's INR on admission (4/16) was falsely at 2 given the patient's recent Xarelto use. The patient did receive another Xarelto dose on 4/16 evening and the INR today remains falsely elevated at 1.96. Until Xarelto's effects on the INR are diminished - pharmacy will be "dosing blindly" in regards to warfarin. Will start the patient on a lower dose and watch INR trends (will likely trend down and then back up).  Goal of Therapy:  INR 2-3   Plan:  1. Warfarin 5 mg x 1 dose at 1800 today 2. Warfarin book/video 3. Daily PT/INR 4. Will continue to monitor for any signs/symptoms of bleeding and will follow up with PT/INR in the a.m.   Thank you for allowing pharmacy to be a part of this patient's care.  Alycia Rossetti, PharmD, BCPS Clinical Pharmacist Pager: [phone] Clinical phone for 04/09/2017 from 7a-3:30p: [phone] If after 3:30p, please call main pharmacy at: [phone] 04/09/2017 11:33 AM

## 2017-04-10 ENCOUNTER — Encounter (HOSPITAL_COMMUNITY): Payer: Self-pay | Admitting: Internal Medicine

## 2017-04-10 DIAGNOSIS — N5089 Other specified disorders of the male genital organs: Secondary | ICD-10-CM

## 2017-04-10 LAB — RENAL FUNCTION PANEL
Albumin: 2.7 g/dL — ABNORMAL LOW (ref 3.5–5.0)
Anion gap: 14 (ref 5–15)
BUN: 99 mg/dL — AB (ref 6–20)
CHLORIDE: 92 mmol/L — AB (ref 101–111)
CO2: 25 mmol/L (ref 22–32)
CREATININE: 3.48 mg/dL — AB (ref 0.61–1.24)
Calcium: 8.7 mg/dL — ABNORMAL LOW (ref 8.9–10.3)
GFR calc non Af Amer: 16 mL/min — ABNORMAL LOW (ref >60)
GFR, EST AFRICAN AMERICAN: 18 mL/min — AB (ref >60)
Glucose, Bld: 131 mg/dL — ABNORMAL HIGH (ref 65–99)
POTASSIUM: 3.3 mmol/L — AB (ref 3.5–5.1)
Phosphorus: 4.5 mg/dL (ref 2.5–4.6)
Sodium: 131 mmol/L — ABNORMAL LOW (ref 135–145)

## 2017-04-10 LAB — BODY FLUID CELL COUNT WITH DIFFERENTIAL
Eos, Fluid: 1 %
LYMPHS FL: 20 %
Monocyte-Macrophage-Serous Fluid: 30 % — ABNORMAL LOW (ref 50–90)
NEUTROPHIL FLUID: 49 % — AB (ref 0–25)
WBC FLUID: 22 uL (ref 0–1000)

## 2017-04-10 LAB — HEPATITIS B SURFACE ANTIBODY,QUALITATIVE: Hep B S Ab: NONREACTIVE

## 2017-04-10 LAB — PROTIME-INR
INR: 2
PROTHROMBIN TIME: 23 s — AB (ref 11.4–15.2)

## 2017-04-10 LAB — HEPATITIS B SURFACE ANTIGEN: Hepatitis B Surface Ag: NEGATIVE

## 2017-04-10 LAB — HEPATITIS B CORE ANTIBODY, TOTAL: HEP B C TOTAL AB: NEGATIVE

## 2017-04-10 LAB — OSMOLALITY: Osmolality: 313 mOsm/kg — ABNORMAL HIGH (ref 275–295)

## 2017-04-10 LAB — AMMONIA: AMMONIA: 66 umol/L — AB (ref 9–35)

## 2017-04-10 MED ORDER — GENTAMICIN SULFATE 0.1 % EX CREA
1.0000 "application " | TOPICAL_CREAM | Freq: Every day | CUTANEOUS | Status: DC
  Administered 2017-04-10 – 2017-04-16 (×7): 1 via TOPICAL
  Filled 2017-04-10: qty 15

## 2017-04-10 MED ORDER — OXYCODONE-ACETAMINOPHEN 5-325 MG PO TABS
2.0000 | ORAL_TABLET | Freq: Once | ORAL | Status: AC
  Administered 2017-04-10: 2 via ORAL
  Filled 2017-04-10: qty 2

## 2017-04-10 MED ORDER — DELFLEX-LC/1.5% DEXTROSE 344 MOSM/L IP SOLN
1000.0000 mL | INTRAPERITONEAL | Status: DC
  Administered 2017-04-10: 1000 mL via INTRAPERITONEAL

## 2017-04-10 MED ORDER — WARFARIN SODIUM 5 MG PO TABS
5.0000 mg | ORAL_TABLET | Freq: Once | ORAL | Status: AC
Start: 2017-04-10 — End: 2017-04-10
  Administered 2017-04-10: 5 mg via ORAL
  Filled 2017-04-10: qty 1

## 2017-04-10 MED ORDER — DELFLEX-LC/1.5% DEXTROSE 344 MOSM/L IP SOLN: 1000.0000 mL | INTRAPERITONEAL | Status: DC | PRN

## 2017-04-10 MED ORDER — DIPHENHYDRAMINE HCL 25 MG PO CAPS: 50.0000 mg | ORAL_CAPSULE | Freq: Once | ORAL | Status: DC

## 2017-04-10 NOTE — Progress Notes (Signed)
[place] NOTE  [name] Fiddler, MD SNK:[id number] DOB: 03/16/1941 DOA: 04/08/2017 PCP: Leotis Pain, DO   Brief HPI:    76 y.o.malewith medical history significant of ESRD, CHF combine last EF at 45 % by ECHO 2017, , CAD S/P CABG 2006, A fib on Xarelto,aortic stenosis and tricuspid regurgitation status post bioprosthetic AVR 2012, Liver Failure who presents complaining of abdominal discomfort that started overnight. He underwent umbilical hernia repair and peritoneal catheter placement 4-13 by Dr Raul Del. Patient had abdominal binder in place. Patient also notice swelling of scrotum and penis. ED physician discussed with Dr Raul Del, it is ok to remove binder.  Patient was mildly confuse, notice to have swelling scrotum and penis. Sodium at 129, BUN 133, Cr 4.3, ammonia 79, Bili 1.6, Hb at 12, Platelet 72, INR 2.0    Subjective   Patient seen and examined, ammonia level 66 this morning.   Assessment/Plan:     1. Acute hepatic encephalopathy- patient came with confusion ammonia was 79 on admission. Started on lactulose. This morning ammonia is 66. Continue lactulose 20 g 3 times a day. Recheck ammonia level in a.m. 2. ESRD- patient underwent peritoneal catheter placement. Started on peritoneal dialysis on 04/09/2017. 3. Scrotal swelling- patient recently underwent laparoscopically assisted obliquity repair and peritoneal catheter placement at [facility]. He was seen by surgery for scrotal swelling, the swelling likely from edema and no active bleeding. Gen. surgery recommends patient to be followed up by surgeon who did the procedure. 4. Atrial fibrillation- continue metoprolol 50 mg by mouth twice a day, Xarelto was changed to warfarin as it was not recommended for ESRD patient.  5. Left hand wound- patient had left hand wound caused by dog scratch, started on Bactroban cream topically on left hand. 6. Chronic combined systolic and diastolic CHF- appears  compensated. Started on peritoneal dialysis on 04/09/2017 as above. 7. Congestive hepatopathy- patient gets paracentesis, last paracentesis was on 03/06/2017. Also had 2 L removed during recent surgery on 04/05/2017. Continue lactulose. When necessary paracentesis.    DVT prophylaxis: Warfarin  Code Status: Full code  Family Communication: No family present at bedside   Disposition Plan: to be determined   Consultants:  Nephrology  Procedures:  None   Continuous infusions . sodium chloride    . dialysis solution 1.5% low-MG/low-CA        Antibiotics:   Anti-infectives    Start     Dose/Rate Route Frequency Ordered Stop   04/08/17 1030  ciprofloxacin (CIPRO) tablet 250 mg     250 mg Oral Daily 04/08/17 0945 04/10/17 1026       Objective   Vitals:   04/09/17 0940 04/09/17 2022 04/10/17 0413 04/10/17 0952  BP: (!) 148/53 131/71 (!) 150/66 126/67  Pulse: 63 (!) 52 (!) 56 (!) 58  Resp: 14 16 14 16   Temp: 97.9 F (36.6 C) 97.8 F (36.6 C) 98.3 F (36.8 C) 98.5 F (36.9 C)  TempSrc: Oral   Oral  SpO2: 97% 93% 97% 99%  Weight:  97.5 kg (215 lb)    Height:        Intake/Output Summary (Last 24 hours) at 04/10/17 1707 Last data filed at 04/10/17 1100  Gross per 24 hour  Intake             1240 ml  Output              775 ml  Net  465 ml   Filed Weights   04/08/17 1804 04/08/17 2028 04/09/17 2022  Weight: 96.6 kg (213 lb) 97.1 kg (214 lb) 97.5 kg (215 lb)     Physical Examination:  General exam: Appears calm and comfortable. Respiratory system: Clear to auscultation. Respiratory effort normal. Cardiovascular system:  RRR. No  murmurs, rubs, gallops. No pedal edema. GI system: Abdomen is nondistended, soft and nontender. No organomegaly.  Central nervous system. No focal neurological deficits. 5 x 5 power in all extremities. Skin: No rashes, lesions or ulcers. Psychiatry: Alert, oriented x 3.Judgement and insight appear normal. Affect  normal.    Data Reviewed: I have personally reviewed following labs and imaging studies  CBG:  Recent Labs Lab 04/08/17 1810  GLUCAP 144*    CBC:  Recent Labs Lab 04/08/17 0337 04/09/17 0433  WBC 7.1 6.9  NEUTROABS 5.6  --   HGB 12.8* 13.0  HCT 36.7* 38.3*  MCV 93.4 93.4  PLT 72* 71*    Basic Metabolic Panel:  Recent Labs Lab 04/08/17 0337 04/09/17 0433 04/10/17 0900  NA 129* 131* 131*  K 4.2 3.4* 3.3*  CL 91* 94* 92*  CO2 25 23 25   GLUCOSE 150* 127* 131*  BUN 133* 113* 99*  CREATININE 4.33* 3.95* 3.48*  CALCIUM 9.3 8.9 8.7*  PHOS  --   --  4.5    No results found for this or any previous visit (from the past 240 hour(s)).   Liver Function Tests:  Recent Labs Lab 04/08/17 0337 04/10/17 0900  AST 20  --   ALT 9*  --   ALKPHOS 72  --   BILITOT 1.6*  --   PROT 6.0*  --   ALBUMIN 3.3* 2.7*   No results for input(s): LIPASE, AMYLASE in the last 168 hours.  Recent Labs Lab 04/08/17 0331 04/09/17 1814 04/10/17 0444  AMMONIA 79* 84* 66*       Studies: No results found.  Scheduled Meds: . allopurinol  200 mg Oral Q breakfast  . dicyclomine  10 mg Oral Daily  . diphenhydrAMINE  50 mg Oral Once  . gentamicin cream  1 application Topical Daily  . lactulose  20 g Oral TID  . loratadine  10 mg Oral Daily  . metoprolol  50 mg Oral BID  . mupirocin cream   Topical BID  . sodium chloride flush  3 mL Intravenous Q12H  . terazosin  5 mg Oral BH-q7a  . torsemide  40 mg Oral Daily  . warfarin  5 mg Oral ONCE-1800  . Warfarin - Pharmacist Dosing Inpatient   Does not apply q1800      Time spent: 25 min  [facility] Pager [phone]. If 7PM-7AM, please contact night-coverage at [email or web address], Office  [phone]  password [email or web address] 04/10/2017, 5:07 PM  LOS: 2 days

## 2017-04-10 NOTE — Procedures (Signed)
Patient seen and examined on peritoneal dialysis.  CCPD 1 L fill vols 4 exchanges leaving belly dry.  Tolerating well.  Much more awake and alert.  Talking, asking for bacon.  Madelon Lips MD 4:26 PM

## 2017-04-10 NOTE — Progress Notes (Signed)
  [facility] Progress Note   Assessment/ Plan:   1 abd and scrotal swelling: improving, expect to further improve with UF 2 CKD V--> ESRD: supine PD with 1L fill vols to clear some uremia- have also spoken to home training to schedule him; can start 4/24.  CCPD today. 3 Atrial fibrillation: on metoprolol.  Don't recommend Xarelto in ESRD; has switched to warfarin 4.  Elevated ammonia- mild HE; lactulose TID, improving 5.  Combined CHF: appears a little vol overloaded- PD will help this; home torsemide resumed  6.  Hyponatremia: improving  Subjective:    CAPD 2 exchanges completed without incident yesterday- today CCPD   Objective:   BP (!) 150/66   Pulse (!) 56   Temp 98.3 F (36.8 C)   Resp 14   Ht 5\' 11"  (1.803 m)   Wt 97.5 kg (215 lb)   SpO2 97%   BMI 29.99 kg/m   Physical Exam: GEN older gentleman, lying in bed, wife at bedside HEENT EOMI, PERRL, tacky MM NECK no JVD today PULM muffled at bases CV: irregular, III/VI systolic murmur at L and R upper sternal border (but heard throughout precordium) ABD obese, laparoscopic port incisions dressed, c/d/I, PD cath dressed as well.  Some ecchymoses but no frank hematoma EXT 1+ LE edema GU: penile swelling NEURO: mild asterixis, some confusion  Labs: BMET  Recent Labs Lab 04/08/17 0337 04/09/17 0433  NA 129* 131*  K 4.2 3.4*  CL 91* 94*  CO2 25 23  GLUCOSE 150* 127*  BUN 133* 113*  CREATININE 4.33* 3.95*  CALCIUM 9.3 8.9   CBC  Recent Labs Lab 04/08/17 0337 04/09/17 0433  WBC 7.1 6.9  NEUTROABS 5.6  --   HGB 12.8* 13.0  HCT 36.7* 38.3*  MCV 93.4 93.4  PLT 72* 71*    @IMGRELPRIORS @ Medications:    . allopurinol  200 mg Oral Q breakfast  . ciprofloxacin  250 mg Oral Daily  . dicyclomine  10 mg Oral Daily  . diphenhydrAMINE  50 mg Oral Once  . gentamicin cream  1 application Topical Daily  . lactulose  20 g Oral TID  . loratadine  10 mg Oral Daily  . metoprolol  50 mg Oral BID  .  mupirocin cream   Topical BID  . sodium chloride flush  3 mL Intravenous Q12H  . terazosin  5 mg Oral BH-q7a  . torsemide  40 mg Oral Daily  . warfarin   Does not apply Once  . Warfarin - Pharmacist Dosing Inpatient   Does not apply q1800     Madelon Lips MD 04/10/2017, 8:48 AM

## 2017-04-10 NOTE — Progress Notes (Signed)
Patient on home CPAP at this time.

## 2017-04-10 NOTE — Progress Notes (Signed)
Pt has home CPAP at bedside that he places himself on and off.

## 2017-04-10 NOTE — Progress Notes (Signed)
ANTICOAGULATION CONSULT NOTE - Follow Up Consult  Pharmacy Consult for Coumadin Indication: atrial fibrillation  Patient Measurements: Height: 5\' 11"  (180.3 cm) Weight: 215 lb (97.5 kg) IBW/kg (Calculated) : 75.3  Vital Signs: Temp: 98.5 F (36.9 C) (04/18 0952) Temp Source: Oral (04/18 0952) BP: 126/67 (04/18 0952) Pulse Rate: 58 (04/18 0952)  Labs:  Recent Labs  04/08/17 0337 04/09/17 0433 04/09/17 1409 04/10/17 0444 04/10/17 0900  HGB 12.8* 13.0  --   --   --   HCT 36.7* 38.3*  --   --   --   PLT 72* 71*  --   --   --   APTT 36  --   --   --   --   LABPROT 22.9*  --  22.4* 23.0*  --   INR 2.00  --  1.94 2.00  --   CREATININE 4.33* 3.95*  --   --  3.48*    Assessment: 76 YOM with CKD V/new ESRD with peritoneal dialysis initialed on 04/09/17. The patient was on Xarelto 15 mg once daily PTA for anticoagulation however with new ESRD/PD plans are now transitioning to Coumadin. This has been discussed with the family and they are on board with the transition.  INR on admission (4/16) was falsely elevated at 2.00, given the recent Xarelto use. Another Xarelto dose on 4/16 evening and the INR today remains falsely elevated at 2.00. Until Xarelto's effects on the INR are diminished - pharmacy will be "dosing blindly" in regards to Coumadin. Coumadin 5 mg x 1 given on 04/09/17, with plans to watch INR trends (will likely trend down and then back up).  Goal of Therapy:  INR 2-3 Monitor platelets by anticoagulation protocol: Yes   Plan:   Coumadin 5 mg again today.  Daily PT/INR.  Discussed with patient.  Arty Baumgartner,  Pager: [phone] 04/10/2017,12:04 PM

## 2017-04-10 NOTE — Consult Note (Signed)
[facility] Primary Care Navigator  04/10/2017  [name], MD 03/25/1941 [id number]    Met with patientand wife [name]) at the bedside to identify possible discharge needs. Wife reports that patient was noted to have swelling of scrotum and penis and was mildly confuse which had led to this admission. Patient has End Stage Renal Disease and currently receiving peritoneal dialysis. Patient's wife endorses Dr. Rikki Spearing with [facility] as theprimary care provider.   Wife shared using [facility] obtain medications without any problem.   Patient statesmanaging his ownmedications at home with wife's assistance.   Wifeprovides transportation to his doctors' appointments and son Delfino Lovett- who lives close by) assists with transportation as needed.   Patient's wife is the primary caregiver at home as stated.  Anticipated discharge plan is home with wife. Wife states that she had made MD aware of need for home health services at home.  Patient and wife voiced understanding to call primary care provider's office when hereturns home, for a post discharge follow-up appointment within a week or sooner if needed.Patient letter (with PCP's contact number) was provided as theirreminder.  Discussed withpatientand wife about [facility] CM services available for healthmanagement and wife states that they are "managing fairly well" with diet, monitoring and medications. She reports that Dr. Haroldine Laws helps in managing HF and primary care provider assist in managing DM. She denies further health management needs or concerns at this time. [facility] care management contact information provided for future needs that may arise.  For questions, please contact:  [name], BSN, RN- [facility] Primary Care Navigator  Telephone: [phone] [place]

## 2017-04-10 NOTE — Discharge Instructions (Signed)

## 2017-04-11 LAB — RENAL FUNCTION PANEL
ALBUMIN: 2.5 g/dL — AB (ref 3.5–5.0)
ANION GAP: 10 (ref 5–15)
BUN: 92 mg/dL — ABNORMAL HIGH (ref 6–20)
CHLORIDE: 93 mmol/L — AB (ref 101–111)
CO2: 28 mmol/L (ref 22–32)
Calcium: 8.6 mg/dL — ABNORMAL LOW (ref 8.9–10.3)
Creatinine, Ser: 3.03 mg/dL — ABNORMAL HIGH (ref 0.61–1.24)
GFR calc Af Amer: 21 mL/min — ABNORMAL LOW (ref >60)
GFR calc non Af Amer: 18 mL/min — ABNORMAL LOW (ref >60)
Glucose, Bld: 156 mg/dL — ABNORMAL HIGH (ref 65–99)
PHOSPHORUS: 4.4 mg/dL (ref 2.5–4.6)
POTASSIUM: 3.3 mmol/L — AB (ref 3.5–5.1)
SODIUM: 131 mmol/L — AB (ref 135–145)

## 2017-04-11 LAB — PATHOLOGIST SMEAR REVIEW

## 2017-04-11 LAB — PROTIME-INR
INR: 3.9
PROTHROMBIN TIME: 39.2 s — AB (ref 11.4–15.2)

## 2017-04-11 LAB — AMMONIA: Ammonia: 47 umol/L — ABNORMAL HIGH (ref 9–35)

## 2017-04-11 MED ORDER — ALLOPURINOL 100 MG PO TABS
100.0000 mg | ORAL_TABLET | Freq: Every day | ORAL | Status: DC
  Administered 2017-04-12 – 2017-04-16 (×5): 100 mg via ORAL
  Filled 2017-04-11 (×5): qty 1

## 2017-04-11 MED ORDER — DELFLEX-LC/2.5% DEXTROSE 394 MOSM/L IP SOLN: INTRAPERITONEAL | Status: DC

## 2017-04-11 MED ORDER — DELFLEX-LC/2.5% DEXTROSE 394 MOSM/L IP SOLN
INTRAPERITONEAL | Status: DC | PRN
  Filled 2017-04-11: qty 5000

## 2017-04-11 NOTE — Progress Notes (Signed)
[place] NOTE  [name] Fiddler, MD KGY:[id number] DOB: Feb 19, 1941 DOA: 04/08/2017 PCP: Leotis Pain, DO   Brief HPI:    76 y.o.malewith medical history significant of ESRD, CHF combine last EF at 45 % by ECHO 2017, , CAD S/P CABG 2006, A fib on Xarelto,aortic stenosis and tricuspid regurgitation status post bioprosthetic AVR 2012, Liver Failure who presents complaining of abdominal discomfort that started overnight. He underwent umbilical hernia repair and peritoneal catheter placement 4-13 by Dr Raul Del. Patient had abdominal binder in place. Patient also notice swelling of scrotum and penis. ED physician discussed with Dr Raul Del, it is ok to remove binder.  Patient was mildly confuse, notice to have swelling scrotum and penis. Sodium at 129, BUN 133, Cr 4.3, ammonia 79, Bili 1.6, Hb at 12, Platelet 72, INR 2.0    Subjective   Patient seen and examined, mentally more clear. Ammonia level is 47 this morning.   Assessment/Plan:     1. Acute hepatic encephalopathy- patient came with confusion ammonia was 79 on admission. Started on lactulose. This morning ammonia is 47. Continue lactulose 20 g 3 times a day.  2. ESRD- patient underwent peritoneal catheter placement. Started on peritoneal dialysis on 04/09/2017. 3. Scrotal swelling- patient recently underwent laparoscopically assisted obliquity repair and peritoneal catheter placement at [facility]. He was seen by surgery for scrotal swelling, the swelling likely from edema and no active bleeding. Gen. surgery recommends patient to be followed up by surgeon who did the procedure. 4. Atrial fibrillation- continue metoprolol 50 mg by mouth twice a day, Xarelto was changed to warfarin as it was not recommended for ESRD patient.  5. Left hand wound- patient had left hand wound caused by dog scratch, started on Bactroban cream topically on left hand. 6. Chronic combined systolic and diastolic CHF- appears compensated.  Started on peritoneal dialysis on 04/09/2017 as above.  7. Congestive hepatopathy- patient gets paracentesis, last paracentesis was on 03/06/2017. Also had 2 L removed during recent surgery on 04/05/2017. Continue lactulose. When necessary paracentesis.    DVT prophylaxis: Warfarin  Code Status: Full code  Family Communication: No family present at bedside   Disposition Plan: to be determined   Consultants:  Nephrology  Procedures:  None   Continuous infusions . sodium chloride    . dialysis solution 2.5% low-MG/low-CA        Antibiotics:   Anti-infectives    Start     Dose/Rate Route Frequency Ordered Stop   04/08/17 1030  ciprofloxacin (CIPRO) tablet 250 mg     250 mg Oral Daily 04/08/17 0945 04/10/17 1026       Objective   Vitals:   04/10/17 1600 04/10/17 2042 04/11/17 0500 04/11/17 0900  BP: 115/60 (!) 141/64 131/70 132/63  Pulse: 62 61 60 (!) 54  Resp: 18 18 18 18   Temp: 98.1 F (36.7 C) 98.4 F (36.9 C) 98.6 F (37 C) 98.1 F (36.7 C)  TempSrc: Oral Oral Oral Oral  SpO2: 100% 93% 97% 99%  Weight:      Height:        Intake/Output Summary (Last 24 hours) at 04/11/17 1329 Last data filed at 04/11/17 0900  Gross per 24 hour  Intake             3980 ml  Output             3660 ml  Net              320  ml   Filed Weights   04/08/17 1804 04/08/17 2028 04/09/17 2022  Weight: 96.6 kg (213 lb) 97.1 kg (214 lb) 97.5 kg (215 lb)     Physical Exam  Physical Exam: Eyes: No icterus, extraocular muscles intact  Mouth: Oral mucosa is moist, no lesions on palate,  Neck: Supple, no deformities, masses, or tenderness Lungs: Normal respiratory effort, bilateral clear to auscultation, no crackles or wheezes.  Heart: Regular rate and rhythm, S1 and S2 normal, no murmurs, rubs auscultated Abdomen: BS normoactive,soft,nondistended,non-tender to palpation,no organomegaly Extremities: No pretibial edema, no erythema, no cyanosis, no clubbing Neuro : Alert  and oriented to time, place and person, No focal deficits     Data Reviewed: I have personally reviewed following labs and imaging studies  CBG:  Recent Labs Lab 04/08/17 1810  GLUCAP 144*    CBC:  Recent Labs Lab 04/08/17 0337 04/09/17 0433  WBC 7.1 6.9  NEUTROABS 5.6  --   HGB 12.8* 13.0  HCT 36.7* 38.3*  MCV 93.4 93.4  PLT 72* 71*    Basic Metabolic Panel:  Recent Labs Lab 04/08/17 0337 04/09/17 0433 04/10/17 0900 04/11/17 0946  NA 129* 131* 131* 131*  K 4.2 3.4* 3.3* 3.3*  CL 91* 94* 92* 93*  CO2 25 23 25 28   GLUCOSE 150* 127* 131* 156*  BUN 133* 113* 99* 92*  CREATININE 4.33* 3.95* 3.48* 3.03*  CALCIUM 9.3 8.9 8.7* 8.6*  PHOS  --   --  4.5 4.4    No results found for this or any previous visit (from the past 240 hour(s)).   Liver Function Tests:  Recent Labs Lab 04/08/17 0337 04/10/17 0900 04/11/17 0946  AST 20  --   --   ALT 9*  --   --   ALKPHOS 72  --   --   BILITOT 1.6*  --   --   PROT 6.0*  --   --   ALBUMIN 3.3* 2.7* 2.5*   No results for input(s): LIPASE, AMYLASE in the last 168 hours.  Recent Labs Lab 04/08/17 0331 04/09/17 1814 04/10/17 0444 04/11/17 0408  AMMONIA 79* 84* 66* 47*       Studies: No results found.  Scheduled Meds: . [START ON 04/12/2017] allopurinol  100 mg Oral Q breakfast  . diphenhydrAMINE  50 mg Oral Once  . gentamicin cream  1 application Topical Daily  . lactulose  20 g Oral TID  . loratadine  10 mg Oral Daily  . metoprolol  50 mg Oral BID  . mupirocin cream   Topical BID  . sodium chloride flush  3 mL Intravenous Q12H  . terazosin  5 mg Oral BH-q7a  . torsemide  40 mg Oral Daily  . Warfarin - Pharmacist Dosing Inpatient   Does not apply q1800      Time spent: 25 min  [facility] Pager [phone]. If 7PM-7AM, please contact night-coverage at [email or web address], Office  [phone]  password [email or web address] 04/11/2017, 1:29 PM  LOS: 3 days

## 2017-04-11 NOTE — Progress Notes (Signed)
  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  Continue CCPD, 2.5% dianeal solution today 3 Atrial fibrillation: on metoprolol.  Don't recommend Xarelto in ESRD; has switched to warfarin--> caution with liver dz 4.  Elevated ammonia- mild HE; lactulose TID, improving 5.  Combined CHF: appears a little vol overloaded- PD will help this; home torsemide 40 mg daily 6.  Hyponatremia: improving 7.  Dispo: have discussed with pt and wife, likely Saturday, pt's wife would like a hospital bed (she will d/w CM)  Subjective:    Tolerating CCPD but drain times are a little long due to him not being able to move around a lot   Objective:   BP 132/63 (BP Location: Right Arm)   Pulse (!) 54   Temp 98.1 F (36.7 C) (Oral)   Resp 18   Ht 5\' 11"  (1.803 m)   Wt 97.5 kg (215 lb)   SpO2 99%   BMI 29.99 kg/m   Physical Exam: GEN older gentleman, lying in bed, wife at bedside HEENT EOMI, PERRL, MMM NECK no JVD today PULM muffled at bases CV: irregular, III/VI systolic murmur at L and R upper sternal border (but heard throughout precordium) ABD obese, laparoscopic port incisions dressed, c/d/I, PD cath dressed as well.  Some ecchymoses but no frank hematoma EXT trace LE edema NEURO: no asterixis or confusion today  Labs: BMET  Recent Labs Lab 04/08/17 0337 04/09/17 0433 04/10/17 0900 04/11/17 0946  NA 129* 131* 131* 131*  K 4.2 3.4* 3.3* 3.3*  CL 91* 94* 92* 93*  CO2 25 23 25 28   GLUCOSE 150* 127* 131* 156*  BUN 133* 113* 99* 92*  CREATININE 4.33* 3.95* 3.48* 3.03*  CALCIUM 9.3 8.9 8.7* 8.6*  PHOS  --   --  4.5 4.4   CBC  Recent Labs Lab 04/08/17 0337 04/09/17 0433  WBC 7.1 6.9  NEUTROABS 5.6  --   HGB 12.8* 13.0  HCT 36.7* 38.3*  MCV 93.4 93.4  PLT  72* 71*    @IMGRELPRIORS @ Medications:    . [START ON 04/12/2017] allopurinol  100 mg Oral Q breakfast  . diphenhydrAMINE  50 mg Oral Once  . gentamicin cream  1 application Topical Daily  . lactulose  20 g Oral TID  . loratadine  10 mg Oral Daily  . metoprolol  50 mg Oral BID  . mupirocin cream   Topical BID  . sodium chloride flush  3 mL Intravenous Q12H  . terazosin  5 mg Oral BH-q7a  . torsemide  40 mg Oral Daily  . Warfarin - Pharmacist Dosing Inpatient   Does not apply q1800     Madelon Lips MD 04/11/2017, 1:18 PM

## 2017-04-11 NOTE — Progress Notes (Signed)
RT came to place patient on CPAP. Patient is on his home unit already. Patient needs no assistance in putting on mask.

## 2017-04-11 NOTE — Progress Notes (Signed)
ANTICOAGULATION CONSULT NOTE - Follow Up Consult  Pharmacy Consult for warfarin Indication: atrial fibrillation  Patient Measurements: Height: 5\' 11"  (180.3 cm) Weight: 215 lb (97.5 kg) IBW/kg (Calculated) : 75.3  Vital Signs: Temp: 98.1 F (36.7 C) (04/19 0900) Temp Source: Oral (04/19 0900) BP: 132/63 (04/19 0900) Pulse Rate: 54 (04/19 0900)  Labs:  Recent Labs  04/09/17 0433 04/09/17 1409 04/10/17 0444 04/10/17 0900 04/11/17 0408  HGB 13.0  --   --   --   --   HCT 38.3*  --   --   --   --   PLT 71*  --   --   --   --   LABPROT  --  22.4* 23.0*  --  39.2*  INR  --  1.94 2.00  --  3.90  CREATININE 3.95*  --   --  3.48*  --     Assessment: 52 YOM with CKD V/new ESRD with peritoneal dialysis initialed on 04/09/17. The patient was on Xarelto 15 mg once daily PTA for anticoagulation however with new ESRD/PD patient was transitioned to warfarin. Last dose of Xarelto was 4/16 in the evening.  INR on admission (4/16) was 2 due to influence of Xarelto. INR stayed stable ~2 until today when it jumped to 3.9. Patient received warfarin 5mg  on 4/17 and 4/18. Jump in INR is likely attributable to warfarin as opposed to Xarelto, though Xarelto is still likely affecting level to some extent given poor renal clearance in this patient.  Hgb 13, plts chronically low at 71. No bleeding noted.  Goal of Therapy:  INR 2-3 Monitor platelets by anticoagulation protocol: Yes   Plan:  -hold warfarin tonight given degree of INR increase -daily INR -CBC tomorrow -follow for s/s bleeding  Prairie Stenberg D. Avaree Gilberti, PharmD, BCPS Clinical Pharmacist Pager: [phone] 04/11/2017 10:57 AM

## 2017-04-11 NOTE — Care Management (Signed)
04/11/2017 MD , pt and wife requesting hospital bed, please order if appropriate.  Jasmine Pang RN MPH, case manager, [phone]

## 2017-04-11 NOTE — Procedures (Signed)
Patient seen and examined on peritoneal dialysis CCPD 4 exchanges 1 L volume no day dwell 2.5% dianeal. Treatment adjusted as needed.  [facility] 1:21 PM

## 2017-04-12 DIAGNOSIS — R5381 Other malaise: Secondary | ICD-10-CM

## 2017-04-12 DIAGNOSIS — N189 Chronic kidney disease, unspecified: Secondary | ICD-10-CM

## 2017-04-12 DIAGNOSIS — N289 Disorder of kidney and ureter, unspecified: Secondary | ICD-10-CM

## 2017-04-12 LAB — AMMONIA: AMMONIA: 97 umol/L — AB (ref 9–35)

## 2017-04-12 LAB — CBC
HEMATOCRIT: 36.2 % — AB (ref 39.0–52.0)
HEMOGLOBIN: 12.5 g/dL — AB (ref 13.0–17.0)
MCH: 31.7 pg (ref 26.0–34.0)
MCHC: 34.5 g/dL (ref 30.0–36.0)
MCV: 91.9 fL (ref 78.0–100.0)
Platelets: 76 10*3/uL — ABNORMAL LOW (ref 150–400)
RBC: 3.94 MIL/uL — AB (ref 4.22–5.81)
RDW: 17 % — ABNORMAL HIGH (ref 11.5–15.5)
WBC: 5.9 10*3/uL (ref 4.0–10.5)

## 2017-04-12 LAB — PROTIME-INR
INR: 5.02 — AB
Prothrombin Time: 48 seconds — ABNORMAL HIGH (ref 11.4–15.2)

## 2017-04-12 NOTE — Progress Notes (Signed)
MD texted elevated INR results this am of 5.02. Awaiting response.

## 2017-04-12 NOTE — Care Management Note (Signed)
Case Management Note  Patient Details  Name: ALIAS VILLAGRAN, MD MRN: [id number] Date of Birth: 1941-04-10  Subjective/Objective:        CM following for progression and d/c planning.             Action/Plan: 04/12/2017 Met with pt wife briefly on 04/11/2017 re her request for a hospital bed for the home. This CM alerted [facility] of request and ask MD to enter orders.  Also noted that pt may be evaluated by CIR for admission , await outcome.Will proceed with order for hospital bed as pt will need this at the time of D/C from hospital or CIR.   Will arrange HH is CIR is not appropriate.   Expected Discharge Date:                  Expected Discharge Plan:  [place]  In-House Referral:  NA  Discharge planning Services  CM Consult  Post Acute Care Choice:  Durable Medical Equipment, Home Health Choice offered to:  Spouse  DME Arranged:  Hospital bed DME Agency:  [place]:    [place]:     Status of Service:  Completed, signed off  If discussed at [place] of Stay Meetings, dates discussed:    Additional Comments:  Adron Bene, RN 04/12/2017, 2:46 PM

## 2017-04-12 NOTE — Progress Notes (Addendum)
I will follow up with pt and wife on Monday for no inpt rehab beds are available over the weekend. [phone] Received call from Dr. Hollie Salk to discuss logistics surrounding new PD start for this pt. Receiving 4 exchanges per day with one hour dwell time in supine. Home training was to begin on 04/16/17 as an out pt. No temporary hemodialysis access to date. Pt's PD schedule would have to be changed to facilitate 3 hrs per day of therapy required of an inpt rehab admission.

## 2017-04-12 NOTE — Progress Notes (Signed)
Rehab Admissions Coordinator Note:  Patient was screened by Cleatrice Burke for appropriateness for an Inpatient Acute Rehab Consult per PT recommendation. Inpt rehab beds are full today and through the weekend. If pt and wife would like to be considered for admission, please order an inpt rehab consult.  Cleatrice Burke 04/12/2017, 1:19 PM  I can be reached at [phone].

## 2017-04-12 NOTE — Evaluation (Signed)
Physical Therapy Evaluation Patient Details Name: [name] GATLING, MD MRN: [id number] DOB: [birth year] Today's Date: 04/12/2017   History of Present Illness  Pt is a 76 yo male admitted through ED on 04/08/17 with AMS resulting from hepatic encephalopathy and ascites. Pt was started on peritoneal dialysis and is planning on continuing this when he returns home. PMH significant for ischemic heart disease, CABG, DM, HTN, A-Fib, cardiac pacemaker, CHF.   Clinical Impression  Pt presents with the above diagnosis and below deficits for therapy evaluation. Prior to admission, pt was living with his wife in a two story home with his main living environment on first level. Pt was ambulatory with AD prior to admission and was able to perform most of his own self-care without assistance. Currently, pt requires total to Max A for mobility and is currently on peritoneal dialysis and will continue this when he is able to return home. Pt may not be able to have this dialysis at a SNF and will require further rehab services in order to assist with improving mobility prior to discharging home. Pt continues to require acute rehab services to address the below deficits prior to discharge. Pt will benefit from a referral to CIR in order to increase his strength prior to returning home.     Follow Up Recommendations CIR;Supervision/Assistance - 24 hour    Equipment Recommendations  Other (comment) (hospital bed)    Recommendations for Other Services Rehab consult;OT consult     Precautions / Restrictions Precautions Precautions: Fall Restrictions Weight Bearing Restrictions: No      Mobility  Bed Mobility Overal bed mobility: Needs Assistance Bed Mobility: Sit to Supine       Sit to supine: Max assist;+2 for physical assistance   General bed mobility comments: Max A for trunk and Le's into bed. Min A to move up in bed with use of UE's on railing  Transfers Overall transfer level: Needs  assistance Equipment used: Ambulation equipment used Transfers: Sit to/from Stand Sit to Stand: +2 physical assistance;+2 safety/equipment;Max assist         General transfer comment: Max A to stand from recliner and pull up onto STEDY. pt is able to stand on steady for hygiene before sitting in bed.   Ambulation/Gait             General Gait Details: unable to perform this session  Stairs            Wheelchair Mobility    Modified Rankin (Stroke Patients Only)       Balance Overall balance assessment: Needs assistance Sitting-balance support: Bilateral upper extremity supported;Feet supported Sitting balance-Leahy Scale: Poor   Postural control: Posterior lean Standing balance support: Bilateral upper extremity supported;During functional activity Standing balance-Leahy Scale: Zero Standing balance comment: Relies on support to perform any standing                             Pertinent Vitals/Pain Pain Assessment: Faces Faces Pain Scale: Hurts even more Pain Location: scrotum due to edema Pain Descriptors / Indicators: Grimacing;Moaning Pain Intervention(s): Monitored during session;Repositioned    Home Living Family/patient expects to be discharged to:: Private residence Living Arrangements: Spouse/significant other Available Help at Discharge: Family;Available 24 hours/day;Friend(s) Type of Home: House Home Access: Stairs to enter Entrance Stairs-Rails: Left Entrance Stairs-Number of Steps: 2 Home Layout: Two level;Able to live on main level with bedroom/bathroom Home Equipment: Walker - 2 wheels;Grab bars - tub/shower;Grab  bars - toilet;Toilet riser;Bedside commode;Wheelchair - manual;Other (comment) (hoyer lift) Additional Comments: attempting to get hospital bed at home.     Prior Function Level of Independence: Independent with assistive device(s)         Comments: was able to perform short distance gait with RW prior to  admission. Became bedbound just prior to hospitalization     Hand Dominance   Dominant Hand: Right    Extremity/Trunk Assessment   Upper Extremity Assessment Upper Extremity Assessment: Defer to OT evaluation    Lower Extremity Assessment Lower Extremity Assessment: Generalized weakness       Communication   Communication: No difficulties  Cognition Arousal/Alertness: Awake/alert Behavior During Therapy: WFL for tasks assessed/performed Overall Cognitive Status: Within Functional Limits for tasks assessed                                        General Comments      Exercises     Assessment/Plan    PT Assessment Patient needs continued PT services  PT Problem List Decreased strength;Decreased activity tolerance;Decreased balance;Decreased mobility;Pain       PT Treatment Interventions DME instruction;Gait training;Functional mobility training;Therapeutic activities;Therapeutic exercise;Balance training;Patient/family education    PT Goals (Current goals can be found in the Care Plan section)  Acute Rehab PT Goals Patient Stated Goal: to get rid of his swelling PT Goal Formulation: With patient/family Time For Goal Achievement: 04/19/17 Potential to Achieve Goals: Good    Frequency Min 3X/week   Barriers to discharge Decreased caregiver support Requires Max A at this time.     Co-evaluation               End of Session Equipment Utilized During Treatment: Gait belt;Other (comment) Charlaine Dalton) Activity Tolerance: Patient limited by fatigue;Patient limited by pain Patient left: in bed;with call bell/phone within reach;with family/visitor present Nurse Communication: Mobility status;Need for lift equipment PT Visit Diagnosis: Muscle weakness (generalized) (M62.81);Difficulty in walking, not elsewhere classified (R26.2)    Time: 7915-0569 PT Time Calculation (min) (ACUTE ONLY): 55 min   Charges:   PT Evaluation $PT Eval Moderate  Complexity: 1 Procedure PT Treatments $Therapeutic Activity: 38-52 mins   PT G Codes:        Scheryl Marten PT, DPT  [phone]   Shanon Rosser 04/12/2017, 1:03 PM

## 2017-04-12 NOTE — Care Management Important Message (Signed)
Important Message  Patient Details  Name: [name] HIDROGO, MD MRN: [id number] Date of Birth: April 22, 1941   Medicare Important Message Given:  Yes    [name] 04/12/2017, 3:20 PM

## 2017-04-12 NOTE — Progress Notes (Signed)
[name] NOTE  [name] Fiddler, MD YBO:[id number] DOB: [birth year] DOA: 04/08/2017 PCP: Leotis Pain, DO   Brief HPI:    76 y.o.malewith medical history significant of ESRD, CHF combine last EF at 45 % by ECHO 2017, , CAD S/P CABG 2006, A fib on Xarelto,aortic stenosis and tricuspid regurgitation status post bioprosthetic AVR 2012, Liver Failure who presents complaining of abdominal discomfort that started overnight. He underwent umbilical hernia repair and peritoneal catheter placement 4-13 by Dr Raul Del. Patient had abdominal binder in place. Patient also notice swelling of scrotum and penis. ED physician discussed with Dr Raul Del, it is ok to remove binder.  Patient was mildly confuse, notice to have swelling scrotum and penis. Sodium at 129, BUN 133, Cr 4.3, ammonia 79, Bili 1.6, Hb at 12, Platelet 72, INR 2.0    Subjective   Patient seen and examined, Denies pain or shortness of breath.   Assessment/Plan:     1. Acute hepatic encephalopathy-Resolved, patient came with confusion ammonia was 79 on admission. Started on lactulose.  Yesterday ammonia was 47. Continue lactulose 20 g 3 times a day.  2. ESRD- patient underwent peritoneal catheter placement. Started on peritoneal dialysis on 04/09/2017. patient is supposed to start PD training on 424 at [facility].  3. Scrotal swelling- patient recently underwent laparoscopically assisted obliquity repair and peritoneal catheter placement at [facility]. He was seen by surgery for scrotal swelling, the swelling likely from edema and no active bleeding. Gen. surgery recommends patient to be followed up by surgeon who did the procedure. no changes.  4. Atrial fibrillation- continue metoprolol 50 mg by mouth twice a day, Xarelto was changed to warfarin as it was not recommended for ESRD patient.  continue warfarin per pharmacy consultation  5. Left hand wound- patient had left hand wound caused by dog scratch, started  on Bactroban cream topically on left hand. 6. Chronic combined systolic and diastolic CHF- appears compensated. Started on peritoneal dialysis on 04/09/2017 as above.  7. Congestive hepatopathy- patient gets paracentesis, last paracentesis was on 03/06/2017. Also had 2 L removed during recent surgery on 04/05/2017. Continue lactulose. When necessary paracentesis.    DVT prophylaxis: Warfarin  Code Status: Full code  Family Communication: No family present at bedside   Disposition Plan: patient and his wife are looking to go to inpatient rehabilitation. Will order CIR consult.    Consultants:  Nephrology  Procedures:  None   Continuous infusions . sodium chloride    . dialysis solution 2.5% low-MG/low-CA        Antibiotics:   Anti-infectives    Start     Dose/Rate Route Frequency Ordered Stop   04/08/17 1030  ciprofloxacin (CIPRO) tablet 250 mg     250 mg Oral Daily 04/08/17 0945 04/10/17 1026       Objective   Vitals:   04/12/17 0555 04/12/17 0908 04/12/17 1044 04/12/17 1046  BP: 131/63 122/67 (!) 144/57 (!) 144/57  Pulse: (!) 55 (!) 53 (!) 53 (!) 53  Resp: 17     Temp: 98 F (36.7 C) 98.2 F (36.8 C)    TempSrc: Oral Oral    SpO2: 98% 96%    Weight:      Height:        Intake/Output Summary (Last 24 hours) at 04/12/17 1405 Last data filed at 04/12/17 0911  Gross per 24 hour  Intake             4008 ml  Output  3060 ml  Net              948 ml   Filed Weights   04/08/17 2028 04/09/17 2022 04/11/17 2000  Weight: 97.1 kg (214 lb) 97.5 kg (215 lb) 102 kg (224 lb 13.9 oz)     Physical Exam  Physical Exam: Eyes: No icterus, extraocular muscles intact  Mouth: Oral mucosa is moist, no lesions on palate,  Neck: Supple, no deformities, masses, or tenderness Lungs: Normal respiratory effort, bilateral clear to auscultation, no crackles or wheezes.  Heart: Regular rate and rhythm, S1 and S2 normal, no murmurs, rubs auscultated Abdomen: BS  normoactive,soft,nondistended,non-tender to palpation,no organomegaly Extremities: No pretibial edema, no erythema, no cyanosis, no clubbing Neuro : Alert and oriented to time, place and person, No focal deficits Skin: No rashes seen on exam     Data Reviewed: I have personally reviewed following labs and imaging studies  CBG:  Recent Labs Lab 04/08/17 1810  GLUCAP 144*    CBC:  Recent Labs Lab 04/08/17 0337 04/09/17 0433 04/12/17 0435  WBC 7.1 6.9 5.9  NEUTROABS 5.6  --   --   HGB 12.8* 13.0 12.5*  HCT 36.7* 38.3* 36.2*  MCV 93.4 93.4 91.9  PLT 72* 71* 76*    Basic Metabolic Panel:  Recent Labs Lab 04/08/17 0337 04/09/17 0433 04/10/17 0900 04/11/17 0946  NA 129* 131* 131* 131*  K 4.2 3.4* 3.3* 3.3*  CL 91* 94* 92* 93*  CO2 25 23 25 28   GLUCOSE 150* 127* 131* 156*  BUN 133* 113* 99* 92*  CREATININE 4.33* 3.95* 3.48* 3.03*  CALCIUM 9.3 8.9 8.7* 8.6*  PHOS  --   --  4.5 4.4    No results found for this or any previous visit (from the past 240 hour(s)).   Liver Function Tests:  Recent Labs Lab 04/08/17 0337 04/10/17 0900 04/11/17 0946  AST 20  --   --   ALT 9*  --   --   ALKPHOS 72  --   --   BILITOT 1.6*  --   --   PROT 6.0*  --   --   ALBUMIN 3.3* 2.7* 2.5*   No results for input(s): LIPASE, AMYLASE in the last 168 hours.  Recent Labs Lab 04/08/17 0331 04/09/17 1814 04/10/17 0444 04/11/17 0408 04/12/17 0435  AMMONIA 79* 84* 66* 47* 97*       Studies: No results found.  Scheduled Meds: . allopurinol  100 mg Oral Q breakfast  . diphenhydrAMINE  50 mg Oral Once  . gentamicin cream  1 application Topical Daily  . lactulose  20 g Oral TID  . loratadine  10 mg Oral Daily  . metoprolol  50 mg Oral BID  . mupirocin cream   Topical BID  . sodium chloride flush  3 mL Intravenous Q12H  . terazosin  5 mg Oral BH-q7a  . torsemide  40 mg Oral Daily  . Warfarin - Pharmacist Dosing Inpatient   Does not apply q1800      Time spent:  25 min  [facility] Pager [phone]. If 7PM-7AM, please contact night-coverage at [email or web address], Office  [phone]  password [email or web address] 04/12/2017, 2:05 PM  LOS: 4 days

## 2017-04-12 NOTE — Care Management Note (Signed)
Case Management Note  Patient Details  Name: [name] TURGEON, [name] MRN: [id number] Date of Birth: August 15, 1941  Subjective/Objective:    CM following for progression and d/c planning.                 Action/Plan: 04/12/2017 Noted plan now is for CIR, however no beds available over the weekend, and unknown if a bed would be available on Monday. This was discussed with pt wife, Mrs Aro who is aware of this and states that she would appeal d/c if the pt were d/c at this point or before he is strong enough to go to PD training and for her to manage at home as " he is unable to stand" per Mrs Mochizuki.  CM continuing to follow, and attempted to arrange delivery of hospital bed however , Mrs Lengacher does not want delivery until he is ready to d/c. This CM attempted to explain that when he is ready to d/c we will be unable to await delivery if he is ready to go . Ms Rueb is aware of this however does not wish for bed to be delivered early? Hopefully a CIR bed will be available Monday and PD can be scheduled around PT work.  Expected Discharge Date:                  Expected Discharge Plan:     In-House Referral:  NA  Discharge planning Services  CM Consult  Post Acute Care Choice:  Durable Medical Equipment Choice offered to:  Spouse  DME Arranged:  Hospital bed DME Agency:  [place]:    [facility] Agency:     Status of Service:  Completed, signed off  If discussed at [place] of Stay Meetings, dates discussed:    Additional Comments:  Adron Bene, RN 04/12/2017, 3:47 PM

## 2017-04-12 NOTE — Consult Note (Signed)
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 right handed male with history of hepatopathy, end-stage renal disease with peritoneal hemodialysis, diastolic congestive heart failure, CAD with CABG, atrial fibrillation on xarelto, aortic stenosis with AVR 2012. Per chart review patient lives with spouse independent with assistive device prior to admission but has since been essentially bedbound. Patient with recent umbilical hernia repair 74/14/2395 as well as placement of peritoneal dialysis catheter. Presented 04/08/2017 with altered mental status, scrotal edema and abdominal discomfort. No chest pain. Sodium 129, BUN 133, creatinine 4.3, ammonia 79. Started on lactulose. Noted recent paracentesis 03/06/2017 for hepatopathy with 2 L yield Hemodialysis ongoing. Follow-up Gen. surgery for scrotal edema no active bleeding with conservative care.WOC follow-up for left dorsal hand wound skin care as directed. Lactulose adjusted latest ammonia level of 97. Patient's Xarelto was changed to Coumadin in light of his dialysis.   Review of Systems  Constitutional: Negative for chills and fever.  Eyes: Negative for blurred vision and double vision.  Respiratory: Positive for shortness of breath.   Cardiovascular: Positive for palpitations and leg swelling.  Gastrointestinal: Positive for constipation. Negative for nausea and vomiting.  Genitourinary: Negative for flank pain.  Musculoskeletal: Positive for joint pain and myalgias.  Neurological: Positive for weakness. Negative for seizures.  All other systems reviewed and are negative.  Past Medical History:  Diagnosis Date  . A-fib ([place])    permanent with tachy-brady syndrome  . Anemia   . Anxiety   . Atrial flutter ([facility])    hx of  . Biceps tendon tear    right  . Bone marrow disease   . Cancer ([place])    basal  cell  . CHF (congestive heart failure) ([place])   . Chronic kidney disease (CKD), stage III (moderate)    lov note dr Koleen Nimrod nephrology 09-17-13 on chart  . Complication of anesthesia 11-16-13   required alot of versed per anesthesia with cataract surgery  . Coronary heart disease    stent, CABG, RBBB  . DDD (degenerative disc disease)   . Diabetes ([place])   . Diabetes mellitus   . Fungal toenail infection   . Gout   . Hemorrhagic cystitis 2012  . Hepatitis    mono  . HTN (hypertension)   . Hypercalcemia 2011   due to sarcoidiosis  . Myocardial infarction [facility])    1995, 2002, 2006  . OSA (obstructive sleep apnea)    use bipap setting of 10 and 12  . Osteoarthritis   . Pneumonia 1966, 2011   hx of  . Presence of permanent cardiac pacemaker   . Right sciatic nerve pain    for block thursday 01-21-2014, injections  . Sarcoid 2011   pulmonary and bone marrow  . Shortness of breath dyspnea   . Synovial cyst of lumbar spine    l3-l4, l4-l5, injections  . Valvular heart disease    aortic stenosis/regurgitation   Past Surgical History:  Procedure Laterality Date  . BASAL CELL CARCINOMA EXCISION Right 2015   ear  . CARDIAC CATHETERIZATION N/A 12/10/2016   Procedure: Right Heart Cath;  Surgeon: Jolaine Artist, MD;  Location: [place] CV LAB;  Service: Cardiovascular;  Laterality: N/A;  . CARDIOVERSION N/A 12/30/2013   Procedure: CARDIOVERSION;  Surgeon: [name], MD;  Location: [facility] ENDOSCOPY;  Service: Cardiovascular;  Laterality: N/A;  10:49 cardioversion at  120 joules, then 150 joules, to SB  used  Lido 53m,  Propofol 160 mcg  . CARDIOVERSION  2003, 2006, 2012, 2013  . cataract surgery Right 2007  . cataract surgery Left 11-16-13  . COLONOSCOPY N/A 01/29/2014   Procedure: COLONOSCOPY;  Surgeon: JWinfield Cunas, MD;  [facility];  Service: Endoscopy;  Laterality: N/A;  amanda//ja  . CORONARY ARTERY BYPASS GRAFT  2006   x 5  . EP IMPLANTABLE DEVICE N/A  05/05/2015   Procedure: Pacemaker Implant;  Surgeon: JThompson Grayer MD;  [facility];  Service: Cardiovascular;  Laterality: N/A;  . FLEXIBLE SIGMOIDOSCOPY N/A 12/07/2015   Procedure: FLEXIBLE SIGMOIDOSCOPY;  Surgeon: VWilford Corner MD;  [facility];  Service: Endoscopy;  Laterality: N/A;  Unprepped  . INSERT / REPLACE / REMOVE PACEMAKER  05/05/2015  . PORTACATH PLACEMENT    . portacath removed    . Redo Median sternotomy, extracorporeal cirulation, AVR, Tricuspid valve repair  06/13/2011   AVR(23-mm Edwards pericardial Magna-Ease valve./ TVrepair (34-mm Edwards MC3 annuloplasty ring  . TOTAL KNEE ARTHROPLASTY Right 05/24/2014   Procedure: RIGHT TOTAL KNEE ARTHROPLASTY;  Surgeon: FGearlean Alf MD;  [facility];  Service: Orthopedics;  Laterality: Right;  . TRANSURETHRAL RESECTION OF PROSTATE  oct. 2012   TURP  . VASECTOMY  1977   Family History  Problem Relation Age of Onset  . Heart attack Father   . Stroke Father   . Stroke Mother   . Allergies    . Asthma    . Heart disease    . Cancer     Social History:  reports that he quit smoking about 21 years ago. His smoking use included Pipe and Cigars. He quit after 28.00 years of use. He has never used smokeless tobacco. He reports that he does not drink alcohol or use drugs. Allergies:  Allergies  Allergen Reactions  . Actos [Pioglitazone Hydrochloride] Swelling and Other (See Comments)    Severe peripheral edema  . Codeine Other (See Comments)    Makes Pt aggressive  . Depakote [Divalproex Sodium] Diarrhea    severe  . Diltiazem Other (See Comments)    lethargy and dyspnea  . Hydralazine Other (See Comments)    Severe chills and SOB   . Isosorbide Dinitrate Other (See Comments)    Severe chills and SOB   . Januvia [Sitagliptin] Diarrhea and Other (See Comments)    bradycardia  . Loop Diuretics Other (See Comments)    Spike in BUN and creatine levels  . Losartan Other (See Comments)     aphasia  . Nsaids Other (See Comments)    Renal problems  . Onglyza [Saxagliptin] Diarrhea and Other (See Comments)    bradycardia  . Orudis [Ketoprofen] Other (See Comments)    Cannot take due to renal insufficiency  . Penicillins Swelling and Other (See Comments)    Serum sickness Has patient had a PCN reaction causing immediate rash, facial/tongue/throat swelling, SOB or lightheadedness with hypotension: Yes Has patient had a PCN reaction causing severe rash involving mucus membranes or skin necrosis: No Has patient had a PCN reaction that required hospitalization Yes Has patient had a PCN reaction occurring within the last 10 years: No If all of the above answers are "NO", then may proceed with Cephalosporin use.   .Marland KitchenPotassium Iodide Itching and Rash    Severe entire body itching and rash  . Rozerem [Ramelteon] Diarrhea    Severe   . Amiodarone Other (  See Comments)    diarrhea  . Latex Swelling  . Verapamil Other (See Comments)    Severe fatigue  . Benazepril Hcl Other (See Comments)    ? Possible lowers platelets  . Indomethacin Other (See Comments)    Renal problems   . Minocycline Other (See Comments)    Makes dizzy and felt just lousy.  [name] Pentazocine Lactate Other (See Comments)    Unknown allergic reaction - pt and wife do not recall this  . Poison Ivy Extract [Poison Ivy Extract] Rash and Other (See Comments)    blisters  . Sertraline Hcl Other (See Comments)    Pt and wife do not recall this  . Shellfish Allergy Rash   Medications Prior to Admission  Medication Sig Dispense Refill  . acetaminophen (TYLENOL) 500 MG tablet Take 1,000 mg by mouth 2 (two) times daily as needed (pain).     [name] allopurinol (ZYLOPRIM) 100 MG tablet Take 200 mg by mouth daily with breakfast.     . cetirizine (ZYRTEC) 10 MG tablet Take 10 mg by mouth daily as needed for allergies.     . diazepam (VALIUM) 10 MG tablet Take 10 mg by mouth daily as needed (ankle and leg cramps).   0  .  dicyclomine (BENTYL) 10 MG capsule Take 10 mg by mouth every morning.     . diphenoxylate-atropine (LOMOTIL) 2.5-0.025 MG per tablet Take 2 tablets by mouth 2 (two) times daily as needed for diarrhea or loose stools. stomach  1  . insulin glargine (LANTUS) 100 unit/mL SOPN Inject 2-4 Units into the skin daily as needed (CBG >120). CBG 120-140 2 units, 141-160 3 units, 161-180 4 units, 181-200 5 units, 201-220 6 units    . lactulose (CHRONULAC) 10 GM/15ML solution Take 20 g by mouth 3 (three) times daily as needed for constipation.    . metolazone (ZAROXOLYN) 2.5 MG tablet TAKE ONE TABLET BY MOUTH ONCE WEEKLY ON FRIDAYS  AS NEEDED WITH 20MEQ POTASSIUM 5 tablet 2  . metoprolol (LOPRESSOR) 50 MG tablet Take 1 tablet (50 mg total) by mouth 2 (two) times daily. 60 tablet 11  . potassium chloride SA (K-DUR,KLOR-CON) 20 MEQ tablet Take 20 mEq by mouth every Saturday.    . Rivaroxaban (XARELTO) 15 MG TABS tablet Take 1 tablet (15 mg total) by mouth daily with supper. 30 tablet 5  . sucroferric oxyhydroxide (VELPHORO) 500 MG chewable tablet Chew 500 mg by mouth 3 (three) times daily as needed (high phosphorus).     . terazosin (HYTRIN) 5 MG capsule Take 1 capsule (5 mg total) by mouth every morning. 30 capsule 6  . Testosterone Cypionate 200 MG/ML KIT Inject 1 mL into the muscle every 14 (fourteen) days.    [name] torsemide (DEMADEX) 20 MG tablet Take 40 mg by mouth every morning.     . traMADol (ULTRAM) 50 MG tablet Take 50 mg by mouth every 6 (six) hours as needed for pain.    [name] zolpidem (AMBIEN) 10 MG tablet Take 10 mg by mouth at bedtime as needed for sleep.       Home: Home Living Family/patient expects to be discharged to:: Private residence Living Arrangements: Spouse/significant other Available Help at Discharge: Family, Available 24 hours/day, Friend(s) Type of Home: House Home Access: Stairs to enter [employer] of Steps: 2 Entrance Stairs-Rails: Left Home Layout: Two level, Able to  live on main level with bedroom/bathroom Bathroom Shower/Tub: [place]: Environmental consultant - 2 wheels, Grab bars -  tub/shower, Grab bars - toilet, Toilet riser, Bedside commode, Wheelchair - manual, Other (comment) (hoyer lift) Additional Comments: attempting to get hospital bed at home.   Functional History: Prior Function Level of Independence: Independent with assistive device(s) Comments: was able to perform short distance gait with RW prior to admission. Became bedbound just prior to hospitalization Functional Status:  Mobility: Bed Mobility Overal bed mobility: Needs Assistance Bed Mobility: Sit to Supine Sit to supine: Max assist, +2 for physical assistance General bed mobility comments: Max A for trunk and Le's into bed. Min A to move up in bed with use of UE's on railing Transfers Overall transfer level: Needs assistance Equipment used: Ambulation equipment used Transfer via Lift Equipment: [place] Transfers: Sit to/from Stand Sit to Stand: +2 physical assistance, +2 safety/equipment, Max assist General transfer comment: Max A to stand from recliner and pull up onto STEDY. pt is able to stand on steady for hygiene before sitting in bed.  Ambulation/Gait General Gait Details: unable to perform this session    ADL:    Cognition: Cognition Overall Cognitive Status: Within Functional Limits for tasks assessed Orientation Level: Oriented to person, Oriented to place, Oriented to time, Oriented to situation Cognition Arousal/Alertness: Awake/alert Behavior During Therapy: WFL for tasks assessed/performed Overall Cognitive Status: Within Functional Limits for tasks assessed  Blood pressure (!) 144/57, pulse (!) 53, temperature 98.2 F (36.8 C), temperature source Oral, resp. rate 17, height _0  (1.803 m), weight 102 kg (224 lb 13.9 oz), SpO2 96 %. Physical Exam  HENT:  Head: Normocephalic.  Eyes: EOM are normal.  Neck: Normal range of motion. Neck supple. No  thyromegaly present.  Cardiovascular:  Cardiac rate controlled  Respiratory: Effort normal and breath sounds normal.  GI: Soft. Bowel sounds are normal. He exhibits distension.  Neurological:  Lethargic but arousable. Provides Name and age with some delay in processing.STM deficits. Follows simple commands. UE strength grossly 3- to 3/5 prox to 4/5 distally. RUE limited due to biceps tendo injury. LE: 3-/5 HF, 3/5 KE and 4/5 ADF/PF  Skin:  Dry dressing to left hand    Results for orders placed or performed during the hospital encounter of 04/08/17 (from the past 24 hour(s))  Protime-INR     Status: Abnormal   Collection Time: 04/12/17  4:35 AM  Result Value Ref Range   Prothrombin Time 48.0 (H) 11.4 - 15.2 seconds   INR 5.02 (HH)   Ammonia     Status: Abnormal   Collection Time: 04/12/17  4:35 AM  Result Value Ref Range   Ammonia 97 (H) 9 - 35 umol/L  CBC     Status: Abnormal   Collection Time: 04/12/17  4:35 AM  Result Value Ref Range   WBC 5.9 4.0 - 10.5 K/uL   RBC 3.94 (L) 4.22 - 5.81 MIL/uL   Hemoglobin 12.5 (L) 13.0 - 17.0 g/dL   HCT 36.2 (L) 39.0 - 52.0 %   MCV 91.9 78.0 - 100.0 fL   MCH 31.7 26.0 - 34.0 pg   MCHC 34.5 30.0 - 36.0 g/dL   RDW 17.0 (H) 11.5 - 15.5 %   Platelets 76 (L) 150 - 400 K/uL   No results found.  Assessment/Plan: Diagnosis: Weakness and cognitive deficits secondary to hepatic encephalopathy and multiple medical issues above 1. Does the need for close, 24 hr/day medical supervision in concert with the patient's rehab needs make it unreasonable for this patient to be served in a less intensive setting? Yes 2. Co-Morbidities requiring  supervision/potential complications: CHF,Aflutter, CAD, DM, CKDIII, gout 3. Due to bladder management, bowel management, safety, skin/wound care, disease management, medication administration, pain management and patient education, does the patient require 24 hr/day rehab nursing? Yes 4. Does the patient require  coordinated care of a physician, rehab nurse, PT (1-2 hrs/day, 5 days/week), OT (1-2 hrs/day, 5 days/week) and SLP (1-2 hrs/day, 5 days/week) to address physical and functional deficits in the context of the above medical diagnosis(es)? Yes Addressing deficits in the following areas: balance, endurance, locomotion, strength, transferring, bowel/bladder control, bathing, dressing, feeding, grooming, toileting, cognition and psychosocial support 5. Can the patient actively participate in an intensive therapy program of at least 3 hrs of therapy per day at least 5 days per week? Yes 6. The potential for patient to make measurable gains while on inpatient rehab is excellent 7. Anticipated functional outcomes upon discharge from inpatient rehab are supervision and min assist  with PT, supervision and min assist with OT, modified independent and supervision with SLP. 8. Estimated rehab length of stay to reach the above functional goals is: 14-18 days 9. Does the patient have adequate social supports and living environment to accommodate these discharge functional goals? Yes 10. Anticipated D/C setting: Home 11. Anticipated post D/C treatments: [place] therapy 12. Overall Rehab/Functional Prognosis: excellent  RECOMMENDATIONS: This patient's condition is appropriate for continued rehabilitative care in the following setting: CIR Patient has agreed to participate in recommended program. Yes Note that insurance prior authorization may be required for reimbursement for recommended care.  Comment: Rehab Admissions Coordinator to follow up.  Thanks,  Meredith Staggers, MD, Mellody Drown    Cathlyn Parsons., PA-C 04/12/2017

## 2017-04-12 NOTE — Progress Notes (Signed)
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  His uremia is largely cleared.  He will be able to start PD training on 4/24 at [facility].  He's been in the bed almost all week so we need a PT consult today pending discharge.  We'll not put him on the cycler today so he can get his PT consult.  I think he can be discharged today if PT eval is favorable.  Only complicating factor is INR 3 Atrial fibrillation: on metoprolol.  Don't recommend Xarelto in ESRD; has switched to warfarin--> caution with liver dz; INR 5.02 today 4.  Elevated ammonia- mild HE; lactulose TID, improved 5.  Combined CHF: volume overload improving 6.  Hyponatremia: improving 7.  Dispo: have discussed with pt and wife, since uremia is cleared he could potentially from a renal perspective go home today, pt's wife initially thought Saturday would be better so will see about PT eval and Coumadin plan.  Would like a hospital bed (she will d/w CM)  Subjective:    I reviewed drain times from last night.  43 min to 2.5 hrs.  Too long.  There is no inflow/ outflow issue with the catheter but him needing to be supine results in long drain times.    Regardless, he actually UF'd 873 mL.  His uremia is largely cleared.    Objective:   BP 131/63 (BP Location: Right Arm)   Pulse (!) 55   Temp 98 F (36.7 C) (Oral)   Resp 17   Ht 5\' 11"  (1.803 m)   Wt 102 kg (224 lb 13.9 oz)   SpO2 98%   BMI 31.36 kg/m   Physical Exam: GEN older gentleman, sitting in chair, NAD HEENT EOMI, PERRL, MMM NECK no JVD  PULM clear bilaterally CV: irregular, III/VI systolic murmur at L and R upper sternal border (but heard throughout precordium) ABD obese, laparoscopic port incisions dressed, c/d/I, PD cath  dressed as well.  Some ecchymoses but no frank hematoma EXT no LE edema NEURO: no asterixis or confusion   Labs: BMET  Recent Labs Lab 04/08/17 0337 04/09/17 0433 04/10/17 0900 04/11/17 0946  NA 129* 131* 131* 131*  K 4.2 3.4* 3.3* 3.3*  CL 91* 94* 92* 93*  CO2 25 23 25 28   GLUCOSE 150* 127* 131* 156*  BUN 133* 113* 99* 92*  CREATININE 4.33* 3.95* 3.48* 3.03*  CALCIUM 9.3 8.9 8.7* 8.6*  PHOS  --   --  4.5 4.4   CBC  Recent Labs Lab 04/08/17 0337 04/09/17 0433 04/12/17 0435  WBC 7.1 6.9 5.9  NEUTROABS 5.6  --   --   HGB 12.8* 13.0 12.5*  HCT 36.7* 38.3* 36.2*  MCV 93.4 93.4 91.9  PLT 72* 71* 76*    @IMGRELPRIORS @ Medications:    . allopurinol  100 mg Oral Q breakfast  . diphenhydrAMINE  50 mg Oral Once  . gentamicin cream  1 application Topical Daily  . lactulose  20 g Oral TID  . loratadine  10 mg Oral Daily  . metoprolol  50 mg Oral BID  . mupirocin cream   Topical BID  . sodium chloride flush  3 mL Intravenous Q12H  . terazosin  5 mg  Oral [id number]  . torsemide  40 mg Oral Daily  . Warfarin - Pharmacist Dosing Inpatient   Does not apply q1800     Madelon Lips MD 04/12/2017, 8:34 AM

## 2017-04-12 NOTE — Progress Notes (Signed)
ANTICOAGULATION CONSULT NOTE - Follow Up Consult  Pharmacy Consult for warfarin Indication: atrial fibrillation  Patient Measurements: Height: 5\' 11"  (180.3 cm) Weight: 224 lb 13.9 oz (102 kg) IBW/kg (Calculated) : 75.3  Vital Signs: Temp: 98.2 F (36.8 C) (04/20 0908) Temp Source: Oral (04/20 0908) BP: 122/67 (04/20 0908) Pulse Rate: 53 (04/20 0908)  Labs:  Recent Labs  04/10/17 0444 04/10/17 0900 04/11/17 0408 04/11/17 0946 04/12/17 0435  HGB  --   --   --   --  12.5*  HCT  --   --   --   --  36.2*  PLT  --   --   --   --  76*  LABPROT 23.0*  --  39.2*  --  48.0*  INR 2.00  --  3.90  --  5.02*  CREATININE  --  3.48*  --  3.03*  --     Assessment: 56 YOM with CKD V/new ESRD with peritoneal dialysis initialed on 04/09/17. The patient was on Xarelto 15 mg once daily PTA for anticoagulation however with new ESRD/PD patient was transitioned to warfarin. Last dose of Xarelto was 4/16 in the evening.  INR on admission (4/16) was 2 due to influence of Xarelto. INR stayed stable ~2 until yesterday when it jumped to 3.9. INR increased further today to 5.02.  While Xarelto affects INR (though degree is unable to be quantified) and patient has poor renal clearance, jumps in INR likely attributable to warfarin. History of liver disease is likely also playing into INR fluctuation.  Hgb 12.5, plts chronically low (currently 76). No bleeding noted.  Goal of Therapy:  INR 2-3 Monitor platelets by anticoagulation protocol: Yes   Plan:  -Recommend holding warfarin until Monday 4/3 and then starting 2.5mg  po daily with an INR check on Wednesday 4/25 -noted patient is established with Dr. Haroldine Laws- recommend appointment at [facility] -Daily INR while inpatient -Follow for s/s bleeding   Jacqueline Delapena D. Rosemary Mossbarger, PharmD, BCPS Clinical Pharmacist Pager: [phone] 04/12/2017 10:31 AM

## 2017-04-13 LAB — BASIC METABOLIC PANEL
ANION GAP: 11 (ref 5–15)
BUN: 82 mg/dL — ABNORMAL HIGH (ref 6–20)
CHLORIDE: 94 mmol/L — AB (ref 101–111)
CO2: 28 mmol/L (ref 22–32)
Calcium: 8.5 mg/dL — ABNORMAL LOW (ref 8.9–10.3)
Creatinine, Ser: 2.63 mg/dL — ABNORMAL HIGH (ref 0.61–1.24)
GFR calc Af Amer: 25 mL/min — ABNORMAL LOW (ref >60)
GFR calc non Af Amer: 22 mL/min — ABNORMAL LOW (ref >60)
GLUCOSE: 190 mg/dL — AB (ref 65–99)
POTASSIUM: 2.8 mmol/L — AB (ref 3.5–5.1)
Sodium: 133 mmol/L — ABNORMAL LOW (ref 135–145)

## 2017-04-13 LAB — PROTIME-INR
INR: 4.11
PROTHROMBIN TIME: 41.3 s — AB (ref 11.4–15.2)

## 2017-04-13 MED ORDER — POTASSIUM CHLORIDE CRYS ER 20 MEQ PO TBCR
20.0000 meq | EXTENDED_RELEASE_TABLET | Freq: Two times a day (BID) | ORAL | Status: DC
  Administered 2017-04-13 (×2): 20 meq via ORAL
  Filled 2017-04-13 (×2): qty 1

## 2017-04-13 NOTE — Progress Notes (Signed)
Called Hemodialysis Unit's CN-made aware that this patient has CCPD order for today.

## 2017-04-13 NOTE — Progress Notes (Signed)
ANTICOAGULATION CONSULT NOTE - Follow Up Consult  Pharmacy Consult for warfarin Indication: atrial fibrillation  Patient Measurements: Height: 5\' 11"  (180.3 cm) Weight: 222 lb 10.6 oz (101 kg) IBW/kg (Calculated) : 75.3  Vital Signs: Temp: 98.4 F (36.9 C) (04/21 0900) Temp Source: Oral (04/21 0900) BP: 128/68 (04/21 0900) Pulse Rate: 56 (04/21 0900)  Labs:  Recent Labs  04/11/17 0408 04/11/17 0946 04/12/17 0435 04/13/17 0310  HGB  --   --  12.5*  --   HCT  --   --  36.2*  --   PLT  --   --  76*  --   LABPROT 39.2*  --  48.0* 41.3*  INR 3.90  --  5.02* 4.11*  CREATININE  --  3.03*  --  2.63*    Assessment: 19 YOM with CKD V/new ESRD with peritoneal dialysis initialed on 04/09/17. The patient was on Xarelto 15 mg once daily PTA for anticoagulation however with new ESRD/PD patient was transitioned to warfarin. Last dose of Xarelto was 4/16 in the evening.  INR on admission (4/16) was 2 due to influence of Xarelto. INR stayed stable ~2 until 4/19 when it jumped to 3.9. INR increased further yesterday to 5.02 so warfarin was held.   INR today has decreased largely to 4.11. Will continue to hold and will re-start dosing more conservatively when INR closer to normal range.  Hgb 12.5, plts chronically low (currently 76). No bleeding noted in chart.   Goal of Therapy:  INR 2-3 Monitor platelets by anticoagulation protocol: Yes   Plan:  -Hold warfarin again tonight  - It patient discharged would likely hold doses until Monday and then re-start warfarin at 2.5 mg daily. INR check early in the week. (Wednesday) -noted patient is established with Dr. Haroldine Laws- recommend appointment at [facility] -Daily INR while inpatient -Follow for s/s bleeding   Uvaldo Bristle, PharmD PGY1 Pharmacy Resident Pager: [phone] 04/13/2017 9:48 AM

## 2017-04-13 NOTE — Progress Notes (Signed)
  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  Continue supine CCPD at night- will increase to 5 exchanges 1L fill vol 2.5% dianeal 70% tidal leaving belly dry 3 Atrial fibrillation: on metoprolol.  Don't recommend Xarelto in ESRD; has switched to warfarin 4.  Elevated ammonia- mild HE; lactulose TID, improved 5.  Combined CHF: volume overload improving 6.  Hyponatremia: improving 7.  Hypokalemia- supplementing  8.  Dispo: going to CIR when bed available  Subjective:    Pt qualifies for inpatient rehab- no beds over weekend.  I was able to speak with admissions coordinator to fill her in on his new start PD.  We have transitioned his PD to nighttime so he is able to complete his therapies.  He still reports some drain pain even with 70% tidal.  Objective:   BP 128/68 (BP Location: Right Arm)   Pulse (!) 56   Temp 98.4 F (36.9 C) (Oral)   Resp 18   Ht 5\' 11"  (1.803 m)   Wt 101 kg (222 lb 10.6 oz)   SpO2 98%   BMI 31.06 kg/m   Physical Exam: GEN older gentleman, lying in bed, smiling and joking this AM HEENT EOMI, PERRL, dry MM NECK no JVD  PULM clear bilaterally CV: irregular, III/VI systolic murmur at L and R upper sternal border (but heard throughout precordium) ABD obese, laparoscopic port incisions dressed, c/d/I, PD cath dressed as well.  Some ecchymoses but no frank hematoma EXT no LE edema NEURO: no asterixis or confusion   Labs: BMET  Recent Labs Lab 04/08/17 0337 04/09/17 0433 04/10/17 0900 04/11/17 0946 04/13/17 0310  NA 129* 131* 131* 131* 133*  K 4.2 3.4* 3.3* 3.3* 2.8*  CL 91* 94* 92* 93* 94*  CO2 25 23 25 28 28   GLUCOSE 150* 127* 131* 156* 190*  BUN 133* 113* 99* 92* 82*  CREATININE 4.33* 3.95* 3.48* 3.03* 2.63*  CALCIUM 9.3 8.9 8.7* 8.6* 8.5*  PHOS  --   --  4.5 4.4  --    CBC  Recent  Labs Lab 04/08/17 0337 04/09/17 0433 04/12/17 0435  WBC 7.1 6.9 5.9  NEUTROABS 5.6  --   --   HGB 12.8* 13.0 12.5*  HCT 36.7* 38.3* 36.2*  MCV 93.4 93.4 91.9  PLT 72* 71* 76*    @IMGRELPRIORS @ Medications:    . allopurinol  100 mg Oral Q breakfast  . diphenhydrAMINE  50 mg Oral Once  . gentamicin cream  1 application Topical Daily  . lactulose  20 g Oral TID  . loratadine  10 mg Oral Daily  . metoprolol  50 mg Oral BID  . mupirocin cream   Topical BID  . potassium chloride  20 mEq Oral BID  . sodium chloride flush  3 mL Intravenous Q12H  . terazosin  5 mg Oral BH-q7a  . torsemide  40 mg Oral Daily  . Warfarin - Pharmacist Dosing Inpatient   Does not apply q1800     Madelon Lips MD 04/13/2017, 9:55 AM

## 2017-04-13 NOTE — Progress Notes (Signed)
Physical Therapy Treatment Patient Details Name: [name] ENKE, MD MRN: [id number] DOB: January 29, 1941 Today's Date: 04/13/2017    History of Present Illness Pt is a 76 yo male admitted through ED on 04/08/17 with AMS resulting from hepatic encephalopathy and ascites. Pt was started on peritoneal dialysis and is planning on continuing this when he returns home. PMH significant for ischemic heart disease, CABG, DM, HTN, A-Fib, cardiac pacemaker, CHF.     PT Comments    Pt making slow progress with mobility and continues to be limited secondary to fatigue and abdominal pain. Pt continues to require physical assistance of two for transfers. Pt would continue to benefit from skilled physical therapy services at this time while admitted and after d/c to address the below listed limitations in order to improve overall safety and independence with functional mobility.    Follow Up Recommendations  CIR;Supervision/Assistance - 24 hour     Equipment Recommendations  None recommended by PT;Other (comment) (defer to next venue)    Recommendations for Other Services Rehab consult;OT consult     Precautions / Restrictions Precautions Precautions: Fall Restrictions Weight Bearing Restrictions: No    Mobility  Bed Mobility               General bed mobility comments: pt sitting OOB in recliner chair when therapist entered room  Transfers Overall transfer level: Needs assistance Equipment used: Rolling walker (2 wheeled) Transfers: Sit to/from Stand Sit to Stand: Max assist;+2 physical assistance         General transfer comment: increased time and effort, VC'ing for bilateral hand placement and technique, max A x2 to rise from chair. Pt also requiring verbal and tactile cues to achieve an erect standing posture.  Ambulation/Gait             General Gait Details: pt unable at this time   Stairs            Wheelchair Mobility    Modified Rankin (Stroke Patients  Only)       Balance Overall balance assessment: Needs assistance Sitting-balance support: Bilateral upper extremity supported;Feet supported Sitting balance-Leahy Scale: Poor   Postural control: Posterior lean Standing balance support: Bilateral upper extremity supported;During functional activity Standing balance-Leahy Scale: Poor Standing balance comment: pt required bilateral UE supports on RW and mod-max A x2 to stand for approximately 3 minutes                            Cognition Arousal/Alertness: Awake/alert Behavior During Therapy: WFL for tasks assessed/performed Overall Cognitive Status: Within Functional Limits for tasks assessed                                        Exercises General Exercises - Lower Extremity Ankle Circles/Pumps: AROM;Both;20 reps;Seated Long Arc Quad: AROM;Strengthening;Both;20 reps;Seated Hip Flexion/Marching: AROM;Strengthening;Both;10 reps;Seated    General Comments        Pertinent Vitals/Pain Pain Assessment: 0-10 Pain Score: 6  Pain Location: abdomen Pain Descriptors / Indicators: Grimacing;Guarding;Sore Pain Intervention(s): Monitored during session;Repositioned    Home Living                      Prior Function            PT Goals (current goals can now be found in the care plan section) Acute Rehab PT Goals  PT Goal Formulation: With patient/family Time For Goal Achievement: 04/19/17 Potential to Achieve Goals: Good Progress towards PT goals: Progressing toward goals    Frequency    Min 3X/week      PT Plan Current plan remains appropriate    Co-evaluation             End of Session Equipment Utilized During Treatment: Gait belt Activity Tolerance: Patient limited by fatigue Patient left: in chair;with call bell/phone within reach;with family/visitor present Nurse Communication: Mobility status;Need for lift equipment PT Visit Diagnosis: Other abnormalities of gait  and mobility (R26.89);Muscle weakness (generalized) (M62.81);Pain Pain - part of body:  (abdomen)     Time: 6606-3016 PT Time Calculation (min) (ACUTE ONLY): 21 min  Charges:  $Therapeutic Activity: 8-22 mins                    G Codes:       [place], [place], [place] 04/13/2017, 11:06 AM

## 2017-04-13 NOTE — Progress Notes (Signed)
RT NOTE:  Pt wearing home CPAP when RT arrived. Pt comfortable.

## 2017-04-13 NOTE — Progress Notes (Signed)
[name] NOTE  [name] Fiddler, MD EXN:[id number] DOB: 01-14-1941 DOA: 04/08/2017 PCP: Leotis Pain, DO   Brief HPI:    76 y.o.malewith medical history significant of ESRD, CHF combine last EF at 45 % by ECHO 2017, , CAD S/P CABG 2006, A fib on Xarelto,aortic stenosis and tricuspid regurgitation status post bioprosthetic AVR 2012, Liver Failure who presents complaining of abdominal discomfort that started overnight. He underwent umbilical hernia repair and peritoneal catheter placement 4-13 by Dr Raul Del. Patient had abdominal binder in place. Patient also notice swelling of scrotum and penis. ED physician discussed with Dr Raul Del, it is ok to remove binder.  Patient was mildly confuse, notice to have swelling scrotum and penis. Sodium at 129, BUN 133, Cr 4.3, ammonia 79, Bili 1.6, Hb at 12, Platelet 72, INR 2.0    Subjective   Patient seen and examined, Feeling better this morning   Assessment/Plan:     1. Acute hepatic encephalopathy-Resolved, patient came with confusion ammonia was 79 on admission. Started on lactulose.  Yesterday ammonia was 47. Continue lactulose 20 g 3 times a day. Mental status is clear 2. ESRD- patient underwent peritoneal catheter placement. Started on peritoneal dialysis on 04/09/2017. patient is supposed to start PD training on 424 at [facility]. Patient to get PD while in the hospital 3. Scrotal swelling- improved , patient recently underwent laparoscopically assisted obliquity repair and peritoneal catheter placement at [facility]. He was seen by surgery for scrotal swelling, the swelling likely from edema and no active bleeding. Gen. surgery recommends patient to be followed up by surgeon who did the procedure. no changes.  4. Atrial fibrillation- continue metoprolol 50 mg by mouth twice a day, Xarelto was changed to warfarin as it was not recommended for ESRD patient.  continue warfarin per pharmacy consultation. INR is  4.11 5. Left hand wound- patient had left hand wound caused by dog scratch, started on Bactroban cream topically on left hand. Stable 6. Chronic combined systolic and diastolic CHF- appears compensated. Started on peritoneal dialysis on 04/09/2017 as above.  7. Congestive hepatopathy- patient gets paracentesis, last paracentesis was on 03/06/2017. Also had 2 L removed during recent surgery on 04/05/2017. Continue lactulose. When necessary paracentesis. Last ammonia level was 97. Will recheck ammonia level in a.m.   DVT prophylaxis: Warfarin  Code Status: Full code  Family Communication: No family present at bedside   Disposition Plan: patient and his wife are looking to go to inpatient rehabilitation. Will order CIR consult.    Consultants:  Nephrology  Procedures:  None   Continuous infusions . sodium chloride    . dialysis solution 2.5% low-MG/low-CA        Antibiotics:   Anti-infectives    Start     Dose/Rate Route Frequency Ordered Stop   04/08/17 1030  ciprofloxacin (CIPRO) tablet 250 mg     250 mg Oral Daily 04/08/17 0945 04/10/17 1026       Objective   Vitals:   04/12/17 2005 04/12/17 2011 04/13/17 0512 04/13/17 0900  BP:  (!) 152/71 (!) 129/59 128/68  Pulse:  60 (!) 55 (!) 56  Resp:  19 16 18   Temp:  97.9 F (36.6 C) 98.3 F (36.8 C) 98.4 F (36.9 C)  TempSrc:    Oral  SpO2:  95% 98% 98%  Weight: 101.2 kg (223 lb 1.7 oz) 101 kg (222 lb 10.6 oz)    Height:        Intake/Output Summary (Last  24 hours) at 04/13/17 1524 Last data filed at 04/13/17 1300  Gross per 24 hour  Intake             4688 ml  Output             4304 ml  Net              384 ml   Filed Weights   04/11/17 2000 04/12/17 2005 04/12/17 2011  Weight: 102 kg (224 lb 13.9 oz) 101.2 kg (223 lb 1.7 oz) 101 kg (222 lb 10.6 oz)     Physical Examination  Physical Exam: Eyes: No icterus, extraocular muscles intact  Mouth: Oral mucosa is moist, no lesions on palate,  Neck:  Supple, no deformities, masses, or tenderness Lungs: Normal respiratory effort, bilateral clear to auscultation, no crackles or wheezes.  Heart: Regular rate and rhythm, S1 and S2 normal, grade 3 with 6 systolic murmur auscultated in the pulmonic area Abdomen: BS normoactive,soft,nondistended,non-tender to palpation,no organomegaly Extremities: No pretibial edema, no erythema, no cyanosis, no clubbing Neuro : Alert and oriented to time, place and person, No focal deficits Skin: No rashes seen on exam     Data Reviewed: I have personally reviewed following labs and imaging studies  CBG:  Recent Labs Lab 04/08/17 1810  GLUCAP 144*    CBC:  Recent Labs Lab 04/08/17 0337 04/09/17 0433 04/12/17 0435  WBC 7.1 6.9 5.9  NEUTROABS 5.6  --   --   HGB 12.8* 13.0 12.5*  HCT 36.7* 38.3* 36.2*  MCV 93.4 93.4 91.9  PLT 72* 71* 76*    Basic Metabolic Panel:  Recent Labs Lab 04/08/17 0337 04/09/17 0433 04/10/17 0900 04/11/17 0946 04/13/17 0310  NA 129* 131* 131* 131* 133*  K 4.2 3.4* 3.3* 3.3* 2.8*  CL 91* 94* 92* 93* 94*  CO2 25 23 25 28 28   GLUCOSE 150* 127* 131* 156* 190*  BUN 133* 113* 99* 92* 82*  CREATININE 4.33* 3.95* 3.48* 3.03* 2.63*  CALCIUM 9.3 8.9 8.7* 8.6* 8.5*  PHOS  --   --  4.5 4.4  --     No results found for this or any previous visit (from the past 240 hour(s)).   Liver Function Tests:  Recent Labs Lab 04/08/17 0337 04/10/17 0900 04/11/17 0946  AST 20  --   --   ALT 9*  --   --   ALKPHOS 72  --   --   BILITOT 1.6*  --   --   PROT 6.0*  --   --   ALBUMIN 3.3* 2.7* 2.5*   No results for input(s): LIPASE, AMYLASE in the last 168 hours.  Recent Labs Lab 04/08/17 0331 04/09/17 1814 04/10/17 0444 04/11/17 0408 04/12/17 0435  AMMONIA 79* 84* 66* 47* 97*       Studies: No results found.  Scheduled Meds: . allopurinol  100 mg Oral Q breakfast  . diphenhydrAMINE  50 mg Oral Once  . gentamicin cream  1 application Topical Daily  .  lactulose  20 g Oral TID  . loratadine  10 mg Oral Daily  . metoprolol  50 mg Oral BID  . mupirocin cream   Topical BID  . potassium chloride  20 mEq Oral BID  . sodium chloride flush  3 mL Intravenous Q12H  . terazosin  5 mg Oral BH-q7a  . torsemide  40 mg Oral Daily  . Warfarin - Pharmacist Dosing Inpatient   Does not apply [phone]  Time spent: 25 min  [facility] Pager [phone]. If 7PM-7AM, please contact night-coverage at [email or web address], Office  [phone]  password [email or web address] 04/13/2017, 3:24 PM  LOS: 5 days

## 2017-04-14 LAB — PROTIME-INR
INR: 3.4
PROTHROMBIN TIME: 35.1 s — AB (ref 11.4–15.2)

## 2017-04-14 LAB — BASIC METABOLIC PANEL
ANION GAP: 9 (ref 5–15)
BUN: 73 mg/dL — ABNORMAL HIGH (ref 6–20)
CALCIUM: 8.3 mg/dL — AB (ref 8.9–10.3)
CO2: 29 mmol/L (ref 22–32)
Chloride: 94 mmol/L — ABNORMAL LOW (ref 101–111)
Creatinine, Ser: 2.53 mg/dL — ABNORMAL HIGH (ref 0.61–1.24)
GFR, EST AFRICAN AMERICAN: 27 mL/min — AB (ref >60)
GFR, EST NON AFRICAN AMERICAN: 23 mL/min — AB (ref >60)
Glucose, Bld: 132 mg/dL — ABNORMAL HIGH (ref 65–99)
Potassium: 2.9 mmol/L — ABNORMAL LOW (ref 3.5–5.1)
SODIUM: 132 mmol/L — AB (ref 135–145)

## 2017-04-14 LAB — AMMONIA: AMMONIA: 77 umol/L — AB (ref 9–35)

## 2017-04-14 MED ORDER — HYDROCORTISONE 1 % EX CREA
TOPICAL_CREAM | Freq: Two times a day (BID) | CUTANEOUS | Status: DC | PRN
  Filled 2017-04-14: qty 28

## 2017-04-14 MED ORDER — WARFARIN SODIUM 1 MG PO TABS
1.0000 mg | ORAL_TABLET | Freq: Once | ORAL | Status: AC
  Administered 2017-04-14: 1 mg via ORAL
  Filled 2017-04-14: qty 1

## 2017-04-14 MED ORDER — DELFLEX-LC/4.25% DEXTROSE 483 MOSM/L IP SOLN
INTRAPERITONEAL | Status: DC
  Administered 2017-04-14: 5000 mL via INTRAPERITONEAL

## 2017-04-14 MED ORDER — HYDROCORTISONE 1 % EX LOTN
TOPICAL_LOTION | Freq: Two times a day (BID) | CUTANEOUS | Status: DC | PRN
  Filled 2017-04-14: qty 118

## 2017-04-14 MED ORDER — DELFLEX-LC/2.5% DEXTROSE 394 MOSM/L IP SOLN
INTRAPERITONEAL | Status: DC
  Administered 2017-04-14: 5000 mL via INTRAPERITONEAL

## 2017-04-14 MED ORDER — POTASSIUM CHLORIDE CRYS ER 20 MEQ PO TBCR
40.0000 meq | EXTENDED_RELEASE_TABLET | Freq: Two times a day (BID) | ORAL | Status: DC
  Administered 2017-04-14 – 2017-04-16 (×5): 40 meq via ORAL
  Filled 2017-04-14 (×5): qty 2

## 2017-04-14 NOTE — Progress Notes (Signed)
ANTICOAGULATION CONSULT NOTE - Follow Up Consult  Pharmacy Consult for warfarin Indication: atrial fibrillation  Patient Measurements: Height: 5\' 11"  (180.3 cm) Weight: 233 lb 7.5 oz (105.9 kg) IBW/kg (Calculated) : 75.3  Vital Signs: Temp: 97.8 F (36.6 C) (04/22 0540) Temp Source: Axillary (04/22 0540) BP: 139/73 (04/22 0540) Pulse Rate: 51 (04/22 0540)  Labs:  Recent Labs  04/11/17 0946 04/12/17 0435 04/13/17 0310 04/14/17 0629  HGB  --  12.5*  --   --   HCT  --  36.2*  --   --   PLT  --  76*  --   --   LABPROT  --  48.0* 41.3* 35.1*  INR  --  5.02* 4.11* 3.40  CREATININE 3.03*  --  2.63* 2.53*    Assessment: 33 YOM with CKD V/new ESRD with peritoneal dialysis initialed on 04/09/17. The patient was on Xarelto 15 mg once daily PTA for anticoagulation however with new ESRD/PD patient was transitioned to warfarin. Last dose of Xarelto was 4/16 in the evening.  INR on admission (4/16) was 2 due to influence of Xarelto. INR stayed stable ~2 until 4/19 when it jumped to 3.9.  INR continues to trend down after holding doses X 3 nights. INR today 3.4 which is close to within range.   Hgb 12.5, plts chronically low (currently 76). No bleeding noted in chart.   Goal of Therapy:  INR 2-3 Monitor platelets by anticoagulation protocol: Yes   Plan:  - Warfarin 1 mg x 1 tonight  - If patient does not go to inpatient rehab and is discharged would give 2.5 mg daily starting Monday (4/23) and have an INR check on Wednesday ( 4/25) -Daily INR while inpatient -Follow for s/s bleeding   Uvaldo Bristle, PharmD PGY1 Pharmacy Resident Pager: [phone] 04/14/2017 8:29 AM

## 2017-04-14 NOTE — Progress Notes (Addendum)
  [facility] Progress Note   Assessment/ Plan:   1 abd and scrotal swelling: bothersome again today.  Sling in place.  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  Continue supine CCPD at night- continue 5 exchanges 1L fill vol, will mix in a 4.25% with 2.5 % today since his scrotum is more uncomfortable and he has some increased edema.  ? If weights are accurate (bed wts)--> so expect improvement with mix of dianeal and continuation of torsemide 3 Atrial fibrillation: on metoprolol.  Don't recommend Xarelto in ESRD; has switched to warfarin 4.  Elevated ammonia- mild HE; lactulose TID, improved 5.  Combined CHF: volume overload improving 6.  Hyponatremia: improving 7.  Hypokalemia- supplementing  8.  Dispo: going to CIR when bed available  Subjective:    Tolerated PD well overnight.  It appears that he UF'd around 400 mL (had UF'd 873 mL yesterday).  Scrotal edema is bothersome again  Objective:   BP 139/73   Pulse (!) 51   Temp 97.8 F (36.6 C) (Axillary)   Resp 16   Ht 5\' 11"  (1.803 m)   Wt 105.9 kg (233 lb 7.5 oz)   SpO2 100%   BMI 32.56 kg/m   Physical Exam: GEN older gentleman, lying in bed, NAD HEENT EOMI, PERRL, MMM NECK no JVD  PULM diminished at bases today CV: irregular, III/VI systolic murmur at L and R upper sternal border (but heard throughout precordium) ABD obese, laparoscopic port incisions healing well, PD cath dressing c/d/I.  Some ecchymoses but no frank hematoma. GU: + scrotal edema EXT 1+ LE edema NEURO: no asterixis or confusion   Labs: BMET  Recent Labs Lab 04/08/17 0337 04/09/17 0433 04/10/17 0900 04/11/17 0946 04/13/17 0310 04/14/17 0629  NA 129* 131* 131* 131* 133* 132*  K 4.2 3.4* 3.3* 3.3* 2.8* 2.9*  CL 91* 94* 92* 93* 94* 94*  CO2 25 23 25 28 28 29   GLUCOSE 150* 127* 131* 156* 190* 132*  BUN 133* 113* 99* 92* 82* 73*  CREATININE 4.33* 3.95* 3.48*  3.03* 2.63* 2.53*  CALCIUM 9.3 8.9 8.7* 8.6* 8.5* 8.3*  PHOS  --   --  4.5 4.4  --   --    CBC  Recent Labs Lab 04/08/17 0337 04/09/17 0433 04/12/17 0435  WBC 7.1 6.9 5.9  NEUTROABS 5.6  --   --   HGB 12.8* 13.0 12.5*  HCT 36.7* 38.3* 36.2*  MCV 93.4 93.4 91.9  PLT 72* 71* 76*    @IMGRELPRIORS @ Medications:    . allopurinol  100 mg Oral Q breakfast  . diphenhydrAMINE  50 mg Oral Once  . gentamicin cream  1 application Topical Daily  . lactulose  20 g Oral TID  . loratadine  10 mg Oral Daily  . metoprolol  50 mg Oral BID  . mupirocin cream   Topical BID  . potassium chloride  40 mEq Oral BID  . sodium chloride flush  3 mL Intravenous Q12H  . terazosin  5 mg Oral BH-q7a  . torsemide  40 mg Oral Daily  . warfarin  1 mg Oral ONCE-1800  . Warfarin - Pharmacist Dosing Inpatient   Does not apply q1800     Madelon Lips MD 04/14/2017, 9:47 AM

## 2017-04-14 NOTE — Progress Notes (Signed)
[place] NOTE  [name], MD CHE:[id number] DOB: October 03, 1941 DOA: 04/08/2017 PCP: Leotis Pain, DO   Brief HPI:    76 y.o.malewith medical history significant of ESRD, CHF combine last EF at 45 % by ECHO 2017, , CAD S/P CABG 2006, A fib on Xarelto,aortic stenosis and tricuspid regurgitation status post bioprosthetic AVR 2012, Liver Failure who presents complaining of abdominal discomfort that started overnight. He underwent umbilical hernia repair and peritoneal catheter placement 4-13 by Dr Raul Del. Patient had abdominal binder in place. Patient also notice swelling of scrotum and penis. ED physician discussed with Dr Raul Del, it is ok to remove binder.  Patient was mildly confuse, notice to have swelling scrotum and penis. Sodium at 129, BUN 133, Cr 4.3, ammonia 79, Bili 1.6, Hb at 12, Platelet 72, INR 2.0    Subjective   Patient seen and examined, Denies shortness of breath or pain.   Assessment/Plan:     1. Acute hepatic encephalopathy-Resolved, patient came with confusion ammonia was 79 on admission. Started on lactulose.  Last ammonia was 97 Continue lactulose 20 g 3 times a day. Mental status is clear. Will repeat ammonia level in a.m. 2. ESRD- patient underwent peritoneal catheter placement. Started on peritoneal dialysis on 04/09/2017. patient is supposed to start PD training on 424 at [facility]. Patient to get PD while in the hospital 3. Hypokalemia- potassium replaced as per nephrology 4. Scrotal swelling- patient recently underwent laparoscopically assisted obliquity repair and peritoneal catheter placement at [facility]. He was seen by surgery for scrotal swelling, the swelling likely from edema and no active bleeding. Gen. surgery recommends patient to be followed up by surgeon who did the procedure. no changes. Continue peritoneal dialysis, as per nephrology. 5. Atrial fibrillation- continue metoprolol 50 mg by mouth twice a day, Xarelto  was changed to warfarin as it was not recommended for ESRD patient.  continue warfarin per pharmacy consultation. INR is 4.11 6. Left hand wound- patient had left hand wound caused by dog scratch, started on Bactroban cream topically on left hand. Stable 7. Chronic combined systolic and diastolic CHF- appears compensated. Started on peritoneal dialysis on 04/09/2017 as above.  8. Congestive hepatopathy- patient gets paracentesis, last paracentesis was on 03/06/2017. Also had 2 L removed during recent surgery on 04/05/2017. Continue lactulose. When necessary paracentesis. Recheck ammonia level in a.m.   DVT prophylaxis: Warfarin  Code Status: Full code  Family Communication: No family present at bedside   Disposition Plan: patient and his wife are looking to go to inpatient rehabilitation. Will order CIR consult.    Consultants:  Nephrology  Procedures:  None   Continuous infusions . sodium chloride    . dialysis solution 2.5% low-MG/low-CA    . dialysis solution 4.25% low-MG/low-CA        Antibiotics:   Anti-infectives    Start     Dose/Rate Route Frequency Ordered Stop   04/08/17 1030  ciprofloxacin (CIPRO) tablet 250 mg     250 mg Oral Daily 04/08/17 0945 04/10/17 1026       Objective   Vitals:   04/13/17 1744 04/13/17 2012 04/13/17 2218 04/14/17 0540  BP: 131/64 126/69 131/68 139/73  Pulse: 60 (!) 54 (!) 55 (!) 51  Resp: 18 18 17 16   Temp: 98.2 F (36.8 C) 98.5 F (36.9 C) 97.5 F (36.4 C) 97.8 F (36.6 C)  TempSrc: Oral Oral Axillary Axillary  SpO2: 98%  99% 100%  Weight:   105.9 kg (  233 lb 7.5 oz)   Height:        Intake/Output Summary (Last 24 hours) at 04/14/17 1356 Last data filed at 04/14/17 0920  Gross per 24 hour  Intake             5008 ml  Output             6976 ml  Net            -1968 ml   Filed Weights   04/12/17 2005 04/12/17 2011 04/13/17 2218  Weight: 101.2 kg (223 lb 1.7 oz) 101 kg (222 lb 10.6 oz) 105.9 kg (233 lb 7.5 oz)      Physical Examination  Physical Exam: Eyes: No icterus, extraocular muscles intact  Mouth: Oral mucosa is moist, no lesions on palate,  Neck: Supple, no deformities, masses, or tenderness Lungs: Normal respiratory effort, bilateral clear to auscultation, no crackles or wheezes.  Heart: Regular rate and rhythm, S1 and S2 normal, no murmurs, rubs auscultated Abdomen: BS normoactive,soft,nondistended,non-tender to palpation,no organomegaly, marked scrotal swelling, erythema noted on the scrotum. Extremities: No pretibial edema, no erythema, no cyanosis, no clubbing Neuro : Alert and oriented to time, place and person, No focal deficits Skin: No rashes seen on exam     Data Reviewed: I have personally reviewed following labs and imaging studies  CBG:  Recent Labs Lab 04/08/17 1810  GLUCAP 144*    CBC:  Recent Labs Lab 04/08/17 0337 04/09/17 0433 04/12/17 0435  WBC 7.1 6.9 5.9  NEUTROABS 5.6  --   --   HGB 12.8* 13.0 12.5*  HCT 36.7* 38.3* 36.2*  MCV 93.4 93.4 91.9  PLT 72* 71* 76*    Basic Metabolic Panel:  Recent Labs Lab 04/09/17 0433 04/10/17 0900 04/11/17 0946 04/13/17 0310 04/14/17 0629  NA 131* 131* 131* 133* 132*  K 3.4* 3.3* 3.3* 2.8* 2.9*  CL 94* 92* 93* 94* 94*  CO2 23 25 28 28 29   GLUCOSE 127* 131* 156* 190* 132*  BUN 113* 99* 92* 82* 73*  CREATININE 3.95* 3.48* 3.03* 2.63* 2.53*  CALCIUM 8.9 8.7* 8.6* 8.5* 8.3*  PHOS  --  4.5 4.4  --   --     No results found for this or any previous visit (from the past 240 hour(s)).   Liver Function Tests:  Recent Labs Lab 04/08/17 0337 04/10/17 0900 04/11/17 0946  AST 20  --   --   ALT 9*  --   --   ALKPHOS 72  --   --   BILITOT 1.6*  --   --   PROT 6.0*  --   --   ALBUMIN 3.3* 2.7* 2.5*   No results for input(s): LIPASE, AMYLASE in the last 168 hours.  Recent Labs Lab 04/08/17 0331 04/09/17 1814 04/10/17 0444 04/11/17 0408 04/12/17 0435  AMMONIA 79* 84* 66* 47* 97*        Studies: No results found.  Scheduled Meds: . allopurinol  100 mg Oral Q breakfast  . diphenhydrAMINE  50 mg Oral Once  . gentamicin cream  1 application Topical Daily  . lactulose  20 g Oral TID  . loratadine  10 mg Oral Daily  . metoprolol  50 mg Oral BID  . mupirocin cream   Topical BID  . potassium chloride  40 mEq Oral BID  . sodium chloride flush  3 mL Intravenous Q12H  . terazosin  5 mg Oral BH-q7a  . torsemide  40 mg Oral Daily  .  warfarin  1 mg Oral ONCE-1800  . Warfarin - Pharmacist Dosing Inpatient   Does not apply q1800      Time spent: 25 min  [facility] Pager [phone]. If 7PM-7AM, please contact night-coverage at [email or web address], Office  [phone]  password [email or web address] 04/14/2017, 1:56 PM  LOS: 6 days

## 2017-04-14 NOTE — Progress Notes (Signed)
RT NOTE:  Pt has home CPAP @ bedside. Pt is able to put on when ready for sleep.

## 2017-04-15 LAB — PROTIME-INR
INR: 2.82
PROTHROMBIN TIME: 30.2 s — AB (ref 11.4–15.2)

## 2017-04-15 MED ORDER — WARFARIN SODIUM 2.5 MG PO TABS
2.5000 mg | ORAL_TABLET | Freq: Once | ORAL | Status: AC
Start: 2017-04-15 — End: 2017-04-15
  Administered 2017-04-15: 2.5 mg via ORAL
  Filled 2017-04-15: qty 1

## 2017-04-15 MED ORDER — POLYVINYL ALCOHOL 1.4 % OP SOLN
1.0000 [drp] | OPHTHALMIC | Status: DC | PRN
  Filled 2017-04-15: qty 15

## 2017-04-15 MED ORDER — TRAMADOL HCL 50 MG PO TABS
100.0000 mg | ORAL_TABLET | Freq: Four times a day (QID) | ORAL | Status: DC | PRN
  Administered 2017-04-15 (×2): 100 mg via ORAL
  Filled 2017-04-15 (×2): qty 2

## 2017-04-15 NOTE — Progress Notes (Signed)
Pt having abdominal pain states it is worse than before. Paged MD to inform. Pt had a BM, states he has been very constipated. Pt also vomited. After vomiting states he feels better. Pt had a national drink this afternoon and states that's what caused it. MD came to bedside and assessed, no new orders. Pt states he feels better. Will continue to monitor, Wife at bedside   [place]

## 2017-04-15 NOTE — H&P (Signed)
Physical Medicine and Rehabilitation Admission H&P    Chief Complaint  Patient presents with  . Post-op Problem  . 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 Per chart review patient lives with spouse independent with assistive device prior to admission but generalized decline over the past few weeks and has been essentially bedbound. 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 Presented 04/08/2017 with altered mental status, scrotal edema and abdominal discomfort. No chest pain. Sodium 129, BUN 133, creatinine 4.3, ammonia 79. Started on lactulose. Noted recent paracentesis 03/06/2017 for hepatopathy with 2 L yield Hemodialysis/CCPD ongoing. Follow-up Gen. surgery for scrotal edema no active bleeding with conservative care.WOC follow-up for left dorsal hand wound from a dog scratch with skin care as directed. Lactulose adjusted latest ammonia level of 77.Xarelto was not recommended while with dialysis and switched to Coumadin. Physical occupational therapy evaluations completed. Patient was admitted for a comprehensive rehabilitation program  Review of Systems  Constitutional: Negative for chills and fever.  HENT: Negative for hearing loss.   Eyes: Negative for blurred vision and double vision.  Respiratory: Positive for shortness of breath. Negative for cough.   Cardiovascular: Positive for palpitations and leg swelling.    Gastrointestinal: Positive for constipation. Negative for nausea and vomiting.  Musculoskeletal: Positive for joint pain.  Skin: Negative for rash.  Neurological: Positive for weakness. Negative for headaches.  Psychiatric/Behavioral:       Anxiety  All other systems reviewed and are negative.  Past Medical History:  Diagnosis Date  . A-fib ([place])    permanent with tachy-brady syndrome  . Anemia   . Anxiety   . Atrial flutter ([facility])    hx of  . Biceps tendon tear    right  . Bone marrow disease   . Cancer ([place])    basal cell  . CHF (congestive heart failure) ([place])   . Chronic kidney disease (CKD), stage III (moderate)    lov note dr Koleen Nimrod nephrology 09-17-13 on chart  . Complication of anesthesia 11-16-13   required alot of versed per anesthesia with cataract surgery  . Coronary heart disease    stent, CABG, RBBB  . DDD (degenerative disc disease)   . Diabetes ([place])   . Diabetes mellitus   . Fungal toenail infection   . Gout   . Hemorrhagic cystitis 2012  . Hepatitis    mono  . HTN (hypertension)   . Hypercalcemia 2011   due to sarcoidiosis  . Myocardial infarction [facility])    1995, 2002, 2006  . OSA (obstructive sleep apnea)    use bipap setting of 10 and 12  . Osteoarthritis   . Pneumonia 1966, 2011   hx of  . Presence of permanent cardiac pacemaker   . Right sciatic nerve pain    for block thursday 01-21-2014, injections  . Sarcoid 2011   pulmonary and bone marrow  . Shortness of breath dyspnea   . Synovial cyst of lumbar spine    l3-l4, l4-l5, injections  .  Valvular heart disease    aortic stenosis/regurgitation   Past Surgical History:  Procedure Laterality Date  . BASAL CELL CARCINOMA EXCISION Right 2015   ear  . CARDIAC CATHETERIZATION N/A 12/10/2016   Procedure: Right Heart Cath;  Surgeon: Jolaine Artist, MD;  Location: [place] CV LAB;  Service: Cardiovascular;  Laterality: N/A;  . CARDIOVERSION N/A 12/30/2013   Procedure: CARDIOVERSION;   Surgeon: [name], MD;  Location: [facility] ENDOSCOPY;  Service: Cardiovascular;  Laterality: N/A;  10:49 cardioversion at 120 joules, then 150 joules, to SB  used  Lido 65m,  Propofol 160 mcg  . CARDIOVERSION  2003, 2006, 2012, 2013  . cataract surgery Right 2007  . cataract surgery Left 11-16-13  . COLONOSCOPY N/A 01/29/2014   Procedure: COLONOSCOPY;  Surgeon: JWinfield Cunas, MD;  [facility];  Service: Endoscopy;  Laterality: N/A;  amanda//ja  . CORONARY ARTERY BYPASS GRAFT  2006   x 5  . EP IMPLANTABLE DEVICE N/A 05/05/2015   Procedure: Pacemaker Implant;  Surgeon: JThompson Grayer MD;  [facility];  Service: Cardiovascular;  Laterality: N/A;  . FLEXIBLE SIGMOIDOSCOPY N/A 12/07/2015   Procedure: FLEXIBLE SIGMOIDOSCOPY;  Surgeon: VWilford Corner MD;  [facility];  Service: Endoscopy;  Laterality: N/A;  Unprepped  . INSERT / REPLACE / REMOVE PACEMAKER  05/05/2015  . PORTACATH PLACEMENT    . portacath removed    . Redo Median sternotomy, extracorporeal cirulation, AVR, Tricuspid valve repair  06/13/2011   AVR(23-mm Edwards pericardial Magna-Ease valve./ TVrepair (34-mm Edwards MC3 annuloplasty ring  . TOTAL KNEE ARTHROPLASTY Right 05/24/2014   Procedure: RIGHT TOTAL KNEE ARTHROPLASTY;  Surgeon: FGearlean Alf MD;  Location: [facility] ORS;  Service: Orthopedics;  Laterality: Right;  . TRANSURETHRAL RESECTION OF PROSTATE  oct. 2012   TURP  . VASECTOMY  1977   Family History  Problem Relation Age of Onset  . Heart attack Father   . Stroke Father   . Stroke Mother   . Allergies    . Asthma    . Heart disease    . Cancer     Social History:  reports that he quit smoking about 21 years ago. His smoking use included Pipe and Cigars. He quit after 28.00 years of use. He has never used smokeless tobacco. He reports that he does not drink alcohol or use drugs. Allergies:  Allergies  Allergen Reactions  . Actos [Pioglitazone Hydrochloride] Swelling and  Other (See Comments)    Severe peripheral edema  . Codeine Other (See Comments)    Makes Pt aggressive  . Depakote [Divalproex Sodium] Diarrhea    severe  . Diltiazem Other (See Comments)    lethargy and dyspnea  . Hydralazine Other (See Comments)    Severe chills and SOB   . Isosorbide Dinitrate Other (See Comments)    Severe chills and SOB   . Januvia [Sitagliptin] Diarrhea and Other (See Comments)    bradycardia  . Loop Diuretics Other (See Comments)    Spike in BUN and creatine levels  . Losartan Other (See Comments)    aphasia  . Nsaids Other (See Comments)    Renal problems  . Onglyza [Saxagliptin] Diarrhea and Other (See Comments)    bradycardia  . Orudis [Ketoprofen] Other (See Comments)    Cannot take due to renal insufficiency  . Penicillins Swelling and Other (See Comments)    Serum sickness Has patient had a PCN reaction causing immediate rash, facial/tongue/throat swelling, SOB or lightheadedness with  hypotension: Yes Has patient had a PCN reaction causing severe rash involving mucus membranes or skin necrosis: No Has patient had a PCN reaction that required hospitalization Yes Has patient had a PCN reaction occurring within the last 10 years: No If all of the above answers are "NO", then may proceed with Cephalosporin use.   [name] Potassium Iodide Itching and Rash    Severe entire body itching and rash  . Rozerem [Ramelteon] Diarrhea    Severe   . Amiodarone Other (See Comments)    diarrhea  . Latex Swelling  . Verapamil Other (See Comments)    Severe fatigue  . Benazepril Hcl Other (See Comments)    ? Possible lowers platelets  . Indomethacin Other (See Comments)    Renal problems   . Minocycline Other (See Comments)    Makes dizzy and felt just lousy.  [name] Pentazocine Lactate Other (See Comments)    Unknown allergic reaction - pt and wife do not recall this  . Poison Ivy Extract [Poison Ivy Extract] Rash and Other (See Comments)    blisters  .  Sertraline Hcl Other (See Comments)    Pt and wife do not recall this  . Shellfish Allergy Rash   Medications Prior to Admission  Medication Sig Dispense Refill  . acetaminophen (TYLENOL) 500 MG tablet Take 1,000 mg by mouth 2 (two) times daily as needed (pain).     [name] allopurinol (ZYLOPRIM) 100 MG tablet Take 200 mg by mouth daily with breakfast.     . cetirizine (ZYRTEC) 10 MG tablet Take 10 mg by mouth daily as needed for allergies.     . diazepam (VALIUM) 10 MG tablet Take 10 mg by mouth daily as needed (ankle and leg cramps).   0  . dicyclomine (BENTYL) 10 MG capsule Take 10 mg by mouth every morning.     . diphenoxylate-atropine (LOMOTIL) 2.5-0.025 MG per tablet Take 2 tablets by mouth 2 (two) times daily as needed for diarrhea or loose stools. stomach  1  . insulin glargine (LANTUS) 100 unit/mL SOPN Inject 2-4 Units into the skin daily as needed (CBG >120). CBG 120-140 2 units, 141-160 3 units, 161-180 4 units, 181-200 5 units, 201-220 6 units    . lactulose (CHRONULAC) 10 GM/15ML solution Take 20 g by mouth 3 (three) times daily as needed for constipation.    . metolazone (ZAROXOLYN) 2.5 MG tablet TAKE ONE TABLET BY MOUTH ONCE WEEKLY ON FRIDAYS  AS NEEDED WITH 20MEQ POTASSIUM 5 tablet 2  . metoprolol (LOPRESSOR) 50 MG tablet Take 1 tablet (50 mg total) by mouth 2 (two) times daily. 60 tablet 11  . potassium chloride SA (K-DUR,KLOR-CON) 20 MEQ tablet Take 20 mEq by mouth every Saturday.    . Rivaroxaban (XARELTO) 15 MG TABS tablet Take 1 tablet (15 mg total) by mouth daily with supper. 30 tablet 5  . sucroferric oxyhydroxide (VELPHORO) 500 MG chewable tablet Chew 500 mg by mouth 3 (three) times daily as needed (high phosphorus).     . terazosin (HYTRIN) 5 MG capsule Take 1 capsule (5 mg total) by mouth every morning. 30 capsule 6  . Testosterone Cypionate 200 MG/ML KIT Inject 1 mL into the muscle every 14 (fourteen) days.    [name] torsemide (DEMADEX) 20 MG tablet Take 40 mg by mouth every  morning.     . traMADol (ULTRAM) 50 MG tablet Take 50 mg by mouth every 6 (six) hours as needed for pain.    [name] zolpidem (AMBIEN)  10 MG tablet Take 10 mg by mouth at bedtime as needed for sleep.       Home: Home Living Family/patient expects to be discharged to:: Private residence Living Arrangements: Spouse/significant other Available Help at Discharge: Family, Available 24 hours/day, Friend(s) Type of Home: House Home Access: Stairs to enter [employer] of Steps: 2 Entrance Stairs-Rails: Left Home Layout: Two level, Able to live on main level with bedroom/bathroom Alternate Level Stairs-Number of Steps: Stair lift Bathroom Shower/Tub: Walk-in shower Home Equipment: Environmental consultant - 2 wheels, Grab bars - tub/shower, Grab bars - toilet, Toilet riser, Bedside commode, Wheelchair - manual, Other (comment) Additional Comments: attempting to get hospital bed at home.    Functional History: Prior Function Level of Independence: Independent Comments: Pt was able to perform ADLs prior to surgery on 04/05/17  Functional Status:  Mobility: Bed Mobility Overal bed mobility: Needs Assistance Bed Mobility: Rolling, Sidelying to Sit Rolling: +2 for safety/equipment, Min assist Sidelying to sit: Mod assist, +2 for safety/equipment Sit to supine: Max assist, +2 for physical assistance General bed mobility comments: Cues for technqiue and use of rail; Light mod assist to elevate tunk to sitting position Transfers Overall transfer level: Needs assistance Equipment used: Rolling walker (2 wheeled) Transfer via Lift Equipment: Stedy Transfers: Sit to/from Stand Sit to Stand: Mod assist, +2 physical assistance, Max assist, +2 safety/equipment General transfer comment: Pt performed sit<>stand using sara steady with Mod A. Then performed 3 reps of terminal stance in the [place]; Required Max A for control and safety with decent to recliner seat Ambulation/Gait General Gait Details: Hopeful to be  able to take steps next session    ADL: ADL Overall ADL's : Needs assistance/impaired Grooming: Oral care, Minimal assistance, Moderate assistance, Sitting Grooming Details (indicate cue type and reason): Pt performed groomign at EOB. Presents with posterior lean when performign tasks at EOB and required Min-Mod A. Pt demonstrates good correction of posture with VCs Upper Body Bathing: Sitting, Moderate assistance Lower Body Bathing: Maximal assistance, Sit to/from stand Upper Body Dressing : Maximal assistance, Sitting Lower Body Dressing: Maximal assistance, Sit to/from stand Lower Body Dressing Details (indicate cue type and reason): Donned pt's socks for him due to pain with lean forwards becasue of scrodal edema Toilet Transfer: Maximal assistance, Stand-pivot (Simulated to reliner with Clarise Cruz stedy) Toilet Transfer Details (indicate cue type and reason): Wife A with using urinal due to swelling  Toileting- Clothing Manipulation and Hygiene: Maximal assistance Toileting - Clothing Manipulation Details (indicate cue type and reason): Wife A with using urinal due to swelling   Cognition: Cognition Overall Cognitive Status: Within Functional Limits for tasks assessed Orientation Level: Oriented X4 Cognition Arousal/Alertness: Awake/alert Behavior During Therapy: WFL for tasks assessed/performed Overall Cognitive Status: Within Functional Limits for tasks assessed  Physical Exam: Blood pressure (!) 140/58, pulse (!) 54, temperature 98.2 F (36.8 C), temperature source Oral, resp. rate 17, height 5' 11"  (1.803 m), weight 101 kg (222 lb 10.6 oz), SpO2 100 %. Physical Exam  Vitals reviewed. Constitutional: He appears well-developed.  HENT:  Head: Normocephalic and atraumatic.  Eyes: EOM are normal. Left eye exhibits no discharge.  Neck: Normal range of motion. Neck supple. No JVD present. No tracheal deviation present. No thyromegaly present.  Cardiovascular: Exam reveals no friction  rub.   No murmur heard. Cardiac rate controlled  Respiratory: Effort normal and breath sounds normal. No respiratory distress. He has no wheezes. He has no rales.  GI: Soft. Bowel sounds are normal. There is no rebound.  PD catheter noted. Abdomen slightly tender with palpation  Skin.warm and dry. Dry dressing to left hand. Incisions on abdomen with sutures/intact. Some scattered bruising around abdomen.  GU: persistent penile edema with mild weeping Neurological: CN exam grossly intact. Fair insight and awareness. Follows simple commands. STM deficits.  Bilateral UE strength grossly 3- to 3/5 prox to 4/5 distally. RUE remains somewhat limited due to biceps tendon injury. Bilateral LE: 3-/5 HF, 3/5 KE and 4/5 ADF/PF. Sensation intact to LT.  Psych: pleasant and appropriate    : Results for orders placed or performed during the hospital encounter of 04/08/17 (from the past 48 hour(s))  Protime-INR     Status: Abnormal   Collection Time: 04/14/17  6:29 AM  Result Value Ref Range   Prothrombin Time 35.1 (H) 11.4 - 15.2 seconds   INR 2.77   Basic metabolic panel     Status: Abnormal   Collection Time: 04/14/17  6:29 AM  Result Value Ref Range   Sodium 132 (L) 135 - 145 mmol/L   Potassium 2.9 (L) 3.5 - 5.1 mmol/L   Chloride 94 (L) 101 - 111 mmol/L   CO2 29 22 - 32 mmol/L   Glucose, Bld 132 (H) 65 - 99 mg/dL   BUN 73 (H) 6 - 20 mg/dL   Creatinine, Ser 2.53 (H) 0.61 - 1.24 mg/dL   Calcium 8.3 (L) 8.9 - 10.3 mg/dL   GFR calc non Af Amer 23 (L) >60 mL/min   GFR calc Af Amer 27 (L) >60 mL/min    Comment: (NOTE) The eGFR has been calculated using the CKD EPI equation. This calculation has not been validated in all clinical situations. eGFR's persistently <60 mL/min signify possible Chronic Kidney Disease.    Anion gap 9 5 - 15  Ammonia     Status: Abnormal   Collection Time: 04/14/17  2:43 PM  Result Value Ref Range   Ammonia 77 (H) 9 - 35 umol/L  Protime-INR     Status: Abnormal    Collection Time: 04/15/17  5:40 AM  Result Value Ref Range   Prothrombin Time 30.2 (H) 11.4 - 15.2 seconds   INR 2.82    No results found.     Medical Problem List and Plan: 1.  Weakness with cognitive deficits secondary to hepatic encephalopathy and multiple medical issues  -admit to inpatient rehab 2.  DVT Prophylaxis/Anticoagulation: Chronic Coumadin. Monitor for rebleeding episodes 3. Pain Management: Valium 10 mg daily as needed, Ultram 100 mg every 6 hours as needed 4. Mood: Provide emotional support 5. Neuropsych: This patient is capable of making decisions on his own behalf. 6. Skin/Wound Care: Cleanse wounds to left hand informed normal saline. Apply Bactroban Vaseline gauze for atraumatic dressing removed. Wrap with Kerlix changed daily 7. Fluids/Electrolytes/Nutrition: Routine I&O will follow-up chemistries 8. ESRD/CPPD. Status post peritoneal dialysis catheter 04/05/2017. Follow-up renal services 9. 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 Monitor for any signs of fluid  -follow daily weights 10. 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 Status post paracentesis 03/06/2017. Continue lactulose. Follow-up ammonia level 11. Atrial fibrillation. Continue Coumadin. Cardiac rate control 12. Hypertension. Lopressor 50 mg twice a day, Hytrin 5 mg daily, Demadex 40 mg daily 13. Anemia of chronic disease. Follow-up CBC 14. History of gout. Allopurinol 100 mg daily. Monitor for any gout flareups  Post Admission Physician Evaluation: 1. Functional deficits secondary  to hepatic encephalopathy/debility. 2. Patient is admitted to receive collaborative, interdisciplinary care between the physiatrist, rehab nursing staff, and  therapy team. 3. Patient's level of medical complexity and substantial therapy needs in context of that medical necessity cannot be provided at a lesser intensity of care such as a SNF. 4. Patient has experienced  substantial functional loss from his/her baseline which was documented above under the "Functional History" and "Functional Status" headings.  Judging by the patient's diagnosis, physical exam, and functional history, the patient has potential for functional progress which will result in measurable gains while on inpatient rehab.  These gains will be of substantial and practical use upon discharge  in facilitating mobility and self-care at the household level. 5. Physiatrist will provide 24 hour management of medical needs as well as oversight of the therapy plan/treatment and provide guidance as appropriate regarding the interaction of the two. 6. The Preadmission Screening has been reviewed and patient status is unchanged unless otherwise stated above. 7. 24 hour rehab nursing will assist with bladder management, bowel management, safety, skin/wound care, disease management, medication administration, pain management and patient education  and help integrate therapy concepts, techniques,education, etc. 8. PT will assess and treat for/with: Lower extremity strength, range of motion, stamina, balance, functional mobility, safety, adaptive techniques and equipment, pain control, family education, community reintegration.   Goals are: supervision to min assist. 9. OT will assess and treat for/with: ADL's, functional mobility, safety, upper extremity strength, adaptive techniques and equipment, NMR, pain control, ego support, family education.   Goals are: supervision to min assist. Therapy may not yet proceed with showering this patient. 10. SLP will assess and treat for/with: cognition, communication.  Goals are: supervision to mod I. 11. Case Management and Social Worker will assess and treat for psychological issues and discharge planning. 12. Team conference will be held weekly to assess progress toward goals and to determine barriers to discharge. 13. Patient will receive at least 3 hours of therapy per  day at least 5 days per week. 14. ELOS: 14-18 days       15. Prognosis:  excellent     Meredith Staggers, MD, [place] [facility] 04/16/2017  Cathlyn Parsons., PA-C 04/15/2017

## 2017-04-15 NOTE — Progress Notes (Signed)
ANTICOAGULATION CONSULT NOTE - Follow Up Consult  Pharmacy Consult for warfarin Indication: atrial fibrillation  Patient Measurements: Height: 5\' 11"  (180.3 cm) Weight: 222 lb 10.6 oz (101 kg) IBW/kg (Calculated) : 75.3  Vital Signs: Temp: 98 F (36.7 C) (04/23 0458) Temp Source: Oral (04/23 0458) BP: 132/62 (04/23 0458) Pulse Rate: 57 (04/23 0458)  Labs:  Recent Labs  04/13/17 0310 04/14/17 0629 04/15/17 0540  LABPROT 41.3* 35.1* 30.2*  INR 4.11* 3.40 2.82  CREATININE 2.63* 2.53*  --     Assessment: 2 YOM with CKD V/new ESRD with peritoneal dialysis initialed on 04/09/17. The patient was on Xarelto 15 mg once daily PTA for anticoagulation however with new ESRD/PD patient was transitioned to warfarin. Last dose of Xarelto was 4/16 in the evening.  INR on admission (4/16) was 2- due to influence of Xarelto. INR stayed stable until 4/19 when it jumped to 3.9. INR peaked at 5.04 on 4/20.  INR continues to trend down after holding doses X 3 nights. Received warfarin 1mg  last evening. INR today 2.82.  Hgb 12.5, plts chronically low (currently 76). No bleeding noted in chart.   Goal of Therapy:  INR 2-3 Monitor platelets by anticoagulation protocol: Yes   Plan:  - Warfarin 2.5mg  x 1 tonight  - Daily INR while inpatient - Follow for s/s bleeding  Zannah Melucci D. Rhys Lichty, PharmD, BCPS Clinical Pharmacist Pager: [phone] 04/15/2017 8:41 AM

## 2017-04-15 NOTE — Progress Notes (Signed)
Pt has home CPAP and places himself on and off  

## 2017-04-15 NOTE — Progress Notes (Signed)
Patient was able to sit in recliner for five hours.Complained of  Soreness on his bottom while sitting.Skin intact on assessment.Will get Geomat for comfort.Two persons assist when getting out from and into bed.

## 2017-04-15 NOTE — Progress Notes (Signed)
Patient able to sit in recliner for 10 hrs with Geomat cushion on.

## 2017-04-15 NOTE — Progress Notes (Signed)
I met with pt and his wife at bedside to discuss an inpt rehab admission. They are both in agreement. Bed is not available today. I will clarify bed availability for the next 24-48 hrs. RN CM made aware. [phone]

## 2017-04-15 NOTE — Progress Notes (Addendum)
Pt resting in bed, states he feels better. No complaints at this time. Wife's phone number listed, she stated to call if staff needed anything.  Emerly Prak Leory Plowman

## 2017-04-15 NOTE — Evaluation (Signed)
Occupational Therapy Evaluation Patient Details Name: [name] STORLIE, MD MRN: [id number] DOB: 01/12/1941 Today's Date: 04/15/2017    History of Present Illness Pt is a 76 yo male admitted through ED on 04/08/17 with AMS resulting from hepatic encephalopathy and ascites. Pt was started on peritoneal dialysis and is planning on continuing this when he returns home. PMH significant for ischemic heart disease, CABG, DM, HTN, A-Fib, cardiac pacemaker, CHF.    Clinical Impression   PTA, pt was independent and living with his wife. Pt currently requires Min-Mod A to maintain sitting posture while performing grooming at EOB. Pt required Mod-Max A +2 for sit<>stand transfers using the Denna Haggard. Pt demonstrates increase motivation to participate in therapy and progress towards PLOF. Pt would benefit from continued skilled OT to increase his occupational performance and participation. Recommend dc to CIR for intense OT to facilitate safe dc home and return to PLOF as well as decrease caregiver burden.    Follow Up Recommendations  CIR;Supervision/Assistance - 24 hour    Equipment Recommendations  Other (comment) (Defer to next venue)    Recommendations for Other Services       Precautions / Restrictions Precautions Precautions: Fall Restrictions Weight Bearing Restrictions: No      Mobility Bed Mobility Overal bed mobility: Needs Assistance Bed Mobility: Rolling;Sidelying to Sit Rolling: Mod assist;+2 for safety/equipment Sidelying to sit: Mod assist;+2 for safety/equipment          Transfers Overall transfer level: Needs assistance   Transfers: Sit to/from Stand Sit to Stand: Mod assist;+2 physical assistance;Max assist;+2 safety/equipment         General transfer comment: Pt performed sit<>stand using sara steady with Mod A. Required Max A for decent to recliner seat    Balance Overall balance assessment: Needs assistance Sitting-balance support: No upper extremity  supported;Feet supported Sitting balance-Leahy Scale: Poor Sitting balance - Comments: Pt performed grooming at EOB and demosntrated good static sitting when not performing tasks but has increased posterior lean with tasks Postural control: Posterior lean Standing balance support: Bilateral upper extremity supported;During functional activity Standing balance-Leahy Scale: Poor Standing balance comment: pt required bilateral UE supports on RW and mod-max A x2 to stand for approximately 3 minutes                           ADL either performed or assessed with clinical judgement   ADL Overall ADL's : Needs assistance/impaired     Grooming: Oral care;Minimal assistance;Moderate assistance;Sitting Grooming Details (indicate cue type and reason): Pt performed groomign at EOB. Presents with posterior lean when performign tasks at EOB and required Min-Mod A. Pt demonstrates good correction of posture with VCs Upper Body Bathing: Sitting;Moderate assistance   Lower Body Bathing: Maximal assistance;Sit to/from stand   Upper Body Dressing : Maximal assistance;Sitting   Lower Body Dressing: Maximal assistance;Sit to/from stand Lower Body Dressing Details (indicate cue type and reason): Donned pt's socks for him due to pain with lean forwards becasue of scrodal edema Toilet Transfer: Maximal assistance;Stand-pivot (Simulated to reliner with Clarise Cruz stedy) Toilet Transfer Details (indicate cue type and reason): Wife A with using urinal due to swelling  Toileting- Clothing Manipulation and Hygiene: Maximal assistance Toileting - Clothing Manipulation Details (indicate cue type and reason): Wife A with using urinal due to swelling              Vision Baseline Vision/History: Wears glasses Wears Glasses: At all times  Perception     Praxis      Pertinent Vitals/Pain Pain Assessment: 0-10 Pain Score: 1  Pain Location: [place] due to edema (MD ordering scrodum sling) Pain  Descriptors / Indicators: Grimacing;Guarding;Sore Pain Intervention(s): Monitored during session     Hand Dominance Right   Extremity/Trunk Assessment Upper Extremity Assessment Upper Extremity Assessment: Generalized weakness   Lower Extremity Assessment Lower Extremity Assessment: Generalized weakness   Cervical / Trunk Assessment Cervical / Trunk Assessment: Normal   Communication Communication Communication: No difficulties   Cognition Arousal/Alertness: Awake/alert Behavior During Therapy: WFL for tasks assessed/performed Overall Cognitive Status: Within Functional Limits for tasks assessed                                     General Comments  Wife present for session.  Pt educated on use of pillow case to increase comfort for edema and MD will order scrotal sling.     Exercises     Shoulder Instructions      Home Living Family/patient expects to be discharged to:: Private residence Living Arrangements: Spouse/significant other Available Help at Discharge: Family;Available 24 hours/day;Friend(s) Type of Home: House Home Access: Stairs to enter [employer] of Steps: 2 Entrance Stairs-Rails: Left Home Layout: Two level;Able to live on main level with bedroom/bathroom Alternate Level Stairs-Number of Steps: Stair lift   Bathroom Shower/Tub: Walk-in shower         Home Equipment: Walker - 2 wheels;Grab bars - tub/shower;Grab bars - toilet;Toilet riser;Bedside commode;Wheelchair - manual;Other (comment)   Additional Comments: attempting to get hospital bed at home.       Prior Functioning/Environment Level of Independence: Independent        Comments: Pt was able to perform ADLs prior to surgery on 04/05/17        OT Problem List: Decreased strength;Decreased activity tolerance;Impaired balance (sitting and/or standing);Pain;Decreased knowledge of use of DME or AE;Decreased safety awareness      OT Treatment/Interventions:  Self-care/ADL training;Therapeutic exercise;Energy conservation;DME and/or AE instruction;Patient/family education;Therapeutic activities    OT Goals(Current goals can be found in the care plan section) Acute Rehab OT Goals Patient Stated Goal: to get rid of his swelling OT Goal Formulation: With patient Time For Goal Achievement: 04/29/17 Potential to Achieve Goals: Good ADL Goals Pt Will Perform Grooming: with set-up;with supervision;sitting Pt Will Perform Upper Body Bathing: with set-up;with supervision;sitting Pt Will Perform Lower Body Bathing: with min guard assist;sit to/from stand Pt Will Perform Upper Body Dressing: with set-up;with supervision;sitting Pt Will Perform Lower Body Dressing: with min guard assist;sit to/from stand Pt Will Transfer to Toilet: with min guard assist;stand pivot transfer;bedside commode  OT Frequency: Min 2X/week   Barriers to D/C:            Co-evaluation              End of Session Equipment Utilized During Treatment: Gait belt;Other (comment) Denna Haggard) Nurse Communication: Mobility status;Need for lift equipment  Activity Tolerance: Patient tolerated treatment well Patient left: in chair;with call bell/phone within reach;with chair alarm set;with family/visitor present  OT Visit Diagnosis: Unsteadiness on feet (R26.81);Muscle weakness (generalized) (M62.81);Pain Pain - part of body:  (Stomach and Scrodum)                Time: 1020-1102 OT Time Calculation (min): 42 min Charges:  OT General Charges $OT Visit: 1 Procedure OT Evaluation $OT Eval Moderate Complexity: 1 Procedure G-Codes:  [place], OTR/L [place] 04/15/2017, 12:26 PM

## 2017-04-15 NOTE — Progress Notes (Signed)
[place] NOTE  [name], MD WCH:[id number] DOB: 03-Aug-1941 DOA: 04/08/2017 PCP: Leotis Pain, DO   Brief HPI:    76 y.o.malewith medical history significant of ESRD, CHF combine last EF at 45 % by ECHO 2017, , CAD S/P CABG 2006, A fib on Xarelto,aortic stenosis and tricuspid regurgitation status post bioprosthetic AVR 2012, Liver Failure who presents complaining of abdominal discomfort that started overnight. He underwent umbilical hernia repair and peritoneal catheter placement 4-13 by Dr Raul Del. Patient had abdominal binder in place. Patient also notice swelling of scrotum and penis. ED physician discussed with Dr Raul Del, it is ok to remove binder.  Patient was mildly confuse, notice to have swelling scrotum and penis. Sodium at 129, BUN 133, Cr 4.3, ammonia 79, Bili 1.6, Hb at 12, Platelet 72, INR 2.0    Subjective   Patient seen and examined, complains of abdominal pain during fill in PD.   Assessment/Plan:     1. Acute hepatic encephalopathy-Resolved, patient came with confusion ammonia was 79 on admission. Started on lactulose.  Last ammonia was 97 Continue lactulose 20 g 3 times a day. Mental status is clear. Ammonia level is 77.  2. ESRD- patient underwent peritoneal catheter placement. Started on peritoneal dialysis on 04/09/2017. patient is supposed to start PD training on 424 at [facility]. Patient to get PD while in the hospital 3. Hypokalemia- potassium replaced as per nephrology 4. Scrotal swelling- patient recently underwent laparoscopically assisted obliquity repair and peritoneal catheter placement at [facility]. He was seen by surgery for scrotal swelling, the swelling likely from edema and no active bleeding. Gen. surgery recommends patient to be followed up by surgeon who did the procedure. no changes. Continue peritoneal dialysis, as per nephrology.Complains of abdominal pain with PD ,nephrology to address. 5. Atrial fibrillation-  continue metoprolol 50 mg by mouth twice a day, Xarelto was changed to warfarin as it was not recommended for ESRD patient.  Continue warfarin per pharmacy consultation. INR is 4.11 6. Left hand wound- patient had left hand wound caused by dog scratch, started on Bactroban cream topically on left hand. Stable 7. Chronic combined systolic and diastolic CHF- appears compensated. Started on peritoneal dialysis on 04/09/2017 as above.  8. Congestive hepatopathy- patient gets paracentesis, last paracentesis was on 03/06/2017. Also had 2 L removed during recent surgery on 04/05/2017. Continue lactulose. When necessary paracentesis.   DVT prophylaxis: Warfarin  Code Status: Full code  Family Communication: No family present at bedside   Disposition Plan: patient and his wife are looking to go to inpatient rehabilitation. CIR consulted    Consultants:  Nephrology  Procedures:  None   Continuous infusions . sodium chloride    . dialysis solution 2.5% low-MG/low-CA    . dialysis solution 4.25% low-MG/low-CA        Antibiotics:   Anti-infectives    Start     Dose/Rate Route Frequency Ordered Stop   04/08/17 1030  ciprofloxacin (CIPRO) tablet 250 mg     250 mg Oral Daily 04/08/17 0945 04/10/17 1026       Objective   Vitals:   04/14/17 2053 04/15/17 0458 04/15/17 0543 04/15/17 0917  BP: (!) 158/76 132/62  (!) 140/58  Pulse: 60 (!) 57  (!) 54  Resp: 17 16  17   Temp: 98.4 F (36.9 C) 98 F (36.7 C)  98.2 F (36.8 C)  TempSrc: Axillary Oral  Oral  SpO2: 94% 99%  100%  Weight: 105 kg (231 lb  7.7 oz)  101 kg (222 lb 10.6 oz)   Height:        Intake/Output Summary (Last 24 hours) at 04/15/17 1345 Last data filed at 04/15/17 0949  Gross per 24 hour  Intake             5514 ml  Output              750 ml  Net             4764 ml   Filed Weights   04/13/17 2218 04/14/17 2053 04/15/17 0543  Weight: 105.9 kg (233 lb 7.5 oz) 105 kg (231 lb 7.7 oz) 101 kg (222 lb 10.6 oz)      Physical Examination  Physical Exam: Eyes: No icterus, extraocular muscles intact  Mouth: Oral mucosa is moist, no lesions on palate,  Neck: Supple, no deformities, masses, or tenderness Lungs: Normal respiratory effort, bilateral clear to auscultation, no crackles or wheezes.  Heart: Regular rate and rhythm, S1 and S2 normal, no murmurs, rubs auscultated Abdomen: BS normoactive,soft,nondistended,mildly tender to palpation,no organomegaly. Scrotal edema. Extremities: No pretibial edema, no erythema, no cyanosis, no clubbing Neuro : Alert and oriented to time, place and person, No focal deficits     Data Reviewed: I have personally reviewed following labs and imaging studies  CBG:  Recent Labs Lab 04/08/17 1810  GLUCAP 144*    CBC:  Recent Labs Lab 04/09/17 0433 04/12/17 0435  WBC 6.9 5.9  HGB 13.0 12.5*  HCT 38.3* 36.2*  MCV 93.4 91.9  PLT 71* 76*    Basic Metabolic Panel:  Recent Labs Lab 04/09/17 0433 04/10/17 0900 04/11/17 0946 04/13/17 0310 04/14/17 0629  NA 131* 131* 131* 133* 132*  K 3.4* 3.3* 3.3* 2.8* 2.9*  CL 94* 92* 93* 94* 94*  CO2 23 25 28 28 29   GLUCOSE 127* 131* 156* 190* 132*  BUN 113* 99* 92* 82* 73*  CREATININE 3.95* 3.48* 3.03* 2.63* 2.53*  CALCIUM 8.9 8.7* 8.6* 8.5* 8.3*  PHOS  --  4.5 4.4  --   --     No results found for this or any previous visit (from the past 240 hour(s)).   Liver Function Tests:  Recent Labs Lab 04/10/17 0900 04/11/17 0946  ALBUMIN 2.7* 2.5*   No results for input(s): LIPASE, AMYLASE in the last 168 hours.  Recent Labs Lab 04/09/17 1814 04/10/17 0444 04/11/17 0408 04/12/17 0435 04/14/17 1443  AMMONIA 84* 66* 47* 97* 77*       Studies: No results found.  Scheduled Meds: . allopurinol  100 mg Oral Q breakfast  . diphenhydrAMINE  50 mg Oral Once  . gentamicin cream  1 application Topical Daily  . lactulose  20 g Oral TID  . loratadine  10 mg Oral Daily  . metoprolol  50 mg Oral  BID  . mupirocin cream   Topical BID  . potassium chloride  40 mEq Oral BID  . sodium chloride flush  3 mL Intravenous Q12H  . terazosin  5 mg Oral BH-q7a  . torsemide  40 mg Oral Daily  . warfarin  2.5 mg Oral ONCE-1800  . Warfarin - Pharmacist Dosing Inpatient   Does not apply q1800      Time spent: 25 min  [place] [facility] Pager [phone]. If 7PM-7AM, please contact night-coverage at [email or web address], Office  [phone]  password [email or web address] 04/15/2017, 1:45 PM  LOS: 7 days

## 2017-04-15 NOTE — Progress Notes (Signed)
Patient ID: [name], MD, male   DOB: Feb 06, 1941, 76 y.o.   MRN: [id number]  [facility] Progress Note    Subjective:   Scrotal edema somewhat improved.  Reports severe abdominal pain during fill with CCPD and abdominal cramps   Objective:   BP (!) 140/58   Pulse (!) 54   Temp 98.2 F (36.8 C) (Oral)   Resp 17   Ht 5\' 11"  (1.803 m)   Wt 101 kg (222 lb 10.6 oz)   SpO2 100%   BMI 31.06 kg/m   Intake/Output: I/O last 3 completed shifts: In: 07622 [P.O.:420; Other:9982] Out: 6333 [Urine:2250; Other:5601]   Intake/Output this shift:  Total I/O In: 120 [P.O.:120] Out: 0  Weight change: -0.9 kg (-1 lb 15.7 oz)  Physical Exam: Gen:WD obese WM in NAD CVS:no rub Resp:decreased BS at bases Abd:+BS, soft, mildly tender Ext:1+ edema of lower ext and some scrotal/penile edema  Labs: BMET  Recent Labs Lab 04/09/17 0433 04/10/17 0900 04/11/17 0946 04/13/17 0310 04/14/17 0629  NA 131* 131* 131* 133* 132*  K 3.4* 3.3* 3.3* 2.8* 2.9*  CL 94* 92* 93* 94* 94*  CO2 23 25 28 28 29   GLUCOSE 127* 131* 156* 190* 132*  BUN 113* 99* 92* 82* 73*  CREATININE 3.95* 3.48* 3.03* 2.63* 2.53*  ALBUMIN  --  2.7* 2.5*  --   --   CALCIUM 8.9 8.7* 8.6* 8.5* 8.3*  PHOS  --  4.5 4.4  --   --    CBC  Recent Labs Lab 04/09/17 0433 04/12/17 0435  WBC 6.9 5.9  HGB 13.0 12.5*  HCT 38.3* 36.2*  MCV 93.4 91.9  PLT 71* 76*    @IMGRELPRIORS @ Medications:    . allopurinol  100 mg Oral Q breakfast  . diphenhydrAMINE  50 mg Oral Once  . gentamicin cream  1 application Topical Daily  . lactulose  20 g Oral TID  . loratadine  10 mg Oral Daily  . metoprolol  50 mg Oral BID  . mupirocin cream   Topical BID  . potassium chloride  40 mEq Oral BID  . sodium chloride flush  3 mL Intravenous Q12H  . terazosin  5 mg Oral BH-q7a  . torsemide  40 mg Oral Daily  . warfarin  2.5 mg Oral ONCE-1800  . Warfarin - Pharmacist Dosing Inpatient   Does not apply L4562      Assessment/ Plan:   1. Right sided CHF with scrotal swelling.  CT scan negative for hernia or leak 2. ESRD doing well with CCPD.  Will increase volume to 1500 ml to improve clearance and increase fill time to help with abdominal pain/cramping.  I spoke with Dr. Raul Del regarding removing the sutures from the surgery and will wait until early next week due to ascites and hernia repair. 3. Anemia: stable 4. CKD-MBD: stable 5. Nutrition: renal diet, low K cont with supplementation and change diet without potassium restriction 6. Hypertension: stable 7. Hepatic encephalopathy- presumably related to right heart failure continue with lactulose 8. Deconditioning- PT/OT and CIR when bed available  Donetta Potts, MD [place] Pager [phone] 04/15/2017, 9:50 AM

## 2017-04-15 NOTE — Progress Notes (Signed)
Physical Therapy Treatment Patient Details Name: [name] RODRIGUEZ, MD MRN: [id number] DOB: [birth year] Today's Date: 04/15/2017    History of Present Illness Pt is a 76 yo male admitted through ED on 04/08/17 with AMS resulting from hepatic encephalopathy and ascites. Pt was started on peritoneal dialysis and is planning on continuing this when he returns home. PMH significant for ischemic heart disease, CABG, DM, HTN, A-Fib, cardiac pacemaker, CHF.     PT Comments    Continuing work on functional mobility and activity tolerance; Noting very nice gains in bed mobility and transfers today; worked on sit<>stand to and from [place], and also reps of half-sit to stand within [place]; Continue to recommend comprehensive inpatient rehab (CIR) for post-acute therapy needs.    Follow Up Recommendations  CIR;Supervision/Assistance - 24 hour     Equipment Recommendations  Other (comment) (defer to next venue)    Recommendations for Other Services       Precautions / Restrictions Precautions Precautions: Fall Restrictions Weight Bearing Restrictions: No    Mobility  Bed Mobility Overal bed mobility: Needs Assistance Bed Mobility: Rolling;Sidelying to Sit Rolling: +2 for safety/equipment;Min assist Sidelying to sit: Mod assist;+2 for safety/equipment       General bed mobility comments: Cues for technqiue and use of rail; Light mod assist to elevate tunk to sitting position  Transfers Overall transfer level: Needs assistance   Transfers: Sit to/from Stand Sit to Stand: Mod assist;+2 physical assistance;Max assist;+2 safety/equipment         General transfer comment: Pt performed sit<>stand using sara steady with Mod A. Then performed 3 reps of terminal stance in the [employer]; Required Max A for control and safety with decent to recliner seat  Ambulation/Gait             General Gait Details: Hopeful to be able to take steps next session   Stairs            Wheelchair  Mobility    Modified Rankin (Stroke Patients Only)       Balance Overall balance assessment: Needs assistance Sitting-balance support: No upper extremity supported;Feet supported Sitting balance-Leahy Scale: Poor Sitting balance - Comments: Pt performed grooming at EOB and demosntrated good static sitting when not performing tasks but has increased posterior lean, requiring some physical assist/support with tasks Postural control: Posterior lean Standing balance support: Bilateral upper extremity supported;During functional activity Standing balance-Leahy Scale: Poor Standing balance comment: pt required bilateral UE supports on RW and mod-max A x2 to stand for approximately 3 minutes                            Cognition Arousal/Alertness: Awake/alert Behavior During Therapy: WFL for tasks assessed/performed Overall Cognitive Status: Within Functional Limits for tasks assessed                                        Exercises      General Comments General comments (skin integrity, edema, etc.): Wife present for session      Pertinent Vitals/Pain Pain Assessment: 0-10 Pain Score: 1  Pain Location: abdomen and scrotum at beginning and end of session Pain Descriptors / Indicators: Grimacing;Guarding;Sore Pain Intervention(s): Monitored during session;Other (comment) (used pillow case to support)    Home Living Family/patient expects to be discharged to:: Private residence Living Arrangements: Spouse/significant other Available Help at Discharge: Family;Available  24 hours/day;Friend(s) Type of Home: House Home Access: Stairs to enter Entrance Stairs-Rails: Left Home Layout: Two level;Able to live on main level with bedroom/bathroom Home Equipment: Walker - 2 wheels;Grab bars - tub/shower;Grab bars - toilet;Toilet riser;Bedside commode;Wheelchair - manual;Other (comment) Additional Comments: attempting to get hospital bed at home.     Prior  Function Level of Independence: Independent      Comments: Pt was able to perform ADLs prior to surgery on 04/05/17   PT Goals (current goals can now be found in the care plan section) Acute Rehab PT Goals Patient Stated Goal: to get rid of his swelling PT Goal Formulation: With patient/family Time For Goal Achievement: 04/19/17 Potential to Achieve Goals: Good Progress towards PT goals: Progressing toward goals    Frequency    Min 3X/week      PT Plan Current plan remains appropriate    Co-evaluation PT/OT/SLP Co-Evaluation/Treatment: Yes Reason for Co-Treatment: Complexity of the patient's impairments (multi-system involvement);Necessary to address cognition/behavior during functional activity;For patient/therapist safety PT goals addressed during session: Mobility/safety with mobility       End of Session Equipment Utilized During Treatment: Gait belt Activity Tolerance: Patient tolerated treatment well Patient left: in chair;with call bell/phone within reach;with family/visitor present Nurse Communication: Mobility status;Need for lift equipment PT Visit Diagnosis: Other abnormalities of gait and mobility (R26.89);Muscle weakness (generalized) (M62.81);Pain Pain - part of body:  (Abdomen)     Time: 7262-0355 PT Time Calculation (min) (ACUTE ONLY): 42 min  Charges:  $Therapeutic Activity: 23-37 mins                    G Codes:       Roney Marion, PT  Acute Rehabilitation Services Pager [phone] Office [phone]    [place] 04/15/2017, 12:34 PM

## 2017-04-16 ENCOUNTER — Inpatient Hospital Stay (HOSPITAL_COMMUNITY)
Admission: RE | Admit: 2017-04-16 | Discharge: 2017-04-27 | DRG: 947 | Disposition: A | Discharge status: Home / Self Care | Payer: Medicare Other | Source: Intra-hospital | Attending: Physical Medicine & Rehabilitation | Admitting: Physical Medicine & Rehabilitation

## 2017-04-16 ENCOUNTER — Encounter (HOSPITAL_COMMUNITY): Payer: Self-pay | Admitting: Neurology

## 2017-04-16 DIAGNOSIS — R4189 Other symptoms and signs involving cognitive functions and awareness: Secondary | ICD-10-CM | POA: Diagnosis not present

## 2017-04-16 DIAGNOSIS — Z951 Presence of aortocoronary bypass graft: Secondary | ICD-10-CM | POA: Diagnosis not present

## 2017-04-16 DIAGNOSIS — Z7901 Long term (current) use of anticoagulants: Secondary | ICD-10-CM | POA: Diagnosis not present

## 2017-04-16 DIAGNOSIS — N186 End stage renal disease: Secondary | ICD-10-CM | POA: Diagnosis not present

## 2017-04-16 DIAGNOSIS — I132 Hypertensive heart and chronic kidney disease with heart failure and with stage 5 chronic kidney disease, or end stage renal disease: Secondary | ICD-10-CM

## 2017-04-16 DIAGNOSIS — I252 Old myocardial infarction: Secondary | ICD-10-CM | POA: Diagnosis not present

## 2017-04-16 DIAGNOSIS — M898X9 Other specified disorders of bone, unspecified site: Secondary | ICD-10-CM

## 2017-04-16 DIAGNOSIS — D696 Thrombocytopenia, unspecified: Secondary | ICD-10-CM

## 2017-04-16 DIAGNOSIS — R52 Pain, unspecified: Secondary | ICD-10-CM

## 2017-04-16 DIAGNOSIS — D638 Anemia in other chronic diseases classified elsewhere: Secondary | ICD-10-CM

## 2017-04-16 DIAGNOSIS — Z794 Long term (current) use of insulin: Secondary | ICD-10-CM

## 2017-04-16 DIAGNOSIS — Z85828 Personal history of other malignant neoplasm of skin: Secondary | ICD-10-CM

## 2017-04-16 DIAGNOSIS — E1122 Type 2 diabetes mellitus with diabetic chronic kidney disease: Secondary | ICD-10-CM | POA: Diagnosis not present

## 2017-04-16 DIAGNOSIS — Z955 Presence of coronary angioplasty implant and graft: Secondary | ICD-10-CM

## 2017-04-16 DIAGNOSIS — R791 Abnormal coagulation profile: Secondary | ICD-10-CM

## 2017-04-16 DIAGNOSIS — Z953 Presence of xenogenic heart valve: Secondary | ICD-10-CM

## 2017-04-16 DIAGNOSIS — M109 Gout, unspecified: Secondary | ICD-10-CM | POA: Diagnosis not present

## 2017-04-16 DIAGNOSIS — I4891 Unspecified atrial fibrillation: Secondary | ICD-10-CM

## 2017-04-16 DIAGNOSIS — K7682 Hepatic encephalopathy: Secondary | ICD-10-CM | POA: Diagnosis present

## 2017-04-16 DIAGNOSIS — I5032 Chronic diastolic (congestive) heart failure: Secondary | ICD-10-CM

## 2017-04-16 DIAGNOSIS — I2510 Atherosclerotic heart disease of native coronary artery without angina pectoris: Secondary | ICD-10-CM | POA: Diagnosis not present

## 2017-04-16 DIAGNOSIS — K7290 Hepatic failure, unspecified without coma: Secondary | ICD-10-CM | POA: Diagnosis not present

## 2017-04-16 DIAGNOSIS — E1169 Type 2 diabetes mellitus with other specified complication: Secondary | ICD-10-CM | POA: Diagnosis not present

## 2017-04-16 DIAGNOSIS — E876 Hypokalemia: Secondary | ICD-10-CM

## 2017-04-16 DIAGNOSIS — Z79899 Other long term (current) drug therapy: Secondary | ICD-10-CM

## 2017-04-16 DIAGNOSIS — E119 Type 2 diabetes mellitus without complications: Secondary | ICD-10-CM | POA: Diagnosis not present

## 2017-04-16 DIAGNOSIS — R5381 Other malaise: Secondary | ICD-10-CM | POA: Diagnosis present

## 2017-04-16 DIAGNOSIS — Z950 Presence of cardiac pacemaker: Secondary | ICD-10-CM

## 2017-04-16 DIAGNOSIS — F419 Anxiety disorder, unspecified: Secondary | ICD-10-CM

## 2017-04-16 DIAGNOSIS — G4700 Insomnia, unspecified: Secondary | ICD-10-CM

## 2017-04-16 DIAGNOSIS — I10 Essential (primary) hypertension: Secondary | ICD-10-CM

## 2017-04-16 DIAGNOSIS — G4733 Obstructive sleep apnea (adult) (pediatric): Secondary | ICD-10-CM

## 2017-04-16 DIAGNOSIS — Z96651 Presence of right artificial knee joint: Secondary | ICD-10-CM | POA: Diagnosis not present

## 2017-04-16 DIAGNOSIS — R197 Diarrhea, unspecified: Secondary | ICD-10-CM

## 2017-04-16 DIAGNOSIS — Z992 Dependence on renal dialysis: Secondary | ICD-10-CM

## 2017-04-16 DIAGNOSIS — E46 Unspecified protein-calorie malnutrition: Secondary | ICD-10-CM

## 2017-04-16 DIAGNOSIS — E7220 Disorder of urea cycle metabolism, unspecified: Secondary | ICD-10-CM

## 2017-04-16 DIAGNOSIS — K7460 Unspecified cirrhosis of liver: Secondary | ICD-10-CM

## 2017-04-16 DIAGNOSIS — Z87891 Personal history of nicotine dependence: Secondary | ICD-10-CM | POA: Diagnosis not present

## 2017-04-16 DIAGNOSIS — Z6831 Body mass index (BMI) 31.0-31.9, adult: Secondary | ICD-10-CM

## 2017-04-16 DIAGNOSIS — E1142 Type 2 diabetes mellitus with diabetic polyneuropathy: Secondary | ICD-10-CM

## 2017-04-16 DIAGNOSIS — T473X5A Adverse effect of saline and osmotic laxatives, initial encounter: Secondary | ICD-10-CM

## 2017-04-16 DIAGNOSIS — E669 Obesity, unspecified: Secondary | ICD-10-CM

## 2017-04-16 DIAGNOSIS — R159 Full incontinence of feces: Secondary | ICD-10-CM

## 2017-04-16 DIAGNOSIS — D631 Anemia in chronic kidney disease: Secondary | ICD-10-CM

## 2017-04-16 DIAGNOSIS — E8809 Other disorders of plasma-protein metabolism, not elsewhere classified: Secondary | ICD-10-CM

## 2017-04-16 LAB — AMMONIA: AMMONIA: 81 umol/L — AB (ref 9–35)

## 2017-04-16 LAB — COMPREHENSIVE METABOLIC PANEL
ALK PHOS: 95 U/L (ref 38–126)
ALT: 10 U/L — AB (ref 17–63)
AST: 23 U/L (ref 15–41)
Albumin: 2.4 g/dL — ABNORMAL LOW (ref 3.5–5.0)
Anion gap: 11 (ref 5–15)
BUN: 67 mg/dL — AB (ref 6–20)
CALCIUM: 8.4 mg/dL — AB (ref 8.9–10.3)
CHLORIDE: 98 mmol/L — AB (ref 101–111)
CO2: 27 mmol/L (ref 22–32)
CREATININE: 2.12 mg/dL — AB (ref 0.61–1.24)
GFR, EST AFRICAN AMERICAN: 33 mL/min — AB (ref >60)
GFR, EST NON AFRICAN AMERICAN: 28 mL/min — AB (ref >60)
Glucose, Bld: 219 mg/dL — ABNORMAL HIGH (ref 65–99)
Potassium: 3.4 mmol/L — ABNORMAL LOW (ref 3.5–5.1)
Sodium: 136 mmol/L (ref 135–145)
TOTAL PROTEIN: 5.4 g/dL — AB (ref 6.5–8.1)
Total Bilirubin: 1.9 mg/dL — ABNORMAL HIGH (ref 0.3–1.2)

## 2017-04-16 LAB — BODY FLUID CELL COUNT WITH DIFFERENTIAL
Eos, Fluid: 2 %
LYMPHS FL: 56 %
Monocyte-Macrophage-Serous Fluid: 37 % — ABNORMAL LOW (ref 50–90)
NEUTROPHIL FLUID: 5 % (ref 0–25)
WBC FLUID: 71 uL (ref 0–1000)

## 2017-04-16 LAB — CBC
HEMATOCRIT: 37 % — AB (ref 39.0–52.0)
Hemoglobin: 12.3 g/dL — ABNORMAL LOW (ref 13.0–17.0)
MCH: 31.1 pg (ref 26.0–34.0)
MCHC: 33.2 g/dL (ref 30.0–36.0)
MCV: 93.7 fL (ref 78.0–100.0)
PLATELETS: 82 10*3/uL — AB (ref 150–400)
RBC: 3.95 MIL/uL — AB (ref 4.22–5.81)
RDW: 16.9 % — ABNORMAL HIGH (ref 11.5–15.5)
WBC: 5.9 10*3/uL (ref 4.0–10.5)

## 2017-04-16 LAB — PHOSPHORUS: PHOSPHORUS: 3.1 mg/dL (ref 2.5–4.6)

## 2017-04-16 LAB — PROTIME-INR
INR: 2.59
Prothrombin Time: 28.2 seconds — ABNORMAL HIGH (ref 11.4–15.2)

## 2017-04-16 MED ORDER — TERAZOSIN HCL 5 MG PO CAPS
5.0000 mg | ORAL_CAPSULE | ORAL | Status: DC
  Administered 2017-04-17 – 2017-04-27 (×10): 5 mg via ORAL
  Filled 2017-04-16 (×12): qty 1

## 2017-04-16 MED ORDER — TRAMADOL HCL 50 MG PO TABS
100.0000 mg | ORAL_TABLET | Freq: Two times a day (BID) | ORAL | Status: DC | PRN
  Administered 2017-04-16: 100 mg via ORAL
  Filled 2017-04-16: qty 2

## 2017-04-16 MED ORDER — HEPARIN 1000 UNIT/ML FOR PERITONEAL DIALYSIS
INTRAPERITONEAL | Status: DC | PRN
  Filled 2017-04-16: qty 5000

## 2017-04-16 MED ORDER — SORBITOL 70 % SOLN: 30.0000 mL | Freq: Every day | Status: DC | PRN

## 2017-04-16 MED ORDER — WARFARIN - PHARMACIST DOSING INPATIENT
Freq: Every day | Status: DC
  Administered 2017-04-17 – 2017-04-24 (×4)

## 2017-04-16 MED ORDER — WARFARIN SODIUM 2.5 MG PO TABS: 2.5000 mg | ORAL_TABLET | Freq: Once | ORAL | Status: DC

## 2017-04-16 MED ORDER — METOPROLOL TARTRATE 50 MG PO TABS
50.0000 mg | ORAL_TABLET | Freq: Two times a day (BID) | ORAL | Status: DC
  Administered 2017-04-16 – 2017-04-27 (×22): 50 mg via ORAL
  Filled 2017-04-16 (×22): qty 1

## 2017-04-16 MED ORDER — TRAMADOL HCL 50 MG PO TABS: 100.0000 mg | ORAL_TABLET | Freq: Two times a day (BID) | ORAL | Status: DC | PRN

## 2017-04-16 MED ORDER — LORATADINE 10 MG PO TABS
10.0000 mg | ORAL_TABLET | Freq: Every day | ORAL | Status: DC
  Administered 2017-04-17 – 2017-04-27 (×11): 10 mg via ORAL
  Filled 2017-04-16 (×11): qty 1

## 2017-04-16 MED ORDER — POTASSIUM CHLORIDE CRYS ER 20 MEQ PO TBCR
40.0000 meq | EXTENDED_RELEASE_TABLET | Freq: Two times a day (BID) | ORAL | Status: DC
  Administered 2017-04-16 – 2017-04-20 (×9): 40 meq via ORAL
  Administered 2017-04-21: 20 meq via ORAL
  Administered 2017-04-21: 40 meq via ORAL
  Filled 2017-04-16 (×11): qty 2

## 2017-04-16 MED ORDER — HYDROCORTISONE 1 % EX CREA
TOPICAL_CREAM | Freq: Two times a day (BID) | CUTANEOUS | 0 refills | Status: DC | PRN
Start: 2017-04-16 — End: 2017-04-27

## 2017-04-16 MED ORDER — MUPIROCIN CALCIUM 2 % EX CREA: TOPICAL_CREAM | Freq: Two times a day (BID) | CUTANEOUS | 0 refills | Status: DC

## 2017-04-16 MED ORDER — ALLOPURINOL 100 MG PO TABS
100.0000 mg | ORAL_TABLET | Freq: Every day | ORAL | Status: DC
  Administered 2017-04-17 – 2017-04-27 (×11): 100 mg via ORAL
  Filled 2017-04-16 (×11): qty 1

## 2017-04-16 MED ORDER — MUPIROCIN CALCIUM 2 % EX CREA
TOPICAL_CREAM | Freq: Two times a day (BID) | CUTANEOUS | Status: DC
  Administered 2017-04-17 – 2017-04-21 (×9): via TOPICAL
  Filled 2017-04-16: qty 15

## 2017-04-16 MED ORDER — WARFARIN SODIUM 2.5 MG PO TABS: 2.5000 mg | ORAL_TABLET | Freq: Every day | ORAL | Status: DC

## 2017-04-16 MED ORDER — POLYVINYL ALCOHOL 1.4 % OP SOLN
1.0000 [drp] | OPHTHALMIC | Status: DC | PRN
  Filled 2017-04-16: qty 15

## 2017-04-16 MED ORDER — GENTAMICIN SULFATE 0.1 % EX CREA: 1.0000 "application " | TOPICAL_CREAM | Freq: Every day | CUTANEOUS | 0 refills | Status: DC

## 2017-04-16 MED ORDER — DELFLEX-LC/2.5% DEXTROSE 394 MOSM/L IP SOLN: INTRAPERITONEAL | Status: DC

## 2017-04-16 MED ORDER — LACTULOSE 10 GM/15ML PO SOLN
20.0000 g | Freq: Three times a day (TID) | ORAL | Status: DC
  Administered 2017-04-16 – 2017-04-19 (×9): 20 g via ORAL
  Filled 2017-04-16 (×11): qty 30

## 2017-04-16 MED ORDER — ONDANSETRON HCL 4 MG/2ML IJ SOLN: 4.0000 mg | Freq: Four times a day (QID) | INTRAMUSCULAR | Status: DC | PRN

## 2017-04-16 MED ORDER — WARFARIN SODIUM 2.5 MG PO TABS
2.5000 mg | ORAL_TABLET | Freq: Once | ORAL | Status: DC
  Administered 2017-04-16: 2.5 mg via ORAL
  Filled 2017-04-16: qty 1

## 2017-04-16 MED ORDER — ONDANSETRON HCL 4 MG PO TABS: 4.0000 mg | ORAL_TABLET | Freq: Four times a day (QID) | ORAL | Status: DC | PRN

## 2017-04-16 MED ORDER — HYDROCORTISONE 1 % EX CREA
TOPICAL_CREAM | Freq: Two times a day (BID) | CUTANEOUS | Status: DC | PRN
  Administered 2017-04-19: 20:00:00 via TOPICAL
  Filled 2017-04-16: qty 28

## 2017-04-16 MED ORDER — DIAZEPAM 5 MG PO TABS
10.0000 mg | ORAL_TABLET | Freq: Every day | ORAL | Status: DC | PRN
  Administered 2017-04-17 – 2017-04-19 (×3): 10 mg via ORAL
  Filled 2017-04-16 (×3): qty 2

## 2017-04-16 MED ORDER — DELFLEX-LC/4.25% DEXTROSE 483 MOSM/L IP SOLN: INTRAPERITONEAL | Status: DC

## 2017-04-16 MED ORDER — INSULIN ASPART 100 UNIT/ML ~~LOC~~ SOLN: SUBCUTANEOUS | 11 refills | Status: DC

## 2017-04-16 MED ORDER — TORSEMIDE 20 MG PO TABS
40.0000 mg | ORAL_TABLET | Freq: Every day | ORAL | Status: DC
  Administered 2017-04-17 – 2017-04-27 (×10): 40 mg via ORAL
  Filled 2017-04-16 (×11): qty 2

## 2017-04-16 NOTE — Progress Notes (Signed)
Physical Therapy Treatment Patient Details Name: [name] NOLT, MD MRN: [id number] DOB: 09/12/1941 Today's Date: 04/16/2017    History of Present Illness Pt is a 76 yo male admitted through ED on 04/08/17 with AMS resulting from hepatic encephalopathy and ascites. Pt was started on peritoneal dialysis and is planning on continuing this when he returns home. PMH significant for ischemic heart disease, CABG, DM, HTN, A-Fib, cardiac pacemaker, CHF.     PT Comments    Continuing work on functional mobility and activity tolerance;  Able to take steps today and stand to hug his wife; Steady progress -- Overall progressing well; Anticipate continuing good progress at post-acute rehabilitation.    Follow Up Recommendations  CIR;Supervision/Assistance - 24 hour     Equipment Recommendations  Other (comment) (defer tp CIR)    Recommendations for Other Services       Precautions / Restrictions Precautions Precautions: Fall Precaution Comments: significant scrotal swelling Restrictions Weight Bearing Restrictions: No    Mobility  Bed Mobility Overal bed mobility: Needs Assistance Bed Mobility: Rolling;Sidelying to Sit Rolling: +2 for safety/equipment;Min assist Sidelying to sit: Mod assist;+2 for safety/equipment       General bed mobility comments: Cues for technqiue and use of rail; Light mod assist to elevate tunk to sitting position  Transfers Overall transfer level: Needs assistance Equipment used: Rolling walker (2 wheeled) Transfers: Sit to/from Stand Sit to Stand: Mod assist;+2 physical assistance         General transfer comment: Cues for tecnqiue, and to initiate with anterior weight shift; mod assist to power up and steady at initial stand due to posterior lean  Ambulation/Gait Ambulation/Gait assistance: Min assist;+2 safety/equipment Ambulation Distance (Feet): 5 Feet Assistive device: Rolling walker (2 wheeled) Gait Pattern/deviations: Step-through  pattern;Decreased step length - right;Decreased step length - left;Decreased stride length   Gait velocity interpretation: Below normal speed for age/gender General Gait Details: Cues to self-monitor for activity tolerance; small, slow stepsq   Stairs            Wheelchair Mobility    Modified Rankin (Stroke Patients Only)       Balance     Sitting balance-Leahy Scale: Fair       Standing balance-Leahy Scale: Poor (approaching Fair)                              Cognition Arousal/Alertness: Awake/alert Behavior During Therapy: WFL for tasks assessed/performed Overall Cognitive Status: Within Functional Limits for tasks assessed                                        Exercises      General Comments        Pertinent Vitals/Pain Pain Assessment: 0-10 Pain Score: 2  Pain Location: abdomen and scrotum at beginning and end of session Pain Descriptors / Indicators: Grimacing;Guarding;Sore Pain Intervention(s): Monitored during session (pillow case to support scrotum)    Home Living                      Prior Function            PT Goals (current goals can now be found in the care plan section) Acute Rehab PT Goals Patient Stated Goal: to get rid of his swelling; hopeful for CIR PT Goal Formulation: With patient/family Time For Goal  Achievement: 04/19/17 Potential to Achieve Goals: Good Progress towards PT goals: Progressing toward goals    Frequency    Min 3X/week      PT Plan Current plan remains appropriate    Co-evaluation             End of Session Equipment Utilized During Treatment: Gait belt Activity Tolerance: Patient tolerated treatment well Patient left: in chair;with call bell/phone within reach;with family/visitor present Nurse Communication: Mobility status;Need for lift equipment PT Visit Diagnosis: Other abnormalities of gait and mobility (R26.89);Muscle weakness (generalized)  (M62.81);Pain Pain - part of body:  (abdomen)     Time: 4235-3614 PT Time Calculation (min) (ACUTE ONLY): 22 min  Charges:  $Gait Training: 8-22 mins                    G Codes:       Roney Marion, PT  Acute Rehabilitation Services Pager [phone] Office [place] 04/16/2017, 12:05 PM

## 2017-04-16 NOTE — Progress Notes (Signed)
ANTICOAGULATION CONSULT NOTE - Follow Up Consult  Pharmacy Consult for warfarin Indication: atrial fibrillation  Patient Measurements: Height: 5\' 11"  (180.3 cm) Weight: 220 lb 0.3 oz (99.8 kg) IBW/kg (Calculated) : 75.3  Vital Signs: Temp: 97.4 F (36.3 C) (04/24 0717) Temp Source: Oral (04/24 0717) BP: 144/62 (04/24 0717) Pulse Rate: 54 (04/24 0717)  Labs:  Recent Labs  04/14/17 0629 04/15/17 0540 04/16/17 0245  HGB  --   --  12.3*  HCT  --   --  37.0*  PLT  --   --  82*  LABPROT 35.1* 30.2* 28.2*  INR 3.40 2.82 2.59  CREATININE 2.53*  --  2.12*    Assessment: 30 YOM with CKD V/new ESRD with peritoneal dialysis initialed on 04/09/17. The patient was on Xarelto 15 mg once daily PTA for anticoagulation however with new ESRD/PD patient was transitioned to warfarin. Last dose of Xarelto was 4/16 in the evening.  INR on admission (4/16) was 2- due to influence of Xarelto. INR stayed stable until 4/19 when it jumped to 3.9. INR peaked at 5.04 on 4/20.  INR now within therapeutic range- today 2.59   Hgb 12.3, plts chronically low (currently 82). No bleeding noted.   Goal of Therapy:  INR 2-3 Monitor platelets by anticoagulation protocol: Yes   Plan:  - Warfarin 2.5mg  x 1 tonight  - Daily INR while inpatient - Follow for s/s bleeding  Mallie Giambra D. Alianna Wurster, PharmD, BCPS Clinical Pharmacist Pager: [phone] 04/16/2017 8:48 AM

## 2017-04-16 NOTE — Discharge Summary (Signed)
Physician Discharge Summary  [name], MD WVP:[id number] DOB: Apr 23, 1941 DOA: 04/08/2017  PCP: Leotis Pain, DO  Admit date: 04/08/2017 Discharge date: 04/16/2017  Time spent: 25* minutes  Recommendations for Outpatient Follow-up:  1. Patient to be discharged to Inpatient rehab   Discharge Diagnoses:  Active Problems:   Ischemic heart disease   Hx of CABG   Diabetes [facility])   Essential hypertension   Chronic atrial fibrillation (HCC)   Aortic valve replaced   S/P placement of cardiac pacemaker   CHF (congestive heart failure) (HCC)   Ascites   Confusion   Acute hepatic encephalopathy   ESRD (end stage renal disease) ([place])   Discharge Condition: Stable  Diet recommendation: Heart healthy diet  Filed Weights   04/15/17 2130 04/16/17 0237 04/16/17 0717  Weight: 101.6 kg (223 lb 15.8 oz) 101.6 kg (223 lb 15.8 oz) 99.8 kg (220 lb 0.3 oz)    History of present illness:  76 y.o.malewith medical history significant of ESRD, CHF combine last EF at 45 % by ECHO 2017, , CAD S/P CABG 2006, A fib on Xarelto,aortic stenosis and tricuspid regurgitation status post bioprosthetic AVR 2012, Liver Failure who presents complaining of abdominal discomfort that started overnight. He underwent umbilical hernia repair and peritoneal catheter placement 4-13 by Dr Raul Del. Patient had abdominal binder in place. Patient also notice swelling of scrotum and penis. ED physician discussed with Dr Raul Del, it is ok to remove binder. Patient was mildly confuse, notice to have swelling scrotum and penis. Sodium at 129, BUN 133, Cr 4.3, ammonia 79, Bili 1.6, Hb at 12, Platelet 72, INR 2.0   Hospital Course:  1. Acute hepatic encephalopathy-Resolved, patient came with confusion ammonia was 79 on admission. Started on lactulose. repeat ammonia was 97 Continue lactulose 20 g 3 times a day. Mental status is clear. Ammonia level on 04/14/17 was   77.  2. ESRD- patient underwent peritoneal catheter  placement. Started on peritoneal dialysis on 04/09/2017. patient is supposed to start PD training as outpatient at [facility]. Patient to get PD while in the hospital 3. Hypokalemia- potassium replaced as per nephrology 4. Scrotal swelling- patient recently underwent laparoscopically assisted obliquity repair and peritoneal catheter placement at [facility]. He was seen by surgery for scrotal swelling, the swelling likely from edema and no active bleeding. Gen. surgery recommends patient to be followed up by surgeon who did the procedure. no changes. Continue peritoneal dialysis, as per nephrology. 5. Abdominal pain - resolved, he denies pain this morning. His pain resolved after he had large bowel movement. 6. Atrial fibrillation- continue metoprolol 50 mg by mouth twice a day, Xarelto was changed to warfarin as it was not recommended for ESRD patient.  Continue warfarin per pharmacy consultation. INR is 2.59. 7. Left hand wound- patient had left hand wound caused by dog scratch, started on Bactroban cream topically on left hand. Stable 8. Chronic combined systolic and diastolic CHF- appears compensated. Started on peritoneal dialysis on 04/09/2017 as above.  9. Congestive hepatopathy- patient gets paracentesis, last paracentesis was on 03/06/2017. Also had 2 L removed during recent surgery on 04/05/2017. Continue lactulose. When necessary paracentesis.    Procedures:  None   Consultations:  Nephrology   Discharge Exam: Vitals:   04/16/17 0717 04/16/17 0931  BP: (!) 144/62 (!) 145/65  Pulse: (!) 54 (!) 58  Resp: 16 17  Temp: 97.4 F (36.3 C) 97.6 F (36.4 C)    General: Appears in no acute distress Cardiovascular: RRR,  S1S2 Respiratory: Clear bilaterally Abdomen- soft, non tender, no organomegaly  Discharge Instructions   Discharge Instructions    Diet - low sodium heart healthy    Complete by:  As directed    Increase activity slowly    Complete by:  As directed       Current Discharge Medication List    START taking these medications   Details  gentamicin cream (GARAMYCIN) 0.1 % Apply 1 application topically daily. Qty: 15 g, Refills: 0    hydrocortisone cream 1 % Apply topically 2 (two) times daily as needed for itching. Qty: 30 g, Refills: 0    insulin aspart (NOVOLOG) 100 UNIT/ML injection Sliding scale insulin Less than 70 initiate hypoglycemia protocol 70-120  0 units 120-150 1 unit 151-200 2 units 201-250 3 units 251-300 5 units 301-350 7 units 351-400 9 units  Greater than 400 call MD Qty: 10 mL, Refills: 11    mupirocin cream (BACTROBAN) 2 % Apply topically 2 (two) times daily. Qty: 15 g, Refills: 0    warfarin (COUMADIN) 2.5 MG tablet Take 1 tablet (2.5 mg total) by mouth daily at 6 PM.      CONTINUE these medications which have NOT CHANGED   Details  acetaminophen (TYLENOL) 500 MG tablet Take 1,000 mg by mouth 2 (two) times daily as needed (pain).     allopurinol (ZYLOPRIM) 100 MG tablet Take 200 mg by mouth daily with breakfast.     cetirizine (ZYRTEC) 10 MG tablet Take 10 mg by mouth daily as needed for allergies.     diazepam (VALIUM) 10 MG tablet Take 10 mg by mouth daily as needed (ankle and leg cramps).  Refills: 0    dicyclomine (BENTYL) 10 MG capsule Take 10 mg by mouth every morning.     diphenoxylate-atropine (LOMOTIL) 2.5-0.025 MG per tablet Take 2 tablets by mouth 2 (two) times daily as needed for diarrhea or loose stools. stomach Refills: 1    lactulose (CHRONULAC) 10 GM/15ML solution Take 20 g by mouth 3 (three) times daily as needed for constipation.    metoprolol (LOPRESSOR) 50 MG tablet Take 1 tablet (50 mg total) by mouth 2 (two) times daily. Qty: 60 tablet, Refills: 11    potassium chloride SA (K-DUR,KLOR-CON) 20 MEQ tablet Take 20 mEq by mouth every Saturday.    sucroferric oxyhydroxide (VELPHORO) 500 MG chewable tablet Chew 500 mg by mouth 3 (three) times daily as needed (high phosphorus).      terazosin (HYTRIN) 5 MG capsule Take 1 capsule (5 mg total) by mouth every morning. Qty: 30 capsule, Refills: 6    Testosterone Cypionate 200 MG/ML KIT Inject 1 mL into the muscle every 14 (fourteen) days.    torsemide (DEMADEX) 20 MG tablet Take 40 mg by mouth every morning.     traMADol (ULTRAM) 50 MG tablet Take 50 mg by mouth every 6 (six) hours as needed for pain.    zolpidem (AMBIEN) 10 MG tablet Take 10 mg by mouth at bedtime as needed for sleep.       STOP taking these medications     insulin glargine (LANTUS) 100 unit/mL SOPN      metolazone (ZAROXOLYN) 2.5 MG tablet      Rivaroxaban (XARELTO) 15 MG TABS tablet        Allergies  Allergen Reactions  . Actos [Pioglitazone Hydrochloride] Swelling and Other (See Comments)    Severe peripheral edema  . Codeine Other (See Comments)    Makes Pt aggressive  .  Depakote [Divalproex Sodium] Diarrhea    severe  . Diltiazem Other (See Comments)    lethargy and dyspnea  . Hydralazine Other (See Comments)    Severe chills and SOB   . Isosorbide Dinitrate Other (See Comments)    Severe chills and SOB   . Januvia [Sitagliptin] Diarrhea and Other (See Comments)    bradycardia  . Loop Diuretics Other (See Comments)    Spike in BUN and creatine levels  . Losartan Other (See Comments)    aphasia  . Nsaids Other (See Comments)    Renal problems  . Onglyza [Saxagliptin] Diarrhea and Other (See Comments)    bradycardia  . Orudis [Ketoprofen] Other (See Comments)    Cannot take due to renal insufficiency  . Penicillins Swelling and Other (See Comments)    Serum sickness Has patient had a PCN reaction causing immediate rash, facial/tongue/throat swelling, SOB or lightheadedness with hypotension: Yes Has patient had a PCN reaction causing severe rash involving mucus membranes or skin necrosis: No Has patient had a PCN reaction that required hospitalization Yes Has patient had a PCN reaction occurring within the last 10 years:  No If all of the above answers are "NO", then may proceed with Cephalosporin use.   [name] Potassium Iodide Itching and Rash    Severe entire body itching and rash  . Rozerem [Ramelteon] Diarrhea    Severe   . Amiodarone Other (See Comments)    diarrhea  . Latex Swelling  . Verapamil Other (See Comments)    Severe fatigue  . Benazepril Hcl Other (See Comments)    ? Possible lowers platelets  . Indomethacin Other (See Comments)    Renal problems   . Minocycline Other (See Comments)    Makes dizzy and felt just lousy.  [name] Pentazocine Lactate Other (See Comments)    Unknown allergic reaction - pt and wife do not recall this  . Poison Ivy Extract [Poison Ivy Extract] Rash and Other (See Comments)    blisters  . Sertraline Hcl Other (See Comments)    Pt and wife do not recall this  . Shellfish Allergy Rash      The results of significant diagnostics from this hospitalization (including imaging, microbiology, ancillary and laboratory) are listed below for reference.    Significant Diagnostic Studies: No results found.  Microbiology: No results found for this or any previous visit (from the past 240 hour(s)).   Labs: Basic Metabolic Panel:  Recent Labs Lab 04/10/17 0900 04/11/17 0946 04/13/17 0310 04/14/17 0629 04/16/17 0245  NA 131* 131* 133* 132* 136  K 3.3* 3.3* 2.8* 2.9* 3.4*  CL 92* 93* 94* 94* 98*  CO2 25 28 28 29 27   GLUCOSE 131* 156* 190* 132* 219*  BUN 99* 92* 82* 73* 67*  CREATININE 3.48* 3.03* 2.63* 2.53* 2.12*  CALCIUM 8.7* 8.6* 8.5* 8.3* 8.4*  PHOS 4.5 4.4  --   --  3.1   Liver Function Tests:  Recent Labs Lab 04/10/17 0900 04/11/17 0946 04/16/17 0245  AST  --   --  23  ALT  --   --  10*  ALKPHOS  --   --  95  BILITOT  --   --  1.9*  PROT  --   --  5.4*  ALBUMIN 2.7* 2.5* 2.4*   No results for input(s): LIPASE, AMYLASE in the last 168 hours.  Recent Labs Lab 04/10/17 0444 04/11/17 0408 04/12/17 0435 04/14/17 1443 04/16/17 0245  AMMONIA  66* 47* 97* 77*  81*   CBC:  Recent Labs Lab 04/12/17 0435 04/16/17 0245  WBC 5.9 5.9  HGB 12.5* 12.3*  HCT 36.2* 37.0*  MCV 91.9 93.7  PLT 76* 82*     Signed:  Shatara Stanek S MD.  [facility] 04/16/2017, 10:50 AM

## 2017-04-16 NOTE — Progress Notes (Signed)
Cristina Gong, RN Rehab Admission Coordinator Signed Physical Medicine and Rehabilitation  PMR Pre-admission Date of Service: 04/16/2017 11:29 AM  Related encounter: ED to Hosp-Admission (Current) from 04/08/2017 in [place] MEDICAL/RENAL       [] Hide copied text PMR Admission Coordinator Pre-Admission Assessment  Patient: [name] EGERTON, [name] is an 76 y.o., male MRN: [id number] DOB: [birth year] Height: 5\' 11"  (180.3 cm) Weight: 99.8 kg (220 lb 0.3 oz)                                                                                                                                                  Insurance Information HMO:     PPO:      PCP:      IPA:      80/20: yes     OTHER:  No HMO PRIMARY: Medicare a and b      Policy#: [id number]      Subscriber: pt Benefits:  Phone #: Passport one online     Name: 04/15/2017 Eff. Date: 08/24/2001     Deduct: $1340      Out of Pocket Max: none      Life Max: none CIR: 100%      SNF: 20 full days Outpatient: 80%    Co-Pay: 20% Home Health: 100%      Co-Pay: none DME: 80%     Co-Pay: 20% Providers: pt choice  SECONDARY: [employer] [place] supplemental      Policy#: [id number]      Subscriber: pt  Medicaid Application Date:       Case Manager:  Disability Application Date:       Case Worker:   Emergency Contact Information        Contact Information    Name Relation Home Work [place] Spouse [phone]  [phone]   Bott,H.R. 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  Patient with recent umbilical hernia repair 74/12/8786VE well as placement of peritoneal dialysis catheterper Dr.Tepparafor anticipation of CPPDno hemodialysis due to advanced congestive heart failure at [facility].  Presented 04/08/2017 with altered mental status, scrotaledema and abdominal discomfort. No chest pain. Sodium 129, BUN 133, creatinine 4.3, ammonia 79. Started on lactulose. Noted recent paracentesis 03/06/2017 for hepatopathywith 2 L yield CCPDongoing.  Follow-up Gen. surgery for scrotal edema no active bleeding with conservative care.WOCfollow-up for left dorsal hand woundfrom a dog scratch withskin care as directed. Lactulose adjusted latest ammonia level of 77.Xareltowas not recommended while with dialysis and switched to Coumadin.   Past Medical History      Past Medical History:  Diagnosis Date  . A-fib ([place])    permanent with tachy-brady syndrome  . Anemia   . Anxiety   . Atrial flutter ([facility])    hx of  . Biceps tendon tear    right  . Bone marrow disease   . 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 CHF (congestive heart failure) ([place])   . Chronic kidney disease (CKD), stage III (moderate)    lov note dr Koleen Nimrod nephrology 09-17-13 on chart  . Complication of anesthesia 11-16-13   required alot of versed per anesthesia with cataract surgery  . Coronary heart disease    stent, CABG, RBBB  . DDD (degenerative disc disease)   . Diabetes ([place])   . Diabetes mellitus   . Fungal toenail infection   . Gout   . Hemorrhagic cystitis 2012  . Hepatitis    mono  . HTN (hypertension)   . Hypercalcemia 2011   due to sarcoidiosis  . Myocardial infarction [facility])    1995, 2002, 2006  . OSA (obstructive sleep apnea)     use bipap setting of 10 and 12  . Osteoarthritis   . Pneumonia 1966, 2011   hx of  . Presence of permanent cardiac pacemaker   . Right sciatic nerve pain    for block thursday 01-21-2014, injections  . Sarcoid 2011   pulmonary and bone marrow  . Shortness of breath dyspnea   . Synovial cyst of lumbar spine    l3-l4, l4-l5, injections  . Valvular heart disease    aortic stenosis/regurgitation    Family History  family history includes Heart attack in his father; Stroke in his father and mother.  Prior Rehab/Hospitalizations:  Has the patient had major surgery during 100 days prior to admission? Yes After knee replacement pt was at [facility] for only two days.  Current Medications   Current Facility-Administered Medications:  .  0.9 %  sodium chloride infusion, 250 mL, Intravenous, PRN, Belkys A Regalado, [name] .  acetaminophen (TYLENOL) tablet 1,000 mg, 1,000 mg, Oral, BID PRN, Belkys A Regalado, [name], 1,000 mg at 04/14/17 1958 .  allopurinol (ZYLOPRIM) tablet 100 mg, 100 mg, Oral, Q breakfast, Madelon Lips, [name], 100 mg at 04/16/17 0917 .  dialysis solution 2.5% low-MG/low-CA dianeal solution, , Intraperitoneal, Q24H, Madelon Lips, [name], 5,000 mL at 04/14/17 2000 .  dialysis solution 4.25% low-MG/low-CA dianeal solution, , Intraperitoneal, Q24H, Madelon Lips, [name], 5,000 mL at 04/14/17 2001 .  diazepam (VALIUM) tablet 10 mg, 10 mg, Oral, Daily PRN, Belkys A Regalado, [name], 10 mg at 04/15/17 2104 .  diphenhydrAMINE (BENADRYL) capsule 50 mg, 50 mg, Oral, Once, Jeryl Columbia, NP .  gentamicin cream (GARAMYCIN) 0.1 % 1 application, 1 application, Topical, Daily, Madelon Lips, [name], 1 application at 27/25/36 1139 .  heparin 2,500 Units in dialysis solution 2.5% low-MG/low-CA 5,000 mL dialysis solution, , Peritoneal Dialysis, PRN, Madelon Lips, [name] .  hydrocortisone cream 1 %, , Topical, BID PRN, Oswald Hillock, [name] .  lactulose (CHRONULAC) 10 GM/15ML solution 20  g, 20 g, Oral, TID, Belkys A Regalado, [name], 20 g at 04/16/17 1059 .  loratadine (CLARITIN) tablet 10 mg, 10 mg, Oral, Daily,  Belkys A Regalado, [name], 10 mg at 04/16/17 1059 .  metoprolol (LOPRESSOR) tablet 50 mg, 50 mg, Oral, BID, Belkys A Regalado, [name], 50 mg at 04/16/17 1059 .  mupirocin cream (BACTROBAN) 2 %, , Topical, BID, Kathe Mariner, RN .  ondansetron (ZOFRAN) tablet 4 mg, 4 mg, Oral, Q6H PRN **OR** ondansetron (ZOFRAN) injection 4 mg, 4 mg, Intravenous, Q6H PRN, Belkys A Regalado, [name] .  polyvinyl alcohol (LIQUIFILM TEARS) 1.4 % ophthalmic solution 1 drop, 1 drop, Both Eyes, PRN, Oswald Hillock, [name] .  potassium chloride SA (K-DUR,KLOR-CON) CR tablet 40 mEq, 40 mEq, Oral, BID, Madelon Lips, [name], 40 mEq at 04/16/17 1101 .  sodium chloride flush (NS) 0.9 % injection 3 mL, 3 mL, Intravenous, Q12H, Belkys A Regalado, [name], 3 mL at 04/16/17 1102 .  sodium chloride flush (NS) 0.9 % injection 3 mL, 3 mL, Intravenous, PRN, Belkys A Regalado, [name] .  terazosin (HYTRIN) capsule 5 mg, 5 mg, Oral, BH-q7a, Belkys A Regalado, [name], 5 mg at 04/16/17 0917 .  torsemide (DEMADEX) tablet 40 mg, 40 mg, Oral, Daily, Madelon Lips, [name], 40 mg at 04/16/17 1056 .  traMADol (ULTRAM) tablet 100 mg, 100 mg, Oral, Q12H PRN, Oswald Hillock, [name] .  warfarin (COUMADIN) tablet 2.5 mg, 2.5 mg, Oral, ONCE-1800, Lauren D Bajbus, RPH .  Warfarin - Pharmacist Dosing Inpatient, , Does not apply, q1800, Rolla Flatten, [facility], Stopped at 04/11/17 1800 .  zolpidem (AMBIEN) tablet 5 mg, 5 mg, Oral, QHS PRN, Belkys A Regalado, [name], 5 mg at 04/10/17 2133  Patients Current Diet: Diet 2 gram sodium Room service appropriate? Yes; Fluid consistency: Thin; Fluid restriction: 1200 mL Fluid Diet - low sodium heart healthy  Precautions / Restrictions Precautions Precautions: Fall Precaution Comments: significant scrotal swelling Restrictions Weight Bearing Restrictions: No   Has the patient had 2 or more falls or a fall with injury in  the past year?No  Prior Activity Level Limited Community (1-2x/wk): was Mod I on RW prior to most recent surgery at [facility] fro hernia repair and PD catheter placement  Home Assistive Devices / Equipment Home Assistive Devices/Equipment: CPAP, CBG Meter, Blood pressure cuff, Eyeglasses, Other (Comment) (binder) Home Equipment: Walker - 2 wheels, Grab bars - tub/shower, Grab bars - toilet, Toilet riser, Bedside commode, Wheelchair - manual, Other (comment)  Prior Device Use: Indicate devices/aids used by the patient prior to current illness, exacerbation or injury? Walker  Prior Functional Level Prior Function Level of Independence: Independent Comments: Pt was able to perform ADLs prior to surgery on 04/05/17  Self Care: Did the patient need help bathing, dressing, using the toilet or eating?  Independent prior to hernia repair  Indoor Mobility: Did the patient need assistance with walking from room to room (with or without device)? Independent  Stairs: Did the patient need assistance with internal or external stairs (with or without device)? Needed some help  Functional Cognition: Did the patient need help planning regular tasks such as shopping or remembering to take medications? Needed some help  Current Functional Level Cognition  Overall Cognitive Status: Within Functional Limits for tasks assessed Orientation Level: Oriented X4    Extremity Assessment (includes Sensation/Coordination)  Upper Extremity Assessment: Generalized weakness  Lower Extremity Assessment: Generalized weakness    ADLs  Overall ADL's : Needs assistance/impaired Grooming: Oral care, Minimal assistance, Moderate assistance, Sitting Grooming Details (indicate cue type and reason): Pt performed groomign at EOB. Presents with posterior lean when performign tasks at EOB and required  Min-Mod A. Pt demonstrates good correction of posture with VCs Upper Body Bathing: Sitting, Moderate  assistance Lower Body Bathing: Maximal assistance, Sit to/from stand Upper Body Dressing : Maximal assistance, Sitting Lower Body Dressing: Maximal assistance, Sit to/from stand Lower Body Dressing Details (indicate cue type and reason): Donned pt's socks for him due to pain with lean forwards becasue of scrodal edema Toilet Transfer: Maximal assistance, Stand-pivot (Simulated to reliner with Clarise Cruz stedy) Toilet Transfer Details (indicate cue type and reason): Wife A with using urinal due to swelling  Toileting- Clothing Manipulation and Hygiene: Maximal assistance Toileting - Clothing Manipulation Details (indicate cue type and reason): Wife A with using urinal due to swelling     Mobility  Overal bed mobility: Needs Assistance Bed Mobility: Rolling, Sidelying to Sit Rolling: +2 for safety/equipment, Min assist Sidelying to sit: Mod assist, +2 for safety/equipment Sit to supine: Max assist, +2 for physical assistance General bed mobility comments: Cues for technqiue and use of rail; Light mod assist to elevate tunk to sitting position    Transfers  Overall transfer level: Needs assistance Equipment used: Rolling walker (2 wheeled) Transfer via Lift Equipment: Stedy Transfers: Sit to/from Stand Sit to Stand: Mod assist, +2 physical assistance General transfer comment: Cues for tecnqiue, and to initiate with anterior weight shift; mod assist to power up and steady at initial stand due to posterior lean    Ambulation / Gait / Stairs / Wheelchair Mobility  Ambulation/Gait Ambulation/Gait assistance: Min assist, +2 safety/equipment Ambulation Distance (Feet): 5 Feet Assistive device: Rolling walker (2 wheeled) Gait Pattern/deviations: Step-through pattern, Decreased step length - right, Decreased step length - left, Decreased stride length General Gait Details: Cues to self-monitor for activity tolerance; small, slow stepsq Gait velocity interpretation: Below normal speed for  age/gender    Posture / Balance Dynamic Sitting Balance Sitting balance - Comments: Pt performed grooming at EOB and demosntrated good static sitting when not performing tasks but has increased posterior lean, requiring some physical assist/support with tasks Balance Overall balance assessment: Needs assistance Sitting-balance support: No upper extremity supported, Feet supported Sitting balance-Leahy Scale: Fair Sitting balance - Comments: Pt performed grooming at EOB and demosntrated good static sitting when not performing tasks but has increased posterior lean, requiring some physical assist/support with tasks Postural control: Posterior lean Standing balance support: Bilateral upper extremity supported, During functional activity Standing balance-Leahy Scale: Poor (approaching Fair) Standing balance comment: pt required bilateral UE supports on RW and mod-max A x2 to stand for approximately 3 minutes    Special needs/care consideration BiPAP/CPAP Home CPAP pta CPM no Continuous Drip IV  N/a Dialysis new to CCPD this admission. Was to begin home training at [facility] on 04/16/2017 but pt functional mobility declined and pt not able. Dr. Arty Baumgartner is his primary nephrologist Life Vest  N/a Oxygen  N/a Special Bed  N/a Trach Size  N/a Wound Vac (area)  N/a Skin ecchymosis to abdomen; surgical site for hernia repair; PD catheter site with dressing; skin tear to penis; significant scrotal and penile edema with scrotal sling ordered Bowel mgmt:  Continent LBM 04/15/17 Bladder mgmt: continent urinal Diabetic mgmt yes Patient typically receive paracentesis every two months for average of 5 liters. Last paracentesis was during hernia surgery for 2 liters   Previous Home Environment Living Arrangements: Spouse/significant other  Lives With: Spouse Available Help at Discharge: Family, Available 24 hours/day, Friend(s) Type of Home: House Home Layout: Two level, Able to live on main  level with bedroom/bathroom Alternate Level  Stairs-Number of Steps: Stair lift Home Access: Stairs to enter Entrance Stairs-Rails: Left Entrance Stairs-Number of Steps: 2 Bathroom Shower/Tub: Multimedia programmer: Standard Bathroom Accessibility: Yes How Accessible: Accessible via walker Home Care Services: No Additional Comments: attempting to get hospital bed at home.   Discharge Living Setting Plans for Discharge Living Setting: Patient's home, Lives with (comment) (spouse) Type of Home at Discharge: House Discharge Home Layout: Able to live on main level with bedroom/bathroom, Full bath on main level, Two level Alternate Level Stairs-Rails:  (stair lift) Alternate Level Stairs-Number of Steps: flight Discharge Home Access: Stairs to enter Entrance Stairs-Rails: Left Entrance Stairs-Number of Steps: 2 Discharge Bathroom Shower/Tub: Walk-in shower Discharge Bathroom Toilet: Standard Discharge Bathroom Accessibility: Yes How Accessible: Accessible via walker Does the patient have any problems obtaining your medications?: No  Social/Family/Support Systems Patient Roles: Spouse, Parent (retired Internal Medicine [name]) Contact Information: [name], wife Anticipated Caregiver: wife Anticipated Caregiver's Contact Information: see above Caregiver Availability: 24/7 Discharge Plan Discussed with Primary Caregiver: Yes Is Caregiver In Agreement with Plan?: Yes Does Caregiver/Family have Issues with Lodging/Transportation while Pt is in Rehab?: No  Goals/Additional Needs Patient/Family Goal for Rehab: supervision to min assist with PT and OT, supervision to Mod I with SLP Expected length of stay: ELOS 14-18 days Equipment Needs: CCPD to be managed by Hemodialysis staff. Plans to be disconnected by 8 am every morning Special Service Needs: Wife has requested scrotal sling due to extreme edema to scrotum Pt/Family Agrees to Admission and willing to participate: Yes Program  Orientation Provided & Reviewed with Pt/Caregiver Including Roles  & Responsibilities: Yes  Decrease burden of Care through IP rehab admission: n/a  Possible need for SNF placement upon discharge:not anticipated. Unable to d/c to SNF on peritoneal dialysis. Would have to be switched to hemodialysis for SNF placement.  Patient Condition: This patient's medical and functional status has changed since the consult dated: 04/12/2017 in which the Rehabilitation Physician determined and documented that the patient's condition is appropriate for intensive rehabilitative care in an inpatient rehabilitation facility. See "History of Present Illness" (above) for medical update. Functional changes are: overall mod assist overall Patient's medical and functional status update has been discussed with the Rehabilitation physician and patient remains appropriate for inpatient rehabilitation. Will admit to inpatient rehab today.  Preadmission Screen Completed By:  Cleatrice Burke, 04/16/2017 12:25 PM ______________________________________________________________________   Discussed status with Dr. Naaman Plummer on 04/16/2017 at  1152 and received telephone approval for admission today.  Admission Coordinator:  Cleatrice Burke, time 1791 Date 04/16/2017       Cosigned by: Meredith Staggers, [name] at 04/16/2017 1:37 PM  Revision History

## 2017-04-16 NOTE — Progress Notes (Signed)
Patient ID: [name], MD, male   DOB: 1941/09/10, 76 y.o.   MRN: [id number] S:More awake and alert this am but had severe abdominal pain associated with N/V.  O:BP (!) 144/62   Pulse (!) 54   Temp 97.4 F (36.3 C) (Oral)   Resp 16   Ht 5\' 11"  (1.803 m)   Wt 99.8 kg (220 lb 0.3 oz)   SpO2 99%   BMI 30.69 kg/m   Intake/Output Summary (Last 24 hours) at 04/16/17 0846 Last data filed at 04/16/17 0717  Gross per 24 hour  Intake             7998 ml  Output             7912 ml  Net               86 ml   Intake/Output: I/O last 3 completed shifts: In: 3016 [P.O.:720; Other:5034] Out: 1025 [Urine:1025]  Intake/Output this shift:  Total I/O In: 7518 [Other:7518] Out: 7287 [Other:7287] Weight change: -3.4 kg (-7 lb 7.9 oz) WFU:XNATF WM in NAD CVS:no rub II/VI SEM and metallic click Resp:decreased bs TDD:UKGURKYHC, +BS, +abdominal wall edema Ext:2+ edema   Recent Labs Lab 04/10/17 0900 04/11/17 0946 04/13/17 0310 04/14/17 0629 04/16/17 0245  NA 131* 131* 133* 132* 136  K 3.3* 3.3* 2.8* 2.9* 3.4*  CL 92* 93* 94* 94* 98*  CO2 25 28 28 29 27   GLUCOSE 131* 156* 190* 132* 219*  BUN 99* 92* 82* 73* 67*  CREATININE 3.48* 3.03* 2.63* 2.53* 2.12*  ALBUMIN 2.7* 2.5*  --   --  2.4*  CALCIUM 8.7* 8.6* 8.5* 8.3* 8.4*  PHOS 4.5 4.4  --   --  3.1  AST  --   --   --   --  23  ALT  --   --   --   --  10*   Liver Function Tests:  Recent Labs Lab 04/10/17 0900 04/11/17 0946 04/16/17 0245  AST  --   --  23  ALT  --   --  10*  ALKPHOS  --   --  95  BILITOT  --   --  1.9*  PROT  --   --  5.4*  ALBUMIN 2.7* 2.5* 2.4*   No results for input(s): LIPASE, AMYLASE in the last 168 hours.  Recent Labs Lab 04/12/17 0435 04/14/17 1443 04/16/17 0245  AMMONIA 97* 77* 81*   CBC:  Recent Labs Lab 04/12/17 0435 04/16/17 0245  WBC 5.9 5.9  HGB 12.5* 12.3*  HCT 36.2* 37.0*  MCV 91.9 93.7  PLT 76* 82*   Cardiac Enzymes: No results for input(s): CKTOTAL, CKMB,  CKMBINDEX, TROPONINI in the last 168 hours. CBG: No results for input(s): GLUCAP in the last 168 hours.  Iron Studies: No results for input(s): IRON, TIBC, TRANSFERRIN, FERRITIN in the last 72 hours. Studies/Results: No results found. [name] allopurinol  100 mg Oral Q breakfast  . diphenhydrAMINE  50 mg Oral Once  . gentamicin cream  1 application Topical Daily  . lactulose  20 g Oral TID  . loratadine  10 mg Oral Daily  . metoprolol  50 mg Oral BID  . mupirocin cream   Topical BID  . potassium chloride  40 mEq Oral BID  . sodium chloride flush  3 mL Intravenous Q12H  . terazosin  5 mg Oral BH-q7a  . torsemide  40 mg Oral Daily  . Warfarin - Pharmacist Dosing Inpatient  Does not apply q1800    BMET    Component Value Date/Time   NA 136 04/16/2017 0245   K 3.4 (L) 04/16/2017 0245   CL 98 (L) 04/16/2017 0245   CO2 27 04/16/2017 0245   GLUCOSE 219 (H) 04/16/2017 0245   BUN 67 (H) 04/16/2017 0245   CREATININE 2.12 (H) 04/16/2017 0245   CREATININE 2.67 (H) 06/21/2016 1517   CALCIUM 8.4 (L) 04/16/2017 0245   CALCIUM 8.1 (L) 06/16/2011 0300   GFRNONAA 28 (L) 04/16/2017 0245   GFRAA 33 (L) 04/16/2017 0245   CBC    Component Value Date/Time   WBC 5.9 04/16/2017 0245   RBC 3.95 (L) 04/16/2017 0245   HGB 12.3 (L) 04/16/2017 0245   HGB 11.2 (L) 12/02/2007 0935   HCT 37.0 (L) 04/16/2017 0245   HCT 31.9 (L) 12/02/2007 0935   PLT 82 (L) 04/16/2017 0245   PLT 166 12/02/2007 0935   MCV 93.7 04/16/2017 0245   MCV 87.0 12/02/2007 0935   MCH 31.1 04/16/2017 0245   MCHC 33.2 04/16/2017 0245   RDW 16.9 (H) 04/16/2017 0245   RDW 16.6 (H) 12/02/2007 0935   LYMPHSABS 1.1 04/08/2017 0337   LYMPHSABS 0.4 (L) 12/02/2007 0935   MONOABS 0.3 04/08/2017 0337   MONOABS 0.5 12/02/2007 0935   EOSABS 0.1 04/08/2017 0337   EOSABS 0.0 12/02/2007 0935   BASOSABS 0.0 04/08/2017 0337   BASOSABS 0.0 12/02/2007 0935     Assessment/Plan: 1. Right sided CHF with scrotal swelling.  CT scan negative  for hernia or leak 2. ESRD doing well with CCPD.  Will increase volume to 1500 ml to improve clearance and increase fill time to help with abdominal pain/cramping.  I spoke with Dr. Raul Del regarding removing the sutures from the surgery and will wait until early next week due to ascites and hernia repair.  Will increase fill time to 25 minutes as 20 minutes did improve. 3. Anemia: stable 4. CKD-MBD: stable 5. Nutrition: renal diet, low K cont with supplementation and change diet without potassium restriction 6. Hypertension: stable 7. Hepatic encephalopathy- presumably related to right heart failure continue with lactulose 8. Deconditioning- PT/OT and CIR when bed available   Donetta Potts, MD [facility] [phone]

## 2017-04-16 NOTE — Progress Notes (Signed)
Report given to [facility], RN.  All questions answered.  Discharge instructions and medications discussed with patient's wife.  All questions answered.

## 2017-04-16 NOTE — Care Management Important Message (Signed)
Important Message  Patient Details  Name: [name] ENRIQUEZ, MD MRN: [id number] Date of Birth: [birth year]   Medicare Important Message Given:  Yes    Orbie Pyo 04/16/2017, 1:49 PM

## 2017-04-16 NOTE — Progress Notes (Signed)
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 right handed male with history of hepatopathy, end-stage renal disease with peritoneal hemodialysis, diastolic congestive heart failure, CAD with CABG, atrial fibrillation on xarelto, aortic stenosis with AVR 2012. Per chart review patient lives with spouse independent with assistive device prior to admission but has since been essentially bedbound. Patient with recent umbilical hernia repair [id number]/4462 as well as placement of peritoneal dialysis catheter. Presented 04/08/2017 with altered mental status, scrotal edema and abdominal discomfort. No chest pain. Sodium 129, BUN 133, creatinine 4.3, ammonia 79. Started on lactulose. Noted recent paracentesis 03/06/2017 for hepatopathy with 2 L yield Hemodialysis ongoing. Follow-up Gen. surgery for scrotal edema no active bleeding with conservative care.WOC follow-up for left dorsal hand wound skin care as directed. Lactulose adjusted latest ammonia level of 97. Patient's Xarelto was changed to Coumadin in light of his dialysis.   Review of Systems  Constitutional: Negative for chills and fever.  Eyes: Negative for blurred vision and double vision.  Respiratory: Positive for shortness of breath.   Cardiovascular: Positive for palpitations and leg swelling.  Gastrointestinal: Positive for constipation. Negative for nausea and vomiting.  Genitourinary: Negative for flank pain.  Musculoskeletal: Positive for joint pain and myalgias.    Neurological: Positive for weakness. Negative for seizures.  All other systems reviewed and are negative.      Past Medical History:  Diagnosis Date  . A-fib ([place])    permanent with tachy-brady syndrome  . Anemia   . Anxiety   . Atrial flutter ([facility])    hx of  . Biceps tendon tear    right  . Bone marrow disease   . Cancer ([place])    basal cell  . CHF (congestive heart failure) ([place])   . Chronic kidney disease (CKD), stage III (moderate)    lov note dr Koleen Nimrod nephrology 09-17-13 on chart  . Complication of anesthesia 11-16-13   required alot of versed per anesthesia with cataract surgery  . Coronary heart disease    stent, CABG, RBBB  . DDD (degenerative disc disease)   . Diabetes ([place])   . Diabetes mellitus   . Fungal toenail infection   . Gout   . Hemorrhagic cystitis 2012  . Hepatitis    mono  . HTN (hypertension)   . Hypercalcemia 2011   due to sarcoidiosis  . Myocardial infarction [facility])    1995, 2002, 2006  . OSA (obstructive sleep apnea)    use bipap setting of 10 and 12  . Osteoarthritis   . Pneumonia 1966, 2011   hx of  . Presence of permanent cardiac pacemaker   . Right sciatic nerve pain    for block thursday 01-21-2014, injections  . Sarcoid 2011   pulmonary and bone marrow  . Shortness of breath dyspnea   . Synovial cyst of lumbar spine    l3-l4, l4-l5, injections  . Valvular heart disease    aortic stenosis/regurgitation        Past Surgical History:  Procedure Laterality Date  . BASAL CELL CARCINOMA EXCISION  Right 2015   ear  . CARDIAC CATHETERIZATION N/A 12/10/2016   Procedure: Right Heart Cath;  Surgeon: Jolaine Artist, MD;  Location: [place] CV LAB;  Service: Cardiovascular;  Laterality: N/A;  . CARDIOVERSION N/A 12/30/2013   Procedure: CARDIOVERSION;  Surgeon: [name], MD;  Location: [facility] ENDOSCOPY;  Service: Cardiovascular;  Laterality: N/A;  10:49 cardioversion at 120 joules,  then 150 joules, to SB  used  Lido '30mg'$ ,  Propofol 160 mcg  . CARDIOVERSION  2003, 2006, 2012, 2013  . cataract surgery Right 2007  . cataract surgery Left 11-16-13  . COLONOSCOPY N/A 01/29/2014   Procedure: COLONOSCOPY;  Surgeon: Winfield Cunas., MD;  Location: [facility] ENDOS[facility];  Service: Endoscopy;  Laterality: N/A;  amanda//ja  . CORONARY ARTERY BYPASS GRAFT  2006   x 5  . EP IMPLANTABLE DEVICE N/A 05/05/2015   Procedure: Pacemaker Implant;  Surgeon: Thompson Grayer, MD;  Location: [place] CV LAB;  Service: Cardiovascular;  Laterality: N/A;  . FLEXIBLE SIGMOIDOSCOPY N/A 12/07/2015   Procedure: FLEXIBLE SIGMOIDOSCOPY;  Surgeon: Wilford Corner, MD;  Location: [facility] ENDOSCOPY;  Service: Endoscopy;  Laterality: N/A;  Unprepped  . INSERT / REPLACE / REMOVE PACEMAKER  05/05/2015  . PORTACATH PLACEMENT    . portacath removed    . Redo Median sternotomy, extracorporeal cirulation, AVR, Tricuspid valve repair  06/13/2011   AVR(23-mm Edwards pericardial Magna-Ease valve./ TVrepair (34-mm Edwards MC3 annuloplasty ring  . TOTAL KNEE ARTHROPLASTY Right 05/24/2014   Procedure: RIGHT TOTAL KNEE ARTHROPLASTY;  Surgeon: Gearlean Alf, MD;  Location: [facility] ORS;  Service: Orthopedics;  Laterality: Right;  . TRANSURETHRAL RESECTION OF PROSTATE  oct. 2012   TURP  . VASECTOMY  1977        Family History  Problem Relation Age of Onset  . Heart attack Father   . Stroke Father   . Stroke Mother   . Allergies    . Asthma    . Heart disease    . Cancer     Social History:  reports that he quit smoking about 21 years ago. His smoking use included Pipe and Cigars. He quit after 28.00 years of use. He has never used smokeless tobacco. He reports that he does not drink alcohol or use drugs. Allergies:       Allergies  Allergen Reactions  . Actos [Pioglitazone Hydrochloride] Swelling and Other (See Comments)    Severe peripheral edema  . Codeine Other (See Comments)    Makes  Pt aggressive  . Depakote [Divalproex Sodium] Diarrhea    severe  . Diltiazem Other (See Comments)    lethargy and dyspnea  . Hydralazine Other (See Comments)    Severe chills and SOB   . Isosorbide Dinitrate Other (See Comments)    Severe chills and SOB   . Januvia [Sitagliptin] Diarrhea and Other (See Comments)    bradycardia  . Loop Diuretics Other (See Comments)    Spike in BUN and creatine levels  . Losartan Other (See Comments)    aphasia  . Nsaids Other (See Comments)    Renal problems  . Onglyza [Saxagliptin] Diarrhea and Other (See Comments)    bradycardia  . Orudis [Ketoprofen] Other (See Comments)    Cannot take due to renal insufficiency  . Penicillins Swelling and Other (See Comments)    Serum sickness Has patient had a PCN reaction causing immediate rash, facial/tongue/throat swelling, SOB or lightheadedness with hypotension: Yes Has patient had a PCN reaction causing severe rash involving mucus  membranes or skin necrosis: No Has patient had a PCN reaction that required hospitalization Yes Has patient had a PCN reaction occurring within the last 10 years: No If all of the above answers are "NO", then may proceed with Cephalosporin use.   [name] Potassium Iodide Itching and Rash    Severe entire body itching and rash  . Rozerem [Ramelteon] Diarrhea    Severe   . Amiodarone Other (See Comments)    diarrhea  . Latex Swelling  . Verapamil Other (See Comments)    Severe fatigue  . Benazepril Hcl Other (See Comments)    ? Possible lowers platelets  . Indomethacin Other (See Comments)    Renal problems   . Minocycline Other (See Comments)    Makes dizzy and felt just lousy.  [name] Pentazocine Lactate Other (See Comments)    Unknown allergic reaction - pt and wife do not recall this  . Poison Ivy Extract [Poison Ivy Extract] Rash and Other (See Comments)    blisters  . Sertraline Hcl Other (See Comments)    Pt and wife  do not recall this  . Shellfish Allergy Rash         Medications Prior to Admission  Medication Sig Dispense Refill  . acetaminophen (TYLENOL) 500 MG tablet Take 1,000 mg by mouth 2 (two) times daily as needed (pain).     [name] allopurinol (ZYLOPRIM) 100 MG tablet Take 200 mg by mouth daily with breakfast.     . cetirizine (ZYRTEC) 10 MG tablet Take 10 mg by mouth daily as needed for allergies.     . diazepam (VALIUM) 10 MG tablet Take 10 mg by mouth daily as needed (ankle and leg cramps).   0  . dicyclomine (BENTYL) 10 MG capsule Take 10 mg by mouth every morning.     . diphenoxylate-atropine (LOMOTIL) 2.5-0.025 MG per tablet Take 2 tablets by mouth 2 (two) times daily as needed for diarrhea or loose stools. stomach  1  . insulin glargine (LANTUS) 100 unit/mL SOPN Inject 2-4 Units into the skin daily as needed (CBG >120). CBG 120-140 2 units, 141-160 3 units, 161-180 4 units, 181-200 5 units, 201-220 6 units    . lactulose (CHRONULAC) 10 GM/15ML solution Take 20 g by mouth 3 (three) times daily as needed for constipation.    . metolazone (ZAROXOLYN) 2.5 MG tablet TAKE ONE TABLET BY MOUTH ONCE WEEKLY ON FRIDAYS  AS NEEDED WITH 20MEQ POTASSIUM 5 tablet 2  . metoprolol (LOPRESSOR) 50 MG tablet Take 1 tablet (50 mg total) by mouth 2 (two) times daily. 60 tablet 11  . potassium chloride SA (K-DUR,KLOR-CON) 20 MEQ tablet Take 20 mEq by mouth every Saturday.    . Rivaroxaban (XARELTO) 15 MG TABS tablet Take 1 tablet (15 mg total) by mouth daily with supper. 30 tablet 5  . sucroferric oxyhydroxide (VELPHORO) 500 MG chewable tablet Chew 500 mg by mouth 3 (three) times daily as needed (high phosphorus).     . terazosin (HYTRIN) 5 MG capsule Take 1 capsule (5 mg total) by mouth every morning. 30 capsule 6  . Testosterone Cypionate 200 MG/ML KIT Inject 1 mL into the muscle every 14 (fourteen) days.    [name] torsemide (DEMADEX) 20 MG tablet Take 40 mg by mouth every morning.     . traMADol  (ULTRAM) 50 MG tablet Take 50 mg by mouth every 6 (six) hours as needed for pain.    [name] zolpidem (AMBIEN) 10 MG tablet Take 10 mg by  mouth at bedtime as needed for sleep.       Home: Home Living Family/patient expects to be discharged to:: Private residence Living Arrangements: Spouse/significant other Available Help at Discharge: Family, Available 24 hours/day, Friend(s) Type of Home: House Home Access: Stairs to enter [employer] of Steps: 2 Entrance Stairs-Rails: Left Home Layout: Two level, Able to live on main level with bedroom/bathroom Bathroom Shower/Tub: Walk-in shower Home Equipment: Environmental consultant - 2 wheels, Grab bars - tub/shower, Grab bars - toilet, Toilet riser, Bedside commode, Wheelchair - manual, Other (comment) (hoyer lift) Additional Comments: attempting to get hospital bed at home.   Functional History: Prior Function Level of Independence: Independent with assistive device(s) Comments: was able to perform short distance gait with RW prior to admission. Became bedbound just prior to hospitalization Functional Status:  Mobility: Bed Mobility Overal bed mobility: Needs Assistance Bed Mobility: Sit to Supine Sit to supine: Max assist, +2 for physical assistance General bed mobility comments: Max A for trunk and Le's into bed. Min A to move up in bed with use of UE's on railing Transfers Overall transfer level: Needs assistance Equipment used: Ambulation equipment used Transfer via Lift Equipment: Stedy Transfers: Sit to/from Stand Sit to Stand: +2 physical assistance, +2 safety/equipment, Max assist General transfer comment: Max A to stand from recliner and pull up onto STEDY. pt is able to stand on steady for hygiene before sitting in bed.  Ambulation/Gait General Gait Details: unable to perform this session  ADL:  Cognition: Cognition Overall Cognitive Status: Within Functional Limits for tasks assessed Orientation Level: Oriented to person,  Oriented to place, Oriented to time, Oriented to situation Cognition Arousal/Alertness: Awake/alert Behavior During Therapy: WFL for tasks assessed/performed Overall Cognitive Status: Within Functional Limits for tasks assessed  Blood pressure (!) 144/57, pulse (!) 53, temperature 98.2 F (36.8 C), temperature source Oral, resp. rate 17, height _0  (1.803 m), weight 102 kg (224 lb 13.9 oz), SpO2 96 %. Physical Exam  HENT:  Head: Normocephalic.  Eyes: EOM are normal.  Neck: Normal range of motion. Neck supple. No thyromegaly present.  Cardiovascular:  Cardiac rate controlled  Respiratory: Effort normal and breath sounds normal.  GI: Soft. Bowel sounds are normal. He exhibits distension.  Neurological:  Lethargic but arousable. Provides Name and age with some delay in processing.STM deficits. Follows simple commands. UE strength grossly 3- to 3/5 prox to 4/5 distally. RUE limited due to biceps tendo injury. LE: 3-/5 HF, 3/5 KE and 4/5 ADF/PF  Skin:  Dry dressing to left hand    Lab Results Last 24 Hours       Results for orders placed or performed during the hospital encounter of 04/08/17 (from the past 24 hour(s))  Protime-INR     Status: Abnormal   Collection Time: 04/12/17  4:35 AM  Result Value Ref Range   Prothrombin Time 48.0 (H) 11.4 - 15.2 seconds   INR 5.02 (HH)   Ammonia     Status: Abnormal   Collection Time: 04/12/17  4:35 AM  Result Value Ref Range   Ammonia 97 (H) 9 - 35 umol/L  CBC     Status: Abnormal   Collection Time: 04/12/17  4:35 AM  Result Value Ref Range   WBC 5.9 4.0 - 10.5 K/uL   RBC 3.94 (L) 4.22 - 5.81 MIL/uL   Hemoglobin 12.5 (L) 13.0 - 17.0 g/dL   HCT 36.2 (L) 39.0 - 52.0 %   MCV 91.9 78.0 - 100.0 fL   MCH 31.7  26.0 - 34.0 pg   MCHC 34.5 30.0 - 36.0 g/dL   RDW 17.0 (H) 11.5 - 15.5 %   Platelets 76 (L) 150 - 400 K/uL     Imaging Results (Last 48 hours)  No results found.    Assessment/Plan: Diagnosis: Weakness and  cognitive deficits secondary to hepatic encephalopathy and multiple medical issues above 1. Does the need for close, 24 hr/day medical supervision in concert with the patient's rehab needs make it unreasonable for this patient to be served in a less intensive setting? Yes 2. Co-Morbidities requiring supervision/potential complications: CHF,Aflutter, CAD, DM, CKDIII, gout 3. Due to bladder management, bowel management, safety, skin/wound care, disease management, medication administration, pain management and patient education, does the patient require 24 hr/day rehab nursing? Yes 4. Does the patient require coordinated care of a physician, rehab nurse, PT (1-2 hrs/day, 5 days/week), OT (1-2 hrs/day, 5 days/week) and SLP (1-2 hrs/day, 5 days/week) to address physical and functional deficits in the context of the above medical diagnosis(es)? Yes Addressing deficits in the following areas: balance, endurance, locomotion, strength, transferring, bowel/bladder control, bathing, dressing, feeding, grooming, toileting, cognition and psychosocial support 5. Can the patient actively participate in an intensive therapy program of at least 3 hrs of therapy per day at least 5 days per week? Yes 6. The potential for patient to make measurable gains while on inpatient rehab is excellent 7. Anticipated functional outcomes upon discharge from inpatient rehab are supervision and min assist  with PT, supervision and min assist with OT, modified independent and supervision with SLP. 8. Estimated rehab length of stay to reach the above functional goals is: 14-18 days 9. Does the patient have adequate social supports and living environment to accommodate these discharge functional goals? Yes 10. Anticipated D/C setting: Home 11. Anticipated post D/C treatments: [place] therapy 12. Overall Rehab/Functional Prognosis: excellent  RECOMMENDATIONS: This patient's condition is appropriate for continued rehabilitative care in the  following setting: CIR Patient has agreed to participate in recommended program. Yes Note that insurance prior authorization may be required for reimbursement for recommended care.  Comment: Rehab Admissions Coordinator to follow up.  Thanks,  Meredith Staggers, MD, Mell[facility] Drown    Cathlyn Parsons., PA-C 04/12/2017    Revision History                        Routing History

## 2017-04-16 NOTE — PMR Pre-admission (Signed)
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 Date: 08/24/2001     Deduct: $1340      Out of Pocket Max: none      Life Max: none CIR: 100%      SNF: 20 full days Outpatient: 80%    Co-Pay: 20% Home Health: 100%      Co-Pay: none DME: 80%     Co-Pay: 20% Providers: pt choice  SECONDARY: [employer] of [place] supplemental      Policy#: [id number]      Subscriber: pt  Medicaid Application Date:       Case Manager:  Disability Application Date:       Case Worker:   Emergency Contact Information Contact Information    Name Relation Home Work [place] Spouse [phone]  [phone]   Sabia,H.R. 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  Presented 04/08/2017 with altered mental status, scrotaledema and abdominal discomfort. No chest pain. Sodium 129, BUN 133, creatinine 4.3, ammonia 79. Started on lactulose. Noted recent paracentesis 03/06/2017 for hepatopathywith 2 L yield CCPD ongoing. Follow-up Gen. surgery for scrotal edema no active bleeding with conservative care.WOCfollow-up for left dorsal hand wound from a dog scratch with skin care as directed. Lactulose adjusted latest ammonia level of 77.Xarelto was not recommended while with dialysis and switched to Coumadin.   Past Medical History  Past Medical History:  Diagnosis Date  . A-fib ([place])    permanent with tachy-brady syndrome  . Anemia   . Anxiety   . Atrial flutter ([facility])    hx of  . Biceps tendon tear    right  . Bone marrow disease   . 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 CHF (congestive heart failure) ([place])   . Chronic kidney disease (CKD), stage III (moderate)    lov note dr Koleen Nimrod nephrology 09-17-13 on chart  . Complication of anesthesia 11-16-13   required alot of versed per anesthesia with cataract surgery  . Coronary heart disease    stent, CABG, RBBB  . DDD (degenerative disc disease)   . Diabetes ([place])   . Diabetes  mellitus   . Fungal toenail infection   . Gout   . Hemorrhagic cystitis 2012  . Hepatitis    mono  . HTN (hypertension)   . Hypercalcemia 2011   due to sarcoidiosis  . Myocardial infarction [facility])    1995, 2002, 2006  . OSA (obstructive sleep apnea)    use bipap setting of 10 and 12  . Osteoarthritis   . Pneumonia 1966, 2011   hx of  . Presence of permanent cardiac pacemaker   . Right sciatic nerve pain    for block thursday 01-21-2014, injections  . Sarcoid 2011   pulmonary and bone marrow  . Shortness of breath dyspnea   . Synovial cyst of lumbar spine    l3-l4, l4-l5, injections  . Valvular heart disease    aortic stenosis/regurgitation    Family History  family history includes Heart attack in his  father; Stroke in his father and mother.  Prior Rehab/Hospitalizations:  Has the patient had major surgery during 100 days prior to admission? Yes After knee replacement pt was at [facility] for only two days.  Current Medications   Current Facility-Administered Medications:  .  0.9 %  sodium chloride infusion, 250 mL, Intravenous, PRN, Belkys A Regalado, MD .  acetaminophen (TYLENOL) tablet 1,000 mg, 1,000 mg, Oral, BID PRN, Belkys A Regalado, MD, 1,000 mg at 04/14/17 1958 .  allopurinol (ZYLOPRIM) tablet 100 mg, 100 mg, Oral, Q breakfast, Madelon Lips, MD, 100 mg at 04/16/17 0917 .  dialysis solution 2.5% low-MG/low-CA dianeal solution, , Intraperitoneal, Q24H, Madelon Lips, MD, 5,000 mL at 04/14/17 2000 .  dialysis solution 4.25% low-MG/low-CA dianeal solution, , Intraperitoneal, Q24H, Madelon Lips, MD, 5,000 mL at 04/14/17 2001 .  diazepam (VALIUM) tablet 10 mg, 10 mg, Oral, Daily PRN, Belkys A Regalado, MD, 10 mg at 04/15/17 2104 .  diphenhydrAMINE (BENADRYL) capsule 50 mg, 50 mg, Oral, Once, Jeryl Columbia, NP .  gentamicin cream (GARAMYCIN) 0.1 % 1 application, 1 application, Topical, Daily, Madelon Lips, MD, 1 application at 62/95/28 1139 .  heparin 2,500 Units in dialysis solution 2.5% low-MG/low-CA 5,000 mL dialysis solution, , Peritoneal Dialysis, PRN, Madelon Lips, MD .  hydrocortisone cream 1 %, , Topical, BID PRN, Oswald Hillock, MD .  lactulose (CHRONULAC) 10 GM/15ML solution 20 g, 20 g, Oral, TID, Belkys A Regalado, MD, 20 g at 04/16/17 1059 .  loratadine (CLARITIN) tablet 10 mg, 10 mg, Oral, Daily, Belkys A Regalado, MD, 10 mg at 04/16/17 1059 .  metoprolol (LOPRESSOR) tablet 50 mg, 50 mg, Oral, BID, Belkys A Regalado, MD, 50 mg at 04/16/17 1059 .  mupirocin cream (BACTROBAN) 2 %, , Topical, BID, Kathe Mariner, RN .  ondansetron (ZOFRAN) tablet 4 mg, 4 mg, Oral, Q6H PRN **OR** ondansetron (ZOFRAN) injection 4 mg, 4 mg, Intravenous, Q6H PRN, Belkys A  Regalado, MD .  polyvinyl alcohol (LIQUIFILM TEARS) 1.4 % ophthalmic solution 1 drop, 1 drop, Both Eyes, PRN, Oswald Hillock, MD .  potassium chloride SA (K-DUR,KLOR-CON) CR tablet 40 mEq, 40 mEq, Oral, BID, Madelon Lips, MD, 40 mEq at 04/16/17 1101 .  sodium chloride flush (NS) 0.9 % injection 3 mL, 3 mL, Intravenous, Q12H, Belkys A Regalado, MD, 3 mL at 04/16/17 1102 .  sodium chloride flush (NS) 0.9 % injection 3 mL, 3 mL, Intravenous, PRN, Belkys A Regalado, MD .  terazosin (HYTRIN) capsule 5 mg, 5 mg, Oral, BH-q7a, Belkys A Regalado, MD, 5 mg at 04/16/17 0917 .  torsemide (DEMADEX)  tablet 40 mg, 40 mg, Oral, Daily, Madelon Lips, MD, 40 mg at 04/16/17 1056 .  traMADol (ULTRAM) tablet 100 mg, 100 mg, Oral, Q12H PRN, Oswald Hillock, MD .  warfarin (COUMADIN) tablet 2.5 mg, 2.5 mg, Oral, ONCE-1800, Lauren D Bajbus, RPH .  Warfarin - Pharmacist Dosing Inpatient, , Does not apply, q1800, Rolla Flatten, [facility], Stopped at 04/11/17 1800 .  zolpidem (AMBIEN) tablet 5 mg, 5 mg, Oral, QHS PRN, Belkys A Regalado, MD, 5 mg at 04/10/17 2133  Patients Current Diet: Diet 2 gram sodium Room service appropriate? Yes; Fluid consistency: Thin; Fluid restriction: 1200 mL Fluid Diet - low sodium heart healthy  Precautions / Restrictions Precautions Precautions: Fall Precaution Comments: significant scrotal swelling Restrictions Weight Bearing Restrictions: No   Has the patient had 2 or more falls or a fall with injury in the past year?No  Prior Activity Level Limited Community (1-2x/wk): was Mod I on RW prior to most recent surgery at [facility] fro hernia repair and PD catheter placement  Home Assistive Devices / Equipment Home Assistive Devices/Equipment: CPAP, CBG Meter, Blood pressure cuff, Eyeglasses, Other (Comment) (binder) Home Equipment: Walker - 2 wheels, Grab bars - tub/shower, Grab bars - toilet, Toilet riser, Bedside commode, Wheelchair - manual, Other (comment)  Prior Device  Use: Indicate devices/aids used by the patient prior to current illness, exacerbation or injury? Walker  Prior Functional Level Prior Function Level of Independence: Independent Comments: Pt was able to perform ADLs prior to surgery on 04/05/17  Self Care: Did the patient need help bathing, dressing, using the toilet or eating?  Independent prior to hernia repair  Indoor Mobility: Did the patient need assistance with walking from room to room (with or without device)? Independent  Stairs: Did the patient need assistance with internal or external stairs (with or without device)? Needed some help  Functional Cognition: Did the patient need help planning regular tasks such as shopping or remembering to take medications? Needed some help  Current Functional Level Cognition  Overall Cognitive Status: Within Functional Limits for tasks assessed Orientation Level: Oriented X4    Extremity Assessment (includes Sensation/Coordination)  Upper Extremity Assessment: Generalized weakness  Lower Extremity Assessment: Generalized weakness    ADLs  Overall ADL's : Needs assistance/impaired Grooming: Oral care, Minimal assistance, Moderate assistance, Sitting Grooming Details (indicate cue type and reason): Pt performed groomign at EOB. Presents with posterior lean when performign tasks at EOB and required Min-Mod A. Pt demonstrates good correction of posture with VCs Upper Body Bathing: Sitting, Moderate assistance Lower Body Bathing: Maximal assistance, Sit to/from stand Upper Body Dressing : Maximal assistance, Sitting Lower Body Dressing: Maximal assistance, Sit to/from stand Lower Body Dressing Details (indicate cue type and reason): Donned pt's socks for him due to pain with lean forwards becasue of scrodal edema Toilet Transfer: Maximal assistance, Stand-pivot (Simulated to reliner with Clarise Cruz stedy) Toilet Transfer Details (indicate cue type and reason): Wife A with using urinal due to  swelling  Toileting- Clothing Manipulation and Hygiene: Maximal assistance Toileting - Clothing Manipulation Details (indicate cue type and reason): Wife A with using urinal due to swelling     Mobility  Overal bed mobility: Needs Assistance Bed Mobility: Rolling, Sidelying to Sit Rolling: +2 for safety/equipment, Min assist Sidelying to sit: Mod assist, +2 for safety/equipment Sit to supine: Max assist, +2 for physical assistance General bed mobility comments: Cues for technqiue and use of rail; Light mod assist to elevate tunk to sitting position  Transfers  Overall transfer level: Needs assistance Equipment used: Rolling walker (2 wheeled) Transfer via Lift Equipment: Stedy Transfers: Sit to/from Stand Sit to Stand: Mod assist, +2 physical assistance General transfer comment: Cues for tecnqiue, and to initiate with anterior weight shift; mod assist to power up and steady at initial stand due to posterior lean    Ambulation / Gait / Stairs / Wheelchair Mobility  Ambulation/Gait Ambulation/Gait assistance: Min assist, +2 safety/equipment Ambulation Distance (Feet): 5 Feet Assistive device: Rolling walker (2 wheeled) Gait Pattern/deviations: Step-through pattern, Decreased step length - right, Decreased step length - left, Decreased stride length General Gait Details: Cues to self-monitor for activity tolerance; small, slow stepsq Gait velocity interpretation: Below normal speed for age/gender    Posture / Balance Dynamic Sitting Balance Sitting balance - Comments: Pt performed grooming at EOB and demosntrated good static sitting when not performing tasks but has increased posterior lean, requiring some physical assist/support with tasks Balance Overall balance assessment: Needs assistance Sitting-balance support: No upper extremity supported, Feet supported Sitting balance-Leahy Scale: Fair Sitting balance - Comments: Pt performed grooming at EOB and demosntrated good static  sitting when not performing tasks but has increased posterior lean, requiring some physical assist/support with tasks Postural control: Posterior lean Standing balance support: Bilateral upper extremity supported, During functional activity Standing balance-Leahy Scale: Poor (approaching Fair) Standing balance comment: pt required bilateral UE supports on RW and mod-max A x2 to stand for approximately 3 minutes    Special needs/care consideration BiPAP/CPAP Home CPAP pta CPM no Continuous Drip IV  N/a Dialysis new to CCPD this admission. Was to begin home training at [facility] on 04/16/2017 but pt functional mobility declined and pt not able. Dr. Arty Baumgartner is his primary nephrologist Life Vest  N/a Oxygen  N/a Special Bed  N/a Trach Size  N/a Wound Vac (area)  N/a Skin ecchymosis to abdomen; surgical site for hernia repair; PD catheter site with dressing; skin tear to penis; significant scrotal and penile edema with scrotal sling ordered Bowel mgmt:  Continent LBM 04/15/17 Bladder mgmt: continent urinal Diabetic mgmt yes Patient typically receive paracentesis every two months for average of 5 liters. Last paracentesis was during hernia surgery for 2 liters   Previous Home Environment Living Arrangements: Spouse/significant other  Lives With: Spouse Available Help at Discharge: Family, Available 24 hours/day, Friend(s) Type of Home: House Home Layout: Two level, Able to live on main level with bedroom/bathroom Alternate Level Stairs-Number of Steps: Stair lift Home Access: Stairs to enter Entrance Stairs-Rails: Left Entrance Stairs-Number of Steps: 2 Bathroom Shower/Tub: Multimedia programmer: Standard Bathroom Accessibility: Yes How Accessible: Accessible via walker Sawyer: No Additional Comments: attempting to get hospital bed at home.   Discharge Living Setting Plans for Discharge Living Setting: Patient's home, Lives with (comment) (spouse) Type of  Home at Discharge: House Discharge Home Layout: Able to live on main level with bedroom/bathroom, Full bath on main level, Two level Alternate Level Stairs-Rails:  (stair lift) Alternate Level Stairs-Number of Steps: flight Discharge Home Access: Stairs to enter Entrance Stairs-Rails: Left Entrance Stairs-Number of Steps: 2 Discharge Bathroom Shower/Tub: Walk-in shower Discharge Bathroom Toilet: Standard Discharge Bathroom Accessibility: Yes How Accessible: Accessible via walker Does the patient have any problems obtaining your medications?: No  Social/Family/Support Systems Patient Roles: Spouse, Parent (retired Internal Medicine MD) Contact Information: [name], wife Anticipated Caregiver: wife Anticipated Caregiver's Contact Information: see above Caregiver Availability: 24/7 Discharge Plan Discussed with Primary Caregiver: Yes Is Caregiver In Agreement with Plan?:  Yes Does Caregiver/Family have Issues with Lodging/Transportation while Pt is in Rehab?: No  Goals/Additional Needs Patient/Family Goal for Rehab: supervision to min assist with PT and OT, supervision to Mod I with SLP Expected length of stay: ELOS 14-18 days Equipment Needs: CCPD to be managed by Hemodialysis staff. Plans to be disconnected by 8 am every morning Special Service Needs: Wife has requested scrotal sling due to extreme edema to scrotum Pt/Family Agrees to Admission and willing to participate: Yes Program Orientation Provided & Reviewed with Pt/Caregiver Including Roles  & Responsibilities: Yes  Decrease burden of Care through IP rehab admission: n/a  Possible need for SNF placement upon discharge:not anticipated. Unable to d/c to SNF on peritoneal dialysis. Would have to be switched to hemodialysis for SNF placement.  Patient Condition: This patient's medical and functional status has changed since the consult dated: 04/12/2017 in which the Rehabilitation Physician determined and documented that the  patient's condition is appropriate for intensive rehabilitative care in an inpatient rehabilitation facility. See "History of Present Illness" (above) for medical update. Functional changes are: overall mod assist overall Patient's medical and functional status update has been discussed with the Rehabilitation physician and patient remains appropriate for inpatient rehabilitation. Will admit to inpatient rehab today.  Preadmission Screen Completed By:  Cleatrice Burke, 04/16/2017 12:25 PM ______________________________________________________________________   Discussed status with Dr. Naaman Plummer on 04/16/2017 at  1152 and received telephone approval for admission today.  Admission Coordinator:  Cleatrice Burke, time 3254 Date 04/16/2017

## 2017-04-16 NOTE — Progress Notes (Signed)
I contacted Dr. Darrick Meigs and we will plan d/c to inpt rehab today. I met with [name] and wife at bedside and they are in agreement. I will make the arrangements to admit today. [phone]

## 2017-04-16 NOTE — H&P (Signed)
Physical Medicine and Rehabilitation Admission H&P     Chief Complaint  Patient presents with  . Post-op Problem  . 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 Per chart review patient lives with spouse independent with assistive device prior to admission but generalized decline over the past few weeks and has been essentially bedbound. 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 Presented 04/08/2017 with altered mental status, scrotal edema and abdominal discomfort. No chest pain. Sodium 129, BUN 133, creatinine 4.3, ammonia 79. Started on lactulose. Noted recent paracentesis 03/06/2017 for hepatopathy with 2 L yield Hemodialysis/CCPD ongoing. Follow-up Gen. surgery for scrotal edema no active bleeding with conservative care.WOC follow-up for left dorsal hand wound from a dog scratch with skin care as directed. Lactulose adjusted latest ammonia level of 77.Xarelto was not recommended while with dialysis and switched to Coumadin. Physical occupational therapy evaluations completed. Patient was admitted for a comprehensive rehabilitation program  Review of Systems  Constitutional: Negative for chills and fever.  HENT: Negative for hearing loss.  Eyes: Negative for blurred vision and double vision.  Respiratory: Positive for shortness of breath. Negative for cough.  Cardiovascular: Positive for palpitations and leg swelling.    Gastrointestinal: Positive for constipation. Negative for nausea and vomiting.  Musculoskeletal: Positive for joint pain.  Skin: Negative for rash.  Neurological: Positive for weakness. Negative for headaches.  Psychiatric/Behavioral:  Anxiety  All other systems reviewed and are negative.       Past Medical History:  Diagnosis Date  . A-fib ([place])    permanent with tachy-brady syndrome  . Anemia   . Anxiety   . Atrial flutter (H[facility])    hx of  . Biceps tendon tear    right  . Bone marrow disease   . Cancer ([place])    basal cell  . CHF (congestive heart failure) ([place])   . Chronic kidney disease (CKD), stage III (moderate)    lov note dr Koleen Nimrod nephrology 09-17-13 on chart  . Complication of anesthesia 11-16-13   required alot of versed per anesthesia with cataract surgery  . Coronary heart disease    stent, CABG, RBBB  . DDD (degenerative disc disease)   . Diabetes ([place])   . Diabetes mellitus   . Fungal toenail infection   . Gout   . Hemorrhagic cystitis 2012  . Hepatitis    mono  . HTN (hypertension)   . Hypercalcemia 2011   due to sarcoidiosis  . Myocardial infarction [facility])    1995, 2002, 2006  . OSA (obstructive sleep apnea)    use bipap setting of 10 and 12  . Osteoarthritis   . Pneumonia 1966, 2011   hx of  . Presence of permanent cardiac pacemaker   . Right sciatic nerve pain    for block thursday 01-21-2014, injections  . Sarcoid 2011   pulmonary and bone marrow  . Shortness of breath dyspnea   . Synovial cyst of lumbar spine    l3-l4, l4-l5, injections  . Valvular heart  disease    aortic stenosis/regurgitation        Past Surgical History:  Procedure Laterality Date  . BASAL CELL CARCINOMA EXCISION Right 2015   ear  . CARDIAC CATHETERIZATION N/A 12/10/2016   Procedure: Right Heart Cath; Surgeon: Jolaine Artist, MD; Location: [facility]; Service: Cardiovascular; Laterality: N/A;  . CARDIOVERSION N/A 12/30/2013   Procedure:  CARDIOVERSION; Surgeon: [name], MD; Location: [facility]OS[facility]; Service: Cardiovascular; Laterality: N/A; 10:49 cardioversion at 120 joules, then 150 joules, to SB used Lido 16m, Propofol 160 mcg  . CARDIOVERSION  2003, 2006, 2012, 2013  . cataract surgery Right 2007  . cataract surgery Left 11-16-13  . COLONOSCOPY N/A 01/29/2014   Procedure: COLONOSCOPY; Surgeon: JWinfield Cunas, MD; [facility]; Service: Endoscopy; Laterality: N/A; amanda//ja  . CORONARY ARTERY BYPASS GRAFT  2006   x 5  . EP IMPLANTABLE DEVICE N/A 05/05/2015   Procedure: Pacemaker Implant; Surgeon: JThompson Grayer MD; [facility]; Service: Cardiovascular; Laterality: N/A;  . FLEXIBLE SIGMOIDOSCOPY N/A 12/07/2015   Procedure: FLEXIBLE SIGMOIDOSCOPY; Surgeon: VWilford Corner MD; [facility]; Service: Endoscopy; Laterality: N/A; Unprepped  . INSERT / REPLACE / REMOVE PACEMAKER  05/05/2015  . PORTACATH PLACEMENT    . portacath removed    . Redo Median sternotomy, extracorporeal cirulation, AVR, Tricuspid valve repair  06/13/2011   AVR(23-mm Edwards pericardial Magna-Ease valve./ TVrepair (34-mm Edwards MC3 annuloplasty ring  . TOTAL KNEE ARTHROPLASTY Right 05/24/2014   Procedure: RIGHT TOTAL KNEE ARTHROPLASTY; Surgeon: FGearlean Alf MD; Location: [facility] ORS; Service: Orthopedics; Laterality: Right;  . TRANSURETHRAL RESECTION OF PROSTATE  oct. 2012   TURP  . VASECTOMY  1977        Family History  Problem Relation Age of Onset  . Heart attack Father   . Stroke Father   . Stroke Mother   . Allergies    . Asthma    . Heart disease    . Cancer     Social History: reports that he quit smoking about 21 years ago. His smoking use included Pipe and Cigars. He quit after 28.00 years of use. He has never used smokeless tobacco. He reports that he does not drink alcohol or use drugs.  Allergies:       Allergies  Allergen Reactions  . Actos [Pioglitazone Hydrochloride] Swelling and  Other (See Comments)    Severe peripheral edema  . Codeine Other (See Comments)    Makes Pt aggressive  . Depakote [Divalproex Sodium] Diarrhea    severe  . Diltiazem Other (See Comments)    lethargy and dyspnea  . Hydralazine Other (See Comments)    Severe chills and SOB   . Isosorbide Dinitrate Other (See Comments)    Severe chills and SOB   . Januvia [Sitagliptin] Diarrhea and Other (See Comments)    bradycardia  . Loop Diuretics Other (See Comments)    Spike in BUN and creatine levels  . Losartan Other (See Comments)    aphasia  . Nsaids Other (See Comments)    Renal problems  . Onglyza [Saxagliptin] Diarrhea and Other (See Comments)    bradycardia  . Orudis [Ketoprofen] Other (See Comments)    Cannot take due to renal insufficiency  . Penicillins Swelling and Other (See Comments)    Serum sickness  Has patient had a PCN reaction causing immediate rash, facial/tongue/throat swelling, SOB or lightheadedness with hypotension: Yes  Has patient had a PCN reaction causing severe rash involving mucus membranes or  skin necrosis: No  Has patient had a PCN reaction that required hospitalization Yes  Has patient had a PCN reaction occurring within the last 10 years: No  If all of the above answers are "NO", then may proceed with Cephalosporin use.   [name] Potassium Iodide Itching and Rash    Severe entire body itching and rash  . Rozerem [Ramelteon] Diarrhea    Severe   . Amiodarone Other (See Comments)    diarrhea  . Latex Swelling  . Verapamil Other (See Comments)    Severe fatigue  . Benazepril Hcl Other (See Comments)    ? Possible lowers platelets  . Indomethacin Other (See Comments)    Renal problems   . Minocycline Other (See Comments)    Makes dizzy and felt just lousy.  [name] Pentazocine Lactate Other (See Comments)    Unknown allergic reaction - pt and wife do not recall this  . Poison Ivy Extract [Poison Ivy Extract] Rash and Other (See Comments)    blisters  .  Sertraline Hcl Other (See Comments)    Pt and wife do not recall this  . Shellfish Allergy Rash         Medications Prior to Admission  Medication Sig Dispense Refill  . acetaminophen (TYLENOL) 500 MG tablet Take 1,000 mg by mouth 2 (two) times daily as needed (pain).     [name] allopurinol (ZYLOPRIM) 100 MG tablet Take 200 mg by mouth daily with breakfast.     . cetirizine (ZYRTEC) 10 MG tablet Take 10 mg by mouth daily as needed for allergies.     . diazepam (VALIUM) 10 MG tablet Take 10 mg by mouth daily as needed (ankle and leg cramps).   0  . dicyclomine (BENTYL) 10 MG capsule Take 10 mg by mouth every morning.     . diphenoxylate-atropine (LOMOTIL) 2.5-0.025 MG per tablet Take 2 tablets by mouth 2 (two) times daily as needed for diarrhea or loose stools. stomach  1  . insulin glargine (LANTUS) 100 unit/mL SOPN Inject 2-4 Units into the skin daily as needed (CBG >120). CBG 120-140 2 units, 141-160 3 units, 161-180 4 units, 181-200 5 units, 201-220 6 units    . lactulose (CHRONULAC) 10 GM/15ML solution Take 20 g by mouth 3 (three) times daily as needed for constipation.    . metolazone (ZAROXOLYN) 2.5 MG tablet TAKE ONE TABLET BY MOUTH ONCE WEEKLY ON FRIDAYS AS NEEDED WITH 20MEQ POTASSIUM 5 tablet 2  . metoprolol (LOPRESSOR) 50 MG tablet Take 1 tablet (50 mg total) by mouth 2 (two) times daily. 60 tablet 11  . potassium chloride SA (K-DUR,KLOR-CON) 20 MEQ tablet Take 20 mEq by mouth every Saturday.    . Rivaroxaban (XARELTO) 15 MG TABS tablet Take 1 tablet (15 mg total) by mouth daily with supper. 30 tablet 5  . sucroferric oxyhydroxide (VELPHORO) 500 MG chewable tablet Chew 500 mg by mouth 3 (three) times daily as needed (high phosphorus).     . terazosin (HYTRIN) 5 MG capsule Take 1 capsule (5 mg total) by mouth every morning. 30 capsule 6  . Testosterone Cypionate 200 MG/ML KIT Inject 1 mL into the muscle every 14 (fourteen) days.    [name] torsemide (DEMADEX) 20 MG tablet Take 40 mg by mouth  every morning.     . traMADol (ULTRAM) 50 MG tablet Take 50 mg by mouth every 6 (six) hours as needed for pain.    [name] zolpidem (AMBIEN) 10 MG tablet Take 10 mg by  mouth at bedtime as needed for sleep.      Home:  Home Living  Family/patient expects to be discharged to:: Private residence  Living Arrangements: Spouse/significant other  Available Help at Discharge: Family, Available 24 hours/day, Friend(s)  Type of Home: House  Home Access: Stairs to enter  [employer] of Steps: 2  Entrance Stairs-Rails: Left  Home Layout: Two level, Able to live on main level with bedroom/bathroom  Alternate Level Stairs-Number of Steps: Stair lift  Bathroom Shower/Tub: Walk-in shower  Home Equipment: Environmental consultant - 2 wheels, Grab bars - tub/shower, Grab bars - toilet, Toilet riser, Bedside commode, Wheelchair - manual, Other (comment)  Additional Comments: attempting to get hospital bed at home.  Functional History:  Prior Function  Level of Independence: Independent  Comments: Pt was able to perform ADLs prior to surgery on 04/05/17  Functional Status:  Mobility:  Bed Mobility  Overal bed mobility: Needs Assistance  Bed Mobility: Rolling, Sidelying to Sit  Rolling: +2 for safety/equipment, Min assist  Sidelying to sit: Mod assist, +2 for safety/equipment  Sit to supine: Max assist, +2 for physical assistance  General bed mobility comments: Cues for technqiue and use of rail; Light mod assist to elevate tunk to sitting position  Transfers  Overall transfer level: Needs assistance  Equipment used: Rolling walker (2 wheeled)  Transfer via Lift Equipment: Stedy  Transfers: Sit to/from Stand  Sit to Stand: Mod assist, +2 physical assistance, Max assist, +2 safety/equipment  General transfer comment: Pt performed sit<>stand using sara steady with Mod A. Then performed 3 reps of terminal stance in the [place]; Required Max A for control and safety with decent to recliner seat  Ambulation/Gait   General Gait Details: Hopeful to be able to take steps next session   ADL:  ADL  Overall ADL's : Needs assistance/impaired  Grooming: Oral care, Minimal assistance, Moderate assistance, Sitting  Grooming Details (indicate cue type and reason): Pt performed groomign at EOB. Presents with posterior lean when performign tasks at EOB and required Min-Mod A. Pt demonstrates good correction of posture with VCs  Upper Body Bathing: Sitting, Moderate assistance  Lower Body Bathing: Maximal assistance, Sit to/from stand  Upper Body Dressing : Maximal assistance, Sitting  Lower Body Dressing: Maximal assistance, Sit to/from stand  Lower Body Dressing Details (indicate cue type and reason): Donned pt's socks for him due to pain with lean forwards becasue of scrodal edema  Toilet Transfer: Maximal assistance, Stand-pivot (Simulated to reliner with Clarise Cruz stedy)  Toilet Transfer Details (indicate cue type and reason): Wife A with using urinal due to swelling  Toileting- Clothing Manipulation and Hygiene: Maximal assistance  Toileting - Clothing Manipulation Details (indicate cue type and reason): Wife A with using urinal due to swelling  Cognition:  Cognition  Overall Cognitive Status: Within Functional Limits for tasks assessed  Orientation Level: Oriented X4  Cognition  Arousal/Alertness: Awake/alert  Behavior During Therapy: WFL for tasks assessed/performed  Overall Cognitive Status: Within Functional Limits for tasks assessed  Physical Exam:  Blood pressure (!) 140/58, pulse (!) 54, temperature 98.2 F (36.8 C), temperature source Oral, resp. rate 17, height 5' 11"  (1.803 m), weight 101 kg (222 lb 10.6 oz), SpO2 100 %.  Physical Exam  Vitals reviewed.  Constitutional: He appears well-developed.  HENT:  Head: Normocephalic and atraumatic.  Eyes: EOM are normal. Left eye exhibits no discharge.  Neck: Normal range of motion. Neck supple. No JVD present. No tracheal deviation present. No  thyromegaly present.  Cardiovascular: Exam  reveals no friction rub.  No murmur heard. Cardiac rate controlled  Respiratory: Effort normal and breath sounds normal. No respiratory distress. He has no wheezes. He has no rales.  GI: Soft. Bowel sounds are normal. There is no rebound.  PD catheter noted. Abdomen slightly tender with palpation  Skin.warm and dry. Dry dressing to left hand. Incisions on abdomen with sutures/intact. Some scattered bruising around abdomen.  GU: persistent penile edema with mild weeping  Neurological: CN exam grossly intact. Fair insight and awareness. Follows simple commands. STM deficits. Bilateral UE strength grossly 3- to 3/5 prox to 4/5 distally. RUE remains somewhat limited due to biceps tendon injury. Bilateral LE: 3-/5 HF, 3/5 KE and 4/5 ADF/PF. Sensation intact to LT.  Psych: pleasant and appropriate  :  Lab Results Last 48 Hours                                                                                                                                                                   Imaging Results (Last 48 hours)     Medical Problem List and Plan:  1. Weakness with cognitive deficits secondary to hepatic encephalopathy and multiple medical issues  -admit to inpatient rehab  2. DVT Prophylaxis/Anticoagulation: Chronic Coumadin. Monitor for rebleeding episodes  3. Pain Management: Valium 10 mg daily as needed, Ultram 100 mg every 6 hours as needed  4. Mood: Provide emotional support  5. Neuropsych: This patient is capable of making decisions on his own behalf.  6. Skin/Wound Care: Cleanse wounds to left hand informed normal saline. Apply Bactroban Vaseline gauze for atraumatic dressing removed. Wrap with Kerlix changed daily  7. Fluids/Electrolytes/Nutrition: Routine I&O will follow-up chemistries  8. ESRD/CPPD. Status post peritoneal dialysis catheter 04/05/2017. Follow-up renal services  9. Diastolic congestive heart  failure with scrotal edema. Monitor for any signs of fluid  -follow daily weights  10. 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 Status post paracentesis 03/06/2017. Continue lactulose. Follow-up ammonia level  11. Atrial fibrillation. Continue Coumadin. Cardiac rate control  12. Hypertension. Lopressor 50 mg twice a day, Hytrin 5 mg daily, Demadex 40 mg daily  13. Anemia of chronic disease. Follow-up CBC  14. History of gout. Allopurinol 100 mg daily. Monitor for any gout flareups  Post Admission Physician Evaluation:  1. Functional deficits secondary to hepatic encephalopathy/debility. 2. Patient is admitted to receive collaborative, interdisciplinary care between the physiatrist, rehab nursing staff, and therapy team. 3. Patient's level of medical complexity and substantial therapy needs in context of that medical necessity cannot be provided at a lesser intensity of care such as a SNF. 4. Patient has experienced substantial functional loss from his/her baseline which was documented above under the "Functional History" and "Functional Status" headings. Judging by the patient's diagnosis, physical  exam, and functional history, the patient has potential for functional progress which will result in measurable gains while on inpatient rehab. These gains will be of substantial and practical use upon discharge in facilitating mobility and self-care at the household level. 5. Physiatrist will provide 24 hour management of medical needs as well as oversight of the therapy plan/treatment and provide guidance as appropriate regarding the interaction of the two. 6. The Preadmission Screening has been reviewed and patient status is unchanged unless otherwise stated above. 7. 24 hour rehab nursing will assist with bladder management, bowel management, safety, skin/wound care, disease management, medication administration, pain management and patient education and help integrate  therapy concepts, techniques,education, etc. 8. PT will assess and treat for/with: Lower extremity strength, range of motion, stamina, balance, functional mobility, safety, adaptive techniques and equipment, pain control, family education, community reintegration. Goals are: supervision to min assist. 9. OT will assess and treat for/with: ADL's, functional mobility, safety, upper extremity strength, adaptive techniques and equipment, NMR, pain control, ego support, family education. Goals are: supervision to min assist. Therapy may not yet proceed with showering this patient. 10. SLP will assess and treat for/with: cognition, communication. Goals are: supervision to mod I. 11. Case Management and Social Worker will assess and treat for psychological issues and discharge planning. 12. Team conference will be held weekly to assess progress toward goals and to determine barriers to discharge. 13. Patient will receive at least 3 hours of therapy per day at least 5 days per week. 14. ELOS: 14-18 days  15. Prognosis: excellent Meredith Staggers, MD, [place] [facility]  04/16/2017  Cathlyn Parsons., PA-C  04/15/2017

## 2017-04-17 ENCOUNTER — Encounter (HOSPITAL_COMMUNITY): Payer: Self-pay | Admitting: Neurology

## 2017-04-17 ENCOUNTER — Inpatient Hospital Stay (HOSPITAL_COMMUNITY): Payer: Medicare Other | Admitting: Speech Pathology

## 2017-04-17 ENCOUNTER — Inpatient Hospital Stay (HOSPITAL_COMMUNITY): Payer: Medicare Other | Admitting: Occupational Therapy

## 2017-04-17 ENCOUNTER — Inpatient Hospital Stay (HOSPITAL_COMMUNITY): Payer: Medicare Other | Admitting: Physical Therapy

## 2017-04-17 DIAGNOSIS — I4891 Unspecified atrial fibrillation: Secondary | ICD-10-CM

## 2017-04-17 DIAGNOSIS — E119 Type 2 diabetes mellitus without complications: Secondary | ICD-10-CM

## 2017-04-17 DIAGNOSIS — R52 Pain, unspecified: Secondary | ICD-10-CM

## 2017-04-17 DIAGNOSIS — D696 Thrombocytopenia, unspecified: Secondary | ICD-10-CM

## 2017-04-17 DIAGNOSIS — D638 Anemia in other chronic diseases classified elsewhere: Secondary | ICD-10-CM

## 2017-04-17 DIAGNOSIS — I10 Essential (primary) hypertension: Secondary | ICD-10-CM

## 2017-04-17 DIAGNOSIS — R791 Abnormal coagulation profile: Secondary | ICD-10-CM

## 2017-04-17 LAB — GRAM STAIN

## 2017-04-17 LAB — GLUCOSE, CAPILLARY
GLUCOSE-CAPILLARY: 142 mg/dL — AB (ref 65–99)
GLUCOSE-CAPILLARY: 137 mg/dL — AB (ref 65–99)
Glucose-Capillary: 131 mg/dL — ABNORMAL HIGH (ref 65–99)

## 2017-04-17 LAB — PROTIME-INR
INR: 3.17
Prothrombin Time: 33.2 seconds — ABNORMAL HIGH (ref 11.4–15.2)

## 2017-04-17 LAB — PATHOLOGIST SMEAR REVIEW

## 2017-04-17 LAB — AMMONIA: Ammonia: 60 umol/L — ABNORMAL HIGH (ref 9–35)

## 2017-04-17 MED ORDER — INSULIN ASPART 100 UNIT/ML ~~LOC~~ SOLN: 0.0000 [IU] | Freq: Three times a day (TID) | SUBCUTANEOUS | Status: DC

## 2017-04-17 MED ORDER — INSULIN ASPART 100 UNIT/ML ~~LOC~~ SOLN
0.0000 [IU] | Freq: Three times a day (TID) | SUBCUTANEOUS | Status: DC
  Administered 2017-04-17 (×2): 2 [IU] via SUBCUTANEOUS
  Administered 2017-04-18: 5 [IU] via SUBCUTANEOUS
  Administered 2017-04-18 – 2017-04-19 (×2): 2 [IU] via SUBCUTANEOUS
  Administered 2017-04-20: 3 [IU] via SUBCUTANEOUS

## 2017-04-17 MED ORDER — NEPRO/CARBSTEADY PO LIQD
237.0000 mL | Freq: Two times a day (BID) | ORAL | Status: DC
  Administered 2017-04-19: 237 mL via ORAL

## 2017-04-17 MED ORDER — WARFARIN SODIUM 1 MG PO TABS
1.0000 mg | ORAL_TABLET | Freq: Once | ORAL | Status: AC
  Administered 2017-04-17: 1 mg via ORAL
  Filled 2017-04-17: qty 1

## 2017-04-17 MED ORDER — INSULIN ASPART 100 UNIT/ML ~~LOC~~ SOLN: 0.0000 [IU] | Freq: Every day | SUBCUTANEOUS | Status: DC

## 2017-04-17 MED ORDER — TRAMADOL HCL 50 MG PO TABS
50.0000 mg | ORAL_TABLET | Freq: Four times a day (QID) | ORAL | Status: DC | PRN
  Administered 2017-04-17 – 2017-04-20 (×4): 50 mg via ORAL
  Filled 2017-04-17 (×6): qty 1

## 2017-04-17 NOTE — IPOC Note (Signed)
Overall Plan of Care [facility]) Patient Details Name: BLISS TSANG, MD MRN: [id number] DOB: [birth year]  Admitting Diagnosis: ABD Pain  Hospital Problems: Active Problems:   Hepatic encephalopathy ([place])   Supratherapeutic INR   Pain   Atrial fibrillation ([place])   Benign essential HTN   Anemia of chronic disease   Diabetes mellitus type 2 in nonobese (HCC)   Thrombocytopenia ([place])     Functional Problem List: Nursing Bladder, Bowel, Edema, Endurance, Medication Management, Motor, Nutrition, Pain, Safety, Skin Integrity  PT Balance, Edema, Endurance, Motor, Pain  OT Balance, Edema, Endurance, Safety  SLP Cognition  TR         Basic ADL's: OT Grooming, Bathing, Dressing, Toileting     Advanced  ADL's: OT       Transfers: PT Bed Mobility, Bed to Chair, Car, Furniture, Futures trader, Metallurgist: PT Ambulation, Emergency planning/management officer, Stairs     Additional Impairments: OT    SLP Social Cognition   Problem Solving, Memory  TR      Anticipated Outcomes Item Anticipated Outcome  Self Feeding independent  Swallowing      Basic self-care  supervision  Toileting  supervision   Bathroom Transfers supervision  Bowel/Bladder  Continent of bowel and bladder  Transfers  supervision  Locomotion  supervision for ambulation household distances, Mod I w/c propulsion household distances and min assist community distances  Communication     Cognition  supervision   Pain  Pain <4 out 10  Safety/Judgment  No fall this admission   Therapy Plan: PT Frequency: 5 out of 7 days PT Duration Estimated Length of Stay: 14-18 days OT Intensity: Minimum of 1-2 x/day, 45 to 90 minutes OT Frequency: 5 out of 7 days OT Duration/Estimated Length of Stay: 12-14 days SLP Intensity: Minumum of 1-2 x/day, 30 to 90 minutes SLP Frequency: 3 to 5 out of 7 days SLP Duration/Estimated Length of Stay: 14-18 days        Team Interventions: Nursing Interventions  Patient/Family Education, Disease Management/Prevention, Skin Care/Wound Management, Discharge Planning, Psychosocial Support, Pain Management, Bladder Management, Medication Management, Bowel Management  PT interventions Ambulation/gait training, Cognitive remediation/compensation, Community reintegration, Discharge planning, Disease management/prevention, DME/adaptive equipment instruction, Functional mobility training, Neuromuscular re-education, Pain management, Patient/family education, Psychosocial support, Stair training, Therapeutic Activities, Therapeutic Exercise, UE/LE Strength taining/ROM, UE/LE Coordination activities, Wheelchair propulsion/positioning  OT Interventions Training and development officer, Cognitive remediation/compensation, Academic librarian, Engineer, drilling, Discharge planning, Functional mobility training, Patient/family education, Neuromuscular re-education, Therapeutic Exercise, Therapeutic Activities, Self Care/advanced ADL retraining, UE/LE Coordination activities, UE/LE Strength taining/ROM  SLP Interventions Cognitive remediation/compensation, Cueing hierarchy, Internal/external aids, Patient/family education  TR Interventions    SW/CM Interventions Discharge Planning, Psychosocial Support, Patient/Family Education    Team Discharge Planning: Destination: PT-Home ,OT- Home , SLP-Home Projected Follow-up: PT-Home health PT, 24 hour supervision/assistance, OT-  Home health OT, 24 hour supervision/assistance, SLP-Other (comment) (TBD) Projected Equipment Needs: PT-Wheelchair (measurements), Wheelchair cushion (measurements), OT- To be determined, SLP-None recommended by SLP Equipment Details: PT-has rolling walker and rollator at home. , OT-  Patient/family involved in discharge planning: PT- Patient, Family member/caregiver,  OT-Patient, Family member/caregiver, SLP-Patient  MD ELOS: 14-17 days. 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 Patient lives with spouse independent with assistive device prior to admission but generalized decline over the past few weeks and had been essentially bedbound. 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 Presented 04/08/2017 with altered mental status, scrotal edema and abdominal discomfort. No chest pain. Sodium 129, BUN 133, creatinine 4.3, ammonia 79. Started on lactulose. Noted recent paracentesis 03/06/2017 for hepatopathy with 2 L yield Hemodialysis/CCPD ongoing. Follow-up Gen. surgery for scrotal edema no active bleeding with conservative care.WOC follow-up for left dorsal hand wound from a dog scratch with skin care as directed. Lactulose adjusted. Xarelto was not recommended while with dialysis and switched to Coumadin. Pt with resulting functional deficits with mobility, endurance, self-care, cognition. Will set goals for supervision with PT/OT/SLP.   See Team Conference Notes for weekly updates to the plan of care

## 2017-04-17 NOTE — Progress Notes (Signed)
Physical Therapy Assessment and Plan  Patient Details  Name: [name] TIBBITTS, MD MRN: [id number] Date of Birth: 1941-06-11  PT Diagnosis: Abnormality of gait, Difficulty walking, Edema and Muscle weakness Rehab Potential: Excellent ELOS: 14-18 days   Today's Date: 04/17/2017 PT Individual Time: 1638-4536 PT Individual Time Calculation (min): 79 min    Problem List:  Patient Active Problem List   Diagnosis Date Noted  . Supratherapeutic INR   . Pain   . Atrial fibrillation ([place])   . Benign essential HTN   . Anemia of chronic disease   . Diabetes mellitus type 2 in nonobese (HCC)   . Thrombocytopenia ([place])   . Hepatic encephalopathy ([place]) 04/16/2017  . Confusion 04/08/2017  . Acute hepatic encephalopathy 04/08/2017  . ESRD on dialysis ([place]) 04/08/2017  . Chronic systolic CHF (congestive heart failure) ([place]) 09/26/2016  . Right heart failure ([place]) 06/26/2016  . Leg wound, left 06/26/2016  . Chronic diastolic CHF (congestive heart failure) ([place]) 03/22/2016  . Ascites 12/02/2015  . Diarrhea 12/02/2015  . Leg cramps 12/02/2015  . Acute on chronic diastolic CHF (congestive heart failure), NYHA class 1 ([place])   . Diastolic dysfunction 46/80/3212  . Dyspnea   . CHF (congestive heart failure) ([place]) 09/06/2015  . CAD (coronary artery disease) 05/11/2015  . S/P placement of cardiac pacemaker 05/10/2015  . Acute on chronic diastolic CHF (congestive heart failure) ([place]) 05/10/2015  . Cardiac device in situ   . Sick sinus syndrome ([place]) 05/05/2015  . Acute on chronic renal failure ([place]) 03/23/2015  . Bradycardia, sinus, persistent, severe 03/23/2015  . Acute hyponatremia 03/23/2015  . Hyperkalemia 03/23/2015  . Rib pain on right side 03/23/2015  . Poor appetite 03/23/2015  . Acute gastrointestinal bleeding 03/23/2015  . Nausea & vomiting 03/23/2015  . H/O aortic valve replacement with tissue graft 12/09/2014  . Acute posthemorrhagic anemia 06/01/2014  . UTI (urinary tract  infection) 05/31/2014  . Anxiety 05/31/2014  . Insomnia 05/31/2014  . Weakness 05/26/2014  . Fever 05/26/2014  . OA (osteoarthritis) of knee 05/24/2014  . Cataract 09/02/2013  . Osteoarthritis 09/02/2013  . Atrial flutter ([place]) 08/20/2012  . Back pain 08/20/2012  . Severe tricuspid regurgitation by prior echocardiogram 08/14/2011  . Aortic valve replaced 08/14/2011  . Chronic atrial fibrillation ([place]) 07/05/2011  . Chronic kidney disease (CKD), stage III (moderate)   . Hx of CABG 04/26/2011  . Diabetes ([place]) 04/26/2011  . Gout 04/26/2011  . BPH (benign prostatic hyperplasia) 04/26/2011  . Essential hypertension 04/26/2011  . MYOCARDIAL INFARCTION 11/27/2010  . Ischemic heart disease 11/27/2010  . Valvular heart disease 11/27/2010  . Sarcoidosis 11/24/2010  . Obstructive sleep apnea 11/24/2010  . Aortic valve disorder 11/24/2010  . DYSPNEA 11/24/2010    Past Medical History:  Past Medical History:  Diagnosis Date  . A-fib ([place])    permanent with tachy-brady syndrome  . Anemia   . Anxiety   . Atrial flutter (HCC)    hx of  . Biceps tendon tear    right  . Bone marrow disease   . CHF (congestive heart failure) ([place])   . Chronic kidney disease (CKD), stage III (moderate)    lov note dr Koleen Nimrod nephrology 09-17-13 on chart  . Complication of anesthesia 11-16-13   required alot of versed per anesthesia with cataract surgery  . Coronary heart disease    stent, CABG, RBBB  . DDD (degenerative disc disease)   . Diabetes ([place])   . Diabetes mellitus   . Fungal  toenail infection   . Gout   . Hemorrhagic cystitis 2012  . Hepatitis    mono  . HTN (hypertension)   . Hypercalcemia 2011   due to sarcoidiosis  . Myocardial infarction [facility])    1995, 2002, 2006  . OSA (obstructive sleep apnea)    use bipap setting of 10 and 12  . Osteoarthritis   . Pneumonia 1966, 2011   hx of  . Presence of permanent cardiac pacemaker   . Right sciatic nerve pain    for block  thursday 01-21-2014, injections  . Sarcoid 2011   pulmonary and bone marrow  . Shortness of breath dyspnea   . Synovial cyst of lumbar spine    l3-l4, l4-l5, injections  . Valvular heart disease    aortic stenosis/regurgitation   Past Surgical History:  Past Surgical History:  Procedure Laterality Date  . BASAL CELL CARCINOMA EXCISION Right 2015   ear  . CARDIAC CATHETERIZATION N/A 12/10/2016   Procedure: Right Heart Cath;  Surgeon: Jolaine Artist, MD;  Location: [facility];  Service: Cardiovascular;  Laterality: N/A;  . CARDIAC VALVE REPLACEMENT    . CARDIOVERSION N/A 12/30/2013   Procedure: CARDIOVERSION;  Surgeon: [name], MD;  Location[facility] ENDOSCOPY;  Service: Cardiovascular;  Laterality: N/A;  10:49 cardioversion at 120 joules, then 150 joules, to SB  used  Lido 26m,  Propofol 160 mcg  . CARDIOVERSION  2003, 2006, 2012, 2013  . cataract surgery Right 2007  . cataract surgery Left 11-16-13  . COLONOSCOPY N/A 01/29/2014   Procedure: COLONOSCOPY;  Surgeon: JWinfield Cunas, MD;  [facility];  Service: Endoscopy;  Laterality: N/A;  amanda//ja  . CORONARY ARTERY BYPASS GRAFT  2006   x 5  . EP IMPLANTABLE DEVICE N/A 05/05/2015   Procedure: Pacemaker Implant;  Surgeon: JThompson Grayer MD;  [facility];  Service: Cardiovascular;  Laterality: N/A;  . FLEXIBLE SIGMOIDOSCOPY N/A 12/07/2015   Procedure: FLEXIBLE SIGMOIDOSCOPY;  Surgeon: VWilford Corner MD;  [facility];  Service: Endoscopy;  Laterality: N/A;  Unprepped  . HERNIA REPAIR    . INSERT / REPLACE / REMOVE PACEMAKER  05/05/2015  . PORTACATH PLACEMENT    . portacath removed    . Redo Median sternotomy, extracorporeal cirulation, AVR, Tricuspid valve repair  06/13/2011   AVR(23-mm Edwards pericardial Magna-Ease valve./ TVrepair (34-mm Edwards MC3 annuloplasty ring  . TOTAL KNEE ARTHROPLASTY Right 05/24/2014   Procedure: RIGHT TOTAL KNEE ARTHROPLASTY;  Surgeon: FGearlean Alf MD;  Location: [facility] ORS;  Service: Orthopedics;  Laterality: Right;  . TRANSURETHRAL RESECTION OF PROSTATE  oct. 2012   TURP  . 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 Per chart review patient lives with spouse independent with assistive device prior to admission but generalized decline over the past few weeks and has been essentially bedbound. 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 Presented 04/08/2017 with altered mental status, scrotal edema and abdominal discomfort. No chest pain. Sodium  129, BUN 133, creatinine 4.3, ammonia 79. Started on lactulose. Noted recent paracentesis 03/06/2017 for hepatopathy with 2 L yield Hemodialysis/CCPD ongoing. Follow-up Gen. surgery for scrotal edema no active bleeding with conservative care.WOC follow-up for left dorsal hand wound from a dog scratch with skin care as directed. Lactulose adjusted latest ammonia level of 77.Xarelto was not recommended while with dialysis and switched to Coumadin. Patient transferred to CIR on 04/16/2017 .   Patient currently requires max with mobility secondary to muscle weakness and decreased sitting balance, decreased standing balance and decreased postural control.  Prior to hospitalization, patient was modified independent  with mobility and lived with Spouse in a House home.  Home access is 2Stairs to enter.  Patient will benefit from  skilled PT intervention to maximize safe functional mobility and minimize fall risk for planned discharge home with 24 hour supervision.  Anticipate patient will benefit from follow up [place] at discharge.  PT - End of Session Activity Tolerance: Tolerates 30+ min activity with multiple rests PT Assessment Rehab Potential (ACUTE/IP ONLY): Excellent PT Patient demonstrates impairments in the following area(s): Balance;Edema;Endurance;Motor;Pain PT Transfers Functional Problem(s): Bed Mobility;Bed to Chair;Car;Furniture;Floor PT Locomotion Functional Problem(s): Ambulation;Wheelchair Mobility;Stairs PT Plan PT Frequency: 5 out of 7 days PT Duration Estimated Length of Stay: 14-18 days PT Treatment/Interventions: Ambulation/gait training;Cognitive remediation/compensation;Community reintegration;Discharge planning;Disease management/prevention;DME/adaptive equipment instruction;Functional mobility training;Neuromuscular re-education;Pain management;Patient/family education;Psychosocial support;Stair training;Therapeutic Activities;Therapeutic Exercise;UE/LE Strength taining/ROM;UE/LE Coordination activities;Wheelchair propulsion/positioning PT Transfers Anticipated Outcome(s): supervision PT Locomotion Anticipated Outcome(s): supervision for ambulation household distances, Mod I w/c propulsion household distances and min assist community distances PT Recommendation Recommendations for Other Services: Therapeutic Recreation consult Therapeutic Recreation Interventions: Outing/community reintergration Follow Up Recommendations: Home health PT;24 hour supervision/assistance Patient destination: Home Equipment Recommended: Wheelchair (measurements);Wheelchair cushion (measurements) Equipment Details: has rolling walker and rollator at home.   Skilled Therapeutic Intervention Pt in bed upon arrival, just finishing dialysis. Session delayed due to removal from dialysis. Pt agreeable to PT session. Session  focused on evaluation and functional mobility. Rolling in bed performed to pull pants down to use urinal prior to attempting mobility per pt request. Pt unable to reach down to hold urinal at this time. Pt initially requiring max assist for transfers but improving with repetitions to mod level. Discussed PT goals and plan going forward with pt and spouse. Following session, pt sitting in w/c with spouse present and all needs in reach. Nursing notified.   PT Evaluation Precautions/Restrictions Precautions Precautions: Fall Precaution Comments: scrotal swelling Restrictions Weight Bearing Restrictions: No General PT Amount of Missed Time (min): 11 Minutes PT Missed Treatment Reason: Unavailable (Comment) (finishing dialysis) Vital Signs Pain  Reports pain as 2/10 in abdomen. Monitored during session.  Home Living/Prior Functioning Home Living Available Help at Discharge: Family;Available 24 hours/day;Friend(s) Type of Home: House Home Access: Stairs to enter [employer] of Steps: 2 Entrance Stairs-Rails: Left Home Layout: One level  Lives With: Spouse Prior Function Level of Independence: Requires assistive device for independence  Able to Take Stairs?: Yes Vocation: Retired Leisure: Hobbies-yes (Comment) Comments: bar[employer], taking care of car collection Vision/Perception  Vision - History Baseline Vision: No visual deficits Patient Visual Report: No change from baseline Perception Perception: Within Functional Limits  Cognition Overall Cognitive Status: Within Functional Limits for tasks assessed Arousal/Alertness: Awake/alert Orientation Level: Oriented X4 Safety/Judgment: Appears intact Sensation Sensation Light Touch: Appears Intact (through bilateral LEs) Coordination Gross Motor Movements are Fluid and Coordinated: Yes Fine Motor Movements are Fluid and Coordinated: No Motor  Motor Motor -  Skilled Clinical Observations: generalized weakness       Balance Balance Balance Assessed: Yes Static Sitting Balance Static Sitting - Balance Support: Right upper extremity supported;Feet supported Static Sitting - Level of Assistance: 5: Stand by assistance Dynamic Sitting Balance Dynamic Sitting - Balance Support: Right upper extremity supported;Feet supported Static Standing Balance Static Standing - Balance Support: Bilateral upper extremity supported Static Standing - Level of Assistance: 4: Min assist Dynamic Standing Balance Dynamic Standing - Balance Support: Bilateral upper extremity supported Dynamic Standing - Level of Assistance: 4: Min assist Extremity Assessment  RUE Assessment RUE Assessment: Exceptions to [facility] RUE Strength RUE Overall Strength Comments: generalized weakness LUE Assessment LUE Assessment: Exceptions to [facility] LUE Strength LUE Overall Strength Comments: generalized weakness RLE Assessment RLE Assessment: Exceptions to [facility] RLE Strength RLE Overall Strength Comments: hip flexion 3-/5, knee extension 4-/5, dorsiflexion 3+/5 LLE Strength LLE Overall Strength Comments: hip flexion 3-/5, knee extension 4-/5, dorsiflexion 3+/5   See Function Navigator for Current Functional Status.   Refer to Care Plan for Long Term Goals  Recommendations for other services: Therapeutic Recreation  Outing/community reintegration  Discharge Criteria: Patient will be discharged from PT if patient refuses treatment 3 consecutive times without medical reason, if treatment goals not met, if there is a change in medical status, if patient makes no progress towards goals or if patient is discharged from hospital.  The above assessment, treatment plan, treatment alternatives and goals were discussed and mutually agreed upon: by patient and by family.  Linard Millers, PT 04/17/2017, 12:47 PM

## 2017-04-17 NOTE — Progress Notes (Signed)
ANTICOAGULATION CONSULT NOTE - Follow Up Consult  Pharmacy Consult for warfarin Indication: atrial fibrillation  Patient Measurements: Height: 5\' 11"  (180.3 cm) Weight: 223 lb 15.8 oz (101.6 kg) IBW/kg (Calculated) : 75.3  Vital Signs: Temp: 98 F (36.7 C) (04/25 1015) Temp Source: Oral (04/25 1015) BP: 152/77 (04/25 1015) Pulse Rate: 58 (04/25 1015)  Labs:  Recent Labs  04/15/17 0540 04/16/17 0245 04/17/17 0512  HGB  --  12.3*  --   HCT  --  37.0*  --   PLT  --  82*  --   LABPROT 30.2* 28.2* 33.2*  INR 2.82 2.59 3.17  CREATININE  --  2.12*  --     Assessment: 71 YOM with CKD V/new ESRD with peritoneal dialysis initialed on 04/09/17. The patient was on Xarelto 15 mg once daily PTA for anticoagulation however with new ESRD/PD patient was transitioned to warfarin. Last dose of Xarelto was 4/16 in the evening.  INR on admission (4/16) was 2- due to influence of Xarelto. INR stayed stable until 4/19 when it jumped to 3.9. INR peaked at 5.04 on 4/20.  INR is 3.17 today. Likely only require ~ 2mg  daily  Hgb 12.3, plts chronically low (currently 82). No bleeding noted.   Goal of Therapy:  INR 2-3 Monitor platelets by anticoagulation protocol: Yes   Plan:  - Warfarin 1 mg x 1 tonight  - Daily INR while inpatient - Follow for s/s bleeding  Maryanna Shape, PharmD, BCPS  Clinical Pharmacist  Pager: [phone]   04/17/2017 1:56 PM

## 2017-04-17 NOTE — Evaluation (Signed)
Occupational Therapy Assessment and Plan  Patient Details  Name: [name] STREATER, MD MRN: [id number] Date of Birth: 11-21-1941  OT Diagnosis: cognitive deficits and muscle weakness (generalized) Rehab Potential: Rehab Potential (ACUTE ONLY): Good ELOS: 12-14 days   Today's Date: 04/17/2017 OT Individual Time: 0903-1006 OT Individual Time Calculation (min): 63 min     Problem List:  Patient Active Problem List   Diagnosis Date Noted  . Supratherapeutic INR   . Pain   . Atrial fibrillation ([place])   . Benign essential HTN   . Anemia of chronic disease   . Diabetes mellitus type 2 in nonobese (HCC)   . Thrombocytopenia ([place])   . Hepatic encephalopathy ([place]) 04/16/2017  . Confusion 04/08/2017  . Acute hepatic encephalopathy 04/08/2017  . ESRD on dialysis ([place]) 04/08/2017  . Chronic systolic CHF (congestive heart failure) ([place]) 09/26/2016  . Right heart failure ([place]) 06/26/2016  . Leg wound, left 06/26/2016  . Chronic diastolic CHF (congestive heart failure) ([place]) 03/22/2016  . Ascites 12/02/2015  . Diarrhea 12/02/2015  . Leg cramps 12/02/2015  . Acute on chronic diastolic CHF (congestive heart failure), NYHA class 1 ([place])   . Diastolic dysfunction 53/29/9242  . Dyspnea   . CHF (congestive heart failure) ([place]) 09/06/2015  . CAD (coronary artery disease) 05/11/2015  . S/P placement of cardiac pacemaker 05/10/2015  . Acute on chronic diastolic CHF (congestive heart failure) ([place]) 05/10/2015  . Cardiac device in situ   . Sick sinus syndrome ([place]) 05/05/2015  . Acute on chronic renal failure ([place]) 03/23/2015  . Bradycardia, sinus, persistent, severe 03/23/2015  . Acute hyponatremia 03/23/2015  . Hyperkalemia 03/23/2015  . Rib pain on right side 03/23/2015  . Poor appetite 03/23/2015  . Acute gastrointestinal bleeding 03/23/2015  . Nausea & vomiting 03/23/2015  . H/O aortic valve replacement with tissue graft 12/09/2014  . Acute posthemorrhagic anemia 06/01/2014  . UTI  (urinary tract infection) 05/31/2014  . Anxiety 05/31/2014  . Insomnia 05/31/2014  . Weakness 05/26/2014  . Fever 05/26/2014  . OA (osteoarthritis) of knee 05/24/2014  . Cataract 09/02/2013  . Osteoarthritis 09/02/2013  . Atrial flutter ([place]) 08/20/2012  . Back pain 08/20/2012  . Severe tricuspid regurgitation by prior echocardiogram 08/14/2011  . Aortic valve replaced 08/14/2011  . Chronic atrial fibrillation ([place]) 07/05/2011  . Chronic kidney disease (CKD), stage III (moderate)   . Hx of CABG 04/26/2011  . Diabetes ([place]) 04/26/2011  . Gout 04/26/2011  . BPH (benign prostatic hyperplasia) 04/26/2011  . Essential hypertension 04/26/2011  . MYOCARDIAL INFARCTION 11/27/2010  . Ischemic heart disease 11/27/2010  . Valvular heart disease 11/27/2010  . Sarcoidosis 11/24/2010  . Obstructive sleep apnea 11/24/2010  . Aortic valve disorder 11/24/2010  . DYSPNEA 11/24/2010    Past Medical History:  Past Medical History:  Diagnosis Date  . A-fib ([place])    permanent with tachy-brady syndrome  . Anemia   . Anxiety   . Atrial flutter (HCC)    hx of  . Biceps tendon tear    right  . Bone marrow disease   . CHF (congestive heart failure) ([place])   . Chronic kidney disease (CKD), stage III (moderate)    lov note dr Koleen Nimrod nephrology 09-17-13 on chart  . Complication of anesthesia 11-16-13   required alot of versed per anesthesia with cataract surgery  . Coronary heart disease    stent, CABG, RBBB  . DDD (degenerative disc disease)   . Diabetes ([place])   . Diabetes mellitus   .  Fungal toenail infection   . Gout   . Hemorrhagic cystitis 2012  . Hepatitis    mono  . HTN (hypertension)   . Hypercalcemia 2011   due to sarcoidiosis  . Myocardial infarction [facility])    1995, 2002, 2006  . OSA (obstructive sleep apnea)    use bipap setting of 10 and 12  . Osteoarthritis   . Pneumonia 1966, 2011   hx of  . Presence of permanent cardiac pacemaker   . Right sciatic nerve pain     for block thursday 01-21-2014, injections  . Sarcoid 2011   pulmonary and bone marrow  . Shortness of breath dyspnea   . Synovial cyst of lumbar spine    l3-l4, l4-l5, injections  . Valvular heart disease    aortic stenosis/regurgitation   Past Surgical History:  Past Surgical History:  Procedure Laterality Date  . BASAL CELL CARCINOMA EXCISION Right 2015   ear  . CARDIAC CATHETERIZATION N/A 12/10/2016   Procedure: Right Heart Cath;  Surgeon: Jolaine Artist, MD;  Location: [place] [facility];  Service: Cardiovascular;  Laterality: N/A;  . CARDIAC VALVE REPLACEMENT    . CARDIOVERSION N/A 12/30/2013   Procedure: CARDIOVERSION;  Surgeon: [name], MD;  Location: [facility] ENDOSCOPY;  Service: Cardiovascular;  Laterality: N/A;  10:49 cardioversion at 120 joules, then 150 joules, to SB  used  Lido 33m,  Propofol 160 mcg  . CARDIOVERSION  2003, 2006, 2012, 2013  . cataract surgery Right 2007  . cataract surgery Left 11-16-13  . COLONOSCOPY N/A 01/29/2014   Procedure: COLONOSCOPY;  Surgeon: JWinfield Cunas, MD;  [facility];  Service: Endoscopy;  Laterality: N/A;  amanda//ja  . CORONARY ARTERY BYPASS GRAFT  2006   x 5  . EP IMPLANTABLE DEVICE N/A 05/05/2015   Procedure: Pacemaker Implant;  Surgeon: JThompson Grayer MD;  [facility];  Service: Cardiovascular;  Laterality: N/A;  . FLEXIBLE SIGMOIDOSCOPY N/A 12/07/2015   Procedure: FLEXIBLE SIGMOIDOSCOPY;  Surgeon: VWilford Corner MD;  [facility];  Service: Endoscopy;  Laterality: N/A;  Unprepped  . HERNIA REPAIR    . INSERT / REPLACE / REMOVE PACEMAKER  05/05/2015  . PORTACATH PLACEMENT    . portacath removed    . Redo Median sternotomy, extracorporeal cirulation, AVR, Tricuspid valve repair  06/13/2011   AVR(23-mm Edwards pericardial Magna-Ease valve./ TVrepair (34-mm Edwards MC3 annuloplasty ring  . TOTAL KNEE ARTHROPLASTY Right 05/24/2014   Procedure: RIGHT TOTAL KNEE ARTHROPLASTY;  Surgeon:  FGearlean Alf MD;  Location: [facility] OR[facility];  Service: Orthopedics;  Laterality: Right;  . TRANSURETHRAL RESECTION OF PROSTATE  oct. 2012   TURP  . VASECTOMY  1977    Assessment & Plan Clinical Impression: Patient is a 76y.o. year old male with recent admission to the hospital on 04/08/2017 with altered mental status, scrotal edema and abdominal discomfort. No chest pain. Sodium 129, BUN 133, creatinine 4.3, ammonia 79. Started on lactulose. Noted recent paracentesis 03/06/2017 for hepatopathy with 2 L yield Hemodialysis/CCPD ongoing. Follow-up Gen. surgery for scrotal edema no active bleeding with conservative care.WOC follow-up for left dorsal hand wound from a dog scratch with skin care as directed .  Patient transferred to CIR on 04/16/2017 .    Patient currently requires max with basic self-care skills secondary to muscle weakness, decreased cardiorespiratoy endurance, decreased awareness, decreased problem solving and decreased memory and decreased sitting balance, decreased standing balance and decreased balance strategies.  Prior to hospitalization, patient could complete  ADLs with supervision.  Patient will benefit from skilled intervention to decrease level of assist with basic self-care skills and increase independence with basic self-care skills prior to discharge home with care partner.  Anticipate patient will require 24 hour supervision and follow up home health.  OT - End of Session Activity Tolerance: Decreased this session;Tolerates 10 - 20 min activity with multiple rests Endurance Deficit: Yes OT Assessment Rehab Potential (ACUTE ONLY): Good OT Patient demonstrates impairments in the following area(s): Balance;Edema;Endurance;Safety OT Basic ADL's Functional Problem(s): Grooming;Bathing;Dressing;Toileting OT Transfers Functional Problem(s): Toilet;Tub/Shower OT Plan OT Intensity: Minimum of 1-2 x/day, 45 to 90 minutes OT Frequency: 5 out of 7 days OT Duration/Estimated Length  of Stay: 12-14 days OT Treatment/Interventions: Balance/vestibular training;Cognitive remediation/compensation;Community reintegration;DME/adaptive equipment instruction;Discharge planning;Functional mobility training;Patient/family education;Neuromuscular re-education;Therapeutic Exercise;Therapeutic Activities;Self Care/advanced ADL retraining;UE/LE Coordination activities;UE/LE Strength taining/ROM OT Self Feeding Anticipated Outcome(s): independent OT Basic Self-Care Anticipated Outcome(s): supervision OT Toileting Anticipated Outcome(s): supervision OT Bathroom Transfers Anticipated Outcome(s): supervision OT Recommendation Patient destination: Home Follow Up Recommendations: Home health OT;24 hour supervision/assistance Equipment Recommended: To be determined   Skilled Therapeutic Intervention Began working on selfcare retraining sit to stand at the EOB during session.  Increased posterior lean and LOB noted during bathing tasks with overall mod assist needed.  Sit to stand initially mod assist with +2 assistance needed for peri care and for donning new brief as well as pulling pants over hips.  Pt demonstrates decreased ability to complete forward trunk flexion for reaching his feet secondary to abdominal swelling.  Will likely benefit from AE trial to increase independence.  Pt left in bed at end of session with spouse present.  Discussed expectations of supervision level goals and ELOS of 2 weeks.  Both agree to current OT goals and plan.  Pt left with call button and phone in reach.   OT Evaluation Precautions/Restrictions  Precautions Precautions: Fall Precaution Comments: scrotal swelling Restrictions Weight Bearing Restrictions: No  Pain Pain Assessment Pain Assessment: No/denies pain Home Living/Prior Functioning Home Living Family/patient expects to be discharged to:: Private residence Living Arrangements: Spouse/significant other Available Help at Discharge: Family,  Available 24 hours/day, Friend(s) Type of Home: House Home Access: Stairs to enter Technical brewer of Steps: 2 Entrance Stairs-Rails: Left Home Layout: One level Bathroom Shower/Tub: Multimedia programmer: Programmer, systems: Yes  Lives With: Spouse IADL History Homemaking Responsibilities: No Education: MD Occupation: Retired Prior Function Level of Independence: Needs assistance with ADLs  Able to Take Stairs?: Yes Driving: Yes Vocation: Retired Leisure: Hobbies-yes (Comment) Comments: barber quartet, taking care of car collection ADL  See Function Section of chart for details  Vision/Perception  Vision- Assessment Eye Alignment: Within Functional Limits Perception Perception: Within Functional Limits Praxis Praxis: Intact  Cognition Overall Cognitive Status:  (versus medication effects versus fatigue ) Arousal/Alertness: Awake/alert Orientation Level: Person;Place;Situation Person: Oriented Place: Oriented Situation: Oriented Year: 2018 Month: April Day of Week: Incorrect Memory: Impaired Memory Impairment: Decreased short term memory Decreased Short Term Memory: Verbal basic;Functional basic Immediate Memory Recall: Sock;Blue;Bed Memory Recall: Sock;Blue;Bed Memory Recall Sock: Without Cue Memory Recall Blue: Without Cue Memory Recall Bed: Without Cue Attention: Selective Selective Attention: Appears intact Awareness: Impaired Awareness Impairment: Anticipatory impairment Problem Solving: Impaired Problem Solving Impairment: Functional complex Executive Function: Self Correcting;Self Monitoring Self Monitoring: Impaired Self Monitoring Impairment: Functional complex Self Correcting: Impaired Self Correcting Impairment: Functional complex Safety/Judgment: Appears intact Sensation Sensation Light Touch: Appears Intact Stereognosis: Appears Intact Hot/Cold: Appears Intact Proprioception: Appears  Intact Coordination Gross Motor  Movements are Fluid and Coordinated: Yes Fine Motor Movements are Fluid and Coordinated: No Motor  Motor Motor - Skilled Clinical Observations: generalized weakness  Mobility  Transfers Transfers: Sit to Stand;Stand to Sit Sit to Stand: 3: Mod assist;With upper extremity assist;From bed Stand to Sit: 3: Mod assist;With upper extremity assist;To bed  Trunk/Postural Assessment  Cervical Assessment Cervical Assessment: Within Functional Limits Thoracic Assessment Thoracic Assessment: Exceptions to [facility] (thoracic rounding) Lumbar Assessment Lumbar Assessment: Exceptions to [facility] (posterior pelvic tilt) Postural Control Postural Control: Deficits on evaluation (Posterior lean and LOB when sitting unsupported EOB. )  Balance Balance Balance Assessed: Yes Static Sitting Balance Static Sitting - Balance Support: No upper extremity supported Static Sitting - Level of Assistance: 4: Min assist Dynamic Sitting Balance Dynamic Sitting - Balance Support: Feet supported;During functional activity Dynamic Sitting - Level of Assistance: 3: Mod assist Static Standing Balance Static Standing - Balance Support: Bilateral upper extremity supported;During functional activity Static Standing - Level of Assistance: 3: Mod assist Dynamic Standing Balance Dynamic Standing - Balance Support: During functional activity Dynamic Standing - Level of Assistance: 3: Mod assist Extremity/Trunk Assessment RUE Assessment RUE Assessment: Exceptions to [facility] RUE Strength RUE Overall Strength Comments: Pt with history of proximal biceps tendon tear per spouse.  Shoulder AAROM WFLS but AROM only 2-/5,  AROM elbow and grip WFLS with strength 4/5 throughout LUE Assessment LUE Assessment: Exceptions to [facility] LUE Strength LUE Overall Strength Comments: AROM WFLS for all joints, strength 4/5 throughout   See Function Navigator for Current Functional Status.   Refer to Care Plan for Long  Term Goals  Recommendations for other services: None    Discharge Criteria: Patient will be discharged from OT if patient refuses treatment 3 consecutive times without medical reason, if treatment goals not met, if there is a change in medical status, if patient makes no progress towards goals or if patient is discharged from hospital.  The above assessment, treatment plan, treatment alternatives and goals were discussed and mutually agreed upon: by patient  Khadar Monger OTR/L 04/17/2017, 5:23 PM

## 2017-04-17 NOTE — Progress Notes (Signed)
Patient information reviewed and entered into eRehab system by Foxx Klarich, RN, CRRN, PPS Coordinator.  Information including medical coding and functional independence measure will be reviewed and updated through discharge.     Per nursing patient was given "Data Collection Information Summary for Patients in Inpatient Rehabilitation Facilities with attached "Privacy Act Statement-Health Care Records" upon admission.  

## 2017-04-17 NOTE — Progress Notes (Signed)
Patient ID: [name], MD, male   DOB: 12/09/1941, 76 y.o.   MRN: [id number]  [facility] Progress Note    Subjective:   Had a terrible night, "machine alarmed all night"   Objective:   BP (!) 152/77   Pulse (!) 58   Temp 98 F (36.7 C)   Resp 16   Ht 5\' 11"  (1.803 m)   Wt 101.6 kg (223 lb 15.8 oz)   SpO2 99%   BMI 31.24 kg/m   Intake/Output: No intake/output data recorded.   Intake/Output this shift:  Total I/O In: 240 [P.O.:240] Out: -  Weight change:   Physical Exam: Gen:NAD, fatigued CVS:no rub Resp:decreased BS at bases BCW:[id number], but soft, mildly tender LUQ, PD cath exit site with dried blood, diffuse ecchymosis around abdomen Ext:2+ edema  Labs: BMET  Recent Labs Lab 04/11/17 0946 04/13/17 0310 04/14/17 0629 04/16/17 0245  NA 131* 133* 132* 136  K 3.3* 2.8* 2.9* 3.4*  CL 93* 94* 94* 98*  CO2 28 28 29 27   GLUCOSE 156* 190* 132* 219*  BUN 92* 82* 73* 67*  CREATININE 3.03* 2.63* 2.53* 2.12*  ALBUMIN 2.5*  --   --  2.4*  CALCIUM 8.6* 8.5* 8.3* 8.4*  PHOS 4.4  --   --  3.1   CBC  Recent Labs Lab 04/12/17 0435 04/16/17 0245  WBC 5.9 5.9  HGB 12.5* 12.3*  HCT 36.2* 37.0*  MCV 91.9 93.7  PLT 76* 82*    @IMGRELPRIORS @ Medications:    . allopurinol  100 mg Oral Q breakfast  . insulin aspart  0-15 Units Subcutaneous TID WC  . lactulose  20 g Oral TID  . loratadine  10 mg Oral Daily  . metoprolol  50 mg Oral BID  . mupirocin cream   Topical BID  . potassium chloride  40 mEq Oral BID  . terazosin  5 mg Oral [id number]  . torsemide  40 mg Oral Daily  . Warfarin - Pharmacist Dosing Inpatient   Does not apply [id number]     Assessment/ Plan:    1. Right sided CHF with scrotal swelling. CT scan negative for hernia or leak 2. ESRD doing well with CCPD. tolerating recent increase of volume to 1500 ml to improve clearance and increase fill time to help with abdominal pain/cramping. I spoke with Dr. Raul Del regarding  removing the sutures from the surgery and will wait until early next week due to ascites and hernia repair.   1. Will increase drain time and have them change cyclers as this one alarmed all night.  His last drain was markedly improved.   3. Anemia: stable 4. CKD-MBD: stable 5. Nutrition: renal diet, low K cont with supplementation and change diet without potassium restriction 6. Hypertension: stable 7. Hepatic encephalopathy- presumably related to right heart failure continue with lactulose 8. Deconditioning- PT/OT and CIR when bed available Donetta Potts, MD [place] Pager [phone] 04/17/2017, 12:03 PM

## 2017-04-17 NOTE — Progress Notes (Signed)
[place] PHYSICAL MEDICINE & REHABILITATION     PROGRESS NOTE  Subjective/Complaints:  Pt seen laying in bed this AM.  He states he had a "terrible night" due to issues with timing of pain medications.  He needs to use the restroom this AM and states he needs someone to lift him up and take him there.   ROS: Denies CP, SOB, N/V/D.  Objective: Vital Signs: Blood pressure (!) 148/73, pulse 61, temperature 97.7 F (36.5 C), temperature source Oral, resp. rate 16, height 5\' 11"  (1.803 m), weight 102.5 kg (225 lb 15.5 oz), SpO2 99 %. No results found.  Recent Labs  04/16/17 0245  WBC 5.9  HGB 12.3*  HCT 37.0*  PLT 82*    Recent Labs  04/16/17 0245  NA 136  K 3.4*  CL 98*  GLUCOSE 219*  BUN 67*  CREATININE 2.12*  CALCIUM 8.4*   CBG (last 3)  No results for input(s): GLUCAP in the last 72 hours.  Wt Readings from Last 3 Encounters:  04/17/17 102.5 kg (225 lb 15.5 oz)  04/16/17 99.8 kg (220 lb 0.3 oz)  03/02/17 99.8 kg (220 lb)    Physical Exam:  BP (!) 148/73 (BP Location: Right Arm)   Pulse 61   Temp 97.7 F (36.5 C) (Oral)   Resp 16   Ht 5\' 11"  (1.803 m)   Wt 102.5 kg (225 lb 15.5 oz)   SpO2 99%   BMI 31.52 kg/m  Constitutional: He appears well-developed. NAD. HENT: Normocephalic and atraumatic.  Eyes: EOMI. No discharge.  Cardiovascular: IRRR. No JVD. Respiratory: Effort normal and breath sounds normal.  GI: Soft. Bowel sounds are normal.  PD catheter noted.  Neurological:  Follows simple commands.  STM deficits.  Motor: 4--4/5 throughout Skin.warm and dry. Dry dressing to LUE/LLE. Incisions on abdomen with sutures/intact. Some scattered bruising around abdomen.  Psych: Normal mood and affect.  Assessment/Plan: 1. Functional deficits secondary to debility which require 3+ hours per day of interdisciplinary therapy in a comprehensive inpatient rehab setting. Physiatrist is providing close team supervision and 24 hour management of active medical  problems listed below. Physiatrist and rehab team continue to assess barriers to discharge/monitor patient progress toward functional and medical goals.  Function:  Bathing Bathing position      Bathing parts      Bathing assist        Upper Body Dressing/Undressing Upper body dressing                    Upper body assist        Lower Body Dressing/Undressing Lower body dressing                                  Lower body assist        Toileting Toileting          Toileting assist     Transfers Chair/bed transfer             Locomotion Ambulation           Wheelchair          Cognition Comprehension Comprehension assist level: Understands complex 90% of the time/cues 10% of the time  Expression Expression assist level: Expresses complex 90% of the time/cues < 10% of the time  Social Interaction Social Interaction assist level: Interacts appropriately with others with medication or extra time (anti-anxiety, antidepressant).  Problem Solving  Problem solving assist level: Solves complex problems: With extra time  Memory      Medical Problem List and Plan:  1. Weakness with cognitive deficits secondary to hepatic encephalopathy and multiple medical issues   Begin CIR  Records reviewed, images reviewed - no intracranial injury on CT 2. DVT Prophylaxis/Anticoagulation: Chronic Coumadin. Monitor for rebleeding episodes   INR supratherapeutic 4/24 3. Pain Management: Valium 10 mg daily as needed, Ultram 100 mg every 6 hours as needed  4. Mood: Provide emotional support  5. Neuropsych: This patient is not fully capable of making decisions on his own behalf.  6. Skin/Wound Care: Cleanse wounds to left hand Apply Bactroban Vaseline gauze for atraumatic dressing removed. Wrap with Kerlix changed daily  7. Fluids/Electrolytes/Nutrition: Routine I&Os 8. ESRD/CPPD. Status post peritoneal dialysis catheter 04/05/2017. Follow-up renal services   9. 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 Monitor for any signs of fluid  Filed Weights   04/16/17 2015 04/17/17 0700  Weight: 102.3 kg (225 lb 8.5 oz) 102.5 kg (225 lb 15.5 oz)   10. 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 Status post paracentesis 03/06/2017. Continue lactulose.   Ammonia 60 on 4/25 11. Atrial fibrillation. Continue Coumadin. Cardiac rate control  12. Hypertension. Lopressor 50 mg twice a day, Hytrin 5 mg daily, Demadex 40 mg daily   Monitor with increased mobility 13. Anemia of chronic disease.   Hb 12.3 on 4/24  Cont to monitor 14. History of gout. Allopurinol 100 mg daily. Monitor for any gout flareups  15. DM type 2  Monitor with increased mobility 16. Thrombocytopenia  Plts 82 on 4/24  Cont to monitor   LOS (Days) 1 A FACE TO FACE EVALUATION WAS PERFORMED  Satine Hausner Lorie Phenix 04/17/2017 8:47 AM

## 2017-04-17 NOTE — Progress Notes (Signed)
Patient in bed resting, No signs of distress, no complaints of pain, oriented  to the unit, call bell within reach

## 2017-04-17 NOTE — Evaluation (Signed)
Speech Language Pathology Assessment and Plan  Patient Details  Name: [name] MOLNER, [name] MRN: [id number] Date of Birth: [birth year]  SLP Diagnosis: Cognitive Impairments  Rehab Potential: Good ELOS: 14-18 days     Today's Date: 04/17/2017 SLP Individual Time: 1305-1400 SLP Individual Time Calculation (min): 55 min   Problem List:  Patient Active Problem List   Diagnosis Date Noted  . Supratherapeutic INR   . Pain   . Atrial fibrillation ([place])   . Benign essential HTN   . Anemia of chronic disease   . Diabetes mellitus type 2 in nonobese (HCC)   . Thrombocytopenia ([place])   . Hepatic encephalopathy ([place]) 04/16/2017  . Confusion 04/08/2017  . Acute hepatic encephalopathy 04/08/2017  . ESRD on dialysis ([place]) 04/08/2017  . Chronic systolic CHF (congestive heart failure) ([place]) 09/26/2016  . Right heart failure ([place]) 06/26/2016  . Leg wound, left 06/26/2016  . Chronic diastolic CHF (congestive heart failure) ([place]) 03/22/2016  . Ascites 12/02/2015  . Diarrhea 12/02/2015  . Leg cramps 12/02/2015  . Acute on chronic diastolic CHF (congestive heart failure), NYHA class 1 ([place])   . Diastolic dysfunction 40/98/1191  . Dyspnea   . CHF (congestive heart failure) ([place]) 09/06/2015  . CAD (coronary artery disease) 05/11/2015  . S/P placement of cardiac pacemaker 05/10/2015  . Acute on chronic diastolic CHF (congestive heart failure) ([place]) 05/10/2015  . Cardiac device in situ   . Sick sinus syndrome ([place]) 05/05/2015  . Acute on chronic renal failure ([place]) 03/23/2015  . Bradycardia, sinus, persistent, severe 03/23/2015  . Acute hyponatremia 03/23/2015  . Hyperkalemia 03/23/2015  . Rib pain on right side 03/23/2015  . Poor appetite 03/23/2015  . Acute gastrointestinal bleeding 03/23/2015  . Nausea & vomiting 03/23/2015  . H/O aortic valve replacement with tissue graft 12/09/2014  . Acute posthemorrhagic anemia 06/01/2014  . UTI (urinary tract infection) 05/31/2014  . Anxiety  05/31/2014  . Insomnia 05/31/2014  . Weakness 05/26/2014  . Fever 05/26/2014  . OA (osteoarthritis) of knee 05/24/2014  . Cataract 09/02/2013  . Osteoarthritis 09/02/2013  . Atrial flutter ([place]) 08/20/2012  . Back pain 08/20/2012  . Severe tricuspid regurgitation by prior echocardiogram 08/14/2011  . Aortic valve replaced 08/14/2011  . Chronic atrial fibrillation ([place]) 07/05/2011  . Chronic kidney disease (CKD), stage III (moderate)   . Hx of CABG 04/26/2011  . Diabetes ([place]) 04/26/2011  . Gout 04/26/2011  . BPH (benign prostatic hyperplasia) 04/26/2011  . Essential hypertension 04/26/2011  . MYOCARDIAL INFARCTION 11/27/2010  . Ischemic heart disease 11/27/2010  . Valvular heart disease 11/27/2010  . Sarcoidosis 11/24/2010  . Obstructive sleep apnea 11/24/2010  . Aortic valve disorder 11/24/2010  . DYSPNEA 11/24/2010   Past Medical History:  Past Medical History:  Diagnosis Date  . A-fib ([place])    permanent with tachy-brady syndrome  . Anemia   . Anxiety   . Atrial flutter (HCC)    hx of  . Biceps tendon tear    right  . Bone marrow disease   . CHF (congestive heart failure) ([place])   . Chronic kidney disease (CKD), stage III (moderate)    lov note dr Koleen Nimrod nephrology 09-17-13 on chart  . Complication of anesthesia 11-16-13   required alot of versed per anesthesia with cataract surgery  . Coronary heart disease    stent, CABG, RBBB  . DDD (degenerative disc disease)   . Diabetes ([place])   . Diabetes mellitus   . Fungal toenail infection   .  Gout   . Hemorrhagic cystitis 2012  . Hepatitis    mono  . HTN (hypertension)   . Hypercalcemia 2011   due to sarcoidiosis  . Myocardial infarction [facility])    1995, 2002, 2006  . OSA (obstructive sleep apnea)    use bipap setting of 10 and 12  . Osteoarthritis   . Pneumonia 1966, 2011   hx of  . Presence of permanent cardiac pacemaker   . Right sciatic nerve pain    for block thursday 01-21-2014, injections  . Sarcoid  2011   pulmonary and bone marrow  . Shortness of breath dyspnea   . Synovial cyst of lumbar spine    l3-l4, l4-l5, injections  . Valvular heart disease    aortic stenosis/regurgitation   Past Surgical History:  Past Surgical History:  Procedure Laterality Date  . BASAL CELL CARCINOMA EXCISION Right 2015   ear  . CARDIAC CATHETERIZATION N/A 12/10/2016   Procedure: Right Heart Cath;  Surgeon: Jolaine Artist, [name];  Location: [facility];  Service: Cardiovascular;  Laterality: N/A;  . CARDIAC VALVE REPLACEMENT    . CARDIOVERSION N/A 12/30/2013   Procedure: CARDIOVERSION;  Surgeon: [name], [name];  Location: [facility] ENDOSCOPY;  Service: Cardiovascular;  Laterality: N/A;  10:49 cardioversion at 120 joules, then 150 joules, to SB  used  Lido 44m,  Propofol 160 mcg  . CARDIOVERSION  2003, 2006, 2012, 2013  . cataract surgery Right 2007  . cataract surgery Left 11-16-13  . COLONOSCOPY N/A 01/29/2014   Procedure: COLONOSCOPY;  Surgeon: JWinfield Cunas, [name];  [facility];  Service: Endoscopy;  Laterality: N/A;  amanda//ja  . CORONARY ARTERY BYPASS GRAFT  2006   x 5  . EP IMPLANTABLE DEVICE N/A 05/05/2015   Procedure: Pacemaker Implant;  Surgeon: JThompson Grayer [name];  [facility];  Service: Cardiovascular;  Laterality: N/A;  . FLEXIBLE SIGMOIDOSCOPY N/A 12/07/2015   Procedure: FLEXIBLE SIGMOIDOSCOPY;  Surgeon: VWilford Corner [name];  [facility];  Service: Endoscopy;  Laterality: N/A;  Unprepped  . HERNIA REPAIR    . INSERT / REPLACE / REMOVE PACEMAKER  05/05/2015  . PORTACATH PLACEMENT    . portacath removed    . Redo Median sternotomy, extracorporeal cirulation, AVR, Tricuspid valve repair  06/13/2011   AVR(23-mm Edwards pericardial Magna-Ease valve./ TVrepair (34-mm Edwards MC3 annuloplasty ring  . TOTAL KNEE ARTHROPLASTY Right 05/24/2014   Procedure: RIGHT TOTAL KNEE ARTHROPLASTY;  Surgeon: FGearlean Alf [name];  Location: [facility];  Service:  Orthopedics;  Laterality: Right;  . TRANSURETHRAL RESECTION OF PROSTATE  oct. 2012   TURP  . 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 Per chart review patient lives with spouse independent with assistive device prior to admission but generalized decline over the past few weeks and has been essentially bedbound. 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 Presented 04/08/2017 with altered mental status, scrotal edema and abdominal discomfort. No  chest pain. Sodium 129, BUN 133, creatinine 4.3, ammonia 79. Started on lactulose. Noted recent paracentesis 03/06/2017 for hepatopathy with 2 L yield Hemodialysis/CCPD ongoing. Follow-up Gen. surgery for scrotal edema no active bleeding with conservative care.WOC follow-up for left dorsal hand wound from a dog scratch with skin care as directed. Lactulose adjusted latest ammonia level of 77.Xarelto was not recommended while with dialysis and switched to Coumadin. Physical occupational therapy evaluations completed. Patient was admitted for a comprehensive rehabilitation program on 04/16/2017.  SLP evaluation was completed on 04/17/2017 with the following results:   Pt presents with mild higher level cognitive impairment characterized by decreased awareness of deficits, decreased functional problem solving, and decreased recall of new information.   Question whether deficits are primary versus secondary to fatigue and medical complexity.  Pt would benefit from skilled ST while inpatient in order for ongoing diagnostic treatment of cognition in order to maximize functional independence and reduce burden of care prior to discharge   Skilled Therapeutic Interventions          Cognitive-linguistic evaluation completed with results and recommendations reviewed with family.  SLP administered portions of the CLQT with deficits noted in the areas mentioned above.  Pt needed min assist for problem solving as well as to recognize and correct errors in the moment.  Pt was noted to rely on his wife for recall of daily information.  Discussed role of SLP and interventions for maximizing return to previous level of function.  Pt agreeable with recommendations for ST follow up.      SLP Assessment  Patient will need skilled [facility] during CIR admission    Recommendations  Patient destination: Home Follow up Recommendations: Other (comment) (TBD) Equipment Recommended: None recommended by SLP    SLP Frequency 3 to 5 out of 7 days   SLP Duration  SLP Intensity  SLP Treatment/Interventions 14-18 days   Minumum of 1-2 x/day, 30 to 90 minutes  Cognitive remediation/compensation;Cueing hierarchy;Internal/external aids;Patient/family education    Pain Pain Assessment Pain Assessment: No/denies pain  Prior Functioning Cognitive/Linguistic Baseline: Within functional limits Type of Home: House  Lives With: Spouse Available Help at Discharge: Family;Available 24 hours/day;Friend(s) Education: [name] Vocation: Retired  Function:  Eating Eating                 Cognition Comprehension Comprehension assist level: Understands complex 90% of the time/cues 10% of the time  Expression   Expression assist level: Expresses complex 90% of the time/cues < 10% of the time  Social Interaction Social Interaction assist level:  Interacts appropriately 90% of the time - Needs monitoring or encouragement for participation or interaction.  Problem Solving Problem solving assist level: Solves basic 75 - 89% of the time/requires cueing 10 - 24% of the time  Memory Memory assist level: Recognizes or recalls 75 - 89% of the time/requires cueing 10 - 24% of the time   Short Term Goals: Week 1: SLP Short Term Goal 1 (Week 1): Pt will recall complex, new information with supervision verbal cues for use of external aids.   SLP Short Term Goal 2 (Week 1): Pt will complete complex tasks with supervision verbal cues for functional problem solving.   SLP Short Term Goal 3 (Week 1): Pt will recognize and correct errors during functional tasks with supervision verbal cues.    Refer to Care Plan for Long Term Goals  Recommendations for other services: None   Discharge Criteria: Patient will be discharged from SLP if patient  refuses treatment 3 consecutive times without medical reason, if treatment goals not met, if there is a change in medical status, if patient makes no progress towards goals or if patient is discharged from hospital.  The above assessment, treatment plan, treatment alternatives and goals were discussed and mutually agreed upon: by patient  Camala Talwar, Selinda Orion 04/17/2017, 3:12 PM

## 2017-04-17 NOTE — Progress Notes (Signed)
Initial Nutrition Assessment  DOCUMENTATION CODES:   Obesity unspecified  INTERVENTION:  Provide Nepro Shake po BID, each supplement provides 425 kcal and 19 grams protein.  Encourage adequate PO intake.   NUTRITION DIAGNOSIS:   Increased nutrient needs related to chronic illness as evidenced by estimated needs.  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 Presented 04/08/2017 with altered mental status, scrotal edema and abdominal discomfort. Hemodialysis/CCPD ongoing.  Pt was unavailable during attempted time of visit. RD unable to obtain nutrition history. Unable to complete Nutrition-Focused physical exam at this time. RD to perform physical exam at next visit. Meal completion has been 50%. RD to order nutritional supplements to aid in caloric and protein needs.   Labs and medications reviewed.   Diet Order:  Diet Carb Modified Fluid consistency: Thin; Room service appropriate? Yes  Skin:   (Incision on abdomen)  Last BM:  4/24  Height:   Ht Readings from Last 1 Encounters:  04/16/17 5\' 11"  (1.803 m)    Weight:   Wt Readings from Last 1 Encounters:  04/17/17 225 lb 8.5 oz (102.3 kg)    Ideal Body Weight:  78 kg  BMI:  Body mass index is 31.46 kg/m.  Estimated Nutritional Needs:   Kcal:  2150-2350  Protein:  110-130 grams  Fluid:  Per MD  EDUCATION NEEDS:   No education needs  identified at this time  Corrin Cordaro, MS, RD, LDN Pager # [phone] After hours/ weekend pager # [phone]

## 2017-04-18 ENCOUNTER — Inpatient Hospital Stay (HOSPITAL_COMMUNITY): Payer: Medicare Other | Admitting: Physical Therapy

## 2017-04-18 ENCOUNTER — Inpatient Hospital Stay (HOSPITAL_COMMUNITY): Payer: Medicare Other | Admitting: Occupational Therapy

## 2017-04-18 ENCOUNTER — Encounter: Payer: Self-pay | Admitting: Vascular Surgery

## 2017-04-18 ENCOUNTER — Inpatient Hospital Stay (HOSPITAL_COMMUNITY): Payer: Medicare Other | Admitting: Speech Pathology

## 2017-04-18 DIAGNOSIS — E1169 Type 2 diabetes mellitus with other specified complication: Secondary | ICD-10-CM

## 2017-04-18 DIAGNOSIS — E46 Unspecified protein-calorie malnutrition: Secondary | ICD-10-CM

## 2017-04-18 DIAGNOSIS — E669 Obesity, unspecified: Secondary | ICD-10-CM

## 2017-04-18 LAB — COMPREHENSIVE METABOLIC PANEL
ALK PHOS: 94 U/L (ref 38–126)
ALT: 12 U/L — ABNORMAL LOW (ref 17–63)
ANION GAP: 9 (ref 5–15)
AST: 19 U/L (ref 15–41)
Albumin: 2.4 g/dL — ABNORMAL LOW (ref 3.5–5.0)
BILIRUBIN TOTAL: 1.8 mg/dL — AB (ref 0.3–1.2)
BUN: 53 mg/dL — ABNORMAL HIGH (ref 6–20)
CO2: 29 mmol/L (ref 22–32)
Calcium: 8.8 mg/dL — ABNORMAL LOW (ref 8.9–10.3)
Chloride: 99 mmol/L — ABNORMAL LOW (ref 101–111)
Creatinine, Ser: 2.08 mg/dL — ABNORMAL HIGH (ref 0.61–1.24)
GFR, EST AFRICAN AMERICAN: 34 mL/min — AB (ref >60)
GFR, EST NON AFRICAN AMERICAN: 29 mL/min — AB (ref >60)
Glucose, Bld: 203 mg/dL — ABNORMAL HIGH (ref 65–99)
Potassium: 3.8 mmol/L (ref 3.5–5.1)
Sodium: 137 mmol/L (ref 135–145)
TOTAL PROTEIN: 5.5 g/dL — AB (ref 6.5–8.1)

## 2017-04-18 LAB — CBC
HCT: 39.8 % (ref 39.0–52.0)
HEMOGLOBIN: 13.1 g/dL (ref 13.0–17.0)
MCH: 31.5 pg (ref 26.0–34.0)
MCHC: 32.9 g/dL (ref 30.0–36.0)
MCV: 95.7 fL (ref 78.0–100.0)
PLATELETS: 87 10*3/uL — AB (ref 150–400)
RBC: 4.16 MIL/uL — AB (ref 4.22–5.81)
RDW: 17.2 % — ABNORMAL HIGH (ref 11.5–15.5)
WBC: 4.6 10*3/uL (ref 4.0–10.5)

## 2017-04-18 LAB — GLUCOSE, CAPILLARY
GLUCOSE-CAPILLARY: 202 mg/dL — AB (ref 65–99)
GLUCOSE-CAPILLARY: 81 mg/dL (ref 65–99)
Glucose-Capillary: 121 mg/dL — ABNORMAL HIGH (ref 65–99)
Glucose-Capillary: 130 mg/dL — ABNORMAL HIGH (ref 65–99)

## 2017-04-18 LAB — PROTIME-INR
INR: 3.23
Prothrombin Time: 33.7 seconds — ABNORMAL HIGH (ref 11.4–15.2)

## 2017-04-18 LAB — PHOSPHORUS: PHOSPHORUS: 3.4 mg/dL (ref 2.5–4.6)

## 2017-04-18 MED ORDER — GLIMEPIRIDE 2 MG PO TABS
1.0000 mg | ORAL_TABLET | Freq: Every day | ORAL | Status: DC
  Filled 2017-04-18: qty 1

## 2017-04-18 MED ORDER — PRO-STAT SUGAR FREE PO LIQD
30.0000 mL | Freq: Two times a day (BID) | ORAL | Status: DC
  Filled 2017-04-18 (×9): qty 30

## 2017-04-18 NOTE — Progress Notes (Signed)
1253 04/18/17 nursing Patient and wife refuses oral diabetic meds because per wife patient had  bad reactions to it ; per wife  they control his  blood sugar using insulin.

## 2017-04-18 NOTE — Progress Notes (Signed)
ANTICOAGULATION CONSULT NOTE - Follow Up Consult  Pharmacy Consult for warfarin Indication: atrial fibrillation  Patient Measurements: Height: 5\' 11"  (180.3 cm) Weight: 223 lb 1.7 oz (101.2 kg) IBW/kg (Calculated) : 75.3  Vital Signs: Temp: 97.7 F (36.5 C) (04/26 0734) Temp Source: Oral (04/26 0734) BP: 144/52 (04/26 0832) Pulse Rate: 62 (04/26 0832)  Labs:  Recent Labs  04/16/17 0245 04/17/17 0512 04/18/17 0610  HGB 12.3*  --  13.1  HCT 37.0*  --  39.8  PLT 82*  --  87*  LABPROT 28.2* 33.2* 33.7*  INR 2.59 3.17 3.23  CREATININE 2.12*  --  2.08*    Assessment: 32 YOM with CKD V/new ESRD with peritoneal dialysis initialed on 04/09/17. The patient was on Xarelto 15 mg once daily PTA for anticoagulation however with new ESRD/PD patient was transitioned to warfarin. Last dose of Xarelto was 4/16 in the evening.  INR on admission (4/16) was 2- due to influence of Xarelto. INR stayed stable until 4/19 when it jumped to 3.9. INR peaked at 5.04 on 4/20.  INR is 3.23 today, supratherapeutic and trending up slightly although only 1 mg was given yesterday. Likely only require ~ 1-2mg  daily  Hgb 13.1, plts chronically low but trending up(currently 87). No bleeding noted.   Goal of Therapy:  INR 2-3 Monitor platelets by anticoagulation protocol: Yes   Plan:  - No coumadin tonight - Daily INR while inpatient - Follow for s/s bleeding  Maryanna Shape, PharmD, BCPS  Clinical Pharmacist  Pager: [phone]   04/18/2017 1:29 PM

## 2017-04-18 NOTE — Progress Notes (Signed)
Patient ID: [name], MD, male   DOB: 1941/12/06, 76 y.o.   MRN: [id number]  [facility] Progress Note    Subjective:   "Had the best night of sleep this whole hospitalization".  No issues with CCPD last night.   Objective:   BP 140/65 (BP Location: Right Arm)   Pulse (!) 57   Temp 97.7 F (36.5 C) (Oral)   Resp 18   Ht 5\' 11"  (1.803 m)   Wt 101.2 kg (223 lb 1.7 oz)   SpO2 100%   BMI 31.12 kg/m   Intake/Output: I/O last 3 completed shifts: In: 8055 [P.O.:480; Other:7575] Out: 7858 [Urine:650; IFOYD:7412]   Intake/Output this shift:  No intake/output data recorded. Weight change: -0.7 kg (-1 lb 8.7 oz)  Physical Exam: Gen:WD WN in NAD CVS:no rub, II/VI SEM, +metallic click Resp:decreased BS at bases INO:[id number], nontender Ext:1+ edema  Labs: BMET  Recent Labs Lab 04/11/17 0946 04/13/17 0310 04/14/17 0629 04/16/17 0245 04/18/17 0610  NA 131* 133* 132* 136 137  K 3.3* 2.8* 2.9* 3.4* 3.8  CL 93* 94* 94* 98* 99*  CO2 28 28 29 27 29   GLUCOSE 156* 190* 132* 219* 203*  BUN 92* 82* 73* 67* 53*  CREATININE 3.03* 2.63* 2.53* 2.12* 2.08*  ALBUMIN 2.5*  --   --  2.4* 2.4*  CALCIUM 8.6* 8.5* 8.3* 8.4* 8.8*  PHOS 4.4  --   --  3.1 3.4   CBC  Recent Labs Lab 04/12/17 0435 04/16/17 0245 04/18/17 0610  WBC 5.9 5.9 4.6  HGB 12.5* 12.3* 13.1  HCT 36.2* 37.0* 39.8  MCV 91.9 93.7 95.7  PLT 76* 82* 87*    @IMGRELPRIORS @ Medications:    . allopurinol  100 mg Oral Q breakfast  . feeding supplement (NEPRO CARB STEADY)  237 mL Oral BID BM  . insulin aspart  0-15 Units Subcutaneous TID WC  . lactulose  20 g Oral TID  . loratadine  10 mg Oral Daily  . metoprolol  50 mg Oral BID  . mupirocin cream   Topical BID  . potassium chloride  40 mEq Oral BID  . terazosin  5 mg Oral BH-[id number]  . torsemide  40 mg Oral Daily  . Warfarin - Pharmacist Dosing Inpatient   Does not apply [id number]     Assessment/ Plan:    1. Right sided CHF with scrotal  swelling. CT scan negative for hernia or leak 2. ESRD doing well with CCPD. tolerating recent increase of volume to 1500 ml to improve clearance and increase fill time to help with abdominal pain/cramping. I spoke with Dr. Raul Del regarding removing the sutures from the surgery and will wait until early next week due to ascites and hernia repair.  1. Did well with increased drain time and with new cycler as the old one alarmed all night.  His last drain was markedly improved.   2. Will increase fill volume to 2 liters tomorrow night as he is 2 weeks out from PD catheter placement 3. Markedly improved clearances 3. Anemia: stable 4. CKD-MBD: stable 5. Nutrition: renal diet, low K cont with supplementation and change diet without potassium restriction 6. Hypertension: stable 7. Hepatic encephalopathy- presumably related to right heart failure continue with lactulose 8. Deconditioning- PT/OT and CIR when bed available Donetta Potts, MD [place] Pager [phone] 04/18/2017, 8:19 AM

## 2017-04-18 NOTE — Progress Notes (Addendum)
[place] PHYSICAL MEDICINE & REHABILITATION     PROGRESS NOTE  Subjective/Complaints:  Pt seen laying in bed this AM.  He states he had the best nights sleep he has had since being in the hospital.    ROS: Denies CP, SOB, N/V/D.  Objective: Vital Signs: Blood pressure (!) 144/52, pulse 62, temperature 97.7 F (36.5 C), temperature source Oral, resp. rate 18, height 5\' 11"  (1.803 m), weight 101.2 kg (223 lb 1.7 oz), SpO2 100 %. No results found.  Recent Labs  04/16/17 0245 04/18/17 0610  WBC 5.9 4.6  HGB 12.3* 13.1  HCT 37.0* 39.8  PLT 82* 87*    Recent Labs  04/16/17 0245 04/18/17 0610  NA 136 137  K 3.4* 3.8  CL 98* 99*  GLUCOSE 219* 203*  BUN 67* 53*  CREATININE 2.12* 2.08*  CALCIUM 8.4* 8.8*   CBG (last 3)   Recent Labs  04/17/17 1654 04/17/17 2057 04/18/17 0621  GLUCAP 142* 137* 202*    Wt Readings from Last 3 Encounters:  04/18/17 101.2 kg (223 lb 1.7 oz)  04/16/17 99.8 kg (220 lb 0.3 oz)  03/02/17 99.8 kg (220 lb)    Physical Exam:  BP (!) 144/52   Pulse 62   Temp 97.7 F (36.5 C) (Oral)   Resp 18   Ht 5\' 11"  (1.803 m)   Wt 101.2 kg (223 lb 1.7 oz)   SpO2 100%   BMI 31.12 kg/m  Constitutional: He appears well-developed. NAD. HENT: Normocephalic and atraumatic.  Eyes: EOMI. No discharge.  Cardiovascular: IRRR. No JVD. +Murmur. Respiratory: Effort normal and breath sounds normal.  GI: Soft. Bowel sounds are normal.  PD catheter noted.  Neurological:  Follows simple commands.  STM deficits.  Motor: 4-/5 throughout  Skin.warm and dry. Dry dressing to LUE.  Vascular changes b/l LE Incisions on abdomen with sutures/intact.  Some scattered bruising around abdomen.  Psych: Normal mood and affect.  Assessment/Plan: 1. Functional deficits secondary to debility which require 3+ hours per day of interdisciplinary therapy in a comprehensive inpatient rehab setting. Physiatrist is providing close team supervision and 24 hour management of  active medical problems listed below. Physiatrist and rehab team continue to assess barriers to discharge/monitor patient progress toward functional and medical goals.  Function:  Bathing Bathing position   Position: Sitting EOB  Bathing parts Body parts bathed by patient: Right arm, Left arm, Chest, Abdomen, Right upper leg, Left upper leg Body parts bathed by helper: Right lower leg, Left lower leg, Buttocks, Front perineal area, Back  Bathing assist Assist Level: 2 helpers      Upper Body Dressing/Undressing Upper body dressing   What is the patient wearing?: Pull over shirt/dress     Pull over shirt/dress - Perfomed by patient: Thread/unthread right sleeve, Thread/unthread left sleeve, Put head through opening Pull over shirt/dress - Perfomed by helper: Pull shirt over trunk        Upper body assist        Lower Body Dressing/Undressing Lower body dressing   What is the patient wearing?: Pants, Non-skid slipper socks   Underwear - Performed by helper: Thread/unthread right underwear leg, Thread/unthread left underwear leg, Pull underwear up/down       Non-skid slipper socks- Performed by helper: Don/doff right sock, Don/doff left sock                  Lower body assist Assist for lower body dressing: 2 Medical sales representative  Toileting assist     Transfers Chair/bed transfer   Chair/bed transfer method: Stand pivot Chair/bed transfer assist level: Moderate assist (Pt 50 - 74%/lift or lower) Chair/bed transfer assistive device: Medical sales representative     Max distance: 3 ft Assist level: Moderate assist (Pt 50 - 74%)   Wheelchair   Type: Manual Max wheelchair distance: 50 ft Assist Level: Touching or steadying assistance (Pt > 75%)  Cognition Comprehension Comprehension assist level: Understands complex 90% of the time/cues 10% of the time  Expression Expression assist level: Expresses complex 90% of the time/cues <  10% of the time  Social Interaction Social Interaction assist level: Interacts appropriately 90% of the time - Needs monitoring or encouragement for participation or interaction.  Problem Solving Problem solving assist level: Solves basic 75 - 89% of the time/requires cueing 10 - 24% of the time  Memory Memory assist level: Recognizes or recalls 75 - 89% of the time/requires cueing 10 - 24% of the time    Medical Problem List and Plan:  1. Weakness with cognitive deficits secondary to hepatic encephalopathy and multiple medical issues   Cont CIR 2. DVT Prophylaxis/Anticoagulation: Chronic Coumadin. Monitor for rebleeding episodes   INR supratherapeutic 4/26 3. Pain Management: Valium 10 mg daily as needed, Ultram 100 mg every 6 hours as needed  4. Mood: Provide emotional support  5. Neuropsych: This patient is not fully capable of making decisions on his own behalf.  6. Skin/Wound Care: Cleanse wounds to left hand Apply Bactroban Vaseline gauze for atraumatic dressing removed. Wrap with Kerlix changed daily  7. Fluids/Electrolytes/Nutrition: Routine I&Os 8. ESRD/CPPD. Status post peritoneal dialysis catheter 04/05/2017. Follow-up renal services  9. 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 Monitor for any signs of fluid  Filed Weights   04/17/17 1015 04/17/17 1403 04/18/17 0734  Weight: 101.6 kg (223 lb 15.8 oz) 102.3 kg (225 lb 8.5 oz) 101.2 kg (223 lb 1.7 oz)   10. 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 Status post paracentesis 03/06/2017. Continue lactulose.   Ammonia 60 on 4/25 11. Atrial fibrillation. Continue Coumadin. Cardiac rate control  12. Hypertension. Lopressor 50 mg twice a day, Hytrin 5 mg daily, Demadex 40 mg daily   Monitor with increased mobility  Relatively controlled 4/26 13. Anemia of chronic disease.   Hb 12.3 on 4/24  Cont to monitor 14. History of gout. Allopurinol 100 mg daily. Monitor for any gout flareups  15.  DM type 2  Monitor with increased mobility  Amaryl 1mg  started 4/26 16. Thrombocytopenia  Plts 87 on 4/26  Cont to monitor 17. Hypoalbuminemia  Supplement initiated 4/26 18. Obesity  Body mass index is 31.12 kg/m.  Diet and exercise education  Encourage weight loss to increase endurance and promote overall health   LOS (Days) 2 A FACE TO FACE EVALUATION WAS PERFORMED  Nixon Sparr Lorie Phenix 04/18/2017 8:42 AM

## 2017-04-18 NOTE — Progress Notes (Signed)
Rt note:  PT has home CPAP @ bedside and puts on when he is ready for bed. RT available if needed.

## 2017-04-18 NOTE — Progress Notes (Signed)
Speech Language Pathology Daily Session Note  Patient Details  Name: [name] DROST, MD MRN: [id number] Date of Birth: 05/20/1941  Today's Date: 04/18/2017 SLP Individual Time: 1400-1500 SLP Individual Time Calculation (min): 60 min  Short Term Goals: Week 1: SLP Short Term Goal 1 (Week 1): Pt will recall complex, new information with supervision verbal cues for use of external aids.   SLP Short Term Goal 2 (Week 1): Pt will complete complex tasks with supervision verbal cues for functional problem solving.   SLP Short Term Goal 3 (Week 1): Pt will recognize and correct errors during functional tasks with supervision verbal cues.    Skilled Therapeutic Interventions:  Pt was seen for skilled ST targeting ongoing diagnostic treatment of cognition.  SLP administered remaining portions of the CLQT from yesterday's evaluation with pt demonstrating scores below the cut off on clock drawing, symbol trails, and design generation subtests which is further indicative of problem solving/executive functioning impairment.  Furthermore, pt appeared to have decreased error/emergent awareness of task difficulty, needing min-mod cues to ask for help on assessment measures when taking longer than a reasonable amount of time.  Discussed findings with pt who is in agreement with ongoing ST interventions.  Will plan to address medication management at next available appointment.  Continue per current plan of care.       Function:  Eating Eating               Cognition Comprehension Comprehension assist level: Understands complex 90% of the time/cues 10% of the time  Expression   Expression assist level: Expresses complex 90% of the time/cues < 10% of the time  Social Interaction Social Interaction assist level: Interacts appropriately 75 - 89% of the time - Needs redirection for appropriate language or to initiate interaction.  Problem Solving Problem solving assist level: Solves basic 50 - 74% of  the time/requires cueing 25 - 49% of the time  Memory Memory assist level: Recognizes or recalls 75 - 89% of the time/requires cueing 10 - 24% of the time    Pain Pain Assessment Pain Assessment: No/denies pain  Therapy/Group: Individual Therapy  [name], Selinda Orion 04/18/2017, 4:03 PM

## 2017-04-18 NOTE — Progress Notes (Signed)
Physical Therapy Session Note  Patient Details  Name: [name] YSAGUIRRE, MD MRN: [id number] Date of Birth: [birth year]  Today's Date: 04/18/2017 PT Individual Time: 1005-1030 PT Individual Time Calculation (min): 25 min   Short Term Goals: Week 1:  PT Short Term Goal 1 (Week 1): Pt to sit X 5 minutes without UE support. PT Short Term Goal 2 (Week 1): Pt to perform sit<>stand transfers with min assist. PT Short Term Goal 3 (Week 1): Pt to ambulate 25 ft with rw.  PT Short Term Goal 4 (Week 1): Pt to perform supine>sitting with min assist.    Skilled Therapeutic Interventions/Progress Updates:   Patient in wheelchair upon arrival with wife present to observe therapy. Focus on wheelchair propulsion using BUE with max verbal/visual cues for efficient propulsion technique with supervision-min A for steering x 100 ft with increased time, sit <> stand with BUE support with steady assist x 1 progressed to supervision for rest of session, gait training using RW x 50 ft with steady assist progressed to supervision, and stair training up/down 12 stairs using 2 rails with step-to pattern with close supervision. Patient performed ambulatory transfer back to wheelchair using RW with supervision and left sitting in wheelchair with all needs in reach and wife present.    Therapy Documentation Precautions:  Precautions Precautions: Fall Precaution Comments: scrotal swelling Restrictions Weight Bearing Restrictions: No Pain: Pain Assessment Pain Assessment: 0-10 Pain Score: 1  Pain Type: Acute pain Pain Location: Abdomen Pain Orientation: Right;Lower Pain Descriptors / Indicators: Dull;Tender Pain Onset: On-going Pain Intervention(s): Rest  See Function Navigator for Current Functional Status.   Therapy/Group: Individual Therapy  Katilyn Miltenberger, Murray Hodgkins 04/18/2017, 10:32 AM

## 2017-04-18 NOTE — Progress Notes (Signed)
04/18/17 1300 nursing Patient refuses prostat and nepro per patient it makes him sick.

## 2017-04-18 NOTE — Progress Notes (Signed)
Physical Therapy Session Note  Patient Details  Name: [name] VANDERBERG, MD MRN: [id number] Date of Birth: Jan 15, 1941  Today's Date: 04/18/2017 PT Individual Time: 1100-1202 PT Individual Time Calculation (min): 62 min   Short Term Goals: Week 1:  PT Short Term Goal 1 (Week 1): Pt to sit X 5 minutes without UE support. PT Short Term Goal 2 (Week 1): Pt to perform sit<>stand transfers with min assist. PT Short Term Goal 3 (Week 1): Pt to ambulate 25 ft with rw.  PT Short Term Goal 4 (Week 1): Pt to perform supine>sitting with min assist.    Skilled Therapeutic Interventions/Progress Updates:    Pt sitting in w/c upon arrival, agreeable to PT session. Pt states that he needs to use bathroom prior to walking. Pt able to ambulate into bathroom with min guard assist. On standard toilet, pt able to void and have BM. Pt able to use toilet paper for hygiene and PT assisting to finish in standing. Ambulating back out of bathroom to w/c where pt taking seated rest. Standing at sink following to wash Rt hand. Ambulation: pt ambulating 60 ft X1 and 125 ft X1(3 standing breaks) later in session using rw and min guard assist. Heavy reliance on UEs during ambulation. Bed mobility: supine>sitting supervision, sit>supine min assist/guard. Working on bridging for moving hips in bed. W/C-pt propelling w/c 50 ft with supervision on flat/level surface. Pt requests returning to bed after session due to fatigue. Pt left with all needs in reach and spouse present.   Therapy Documentation Precautions:  Precautions Precautions: Fall Precaution Comments: scrotal swelling Restrictions Weight Bearing Restrictions: No Pain: Denies pain.   See Function Navigator for Current Functional Status.   Therapy/Group: Individual Therapy  Linard Millers, PT 04/18/2017, 12:16 PM

## 2017-04-18 NOTE — Progress Notes (Signed)
Occupational Therapy Session Note  Patient Details  Name: [name] CRAIGHEAD, MD MRN: [id number] Date of Birth: [birth year]  Today's Date: 04/18/2017 OT Individual Time: 0900-1002 OT Individual Time Calculation (min): 62 min    Short Term Goals: Week 1:  OT Short Term Goal 1 (Week 1): Pt will maintain dynamic sitting EOB with supervison during performance of selfcare tasks.  OT Short Term Goal 2 (Week 1): Pt will complete sit to stand for LB bathing and dressing with min assist.  OT Short Term Goal 3 (Week 1): Pt will donn LB clothing over feet with supervision and use of AE. OT Short Term Goal 4 (Week 1): Pt will perform toilet transfer to the elevated toilet with min assist using the RW.  Skilled Therapeutic Interventions/Progress Updates:    Pt reported sleeping the best he's slept in the hospital last night.  Completed supine to sit EOB with mod assist, rolling to the right side and transitioning from sidelying to sitting.  Stand pivot transfer from EOB to the wheelchair with min assist as well.  He worked on upright sitting posture with supervision while engaged in bathing tasks.  Min assist for crossing the RLE over the left knee with max assist to cross the left over the right, when removing gripper socks and attempting to wash his feet.  He was able to thread the brief and his pants over his feet without use of AE but needed min assist for standing to pull them over his hips.  Mod instructional cueing needed to sequence all sit to stand transitions as well.  Utilized sock aide for donning gripper socks with min assist.  Pt left in wheelchair at end of session with spouse present and call button and phone in reach.  Discussed need for abdominal binder or device to hold catheter with nursing at conclusion of session.  Therapy Documentation Precautions:  Precautions Precautions: Fall Precaution Comments: scrotal swelling Restrictions Weight Bearing Restrictions: No  Pain: Pain  Assessment Pain Assessment: 0-10 Pain Score: 1  Pain Type: Acute pain Pain Location: Abdomen Pain Orientation: Right;Lower Pain Descriptors / Indicators: Dull;Tender Pain Onset: On-going Pain Intervention(s): Rest ADL: See Function Navigator for Current Functional Status.   Therapy/Group: Individual Therapy  Charmion Hapke OTR/L 04/18/2017, 12:37 PM

## 2017-04-19 ENCOUNTER — Inpatient Hospital Stay (HOSPITAL_COMMUNITY): Payer: Medicare Other | Admitting: Speech Pathology

## 2017-04-19 ENCOUNTER — Inpatient Hospital Stay (HOSPITAL_COMMUNITY): Payer: Medicare Other | Admitting: Physical Therapy

## 2017-04-19 ENCOUNTER — Inpatient Hospital Stay (HOSPITAL_COMMUNITY): Payer: Medicare Other | Admitting: Occupational Therapy

## 2017-04-19 LAB — PROTIME-INR
INR: 3.69
Prothrombin Time: 37.5 seconds — ABNORMAL HIGH (ref 11.4–15.2)

## 2017-04-19 LAB — AMMONIA: AMMONIA: 48 umol/L — AB (ref 9–35)

## 2017-04-19 LAB — RENAL FUNCTION PANEL
ALBUMIN: 2.5 g/dL — AB (ref 3.5–5.0)
ANION GAP: 8 (ref 5–15)
BUN: 47 mg/dL — ABNORMAL HIGH (ref 6–20)
CO2: 28 mmol/L (ref 22–32)
Calcium: 8.7 mg/dL — ABNORMAL LOW (ref 8.9–10.3)
Chloride: 99 mmol/L — ABNORMAL LOW (ref 101–111)
Creatinine, Ser: 2.03 mg/dL — ABNORMAL HIGH (ref 0.61–1.24)
GFR calc Af Amer: 35 mL/min — ABNORMAL LOW (ref >60)
GFR, EST NON AFRICAN AMERICAN: 30 mL/min — AB (ref >60)
Glucose, Bld: 166 mg/dL — ABNORMAL HIGH (ref 65–99)
PHOSPHORUS: 3.1 mg/dL (ref 2.5–4.6)
POTASSIUM: 4.1 mmol/L (ref 3.5–5.1)
Sodium: 135 mmol/L (ref 135–145)

## 2017-04-19 LAB — GLUCOSE, CAPILLARY
GLUCOSE-CAPILLARY: 177 mg/dL — AB (ref 65–99)
GLUCOSE-CAPILLARY: 122 mg/dL — AB (ref 65–99)
Glucose-Capillary: 115 mg/dL — ABNORMAL HIGH (ref 65–99)
Glucose-Capillary: 115 mg/dL — ABNORMAL HIGH (ref 65–99)

## 2017-04-19 MED ORDER — GENTAMICIN SULFATE 0.1 % EX CREA
1.0000 "application " | TOPICAL_CREAM | Freq: Every day | CUTANEOUS | Status: DC
  Administered 2017-04-23: 1 via TOPICAL
  Filled 2017-04-19: qty 15

## 2017-04-19 MED ORDER — HEPARIN 1000 UNIT/ML FOR PERITONEAL DIALYSIS: 500.0000 [IU] | INTRAMUSCULAR | Status: DC | PRN

## 2017-04-19 NOTE — Patient Care Conference (Signed)
Inpatient RehabilitationTeam Conference and Plan of Care Update Date: 04/17/2017   Time: 2:10 PM    Patient Name: [name] EMOND, MD      Medical Record Number: [id number]  Date of Birth: 1941-08-09 Sex: Male         Room/Bed: 4W06C/4W06C-01 Payor Info: Payor: MEDICARE / Plan: MEDICARE PART A AND B / Product Type: *No Product type* /    Admitting Diagnosis: ABD Pain  Admit Date/Time:  04/16/2017  7:34 PM Admission Comments: No comment available   Primary Diagnosis:  <principal problem not specified> Principal Problem: <principal problem not specified>  Patient Active Problem List   Diagnosis Date Noted  . Diabetes mellitus type 2 in obese ([place])   . Hypoalbuminemia due to protein-calorie malnutrition ([place])   . Supratherapeutic INR   . Pain   . Atrial fibrillation ([place])   . Benign essential HTN   . Anemia of chronic disease   . Diabetes mellitus type 2 in nonobese ([facility])   . Thrombocytopenia ([place])   . Hepatic encephalopathy ([place]) 04/16/2017  . Confusion 04/08/2017  . Acute hepatic encephalopathy 04/08/2017  . ESRD on dialysis ([place]) 04/08/2017  . Chronic systolic CHF (congestive heart failure) ([place]) 09/26/2016  . 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 Leg wound, left 06/26/2016  . Chronic diastolic CHF (congestive heart failure) ([place]) 03/22/2016  . Ascites 12/02/2015  . Diarrhea 12/02/2015  . Leg cramps 12/02/2015  . Acute on chronic diastolic CHF (congestive heart failure), NYHA class 1 ([place])   . Diastolic dysfunction 26/83/4196  . Dyspnea   . CHF (congestive heart failure) ([place]) 09/06/2015  . CAD (coronary artery disease) 05/11/2015  . S/P placement of cardiac pacemaker 05/10/2015  . Acute on chronic diastolic CHF (congestive heart failure) ([place]) 05/10/2015  . Cardiac device in situ   . Sick sinus syndrome ([place]) 05/05/2015  . Acute on chronic renal failure ([place]) 03/23/2015  . Bradycardia, sinus, persistent, severe 03/23/2015  . Acute hyponatremia 03/23/2015  .  Hyperkalemia 03/23/2015  . Rib pain on right side 03/23/2015  . Poor appetite 03/23/2015  . Acute gastrointestinal bleeding 03/23/2015  . Nausea & vomiting 03/23/2015  . H/O aortic valve replacement with tissue graft 12/09/2014  . Acute posthemorrhagic anemia 06/01/2014  . UTI (urinary tract infection) 05/31/2014  . Anxiety 05/31/2014  . Insomnia 05/31/2014  . Weakness 05/26/2014  . Fever 05/26/2014  . OA (osteoarthritis) of knee 05/24/2014  . Cataract 09/02/2013  . Osteoarthritis 09/02/2013  . Atrial flutter ([place]) 08/20/2012  . Back pain 08/20/2012  . Severe tricuspid regurgitation by prior echocardiogram 08/14/2011  . Aortic valve replaced 08/14/2011  . Chronic atrial fibrillation ([place]) 07/05/2011  . Chronic kidney disease (CKD), stage III (moderate)   . Hx of CABG 04/26/2011  . Diabetes ([place]) 04/26/2011  . Gout 04/26/2011  . BPH (benign prostatic hyperplasia) 04/26/2011  . Essential hypertension 04/26/2011  . MYOCARDIAL INFARCTION 11/27/2010  . Ischemic heart disease 11/27/2010  . Valvular heart disease 11/27/2010  . Sarcoidosis 11/24/2010  . Obstructive sleep apnea 11/24/2010  . Aortic valve disorder 11/24/2010  . DYSPNEA 11/24/2010    Expected Discharge Date: Expected Discharge Date: 05/01/17  Team Members Present: Physician leading conference: Dr. Delice Lesch Social Worker Present: Alfonse Alpers, LCSW Nurse Present: Heather Roberts, RN PT Present: Carney Living, PT OT Present: Clyda Greener, OT SLP Present: Windell Moulding, SLP PPS Coordinator present : Daiva Nakayama, RN, CRRN     Current Status/Progress Goal Weekly Team Focus  Medical   Weakness with  cognitive deficits secondary to hepatic encephalopathy and multiple medical issues   Improve mobility, safety, INR  See above   Bowel/Bladder   Incontinent of bowel and bladder, LBM 02-17-17,olguric  Continent and bowel and bladder  Assist with toileting need, maintain regular bowel pattern   Swallow/Nutrition/  Hydration             ADL's   mod to max assist for bathing, dressing, stand pivot transfers.  Decreased overall endurance for selfcare tasks.   supervision overall  selfcare retraining, balance training, transfer training, energy conservation strategies, pt/family education, DME education   Mobility   mod A for transfers and med mobility; self propelled w/c 50"; ambulated 3'  supervision overall  balance training, transfer training, energy conservation strategies, pt/family education   Communication             Safety/Cognition/ Behavioral Observations            Pain    Pain <4 with min assist  Pain < or = 3  Assess pain q shift and prn   Skin   8 abdominal incision with sutures ota, left hand dog scratches bactracin in use, left leg weeping, scrotal edema and weeping  skin healing and free from new breakdown/infection  Assess skin q shift, keep scrotum elevated, treatment as ordered    Rehab Goals Patient on target to meet rehab goals: Yes Rehab Goals Revised: none - first conference *See Care Plan and progress notes for long and short-term goals.  Barriers to Discharge: Safety, mobility, supratherapeutic INR, PD, hyperglycemia, thrombocytopenia    Possible Resolutions to Barriers:  Therapies, follow labs, adjust coumadin, adjust DM meds    Discharge Planning/Teaching Needs:  Pt to return to his home with his wife to provide supervision and assistance with PD.  Wife to receive family education closer to d/c.   Team Discussion:  MD is adjusting pt's coumadin to get therapeutic INR.  Pt is currently on sliding scale insulin and my need a maintenance medication for DM.  PD has had some issues, but nursing is working through these.  Pt should be off PD by 8AM, but no therapies to start until 9AM.  Wife and dtr are receptive to learning about PD.  Pt at min to mod A now with goals of supervision.  Pt may need further diagnostic testing if cognition doesn't clear per ST, but may just  be exhausted/medication related.  Cognition declines around 4/5 PM daily.  Supervision level goals.  Revisions to Treatment Plan:  No therapy to start until 9AM.   Continued Need for Acute Rehabilitation Level of Care: The patient requires daily medical management by a physician with specialized training in physical medicine and rehabilitation for the following conditions: Daily direction of a multidisciplinary physical rehabilitation program to ensure safe treatment while eliciting the highest outcome that is of practical value to the patient.: Yes Daily medical management of patient stability for increased activity during participation in an intensive rehabilitation regime.: Yes Daily analysis of laboratory values and/or radiology reports with any subsequent need for medication adjustment of medical intervention for : Neurological problems;Diabetes problems;Other  Adaria Hole, Silvestre Mesi 04/19/2017, 4:41 PM

## 2017-04-19 NOTE — Progress Notes (Signed)
Patient ID: [name], MD, male   DOB: 08-07-1941, 76 y.o.   MRN: [id number]  [facility] Progress Note    Subjective:   Feels well, no troubles overnight   Objective:   BP (!) 146/79 (BP Location: Right Arm)   Pulse 63   Temp 97.8 F (36.6 C) (Axillary)   Resp 18   Ht 5\' 11"  (1.803 m)   Wt 101.2 kg (223 lb 1.7 oz)   SpO2 99%   BMI 31.12 kg/m   Intake/Output: I/O last 3 completed shifts: In: 8229 [P.O.:710; TMLYY:5035] Out: 4656 [Urine:825; Other:8116]   Intake/Output this shift:  No intake/output data recorded. Weight change: -0.4 kg (-14.1 oz)  Physical Exam: Gen:WD NAD CVS:no rub Resp:cta CLE:XNTZ, NT/ND Ext:2+ edema  Labs: BMET  Recent Labs Lab 04/13/17 0310 04/14/17 0629 04/16/17 0245 04/18/17 0610  NA 133* 132* 136 137  K 2.8* 2.9* 3.4* 3.8  CL 94* 94* 98* 99*  CO2 28 29 27 29   GLUCOSE 190* 132* 219* 203*  BUN 82* 73* 67* 53*  CREATININE 2.63* 2.53* 2.12* 2.08*  ALBUMIN  --   --  2.4* 2.4*  CALCIUM 8.5* 8.3* 8.4* 8.8*  PHOS  --   --  3.1 3.4   CBC  Recent Labs Lab 04/16/17 0245 04/18/17 0610  WBC 5.9 4.6  HGB 12.3* 13.1  HCT 37.0* 39.8  MCV 93.7 95.7  PLT 82* 87*    @IMGRELPRIORS @ Medications:    . allopurinol  100 mg Oral Q breakfast  . feeding supplement (NEPRO CARB STEADY)  237 mL Oral BID BM  . feeding supplement (PRO-STAT SUGAR FREE 64)  30 mL Oral BID  . glimepiride  1 mg Oral Q breakfast  . insulin aspart  0-15 Units Subcutaneous TID WC  . lactulose  20 g Oral TID  . loratadine  10 mg Oral Daily  . metoprolol  50 mg Oral BID  . mupirocin cream   Topical BID  . potassium chloride  40 mEq Oral BID  . terazosin  5 mg Oral BH-q7a  . torsemide  40 mg Oral Daily  . Warfarin - Pharmacist Dosing Inpatient   Does not apply G0174     Assessment/ Plan:   1. Right sided CHF with scrotal swelling. CT scan negative for hernia or leak.  Slowly losing weight with UF.  Cont to monitor daily weights. 2. ESRD  doing well with CCPD. tolerating recent increase of volume to 1500 ml to improve clearance and increase fill time to help with abdominal pain/cramping. I spoke with Dr. Raul Del regarding removing the sutures from the surgery and will wait until early next week due to ascites and hernia repair.  1. Did well with increased draintime and with new cycler as the old one alarmed all night. His last drain was markedly improved.  2. Will increase fill volume to 2 liters tonight. 3. Should be able to tolerate ambulatory PD now that he is 2 weeks out from PD catheter placement 4. Markedly improved clearances 3. Anemia: stable 4. CKD-MBD: stable 5. Nutrition: renal diet, low K cont with supplementation and change diet without potassium restriction 6. Hypertension: stable 7. Hepatic encephalopathy- presumably related to right heart failure continue with lactulose 8. Deconditioning- PT/OT and able to ambulate yesterday  Donetta Potts, MD [place] Pager [phone] 04/19/2017, 8:23 AM

## 2017-04-19 NOTE — Progress Notes (Signed)
RT NOTE:  Pt has home CPAP @ bedside. No respiratory assistance needed @ this time.

## 2017-04-19 NOTE — Progress Notes (Signed)
Social Work Patient ID: [name] Fiddler, MD, male   DOB: 1941/04/14, 76 y.o.   MRN: [id number]   CSW met with pt and his wife 06-18-17 to update them on team conference discussion and to let them know of targeted d/c date of 05-01-17.  They were pleased with that.  Wife has already begin physical and mental preparations on caring for pt at home with PD.  She also discussed home DME needs with CSW and that she would be happy to use [facility] again for [facility] f/u for nursing and therapies.  CSW will continue to follow and assist as needed.

## 2017-04-19 NOTE — Progress Notes (Signed)
Occupational Therapy Session Note  Patient Details  Name: [name] CASAGRANDE, MD MRN: [id number] Date of Birth: 11-Jun-1941  Today's Date: 04/19/2017 OT Individual Time: 9628-3662 OT Individual Time Calculation (min): 58 min    Short Term Goals: Week 1:  OT Short Term Goal 1 (Week 1): Pt will maintain dynamic sitting EOB with supervison during performance of selfcare tasks.  OT Short Term Goal 2 (Week 1): Pt will complete sit to stand for LB bathing and dressing with min assist.  OT Short Term Goal 3 (Week 1): Pt will donn LB clothing over feet with supervision and use of AE. OT Short Term Goal 4 (Week 1): Pt will perform toilet transfer to the elevated toilet with min assist using the RW.  Skilled Therapeutic Interventions/Progress Updates:    Pt transitioned to sitting from supine with min assist, rolling to the right side and utilizing bed rail for support.  Min assist for stand pivot transfer to the wheelchair from bed.  Worked on donning pants and shirts from wheelchair.  Min assist for pulling shirt down in the back with mod assist for threading LEs into pants.  Increased assist needed for this secondary to gripper socks already being on and increasing friction.  Transitioned down to the therapy gym for second part of session.  Had pt focus on sit to stand transitions from [facility] with incorporation of sit to squat as well as reaching activity.  Pt fatigues quickly during standing tasks with endurance limited to less than 3 mins.  Min assist for sit to stand and squat to stand from higher surface.  Oxygen sats maintained at 96% or higher with HR not elevating about 68.  Had pt work on peg reproduction activity with large pegs while standing and having to reach down to pick them up.  Pt needed mod questioning cueing to realize mistakes during task.  Finished session with pt sitting on mat and PT in for next session.  Pt's wife present for session throughout.      Therapy  Documentation Precautions:  Precautions Precautions: Fall Precaution Comments:   Restrictions Weight Bearing Restrictions: No  Pain: Pain Assessment Pain Assessment: Faces Faces Pain Scale: Hurts a little bit Pain Type: Acute pain Pain Location: Abdomen Pain Orientation: Right;Left;Lower Pain Descriptors / Indicators: Discomfort Pain Onset: With Activity Pain Intervention(s): Repositioned ADL: See Function Navigator for Current Functional Status.   Therapy/Group: Individual Therapy  Elana Jian OTR/L 04/19/2017, 3:58 PM

## 2017-04-19 NOTE — Progress Notes (Signed)
Physical Therapy Session Note  Patient Details  Name: [name] TWIST, MD MRN: [id number] Date of Birth: 03/26/1941  Today's Date: 04/19/2017 PT Individual Time: 0901-1000, [id number]  PT Individual Time Calculation (min): 59 min, 56 min  Short Term Goals: Week 1:  PT Short Term Goal 1 (Week 1): Pt to sit X 5 minutes without UE support. PT Short Term Goal 2 (Week 1): Pt to perform sit<>stand transfers with min assist. PT Short Term Goal 3 (Week 1): Pt to ambulate 25 ft with rw.  PT Short Term Goal 4 (Week 1): Pt to perform supine>sitting with min assist.    Skilled Therapeutic Interventions/Progress Updates:    Session 1: Pt in bed upon arrival, agreeable to PT session. States that he needs to use the bathroom prior to walking. Pt refused using the bathroom toilet and requested use of BSC. Supervision with rails for supine>sitting. Min guard for sit/stand for safety and for stand-pivot to [facility]. Pt able to assist with hygiene using toilet paper. Once standing PT using washcloth to finish cleaning. Brief changed with total assist as well as pulling pants up. Ambulation: 120 ft X1, 80 ft X1 using rw and supervision. Pt needing visual cues for directions, unable to follow left/right commands. Exercises; sitting LAQ, resisted knee flexion, isometric unilateral hip add/abd, sit<>stand X5. Cues with sit/stand to avoid pressing LEs into table for assistance. Pt returned to room via w/c with total assist due to time constraints. Pt sitting in w/c with all needs in reach. Pt occasionally needing reorientation to task. Difficulty with some sequencing.  Session 2: Pt sitting on mat table, finishing with OT upon arrival. Pt agreeable to PT session but states that he is very fatigued. Providing pt with medium theraputty for grip strengthening. Gait: ambulating 150 ft X2 using rw, cues for posture and position in rw- supervision. Transfers: mat table>standing supervision with intermittent cues needed for hand  placement with standing and sitting. Upon return to pt room, pt requesting to use bathroom. Pt ambulating into bathroom and performing sit<>stand transfer from [facility]. Cues needed for hand placement and alignment with BSC prior to sitting. Supervision pulling pants down and max assist to pull up. Pt requesting return to bed after session, mod assist with bilateral LEs needed due to fatigue. Pt able to pull self up in trendelenburg with UEs, encouraging use of LEs to assist. Following session, pt in bed with spouse present and all needs in reach.  Therapy Documentation Precautions:  Precautions Precautions: Fall Precaution Comments: scrotal swelling Restrictions Weight Bearing Restrictions: No  Pain:  1/10 in abdomen (tenderness), monitored during session.    See Function Navigator for Current Functional Status.   Therapy/Group: Individual Therapy  Linard Millers, PT 04/19/2017, 12:40 PM

## 2017-04-19 NOTE — Progress Notes (Signed)
[place] PHYSICAL MEDICINE & REHABILITATION     PROGRESS NOTE  Subjective/Complaints:  Pt seen laying in bed this AM.  He slept well overnight.  He complains of diarrhea, however, per nursing, pt having about 3 stools/day.   ROS: +Diarrhea per pt. Denies CP, SOB, N/V/D.  Objective: Vital Signs: Blood pressure (!) 146/79, pulse 63, temperature 97.8 F (36.6 C), temperature source Axillary, resp. rate 18, height 5\' 11"  (1.803 m), weight 101.2 kg (223 lb 1.7 oz), SpO2 99 %. No results found.  Recent Labs  04/18/17 0610  WBC 4.6  HGB 13.1  HCT 39.8  PLT 87*    Recent Labs  04/18/17 0610  NA 137  K 3.8  CL 99*  GLUCOSE 203*  BUN 53*  CREATININE 2.08*  CALCIUM 8.8*   CBG (last 3)   Recent Labs  04/18/17 1710 04/18/17 2123 04/19/17 0634  GLUCAP 121* 130* 177*    Wt Readings from Last 3 Encounters:  04/18/17 101.2 kg (223 lb 1.7 oz)  04/16/17 99.8 kg (220 lb 0.3 oz)  03/02/17 99.8 kg (220 lb)    Physical Exam:  BP (!) 146/79 (BP Location: Right Arm)   Pulse 63   Temp 97.8 F (36.6 C) (Axillary)   Resp 18   Ht 5\' 11"  (1.803 m)   Wt 101.2 kg (223 lb 1.7 oz)   SpO2 99%   BMI 31.12 kg/m  Constitutional: He appears well-developed. NAD. HENT: Normocephalic and atraumatic.  Eyes: EOMI. No discharge.  Cardiovascular: IRRR. No JVD. +Murmur. Respiratory: Effort normal and breath sounds normal.  GI: Soft. Bowel sounds are normal.  PD catheter noted.  Neurological:  Follows simple commands.  STM deficits.  Motor: 4-/5 throughout (stable) Skin.warm and dry. Dry dressing to LUE.  Vascular changes b/l LE Incisions on abdomen with sutures/intact.  Some scattered bruising around abdomen.  Psych: Normal mood and affect.  Assessment/Plan: 1. Functional deficits secondary to debility which require 3+ hours per day of interdisciplinary therapy in a comprehensive inpatient rehab setting. Physiatrist is providing close team supervision and 24 hour management of  active medical problems listed below. Physiatrist and rehab team continue to assess barriers to discharge/monitor patient progress toward functional and medical goals.  Function:  Bathing Bathing position   Position: Wheelchair/chair at sink  Bathing parts Body parts bathed by patient: Right arm, Left arm, Chest, Abdomen, Front perineal area, Right upper leg, Left upper leg Body parts bathed by helper: Right lower leg, Left lower leg, Buttocks, Back  Bathing assist Assist Level: 2 helpers      Upper Body Dressing/Undressing Upper body dressing   What is the patient wearing?: Pull over shirt/dress     Pull over shirt/dress - Perfomed by patient: Thread/unthread right sleeve, Thread/unthread left sleeve, Put head through opening Pull over shirt/dress - Perfomed by helper: Pull shirt over trunk        Upper body assist        Lower Body Dressing/Undressing Lower body dressing   What is the patient wearing?: Pants, Non-skid slipper socks, Socks   Underwear - Performed by helper: Thread/unthread right underwear leg, Thread/unthread left underwear leg, Pull underwear up/down Pants- Performed by patient: Thread/unthread right pants leg, Thread/unthread left pants leg, Pull pants up/down     Non-skid slipper socks- Performed by helper: Don/doff left sock (used sockaide) Socks - Performed by patient: Don/doff right sock                Lower body assist Assist for  lower body dressing: 2 Advertising copywriter assist     Transfers Chair/bed transfer   Chair/bed transfer method: Stand pivot Chair/bed transfer assist level: Touching or steadying assistance (Pt > 75%) Chair/bed transfer assistive device: Armrests, Medical sales representative     Max distance: 125 ft Assist level: Touching or steadying assistance (Pt > 75%)   Wheelchair   Type: Manual Max wheelchair distance: 50 ft Assist Level: Supervision or verbal cues   Cognition Comprehension Comprehension assist level: Understands complex 90% of the time/cues 10% of the time  Expression Expression assist level: Expresses complex 90% of the time/cues < 10% of the time  Social Interaction Social Interaction assist level: Interacts appropriately 75 - 89% of the time - Needs redirection for appropriate language or to initiate interaction.  Problem Solving Problem solving assist level: Solves basic 50 - 74% of the time/requires cueing 25 - 49% of the time  Memory Memory assist level: Recognizes or recalls 75 - 89% of the time/requires cueing 10 - 24% of the time    Medical Problem List and Plan:  1. Weakness with cognitive deficits secondary to hepatic encephalopathy and multiple medical issues   Cont CIR 2. DVT Prophylaxis/Anticoagulation: Chronic Coumadin. Monitor for rebleeding episodes   INR supratherapeutic 4/27 3. Pain Management: Valium 10 mg daily as needed, Ultram 100 mg every 6 hours as needed  4. Mood: Provide emotional support  5. Neuropsych: This patient is not fully capable of making decisions on his own behalf.  6. Skin/Wound Care: Cleanse wounds to left hand Apply Bactroban Vaseline gauze for atraumatic dressing removed. Wrap with Kerlix changed daily  7. Fluids/Electrolytes/Nutrition: Routine I&Os 8. ESRD/CPPD. Status post peritoneal dialysis catheter 04/05/2017. Follow-up renal services  9. 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 Monitor for any signs of fluid  Filed Weights   04/17/17 1015 04/17/17 1403 04/18/17 0734  Weight: 101.6 kg (223 lb 15.8 oz) 102.3 kg (225 lb 8.5 oz) 101.2 kg (223 lb 1.7 oz)   10. 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 Status post paracentesis 03/06/2017. Continue lactulose.   Ammonia 60 on 4/25 11. Atrial fibrillation. Continue Coumadin. Cardiac rate control  12. Hypertension. Lopressor 50 mg twice a day, Hytrin 5 mg daily, Demadex 40 mg daily   Monitor with  increased mobility  Relatively controlled 4/27 13. Anemia of chronic disease.   Hb 13.1 on 4/26  Cont to monitor 14. History of gout. Allopurinol 100 mg daily. Monitor for any gout flareups  15. DM type 2  Monitor with increased mobility  Amaryl 1mg  started 4/26, however pt refusing stating bad reaction in past, d/ced  Labile at present 16. Thrombocytopenia  Plts 87 on 4/26  Cont to monitor 17. Hypoalbuminemia  Supplement initiated 4/26 18. Obesity  Body mass index is 31.12 kg/m.  Diet and exercise education  Encourage weight loss to increase endurance and promote overall health   LOS (Days) 3 A FACE TO FACE EVALUATION WAS PERFORMED  [name] Lorie Phenix 04/19/2017 8:44 AM

## 2017-04-19 NOTE — Progress Notes (Signed)
Speech Language Pathology Daily Session Note  Patient Details  Name: [name] MCMANUS, MD MRN: [id number] Date of Birth: 1941-03-08  Today's Date: 04/19/2017 SLP Individual Time: 1115-1200 SLP Individual Time Calculation (min): 45 min  Short Term Goals: Week 1: SLP Short Term Goal 1 (Week 1): Pt will recall complex, new information with supervision verbal cues for use of external aids.   SLP Short Term Goal 2 (Week 1): Pt will complete complex tasks with supervision verbal cues for functional problem solving.   SLP Short Term Goal 3 (Week 1): Pt will recognize and correct errors during functional tasks with supervision verbal cues.    Skilled Therapeutic Interventions: Pt was seen for skilled ST targeting cognitive goals.  Pt was awaiting therapist at the door of his room and appeared frustrated that SLP hadn't arrived sooner.  Therapist reviewed pt's daily schedule and therapy guidelines for CIR.  Pt reported that he did not like that his therapy schedule was posted on his door where he could not see it so it was left on his room tray at the end of today's session.  Pt was incontinent of bowel and demonstrated appropriate safety awareness when transferring on and off commode via [place] with +2 nurse tech assist.  SLP facilitated the session with a medication management task to address recall of complex new information.  Pt was 100% accurate for recall of his currently scheduled medications and their respective functions with mod I.  Recommend practicing loading a pill box to address problem solving goals at next available therapy appointment.  Pt was left in wheelchair with wife at bedside at the end of today's therapy session.  Continue per current plan of care.       Function:  Eating Eating                 Cognition Comprehension Comprehension assist level: Understands complex 90% of the time/cues 10% of the time  Expression   Expression assist level: Expresses complex 90% of the  time/cues < 10% of the time  Social Interaction Social Interaction assist level: Interacts appropriately 75 - 89% of the time - Needs redirection for appropriate language or to initiate interaction.  Problem Solving Problem solving assist level: Solves basic 50 - 74% of the time/requires cueing 25 - 49% of the time  Memory Memory assist level: Recognizes or recalls 75 - 89% of the time/requires cueing 10 - 24% of the time    Pain Pain Assessment Pain Assessment: No/denies pain  Therapy/Group: Individual Therapy  Houston Zapien, Selinda Orion 04/19/2017, 3:41 PM

## 2017-04-19 NOTE — Progress Notes (Signed)
Social Work Patient ID: [name] Fiddler, MD, male   DOB: 08/21/1941, 76 y.o.   MRN: [id number]   Lynnda Child, LCSW Social Worker Signed   Patient Care Conference Date of Service: 04/19/2017  4:41 PM      Hide copied text Hover for attribution information Inpatient RehabilitationTeam Conference and Plan of Care Update Date: 04/17/2017   Time: 2:10 PM      Patient Name: [name] GILKISON, MD      Medical Record Number: [id number]  Date of Birth: 1940-12-26 Sex: Male         Room/Bed: 4W06C/4W06C-01 Payor Info: Payor: MEDICARE / Plan: MEDICARE PART A AND B / Product Type: *No Product type* /     Admitting Diagnosis: ABD Pain  Admit Date/Time:  04/16/2017  7:34 PM Admission Comments: No comment available    Primary Diagnosis:  <principal problem not specified> Principal Problem: <principal problem not specified>       Patient Active Problem List    Diagnosis Date Noted  . Diabetes mellitus type 2 in obese ([place])    . Hypoalbuminemia due to protein-calorie malnutrition ([place])    . Supratherapeutic INR    . Pain    . Atrial fibrillation ([place])    . Benign essential HTN    . Anemia of chronic disease    . Diabetes mellitus type 2 in nonobese ([facility])    . Thrombocytopenia ([place])    . Hepatic encephalopathy ([place]) 04/16/2017  . Confusion 04/08/2017  . Acute hepatic encephalopathy 04/08/2017  . ESRD on dialysis ([place]) 04/08/2017  . Chronic systolic CHF (congestive heart failure) ([place]) 09/26/2016  . Right heart failure ([place]) 06/26/2016  . Leg wound, left 06/26/2016  . Chronic diastolic CHF (congestive heart failure) ([place]) 03/22/2016  . Ascites 12/02/2015  . Diarrhea 12/02/2015  . Leg cramps 12/02/2015  . Acute on chronic diastolic CHF (congestive heart failure), NYHA class 1 ([place])    . Diastolic dysfunction 37/90/2409  . Dyspnea    . CHF (congestive heart failure) ([place]) 09/06/2015  . CAD (coronary artery disease) 05/11/2015  . S/P placement of cardiac pacemaker 05/10/2015  .  Acute on chronic diastolic CHF (congestive heart failure) ([place]) 05/10/2015  . Cardiac device in situ    . Sick sinus syndrome ([place]) 05/05/2015  . Acute on chronic renal failure ([place]) 03/23/2015  . Bradycardia, sinus, persistent, severe 03/23/2015  . Acute hyponatremia 03/23/2015  . Hyperkalemia 03/23/2015  . Rib pain on right side 03/23/2015  . Poor appetite 03/23/2015  . Acute gastrointestinal bleeding 03/23/2015  . Nausea & vomiting 03/23/2015  . H/O aortic valve replacement with tissue graft 12/09/2014  . Acute posthemorrhagic anemia 06/01/2014  . UTI (urinary tract infection) 05/31/2014  . Anxiety 05/31/2014  . Insomnia 05/31/2014  . Weakness 05/26/2014  . Fever 05/26/2014  . OA (osteoarthritis) of knee 05/24/2014  . Cataract 09/02/2013  . Osteoarthritis 09/02/2013  . Atrial flutter ([place]) 08/20/2012  . Back pain 08/20/2012  . Severe tricuspid regurgitation by prior echocardiogram 08/14/2011  . Aortic valve replaced 08/14/2011  . Chronic atrial fibrillation ([place]) 07/05/2011  . Chronic kidney disease (CKD), stage III (moderate)    . Hx of CABG 04/26/2011  . Diabetes ([place]) 04/26/2011  . Gout 04/26/2011  . BPH (benign prostatic hyperplasia) 04/26/2011  . Essential hypertension 04/26/2011  . MYOCARDIAL INFARCTION 11/27/2010  . Ischemic heart disease 11/27/2010  . Valvular heart disease 11/27/2010  . Sarcoidosis 11/24/2010  . Obstructive sleep apnea 11/24/2010  . Aortic valve  disorder 11/24/2010  . DYSPNEA 11/24/2010      Expected Discharge Date: Expected Discharge Date: 05/01/17   Team Members Present: Physician leading conference: Dr. Delice Lesch Social Worker Present: Alfonse Alpers, LCSW Nurse Present: Heather Roberts, RN PT Present: Carney Living, PT OT Present: Clyda Greener, OT SLP Present: Windell Moulding, SLP PPS Coordinator present : Daiva Nakayama, RN, CRRN       Current Status/Progress Goal Weekly Team Focus  Medical     Weakness with cognitive deficits secondary  to hepatic encephalopathy and multiple medical issues   Improve mobility, safety, INR  See above   Bowel/Bladder     Incontinent of bowel and bladder, LBM 02-17-17,olguric  Continent and bowel and bladder  Assist with toileting need, maintain regular bowel pattern   Swallow/Nutrition/ Hydration               ADL's     mod to max assist for bathing, dressing, stand pivot transfers.  Decreased overall endurance for selfcare tasks.   supervision overall  selfcare retraining, balance training, transfer training, energy conservation strategies, pt/family education, DME education   Mobility     mod A for transfers and med mobility; self propelled w/c 50"; ambulated 3'  supervision overall  balance training, transfer training, energy conservation strategies, pt/family education   Communication               Safety/Cognition/ Behavioral Observations             Pain      Pain <4 with min assist  Pain < or = 3  Assess pain q shift and prn   Skin     8 abdominal incision with sutures ota, left hand dog scratches bactracin in use, left leg weeping, scrotal edema and weeping  skin healing and free from new breakdown/infection  Assess skin q shift, keep scrotum elevated, treatment as ordered     Rehab Goals Patient on target to meet rehab goals: Yes Rehab Goals Revised: none - first conference *See Care Plan and progress notes for long and short-term goals.   Barriers to Discharge: Safety, mobility, supratherapeutic INR, PD, hyperglycemia, thrombocytopenia     Possible Resolutions to Barriers:  Therapies, follow labs, adjust coumadin, adjust DM meds     Discharge Planning/Teaching Needs:  Pt to return to his home with his wife to provide supervision and assistance with PD.  Wife to receive family education closer to d/c.   Team Discussion:  MD is adjusting pt's coumadin to get therapeutic INR.  Pt is currently on sliding scale insulin and my need a maintenance medication for DM.  PD has had  some issues, but nursing is working through these.  Pt should be off PD by 8AM, but no therapies to start until 9AM.  Wife and dtr are receptive to learning about PD.  Pt at min to mod A now with goals of supervision.  Pt may need further diagnostic testing if cognition doesn't clear per ST, but may just be exhausted/medication related.  Cognition declines around 4/5 PM daily.  Supervision level goals.  Revisions to Treatment Plan:  No therapy to start until 9AM.    Continued Need for Acute Rehabilitation Level of Care: The patient requires daily medical management by a physician with specialized training in physical medicine and rehabilitation for the following conditions: Daily direction of a multidisciplinary physical rehabilitation program to ensure safe treatment while eliciting the highest outcome that is of practical value to the patient.: Yes Daily  medical management of patient stability for increased activity during participation in an intensive rehabilitation regime.: Yes Daily analysis of laboratory values and/or radiology reports with any subsequent need for medication adjustment of medical intervention for : Neurological problems;Diabetes problems;Other   Findley Blankenbaker, Silvestre Mesi 04/19/2017, 4:41 PM

## 2017-04-19 NOTE — Progress Notes (Signed)
ANTICOAGULATION CONSULT NOTE - Follow Up Consult  Pharmacy Consult for coumadin Indication: atrial fibrillation  Allergies  Allergen Reactions  . Actos [Pioglitazone Hydrochloride] Swelling and Other (See Comments)    Severe peripheral edema  . Codeine Other (See Comments)    Makes Pt aggressive  . Depakote [Divalproex Sodium] Diarrhea    severe  . Diltiazem Other (See Comments)    lethargy and dyspnea  . Hydralazine Other (See Comments)    Severe chills and SOB   . Isosorbide Dinitrate Other (See Comments)    Severe chills and SOB   . Januvia [Sitagliptin] Diarrhea and Other (See Comments)    bradycardia  . Loop Diuretics Other (See Comments)    Spike in BUN and creatine levels  . Losartan Other (See Comments)    aphasia  . Nsaids Other (See Comments)    Renal problems  . Onglyza [Saxagliptin] Diarrhea and Other (See Comments)    bradycardia  . Orudis [Ketoprofen] Other (See Comments)    Cannot take due to renal insufficiency  . Penicillins Swelling and Other (See Comments)    Serum sickness Has patient had a PCN reaction causing immediate rash, facial/tongue/throat swelling, SOB or lightheadedness with hypotension: Yes Has patient had a PCN reaction causing severe rash involving mucus membranes or skin necrosis: No Has patient had a PCN reaction that required hospitalization Yes Has patient had a PCN reaction occurring within the last 10 years: No If all of the above answers are "NO", then may proceed with Cephalosporin use.   [name] Potassium Iodide Itching and Rash    Severe entire body itching and rash  . Rozerem [Ramelteon] Diarrhea    Severe   . Amiodarone Other (See Comments)    diarrhea  . Latex Swelling  . Verapamil Other (See Comments)    Severe fatigue  . Benazepril Hcl Other (See Comments)    ? Possible lowers platelets  . Indomethacin Other (See Comments)    Renal problems   . Minocycline Other (See Comments)    Makes dizzy and felt just lousy.  [name]  Pentazocine Lactate Other (See Comments)    Unknown allergic reaction - pt and wife do not recall this  . Poison Ivy Extract [Poison Ivy Extract] Rash and Other (See Comments)    blisters  . Sertraline Hcl Other (See Comments)    Pt and wife do not recall this  . Shellfish Allergy Rash    Patient Measurements: Height: 5\' 11"  (180.3 cm) Weight: 223 lb 1.7 oz (101.2 kg) IBW/kg (Calculated) : 75.3 Heparin Dosing Weight:   Vital Signs: Temp: 97.8 F (36.6 C) (04/27 0729) Temp Source: Axillary (04/27 0729) BP: 146/79 (04/27 0729) Pulse Rate: 63 (04/27 0729)  Labs:  Recent Labs  04/17/17 0512 04/18/17 0610  HGB  --  13.1  HCT  --  39.8  PLT  --  87*  LABPROT 33.2* 33.7*  INR 3.17 3.23  CREATININE  --  2.08*    Estimated Creatinine Clearance: 36.1 mL/min (A) (by C-G formula based on SCr of 2.08 mg/dL (H)).   Medications:  Scheduled:  . allopurinol  100 mg Oral Q breakfast  . feeding supplement (NEPRO CARB STEADY)  237 mL Oral BID BM  . feeding supplement (PRO-STAT SUGAR FREE 64)  30 mL Oral BID  . glimepiride  1 mg Oral Q breakfast  . insulin aspart  0-15 Units Subcutaneous TID WC  . lactulose  20 g Oral TID  . loratadine  10 mg Oral Daily  .  metoprolol  50 mg Oral BID  . mupirocin cream   Topical BID  . potassium chloride  40 mEq Oral BID  . terazosin  5 mg Oral BH-q7a  . torsemide  40 mg Oral Daily  . Warfarin - Pharmacist Dosing Inpatient   Does not apply q1800   Infusions:  . dialysis solution 2.5% low-MG/low-CA    . dialysis solution 4.25% low-MG/low-CA      Assessment: 76 yo male with afib is currently on supratherapeutic coumadin.  INR today is 3.69.  Patient was recently on xarelto and did falsely elevate INR but its effect on INR should have been gone by now.  Goal of Therapy:  INR 2-3 Monitor platelets by anticoagulation protocol: Yes   Plan:  - No coumadin tonight - INR in am  Jalea Bronaugh, Tsz-Yin 04/19/2017,8:24 AM

## 2017-04-20 ENCOUNTER — Inpatient Hospital Stay (HOSPITAL_COMMUNITY): Payer: Medicare Other

## 2017-04-20 ENCOUNTER — Inpatient Hospital Stay (HOSPITAL_COMMUNITY): Payer: Medicare Other | Admitting: Occupational Therapy

## 2017-04-20 ENCOUNTER — Inpatient Hospital Stay (HOSPITAL_COMMUNITY): Payer: Medicare Other | Admitting: Speech Pathology

## 2017-04-20 ENCOUNTER — Inpatient Hospital Stay (HOSPITAL_COMMUNITY): Payer: Medicare Other | Admitting: *Deleted

## 2017-04-20 DIAGNOSIS — R5381 Other malaise: Secondary | ICD-10-CM

## 2017-04-20 LAB — GLUCOSE, CAPILLARY
GLUCOSE-CAPILLARY: 170 mg/dL — AB (ref 65–99)
GLUCOSE-CAPILLARY: 98 mg/dL (ref 65–99)
Glucose-Capillary: 45 mg/dL — ABNORMAL LOW (ref 65–99)
Glucose-Capillary: 144 mg/dL — ABNORMAL HIGH (ref 65–99)
Glucose-Capillary: 208 mg/dL — ABNORMAL HIGH (ref 65–99)

## 2017-04-20 LAB — PROTIME-INR
INR: 2.9
PROTHROMBIN TIME: 30.9 s — AB (ref 11.4–15.2)

## 2017-04-20 MED ORDER — ZOLPIDEM TARTRATE 5 MG PO TABS
10.0000 mg | ORAL_TABLET | Freq: Every day | ORAL | Status: DC
  Administered 2017-04-20 – 2017-04-27 (×7): 10 mg via ORAL
  Filled 2017-04-20 (×7): qty 2

## 2017-04-20 MED ORDER — LACTULOSE 10 GM/15ML PO SOLN
20.0000 g | Freq: Two times a day (BID) | ORAL | Status: DC
  Administered 2017-04-26: 20 g via ORAL
  Filled 2017-04-20 (×9): qty 30

## 2017-04-20 MED ORDER — TRAMADOL HCL 50 MG PO TABS
50.0000 mg | ORAL_TABLET | Freq: Once | ORAL | Status: AC
  Administered 2017-04-20: 50 mg via ORAL

## 2017-04-20 MED ORDER — WARFARIN SODIUM 1 MG PO TABS
1.0000 mg | ORAL_TABLET | Freq: Once | ORAL | Status: AC
Start: 2017-04-20 — End: 2017-04-20
  Administered 2017-04-20: 1 mg via ORAL
  Filled 2017-04-20: qty 1

## 2017-04-20 MED ORDER — ACETAMINOPHEN 325 MG PO TABS
650.0000 mg | ORAL_TABLET | Freq: Once | ORAL | Status: AC
  Administered 2017-04-20: 650 mg via ORAL
  Filled 2017-04-20: qty 2

## 2017-04-20 MED ORDER — TRAMADOL HCL 50 MG PO TABS
100.0000 mg | ORAL_TABLET | Freq: Once | ORAL | Status: DC
  Filled 2017-04-20: qty 2

## 2017-04-20 MED ORDER — INSULIN ASPART 100 UNIT/ML ~~LOC~~ SOLN: 0.0000 [IU] | Freq: Three times a day (TID) | SUBCUTANEOUS | Status: DC

## 2017-04-20 MED ORDER — ZOLPIDEM TARTRATE 5 MG PO TABS: 10.0000 mg | ORAL_TABLET | Freq: Every day | ORAL | Status: DC

## 2017-04-20 NOTE — Progress Notes (Signed)
Speech Language Pathology Daily Session Note  Patient Details  Name: [name] AHLGREN, MD MRN: [id number] Date of Birth: July 08, 1941  Today's Date: 04/20/2017 SLP Individual Time: 0804-0900 SLP Individual Time Calculation (min): 56 min and Today's Date: 04/20/2017 SLP Missed Time: 4 Minutes  Short Term Goals: Week 1: SLP Short Term Goal 1 (Week 1): Pt will recall complex, new information with supervision verbal cues for use of external aids.   SLP Short Term Goal 2 (Week 1): Pt will complete complex tasks with supervision verbal cues for functional problem solving.   SLP Short Term Goal 3 (Week 1): Pt will recognize and correct errors during functional tasks with supervision verbal cues.    Skilled Therapeutic Interventions: Skilled treatment session focused on addressing cognition goals. SLP facilitated session by providing increased wait time to recall current meds, functions, and frequency patient with 4/8 accuracy.  Min question cues effectively increased accuracy to 7/8.  Patient with increased difficulty self-monitoring and correcting errors with presence of distractions, such as lunch order, etc.  Patient also demonstrated decreased mental flexibility with Mod assist verbal cues to load box using current list as patient kept transitioning to list from PTA.  At session's end patient reported needing to go to the bathroom and required Min assist verbal cues to problem solve and sequence transfer.  Patient left with nurse tech on the commode.  Continue with current plan of care.    Function:  Cognition Comprehension Comprehension assist level: Understands complex 90% of the time/cues 10% of the time  Expression   Expression assist level: Expresses complex 90% of the time/cues < 10% of the time  Social Interaction Social Interaction assist level: Interacts appropriately 75 - 89% of the time - Needs redirection for appropriate language or to initiate interaction.  Problem Solving Problem  solving assist level: Solves basic 75 - 89% of the time/requires cueing 10 - 24% of the time  Memory Memory assist level: Recognizes or recalls 75 - 89% of the time/requires cueing 10 - 24% of the time    Pain Pain Assessment Pain Assessment: No/denies pain  Therapy/Group: Individual Therapy  Carmelia Roller., [place] [phone]  [place] 04/20/2017, 12:18 PM

## 2017-04-20 NOTE — Progress Notes (Signed)
Physical Therapy Session Note  Patient Details  Name: [name] HEMMELGARN, MD MRN: [id number] Date of Birth: 1941/07/16  Today's Date: 04/20/2017 PT Individual Time: 1305-1405 PT Individual Time Calculation (min): 60 min   Short Term Goals: Week 1:  PT Short Term Goal 1 (Week 1): Pt to sit X 5 minutes without UE support. PT Short Term Goal 2 (Week 1): Pt to perform sit<>stand transfers with min assist. PT Short Term Goal 3 (Week 1): Pt to ambulate 25 ft with rw.  PT Short Term Goal 4 (Week 1): Pt to perform supine>sitting with min assist.    Skilled Therapeutic Interventions/Progress Updates:    Tx focused on therex for strengthening and activity tolerance, functional mobility, and gait with RW in controlled setting. Pt received on toilet, needing A for LB dressing. Pt performed all stand-step and sit<>stand transfers with S/min A depending on surface height and fatigue. Pt demonstrated good safety awareness of brakes, etc.  Gait training 1x 200' and 2x50' with RW and cues for posture, and decreasing UE reliance as able, but pt unable to make significant adjustments.  Nustep x7 min with all extremities with goal of 13 Borg RPE, but pt unable to achieve >8 due to scrotal discomfort, which ended this task.  Stair training x12 with increased time required and close S for descent on 6" stairs. [place] HEP for LAQ and hip ABD bil x10, but pt too fatigued to complete program today. Has handout in room. Pt left EOB handoff to NT for toilet.    Therapy Documentation Precautions:  Precautions Precautions: Fall Precaution Comments:   Restrictions Weight Bearing Restrictions: No General:   Vital Signs: Therapy Vitals Pulse Rate: 60 Oxygen Therapy SpO2: 95 % O2 Device: Not Delivered Pain: Pain Assessment Pain Assessment: No/denies pain   See Function Navigator for Current Functional Status.   Therapy/Group: Individual Therapy  Jakhari Space Soundra Pilon, PT, DPT  04/20/2017, 1:29  PM

## 2017-04-20 NOTE — Progress Notes (Signed)
Inpatient [facility] Individual Statement of Services  Patient Name:  [name] Fiddler, MD  Date:  04/20/2017  Welcome to the [facility].  Our goal is to provide you with an individualized program based on your diagnosis and situation, designed to meet your specific needs.  With this comprehensive rehabilitation program, you will be expected to participate in at least 3 hours of rehabilitation therapies Monday-Friday, with modified therapy programming on the weekends.  Your rehabilitation program will include the following services:  Physical Therapy (PT), Occupational Therapy (OT), Speech Therapy (ST), Neuropsychology, Case Management (Social Worker), Rehabilitation Medicine, Nutrition Services and Pharmacy Services  Weekly team conferences will be held on Wednesdays to discuss your progress.  Your Social Worker will talk with you frequently to get your input and to update you on team discussions.  Team conferences with you and your family in attendance may also be held.  Expected length of stay: 2 weeks  Overall anticipated outcome:  Supervision  Depending on your progress and recovery, your program may change. Your Social Worker will coordinate services and will keep you informed of any changes. Your Social Worker's name and contact numbers are listed  below.  The following services may also be recommended but are not provided by the [facility] will be made to provide these services after discharge if needed.  Arrangements include referral to agencies that provide these services.  Your insurance has been verified to be:  Medicare and [employer] Your primary doctor is:  Dr. Max Fickle P. Le  Pertinent information will be shared with your doctor and your insurance company.  Social Worker:  Alfonse Alpers, LCSW  [phone] or [phone]  Information discussed with and copy given to patient by: Trey Sailors, 04/20/2017, 3:43 PM

## 2017-04-20 NOTE — Progress Notes (Signed)
Patient ID: [name], MD, male   DOB: 05/10/1941, 76 y.o.   MRN: [id number]  [facility] Progress Note    Subjective:   Did not sleep last night.  Wants ambien at bed which is what he normally takes and stop valium   Objective:   BP 139/66 (BP Location: Right Arm)   Pulse 61   Temp 98.3 F (36.8 C) (Oral)   Resp 18   Ht 5\' 11"  (1.803 m)   Wt 101.2 kg (223 lb 1.7 oz)   SpO2 100%   BMI 31.12 kg/m   Intake/Output: I/O last 3 completed shifts: In: 5176 [P.O.:1340; Other:7523] Out: 9547 [Urine:875; Other:8672]   Intake/Output this shift:  Total I/O In: 7525 [Other:7525] Out: 1607 [Other:8944] Weight change:   Physical Exam: Gen:WD obese WM in NAD CVS: no rub Resp:cta PXT:GGYIR, +BS, soft, mildly tender, PD cath in place Ext:2+ edema  Labs: BMET  Recent Labs Lab 04/14/17 0629 04/16/17 0245 04/18/17 0610 04/19/17 0838  NA 132* 136 137 135  K 2.9* 3.4* 3.8 4.1  CL 94* 98* 99* 99*  CO2 29 27 29 28   GLUCOSE 132* 219* 203* 166*  BUN 73* 67* 53* 47*  CREATININE 2.53* 2.12* 2.08* 2.03*  ALBUMIN  --  2.4* 2.4* 2.5*  CALCIUM 8.3* 8.4* 8.8* 8.7*  PHOS  --  3.1 3.4 3.1   CBC  Recent Labs Lab 04/16/17 0245 04/18/17 0610  WBC 5.9 4.6  HGB 12.3* 13.1  HCT 37.0* 39.8  MCV 93.7 95.7  PLT 82* 87*    @IMGRELPRIORS @ Medications:    . allopurinol  100 mg Oral Q breakfast  . feeding supplement (NEPRO CARB STEADY)  237 mL Oral BID BM  . feeding supplement (PRO-STAT SUGAR FREE 64)  30 mL Oral BID  . gentamicin cream  1 application Topical Daily  . insulin aspart  0-15 Units Subcutaneous TID WC  . lactulose  20 g Oral TID  . loratadine  10 mg Oral Daily  . metoprolol  50 mg Oral BID  . mupirocin cream   Topical BID  . potassium chloride  40 mEq Oral BID  . terazosin  5 mg Oral BH-q7a  . torsemide  40 mg Oral Daily  . Warfarin - Pharmacist Dosing Inpatient   Does not apply [id number]     Assessment/ Plan:   1. Right sided CHF with  scrotal swelling. CT scan negative for hernia or leak.  Slowly losing weight with UF.  Cont to monitor daily weights. 2. ESRD doing well with CCPD. tolerating recent increase of volume to 1500 ml to improve clearance and increase fill time to help with abdominal pain/cramping. I spoke with Dr. Raul Del regarding removing the sutures from the surgery and will wait until early next week due to ascites and hernia repair.  1. Did well with increaseddraintime and with new cycler as the oldone alarmed all night 2. Will increase fill volume to 2 liters tonight. 3. Should be able to tolerate ambulatory PD now that he is 2 weeks out from PD catheter placement 4. Markedly improved clearances 3. Anemia: stable 4. CKD-MBD: stable 5. Nutrition: renal diet, low K cont with supplementation and change diet without potassium restriction 6. Insomnia- will stop valium and start ambien (home med) 10mg   7. Hypertension: stable 8. 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  Hopefully with improved volume, hepatic congestion will not be  an issue moving forward. 9. Deconditioning- PT/OT and able to ambulate yesterday Donetta Potts, MD [place] Pager [phone] 04/20/2017, 9:45 AM

## 2017-04-20 NOTE — Progress Notes (Addendum)
Occupational Therapy Session Note  Patient Details  Name: [name] LANGSTON, MD MRN: [id number] Date of Birth: 05/08/1941  Today's Date: 04/20/2017 OT Individual Time: 1840-3754 OT Individual Time Calculation (min): 43 min    Short Term Goals: Week 1:  OT Short Term Goal 1 (Week 1): Pt will maintain dynamic sitting EOB with supervison during performance of selfcare tasks.  OT Short Term Goal 2 (Week 1): Pt will complete sit to stand for LB bathing and dressing with min assist.  OT Short Term Goal 3 (Week 1): Pt will donn LB clothing over feet with supervision and use of AE. OT Short Term Goal 4 (Week 1): Pt will perform toilet transfer to the elevated toilet with min assist using the RW.  Skilled Therapeutic Interventions/Progress Updates:    1:1. No c.o pain, however reports very fatigued. Pt spouse present. Focus on LB dressing and functional mobility. Pt supine HOB elevated with supervision. Pt moving extremely slowly this date and becomes winded easily. Pt attempts to don sock on sock aide sitting EOB ~7, however pt losing balance posteriorly with CGA to correct. OT recommend transfer to w/c to focus on AE use as opposed to trunk control. Pt stand pivot transfer with RW with MOD A for lifting and balance while With significantly increased time and VC for technique, pt dons socks with sock aide and slip on shoes with shoe funnel. Pt requires rest breaks in between each item. Exited session with pt seated in w/c with call light in reach and all needs met.   Therapy Documentation Precautions:  Precautions Precautions: Fall Precaution Comments:   Restrictions Weight Bearing Restrictions: No  See Function Navigator for Current Functional Status.   Therapy/Group: Individual Therapy  Tonny Branch 04/20/2017, 5:47 PM

## 2017-04-20 NOTE — Progress Notes (Signed)
Occupational Therapy Session Note  Patient Details  Name: [name] SACHS, MD MRN: [id number] Date of Birth: 07-10-1941  Today's Date: 04/20/2017 OT Individual Time: 8280-0349 OT Individual Time Calculation (min): 80 min    Short Term Goals: Week 1:  OT Short Term Goal 1 (Week 1): Pt will maintain dynamic sitting EOB with supervison during performance of selfcare tasks.  OT Short Term Goal 2 (Week 1): Pt will complete sit to stand for LB bathing and dressing with min assist.  OT Short Term Goal 3 (Week 1): Pt will donn LB clothing over feet with supervision and use of AE. OT Short Term Goal 4 (Week 1): Pt will perform toilet transfer to the elevated toilet with min assist using the RW.  Skilled Therapeutic Interventions/Progress Updates:    Pt completed bathing and dressing sit to stand at the sink this session.  Pt reporting not being able to sleep last night secondary to pain meds and not getting his Ambien he usually takes at home.  This information communicated with MD who came in to see him as well.  Pt washed UB with max assist from therapist for washing his back.  He reports having a scrub brush at home, but was not interested in a LH sponge this visit.  Min assist for all sit to stand transitions for washing peri area and for toilet transfer with the RW.  Max assist for threading pants over feet with gripper socks, secondary to increased friction, as well as mod assist to pull pants over his hips.  Pt did not want to wash his feet so gripper socks were not removed.  Still with limitations for reaching his lower legs or for crossing them over the opposite knee.  Needs continued practice with sockaide and integration of reacher as well.  Pt left in bed at end of session with min assist for transfer stand pivot with the RW to the bed.  Call button and phone in reach.    Therapy Documentation Precautions:  Precautions Precautions: Fall Precaution Comments:   Restrictions Weight  Bearing Restrictions: No  Pain: Pain Assessment Pain Assessment: No/denies pain ADL: See Function Navigator for Current Functional Status.   Therapy/Group: Individual Therapy  Gavrielle Streck OTR/L 04/20/2017, 12:43 PM

## 2017-04-20 NOTE — Progress Notes (Signed)
Hypoglycemic Event  CBG: 45  Treatment: dinner + ginger ale  Symptoms: per wife shaking  Follow-up CBG: Time:1744 CBG Result:144  Possible Reasons for Event: maybe med (insulin)  Comments/MD notified:change SSI to sensitive / Dr. Jamelle Haring, Marnette Burgess

## 2017-04-20 NOTE — Progress Notes (Signed)
[facility] PHYSICAL MEDICINE & REHABILITATION     PROGRESS NOTE  Subjective/Complaints:  Patient still has soreness when he has his peritoneal dialysis treatments. He has chronic insomnia and takes Ambien at home and this has been reordered by nephrology. Discussed case with nephrology  ROS: +Diarrhea per pt. Denies CP, SOB, N/V/D.  Objective: Vital Signs: Blood pressure 139/66, pulse 61, temperature 98.3 F (36.8 C), temperature source Oral, resp. rate 18, height 5\' 11"  (1.803 m), weight 101.2 kg (223 lb 1.7 oz), SpO2 100 %. No results found.  Recent Labs  04/18/17 0610  WBC 4.6  HGB 13.1  HCT 39.8  PLT 87*    Recent Labs  04/18/17 0610 04/19/17 0838  NA 137 135  K 3.8 4.1  CL 99* 99*  GLUCOSE 203* 166*  BUN 53* 47*  CREATININE 2.08* 2.03*  CALCIUM 8.8* 8.7*   CBG (last 3)   Recent Labs  04/19/17 1214 04/19/17 1659 04/19/17 2024  GLUCAP 122* 115* 115*    Wt Readings from Last 3 Encounters:  04/18/17 101.2 kg (223 lb 1.7 oz)  04/16/17 99.8 kg (220 lb 0.3 oz)  03/02/17 99.8 kg (220 lb)    Physical Exam:  BP 139/66 (BP Location: Right Arm)   Pulse 61   Temp 98.3 F (36.8 C) (Oral)   Resp 18   Ht 5\' 11"  (1.803 m)   Wt 101.2 kg (223 lb 1.7 oz)   SpO2 100%   BMI 31.12 kg/m  Constitutional: He appears well-developed. NAD. HENT: Normocephalic and atraumatic.  Eyes: EOMI. No discharge.  Cardiovascular: IRRR. No JVD. +Murmur. Respiratory: Effort normal and breath sounds normal.  GI: Soft. Bowel sounds are normal.  PD catheter noted.  Neurological:  Follows simple commands.  STM deficits.  Motor: 4-/5 throughout (stable) Skin.warm and dry. Dry dressing to LUE.  Vascular changes b/l LE Incisions on abdomen with sutures/intact.  Some scattered bruising around abdomen.  Psych: Normal mood and affect.  Assessment/Plan: 1. Functional deficits secondary to debility which require 3+ hours per day of interdisciplinary therapy in a comprehensive inpatient  rehab setting. Physiatrist is providing close team supervision and 24 hour management of active medical problems listed below. Physiatrist and rehab team continue to assess barriers to discharge/monitor patient progress toward functional and medical goals.  Function:  Bathing Bathing position   Position: Wheelchair/chair at sink  Bathing parts Body parts bathed by patient: Right arm, Left arm, Chest, Abdomen, Front perineal area, Right upper leg, Left upper leg Body parts bathed by helper: Right lower leg, Left lower leg, Buttocks, Back  Bathing assist Assist Level: 2 helpers      Upper Body Dressing/Undressing Upper body dressing   What is the patient wearing?: Pull over shirt/dress     Pull over shirt/dress - Perfomed by patient: Thread/unthread right sleeve, Thread/unthread left sleeve, Put head through opening Pull over shirt/dress - Perfomed by helper: Pull shirt over trunk        Upper body assist        Lower Body Dressing/Undressing Lower body dressing   What is the patient wearing?: Pants   Underwear - Performed by helper: Thread/unthread right underwear leg, Thread/unthread left underwear leg, Pull underwear up/down Pants- Performed by patient: Thread/unthread right pants leg, Thread/unthread left pants leg, Pull pants up/down Pants- Performed by helper: Thread/unthread right pants leg, Thread/unthread left pants leg, Pull pants up/down   Non-skid slipper socks- Performed by helper: Don/doff left sock (used sockaide) Socks - Performed by patient: Don/doff right  sock                Lower body assist Assist for lower body dressing: 2 Helpers      Toileting Toileting          Toileting assist     Transfers Chair/bed transfer   Chair/bed transfer method: Stand pivot Chair/bed transfer assist level: Touching or steadying assistance (Pt > 75%) Chair/bed transfer assistive device: Mechanical lift Mechanical lift: Stedy   Locomotion Ambulation     Max  distance: 150 ft Assist level: Supervision or verbal cues   Wheelchair   Type: Manual Max wheelchair distance: 50 ft Assist Level: Supervision or verbal cues  Cognition Comprehension Comprehension assist level: Understands complex 90% of the time/cues 10% of the time  Expression Expression assist level: Expresses complex 90% of the time/cues < 10% of the time  Social Interaction Social Interaction assist level: Interacts appropriately 75 - 89% of the time - Needs redirection for appropriate language or to initiate interaction.  Problem Solving Problem solving assist level: Solves basic 75 - 89% of the time/requires cueing 10 - 24% of the time  Memory Memory assist level: Recognizes or recalls 75 - 89% of the time/requires cueing 10 - 24% of the time    Medical Problem List and Plan:  1. Weakness with cognitive deficits secondary to hepatic encephalopathy and multiple medical issues   Cont CIR PT, OT, speech therapy 2. DVT Prophylaxis/Anticoagulation: Chronic Coumadin. Monitor for rebleeding episodes   INR therapeutic 4/28 3. Pain Management: Valium 10 mg daily as needed, Ultram 100 mg every 6 hours as needed  4. Mood: Provide emotional support  5. Neuropsych: This patient is not fully capable of making decisions on his own behalf.  6. Skin/Wound Care: Cleanse wounds to left hand Apply Bactroban Vaseline gauze for atraumatic dressing removed. Wrap with Kerlix changed daily  7. Fluids/Electrolytes/Nutrition: Routine I&Os 8. ESRD/CPPD. Status post peritoneal dialysis catheter 04/05/2017. Follow-up renal services  9. 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 Monitor for any signs of fluid  Filed Weights   04/17/17 1015 04/17/17 1403 04/18/17 0734  Weight: 101.6 kg (223 lb 15.8 oz) 102.3 kg (225 lb 8.5 oz) 101.2 kg (223 lb 1.7 oz)   10. 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 Status post paracentesis 03/06/2017. Continue lactulose.   Ammonia  60 on 4/25, 48 on 4/27, patient had multiple stools yesterday 11. Atrial fibrillation. Continue Coumadin. Cardiac rate control  12. Hypertension. Lopressor 50 mg twice a day, Hytrin 5 mg daily, Demadex 40 mg daily   Monitor with increased mobility  Relatively controlled 4/28 13. Anemia of chronic disease.   Hb 13.1 on 4/26  Cont to monitor 14. History of gout. Allopurinol 100 mg daily. Monitor for any gout flareups  15. DM type 2  Monitor ,controlled 4/27 16. Fecal incontinence and diarrhea, this is related to his Chronulac plus immobility, patient wishes to hold off on Chronulac for now.    CBG (last 3)   Recent Labs  04/19/17 1214 04/19/17 1659 04/19/17 2024  GLUCAP 122* 115* 115*    16. Thrombocytopenia -no clincal signs of bleeding  Plts 87 on 4/26  Cont to monitor 17. Hypoalbuminemia  Supplement initiated 4/26 18. Obesity  Body mass index is 31.12 kg/m.  Diet and exercise education  Encourage weight loss to increase endurance and promote overall health   LOS (Days) 4 A FACE TO FACE EVALUATION WAS PERFORMED  Charlett Blake 04/20/2017 6:21 AM

## 2017-04-20 NOTE — Progress Notes (Signed)
ANTICOAGULATION CONSULT NOTE - Follow Up Consult  Pharmacy Consult for coumadin Indication: atrial fibrillation  Allergies  Allergen Reactions  . Actos [Pioglitazone Hydrochloride] Swelling and Other (See Comments)    Severe peripheral edema  . Codeine Other (See Comments)    Makes Pt aggressive  . Depakote [Divalproex Sodium] Diarrhea    severe  . Diltiazem Other (See Comments)    lethargy and dyspnea  . Hydralazine Other (See Comments)    Severe chills and SOB   . Isosorbide Dinitrate Other (See Comments)    Severe chills and SOB   . Januvia [Sitagliptin] Diarrhea and Other (See Comments)    bradycardia  . Loop Diuretics Other (See Comments)    Spike in BUN and creatine levels  . Losartan Other (See Comments)    aphasia  . Nsaids Other (See Comments)    Renal problems  . Onglyza [Saxagliptin] Diarrhea and Other (See Comments)    bradycardia  . Orudis [Ketoprofen] Other (See Comments)    Cannot take due to renal insufficiency  . Penicillins Swelling and Other (See Comments)    Serum sickness Has patient had a PCN reaction causing immediate rash, facial/tongue/throat swelling, SOB or lightheadedness with hypotension: Yes Has patient had a PCN reaction causing severe rash involving mucus membranes or skin necrosis: No Has patient had a PCN reaction that required hospitalization Yes Has patient had a PCN reaction occurring within the last 10 years: No If all of the above answers are "NO", then may proceed with Cephalosporin use.   [name] Potassium Iodide Itching and Rash    Severe entire body itching and rash  . Rozerem [Ramelteon] Diarrhea    Severe   . Amiodarone Other (See Comments)    diarrhea  . Latex Swelling  . Verapamil Other (See Comments)    Severe fatigue  . Benazepril Hcl Other (See Comments)    ? Possible lowers platelets  . Indomethacin Other (See Comments)    Renal problems   . Minocycline Other (See Comments)    Makes dizzy and felt just lousy.  [name]  Pentazocine Lactate Other (See Comments)    Unknown allergic reaction - pt and wife do not recall this  . Poison Ivy Extract [Poison Ivy Extract] Rash and Other (See Comments)    blisters  . Sertraline Hcl Other (See Comments)    Pt and wife do not recall this  . Shellfish Allergy Rash    Patient Measurements: Height: 5\' 11"  (180.3 cm) Weight: 223 lb 1.7 oz (101.2 kg) IBW/kg (Calculated) : 75.3 Heparin Dosing Weight:   Vital Signs: Temp: 98.3 F (36.8 C) (04/28 0458) Temp Source: Oral (04/28 0458) BP: 139/66 (04/28 0458) Pulse Rate: 61 (04/28 0458)  Labs:  Recent Labs  04/18/17 0610 04/19/17 0838 04/20/17 0508  HGB 13.1  --   --   HCT 39.8  --   --   PLT 87*  --   --   LABPROT 33.7* 37.5* 30.9*  INR 3.23 3.69 2.90  CREATININE 2.08* 2.03*  --     Estimated Creatinine Clearance: 36.9 mL/min (A) (by C-G formula based on SCr of 2.03 mg/dL (H)).   Medications:  Scheduled:  . allopurinol  100 mg Oral Q breakfast  . feeding supplement (NEPRO CARB STEADY)  237 mL Oral BID BM  . feeding supplement (PRO-STAT SUGAR FREE 64)  30 mL Oral BID  . gentamicin cream  1 application Topical Daily  . insulin aspart  0-15 Units Subcutaneous TID WC  .  lactulose  20 g Oral BID  . loratadine  10 mg Oral Daily  . metoprolol  50 mg Oral BID  . mupirocin cream   Topical BID  . potassium chloride  40 mEq Oral BID  . terazosin  5 mg Oral BH-q7a  . torsemide  40 mg Oral Daily  . Warfarin - Pharmacist Dosing Inpatient   Does not apply q1800  . zolpidem  10 mg Oral QHS   Infusions:  . dialysis solution 2.5% low-MG/low-CA    . dialysis solution 4.25% low-MG/low-CA      Assessment: 76 yo male with afib is currently on coumadin but on Xarelto PTA. Transitioned to Coumadin with new ESRD on PD.  INR now back down to therapeutic range at 2.9. Patient was recently on xarelto and did falsely elevate INR but its effect on INR should have been gone by now. No significant bleeding issues  reported.   Goal of Therapy:  INR 2-3 Monitor platelets by anticoagulation protocol: Yes   Plan:  - Coumadin 1 mg x 1 tonight - Daily INR, CBC q 72 h  Archimedes Harold K. Velva Harman, PharmD, BCPS, CPP Clinical Pharmacist Pager: [phone] Phone: [phone] 04/20/2017 11:56 AM

## 2017-04-20 NOTE — Progress Notes (Addendum)
Refused lactulose, patient wanted time off from  bowel movement.

## 2017-04-21 ENCOUNTER — Inpatient Hospital Stay (HOSPITAL_COMMUNITY): Payer: Medicare Other

## 2017-04-21 ENCOUNTER — Inpatient Hospital Stay (HOSPITAL_COMMUNITY): Payer: Medicare Other | Admitting: Physical Therapy

## 2017-04-21 LAB — CBC
HCT: 38.6 % — ABNORMAL LOW (ref 39.0–52.0)
HEMOGLOBIN: 12.8 g/dL — AB (ref 13.0–17.0)
MCH: 31.4 pg (ref 26.0–34.0)
MCHC: 33.2 g/dL (ref 30.0–36.0)
MCV: 94.8 fL (ref 78.0–100.0)
Platelets: 116 10*3/uL — ABNORMAL LOW (ref 150–400)
RBC: 4.07 MIL/uL — AB (ref 4.22–5.81)
RDW: 16.6 % — ABNORMAL HIGH (ref 11.5–15.5)
WBC: 3.6 10*3/uL — ABNORMAL LOW (ref 4.0–10.5)

## 2017-04-21 LAB — RENAL FUNCTION PANEL
ANION GAP: 9 (ref 5–15)
Albumin: 2.4 g/dL — ABNORMAL LOW (ref 3.5–5.0)
BUN: 43 mg/dL — ABNORMAL HIGH (ref 6–20)
CALCIUM: 8.6 mg/dL — AB (ref 8.9–10.3)
CHLORIDE: 104 mmol/L (ref 101–111)
CO2: 25 mmol/L (ref 22–32)
Creatinine, Ser: 2.12 mg/dL — ABNORMAL HIGH (ref 0.61–1.24)
GFR calc Af Amer: 33 mL/min — ABNORMAL LOW (ref >60)
GFR calc non Af Amer: 28 mL/min — ABNORMAL LOW (ref >60)
GLUCOSE: 195 mg/dL — AB (ref 65–99)
PHOSPHORUS: 3.6 mg/dL (ref 2.5–4.6)
POTASSIUM: 4.1 mmol/L (ref 3.5–5.1)
Sodium: 138 mmol/L (ref 135–145)

## 2017-04-21 LAB — PROTIME-INR
INR: 2.49
Prothrombin Time: 27.4 seconds — ABNORMAL HIGH (ref 11.4–15.2)

## 2017-04-21 LAB — CULTURE, BODY FLUID W GRAM STAIN -BOTTLE: Culture: NO GROWTH

## 2017-04-21 LAB — GLUCOSE, CAPILLARY
GLUCOSE-CAPILLARY: 160 mg/dL — AB (ref 65–99)
GLUCOSE-CAPILLARY: 165 mg/dL — AB (ref 65–99)
GLUCOSE-CAPILLARY: 146 mg/dL — AB (ref 65–99)
Glucose-Capillary: 82 mg/dL (ref 65–99)
Glucose-Capillary: 182 mg/dL — ABNORMAL HIGH (ref 65–99)

## 2017-04-21 LAB — CULTURE, BODY FLUID-BOTTLE

## 2017-04-21 MED ORDER — DELFLEX-LC/2.5% DEXTROSE 394 MOSM/L IP SOLN: INTRAPERITONEAL | Status: DC

## 2017-04-21 MED ORDER — TRAMADOL HCL 50 MG PO TABS
100.0000 mg | ORAL_TABLET | Freq: Every evening | ORAL | Status: DC
  Administered 2017-04-21 – 2017-04-26 (×6): 100 mg via ORAL
  Filled 2017-04-21 (×7): qty 2

## 2017-04-21 MED ORDER — INSULIN ASPART 100 UNIT/ML ~~LOC~~ SOLN
0.0000 [IU] | Freq: Three times a day (TID) | SUBCUTANEOUS | Status: DC
Start: 2017-04-21 — End: 2017-04-27

## 2017-04-21 MED ORDER — WARFARIN SODIUM 1 MG PO TABS
1.0000 mg | ORAL_TABLET | Freq: Once | ORAL | Status: AC
  Administered 2017-04-21: 1 mg via ORAL
  Filled 2017-04-21: qty 1

## 2017-04-21 MED ORDER — ACETAMINOPHEN 325 MG PO TABS
650.0000 mg | ORAL_TABLET | Freq: Every day | ORAL | Status: DC
  Administered 2017-04-21: 650 mg via ORAL
  Filled 2017-04-21: qty 2

## 2017-04-21 NOTE — Progress Notes (Signed)
ANTICOAGULATION CONSULT NOTE - Follow Up Consult  Pharmacy Consult for coumadin Indication: atrial fibrillation  Allergies  Allergen Reactions  . Actos [Pioglitazone Hydrochloride] Swelling and Other (See Comments)    Severe peripheral edema  . Codeine Other (See Comments)    Makes Pt aggressive  . Depakote [Divalproex Sodium] Diarrhea    severe  . Diltiazem Other (See Comments)    lethargy and dyspnea  . Hydralazine Other (See Comments)    Severe chills and SOB   . Isosorbide Dinitrate Other (See Comments)    Severe chills and SOB   . Januvia [Sitagliptin] Diarrhea and Other (See Comments)    bradycardia  . Loop Diuretics Other (See Comments)    Spike in BUN and creatine levels  . Losartan Other (See Comments)    aphasia  . Nsaids Other (See Comments)    Renal problems  . Onglyza [Saxagliptin] Diarrhea and Other (See Comments)    bradycardia  . Orudis [Ketoprofen] Other (See Comments)    Cannot take due to renal insufficiency  . Penicillins Swelling and Other (See Comments)    Serum sickness Has patient had a PCN reaction causing immediate rash, facial/tongue/throat swelling, SOB or lightheadedness with hypotension: Yes Has patient had a PCN reaction causing severe rash involving mucus membranes or skin necrosis: No Has patient had a PCN reaction that required hospitalization Yes Has patient had a PCN reaction occurring within the last 10 years: No If all of the above answers are "NO", then may proceed with Cephalosporin use.   [name] Potassium Iodide Itching and Rash    Severe entire body itching and rash  . Rozerem [Ramelteon] Diarrhea    Severe   . Amiodarone Other (See Comments)    diarrhea  . Latex Swelling  . Verapamil Other (See Comments)    Severe fatigue  . Benazepril Hcl Other (See Comments)    ? Possible lowers platelets  . Indomethacin Other (See Comments)    Renal problems   . Minocycline Other (See Comments)    Makes dizzy and felt just lousy.  [name]  Pentazocine Lactate Other (See Comments)    Unknown allergic reaction - pt and wife do not recall this  . Poison Ivy Extract [Poison Ivy Extract] Rash and Other (See Comments)    blisters  . Sertraline Hcl Other (See Comments)    Pt and wife do not recall this  . Shellfish Allergy Rash    Patient Measurements: Height: 5\' 11"  (180.3 cm) Weight: 219 lb 5.7 oz (99.5 kg) IBW/kg (Calculated) : 75.3 Heparin Dosing Weight:   Vital Signs: Temp: 98 F (36.7 C) (04/29 0930) Temp Source: Oral (04/29 0930) BP: 138/79 (04/29 0930) Pulse Rate: 59 (04/29 0930)  Labs:  Recent Labs  04/19/17 0838 04/20/17 0508 04/21/17 0519  HGB  --   --  12.8*  HCT  --   --  38.6*  PLT  --   --  116*  LABPROT 37.5* 30.9* 27.4*  INR 3.69 2.90 2.49  CREATININE 2.03*  --  2.12*    Estimated Creatinine Clearance: 35.1 mL/min (A) (by C-G formula based on SCr of 2.12 mg/dL (H)).   Medications:  Scheduled:  . allopurinol  100 mg Oral Q breakfast  . feeding supplement (NEPRO CARB STEADY)  237 mL Oral BID BM  . feeding supplement (PRO-STAT SUGAR FREE 64)  30 mL Oral BID  . gentamicin cream  1 application Topical Daily  . insulin aspart  0-9 Units Subcutaneous TID WC  .  lactulose  20 g Oral BID  . loratadine  10 mg Oral Daily  . metoprolol  50 mg Oral BID  . mupirocin cream   Topical BID  . potassium chloride  40 mEq Oral BID  . terazosin  5 mg Oral BH-q7a  . torsemide  40 mg Oral Daily  . traMADol  100 mg Oral QPM  . Warfarin - Pharmacist Dosing Inpatient   Does not apply q1800  . zolpidem  10 mg Oral QHS   Infusions:  . dialysis solution 2.5% low-MG/low-CA    . dialysis solution 4.25% low-MG/low-CA      Assessment: 76 yo male with afib is currently on coumadin but on Xarelto PTA. Transitioned to Coumadin with new ESRD on PD.  INR now back down to therapeutic range at 2.49 after restart warfarin yesterday. Will continue to dose cautiously as he seems very sensitive to minimal changes in  Coumadin dose. CBC relatively stable. No significant bleeding issues reported.   Goal of Therapy:  INR 2-3 Monitor platelets by anticoagulation protocol: Yes   Plan:  - Coumadin 1 mg x 1 tonight - Daily INR, CBC q 72 h  Javarian Jakubiak K. Velva Harman, PharmD, BCPS, CPP Clinical Pharmacist Pager: [phone] Phone: [phone] 04/21/2017 12:57 PM

## 2017-04-21 NOTE — Progress Notes (Signed)
Social Work Assessment and Plan  Patient Details  Name: [name] UMAR, MD MRN: [id number] Date of Birth: 1941/08/27  Today's Date: 04/18/2017  Problem List:  Patient Active Problem List   Diagnosis Date Noted  . Physical deconditioning 04/20/2017  . Diabetes mellitus type 2 in obese ([place])   . Hypoalbuminemia due to protein-calorie malnutrition ([place])   . Supratherapeutic INR   . Pain   . Atrial fibrillation ([place])   . Benign essential HTN   . Anemia of chronic disease   . Diabetes mellitus type 2 in nonobese (HCC)   . Thrombocytopenia ([place])   . Hepatic encephalopathy ([place]) 04/16/2017  . Confusion 04/08/2017  . Acute hepatic encephalopathy 04/08/2017  . ESRD on dialysis ([place]) 04/08/2017  . Chronic systolic CHF (congestive heart failure) ([place]) 09/26/2016  . Right heart failure ([place]) 06/26/2016  . Leg wound, left 06/26/2016  . Chronic diastolic CHF (congestive heart failure) ([place]) 03/22/2016  . Ascites 12/02/2015  . Diarrhea 12/02/2015  . Leg cramps 12/02/2015  . Acute on chronic diastolic CHF (congestive heart failure), NYHA class 1 ([place])   . Diastolic dysfunction 75/44/9201  . Dyspnea   . CHF (congestive heart failure) ([place]) 09/06/2015  . CAD (coronary artery disease) 05/11/2015  . S/P placement of cardiac pacemaker 05/10/2015  . Acute on chronic diastolic CHF (congestive heart failure) ([place]) 05/10/2015  . Cardiac device in situ   . Sick sinus syndrome ([place]) 05/05/2015  . Acute on chronic renal failure ([place]) 03/23/2015  . Bradycardia, sinus, persistent, severe 03/23/2015  . Acute hyponatremia 03/23/2015  . Hyperkalemia 03/23/2015  . Rib pain on right side 03/23/2015  . Poor appetite 03/23/2015  . Acute gastrointestinal bleeding 03/23/2015  . Nausea & vomiting 03/23/2015  . H/O aortic valve replacement with tissue graft 12/09/2014  . Acute posthemorrhagic anemia 06/01/2014  . UTI (urinary tract infection) 05/31/2014  . Anxiety 05/31/2014  . Insomnia 05/31/2014   . Weakness 05/26/2014  . Fever 05/26/2014  . OA (osteoarthritis) of knee 05/24/2014  . Cataract 09/02/2013  . Osteoarthritis 09/02/2013  . Atrial flutter ([place]) 08/20/2012  . Back pain 08/20/2012  . Severe tricuspid regurgitation by prior echocardiogram 08/14/2011  . Aortic valve replaced 08/14/2011  . Chronic atrial fibrillation ([place]) 07/05/2011  . Chronic kidney disease (CKD), stage III (moderate)   . Hx of CABG 04/26/2011  . Diabetes ([place]) 04/26/2011  . Gout 04/26/2011  . BPH (benign prostatic hyperplasia) 04/26/2011  . Essential hypertension 04/26/2011  . MYOCARDIAL INFARCTION 11/27/2010  . Ischemic heart disease 11/27/2010  . Valvular heart disease 11/27/2010  . Sarcoidosis 11/24/2010  . Obstructive sleep apnea 11/24/2010  . Aortic valve disorder 11/24/2010  . DYSPNEA 11/24/2010   Past Medical History:  Past Medical History:  Diagnosis Date  . A-fib ([place])    permanent with tachy-brady syndrome  . Anemia   . Anxiety   . Atrial flutter (HCC)    hx of  . Biceps tendon tear    right  . Bone marrow disease   . CHF (congestive heart failure) ([place])   . Chronic kidney disease (CKD), stage III (moderate)    lov note dr Koleen Nimrod nephrology 09-17-13 on chart  . Complication of anesthesia 11-16-13   required alot of versed per anesthesia with cataract surgery  . Coronary heart disease    stent, CABG, RBBB  . DDD (degenerative disc disease)   . Diabetes ([place])   . Diabetes mellitus   . Fungal toenail infection   . Gout   .  Hemorrhagic cystitis 2012  . Hepatitis    mono  . HTN (hypertension)   . Hypercalcemia 2011   due to sarcoidiosis  . Myocardial infarction [facility])    1995, 2002, 2006  . OSA (obstructive sleep apnea)    use bipap setting of 10 and 12  . Osteoarthritis   . Pneumonia 1966, 2011   hx of  . Presence of permanent cardiac pacemaker   . Right sciatic nerve pain    for block thursday 01-21-2014, injections  . Sarcoid 2011   pulmonary and bone marrow   . Shortness of breath dyspnea   . Synovial cyst of lumbar spine    l3-l4, l4-l5, injections  . Valvular heart disease    aortic stenosis/regurgitation   Past Surgical History:  Past Surgical History:  Procedure Laterality Date  . BASAL CELL CARCINOMA EXCISION Right 2015   ear  . CARDIAC CATHETERIZATION N/A 12/10/2016   Procedure: Right Heart Cath;  Surgeon: Jolaine Artist, MD;  Location: [place] [facility];  Service: Cardiovascular;  Laterality: N/A;  . CARDIAC VALVE REPLACEMENT    . CARDIOVERSION N/A 12/30/2013   Procedure: CARDIOVERSION;  Surgeon: [name], MD;  Location: [facility] ENDOSCOPY;  Service: Cardiovascular;  Laterality: N/A;  10:49 cardioversion at 120 joules, then 150 joules, to SB  used  Lido 45m,  Propofol 160 mcg  . CARDIOVERSION  2003, 2006, 2012, 2013  . cataract surgery Right 2007  . cataract surgery Left 11-16-13  . COLONOSCOPY N/A 01/29/2014   Procedure: COLONOSCOPY;  Surgeon: JWinfield Cunas, MD;  [facility];  Service: Endoscopy;  Laterality: N/A;  amanda//ja  . CORONARY ARTERY BYPASS GRAFT  2006   x 5  . EP IMPLANTABLE DEVICE N/A 05/05/2015   Procedure: Pacemaker Implant;  Surgeon: JThompson Grayer MD;  [facility];  Service: Cardiovascular;  Laterality: N/A;  . FLEXIBLE SIGMOIDOSCOPY N/A 12/07/2015   Procedure: FLEXIBLE SIGMOIDOSCOPY;  Surgeon: VWilford Corner MD;  [facility];  Service: Endoscopy;  Laterality: N/A;  Unprepped  . HERNIA REPAIR    . INSERT / REPLACE / REMOVE PACEMAKER  05/05/2015  . PORTACATH PLACEMENT    . portacath removed    . Redo Median sternotomy, extracorporeal cirulation, AVR, Tricuspid valve repair  06/13/2011   AVR(23-mm Edwards pericardial Magna-Ease valve./ TVrepair (34-mm Edwards MC3 annuloplasty ring  . TOTAL KNEE ARTHROPLASTY Right 05/24/2014   Procedure: RIGHT TOTAL KNEE ARTHROPLASTY;  Surgeon: FGearlean Alf MD;  Location: [facility];  Service: Orthopedics;  Laterality: Right;  .  TRANSURETHRAL RESECTION OF PROSTATE  oct. 2012   TURP  . VASECTOMY  1977   Social History:  reports that he quit smoking about 21 years ago. His smoking use included Pipe and Cigars. He quit after 28.00 years of use. He has never used smokeless tobacco. He reports that he does not drink alcohol or use drugs.  Family / Support Systems Marital Status: Married How Long?: Pt and wife have been together for 658years Patient Roles: Spouse, Parent, Other (Comment) (grandparent; great grandparent; retired iAdministrator, Civil Service Spouse/Significant Other: BAyden Hardwick- wife - [phone]); [phone]) Children: HEfrain Clauson- son - [phone] Supports: CMahamud Metts- other support - [phone]); [phone]) Anticipated Caregiver: wife Ability/Limitations of Caregiver: wife wears braces on her feet, so she is hoping he will get to supervision level.  She is concerned about lifting the heavy PD materials/supplies,  Caregiver Availability: 24/7 Family Dynamics: supportive  family  Social History Preferred language: English Religion: [employer] Education: medical school Read: Yes Write: Yes Employment Status: Retired Date Retired/Disabled/Unemployed: 2000 Age Retired: 57 Public relations account executive Issues: none reported Guardian/Conservator: Pt is not fully able to make his own decisions per MD, so wife will be next of kin decision maker.   Abuse/Neglect Physical Abuse: Denies Verbal Abuse: Denies Sexual Abuse: Denies Exploitation of patient/patient's resources: Denies Self-Neglect: Denies  Emotional Status Pt's affect, behavior and adjustment status: Pt is hopeful about his recovery and his wife is supportive of his rehabilitation and they are both grateful to be on CIR.  Wife is concerned about managing the space and lifting of the PD supplies.  She is trroubleshooting this now.   Recent Psychosocial Issues: Pt/wife knew dialysis was in pt's future.  He's had a lot  of medical issues over the last several years and hasn't really enjoyed his retirement as he was hoping he would. Psychiatric History: none reported Substance Abuse History: none reported  Patient / Family Perceptions, Expectations & Goals Pt/Family understanding of illness & functional limitations: Pt/wife have an understanding of pt's condition and feel they have had their questions answered. Premorbid pt/family roles/activities: Pt likes to be at home and his wife likes to go to the beach.  He sometimes goes with her to the beach. Anticipated changes in roles/activities/participation: Pt would like to resume activities as he is able. Pt/family expectations/goals: Pt/wife would like for pt to reach a supervision level prior to returning home so that wife does not need to do physical care besides PD.  Community [employer] Agencies: None Premorbid Home Care/DME Agencies: Other (Comment) ([facility]) Transportation available at discharge: wife Resource referrals recommended: Neuropsychology  Discharge Planning Living Arrangements: Spouse/significant other Support Systems: Spouse/significant other, Children, Other relatives, Friends/neighbors Type of Residence: Private residence Insurance Resources: [employer], Multimedia programmer (specify) ([employer]) Museum/gallery curator Resources: Radio broadcast assistant Screen Referred: No Money Management: Spouse Does the patient have any problems obtaining your medications?: No Home Management: Pt's wife was doing this. Patient/Family Preliminary Plans: Pt plans to return to his home at d/c with his wife to provide 24/7 supervision.  Pt has stair lift, grab bars, walker; lightweight transport chair; bedside commode; shower chiar, heavy duty rail to get into home. Social Work Anticipated Follow Up Needs: HH/OP Expected length of stay: [facility]OS 14-18 days  Clinical Impression CSW met with pt/wife to introduce self and role of CSW, as  well as to complete assessment.  Pt/wife are pleased to be on CIR.  Wife is very proactive about preparing their home for pt's return.  They have already made changes/modifications throughout the last few years, but now she is preparing for PD at home.  Pt and wife have good family support, but most of the caregiving will fall to wife.  She has hired people in th past to help her care for husband and can do that again, if needed.  Pt had a good day, however today, so she is hopeful they can manage at home.  She stated they may need a new transport chair for pt.  CSW and therapists to address this during pt's stay.  CSW will continue to follow and assist as needed.  Neosha Switalski, Silvestre Mesi 04/19/2017, 3:57PM

## 2017-04-21 NOTE — Progress Notes (Signed)
Occupational Therapy Session Note  Patient Details  Name: [name] STAFFIERI, MD MRN: [id number] Date of Birth: 1941/10/28  Today's Date: 04/21/2017 OT Individual Time:  -       Short Term Goals: Week 1:  OT Short Term Goal 1 (Week 1): Pt will maintain dynamic sitting EOB with supervison during performance of selfcare tasks.  OT Short Term Goal 2 (Week 1): Pt will complete sit to stand for LB bathing and dressing with min assist.  OT Short Term Goal 3 (Week 1): Pt will donn LB clothing over feet with supervision and use of AE. OT Short Term Goal 4 (Week 1): Pt will perform toilet transfer to the elevated toilet with min assist using the RW.  Skilled Therapeutic Interventions/Progress Updates:    1:1. Pt hooked up to PD and awaiting RN to unhook. While waiting pt discuss d/c plans and adaptive equipment used in ADLs such as long handled sponge. Pt agreeable to use long handled sponge in ADL session. After unhooked from dialysis, Pt reports urgency to void bladder. Pt complete ambulatory toilet transfer with RW with CGA and Vc for RW management. Pt able to complete clothing management and hygiene with CGA and Vc for safety awareness. At sink pt grooms in standing with CGA and VC for posture. Pt dons B pants and socks using sock aide seated in w/c and stands to advance pants over hips with supervision. Pt requires Vc for sock aide technique. Exited session with pt seated in w/c set up with breakfast. Therapy Documentation Precautions:  Precautions Precautions: Fall Precaution Comments:   Restrictions Weight Bearing Restrictions: No  Other Treatments:    See Function Navigator for Current Functional Status.   Therapy/Group: Individual Therapy  Tonny [place] 04/21/2017, 9:15 AM

## 2017-04-21 NOTE — Progress Notes (Signed)
Occupational Therapy Session Note  Patient Details   Name: [name] SEGUNDO, MD MRN: [id number] Date of Birth: 1941-06-06  Today's Date: 04/21/2017 OT Individual Time: 1500-1535 OT Individual Time Calculation (min): 35 min   Short Term Goals: Week 1:  OT Short Term Goal 1 (Week 1): Pt will maintain dynamic sitting EOB with supervison during performance of selfcare tasks.  OT Short Term Goal 2 (Week 1): Pt will complete sit to stand for LB bathing and dressing with min assist.  OT Short Term Goal 3 (Week 1): Pt will donn LB clothing over feet with supervision and use of AE. OT Short Term Goal 4 (Week 1): Pt will perform toilet transfer to the elevated toilet with min assist using the RW.  Skilled Therapeutic Interventions/Progress Updates:  Therapeutic activity with focus on functional mobility using SPC, general endurance, BUE strengthening, transfers.   Pt received seated in w/c with RN attending.   Pt requests continued therapy to address general endurance, standing balance & mobility.   Pt advised to walk to rehab gym using SPC, rest, and perform dynamic standing task supported at [employer].   Pt ambulated approx 300 feet with extra time, 2 standing rest breaks, and recovered to raised mat in rehab gym to rest.   Vitals assessed as follows: HR 58 bpm d/t pacemaker, BP 166/64, 98%.   Pt then proceeded to standing task, arm ergometry X 5 min, level 1, random routine.   Pt was only able to sustain rate of 15 rpm d/t old shoulder injury, and travelled equivalent distance of .13 miles.   Pt was escorted back to his room and onto toilet to void urine at end of session.  Pt requires min assist to manage clothing d/t use of briefs.     Therapy Documentation Precautions:  Precautions Precautions: Fall Precaution Comments:   Restrictions Weight Bearing Restrictions: No   Vital Signs: Therapy Vitals Temp: 97.8 F (36.6 C) Temp Source: Oral Pulse Rate: (!) 58 Resp: 18 BP: 118/63 Patient  Position (if appropriate): Sitting Oxygen Therapy SpO2: 98 % O2 Device: Not Delivered   Pain: No/denies pain  See Function Navigator for Current Functional Status.   Therapy/Group: Individual Therapy  [place] 04/21/2017, 3:53 PM

## 2017-04-21 NOTE — Progress Notes (Signed)
Physical Therapy Session Note  Patient Details  Name: [name] PETITJEAN, MD MRN: [id number] Date of Birth: 1941-01-09  Today's Date: 04/21/2017 PT Individual Time: 1118-1202 PT Individual Time Calculation (min): 44 min   Short Term Goals: Week 1:  PT Short Term Goal 1 (Week 1): Pt to sit X 5 minutes without UE support. PT Short Term Goal 2 (Week 1): Pt to perform sit<>stand transfers with min assist. PT Short Term Goal 3 (Week 1): Pt to ambulate 25 ft with rw.  PT Short Term Goal 4 (Week 1): Pt to perform supine>sitting with min assist.    Skilled Therapeutic Interventions/Progress Updates:  Pt received in w/c & agreeable to tx. Pt ambulated room<>gym without AD (pt reports he would rather not use RW) with steady/min assist 2/2 multiple LOB & lateral sway. Pt required several standing rest breaks 2/2 impaired balance & fatigue. Pt also required cuing to minimize furniture grabbing & holding to rail. Pt reports he has a cane at home and would benefit of trial with this AD to see if pt's gait efficiency and balance improves; asked pt's wife to bring in his cane. Provided pt with new OTAGO level A exercise handout as he could not locate the one he received yesterday. Pt performed all OTAGO Level A exercises (except stair walking) with parallel bar for UE support with instructional cuing for technique. Discussed ways for pt to perform exercises in safe manner at home. At end of session pt left sitting in recliner in room with family present to supervise.   Therapy Documentation Precautions:  Precautions Precautions: Fall Precaution Comments:   Restrictions Weight Bearing Restrictions: No  Pain: No c/o pain noted.  See Function Navigator for Current Functional Status.   Therapy/Group: Individual Therapy  Waunita Schooner 04/21/2017, 12:26 PM

## 2017-04-21 NOTE — Progress Notes (Signed)
Patient ID: [name], MD, male   DOB: July 23, 1941, 76 y.o.   MRN: [id number] S:He had a great night last night with tramadol 100mg  x 1 an hour before CCPD started and 2 tylenol without cramping last night. O:BP 138/79 (BP Location: Right Arm)   Pulse (!) 59   Temp 98 F (36.7 C) (Oral)   Resp 18   Ht 5\' 11"  (1.803 m)   Wt 99.5 kg (219 lb 5.7 oz)   SpO2 99%   BMI 30.59 kg/m   Intake/Output Summary (Last 24 hours) at 04/21/17 1210 Last data filed at 04/21/17 1015  Gross per 24 hour  Intake             8243 ml  Output             9271 ml  Net            -1028 ml   Intake/Output: I/O last 3 completed shifts: In: 8275 [P.O.:750; Other:7525] Out: 6553 [Urine:701; Other:8944]  Intake/Output this shift:  Total I/O In: 7763 [P.O.:240; Other:7523] Out: 8921 [Other:8921] Weight change:  Gen:WD obese WM in NAD CVS:no rub, II/VI SEM at LUSB with metallic click Resp:cta Abd:+BS, soft, mildly tender Ext:2+ lower extremity edema   Recent Labs Lab 04/16/17 0245 04/18/17 0610 04/19/17 0838 04/21/17 0519  NA 136 137 135 138  K 3.4* 3.8 4.1 4.1  CL 98* 99* 99* 104  CO2 27 29 28 25   GLUCOSE 219* 203* 166* 195*  BUN 67* 53* 47* 43*  CREATININE 2.12* 2.08* 2.03* 2.12*  ALBUMIN 2.4* 2.4* 2.5* 2.4*  CALCIUM 8.4* 8.8* 8.7* 8.6*  PHOS 3.1 3.4 3.1 3.6  AST 23 19  --   --   ALT 10* 12*  --   --    Liver Function Tests:  Recent Labs Lab 04/16/17 0245 04/18/17 0610 04/19/17 0838 04/21/17 0519  AST 23 19  --   --   ALT 10* 12*  --   --   ALKPHOS 95 94  --   --   BILITOT 1.9* 1.8*  --   --   PROT 5.4* 5.5*  --   --   ALBUMIN 2.4* 2.4* 2.5* 2.4*   No results for input(s): LIPASE, AMYLASE in the last 168 hours.  Recent Labs Lab 04/16/17 0245 04/17/17 0512 04/19/17 0838  AMMONIA 81* 60* 48*   CBC:  Recent Labs Lab 04/16/17 0245 04/18/17 0610 04/21/17 0519  WBC 5.9 4.6 3.6*  HGB 12.3* 13.1 12.8*  HCT 37.0* 39.8 38.6*  MCV 93.7 95.7 94.8  PLT 82* 87* 116*    Cardiac Enzymes: No results for input(s): CKTOTAL, CKMB, CKMBINDEX, TROPONINI in the last 168 hours. CBG:  Recent Labs Lab 04/20/17 1657 04/20/17 1744 04/20/17 2100 04/21/17 0634 04/21/17 1117  GLUCAP 45* 144* 208* 160* 165*    Iron Studies: No results for input(s): IRON, TIBC, TRANSFERRIN, FERRITIN in the last 72 hours. Studies/Results: No results found. [name] allopurinol  100 mg Oral Q breakfast  . feeding supplement (NEPRO CARB STEADY)  237 mL Oral BID BM  . feeding supplement (PRO-STAT SUGAR FREE 64)  30 mL Oral BID  . gentamicin cream  1 application Topical Daily  . insulin aspart  0-9 Units Subcutaneous TID WC  . lactulose  20 g Oral BID  . loratadine  10 mg Oral Daily  . metoprolol  50 mg Oral BID  . mupirocin cream   Topical BID  . potassium chloride  40 mEq  Oral BID  . terazosin  5 mg Oral BH-q7a  . torsemide  40 mg Oral Daily  . Warfarin - Pharmacist Dosing Inpatient   Does not apply q1800  . zolpidem  10 mg Oral QHS    BMET    Component Value Date/Time   NA 138 04/21/2017 0519   K 4.1 04/21/2017 0519   CL 104 04/21/2017 0519   CO2 25 04/21/2017 0519   GLUCOSE 195 (H) 04/21/2017 0519   BUN 43 (H) 04/21/2017 0519   CREATININE 2.12 (H) 04/21/2017 0519   CREATININE 2.67 (H) 06/21/2016 1517   CALCIUM 8.6 (L) 04/21/2017 0519   CALCIUM 8.1 (L) 06/16/2011 0300   GFRNONAA 28 (L) 04/21/2017 0519   GFRAA 33 (L) 04/21/2017 0519   CBC    Component Value Date/Time   WBC 3.6 (L) 04/21/2017 0519   RBC 4.07 (L) 04/21/2017 0519   HGB 12.8 (L) 04/21/2017 0519   HGB 11.2 (L) 12/02/2007 0935   HCT 38.6 (L) 04/21/2017 0519   HCT 31.9 (L) 12/02/2007 0935   PLT 116 (L) 04/21/2017 0519   PLT 166 12/02/2007 0935   MCV 94.8 04/21/2017 0519   MCV 87.0 12/02/2007 0935   MCH 31.4 04/21/2017 0519   MCHC 33.2 04/21/2017 0519   RDW 16.6 (H) 04/21/2017 0519   RDW 16.6 (H) 12/02/2007 0935   LYMPHSABS 1.1 04/08/2017 0337   LYMPHSABS 0.4 (L) 12/02/2007 0935   MONOABS 0.3  04/08/2017 0337   MONOABS 0.5 12/02/2007 0935   EOSABS 0.1 04/08/2017 0337   EOSABS 0.0 12/02/2007 0935   BASOSABS 0.0 04/08/2017 0337   BASOSABS 0.0 12/02/2007 0935    1. ESRD- due to cardiorenal syndrome with underlying DM and HTN nephrosclerosis.   1. Doing well with CCPD 2 liter fill volumes, fill time 2. 2 weeks out from PD catheter placement so should be ok for ambulatory PD, however he is doing very well with CCPD. 2. Right sided CHF with scrotal swelling. CT scan negative for hernia or leak. Slowly losing weight with UF. Cont to monitor daily weights. 3. PD catheter- I spoke with Dr. Raul Del regarding removing the sutures from the surgery and should be ok to do this week. PD cath functioning well.  Placed 04/05/17 4. Anemia: stable 5. CKD-MBD: stable 6. Nutrition: renal diet, low K cont with supplementation and change diet without potassium restriction 7. Insomnia- doing well with ambien (home med) 10mg   8. Hypertension: stable 9. OSA on CPAP 10. 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  Hopefully with improved volume, hepatic congestion will not be an issue moving forward. 11. Deconditioning- PT/OT and CIR.  Doing well.  12. Disposition- will need to know discharge date so we can schedule hometraining for he and his wife to continue with CCPD.  Donetta Potts, MD [employer] [phone]

## 2017-04-21 NOTE — Progress Notes (Addendum)
[facility] PHYSICAL MEDICINE & REHABILITATION     PROGRESS NOTE  Subjective/Complaints:  Discussed pt with Nephrology (Dr Arty Baumgartner) would like to set up home PD training and requests d/c date Discussed low CBG with pt and RN ROS: +Diarrhea per pt. Denies CP, SOB, N/V/D.  Objective: Vital Signs: Blood pressure 138/79, pulse (!) 59, temperature 98 F (36.7 C), temperature source Oral, resp. rate 18, height 5\' 11"  (1.803 m), weight 99.5 kg (219 lb 5.7 oz), SpO2 99 %. No results found.  Recent Labs  04/21/17 0519  WBC 3.6*  HGB 12.8*  HCT 38.6*  PLT 116*    Recent Labs  04/19/17 0838 04/21/17 0519  NA 135 138  K 4.1 4.1  CL 99* 104  GLUCOSE 166* 195*  BUN 47* 43*  CREATININE 2.03* 2.12*  CALCIUM 8.7* 8.6*   CBG (last 3)   Recent Labs  04/20/17 1744 04/20/17 2100 04/21/17 0634  GLUCAP 144* 208* 160*    Wt Readings from Last 3 Encounters:  04/21/17 99.5 kg (219 lb 5.7 oz)  04/16/17 99.8 kg (220 lb 0.3 oz)  03/02/17 99.8 kg (220 lb)    Physical Exam:  BP 138/79 (BP Location: Right Arm)   Pulse (!) 59   Temp 98 F (36.7 C) (Oral)   Resp 18   Ht 5\' 11"  (1.803 m)   Wt 99.5 kg (219 lb 5.7 oz)   SpO2 99%   BMI 30.59 kg/m  Constitutional: He appears well-developed. NAD. HENT: Normocephalic and atraumatic.  Eyes: EOMI. No discharge.  Cardiovascular: IRRR. No JVD. +Murmur. Respiratory: Effort normal and breath sounds normal.  GI: Soft. Bowel sounds are normal.  PD catheter noted.  Neurological:  Follows simple commands.  STM deficits.  Motor: 4-/5 throughout (stable) Skin.warm and dry. Dry dressing to LUE.   Some scattered bruising around abdomen.  Psych: Normal mood and affect.  Assessment/Plan: 1. Functional deficits secondary to debility which require 3+ hours per day of interdisciplinary therapy in a comprehensive inpatient rehab setting. Physiatrist is providing close team supervision and 24 hour management of active medical problems listed  below. Physiatrist and rehab team continue to assess barriers to discharge/monitor patient progress toward functional and medical goals.  Function:  Bathing Bathing position   Position: Wheelchair/chair at sink  Bathing parts Body parts bathed by patient: Right arm, Left arm, Chest, Abdomen, Front perineal area, Buttocks Body parts bathed by helper: Right lower leg, Left lower leg, Buttocks, Back  Bathing assist Assist Level: 2 helpers      Upper Body Dressing/Undressing Upper body dressing   What is the patient wearing?: Pull over shirt/dress     Pull over shirt/dress - Perfomed by patient: Thread/unthread right sleeve, Thread/unthread left sleeve, Put head through opening, Pull shirt over trunk Pull over shirt/dress - Perfomed by helper: Pull shirt over trunk        Upper body assist Assist Level: Supervision or verbal cues      Lower Body Dressing/Undressing Lower body dressing   What is the patient wearing?: Socks, Shoes   Underwear - Performed by helper: Thread/unthread right underwear leg, Thread/unthread left underwear leg, Pull underwear up/down Pants- Performed by patient: Thread/unthread right pants leg, Thread/unthread left pants leg, Pull pants up/down Pants- Performed by helper: Thread/unthread right pants leg, Thread/unthread left pants leg, Pull pants up/down   Non-skid slipper socks- Performed by helper: Don/doff right sock, Don/doff left sock Socks - Performed by patient: Don/doff right sock   Shoes - Performed by patient: Don/doff  right shoe, Don/doff left shoe            Lower body assist Assist for lower body dressing: Assistive device, Supervision or verbal cues (shoe funnel, sock aide)      Toileting Toileting   Toileting steps completed by patient: Adjust clothing prior to toileting, Performs perineal hygiene, Adjust clothing after toileting Toileting steps completed by helper: Adjust clothing after toileting Toileting Assistive Devices: Grab  bar or rail  Toileting assist Assist level: Touching or steadying assistance (Pt.75%)   Transfers Chair/bed transfer   Chair/bed transfer method: Stand pivot Chair/bed transfer assist level: Moderate assist (Pt 50 - 74%/lift or lower) Chair/bed transfer assistive device: Mechanical lift Mechanical lift: Ecologist     Max distance: 200 Assist level: Supervision or verbal cues   Wheelchair   Type: Manual Max wheelchair distance: 50 ft Assist Level: Supervision or verbal cues  Cognition Comprehension Comprehension assist level: Understands basic 90% of the time/cues < 10% of the time  Expression Expression assist level: Expresses basic 90% of the time/requires cueing < 10% of the time.  Social Interaction Social Interaction assist level: Interacts appropriately 90% of the time - Needs monitoring or encouragement for participation or interaction.  Problem Solving Problem solving assist level: Solves basic 90% of the time/requires cueing < 10% of the time  Memory Memory assist level: Recognizes or recalls 90% of the time/requires cueing < 10% of the time    Medical Problem List and Plan:  1. Weakness with cognitive deficits secondary to hepatic encephalopathy and multiple medical issues   Cont CIR PT, OT, speech therapy tent d/c 5/8, sent staff message to Dr Arty Baumgartner with d/c date to coordinate 2. DVT Prophylaxis/Anticoagulation: Chronic Coumadin. Monitor for rebleeding episodes   INR therapeutic 4/29 3. Pain Management: Valium 10 mg daily as needed, Ultram 100 mg every 6 hours as needed  4. Mood: Provide emotional support  5. Neuropsych: This patient is not fully capable of making decisions on his own behalf.  6. Skin/Wound Care: Cleanse wounds to left hand Apply Bactroban Vaseline gauze for atraumatic dressing removed. Wrap with Kerlix changed daily  7. Fluids/Electrolytes/Nutrition: Routine I&Os 8. ESRD/CPPD. Status post peritoneal dialysis catheter  04/05/2017. Follow-up renal services  9. 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 Monitor for any signs of fluid  Filed Weights   04/18/17 0734 04/20/17 1910 04/21/17 0538  Weight: 101.2 kg (223 lb 1.7 oz) 100.6 kg (221 lb 12.5 oz) 99.5 kg (219 lb 5.7 oz)   10. 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 Status post paracentesis 03/06/2017. Continue lactulose.   Ammonia 60 on 4/25, 48 on 4/27, patient had multiple stools yesterday 11. Atrial fibrillation. Continue Coumadin. Cardiac rate control  12. Hypertension. Lopressor 50 mg twice a day, Hytrin 5 mg daily, Demadex 40 mg daily   Monitor with increased mobility  Relatively controlled 4/28 13. Anemia of chronic disease.   Hb 13.1 on 4/26  Cont to monitor 14. History of gout. Allopurinol 100 mg daily. Monitor for any gout flareups  15. DM type 2  Monitor ,controlled 4/27 16. Fecal incontinence and diarrhea, this is related to his Chronulac plus immobility, patient wishes to hold off on Chronulac for now.    CBG (last 3)   Recent Labs  04/20/17 1744 04/20/17 2100 04/21/17 0634  GLUCAP 144* 208* 160*    16. Thrombocytopenia -no clincal signs of bleeding  Plts 87 on 4/26  Cont to monitor 17. Hypoalbuminemia  Supplement initiated  4/26 18. Obesity  Body mass index is 30.59 kg/m.  Diet and exercise education  Encourage weight loss to increase endurance and promote overall health   LOS (Days) 5 A FACE TO FACE EVALUATION WAS PERFORMED  Alysia Penna E 04/21/2017 10:19 AM

## 2017-04-21 NOTE — Progress Notes (Signed)
Physical Therapy Session Note  Patient Details  Name: [name] ZAPANTA, MD MRN: [id number] Date of Birth: 1941-01-10  Today's Date: 04/21/2017 PT Individual Time: 1400-1500 PT Individual Time Calculation (min): 60 min   Short Term Goals: Week 1:  PT Short Term Goal 1 (Week 1): Pt to sit X 5 minutes without UE support. PT Short Term Goal 2 (Week 1): Pt to perform sit<>stand transfers with min assist. PT Short Term Goal 3 (Week 1): Pt to ambulate 25 ft with rw.  PT Short Term Goal 4 (Week 1): Pt to perform supine>sitting with min assist.    Skilled Therapeutic Interventions/Progress Updates:  Pt was seen bedside in the pm. Pt performed all sit to stand transfers with st cane and S. Pt ambulated 150 feet with c/s to min guard, utilizing a st cane and multiple standing rest breaks. In gym treatment focused on LE strengthening. Pt performed stair taps 3 sets x 10 reps each followed by step taps 3 sets x 10 reps each utilizing only st cane for balance and assist of therapist as needed. Pt requires multiple rest breaks. Pt returned to room and left sitting up in w/c with call bell within reach.   Therapy Documentation Precautions:  Precautions Precautions: Fall Precaution Comments:   Restrictions Weight Bearing Restrictions: No General:  Pain: No c/o pain.   See Function Navigator for Current Functional Status.   Therapy/Group: Individual Therapy  Dub Amis 04/21/2017, 3:16 PM

## 2017-04-22 ENCOUNTER — Inpatient Hospital Stay (HOSPITAL_COMMUNITY): Payer: Medicare Other | Admitting: Speech Pathology

## 2017-04-22 ENCOUNTER — Inpatient Hospital Stay (HOSPITAL_COMMUNITY): Payer: Medicare Other | Admitting: Physical Therapy

## 2017-04-22 ENCOUNTER — Inpatient Hospital Stay (HOSPITAL_COMMUNITY): Payer: Medicare Other

## 2017-04-22 DIAGNOSIS — E7220 Disorder of urea cycle metabolism, unspecified: Secondary | ICD-10-CM

## 2017-04-22 DIAGNOSIS — R5381 Other malaise: Principal | ICD-10-CM

## 2017-04-22 LAB — RENAL FUNCTION PANEL
ANION GAP: 7 (ref 5–15)
Albumin: 2.5 g/dL — ABNORMAL LOW (ref 3.5–5.0)
BUN: 45 mg/dL — ABNORMAL HIGH (ref 6–20)
CO2: 27 mmol/L (ref 22–32)
Calcium: 8.5 mg/dL — ABNORMAL LOW (ref 8.9–10.3)
Chloride: 103 mmol/L (ref 101–111)
Creatinine, Ser: 2.42 mg/dL — ABNORMAL HIGH (ref 0.61–1.24)
GFR calc Af Amer: 28 mL/min — ABNORMAL LOW (ref >60)
GFR calc non Af Amer: 24 mL/min — ABNORMAL LOW (ref >60)
GLUCOSE: 161 mg/dL — AB (ref 65–99)
POTASSIUM: 4.2 mmol/L (ref 3.5–5.1)
Phosphorus: 3.7 mg/dL (ref 2.5–4.6)
Sodium: 137 mmol/L (ref 135–145)

## 2017-04-22 LAB — GLUCOSE, CAPILLARY
GLUCOSE-CAPILLARY: 132 mg/dL — AB (ref 65–99)
GLUCOSE-CAPILLARY: 190 mg/dL — AB (ref 65–99)
Glucose-Capillary: 184 mg/dL — ABNORMAL HIGH (ref 65–99)
Glucose-Capillary: 115 mg/dL — ABNORMAL HIGH (ref 65–99)
Glucose-Capillary: 95 mg/dL (ref 65–99)

## 2017-04-22 LAB — AMMONIA: Ammonia: 39 umol/L — ABNORMAL HIGH (ref 9–35)

## 2017-04-22 LAB — PROTIME-INR
INR: 2.3
Prothrombin Time: 25.7 seconds — ABNORMAL HIGH (ref 11.4–15.2)

## 2017-04-22 MED ORDER — POTASSIUM CHLORIDE CRYS ER 20 MEQ PO TBCR
30.0000 meq | EXTENDED_RELEASE_TABLET | Freq: Every day | ORAL | Status: DC
  Administered 2017-04-27: 30 meq via ORAL
  Filled 2017-04-22 (×4): qty 1

## 2017-04-22 MED ORDER — WARFARIN SODIUM 1 MG PO TABS
1.5000 mg | ORAL_TABLET | Freq: Once | ORAL | Status: AC
  Administered 2017-04-22: 1.5 mg via ORAL
  Filled 2017-04-22: qty 1

## 2017-04-22 MED ORDER — ACETAMINOPHEN 500 MG PO TABS
1000.0000 mg | ORAL_TABLET | Freq: Every day | ORAL | Status: DC
  Administered 2017-04-22 – 2017-04-26 (×5): 1000 mg via ORAL
  Filled 2017-04-22 (×5): qty 2

## 2017-04-22 NOTE — Progress Notes (Signed)
Speech Language Pathology Daily Session Note  Patient Details  Name: [name] WOODCOX, [name] MRN: [id number] Date of Birth: February 06, 1941  Today's Date: 04/22/2017 SLP Individual Time: 1430-1500 SLP Individual Time Calculation (min): 30 min  Short Term Goals: Week 1: SLP Short Term Goal 1 (Week 1): Pt will recall complex, new information with supervision verbal cues for use of external aids.   SLP Short Term Goal 2 (Week 1): Pt will complete complex tasks with supervision verbal cues for functional problem solving.   SLP Short Term Goal 3 (Week 1): Pt will recognize and correct errors during functional tasks with supervision verbal cues.    Skilled Therapeutic Interventions: Skilled treatment session focused on cognitive goals. Upon arrival, patient was getting his haircut. Both the patient and his wife reported that they knew ST was coming but his haircut was important. Therefore, patient unable to participate in an actual cognitive task but participated in a functional conversation that focused on awareness of both his physical and cognitive deficits. Patient very open about his physical deficits but reported he felt like this "mental abilities" were normal and that he "failed mazes in kindergarten and that he can't draw numbers on a clock because he only uses military time." Wife present and did not endorse cognitive changes or engage in conversation. Patient left upright in wheelchair with family present. Continue with current plan of care.      Function:  Cognition Comprehension Comprehension assist level: Follows basic conversation/direction with no assist  Expression   Expression assist level: Expresses basic needs/ideas: With no assist  Social Interaction Social Interaction assist level: Interacts appropriately 90% of the time - Needs monitoring or encouragement for participation or interaction.  Problem Solving Problem solving assist level: Solves basic problems with no assist  Memory  Memory assist level: Recognizes or recalls 90% of the time/requires cueing < 10% of the time    Pain No/Denies Pain   Therapy/Group: Individual Therapy  [name] 04/22/2017, 3:52 PM

## 2017-04-22 NOTE — Progress Notes (Signed)
Physical Therapy Session Note  Patient Details  Name: [name] WERY, MD MRN: [id number] Date of Birth: [birth year]  Today's Date: 04/22/2017 PT Individual Time: 0932-1034, 1300-1400 PT Individual Time Calculation (min): 62 min, 60 min  Short Term Goals: Week 1:  PT Short Term Goal 1 (Week 1): Pt to sit X 5 minutes without UE support. PT Short Term Goal 2 (Week 1): Pt to perform sit<>stand transfers with min assist. PT Short Term Goal 3 (Week 1): Pt to ambulate 25 ft with rw.  PT Short Term Goal 4 (Week 1): Pt to perform supine>sitting with min assist.    Skilled Therapeutic Interventions/Progress Updates:    Session 1: Pt up in w/c with nursing getting cleaned up at sink. Pt agreeable to PT session. Pt requesting to change pants and put regular underwear briefs on. Pt standing from w/c with rw and able to pull pants down to knees. Sitting to fully remove pajamas. While sitting, pt able to don underpants and sweatpants onto LE and pull up to knees. Pt then standing again and pulling garments up the remainder of the way- all performed with supervision. Ambulation: pt ambulating 150 ft with SPC and close supervision X2 (holding cup of water on second attempt) Taking standing breaks as needed. In gym pt ambulating with rw 150' with distant supervision and good stability. Following session, pt returned to room and requesting return to bed to rest. Pt with all needs in reach. During discussion of D/C, pt reports that he is planning to go home and walk his dog outside in the driveway. Additionally planning to do some yardwork and moving some plants around inside. Discussed safety concerns regarding this.  Session 2: Pt in room with spouse, sitting in recliner. Pt is agreeable to PT session but states that he is fatigued already. Discussion regarding D/C had with pt and spouse. Spouse reporting that she needs the pt to be as independent as possible prior to D/C but will be able to provide supervision.  Also discussed need for continued exercise and activity (safety) upon return home. Pt reporting need to use bathroom prior to leaving room. Pt ambulating to bathroom using SPC and supervision. Following, pt able to cleanse himself and stand at sink to wash hands. Following seated rest, able to progress to remainder of session. Ambulation performed X 130 ft X2 (carpeted), 60 ft X2 using SPC and supervision. Pt having 1 LOB with CGA recovery when scanning for garbage can in standing. Transfers: sit<>stand performed from toilet, recliner, couch. Repeating sit<>stand from couch X3 with cue for sequence. Increased time and UE assist needed from lower surface. Following session pt sitting in w/c with spouse present and all needs in reach.   Therapy Documentation Precautions:  Precautions Precautions: Fall Precaution Comments:   Restrictions Weight Bearing Restrictions: No Pain:  1.5/10 achy in arms  See Function Navigator for Current Functional Status.   Therapy/Group: Individual Therapy  Linard Millers, PT 04/22/2017, 9:36 AM

## 2017-04-22 NOTE — Progress Notes (Signed)
Occupational Therapy Session Note  Patient Details  Name: JANES COLEGROVE, MD MRN: [id number] Date of Birth: January 03, 1941  Today's Date: 04/22/2017 OT Individual Time: 1100-1205 OT Individual Time Calculation (min): 65 min    Short Term Goals: Week 1:  OT Short Term Goal 1 (Week 1): Pt will maintain dynamic sitting EOB with supervison during performance of selfcare tasks.  OT Short Term Goal 2 (Week 1): Pt will complete sit to stand for LB bathing and dressing with min assist.  OT Short Term Goal 3 (Week 1): Pt will donn LB clothing over feet with supervision and use of AE. OT Short Term Goal 4 (Week 1): Pt will perform toilet transfer to the elevated toilet with min assist using the RW.  Skilled Therapeutic Interventions/Progress Updates:    Pt resting in w/c upon arrival with wife present.  Pt stated he had completed bathing/dressing prior to therapy this morning.  Pt amb with SPC to therapy gym and initially engaged in dynamic standing balance activities on compliant surface (contact guard) while reaching for objects with RUE and tossing at target.  Pt retrieved items from floor with reacher.  Pt transitioned to ADL apartment and practiced bed mobility (sit<>supine) at supervision level.  Pt amb with SPC back to room with increased standing rest breaks to "catch breath" and entered bathroom.  Pt completed toileting tasks at supervision level and returned to room and recliner.  Pt's wife present for therapy session.  Pt's wife stated that she would like pt to be able to complete all BADLs without assistance prior to returning home.  Focus on activity tolerance, functional amb with SPC, standing balance, and safety awareness to increase independence with BADLs.   Therapy Documentation Precautions:  Precautions Precautions: Fall Precaution Comments:   Restrictions Weight Bearing Restrictions: No Pain:  Pt denied pain  See Function Navigator for Current Functional  Status.   Therapy/Group: Individual Therapy  Leroy Libman 04/22/2017, 12:07 PM

## 2017-04-22 NOTE — Progress Notes (Signed)
[facility]ILIT[facility]     PROGRESS NOTE  Subjective/Complaints:  Pt seen laying in bed this AM.  He slept well overnight.  He has several questions that appear to have been addressed by several other physicians.  He is assertive that he would like to go home. He states he can do more therapy at home than he is getting here.   ROS: Denies CP, SOB, N/V/D.  Objective: Vital Signs: Blood pressure 135/75, pulse 67, temperature 97.5 F (36.4 C), temperature source Oral, resp. rate 17, height 5\' 11"  (1.803 m), weight 98.1 kg (216 lb 4.3 oz), SpO2 100 %. No results found.  Recent Labs  04/21/17 0519  WBC 3.6*  HGB 12.8*  HCT 38.6*  PLT 116*    Recent Labs  04/21/17 0519 04/22/17 0702  NA 138 137  K 4.1 4.2  CL 104 103  GLUCOSE 195* 161*  BUN 43* 45*  CREATININE 2.12* 2.42*  CALCIUM 8.6* 8.5*   CBG (last 3)   Recent Labs  04/21/17 1644 04/21/17 2118 04/22/17 0615  GLUCAP 82 182* 184*    Wt Readings from Last 3 Encounters:  04/22/17 98.1 kg (216 lb 4.3 oz)  04/16/17 99.8 kg (220 lb 0.3 oz)  03/02/17 99.8 kg (220 lb)    Physical Exam:  BP 135/75 (BP Location: Right Arm)   Pulse 67   Temp 97.5 F (36.4 C) (Oral)   Resp 17   Ht 5\' 11"  (1.803 m)   Wt 98.1 kg (216 lb 4.3 oz)   SpO2 100%   BMI 30.16 kg/m  Constitutional: He appears well-developed. NAD. HENT: Normocephalic and atraumatic.  Eyes: EOMI. No discharge.  Cardiovascular: RRRR. No JVD. +Murmur. Respiratory: Effort normal and breath sounds normal.  GI: Soft. Bowel sounds are normal.  PD catheter noted.  Neurological:  Follows simple commands.  STM deficits.  Motor: 4-/5 throughout (improving) Skin.warm and dry. Dry dressing to LUE.  Some scattered bruising around abdomen.  Psych: Normal mood and affect.  Assessment/Plan: 1. Functional deficits secondary to debility which require 3+ hours per day of interdisciplinary therapy in a comprehensive inpatient rehab  setting. Physiatrist is providing close team supervision and 24 hour management of active medical problems listed below. Physiatrist and rehab team continue to assess barriers to discharge/monitor patient progress toward functional and medical goals.  Function:  Bathing Bathing position   Position: Wheelchair/chair at sink  Bathing parts Body parts bathed by patient: Right arm, Left arm, Chest, Abdomen, Front perineal area, Buttocks Body parts bathed by helper: Right lower leg, Left lower leg, Buttocks, Back  Bathing assist Assist Level: 2 helpers      Upper Body Dressing/Undressing Upper body dressing   What is the patient wearing?: Pull over shirt/dress     Pull over shirt/dress - Perfomed by patient: Thread/unthread right sleeve, Thread/unthread left sleeve, Put head through opening, Pull shirt over trunk Pull over shirt/dress - Perfomed by helper: Pull shirt over trunk        Upper body assist Assist Level: Touching or steadying assistance(Pt > 75%) (standing)      Lower Body Dressing/Undressing Lower body dressing   What is the patient wearing?: Non-skid slipper socks, Pants   Underwear - Performed by helper: Thread/unthread right underwear leg, Thread/unthread left underwear leg, Pull underwear up/down Pants- Performed by patient: Thread/unthread right pants leg, Thread/unthread left pants leg, Pull pants up/down Pants- Performed by helper: Thread/unthread right pants leg, Thread/unthread left pants leg, Pull pants up/down Non-skid slipper  socks- Performed by patient: Don/doff right sock, Don/doff left sock Non-skid slipper socks- Performed by helper: Don/doff right sock, Don/doff left sock Socks - Performed by patient: Don/doff right sock   Shoes - Performed by patient: Don/doff right shoe, Don/doff left shoe            Lower body assist Assist for lower body dressing:  (sock aide)      Toileting Toileting   Toileting steps completed by patient: Adjust clothing  prior to toileting, Performs perineal hygiene, Adjust clothing after toileting Toileting steps completed by helper: Adjust clothing after toileting Toileting Assistive Devices: Grab bar or rail  Toileting assist Assist level: Touching or steadying assistance (Pt.75%)   Transfers Chair/bed transfer   Chair/bed transfer method: Stand pivot Chair/bed transfer assist level: Touching or steadying assistance (Pt > 75%) Chair/bed transfer assistive device: Film/video editor lift: Ecologist     Max distance: 150 Assist level: Touching or steadying assistance (Pt > 75%)   Wheelchair   Type: Manual Max wheelchair distance: 50 ft Assist Level: Supervision or verbal cues  Cognition Comprehension Comprehension assist level: Follows basic conversation/direction with no assist  Expression Expression assist level: Expresses basic needs/ideas: With no assist  Social Interaction Social Interaction assist level: Interacts appropriately 90% of the time - Needs monitoring or encouragement for participation or interaction.  Problem Solving Problem solving assist level: Solves basic problems with no assist  Memory Memory assist level: Recognizes or recalls 90% of the time/requires cueing < 10% of the time    Medical Problem List and Plan:  1. Weakness with cognitive deficits secondary to hepatic encephalopathy and multiple medical issues   Cont CIR   Weekend notes reviewed 2. DVT Prophylaxis/Anticoagulation: Chronic Coumadin. Monitor for rebleeding episodes   INR therapeutic 4/30 3. Pain Management: Valium 10 mg daily as needed, Ultram 100 mg every 6 hours as needed  4. Mood: Provide emotional support  5. Neuropsych: This patient is not fully capable of making decisions on his own behalf.  6. Skin/Wound Care: Cleanse wounds to left hand Apply Bactroban Vaseline gauze for atraumatic dressing removed. Wrap with Kerlix changed daily  7. Fluids/Electrolytes/Nutrition: Routine I&Os 8.  ESRD/CPPD. Status post peritoneal dialysis catheter 04/05/2017. Follow-up renal services  9. 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 Monitor for any signs of fluid  Filed Weights   04/21/17 0538 04/21/17 1950 04/22/17 0518  Weight: 99.5 kg (219 lb 5.7 oz) 98 kg (216 lb 0.8 oz) 98.1 kg (216 lb 4.3 oz)   10. 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 Status post paracentesis 03/06/2017. Continue lactulose.   Ammonia 39 on 4/30  Patient refusing Chronulac. 11. Atrial fibrillation. Continue Coumadin. Cardiac rate control  12. Hypertension. Lopressor 50 mg twice a day, Hytrin 5 mg daily, Demadex 40 mg daily   Monitor with increased mobility  Relatively controlled 4/30 13. Anemia of chronic disease.   Hb 12.8 on 4/29  Cont to monitor 14. History of gout. Allopurinol 100 mg daily. Monitor for any gout flareups  15. DM type 2  Monitor             Amaryl d/ced, pt refusing stating bad reaction in past  Labile 4/30 16. Thrombocytopenia   Plts 116 on 4/29  Cont to monitor 17. Hypoalbuminemia  Supplement initiated 4/26 18. Obesity  Body mass index is 30.16 kg/m.  Diet and exercise education  Encourage weight loss to increase endurance and promote overall health   LOS (Days) 6  A FACE TO FACE EVALUATION WAS PERFORMED  Lindsi Bayliss Lorie Phenix 04/22/2017 9:38 AM

## 2017-04-22 NOTE — Progress Notes (Signed)
ANTICOAGULATION CONSULT NOTE - Follow Up Consult  Pharmacy Consult for coumadin Indication: atrial fibrillation  Allergies  Allergen Reactions  . Actos [Pioglitazone Hydrochloride] Swelling and Other (See Comments)    Severe peripheral edema  . Codeine Other (See Comments)    Makes Pt aggressive  . Depakote [Divalproex Sodium] Diarrhea    severe  . Diltiazem Other (See Comments)    lethargy and dyspnea  . Hydralazine Other (See Comments)    Severe chills and SOB   . Isosorbide Dinitrate Other (See Comments)    Severe chills and SOB   . Januvia [Sitagliptin] Diarrhea and Other (See Comments)    bradycardia  . Loop Diuretics Other (See Comments)    Spike in BUN and creatine levels  . Losartan Other (See Comments)    aphasia  . Nsaids Other (See Comments)    Renal problems  . Onglyza [Saxagliptin] Diarrhea and Other (See Comments)    bradycardia  . Orudis [Ketoprofen] Other (See Comments)    Cannot take due to renal insufficiency  . Penicillins Swelling and Other (See Comments)    Serum sickness Has patient had a PCN reaction causing immediate rash, facial/tongue/throat swelling, SOB or lightheadedness with hypotension: Yes Has patient had a PCN reaction causing severe rash involving mucus membranes or skin necrosis: No Has patient had a PCN reaction that required hospitalization Yes Has patient had a PCN reaction occurring within the last 10 years: No If all of the above answers are "NO", then may proceed with Cephalosporin use.   [name] Potassium Iodide Itching and Rash    Severe entire body itching and rash  . Rozerem [Ramelteon] Diarrhea    Severe   . Amiodarone Other (See Comments)    diarrhea  . Latex Swelling  . Verapamil Other (See Comments)    Severe fatigue  . Benazepril Hcl Other (See Comments)    ? Possible lowers platelets  . Indomethacin Other (See Comments)    Renal problems   . Minocycline Other (See Comments)    Makes dizzy and felt just lousy.  [name]  Pentazocine Lactate Other (See Comments)    Unknown allergic reaction - pt and wife do not recall this  . Poison Ivy Extract [Poison Ivy Extract] Rash and Other (See Comments)    blisters  . Sertraline Hcl Other (See Comments)    Pt and wife do not recall this  . Shellfish Allergy Rash    Patient Measurements: Height: 5\' 11"  (180.3 cm) Weight: 216 lb 4.3 oz (98.1 kg) IBW/kg (Calculated) : 75.3 Heparin Dosing Weight:   Vital Signs: Temp: 97.5 F (36.4 C) (04/30 0808) Temp Source: Oral (04/30 0808) BP: 135/75 (04/30 0808) Pulse Rate: 67 (04/30 0808)  Labs:  Recent Labs  04/19/17 6301 04/20/17 0508 04/21/17 0519 04/22/17 0702  HGB  --   --  12.8*  --   HCT  --   --  38.6*  --   PLT  --   --  116*  --   LABPROT 37.5* 30.9* 27.4* 25.7*  INR 3.69 2.90 2.49 2.30  CREATININE 2.03*  --  2.12* 2.42*    Estimated Creatinine Clearance: 30.5 mL/min (A) (by C-G formula based on SCr of 2.42 mg/dL (H)).   Medications:  Scheduled:  . acetaminophen  650 mg Oral q1800  . allopurinol  100 mg Oral Q breakfast  . feeding supplement (NEPRO CARB STEADY)  237 mL Oral BID BM  . feeding supplement (PRO-STAT SUGAR FREE 64)  30 mL Oral  BID  . gentamicin cream  1 application Topical Daily  . insulin aspart  0-9 Units Subcutaneous TID WC  . lactulose  20 g Oral BID  . loratadine  10 mg Oral Daily  . metoprolol  50 mg Oral BID  . mupirocin cream   Topical BID  . potassium chloride  40 mEq Oral BID  . terazosin  5 mg Oral BH-q7a  . torsemide  40 mg Oral Daily  . traMADol  100 mg Oral QPM  . Warfarin - Pharmacist Dosing Inpatient   Does not apply q1800  . zolpidem  10 mg Oral QHS   Infusions:  . dialysis solution 2.5% low-MG/low-CA    . dialysis solution 2.5% low-MG/low-CA    . dialysis solution 4.25% low-MG/low-CA      Assessment: 76 yo male with afib is currently on therapeutic coumadin.  INR today is down a little to 2.3.  Goal of Therapy:  INR 2-3 Monitor platelets by  anticoagulation protocol: Yes   Plan:  - coumadin 1.5 mg po x1 - INR in am  Horris Speros, Tsz-Yin 04/22/2017,8:22 AM

## 2017-04-22 NOTE — Procedures (Signed)
I was present at this dialysis session. I have reviewed the session itself and made appropriate changes.   Has tolerated PD well, able to rest and any drain pain is tolerable.  Has numerous questions regarding discharge planning and transition to PD training.  He especially hopes to leave the hospital as soon as possible. I will need to coordinate with our home therapies group and see on what schedule his training could commence.   Cont PD, supine, using mix 2.5 and 4.25, cont to bring down weights.  Has 2-3+ LEE.    He also asks about AVF eval, which was scheduled 5/9 as outpatient with Donz[facility].    Filed Weights   04/21/17 0538 04/21/17 1950 04/22/17 0518  Weight: 99.5 kg (219 lb 5.7 oz) 98 kg (216 lb 0.8 oz) 98.1 kg (216 lb 4.3 oz)     Recent Labs Lab 04/22/17 0702  NA 137  K 4.2  CL 103  CO2 27  GLUCOSE 161*  BUN 45*  CREATININE 2.42*  CALCIUM 8.5*  PHOS 3.7     Recent Labs Lab 04/16/17 0245 04/18/17 0610 04/21/17 0519  WBC 5.9 4.6 3.6*  HGB 12.3* 13.1 12.8*  HCT 37.0* 39.8 38.6*  MCV 93.7 95.7 94.8  PLT 82* 87* 116*    Scheduled Meds: . acetaminophen  650 mg Oral q1800  . allopurinol  100 mg Oral Q breakfast  . feeding supplement (NEPRO CARB STEADY)  237 mL Oral BID BM  . feeding supplement (PRO-STAT SUGAR FREE 64)  30 mL Oral BID  . gentamicin cream  1 application Topical Daily  . insulin aspart  0-9 Units Subcutaneous TID WC  . lactulose  20 g Oral BID  . loratadine  10 mg Oral Daily  . metoprolol  50 mg Oral BID  . mupirocin cream   Topical BID  . potassium chloride  40 mEq Oral BID  . terazosin  5 mg Oral BH-q7a  . torsemide  40 mg Oral Daily  . traMADol  100 mg Oral QPM  . Warfarin - Pharmacist Dosing Inpatient   Does not apply q1800  . zolpidem  10 mg Oral QHS   Continuous Infusions: . dialysis solution 2.5% low-MG/low-CA    . dialysis solution 2.5% low-MG/low-CA    . dialysis solution 4.25% low-MG/low-CA     PRN Meds:.dianeal solution for  CAPD/CCPD with heparin, hydrocortisone cream, ondansetron **OR** ondansetron (ZOFRAN) IV, polyvinyl alcohol, sorbitol   Pearson Grippe  MD 04/22/2017, 8:16 AM

## 2017-04-22 NOTE — Progress Notes (Signed)
Patient refusing lactulose. Discussed with MD. Encouraged patient to take next dose. Reported to oncoming RN.

## 2017-04-23 ENCOUNTER — Inpatient Hospital Stay (HOSPITAL_COMMUNITY): Payer: Medicare Other | Admitting: Physical Therapy

## 2017-04-23 ENCOUNTER — Inpatient Hospital Stay (HOSPITAL_COMMUNITY): Payer: Medicare Other | Admitting: Occupational Therapy

## 2017-04-23 ENCOUNTER — Inpatient Hospital Stay (HOSPITAL_COMMUNITY): Payer: Medicare Other | Admitting: Speech Pathology

## 2017-04-23 DIAGNOSIS — Z992 Dependence on renal dialysis: Secondary | ICD-10-CM

## 2017-04-23 LAB — GLUCOSE, CAPILLARY
GLUCOSE-CAPILLARY: 123 mg/dL — AB (ref 65–99)
GLUCOSE-CAPILLARY: 180 mg/dL — AB (ref 65–99)
Glucose-Capillary: 140 mg/dL — ABNORMAL HIGH (ref 65–99)
Glucose-Capillary: 105 mg/dL — ABNORMAL HIGH (ref 65–99)

## 2017-04-23 LAB — PROTIME-INR
INR: 2.34
Prothrombin Time: 26 seconds — ABNORMAL HIGH (ref 11.4–15.2)

## 2017-04-23 MED ORDER — WARFARIN SODIUM 1 MG PO TABS
1.5000 mg | ORAL_TABLET | Freq: Once | ORAL | Status: AC
  Administered 2017-04-23: 18:00:00 1.5 mg via ORAL
  Filled 2017-04-23: qty 1

## 2017-04-23 NOTE — Progress Notes (Signed)
Pt has home unit and places self on/off.  RT will monitor. 

## 2017-04-23 NOTE — Progress Notes (Signed)
Speech Language Pathology Daily Session Note  Patient Details  Name: [name] TORTI, MD MRN: [id number] Date of Birth: 08-30-1941  Today's Date: 04/23/2017 SLP Individual Time: 1033-1130 SLP Individual Time Calculation (min): 57 min  Short Term Goals: Week 1: SLP Short Term Goal 1 (Week 1): Pt will recall complex, new information with supervision verbal cues for use of external aids.   SLP Short Term Goal 2 (Week 1): Pt will complete complex tasks with supervision verbal cues for functional problem solving.   SLP Short Term Goal 3 (Week 1): Pt will recognize and correct errors during functional tasks with supervision verbal cues.    Skilled Therapeutic Interventions:  Pt was seen for skilled ST targeting cognitive goals.  Pt changing his shirt upon arrival and requested assistance appropriately to stand up from wheelchair with supervision.  Pt began telling therapist that his discharge plan had been changed with new discharge date being "anytime between tomorrow evening and Saturday."  Pt also with very limited awareness of how his current level of function will impact his independence at home, stating that he plans on attending a medical lecture series in the next few days and walk his large dog even though he is scheduled for daily outpatient dialysis for the next 2-3 weeks and therapists are recommending supervision with household ambulation.  Wife was not present to verify report, so SLP discussed with CSW who reports that pt's wife is in agreement with discharge on Saturday.  Discussed realistic expectations for discharge with patient given his current medical complexity and level of function to improve awareness.  Pt was left in wheelchair at the end of today's therapy session with call bell within reach.  Continue per current plan of care.       Function:  Eating Eating                 Cognition Comprehension Comprehension assist level: Follows basic conversation/direction with  no assist  Expression   Expression assist level: Expresses basic needs/ideas: With no assist  Social Interaction Social Interaction assist level: Interacts appropriately 75 - 89% of the time - Needs redirection for appropriate language or to initiate interaction.  Problem Solving Problem solving assist level: Solves basic 75 - 89% of the time/requires cueing 10 - 24% of the time  Memory Memory assist level: Recognizes or recalls 75 - 89% of the time/requires cueing 10 - 24% of the time    Pain Pain Assessment Pain Assessment: No/denies pain Pain Score: 0-No pain  Therapy/Group: Individual Therapy  Alsie Younes, Selinda Orion 04/23/2017, 12:39 PM

## 2017-04-23 NOTE — Progress Notes (Signed)
Occupational Therapy Session Note  Patient Details  Name: [name] KRON, MD MRN: [id number] Date of Birth: 1941/08/31  Today's Date: 04/23/2017 OT Individual Time: 0900-1005 OT Individual Time Calculation (min): 65 min    Short Term Goals: Week 1:  OT Short Term Goal 1 (Week 1): Pt will maintain dynamic sitting EOB with supervison during performance of selfcare tasks.  OT Short Term Goal 2 (Week 1): Pt will complete sit to stand for LB bathing and dressing with min assist.  OT Short Term Goal 3 (Week 1): Pt will donn LB clothing over feet with supervision and use of AE. OT Short Term Goal 4 (Week 1): Pt will perform toilet transfer to the elevated toilet with min assist using the RW.  Skilled Therapeutic Interventions/Progress Updates:    Pt completed functional mobility to the ADL apartment with use of the single point cane and min guard assist.  Pt with slow speed and the need to take standing rest breaks but he was able to complete mobility.  Once in the apartment hand pt practice bed transfers, simulated walk-in shower transfers, and toilet transfers using his cane.  Supervision for completion of sit to supine and supine to sit on regular bed.  Min guard assist for stepping over the edge of the simulated shower with use of the cane and grab bars specific to his setup at home.  He was able to complete toilet transfer with min guard as well with use of the grab bar on the right.  Initial difficulty noted with sit to stand on first attempt but placed both hands on the grab bar and was able to complete sit to stand with min guard again.  Had pt ambulate back to his room with use of the single point cane while holding drink in the left hand.  Increased time needed to complete.  Pt also still needing mod instructional cueing for correct technique and control when transitioning from standing to sitting.  Pt left in room in wheelchair with call button and phone in reach.    Therapy  Documentation Precautions:  Precautions Precautions: Fall Precaution Comments:   Restrictions Weight Bearing Restrictions: No  Pain: Pain Assessment Pain Assessment: No/denies pain Pain Score: 0-No pain ADL: See Function Navigator for Current Functional Status.   Therapy/Group: Individual Therapy  Audri Kozub OTR/L   04/23/2017, 12:46 PM

## 2017-04-23 NOTE — Progress Notes (Signed)
ANTICOAGULATION CONSULT NOTE - Follow Up Consult  Pharmacy Consult for coumadin Indication: atrial fibrillation  Allergies  Allergen Reactions  . Actos [Pioglitazone Hydrochloride] Swelling and Other (See Comments)    Severe peripheral edema  . Codeine Other (See Comments)    Makes Pt aggressive  . Depakote [Divalproex Sodium] Diarrhea    severe  . Diltiazem Other (See Comments)    lethargy and dyspnea  . Hydralazine Other (See Comments)    Severe chills and SOB   . Isosorbide Dinitrate Other (See Comments)    Severe chills and SOB   . Januvia [Sitagliptin] Diarrhea and Other (See Comments)    bradycardia  . Loop Diuretics Other (See Comments)    Spike in BUN and creatine levels  . Losartan Other (See Comments)    aphasia  . Nsaids Other (See Comments)    Renal problems  . Onglyza [Saxagliptin] Diarrhea and Other (See Comments)    bradycardia  . Orudis [Ketoprofen] Other (See Comments)    Cannot take due to renal insufficiency  . Penicillins Swelling and Other (See Comments)    Serum sickness Has patient had a PCN reaction causing immediate rash, facial/tongue/throat swelling, SOB or lightheadedness with hypotension: Yes Has patient had a PCN reaction causing severe rash involving mucus membranes or skin necrosis: No Has patient had a PCN reaction that required hospitalization Yes Has patient had a PCN reaction occurring within the last 10 years: No If all of the above answers are "NO", then may proceed with Cephalosporin use.   [name] Potassium Iodide Itching and Rash    Severe entire body itching and rash  . Rozerem [Ramelteon] Diarrhea    Severe   . Amiodarone Other (See Comments)    diarrhea  . Latex Swelling  . Verapamil Other (See Comments)    Severe fatigue  . Benazepril Hcl Other (See Comments)    ? Possible lowers platelets  . Indomethacin Other (See Comments)    Renal problems   . Minocycline Other (See Comments)    Makes dizzy and felt just lousy.  [name]  Pentazocine Lactate Other (See Comments)    Unknown allergic reaction - pt and wife do not recall this  . Poison Ivy Extract [Poison Ivy Extract] Rash and Other (See Comments)    blisters  . Sertraline Hcl Other (See Comments)    Pt and wife do not recall this  . Shellfish Allergy Rash    Patient Measurements: Height: 5\' 11"  (180.3 cm) Weight: 206 lb 12.7 oz (93.8 kg) IBW/kg (Calculated) : 75.3 Heparin Dosing Weight:   Vital Signs: Temp: 97.6 F (36.4 C) (05/01 0724) Temp Source: Oral (05/01 0724) BP: 150/55 (05/01 0724) Pulse Rate: 67 (05/01 0724)  Labs:  Recent Labs  04/21/17 0519 04/22/17 0702 04/23/17 0436  HGB 12.8*  --   --   HCT 38.6*  --   --   PLT 116*  --   --   LABPROT 27.4* 25.7* 26.0*  INR 2.49 2.30 2.34  CREATININE 2.12* 2.42*  --     Estimated Creatinine Clearance: 29.9 mL/min (A) (by C-G formula based on SCr of 2.42 mg/dL (H)).   Medications:  Scheduled:  . acetaminophen  1,000 mg Oral QHS  . allopurinol  100 mg Oral Q breakfast  . feeding supplement (NEPRO CARB STEADY)  237 mL Oral BID BM  . feeding supplement (PRO-STAT SUGAR FREE 64)  30 mL Oral BID  . gentamicin cream  1 application Topical Daily  . insulin aspart  0-9 Units Subcutaneous TID WC  . lactulose  20 g Oral BID  . loratadine  10 mg Oral Daily  . metoprolol  50 mg Oral BID  . mupirocin cream   Topical BID  . potassium chloride  30 mEq Oral Daily  . terazosin  5 mg Oral BH-q7a  . torsemide  40 mg Oral Daily  . traMADol  100 mg Oral QPM  . Warfarin - Pharmacist Dosing Inpatient   Does not apply q1800  . zolpidem  10 mg Oral QHS   Infusions:  . dialysis solution 2.5% low-MG/low-CA    . dialysis solution 2.5% low-MG/low-CA    . dialysis solution 4.25% low-MG/low-CA      Assessment: 76 yo male with afib is currently on therapeutic coumadin.  INR today is 2.34.  Goal of Therapy:  INR 2-3 Monitor platelets by anticoagulation protocol: Yes   Plan:  - coumadin 1.5 mg po  x1 - INR in am  [name], Tsz-Yin 04/23/2017,8:13 AM

## 2017-04-23 NOTE — Progress Notes (Signed)
Physical Therapy Weekly Progress Note  Patient Details  Name: [name] PURVES, MD MRN: [id number] Date of Birth: Jan 20, 1941  Beginning of progress report period: April 17, 2017 End of progress report period: May 02, 2017  Today's Date: 04/23/2017 PT Individual Time: 2518-9842 PT Individual Time Calculation (min): 79 min   Patient has met 4 of 4 short term goals.    Patient continues to demonstrate the following deficits muscle weakness, decreased safety awareness and decreased standing balance and therefore will continue to benefit from skilled PT intervention to increase functional independence with mobility.  Patient progressing toward long term goals. Continue plan of care.  PT Short Term Goals Week 2:  PT Short Term Goal 1 (Week 2): STG=LTG  Skilled Therapeutic Interventions/Progress Updates:  Ambulation/gait training;Cognitive remediation/compensation;Community reintegration;Discharge planning;Disease management/prevention;DME/adaptive equipment instruction;Functional mobility training;Neuromuscular re-education;Pain management;Patient/family education;Psychosocial support;Stair training;Therapeutic Activities;Therapeutic Exercise;UE/LE Strength taining/ROM;UE/LE Coordination activities;Wheelchair propulsion/positioning    Pt sitting in w/c upon arrival, agreeable to PT session. Transfers: working on consistency with transfers including hand placement and controlled descent. Pt with tendency to lean into chair resulting on sliding. Working on use of UEs and sitting straight down. Repeated transfers performed from w/c, toilet, chair, mat table. Car transfers performed with spouse supervising. Reviewing technique and hand placement. Performed from low position and elevated to height of anticipated D/C vehicle. Ambulation: 150 ft with SPC, 3 standing rest breaks (X1 leaning against wall, X1 leaning on nurse station). 1 LOB with min guard to recover. Noted mild instability with fatigue.  Balance: working on static standing and progressing to toe taps with 4 inch step 2X 10 bilaterally and min assist for balance. Seated rest needed between attempts. Pt returned to room via w/c due to fatigue (total assist). Sitting in recliner with spouse present. Spouse agrees to notify nursing when she leaves to ensure safe placement of call light. Discussed need for w/c, pt/spouse decline at this time as it will be to heavy to utilize. Will continue to use transport chair as needed when home.   Therapy Documentation Precautions:  Precautions Precautions: Fall Precaution Comments:   Restrictions Weight Bearing Restrictions: No  Pain: Pain Assessment Pain Assessment: No/denies pain Pain Score: 0-No pain   See Function Navigator for Current Functional Status.  Therapy/Group: Individual Therapy  Linard Millers, PT 04/23/2017, 12:07 PM

## 2017-04-23 NOTE — Progress Notes (Signed)
Admit: 04/16/2017 LOS: 7  31M new ESRD, inpatient supine PD start, in CIR  Subjective:  No new events Have discussed with home therapies outpatient training: they could start PD training Thursday 5/3 or Monday 5/7 Pt remains adamant for discharge in near future, discussed with Dr. Posey Pronto PD did well overnight, Cont to have UF, weights coming down  04/30 0701 - 05/01 0700 In: 23762 [P.O.:600] Out: 12566 [Urine:600]  Filed Weights   04/22/17 1835 04/23/17 0508 04/23/17 0724  Weight: 96.3 kg (212 lb 4.8 oz) 97.3 kg (214 lb 8.1 oz) 93.8 kg (206 lb 12.7 oz)    Scheduled Meds: . acetaminophen  1,000 mg Oral QHS  . allopurinol  100 mg Oral Q breakfast  . feeding supplement (NEPRO CARB STEADY)  237 mL Oral BID BM  . feeding supplement (PRO-STAT SUGAR FREE 64)  30 mL Oral BID  . gentamicin cream  1 application Topical Daily  . insulin aspart  0-9 Units Subcutaneous TID WC  . lactulose  20 g Oral BID  . loratadine  10 mg Oral Daily  . metoprolol  50 mg Oral BID  . mupirocin cream   Topical BID  . potassium chloride  30 mEq Oral Daily  . terazosin  5 mg Oral BH-q7a  . torsemide  40 mg Oral Daily  . traMADol  100 mg Oral QPM  . warfarin  1.5 mg Oral ONCE-1800  . Warfarin - Pharmacist Dosing Inpatient   Does not apply q1800  . zolpidem  10 mg Oral QHS   Continuous Infusions: . dialysis solution 2.5% low-MG/low-CA    . dialysis solution 2.5% low-MG/low-CA    . dialysis solution 4.25% low-MG/low-CA     PRN Meds:.dianeal solution for CAPD/CCPD with heparin, hydrocortisone cream, ondansetron **OR** ondansetron (ZOFRAN) IV, polyvinyl alcohol, sorbitol  Current Labs: reviewed     Physical Exam:  Blood pressure (!) 150/55, pulse 67, temperature 97.6 F (36.4 C), temperature source Oral, resp. rate 18, height 5\' 11"  (1.803 m), weight 93.8 kg (206 lb 12.7 oz), SpO2 98 %. NAD RRR CTAB 2+ LEE  PD cath in LUQ, directed downwards No asterixis, AAO x3  A 1. ESRD, new start on PD,  supine 1. Cont CCPD 2L fill volume all supine 12min dwell, mix 2.5+4.25%; 5 exchanges, dry day 2. Will coordinate outpatient training at discharge 2. Anemia of CKD: not majore issue currently 3. R sided CHF + Hypervolemia, cont to target net negative with PD 4. CKD-MBD: stable 5. Insomnia, chronic ambien therapy 6. Deconditioning in CIR  P 1. Cont PD current regimen   Pearson Grippe MD 04/23/2017, 10:23 AM   Recent Labs Lab 04/19/17 8315 04/21/17 0519 04/22/17 0702  NA 135 138 137  K 4.1 4.1 4.2  CL 99* 104 103  CO2 28 25 27   GLUCOSE 166* 195* 161*  BUN 47* 43* 45*  CREATININE 2.03* 2.12* 2.42*  CALCIUM 8.7* 8.6* 8.5*  PHOS 3.1 3.6 3.7    Recent Labs Lab 04/18/17 0610 04/21/17 0519  WBC 4.6 3.6*  HGB 13.1 12.8*  HCT 39.8 38.6*  MCV 95.7 94.8  PLT 87* 116*

## 2017-04-23 NOTE — Progress Notes (Signed)
[facility]IC[facility] RE[facility]ABILITATION     PROGRESS NOTE  Subjective/Complaints:  Pt seen laying in bed this AM.  He slept well overnight.  He has several of the same questions that were discussed yesterday.  ROS: Denies CP, SOB, N/V/D.  Objective: Vital Signs: Blood pressure (!) 150/55, pulse 67, temperature 97.6 F (36.4 C), temperature source Oral, resp. rate 18, height 5\' 11"  (1.803 m), weight 93.8 kg (206 lb 12.7 oz), SpO2 98 %. No results found.  Recent Labs  04/21/17 0519  WBC 3.6*  HGB 12.8*  HCT 38.6*  PLT 116*    Recent Labs  04/21/17 0519 04/22/17 0702  NA 138 137  K 4.1 4.2  CL 104 103  GLUCOSE 195* 161*  BUN 43* 45*  CREATININE 2.12* 2.42*  CALCIUM 8.6* 8.5*   CBG (last 3)   Recent Labs  04/22/17 1650 04/22/17 2120 04/23/17 0628  GLUCAP 95 190* 140*    Wt Readings from Last 3 Encounters:  04/23/17 93.8 kg (206 lb 12.7 oz)  04/16/17 99.8 kg (220 lb 0.3 oz)  03/02/17 99.8 kg (220 lb)    Physical Exam:  BP (!) 150/55 (BP Location: Right Arm)   Pulse 67   Temp 97.6 F (36.4 C) (Oral)   Resp 18   Ht 5\' 11"  (1.803 m)   Wt 93.8 kg (206 lb 12.7 oz)   SpO2 98%   BMI 28.84 kg/m  Constitutional: He appears well-developed. NAD. HENT: Normocephalic and atraumatic.  Eyes: EOMI. No discharge.  Cardiovascular: IRRR. No JVD. +Murmur. Respiratory: Effort normal and breath sounds normal.  GI: Soft. Bowel sounds are normal.  PD catheter noted.  Neurological:  Follows simple commands.  STM deficits.  Motor: 4-/5 throughout (stable) Skin.warm and dry. Dry dressing to LUE.  Some scattered bruising around abdomen.  Psych: Normal mood and affect.  Assessment/Plan: 1. Functional deficits secondary to debility which require 3+ hours per day of interdisciplinary therapy in a comprehensive inpatient rehab setting. Physiatrist is providing close team supervision and 24 hour management of active medical problems listed below. Physiatrist and rehab  team continue to assess barriers to discharge/monitor patient progress toward functional and medical goals.  Function:  Bathing Bathing position   Position: Wheelchair/chair at sink  Bathing parts Body parts bathed by patient: Right arm, Left arm, Chest, Abdomen, Front perineal area, Buttocks Body parts bathed by helper: Right lower leg, Left lower leg, Buttocks, Back  Bathing assist Assist Level: 2 helpers      Upper Body Dressing/Undressing Upper body dressing   What is the patient wearing?: Pull over shirt/dress     Pull over shirt/dress - Perfomed by patient: Thread/unthread right sleeve, Thread/unthread left sleeve, Put head through opening, Pull shirt over trunk Pull over shirt/dress - Perfomed by helper: Pull shirt over trunk        Upper body assist Assist Level: Touching or steadying assistance(Pt > 75%) (standing)      Lower Body Dressing/Undressing Lower body dressing   What is the patient wearing?: Non-skid slipper socks, Pants   Underwear - Performed by helper: Thread/unthread right underwear leg, Thread/unthread left underwear leg, Pull underwear up/down Pants- Performed by patient: Thread/unthread right pants leg, Thread/unthread left pants leg, Pull pants up/down Pants- Performed by helper: Thread/unthread right pants leg, Thread/unthread left pants leg, Pull pants up/down Non-skid slipper socks- Performed by patient: Don/doff right sock, Don/doff left sock Non-skid slipper socks- Performed by helper: Don/doff right sock, Don/doff left sock Socks - Performed by patient:  Don/doff right sock   Shoes - Performed by patient: Don/doff right shoe, Don/doff left shoe            Lower body assist Assist for lower body dressing:  (sock aide)      Toileting Toileting   Toileting steps completed by patient: Adjust clothing prior to toileting, Performs perineal hygiene, Adjust clothing after toileting Toileting steps completed by helper: Adjust clothing after  toileting Toileting Assistive Devices: Grab bar or rail  Toileting assist Assist level: Touching or steadying assistance (Pt.75%)   Transfers Chair/bed transfer   Chair/bed transfer method: Ambulatory Chair/bed transfer assist level: Supervision or verbal cues Chair/bed transfer assistive device: Film/video editor lift: Ecologist     Max distance: 130 ft Assist level: Supervision or verbal cues   Wheelchair   Type: Manual Max wheelchair distance: 50 ft Assist Level: Supervision or verbal cues  Cognition Comprehension Comprehension assist level: Follows complex conversation/direction with no assist  Expression Expression assist level: Expresses complex ideas: With no assist  Social Interaction Social Interaction assist level: Interacts appropriately with others - No medications needed.  Problem Solving Problem solving assist level: Solves complex problems: Recognizes & self-corrects  Memory Memory assist level: Complete Independence: No helper    Medical Problem List and Plan:  1. Weakness with cognitive deficits secondary to hepatic encephalopathy and multiple medical issues   Cont CIR  2. DVT Prophylaxis/Anticoagulation: Chronic Coumadin. Monitor for rebleeding episodes   INR therapeutic 5/1 3. Pain Management: Valium 10 mg daily as needed, Ultram 100 mg every 6 hours as needed  4. Mood: Provide emotional support  5. Neuropsych: This patient is not fully capable of making decisions on his own behalf.  6. Skin/Wound Care: Cleanse wounds to left hand Apply Bactroban Vaseline gauze for atraumatic dressing removed. Wrap with Kerlix changed daily  7. Fluids/Electrolytes/Nutrition: Routine I&Os 8. ESRD/CPPD. Status post peritoneal dialysis catheter 04/05/2017. Follow-up renal services  9. 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 Monitor for any signs of fluid  Filed Weights   04/22/17 1835 04/23/17 0508 04/23/17 0724  Weight: 96.3 kg (212 lb 4.8  oz) 97.3 kg (214 lb 8.1 oz) 93.8 kg (206 lb 12.7 oz)   10. 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 Status post paracentesis 03/06/2017. Continue lactulose.   Ammonia 39 on 4/30  Patient refusing Chronulac. 11. Atrial fibrillation. Continue Coumadin. Cardiac rate control  12. Hypertension. Lopressor 50 mg twice a day, Hytrin 5 mg daily, Demadex 40 mg daily   Monitor with increased mobility  Slightly labile, relatively controlled 5/1 13. Anemia of chronic disease.   Hb 12.8 on 4/29  Cont to monitor 14. History of gout. Allopurinol 100 mg daily. Monitor for any gout flareups  15. DM type 2  Monitor             Amaryl d/ced, pt refusing stating bad reaction in past  Labile 5/1 16. Thrombocytopenia   Plts 116 on 4/29  Cont to monitor 17. Hypoalbuminemia  Supplement initiated 4/26 18. Obesity  Body mass index is 28.84 kg/m.  Diet and exercise education  Encourage weight loss to increase endurance and promote overall health  >30 minutes spent with patient and coordination of care regarding medications, discharge plans, PD  LOS (Days) 7 A FACE TO FACE EVALUATION WAS PERFORMED  Ankit Lorie Phenix 04/23/2017 8:29 AM

## 2017-04-23 NOTE — Progress Notes (Signed)
Occupational Therapy Weekly Progress Note  Patient Details  Name: [name] HABIB, MD MRN: [id number] Date of Birth: 30-May-1941  Beginning of progress report period: April 17, 2017 End of progress report period: Apr 23, 2017  Today's Date: 04/23/2017 OT Individual Time: 3295-1884 OT Individual Time Calculation (min): 32 min    Patient has met 4 of 4 short term goals.  Pt continues to make steady progress with OT.  Currently at a min guard assist level for selfcare tasks at this time and for functional transfers to simulated walk-in shower and for toilet transfers to and elevated toilet with rail and arm rests.  He continues to demonstrate some higher level cognitive tasks including anticipatory awareness related to use of DME.  Mod instructional cueing is needed for correct hand placement and technique for stand to sit as he demonstrates decreased forward trunk flexion for controlled descent.  Still with some difficulty reaching his feet but wears slip on shoes which he can manage and has been educated on use of AE for donning socks.  Endurance continues to be limited but O2 sats remain greater than 96%.  Pt needs rest breaks at times secondary to fatigue but it is not limiting his participation at this time.  Feel he is on target for established goals.  Recommend continued OT with focus on ADL independence, balance, and safety.  Will continue with family education with wife as well.    Patient continues to demonstrate the following deficits: muscle weakness, decreased cardiorespiratoy endurance, decreased awareness and decreased memory and decreased standing balance and decreased balance strategies and therefore will continue to benefit from skilled OT intervention to enhance overall performance with BADL and Reduce care partner burden.  Patient progressing toward long term goals..  Continue plan of care.  OT Short Term Goals Week 2:  OT Short Term Goal 1 (Week 2): Continue working on  established supervision to modified independent goals.  Skilled Therapeutic Interventions/Progress Updates:    Pt worked on toilet transfers with use of the cane to 3:1 over the toilet with min guard assist.  Worked on sit to stand transitions and functional transfers with emphasis on left hand placement for stand to sit and for controlled descent.  Pt still demonstrates decreased forward trunk flexion for controlled sitting and for being able to reach the surface.  LOB posteriorly noted with this.  Pt's wife present discussed his performance with transfers during earlier session as well as DME needs.  She reports having a 3:1 already in the shower and for use over the toilet.  She attempted lifting a standard wheelchair in order to determine if she would be able to put it in the car and take it out.  She reports this is likely too difficult for her to manage at this time.  They have a transport chair but it is older.  Will continue with problem solving for home.    Therapy Documentation Precautions:  Precautions Precautions: Fall Precaution Comments:   Restrictions Weight Bearing Restrictions: No  Pain: Pain Assessment Pain Assessment: No/denies pain Pain Score: 0-No pain ADL: See Function Navigator for Current Functional Status.   Therapy/Group: Individual Therapy  Kenai Fluegel OTR/L 04/23/2017, 2:06 PM

## 2017-04-24 ENCOUNTER — Inpatient Hospital Stay (HOSPITAL_COMMUNITY): Payer: Medicare Other | Admitting: Speech Pathology

## 2017-04-24 ENCOUNTER — Inpatient Hospital Stay (HOSPITAL_COMMUNITY): Payer: Medicare Other | Admitting: Occupational Therapy

## 2017-04-24 ENCOUNTER — Inpatient Hospital Stay (HOSPITAL_COMMUNITY): Payer: Medicare Other | Admitting: Physical Therapy

## 2017-04-24 LAB — GLUCOSE, CAPILLARY
GLUCOSE-CAPILLARY: 173 mg/dL — AB (ref 65–99)
GLUCOSE-CAPILLARY: 111 mg/dL — AB (ref 65–99)
Glucose-Capillary: 107 mg/dL — ABNORMAL HIGH (ref 65–99)
Glucose-Capillary: 127 mg/dL — ABNORMAL HIGH (ref 65–99)

## 2017-04-24 LAB — PROTIME-INR
INR: 2.53
Prothrombin Time: 27.7 seconds — ABNORMAL HIGH (ref 11.4–15.2)

## 2017-04-24 MED ORDER — HEPARIN 1000 UNIT/ML FOR PERITONEAL DIALYSIS
2500.0000 [IU] | INTRAMUSCULAR | Status: DC | PRN
  Filled 2017-04-24: qty 2.5

## 2017-04-24 MED ORDER — GENTAMICIN SULFATE 0.1 % EX CREA
1.0000 "application " | TOPICAL_CREAM | Freq: Every day | CUTANEOUS | Status: DC
  Administered 2017-04-24 – 2017-04-26 (×3): 1 via TOPICAL
  Filled 2017-04-24: qty 15

## 2017-04-24 MED ORDER — WARFARIN SODIUM 1 MG PO TABS
1.0000 mg | ORAL_TABLET | Freq: Once | ORAL | Status: AC
  Administered 2017-04-24: 1 mg via ORAL
  Filled 2017-04-24: qty 1

## 2017-04-24 MED ORDER — DELFLEX-LC/2.5% DEXTROSE 394 MOSM/L IP SOLN
INTRAPERITONEAL | Status: DC
  Administered 2017-04-25: 20:00:00 via INTRAPERITONEAL

## 2017-04-24 MED ORDER — BOOST / RESOURCE BREEZE PO LIQD: 1.0000 | Freq: Two times a day (BID) | ORAL | Status: DC

## 2017-04-24 MED ORDER — DELFLEX-LC/4.25% DEXTROSE 483 MOSM/L IP SOLN: INTRAPERITONEAL | Status: DC

## 2017-04-24 NOTE — Progress Notes (Signed)
[facility] PH[facility] MEDICINE & REHABILITATION     PROGRESS NOTE  Subjective/Complaints:  Pt seen laying in bed this AM.  He slept well overnight.  He is looking forward to being discharged on Saturday.   ROS: Denies CP, SOB, N/V/D.  Objective: Vital Signs: Blood pressure 135/69, pulse (!) 57, temperature 97.5 F (36.4 C), temperature source Oral, resp. rate 18, height 5\' 11"  (1.803 m), weight 94.4 kg (208 lb 1.8 oz), SpO2 98 %. No results found. No results for input(s): WBC, HGB, HCT, PLT in the last 72 hours.  Recent Labs  04/22/17 0702  NA 137  K 4.2  CL 103  GLUCOSE 161*  BUN 45*  CREATININE 2.42*  CALCIUM 8.5*   CBG (last 3)   Recent Labs  04/23/17 1657 04/23/17 2042 04/24/17 0635  GLUCAP 123* 180* 173*    Wt Readings from Last 3 Encounters:  04/24/17 94.4 kg (208 lb 1.8 oz)  04/16/17 99.8 kg (220 lb 0.3 oz)  03/02/17 99.8 kg (220 lb)    Physical Exam:  BP 135/69 (BP Location: Right Arm)   Pulse (!) 57   Temp 97.5 F (36.4 C) (Oral)   Resp 18   Ht 5\' 11"  (1.803 m)   Wt 94.4 kg (208 lb 1.8 oz)   SpO2 98%   BMI 29.03 kg/m  Constitutional: He appears well-developed. NAD. HENT: Normocephalic and atraumatic.  Eyes: EOMI. No discharge.  Cardiovascular: IRR. No JVD. +Murmur. Respiratory: Effort normal and breath sounds normal.  GI: Soft. Bowel sounds are normal.  PD catheter noted.  Neurological:  Follows simple commands.  STM deficits.  Motor: 4+/5 throughout  Skin.warm and dry. Dry dressing to LUE.  Some scattered bruising around abdomen.  Psych: Normal mood and affect.  Assessment/Plan: 1. Functional deficits secondary to debility which require 3+ hours per day of interdisciplinary therapy in a comprehensive inpatient rehab setting. Physiatrist is providing close team supervision and 24 hour management of active medical problems listed below. Physiatrist and rehab team continue to assess barriers to discharge/monitor patient progress toward  functional and medical goals.  Function:  Bathing Bathing position   Position: Wheelchair/chair at sink  Bathing parts Body parts bathed by patient: Right arm, Left arm, Chest, Abdomen, Front perineal area, Buttocks Body parts bathed by helper: Right lower leg, Left lower leg, Buttocks, Back  Bathing assist Assist Level: 2 helpers      Upper Body Dressing/Undressing Upper body dressing   What is the patient wearing?: Pull over shirt/dress     Pull over shirt/dress - Perfomed by patient: Thread/unthread right sleeve, Thread/unthread left sleeve, Put head through opening, Pull shirt over trunk Pull over shirt/dress - Perfomed by helper: Pull shirt over trunk        Upper body assist Assist Level: Touching or steadying assistance(Pt > 75%) (standing)      Lower Body Dressing/Undressing Lower body dressing   What is the patient wearing?: Non-skid slipper socks, Pants   Underwear - Performed by helper: Thread/unthread right underwear leg, Thread/unthread left underwear leg, Pull underwear up/down Pants- Performed by patient: Thread/unthread right pants leg, Thread/unthread left pants leg, Pull pants up/down Pants- Performed by helper: Thread/unthread right pants leg, Thread/unthread left pants leg, Pull pants up/down Non-skid slipper socks- Performed by patient: Don/doff right sock, Don/doff left sock Non-skid slipper socks- Performed by helper: Don/doff right sock, Don/doff left sock Socks - Performed by patient: Don/doff right sock   Shoes - Performed by patient: Don/doff right shoe, Don/doff left shoe  Lower body assist Assist for lower body dressing:  (sock aide)      Toileting Toileting   Toileting steps completed by patient: Adjust clothing prior to toileting, Performs perineal hygiene, Adjust clothing after toileting Toileting steps completed by helper: Adjust clothing after toileting Toileting Assistive Devices: Grab bar or rail  Toileting assist Assist  level: Supervision or verbal cues   Transfers Chair/bed transfer Chair/bed transfer activity did not occur: Safety/medical concerns (dialysis) Chair/bed transfer method: Ambulatory Chair/bed transfer assist level: Supervision or verbal cues Chair/bed transfer assistive device: Film/video editor lift: Ecologist     Max distance: 150 ft Assist level: Supervision or verbal cues   Wheelchair   Type: Manual Max wheelchair distance: 50 ft Assist Level: Supervision or verbal cues  Cognition Comprehension Comprehension assist level: Follows basic conversation/direction with no assist  Expression Expression assist level: Expresses basic needs/ideas: With no assist  Social Interaction Social Interaction assist level: Interacts appropriately 75 - 89% of the time - Needs redirection for appropriate language or to initiate interaction.  Problem Solving Problem solving assist level: Solves basic 75 - 89% of the time/requires cueing 10 - 24% of the time  Memory Memory assist level: Recognizes or recalls 75 - 89% of the time/requires cueing 10 - 24% of the time    Medical Problem List and Plan:  1. Weakness with cognitive deficits secondary to hepatic encephalopathy and multiple medical issues   Cont CIR  2. DVT Prophylaxis/Anticoagulation: Chronic Coumadin. Monitor for rebleeding episodes   INR therapeutic 5/2 3. Pain Management: Valium 10 mg daily as needed, Ultram 100 mg every 6 hours as needed  4. Mood: Provide emotional support  5. Neuropsych: This patient is not fully capable of making decisions on his own behalf.  6. Skin/Wound Care: Cleanse wounds to left hand Apply Bactroban Vaseline gauze for atraumatic dressing removed. Wrap with Kerlix changed daily  7. Fluids/Electrolytes/Nutrition: Routine I&Os 8. ESRD/CPPD. Status post peritoneal dialysis catheter 04/05/2017. Follow-up renal services  9. 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 Monitor for any  signs of fluid  Filed Weights   04/23/17 0724 04/23/17 1905 04/24/17 0557  Weight: 93.8 kg (206 lb 12.7 oz) 92.4 kg (203 lb 11.3 oz) 94.4 kg (208 lb 1.8 oz)   10. 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 Status post paracentesis 03/06/2017. Continue lactulose.   Ammonia 39 on 4/30  Patient refusing Chronulac. 11. Atrial fibrillation. Continue Coumadin. Cardiac rate control  12. Hypertension. Lopressor 50 mg twice a day, Hytrin 5 mg daily, Demadex 40 mg daily   Monitor with increased mobility  Relatively controlled 5/2 13. Anemia of chronic disease.   Hb 12.8 on 4/29  Cont to monitor 14. History of gout. Allopurinol 100 mg daily. Monitor for any gout flareups  15. DM type 2  Monitor             Amaryl d/ced, pt refusing stating bad reaction in past  Elevated 5/2, have discussed with patient, does not believe intervention is necessary/appropriate 16. Thrombocytopenia   Plts 116 on 4/29  Cont to monitor 17. Hypoalbuminemia  Supplement initiated 4/26 18. Obesity  Body mass index is 29.03 kg/m.  Diet and exercise education  Encourage weight loss to increase endurance and promote overall health  LOS (Days) 8 A FACE TO FACE EVALUATION WAS PERFORMED  Nykeem Citro Lorie Phenix 04/24/2017 8:39 AM

## 2017-04-24 NOTE — Progress Notes (Addendum)
Nutrition Follow-up  DOCUMENTATION CODES:   Non-severe (moderate) malnutrition in context of chronic illness  INTERVENTION:  Discontinue Prostat and Nepro Shake due to poor acceptance.   Provide Boost Breeze po BID, each supplement provides 250 kcal and 9 grams of protein.  Encourage adequate PO intake.   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 Presented 04/08/2017 with altered mental status, scrotal edema and abdominal discomfort. Hemodialysis/CCPD ongoing.  Pt reports having a good appetite. Pt reports eating well PTA with usual consumption of at least 3 meals a day. Meal completion has been mostly 75-100%. Pt reports his wife brings in most of his meals and snacks as he does not like most of the food on his hospital meals. Pt currently has Nepro Shake and Prostat ordered and has been refusing them. Pt reports Nepro Shake causes n/v. RD to modify orders and order Boost Breeze instead. Pt is knowledgeable on the renal diet regarding his peritoneal dialysis. Emphasized the importance of adequate protein intake at meals. Pt expressed understanding.   Nutrition-Focused physical exam completed. Findings are moderate fat  depletion, moderate muscle depletion, and moderate edema.   Labs and medications reviewed.   Diet Order:  Diet Carb Modified Fluid consistency: Thin; Room service appropriate? Yes  Skin:   (Incision on abdomen)  Last BM:  5/1  Height:   Ht Readings from Last 1 Encounters:  04/16/17 5' 11" (1.803 m)    Weight:   Wt Readings from Last 1 Encounters:  04/24/17 203 lb 11.3 oz (92.4 kg)    Ideal Body Weight:  78 kg  BMI:  Body mass index is 28.41 kg/m.  Estimated Nutritional Needs:   Kcal:  2150-2350  Protein:  110-130 grams  Fluid:  Per MD  EDUCATION NEEDS:   No education needs identified at this time  Stephanie Craig, MS, RD, LDN Pager # [phone] After hours/ weekend pager # [phone]  

## 2017-04-24 NOTE — Progress Notes (Signed)
Admit: 04/16/2017 LOS: 8  51M new ESRD, inpatient supine PD start, in CIR  Subjective:  No new events Met with wife and patient Having end of drain pain, plan to bypass first drain since has dry belly Has questions about abdominal stiches and removal date Plan for DC Sat AM, I confirmed to start PD at [facility] Monday AM  05/01 0701 - 05/02 0700 In: 84696 [P.O.:600] Out: 29528 [Urine:200]  Filed Weights   04/23/17 0724 04/23/17 1905 04/24/17 0557  Weight: 93.8 kg (206 lb 12.7 oz) 92.4 kg (203 lb 11.3 oz) 94.4 kg (208 lb 1.8 oz)    Scheduled Meds: . acetaminophen  1,000 mg Oral QHS  . allopurinol  100 mg Oral Q breakfast  . feeding supplement (NEPRO CARB STEADY)  237 mL Oral BID BM  . feeding supplement (PRO-STAT SUGAR FREE 64)  30 mL Oral BID  . gentamicin cream  1 application Topical Daily  . insulin aspart  0-9 Units Subcutaneous TID WC  . lactulose  20 g Oral BID  . loratadine  10 mg Oral Daily  . metoprolol  50 mg Oral BID  . mupirocin cream   Topical BID  . potassium chloride  30 mEq Oral Daily  . terazosin  5 mg Oral BH-q7a  . torsemide  40 mg Oral Daily  . traMADol  100 mg Oral QPM  . Warfarin - Pharmacist Dosing Inpatient   Does not apply q1800  . zolpidem  10 mg Oral QHS   Continuous Infusions: . dialysis solution 2.5% low-MG/low-CA    . dialysis solution 2.5% low-MG/low-CA    . dialysis solution 4.25% low-MG/low-CA     PRN Meds:.dianeal solution for CAPD/CCPD with heparin, hydrocortisone cream, ondansetron **OR** ondansetron (ZOFRAN) IV, polyvinyl alcohol, sorbitol  Current Labs: reviewed     Physical Exam:  Blood pressure 135/69, pulse (!) 57, temperature 97.5 F (36.4 C), temperature source Oral, resp. rate 18, height _0  (1.803 m), weight 94.4 kg (208 lb 1.8 oz), SpO2 98 %. NAD RRR CTAB 2+ LEE  PD cath in LUQ, directed downwards No asterixis, AAO x3  A 1. ESRD, new start on PD, supine 1. Cont CCPD 2L fill volume all supine 33mn dwell, mix  2.5+4.25%; 5 exchanges, dry day 2. To start PD as outpt 5/7 2. Anemia of CKD: not majore issue currently 3. R sided CHF + Hypervolemia, cont to target net negative with PD using higher dextrose 4. CKD-MBD: stable 5. Insomnia, chronic ambien therapy 6. Deconditioning in CIR  P 1. Cont PD current regimen 2. Update outpatient team 3. Skip first drain prior to first fill, discussed with RN   RPearson GrippeMD 04/24/2017, 8:42 AM   Recent Labs Lab 04/19/17 0413204/29/18 0519 04/22/17 0702  NA 135 138 137  K 4.1 4.1 4.2  CL 99* 104 103  CO2 _1 GLUCOSE 166* 195* 161*  BUN 47* 43* 45*  CREATININE 2.03* 2.12* 2.42*  CALCIUM 8.7* 8.6* 8.5*  PHOS 3.1 3.6 3.7    Recent Labs Lab 04/18/17 0610 04/21/17 0519  WBC 4.6 3.6*  HGB 13.1 12.8*  HCT 39.8 38.6*  MCV 95.7 94.8  PLT 87* 116*

## 2017-04-24 NOTE — Progress Notes (Signed)
Speech Language Pathology Weekly Progress and Session Note  Patient Details  Name: [name] GROENE, MD MRN: [id number] Date of Birth: 10/29/1941  Beginning of progress report period:  April 17, 2017  End of progress report period: Apr 24, 2017   Today's Date: 04/24/2017 SLP Individual Time: 0905-1000 SLP Individual Time Calculation (min): 55 min  Short Term Goals: Week 1: SLP Short Term Goal 1 (Week 1): Pt will recall complex, new information with supervision verbal cues for use of external aids.   SLP Short Term Goal 1 - Progress (Week 1): Progressing toward goal SLP Short Term Goal 2 (Week 1): Pt will complete complex tasks with supervision verbal cues for functional problem solving.   SLP Short Term Goal 2 - Progress (Week 1): Met SLP Short Term Goal 3 (Week 1): Pt will recognize and correct errors during functional tasks with supervision verbal cues.   SLP Short Term Goal 3 - Progress (Week 1): Progressing toward goal    New Short Term Goals: Week 2: SLP Short Term Goal 1 (Week 2): Pt will recall complex, new information with supervision verbal cues for use of external aids.   SLP Short Term Goal 2 (Week 2): Pt will complete complex tasks with mod I functional problem solving.   SLP Short Term Goal 3 (Week 2): Pt will recognize and correct errors during functional tasks with supervision verbal cues.    Weekly Progress Updates:  Pt has made functional gains this reporting period and has met 1 out of 3 short term goals.  Pt is overall min assist-supervision level for complex tasks due to decreased recall of new information and decreased awareness of his current limitations.  Pt would continue to benefit from skilled ST while inpatient in order to maximize functional gains and reduce burden of care prior to discharge.  Pt and family education is ongoing.  Do not anticipate that pt will need ST follow up at next level of care given that he appears to be near his baseline per pt and family  reports.       Intensity: Minumum of 1-2 x/day, 30 to 90 minutes Frequency: 3 to 5 out of 7 days Duration/Length of Stay: 14-18 days  Treatment/Interventions: Cognitive remediation/compensation;Cueing hierarchy;Internal/external aids;Patient/family education   Daily Session  Skilled Therapeutic Interventions: Pt was seen for skilled ST targeting cognitive goals.  Upon arrival, pt was sitting naked in his wheelchair attempting to get dressed by himself.  He reported that he was waiting to call someone for help but didn't appear to have done so at that time.  Therapist provided pt with distant supervision cues for safety awareness when standing up from wheelchair and ambulating around room during various self care tasks.  Pt's wife was present during today's therapy session and reported concerns over "stuttering" behaviors in pt's speech over the last 1-2 days.  No stuttering behaviors noted during today's therapy session although pt does appear to have a hesitancy of thought and was noted to lose his train of thought intermittently throughout therapy sessions.  SLP facilitated the session with a complex, familiar problem solving/reasoning task.  Pt was able to complete task with mod I.  Pt was left in wheelchair at the end of today's therapy session with wife at bedside.  Goals updated on this date to reflect current progress and plan of care.        Function:   Eating Eating   Modified Consistency Diet: No Eating Assist Level: No help, No cues  Cognition Comprehension Comprehension assist level: Follows basic conversation/direction with no assist  Expression   Expression assist level: Expresses basic needs/ideas: With no assist  Social Interaction Social Interaction assist level: Interacts appropriately 75 - 89% of the time - Needs redirection for appropriate language or to initiate interaction.  Problem Solving Problem solving assist level: Solves basic 90% of the  time/requires cueing < 10% of the time  Memory Memory assist level: Recognizes or recalls 75 - 89% of the time/requires cueing 10 - 24% of the time   General    Pain Pain Assessment Pain Assessment: No/denies pain  Therapy/Group: Individual Therapy  Kaylum Shrum, Selinda Orion 04/24/2017, 12:44 PM

## 2017-04-24 NOTE — Progress Notes (Signed)
Physical Therapy Session Note  Patient Details  Name: ROLLAN ROGER, MD MRN: [id number] Date of Birth: [birth year]  Today's Date: 04/24/2017 PT Individual Time: 1435-1536 PT Individual Time Calculation (min): 61 min   Short Term Goals: Week 2:  PT Short Term Goal 1 (Week 2): STG=LTG  Skilled Therapeutic Interventions/Progress Updates:  Pt received in room reporting need to urinate. Pt ambulated within room to bathroom without AD but holding to various objects for balance (close supervision overall from therapist). Pt utilized urinal in standing position, and performed hand hygiene at sink, with supervision without LOB. Pt then ambulated room<>gym with SPC and supervision'; pt begins to hold to rail in hallway as he fatigues. Pt engaged in pet therapy and tolerated standing ~30 minutes with SPC and supervision; activity focused on standing tolerance/endurance. Pt utilized nu-step on level 4 x 6 minutes with all four extremities with task focusing on endurance training. At end of session pt left sitting in w/c in room with all needs within reach.  Therapy Documentation Precautions:  Precautions Precautions: Fall Precaution Comments:   Restrictions Weight Bearing Restrictions: No  Pain: No c/o pain reported.   See Function Navigator for Current Functional Status.   Therapy/Group: Individual Therapy  Waunita Schooner 04/24/2017, 4:16 PM

## 2017-04-24 NOTE — Progress Notes (Signed)
Occupational Therapy Session Note  Patient Details  Name: [name] KAMPA, MD MRN: [id number] Date of Birth: 07/08/1941  Today's Date: 04/24/2017 OT Individual Time: 1034-1200 OT Individual Time Calculation (min): 86 min    Short Term Goals: Week 2:  OT Short Term Goal 1 (Week 2): Continue working on established supervision to modified independent goals.  Skilled Therapeutic Interventions/Progress Updates:    Pt ambulated to the ADL apartment with min guard assist using the single point cane during session.  He needed one standing rest break to complete this.  Noted pt continues to stay close to the right side of the hallway which impacts his ability to swing the left arm during gait.  He reports that he stays close in case he loses his balance.  Wife accompanied [place] to the apartment where we again setup simulated shower edge of 8 1/2 inches to work on stepping over.  Practiced with cane and simulated grab bar with min assist to step into the shower.  He was unable to step out using just the cane, as grab bar is not available outside of the shower, only on the inside.  Utilized RW for transfer in and out on second attempt and he could complete with supervision.  Also practiced toilet transfer to elevated toilet with min assist as well.  Pt with decreased control during descent, so recommend use of 3:1 over the toilet.  Ambulated back to the room to finish session with 2-3 standing rest breaks.  Pt left in wheelchair at end of session with spouse present and call button in reach.       Therapy Documentation Precautions:  Precautions Precautions: Fall Precaution Comments:   Restrictions Weight Bearing Restrictions: No  Pain: Pain Assessment Pain Assessment: No/denies pain ADL: See Function Navigator for Current Functional Status.   Therapy/Group: Individual Therapy  Mauricio Dahlen OTR/L 04/24/2017, 12:58 PM

## 2017-04-24 NOTE — Progress Notes (Signed)
ANTICOAGULATION CONSULT NOTE - Follow Up Consult  Pharmacy Consult for coumadin Indication: atrial fibrillation  Patient Measurements: Height: 5\' 11"  (180.3 cm) Weight: 203 lb 11.3 oz (92.4 kg) IBW/kg (Calculated) : 75.3 Heparin Dosing Weight:   Vital Signs: Temp: 98.1 F (36.7 C) (05/02 1331) Temp Source: Oral (05/02 1331) BP: 129/59 (05/02 1331) Pulse Rate: 50 (05/02 1331)  Labs:  Recent Labs  04/22/17 0702 04/23/17 0436 04/24/17 0720  LABPROT 25.7* 26.0* 27.7*  INR 2.30 2.34 2.53  CREATININE 2.42*  --   --     Estimated Creatinine Clearance: 29.7 mL/min (A) (by C-G formula based on SCr of 2.42 mg/dL (H)).   Assessment: 76 yo male with afib is currently on therapeutic coumadin.  INR today is 2.53.  Goal of Therapy:  INR 2-3 Monitor platelets by anticoagulation protocol: Yes   Plan:  - coumadin 1 mg po x1 - INR in am  Maryanna Shape, PharmD, BCPS  Clinical Pharmacist  Pager: [phone]   04/24/2017,1:53 PM

## 2017-04-25 ENCOUNTER — Inpatient Hospital Stay (HOSPITAL_COMMUNITY): Payer: Medicare Other | Admitting: Speech Pathology

## 2017-04-25 ENCOUNTER — Inpatient Hospital Stay (HOSPITAL_COMMUNITY): Payer: Medicare Other

## 2017-04-25 ENCOUNTER — Inpatient Hospital Stay (HOSPITAL_COMMUNITY): Payer: Medicare Other | Admitting: Occupational Therapy

## 2017-04-25 LAB — PROTIME-INR
INR: 2.84
Prothrombin Time: 30.4 seconds — ABNORMAL HIGH (ref 11.4–15.2)

## 2017-04-25 LAB — GLUCOSE, CAPILLARY
GLUCOSE-CAPILLARY: 184 mg/dL — AB (ref 65–99)
GLUCOSE-CAPILLARY: 192 mg/dL — AB (ref 65–99)
GLUCOSE-CAPILLARY: 83 mg/dL (ref 65–99)
Glucose-Capillary: 118 mg/dL — ABNORMAL HIGH (ref 65–99)

## 2017-04-25 MED ORDER — WARFARIN SODIUM 1 MG PO TABS
1.0000 mg | ORAL_TABLET | Freq: Once | ORAL | Status: AC
  Administered 2017-04-25: 1 mg via ORAL
  Filled 2017-04-25: qty 1

## 2017-04-25 NOTE — Progress Notes (Signed)
ANTICOAGULATION CONSULT NOTE - Follow Up Consult  Pharmacy Consult for coumadin Indication: atrial fibrillation  Allergies  Allergen Reactions  . Actos [Pioglitazone Hydrochloride] Swelling and Other (See Comments)    Severe peripheral edema  . Codeine Other (See Comments)    Makes Pt aggressive  . Depakote [Divalproex Sodium] Diarrhea    severe  . Diltiazem Other (See Comments)    lethargy and dyspnea  . Hydralazine Other (See Comments)    Severe chills and SOB   . Isosorbide Dinitrate Other (See Comments)    Severe chills and SOB   . Januvia [Sitagliptin] Diarrhea and Other (See Comments)    bradycardia  . Loop Diuretics Other (See Comments)    Spike in BUN and creatine levels  . Losartan Other (See Comments)    aphasia  . Nsaids Other (See Comments)    Renal problems  . Onglyza [Saxagliptin] Diarrhea and Other (See Comments)    bradycardia  . Orudis [Ketoprofen] Other (See Comments)    Cannot take due to renal insufficiency  . Penicillins Swelling and Other (See Comments)    Serum sickness Has patient had a PCN reaction causing immediate rash, facial/tongue/throat swelling, SOB or lightheadedness with hypotension: Yes Has patient had a PCN reaction causing severe rash involving mucus membranes or skin necrosis: No Has patient had a PCN reaction that required hospitalization Yes Has patient had a PCN reaction occurring within the last 10 years: No If all of the above answers are "NO", then may proceed with Cephalosporin use.   [name] Potassium Iodide Itching and Rash    Severe entire body itching and rash  . Rozerem [Ramelteon] Diarrhea    Severe   . Amiodarone Other (See Comments)    diarrhea  . Latex Swelling  . Verapamil Other (See Comments)    Severe fatigue  . Benazepril Hcl Other (See Comments)    ? Possible lowers platelets  . Indomethacin Other (See Comments)    Renal problems   . Minocycline Other (See Comments)    Makes dizzy and felt just lousy.  [name]  Pentazocine Lactate Other (See Comments)    Unknown allergic reaction - pt and wife do not recall this  . Poison Ivy Extract [Poison Ivy Extract] Rash and Other (See Comments)    blisters  . Sertraline Hcl Other (See Comments)    Pt and wife do not recall this  . Shellfish Allergy Rash    Patient Measurements: Height: 5\' 11"  (180.3 cm) Weight: 203 lb 11.3 oz (92.4 kg) IBW/kg (Calculated) : 75.3 Heparin Dosing Weight:   Vital Signs: Temp: 97.9 F (36.6 C) (05/03 0542) Temp Source: Oral (05/03 0542) BP: 148/72 (05/03 0542) Pulse Rate: 53 (05/03 0542)  Labs:  Recent Labs  04/23/17 0436 04/24/17 0720 04/25/17 0404  LABPROT 26.0* 27.7* 30.4*  INR 2.34 2.53 2.84    Estimated Creatinine Clearance: 29.7 mL/min (A) (by C-G formula based on SCr of 2.42 mg/dL (H)).   Medications:  Scheduled:  . acetaminophen  1,000 mg Oral QHS  . allopurinol  100 mg Oral Q breakfast  . feeding supplement  1 Container Oral BID BM  . gentamicin cream  1 application Topical Daily  . insulin aspart  0-9 Units Subcutaneous TID WC  . lactulose  20 g Oral BID  . loratadine  10 mg Oral Daily  . metoprolol  50 mg Oral BID  . mupirocin cream   Topical BID  . potassium chloride  30 mEq Oral Daily  . terazosin  5 mg Oral BH-q7a  . torsemide  40 mg Oral Daily  . traMADol  100 mg Oral QPM  . Warfarin - Pharmacist Dosing Inpatient   Does not apply q1800  . zolpidem  10 mg Oral QHS   Infusions:  . dialysis solution 2.5% low-MG/low-CA      Assessment: 76 yo male with afib is currently on therapeutic coumadin.  INR up to 2.84 from 2.53.  Goal of Therapy:  INR 2-3 Monitor platelets by anticoagulation protocol: Yes   Plan:  - coumadin 1 mg po x1 - INR in am  Chasidy Janak, Tsz-Yin 04/25/2017,8:08 AM

## 2017-04-25 NOTE — Progress Notes (Signed)
Occupational Therapy Session Note  Patient Details  Name: EOIN WILLDEN, MD MRN: [id number] Date of Birth: November 04, 1941  Today's Date: 04/25/2017 OT Individual Time: 1110-1204 OT Individual Time Calculation (min): 54 min    Short Term Goals: Week 2:  OT Short Term Goal 1 (Week 2): Continue working on established supervision to modified independent goals.  Skilled Therapeutic Interventions/Progress Updates:    Pt completed bathing and dressing sit to stand at the sink.  Overall supervision for all aspects this session, including doffing gripper socks, washing feet, and donning gripper socks.  Supervision for all sit to stand transitions with pt also standing with supervision to complete oral hygiene.  Pt's wife present throughout session as well to observe how he performed.  Pt with no report of pain.    Therapy Documentation Precautions:  Precautions Precautions: Fall Precaution Comments:   Restrictions Weight Bearing Restrictions: No  Pain: Pain Assessment Pain Assessment: No/denies pain ADL: See Function Navigator for Current Functional Status.   Therapy/Group: Individual Therapy  Chela Sutphen OTR/L 04/25/2017, 12:32 PM

## 2017-04-25 NOTE — Progress Notes (Signed)
Social Work Patient ID: [name] Fiddler, MD, male   DOB: 12/20/1941, 76 y.o.   MRN: [id number]   CSW met with pt and his wife to update them on team conference discussion and to finalize d/c plans.  Pt and wife both feel pt is ready to go home and are appreciative of pt's time on CIR.  CSW has ordered pt's DME and it does not seem pt will have time for f/u therapies yet, with all day PD training for the next 3 weeks.  CSW will ask for therapists to give pt home exercises and they will f/u with MD at f/u appointment once on PD is happening at home and they can arrange outpt therapies.  Spoke with PA, Marlowe Shores, about d/c paperwork tomorrow and arranged PT family ed for 11am tomorrow.  CSW will continue to follow and assist as needed.

## 2017-04-25 NOTE — Progress Notes (Signed)
Physical Therapy Note  Patient Details  Name: YASSER HEPP, MD MRN: [id number] Date of Birth: [birth year] Today's Date: 04/25/2017  0900-1000, 60 min individual tx Pain:none per pt  Pt still connected to peritoneal dialysis. In supine, pt performed therapeutic ex: 10 x 1 each RL/ straight leg raises, bil bridging, resisted R/L resisted PF;  20 x 1 lower trunk rotation, bil hip rotation, modified abdominal crunches, 30 x 1 alternating ankle pumps, 10 x 2 each R/L shoulder protraction.  Pt left resting in bed with all needs in place.  Kenyetta Wimbish 04/25/2017, 8:05 AM

## 2017-04-25 NOTE — Progress Notes (Signed)
Speech Language Pathology Daily Session Note  Patient Details  Name: [name] VANAMAN, MD MRN: [id number] Date of Birth: 1941-07-27  Today's Date: 04/25/2017 SLP Individual Time: 5830-9407 SLP Individual Time Calculation (min): 27 min  Short Term Goals: Week 2: SLP Short Term Goal 1 (Week 2): Pt will recall complex, new information with supervision verbal cues for use of external aids.   SLP Short Term Goal 2 (Week 2): Pt will complete complex tasks with mod I functional problem solving.   SLP Short Term Goal 3 (Week 2): Pt will recognize and correct errors during functional tasks with supervision verbal cues.    Skilled Therapeutic Interventions: Pt was seen for skilled ST targeting cognitive goals.  Pt was in the bathroom upon therapist's arrival and was overall supervision for safety awareness when ambulating from commode to his wheelchair.  Pt was able to recall that he was supposed to wait for his occupational therapist (who he remembered by name) to complete bathing and dressing tasks.  Pt was able to identify items and assistance needed at discharge with supervision question cues.  Pt was left in wheelchair at the end of today's therapy session with his wife at bedside.  Continue per current plan of care.       Function:  Eating Eating                 Cognition Comprehension Comprehension assist level: Follows complex conversation/direction with extra time/assistive device  Expression   Expression assist level: Expresses complex 90% of the time/cues < 10% of the time  Social Interaction Social Interaction assist level: Interacts appropriately with others with medication or extra time (anti-anxiety, antidepressant).  Problem Solving Problem solving assist level: Solves basic problems with no assist  Memory Memory assist level: Recognizes or recalls 90% of the time/requires cueing < 10% of the time    Pain Pain Assessment Pain Assessment: No/denies pain  Therapy/Group:  Individual Therapy  [name], Selinda Orion 04/25/2017, 12:42 PM

## 2017-04-25 NOTE — Progress Notes (Signed)
Admit: 04/16/2017 LOS: 9  31M new ESRD, inpatient supine PD start, in CIR  Subjective:  No new events Less drain pain, able to skip first drain Good UF Weights up and down, ? Accuracy Plan for Sat AM Discharge WIll do PD starting Monday at [facility] HT Has some brusing across b/l atn shins, not palpable, no surrounding erythema, on warfarin  05/02 0701 - 05/03 0700 In: 10434 [P.O.:480] Out: 12393 [Urine:300]  Filed Weights   04/24/17 0557 04/24/17 0800 04/25/17 1005  Weight: 94.4 kg (208 lb 1.8 oz) 92.4 kg (203 lb 11.3 oz) 95 kg (209 lb 7 oz)    Scheduled Meds: . acetaminophen  1,000 mg Oral QHS  . allopurinol  100 mg Oral Q breakfast  . feeding supplement  1 Container Oral BID BM  . gentamicin cream  1 application Topical Daily  . insulin aspart  0-9 Units Subcutaneous TID WC  . lactulose  20 g Oral BID  . loratadine  10 mg Oral Daily  . metoprolol  50 mg Oral BID  . mupirocin cream   Topical BID  . potassium chloride  30 mEq Oral Daily  . terazosin  5 mg Oral BH-q7a  . torsemide  40 mg Oral Daily  . traMADol  100 mg Oral QPM  . warfarin  1 mg Oral ONCE-1800  . Warfarin - Pharmacist Dosing Inpatient   Does not apply q1800  . zolpidem  10 mg Oral QHS   Continuous Infusions: . dialysis solution 2.5% low-MG/low-CA     PRN Meds:.heparin, hydrocortisone cream, ondansetron **OR** ondansetron (ZOFRAN) IV, polyvinyl alcohol, sorbitol  Current Labs: reviewed     Physical Exam:  Blood pressure (!) 146/67, pulse (!) 48, temperature 97.5 F (36.4 C), temperature source Oral, resp. rate 18, height 5\' 11"  (1.803 m), weight 95 kg (209 lb 7 oz), SpO2 100 %. NAD RRR CTAB 2+ LEE  PD cath in LUQ, directed downwards No asterixis, AAO x3  A 1. ESRD, new start on PD, supine 1. Cont CCPD 2L fill volume all supine 75min dwell, mix 2.5+4.25%; 5 exchanges, dry day 2. To start PD as outpt 5/7 2. Anemia of CKD: not majore issue currently 3. R sided CHF + Hypervolemia, cont to target net  negative with PD using higher dextrose 4. CKD-MBD: stable 5. Insomnia, chronic ambien therapy 6. Deconditioning in CIR  P 1. Cont PD current regimen 2. Labs in AM 3. Remove surgical incision sutures, was approved by Dr. Raul Del, surgeon   Pearson Grippe MD 04/25/2017, 12:09 PM   Recent Labs Lab 04/19/17 8592 04/21/17 0519 04/22/17 0702  NA 135 138 137  K 4.1 4.1 4.2  CL 99* 104 103  CO2 28 25 27   GLUCOSE 166* 195* 161*  BUN 47* 43* 45*  CREATININE 2.03* 2.12* 2.42*  CALCIUM 8.7* 8.6* 8.5*  PHOS 3.1 3.6 3.7    Recent Labs Lab 04/21/17 0519  WBC 3.6*  HGB 12.8*  HCT 38.6*  MCV 94.8  PLT 116*

## 2017-04-25 NOTE — Progress Notes (Signed)
[facility] PHYSICAL MEDICINE & REHABILITATION     PROGRESS NOTE  Subjective/Complaints:  Pt seen laying in bed this AM.  He states he slept well overnight after falling asleep.  Per nursing, there was an issue with pt's 24 hour urine and now patient states that he doesn't want it done and he will do it as an outpt.  ROS: Denies CP, SOB, N/V/D.  Objective: Vital Signs: Blood pressure (!) 148/72, pulse (!) 53, temperature 97.9 F (36.6 C), temperature source Oral, resp. rate 18, height 5\' 11"  (1.803 m), weight 92.4 kg (203 lb 11.3 oz), SpO2 100 %. No results found. No results for input(s): WBC, HGB, HCT, PLT in the last 72 hours. No results for input(s): NA, K, CL, GLUCOSE, BUN, CREATININE, CALCIUM in the last 72 hours.  Invalid input(s): CO CBG (last 3)   Recent Labs  04/24/17 1624 04/24/17 2047 04/25/17 0648  GLUCAP 111* 127* 184*    Wt Readings from Last 3 Encounters:  04/24/17 92.4 kg (203 lb 11.3 oz)  04/16/17 99.8 kg (220 lb 0.3 oz)  03/02/17 99.8 kg (220 lb)    Physical Exam:  BP (!) 148/72 (BP Location: Right Arm)   Pulse (!) 53   Temp 97.9 F (36.6 C) (Oral)   Resp 18   Ht 5\' 11"  (1.803 m)   Wt 92.4 kg (203 lb 11.3 oz)   SpO2 100%   BMI 28.41 kg/m  Constitutional: He appears well-developed. NAD. HENT: Normocephalic and atraumatic.  Eyes: EOMI. No discharge.  Cardiovascular: IRR. No JVD. +Murmur. Respiratory: Effort normal and breath sounds normal.  GI: Soft. Bowel sounds are normal. PD catheter noted.  Neurological:  Follows simple commands.  STM deficits.  Motor: 4+/5 throughout (stable) Skin.warm and dry. Dry dressing to LUE.  Some scattered bruising around abdomen.  Psych: Normal mood and affect.  Assessment/Plan: 1. Functional deficits secondary to debility which require 3+ hours per day of interdisciplinary therapy in a comprehensive inpatient rehab setting. Physiatrist is providing close team supervision and 24 hour management of active medical  problems listed below. Physiatrist and rehab team continue to assess barriers to discharge/monitor patient progress toward functional and medical goals.  Function:  Bathing Bathing position   Position: Wheelchair/chair at sink  Bathing parts Body parts bathed by patient: Right arm, Left arm, Chest, Abdomen, Front perineal area, Buttocks Body parts bathed by helper: Right lower leg, Left lower leg, Buttocks, Back  Bathing assist Assist Level: 2 helpers      Upper Body Dressing/Undressing Upper body dressing   What is the patient wearing?: Pull over shirt/dress     Pull over shirt/dress - Perfomed by patient: Thread/unthread right sleeve, Thread/unthread left sleeve, Put head through opening, Pull shirt over trunk Pull over shirt/dress - Perfomed by helper: Pull shirt over trunk        Upper body assist Assist Level: Supervision or verbal cues      Lower Body Dressing/Undressing Lower body dressing   What is the patient wearing?: Underwear, Pants, Shoes Underwear - Performed by patient: Thread/unthread right underwear leg, Thread/unthread left underwear leg Underwear - Performed by helper: Thread/unthread right underwear leg, Thread/unthread left underwear leg, Pull underwear up/down Pants- Performed by patient: Thread/unthread right pants leg, Thread/unthread left pants leg Pants- Performed by helper: Thread/unthread right pants leg, Thread/unthread left pants leg, Pull pants up/down Non-skid slipper socks- Performed by patient: Don/doff right sock, Don/doff left sock Non-skid slipper socks- Performed by helper: Don/doff right sock, Don/doff left sock Socks -  Performed by patient: Don/doff right sock   Shoes - Performed by patient: Don/doff right shoe, Don/doff left shoe Shoes - Performed by helper: Don/doff right shoe, Don/doff left shoe          Lower body assist Assist for lower body dressing:  (sock aide)      Toileting Toileting   Toileting steps completed by  patient: Adjust clothing prior to toileting, Performs perineal hygiene, Adjust clothing after toileting Toileting steps completed by helper: Adjust clothing after toileting Toileting Assistive Devices: Grab bar or rail  Toileting assist Assist level: Supervision or verbal cues   Transfers Chair/bed transfer Chair/bed transfer activity did not occur: Safety/medical concerns (dialysis) Chair/bed transfer method: Ambulatory Chair/bed transfer assist level: Supervision or verbal cues Chair/bed transfer assistive device: Film/video editor lift: Ecologist     Max distance: 150 ft Assist level: Supervision or verbal cues   Wheelchair   Type: Manual Max wheelchair distance: 50 ft Assist Level: Supervision or verbal cues  Cognition Comprehension Comprehension assist level: Follows complex conversation/direction with no assist  Expression Expression assist level: Expresses complex ideas: With no assist  Social Interaction Social Interaction assist level: Interacts appropriately with others - No medications needed.  Problem Solving Problem solving assist level: Solves complex problems: Recognizes & self-corrects  Memory Memory assist level: Complete Independence: No helper    Medical Problem List and Plan:  1. Weakness with cognitive deficits secondary to hepatic encephalopathy and multiple medical issues   Cont CIR  2. DVT Prophylaxis/Anticoagulation: Chronic Coumadin. Monitor for rebleeding episodes   INR therapeutic 5/3 3. Pain Management: Valium 10 mg daily as needed, Ultram 100 mg every 6 hours as needed  4. Mood: Provide emotional support  5. Neuropsych: This patient is not fully capable of making decisions on his own behalf.  6. Skin/Wound Care: Cleanse wounds to left hand Apply Bactroban Vaseline gauze for atraumatic dressing removed. Wrap with Kerlix changed daily  7. Fluids/Electrolytes/Nutrition: Routine I&Os 8. ESRD/CPPD. Status post peritoneal dialysis  catheter 04/05/2017. Follow-up renal services  9. 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 Monitor for any signs of fluid  Filed Weights   04/23/17 1905 04/24/17 0557 04/24/17 0800  Weight: 92.4 kg (203 lb 11.3 oz) 94.4 kg (208 lb 1.8 oz) 92.4 kg (203 lb 11.3 oz)   10. 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 Status post paracentesis 03/06/2017. Continue lactulose.   Ammonia 39 on 4/30  Patient refusing Chronulac.  Labs ordered for tomorrow 11. Atrial fibrillation. Continue Coumadin. Cardiac rate control  12. Hypertension. Lopressor 50 mg twice a day, Hytrin 5 mg daily, Demadex 40 mg daily   Monitor with increased mobility  Relatively controlled 5/3, but appears to be trending up  Will consider meds 13. Anemia of chronic disease.   Hb 12.8 on 4/29  Cont to monitor  Labs ordered for tomorrow 14. History of gout. Allopurinol 100 mg daily. Monitor for any gout flareups  15. DM type 2  Monitor             Amaryl d/ced, pt refusing stating bad reaction in past  Elevated 5/3, have discussed with patient, does not believe intervention is necessary/appropriate 16. Thrombocytopenia   Plts 116 on 4/29  Cont to monitor  Labs ordered for tomorrow 17. Hypoalbuminemia  Supplement initiated 4/26 18. Obesity  Body mass index is 28.41 kg/m.  Diet and exercise education  Encourage weight loss to increase endurance and promote overall health  LOS (Days) 9 A  FACE TO FACE EVALUATION WAS PERFORMED  Ankit Lorie Phenix 04/25/2017 8:32 AM

## 2017-04-25 NOTE — Patient Care Conference (Signed)
Inpatient RehabilitationTeam Conference and Plan of Care Update Date: 04/24/2017   Time: 2:10 PM    Patient Name: [name] HENKELS, MD      Medical Record Number: [id number]  Date of Birth: [birth year] Sex: Male         Room/Bed: 4W06C/4W06C-01 Payor Info: Payor: MEDICARE / Plan: MEDICARE PART A AND B / Product Type: *No Product type* /    Admitting Diagnosis: ABD Pain  Admit Date/Time:  04/16/2017  7:34 PM Admission Comments: No comment available   Primary Diagnosis:  <principal problem not specified> Principal Problem: <principal problem not specified>  Patient Active Problem List   Diagnosis Date Noted  . Peritoneal dialysis catheter in place [facility])   . Hyperammonemia ([place])   . Physical deconditioning 04/20/2017  . Diabetes mellitus type 2 in obese ([place])   . Hypoalbuminemia due to protein-calorie malnutrition ([place])   . Supratherapeutic INR   . Pain   . Atrial fibrillation ([place])   . Benign essential HTN   . Anemia of chronic disease   . Diabetes mellitus type 2 in nonobese ([facility])   . Thrombocytopenia ([place])   . Hepatic encephalopathy ([place]) 04/16/2017  . Confusion 04/08/2017  . Acute hepatic encephalopathy 04/08/2017  . ESRD on dialysis ([place]) 04/08/2017  . Chronic systolic CHF (congestive heart failure) ([place]) 09/26/2016  . Right heart failure ([place]) 06/26/2016  . Leg wound, left 06/26/2016  . Chronic diastolic CHF (congestive heart failure) ([place]) 03/22/2016  . Ascites 12/02/2015  . Diarrhea 12/02/2015  . Leg cramps 12/02/2015  . Acute on chronic diastolic CHF (congestive heart failure), NYHA class 1 ([place])   . Diastolic dysfunction 46/56/8127  . Dyspnea   . CHF (congestive heart failure) ([place]) 09/06/2015  . CAD (coronary artery disease) 05/11/2015  . S/P placement of cardiac pacemaker 05/10/2015  . Acute on chronic diastolic CHF (congestive heart failure) ([place]) 05/10/2015  . Cardiac device in situ   . Sick sinus syndrome ([place]) 05/05/2015  . Acute on chronic renal failure  ([place]) 03/23/2015  . Bradycardia, sinus, persistent, severe 03/23/2015  . Acute hyponatremia 03/23/2015  . Hyperkalemia 03/23/2015  . Rib pain on right side 03/23/2015  . Poor appetite 03/23/2015  . Acute gastrointestinal bleeding 03/23/2015  . Nausea & vomiting 03/23/2015  . H/O aortic valve replacement with tissue graft 12/09/2014  . Acute posthemorrhagic anemia 06/01/2014  . UTI (urinary tract infection) 05/31/2014  . Anxiety 05/31/2014  . Insomnia 05/31/2014  . Weakness 05/26/2014  . Fever 05/26/2014  . OA (osteoarthritis) of knee 05/24/2014  . Cataract 09/02/2013  . Osteoarthritis 09/02/2013  . Atrial flutter ([place]) 08/20/2012  . Back pain 08/20/2012  . Severe tricuspid regurgitation by prior echocardiogram 08/14/2011  . Aortic valve replaced 08/14/2011  . Chronic atrial fibrillation ([place]) 07/05/2011  . Chronic kidney disease (CKD), stage III (moderate)   . Hx of CABG 04/26/2011  . Diabetes ([place]) 04/26/2011  . Gout 04/26/2011  . BPH (benign prostatic hyperplasia) 04/26/2011  . Essential hypertension 04/26/2011  . MYOCARDIAL INFARCTION 11/27/2010  . Ischemic heart disease 11/27/2010  . Valvular heart disease 11/27/2010  . Sarcoidosis 11/24/2010  . Obstructive sleep apnea 11/24/2010  . Aortic valve disorder 11/24/2010  . DYSPNEA 11/24/2010    Expected Discharge Date: Expected Discharge Date: 04/27/17  Team Members Present: Physician leading conference: Dr. Delice Lesch Social Worker Present: Alfonse Alpers, LCSW Nurse Present: Other (comment) Genene Churn, RN) PT Present: Jorge Mandril, PT OT Present: Clyda Greener, OT SLP Present: Windell Moulding, SLP PPS Coordinator present :  Daiva Nakayama, RN, CRRN     Current Status/Progress Goal Weekly Team Focus  Medical   Weakness with cognitive deficits secondary to hepatic encephalopathy and multiple medical issues   Improve mobility, safety,   See above   Bowel/Bladder   Continent of bowel & bladder/ LBM 04/23/17/ peritoneal  dialysis  continence of bowel & bladder  continue to assist as needed   Swallow/Nutrition/ Hydration             ADL's   min guard assist for bathing and dressing sit to stand,  min guard assist for toilet transfers with use of the RW for support  supervision overall  selfcare retraining, balance retraining, pt/family education, DME education, transfer training, neuromuscular re-education   Mobility   close supervision for amb using SPC 150 ft, supervision bed mobility and basic transfers. Fatigues quickly, needing frequent rest breaks.   supervision overall, amb 175 ft with LRAD, up/down 12 steps, mod I bed mobility.   progress ambulation stability and endurance with LRAD, work on stairs. Pt/family edu in anticipation of D/C to home.    Communication   min-mod assist for awareness, recall, and problem solving   supervision   continue to address higher level cognition for safe discharge planning   Safety/Cognition/ Behavioral Observations            Pain   no c/o pain on shift/ has tylenol 1000mg  & ultram scheduled  pain scale <3  continue to assess & treat as needed   Skin   abd incisions with sutures, ecchymosis, scabs to left hand, refused tx, BLE edema & weeping, discolored like cellulitis  nonew areas of skin break down  continue to assess q shift    Rehab Goals Patient on target to meet rehab goals: Yes Rehab Goals Revised: none *See Care Plan and progress notes for long and short-term goals.  Barriers to Discharge: Safety, mobility, PD, hyperglycemia, hyperammonemia    Possible Resolutions to Barriers:  Therapies, educate on importance of compliance     Discharge Planning/Teaching Needs:  Pt to return to his home with his wife to provide supervision and assistance with PD.  Wife is going through family education currently.   Team Discussion:  Pt's blood glucose and ammonia levels are high, but pt does not want to treat.   Pt is doing well with UB ADLs, but has trouble  crossing legs to do LB.  Pt transfers at S level with cane and cueing.  Pt has overall S goals for OT and family training has occurred.  Pt is S with PT with one incident of LOB due to fatigue.  PT is requesting family ed training session for car tx and stairs.  CSW to set this up.  ST feels pt is about at baseline from PTA, except for pt's speech pattern (hesitation of thought) which pt had the last time his ammonia level was elevated.  Doesn't do much at home per wife and has S ST goals, especially with meds, finances, and safety awareness.  Wife is asking RN about setting up PD classes.  Revisions to Treatment Plan:  none   Continued Need for Acute Rehabilitation Level of Care: The patient requires daily medical management by a physician with specialized training in physical medicine and rehabilitation for the following conditions: Daily direction of a multidisciplinary physical rehabilitation program to ensure safe treatment while eliciting the highest outcome that is of practical value to the patient.: Yes Daily medical management of patient stability for  increased activity during participation in an intensive rehabilitation regime.: Yes Daily analysis of laboratory values and/or radiology reports with any subsequent need for medication adjustment of medical intervention for : Neurological problems;Diabetes problems;Other  Jeremih Dearmas, Silvestre Mesi 04/25/2017, 11:26 AM

## 2017-04-25 NOTE — Progress Notes (Signed)
Social Work Patient ID: [name], [name], male   DOB: 02/19/1941, 76 y.o.   MRN: [id number]   Lynnda Child, LCSW Social Worker Signed   Patient Care Conference Date of Service: 04/25/2017 11:26 AM      Hide copied text Hover for attribution information Inpatient RehabilitationTeam Conference and Plan of Care Update Date: 04/24/2017   Time: 2:10 PM      Patient Name: [name] OSCAR, [name]      Medical Record Number: [id number]  Date of Birth: 02-07-1941 Sex: Male         Room/Bed: 4W06C/4W06C-01 Payor Info: Payor: MEDICARE / Plan: MEDICARE PART A AND B / Product Type: *No Product type* /     Admitting Diagnosis: ABD Pain  Admit Date/Time:  04/16/2017  7:34 PM Admission Comments: No comment available    Primary Diagnosis:  <principal problem not specified> Principal Problem: <principal problem not specified>       Patient Active Problem List    Diagnosis Date Noted  . Peritoneal dialysis catheter in place [facility])    . Hyperammonemia ([place])    . Physical deconditioning 04/20/2017  . Diabetes mellitus type 2 in obese ([place])    . Hypoalbuminemia due to protein-calorie malnutrition ([place])    . Supratherapeutic INR    . Pain    . Atrial fibrillation ([place])    . Benign essential HTN    . Anemia of chronic disease    . Diabetes mellitus type 2 in nonobese (HCC)    . Thrombocytopenia ([place])    . Hepatic encephalopathy ([place]) 04/16/2017  . Confusion 04/08/2017  . Acute hepatic encephalopathy 04/08/2017  . ESRD on dialysis ([place]) 04/08/2017  . Chronic systolic CHF (congestive heart failure) ([place]) 09/26/2016  . Right heart failure ([place]) 06/26/2016  . Leg wound, left 06/26/2016  . Chronic diastolic CHF (congestive heart failure) ([place]) 03/22/2016  . Ascites 12/02/2015  . Diarrhea 12/02/2015  . Leg cramps 12/02/2015  . Acute on chronic diastolic CHF (congestive heart failure), NYHA class 1 ([place])    . Diastolic dysfunction 37/16/9678  . Dyspnea    . CHF (congestive heart failure)  ([place]) 09/06/2015  . CAD (coronary artery disease) 05/11/2015  . S/P placement of cardiac pacemaker 05/10/2015  . Acute on chronic diastolic CHF (congestive heart failure) ([place]) 05/10/2015  . Cardiac device in situ    . Sick sinus syndrome ([place]) 05/05/2015  . Acute on chronic renal failure ([place]) 03/23/2015  . Bradycardia, sinus, persistent, severe 03/23/2015  . Acute hyponatremia 03/23/2015  . Hyperkalemia 03/23/2015  . Rib pain on right side 03/23/2015  . Poor appetite 03/23/2015  . Acute gastrointestinal bleeding 03/23/2015  . Nausea & vomiting 03/23/2015  . H/O aortic valve replacement with tissue graft 12/09/2014  . Acute posthemorrhagic anemia 06/01/2014  . UTI (urinary tract infection) 05/31/2014  . Anxiety 05/31/2014  . Insomnia 05/31/2014  . Weakness 05/26/2014  . Fever 05/26/2014  . OA (osteoarthritis) of knee 05/24/2014  . Cataract 09/02/2013  . Osteoarthritis 09/02/2013  . Atrial flutter ([place]) 08/20/2012  . Back pain 08/20/2012  . Severe tricuspid regurgitation by prior echocardiogram 08/14/2011  . Aortic valve replaced 08/14/2011  . Chronic atrial fibrillation ([place]) 07/05/2011  . Chronic kidney disease (CKD), stage III (moderate)    . Hx of CABG 04/26/2011  . Diabetes ([place]) 04/26/2011  . Gout 04/26/2011  . BPH (benign prostatic hyperplasia) 04/26/2011  . Essential hypertension 04/26/2011  . MYOCARDIAL INFARCTION 11/27/2010  . Ischemic heart disease 11/27/2010  .  Valvular heart disease 11/27/2010  . Sarcoidosis 11/24/2010  . Obstructive sleep apnea 11/24/2010  . Aortic valve disorder 11/24/2010  . DYSPNEA 11/24/2010      Expected Discharge Date: Expected Discharge Date: 04/27/17   Team Members Present: Physician leading conference: Dr. Delice Lesch Social Worker Present: Alfonse Alpers, LCSW Nurse Present: Other (comment) Genene Churn, RN) PT Present: Jorge Mandril, PT OT Present: Clyda Greener, OT SLP Present: Windell Moulding, SLP PPS Coordinator present :  Daiva Nakayama, RN, CRRN       Current Status/Progress Goal Weekly Team Focus  Medical     Weakness with cognitive deficits secondary to hepatic encephalopathy and multiple medical issues   Improve mobility, safety,   See above   Bowel/Bladder     Continent of bowel & bladder/ LBM 04/23/17/ peritoneal dialysis  continence of bowel & bladder  continue to assist as needed   Swallow/Nutrition/ Hydration               ADL's     min guard assist for bathing and dressing sit to stand,  min guard assist for toilet transfers with use of the RW for support  supervision overall  selfcare retraining, balance retraining, pt/family education, DME education, transfer training, neuromuscular re-education   Mobility     close supervision for amb using SPC 150 ft, supervision bed mobility and basic transfers. Fatigues quickly, needing frequent rest breaks.   supervision overall, amb 175 ft with LRAD, up/down 12 steps, mod I bed mobility.   progress ambulation stability and endurance with LRAD, work on stairs. Pt/family edu in anticipation of D/C to home.    Communication     min-mod assist for awareness, recall, and problem solving   supervision   continue to address higher level cognition for safe discharge planning   Safety/Cognition/ Behavioral Observations             Pain     no c/o pain on shift/ has tylenol 1000mg  & ultram scheduled  pain scale <3  continue to assess & treat as needed   Skin     abd incisions with sutures, ecchymosis, scabs to left hand, refused tx, BLE edema & weeping, discolored like cellulitis  nonew areas of skin break down  continue to assess q shift     Rehab Goals Patient on target to meet rehab goals: Yes Rehab Goals Revised: none *See Care Plan and progress notes for long and short-term goals.   Barriers to Discharge: Safety, mobility, PD, hyperglycemia, hyperammonemia     Possible Resolutions to Barriers:  Therapies, educate on importance of compliance        Discharge Planning/Teaching Needs:  Pt to return to his home with his wife to provide supervision and assistance with PD.  Wife is going through family education currently.   Team Discussion:  Pt's blood glucose and ammonia levels are high, but pt does not want to treat.   Pt is doing well with UB ADLs, but has trouble crossing legs to do LB.  Pt transfers at S level with cane and cueing.  Pt has overall S goals for OT and family training has occurred.  Pt is S with PT with one incident of LOB due to fatigue.  PT is requesting family ed training session for car tx and stairs.  CSW to set this up.  ST feels pt is about at baseline from PTA, except for pt's speech pattern (hesitation of thought) which pt had the last time his ammonia level  was elevated.  Doesn't do much at home per wife and has S ST goals, especially with meds, finances, and safety awareness.  Wife is asking RN about setting up PD classes.  Revisions to Treatment Plan:  none    Continued Need for Acute Rehabilitation Level of Care: The patient requires daily medical management by a physician with specialized training in physical medicine and rehabilitation for the following conditions: Daily direction of a multidisciplinary physical rehabilitation program to ensure safe treatment while eliciting the highest outcome that is of practical value to the patient.: Yes Daily medical management of patient stability for increased activity during participation in an intensive rehabilitation regime.: Yes Daily analysis of laboratory values and/or radiology reports with any subsequent need for medication adjustment of medical intervention for : Neurological problems;Diabetes problems;Other   Angela Vazguez, Silvestre Mesi 04/25/2017, 11:26 AM

## 2017-04-25 NOTE — Progress Notes (Signed)
Occupational Therapy Session Note  Patient Details  Name: [name] HECKMANN, MD MRN: [id number] Date of Birth: 1941-02-24  Today's Date: 04/25/2017 OT Individual Time: 1446-1540 OT Individual Time Calculation (min): 54 min    Short Term Goals: Week 2:  OT Short Term Goal 1 (Week 2): Continue working on established supervision to modified independent goals.  Skilled Therapeutic Interventions/Progress Updates:    Pt completed functional mobility to and from the therapy gym during session with min guard assist and no assistive device.  Initial min assist needed for the first turn with LOB to the right but min guard for the rest of mobility.  He needed 3 standing rest breaks to complete mobility to the gym and also when coming back to the room.  Oxygen sats 99% on room air with HR at 74 BPM post mobility.  Had pt work in the gym on reaching to pick items up off of the floor and to various heights, all with min guard assist without any assistive device.  Had him complete Berg Balance test.  He scored 37/56 indicative of pt needing to use the walker at home and having a high risk of falls.  Pt's wife aware and observed with treatment also and both understand his fall risk and need for supervision constantly during mobility tasks. Finished session with pt resting in bedside recliner with call button and phone in reach.       Therapy Documentation Precautions:  Precautions Precautions: Fall Precaution Comments:   Restrictions Weight Bearing Restrictions: No  Pain: Pain Assessment Pain Assessment: No/denies pain ADL: See Function Navigator for Current Functional Status.   Therapy/Group: Individual Therapy  Zeth Buday OTR/L 04/25/2017, 3:56 PM

## 2017-04-26 ENCOUNTER — Inpatient Hospital Stay (HOSPITAL_COMMUNITY): Payer: Medicare Other | Admitting: Speech Pathology

## 2017-04-26 ENCOUNTER — Inpatient Hospital Stay (HOSPITAL_COMMUNITY): Payer: Medicare Other | Admitting: Occupational Therapy

## 2017-04-26 ENCOUNTER — Ambulatory Visit (HOSPITAL_COMMUNITY): Payer: Medicare Other | Admitting: Physical Therapy

## 2017-04-26 DIAGNOSIS — E876 Hypokalemia: Secondary | ICD-10-CM

## 2017-04-26 LAB — CBC WITH DIFFERENTIAL/PLATELET
Basophils Absolute: 0 10*3/uL (ref 0.0–0.1)
Basophils Relative: 0 %
Eosinophils Absolute: 0.1 10*3/uL (ref 0.0–0.7)
Eosinophils Relative: 5 %
HEMATOCRIT: 40.1 % (ref 39.0–52.0)
HEMOGLOBIN: 13.4 g/dL (ref 13.0–17.0)
LYMPHS ABS: 0.5 10*3/uL — AB (ref 0.7–4.0)
LYMPHS PCT: 16 %
MCH: 31.2 pg (ref 26.0–34.0)
MCHC: 33.4 g/dL (ref 30.0–36.0)
MCV: 93.3 fL (ref 78.0–100.0)
MONOS PCT: 15 %
Monocytes Absolute: 0.4 10*3/uL (ref 0.1–1.0)
NEUTROS ABS: 1.8 10*3/uL (ref 1.7–7.7)
NEUTROS PCT: 64 %
Platelets: 125 10*3/uL — ABNORMAL LOW (ref 150–400)
RBC: 4.3 MIL/uL (ref 4.22–5.81)
RDW: 16.2 % — ABNORMAL HIGH (ref 11.5–15.5)
WBC: 2.8 10*3/uL — ABNORMAL LOW (ref 4.0–10.5)

## 2017-04-26 LAB — AMMONIA: Ammonia: 61 umol/L — ABNORMAL HIGH (ref 9–35)

## 2017-04-26 LAB — COMPREHENSIVE METABOLIC PANEL
ALK PHOS: 109 U/L (ref 38–126)
ALT: 12 U/L — ABNORMAL LOW (ref 17–63)
ANION GAP: 9 (ref 5–15)
AST: 23 U/L (ref 15–41)
Albumin: 2.3 g/dL — ABNORMAL LOW (ref 3.5–5.0)
BILIRUBIN TOTAL: 1.4 mg/dL — AB (ref 0.3–1.2)
BUN: 39 mg/dL — ABNORMAL HIGH (ref 6–20)
CALCIUM: 8 mg/dL — AB (ref 8.9–10.3)
CO2: 29 mmol/L (ref 22–32)
Chloride: 98 mmol/L — ABNORMAL LOW (ref 101–111)
Creatinine, Ser: 2.21 mg/dL — ABNORMAL HIGH (ref 0.61–1.24)
GFR, EST AFRICAN AMERICAN: 31 mL/min — AB (ref >60)
GFR, EST NON AFRICAN AMERICAN: 27 mL/min — AB (ref >60)
GLUCOSE: 155 mg/dL — AB (ref 65–99)
Potassium: 3 mmol/L — ABNORMAL LOW (ref 3.5–5.1)
Sodium: 136 mmol/L (ref 135–145)
Total Protein: 5.5 g/dL — ABNORMAL LOW (ref 6.5–8.1)

## 2017-04-26 LAB — GLUCOSE, CAPILLARY
GLUCOSE-CAPILLARY: 132 mg/dL — AB (ref 65–99)
GLUCOSE-CAPILLARY: 138 mg/dL — AB (ref 65–99)
Glucose-Capillary: 164 mg/dL — ABNORMAL HIGH (ref 65–99)
Glucose-Capillary: 154 mg/dL — ABNORMAL HIGH (ref 65–99)

## 2017-04-26 LAB — PROTIME-INR
INR: 2.64
Prothrombin Time: 28.7 seconds — ABNORMAL HIGH (ref 11.4–15.2)

## 2017-04-26 MED ORDER — WARFARIN SODIUM 1 MG PO TABS
1.0000 mg | ORAL_TABLET | Freq: Once | ORAL | Status: AC
  Administered 2017-04-26: 1 mg via ORAL
  Filled 2017-04-26: qty 1

## 2017-04-26 MED ORDER — POTASSIUM CHLORIDE CRYS ER 15 MEQ PO TBCR: 30.0000 meq | EXTENDED_RELEASE_TABLET | Freq: Every day | ORAL | 1 refills | Status: DC

## 2017-04-26 MED ORDER — LACTULOSE 10 GM/15ML PO SOLN: 20.0000 g | Freq: Two times a day (BID) | ORAL | 0 refills | Status: DC

## 2017-04-26 MED ORDER — TRAMADOL HCL 50 MG PO TABS: 100.0000 mg | ORAL_TABLET | Freq: Every evening | ORAL | 0 refills | Status: DC

## 2017-04-26 MED ORDER — TORSEMIDE 20 MG PO TABS: 40.0000 mg | ORAL_TABLET | ORAL | 1 refills | Status: AC

## 2017-04-26 MED ORDER — METOPROLOL TARTRATE 50 MG PO TABS: 50.0000 mg | ORAL_TABLET | Freq: Two times a day (BID) | ORAL | 11 refills | Status: DC

## 2017-04-26 MED ORDER — TERAZOSIN HCL 5 MG PO CAPS: 5.0000 mg | ORAL_CAPSULE | ORAL | 6 refills | Status: AC

## 2017-04-26 MED ORDER — ALLOPURINOL 100 MG PO TABS: 100.0000 mg | ORAL_TABLET | Freq: Every day | ORAL | 1 refills | Status: DC

## 2017-04-26 MED ORDER — WARFARIN SODIUM 1 MG PO TABS: 1.0000 mg | ORAL_TABLET | Freq: Every day | ORAL | 1 refills | Status: AC

## 2017-04-26 MED ORDER — ZOLPIDEM TARTRATE 10 MG PO TABS: 10.0000 mg | ORAL_TABLET | Freq: Every evening | ORAL | 0 refills | Status: AC | PRN

## 2017-04-26 NOTE — Progress Notes (Signed)
[facility] PHYSICAL MEDICINE & REHABILITATION     PROGRESS NOTE  Subjective/Complaints:  Pt seen laying in bed this AM.  He states he slept great overnight.  He has questions about his sutures, but given time, he answers them on his own.   ROS: Denies CP, SOB, N/V/D.  Objective: Vital Signs: Blood pressure (!) 141/76, pulse (!) 58, temperature 98 F (36.7 C), temperature source Oral, resp. rate 18, height 5\' 11"  (1.803 m), weight 95.2 kg (209 lb 14.1 oz), SpO2 100 %. No results found.  Recent Labs  04/26/17 0456  WBC 2.8*  HGB 13.4  HCT 40.1  PLT 125*    Recent Labs  04/26/17 0456  NA 136  K 3.0*  CL 98*  GLUCOSE 155*  BUN 39*  CREATININE 2.21*  CALCIUM 8.0*   CBG (last 3)   Recent Labs  04/25/17 1641 04/25/17 2042 04/26/17 0633  GLUCAP 83 118* 164*    Wt Readings from Last 3 Encounters:  04/25/17 95.2 kg (209 lb 14.1 oz)  04/16/17 99.8 kg (220 lb 0.3 oz)  03/02/17 99.8 kg (220 lb)    Physical Exam:  BP (!) 141/76 (BP Location: Right Arm)   Pulse (!) 58   Temp 98 F (36.7 C) (Oral)   Resp 18   Ht 5\' 11"  (1.803 m)   Wt 95.2 kg (209 lb 14.1 oz)   SpO2 100%   BMI 29.27 kg/m  Constitutional: He appears well-developed. NAD. HENT: Normocephalic and atraumatic.  Eyes: EOMI. No discharge.  Cardiovascular: IRR. No JVD. +Murmur. Respiratory: Effort normal and breath sounds normal.  GI: Soft. Bowel sounds are normal. PD catheter noted.  Neurological:  Follows simple commands.  STM deficits.  Motor: 4+/5 throughout (unchanged) Skin.warm and dry. Dry dressing to LUE.  Some scattered bruising around abdomen.  Psych: Normal mood and affect.  Assessment/Plan: 1. Functional deficits secondary to debility which require 3+ hours per day of interdisciplinary therapy in a comprehensive inpatient rehab setting. Physiatrist is providing close team supervision and 24 hour management of active medical problems listed below. Physiatrist and rehab team continue to  assess barriers to discharge/monitor patient progress toward functional and medical goals.  Function:  Bathing Bathing position   Position: Wheelchair/chair at sink  Bathing parts Body parts bathed by patient: Right arm, Left arm, Chest, Abdomen, Front perineal area, Buttocks, Right upper leg, Left upper leg, Right lower leg, Left lower leg Body parts bathed by helper: Back  Bathing assist Assist Level: 2 helpers      Upper Body Dressing/Undressing Upper body dressing   What is the patient wearing?: Pull over shirt/dress     Pull over shirt/dress - Perfomed by patient: Thread/unthread right sleeve, Thread/unthread left sleeve, Put head through opening, Pull shirt over trunk Pull over shirt/dress - Perfomed by helper: Pull shirt over trunk        Upper body assist Assist Level: Set up      Lower Body Dressing/Undressing Lower body dressing   What is the patient wearing?: Underwear, Pants, Shoes Underwear - Performed by patient: Thread/unthread right underwear leg, Thread/unthread left underwear leg Underwear - Performed by helper: Thread/unthread right underwear leg, Thread/unthread left underwear leg, Pull underwear up/down Pants- Performed by patient: Thread/unthread right pants leg, Thread/unthread left pants leg Pants- Performed by helper: Thread/unthread right pants leg, Thread/unthread left pants leg, Pull pants up/down Non-skid slipper socks- Performed by patient: Don/doff right sock, Don/doff left sock Non-skid slipper socks- Performed by helper: Don/doff right sock, Don/doff left  sock Socks - Performed by patient: Don/doff right sock   Shoes - Performed by patient: Don/doff right shoe, Don/doff left shoe Shoes - Performed by helper: Don/doff right shoe, Don/doff left shoe          Lower body assist Assist for lower body dressing: Supervision or verbal cues      Toileting Toileting   Toileting steps completed by patient: Adjust clothing prior to toileting,  Performs perineal hygiene, Adjust clothing after toileting Toileting steps completed by helper: Adjust clothing after toileting Toileting Assistive Devices: Grab bar or rail  Toileting assist Assist level: Supervision or verbal cues   Transfers Chair/bed transfer Chair/bed transfer activity did not occur: Safety/medical concerns (dialysis) Chair/bed transfer method: Ambulatory Chair/bed transfer assist level: Supervision or verbal cues Chair/bed transfer assistive device: Film/video editor lift: Ecologist     Max distance: 150 ft Assist level: Supervision or verbal cues   Wheelchair   Type: Manual Max wheelchair distance: 50 ft Assist Level: Supervision or verbal cues  Cognition Comprehension Comprehension assist level: Follows complex conversation/direction with extra time/assistive device  Expression Expression assist level: Expresses complex 90% of the time/cues < 10% of the time  Social Interaction Social Interaction assist level: Interacts appropriately with others with medication or extra time (anti-anxiety, antidepressant).  Problem Solving Problem solving assist level: Solves basic problems with no assist  Memory Memory assist level: Recognizes or recalls 90% of the time/requires cueing < 10% of the time    Medical Problem List and Plan:  1. Weakness with cognitive deficits secondary to hepatic encephalopathy and multiple medical issues   Cont CIR, plan for d/c tomorrow 2. DVT Prophylaxis/Anticoagulation: Chronic Coumadin. Monitor for rebleeding episodes   INR therapeutic 5/4 3. Pain Management: Valium 10 mg daily as needed, Ultram 100 mg every 6 hours as needed  4. Mood: Provide emotional support  5. Neuropsych: This patient is not fully capable of making decisions on his own behalf.  6. Skin/Wound Care: Cleanse wounds to left hand Apply Bactroban Vaseline gauze for atraumatic dressing removed. Wrap with Kerlix changed daily  7.  Fluids/Electrolytes/Nutrition: Routine I&Os 8. ESRD/CPPD. Status post peritoneal dialysis catheter 04/05/2017. Follow-up renal services  9. 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 Monitor for any signs of fluid  Filed Weights   04/24/17 0800 04/25/17 1005 04/25/17 2047  Weight: 92.4 kg (203 lb 11.3 oz) 95 kg (209 lb 7 oz) 95.2 kg (209 lb 14.1 oz)   10. 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 Status post paracentesis 03/06/2017. Continue lactulose.   Ammonia 61 on 5/4  Patient refusing Chronulac, educated 11. Atrial fibrillation. Continue Coumadin. Cardiac rate control  12. Hypertension. Lopressor 50 mg twice a day, Hytrin 5 mg daily, Demadex 40 mg daily   Monitor with increased mobility  Slightly labile 5/4  Will need further ambulatory monitoring 13. Anemia: Resolved  Hb 13.4 on 5/4  Cont to monitor 14. History of gout. Allopurinol 100 mg daily. Monitor for any gout flareups  15. DM type 2  Monitor             Amaryl d/ced, pt refusing stating bad reaction in past  Labile 5/4, have discussed with patient, does not believe intervention is necessary/appropriate 16. Thrombocytopenia   Plts 125 on 5/4  Cont to monitor 17. Hypoalbuminemia  Supplement initiated 4/26 18. Obesity  Body mass index is 29.27 kg/m.  Diet and exercise education  Encourage weight loss to increase endurance and promote overall health 19.  Hypokalemia  Patient has been refusing K+ supplementation, educated  LOS (Days) [place] 04/26/2017 8:31 AM

## 2017-04-26 NOTE — Discharge Instructions (Signed)
Inpatient Rehab Discharge Instructions  Marylynn Pearson II, MD Discharge date and time: No discharge date for patient encounter.   Activities/Precautions/ Functional Status: Activity: activity as tolerated Diet: diabetic diet Wound Care: keep wound clean and dry Functional status:  ___ No restrictions     ___ Walk up steps independently ___ 24/7 supervision/assistance   ___ Walk up steps with assistance ___ Intermittent supervision/assistance  ___ Bathe/dress independently ___ Walk with walker     _x__ Bathe/dress with assistance ___ Walk Independently    ___ Shower independently ___ Walk with assistance    ___ Shower with assistance ___ No alcohol     ___ Return to work/school ________  COMMUNITY REFERRALS UPON DISCHARGE:   Home Health:   Complete your home exercise program given to you by your therapists. Medical Equipment/Items Ordered:  Engineer, production:  [place]      Phone:  [phone]  Special Instructions: Continue peritoneal dialysis as directed per renal services at [facility]  Weekly prothrombin times while maintained on chronic Coumadin to be completed with dialysis   My questions have been answered and I understand these instructions. I will adhere to these goals and the provided educational materials after my discharge from the hospital.  Patient/Caregiver Signature _______________________________ Date __________  Clinician Signature _______________________________________ Date __________  Please bring this form and your medication list with you to all your follow-up doctor's appointments.

## 2017-04-26 NOTE — Progress Notes (Addendum)
Admit: 04/16/2017 LOS: 10  70M new ESRD, inpatient supine PD start, in CIR  Subjective:  No new events. Remains planned for discharge tomorrow Most sutures removed yesterday, however 3 remain, I removed them at the bedside No sutures were surrounding the PD catheter Labs this morning show mild hypokalemia (discussed increase dietary potassium and will be repeated on 5/7) and mild increase in serum ammonia (patient had not been taking lactulose and will resume once daily) Weights are stable, continuing to use mixture of 2.5% and 4.25% dextrose in dialysate Patient and wife are aware that next week there will be no formal training, only dialysis, based on limited staffing He has nonpalpable bruising across both anterior shins which is stable compared to yesterday, we are monitoring and most likely related to unobserved trauma in the setting of age and anticoagulation  05/03 0701 - 05/04 0700 In: 20335 [P.O.:480] Out: 12568 [Urine:100]  Filed Weights   04/24/17 0800 04/25/17 1005 04/25/17 2047  Weight: 92.4 kg (203 lb 11.3 oz) 95 kg (209 lb 7 oz) 95.2 kg (209 lb 14.1 oz)    Scheduled Meds: . acetaminophen  1,000 mg Oral QHS  . allopurinol  100 mg Oral Q breakfast  . feeding supplement  1 Container Oral BID BM  . gentamicin cream  1 application Topical Daily  . insulin aspart  0-9 Units Subcutaneous TID WC  . lactulose  20 g Oral BID  . loratadine  10 mg Oral Daily  . metoprolol  50 mg Oral BID  . mupirocin cream   Topical BID  . potassium chloride  30 mEq Oral Daily  . terazosin  5 mg Oral BH-q7a  . torsemide  40 mg Oral Daily  . traMADol  100 mg Oral QPM  . Warfarin - Pharmacist Dosing Inpatient   Does not apply q1800  . zolpidem  10 mg Oral QHS   Continuous Infusions: . dialysis solution 2.5% low-MG/low-CA     PRN Meds:.heparin, hydrocortisone cream, ondansetron **OR** ondansetron (ZOFRAN) IV, polyvinyl alcohol, sorbitol  Current Labs: reviewed     Physical Exam:  Blood  pressure (!) 141/76, pulse (!) 58, temperature 98 F (36.7 C), temperature source Oral, resp. rate 18, height 5\' 11"  (1.803 m), weight 95.2 kg (209 lb 14.1 oz), SpO2 100 %. NAD RRR CTAB 2+ LEE  PD cath in LUQ, directed downwards No asterixis, AAO x3  A 1. ESRD, new start on PD, supine 1. Cont CCPD 2L fill volume all supine 51min dwell, mix 2.5+4.25%; 5 exchanges, dry day 2. To start PD as outpt 5/7 2. Anemia of CKD: not majore issue currently 3. R sided CHF + Hypervolemia, cont to target net negative with PD using higher dextrose 4. CKD-MBD: stable 5. Insomnia, chronic ambien therapy 6. Deconditioning in CIR 7. Hypokalemia 8. Ascites and cirrhosis  P 1. Cont PD current regimen 2. Increase dietary potassium, repeat on 5/7 CMP 3. Resume once daily lactulose targeting 1-3 bowel movements daily of soft consistency and no cognitive blunting    Pearson Grippe MD 04/26/2017, 12:15 PM   Recent Labs Lab 04/21/17 0519 04/22/17 0702 04/26/17 0456  NA 138 137 136  K 4.1 4.2 3.0*  CL 104 103 98*  CO2 25 27 29   GLUCOSE 195* 161* 155*  BUN 43* 45* 39*  CREATININE 2.12* 2.42* 2.21*  CALCIUM 8.6* 8.5* 8.0*  PHOS 3.6 3.7  --     Recent Labs Lab 04/21/17 0519 04/26/17 0456  WBC 3.6* 2.8*  NEUTROABS  --  1.8  HGB 12.8* 13.4  HCT 38.6* 40.1  MCV 94.8 93.3  PLT 116* 125*

## 2017-04-26 NOTE — Plan of Care (Signed)
Problem: RH Awareness Goal: LTG: Patient will demonstrate intellectual/emergent (OT) LTG: Patient will demonstrate intellectual/emergent/anticipatory awareness with assist during a functional activity  (OT)  Outcome: Not Met (add Reason) Needs min assist

## 2017-04-26 NOTE — Progress Notes (Signed)
Occupational Therapy Discharge Summary  Patient Details  Name: [name] ZUKOWSKI, MD MRN: [id number] Date of Birth: Apr 20, 1941  Today's Date: 04/26/2017 OT Individual Time: 0093-8182 OT Individual Time Calculation (min): 42 min   Session Note:  Pt completed functional mobility to the therapy gym with supervision using the RW.  Educated pt on BUE strengthening exercises in sitting.  Pt completed 2 sets of 15 reps for shoulder extension with light resistance orange theraband, 1 set of 20 repetitions for elbow flexion with 1.5 lbs weight, and 1 set of 20 reps for elbow extension using light resistance orange theraband.  Also educated pt on AAROM shoulder flexion with use of dowel rod for 1 set of 10 repetitions with min assist.  Instructed pt to perform this exercise in supine at home secondary to right shoulder impairment, in order to assist with gravity elimination.  Pt ambulated back to room at conclusion of exercises.  Pt was seated in his wheelchair at bedside with call button and phone in reach.     Patient has met 9 of 10 long term goals due to improved balance, ability to compensate for deficits and improved awareness.  Patient to discharge at overall Supervision level.  Patient's care partner is independent to provide the necessary physical and cognitive assistance at discharge.    Reasons goals not met: Pt needs min assist for anticipatory awareness  Recommendation:  Patient will benefit from ongoing skilled OT services in outpatient setting to continue to advance functional skills in the area of BADL.  Pt still needs supervision for safety secondary to demonstrating decreased balance, decreased awareness, and overall decreased endurance.  Feel he will benefit from continued OT to progress to modified independent level for home.    Equipment: No equipment provided  Reasons for discharge: treatment goals met and discharge from hospital  Patient/family agrees with progress made and goals  achieved: Yes  OT Discharge Precautions/Restrictions  Precautions Precautions: Fall Restrictions Weight Bearing Restrictions: No  Pain Pain Assessment Pain Assessment: No/denies pain ADL  See Function Section of chart for details  Vision/Perception  Vision- Assessment Eye Alignment: Within Functional Limits Perception Perception: Within Functional Limits Praxis Praxis: Intact  Cognition Overall Cognitive Status: History of cognitive impairments - at baseline Arousal/Alertness: Awake/alert Orientation Level: Oriented X4 Attention: Selective Selective Attention: Appears intact Memory: Impaired Memory Impairment: Decreased recall of new information Awareness: Impaired Awareness Impairment: Anticipatory impairment Problem Solving: Impaired Problem Solving Impairment: Functional complex Safety/Judgment: Appears intact Sensation Sensation Light Touch: Appears Intact Stereognosis: Appears Intact Hot/Cold: Appears Intact Proprioception: Appears Intact Coordination Gross Motor Movements are Fluid and Coordinated: Yes Fine Motor Movements are Fluid and Coordinated: Yes Motor  Motor Motor - Skilled Clinical Observations: generalized weakness  Mobility  Transfers Transfers: Sit to Stand;Stand to Sit Sit to Stand: With armrests;With upper extremity assist;5: Supervision Stand to Sit: 5: Supervision;With upper extremity assist;With armrests  Trunk/Postural Assessment  Cervical Assessment Cervical Assessment: Within Functional Limits Thoracic Assessment Thoracic Assessment: Exceptions to [facility] (thoracic rounding) Lumbar Assessment Lumbar Assessment: Exceptions to [facility] (posterior pelvic tilt and lumbar flexion noted in static sitting and during mobility)  Balance Balance Balance Assessed: Yes Static Sitting Balance Static Sitting - Balance Support: No upper extremity supported Static Sitting - Level of Assistance: 7: Independent Dynamic Sitting Balance Dynamic Sitting -  Balance Support: Feet supported;No upper extremity supported Dynamic Sitting - Level of Assistance: 7: Independent Static Standing Balance Static Standing - Balance Support: During functional activity Static Standing - Level of Assistance: 5:  Stand by assistance Dynamic Standing Balance Dynamic Standing - Balance Support: During functional activity Dynamic Standing - Level of Assistance: 5: Stand by assistance Extremity/Trunk Assessment RUE Assessment RUE Assessment: Exceptions to [facility] RUE Strength RUE Overall Strength Comments: Pt with history of right rotator cuff and biceps injury.  Shoulder flexion AROM 0-80.  All other joints AROM WFLs.   LUE Assessment LUE Assessment: Within Functional Limits LUE Strength LUE Overall Strength Comments: strength 3+/5 throughout   See Function Navigator for Current Functional Status.  Clinton OTR/L 04/26/2017, 4:35 PM

## 2017-04-26 NOTE — Progress Notes (Signed)
Social Work Discharge Note  The overall goal for the admission was met for:   Discharge location: Yes - home  Length of Stay: Yes - 11 days  Discharge activity level: Yes - supervision  Home/community participation: Yes  Services provided included: MD, RD, PT, OT, SLP, RN, Pharmacy and SW  Financial Services: Medicare and Private Insurance: [place] [employer]  Follow-up services arranged: DME: transport chair from [employer].  Pt to use standard walker he has at home. and Patient/Family has no preference for HH/DME agencies  Comments (or additional information): CSW spent a lot of time talking with pt and his wife about PD training and questions.  Called the home training center and got some information, but also gave wife Telly's number there so she could ask her questions directly.  Ordered transport chair, but they are going to use all other DME they have a home (standard walker, shower DME, etc).  They plan to hold off on hospital bed, but if pt cannot manage at home, they will call CSW to assist.  Pt does not have time to do f/u therapies right now due to PD schedule.  CSW asked that therapists give pt a home exercise plan.    Patient/Family verbalized understanding of follow-up arrangements: Yes  Individual responsible for coordination of the follow-up plan: Pt's wife  Confirmed correct DME delivered: Trey Sailors 04/26/2017    Takoya Jonas, Silvestre Mesi

## 2017-04-26 NOTE — Discharge Summary (Signed)
NAMESEDALE, JENIFER               ACCOUNT NO.:  [id number]  MEDICAL RECORD NO.:  [id number]  LOCATION:                                 FACILITY:  PHYSICIAN:  Lauraine Rinne, P.A.  DATE OF BIRTH:  [birth year]  DATE OF ADMISSION:  04/16/2017 DATE OF DISCHARGE:  04/27/2017                              DISCHARGE SUMMARY   DISCHARGE DIAGNOSES: 1. Weakness with cognitive deficits secondary to hepatic     encephalopathy with multiple medical issues. 2. Chronic Coumadin therapy. 3. Pain management. 4. End-stage renal disease with peritoneal dialysis. 5. 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 6. Hepatic encephalopathy with ascites. 7. Atrial fibrillation. 8. Hypertension. 9. Anemia of chronic disease. 10.History of gout. 11.Diabetes mellitus. 12.Thrombocytopenia. 13.Morbid obesity.  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  Per chart review, lives with spouse, independent with assistive device prior to admission, the patient with recent umbilical hernia repair as well as placement of peritoneal dialysis catheter by Dr. Raul Del for anticipation of peritoneal dialysis.  No hemodialysis due to advanced congestive heart failure.  Presented on April 08, 2017, with altered mental status, scrotal edema, abdominal discomfort, no chest pain, sodium 129, BUN 133, creatinine 4.3, ammonia 79 and started on lactulose.  Noted recent paracentesis with 2 L yield.  Hemodialysis, peritoneal dialysis initiated, followup with General Surgery for scrotal edema, advised conservative care.  Wound care nurse followup for left dorsal hand  wound from a dog scratch, skin care as directed.  Lactulose adjusted, ammonia level 77, Xarelto was not recommended while on dialysis and switched to Coumadin therapy.  The patient was admitted for comprehensive rehab program.  PAST MEDICAL HISTORY:  See discharge diagnoses.  SOCIAL HISTORY:  He lives with wife.  Used assistive device prior to admission.  FUNCTIONAL STATUS UPON ADMISSION TO REHAB SERVICES:  Minimal assist for rolling, +2 physical assist sit to supine, mod to max assist activities of daily living.  PHYSICAL EXAMINATION:  VITAL SIGNS:  Blood pressure 140/58, pulse 54, temperature 98, and respirations 17. GENERAL:  This was an alert male, in no acute distress. HEENT:  Normocephalic.  EOMs intact. NECK:  Supple and nontender.  No JVD. CARDIAC:  Rate controlled. ABDOMEN:  Soft and nontender.  Good bowel sounds. LUNGS:  Clear to auscultation without wheeze. GENITOURINARY:  Peritoneal dialysis catheter noted. NEUROLOGICAL:  Fair awareness of deficits.  Followed commands. Bilateral upper extremity strength grossly graded 3- to 3/5 proximal, 4/5 distal; bilateral lower extremities 3-/5 hip flexors, 3/5 knee extension, 4/5 ankle dorsi and plantarflexion.  REHABILITATION HOSPITAL COURSE:  The patient was admitted to Inpatient Rehab Services with therapies initiated on a 3-hour daily basis consisting of physical therapy, occupational therapy, and rehabilitation nursing.  The following issues were addressed during the patient's rehabilitation stay.  Pertaining to Dr. Marigene Ehlers hepatic encephalopathy, he continued to participate with therapies with progressive gains, maintained on chronic Coumadin for atrial fibrillation.  He had been on Xarelto, which was contraindicated with dialysis initiated, thus Coumadin was started, no bleeding episodes.  Pain management with use of Ultram.  Peritoneal dialysis followup per Renal Services.  He was to start peritoneal dialysis as an  outpatient on Apr 29, 2017, at the [facility].  Blood pressures remained well controlled and monitored, cardiac rate, no chest pain or shortness of breath.  Anemia of chronic disease, 12.8, stable.  Diabetes mellitus, peripheral neuropathy.  Amaryl discontinued, the patient was refusing this medication.  Thrombocytopenia, platelets 116, monitored with dialysis. The patient received weekly collaborative interdisciplinary team conferences to discuss estimated length of stay, family teaching, any barriers to his discharge.  The patient was needing some encouragement at times to participate.  Ambulates within his room to the bathroom without assistive device, but holding to various objects for balance, close supervision overall, utilizes the urinal when standing position. Performed hand hygiene at the sink with supervision without loss of balance, ambulates to the room, gymnasium with a straight-point cane and supervision, working with energy conservation techniques.  He could gather his belongings for activities of daily living and homemaking. Full family teaching was completed and planned discharge to home.  DISCHARGE MEDICATIONS: 1. Allopurinol 100 mg p.o. daily. 2. Chronulac 20 mg p.o. b.i.d. 3. Claritin 10 mg p.o. daily. 4. Lopressor 50 mg p.o. b.i.d. 5. Potassium chloride 30 mEq p.o. daily. 6. Hytrin 5 mg p.o. daily. 7. Demadex 40 mg p.o. daily. 8. Coumadin 1 mg daily, adjusted accordingly for INR of 2.0-3.0. 9. Ultram as needed pain.  DISCHARGE INSTRUCTIONS:  His diet was a diabetic diet.  The patient would follow up with Dr. Delice Lesch at the Outpatient Rehab Service office as advised; Dr. Madelon Lips, Renal Services, call for appointment; Dr. Leotis Pain, Medical Management.  SPECIAL INSTRUCTIONS:  Continue peritoneal dialysis as per Renal Services at the [facility].  Weekly prothrombin times. Will maintain on chronic Coumadin, to be  completed with dialysis.     Lauraine Rinne, P.A.     DA/MEDQ  D:  04/26/2017  T:  04/26/2017  Job:  [id number]  cc:   Dr. Madelon Lips Dr. Cipriano Mile Delice Lesch, MD

## 2017-04-26 NOTE — Progress Notes (Signed)
Occupational Therapy Session Note  Patient Details  Name: [name] MCCARRY, MD MRN: [id number] Date of Birth: Jul 28, 1941  Today's Date: 04/26/2017 OT Individual Time: 0945-1000 OT Individual Time Calculation (min): 15 min    Short Term Goals: Week 2:  OT Short Term Goal 1 (Week 2): Continue working on established supervision to modified independent goals.  Skilled Therapeutic Interventions/Progress Updates:    Pt completed transfer supine to sit EOB with supervision using rail this session.  He then ambulated to the bathroom with use of the RW for support with close supervision.  [name] transferred to the elevated toilet with 3:1 over top for toileting tasks and removed his underpants in standing prior to sitting.  Pt left with NT to finish secondary to time limitations.  Pt still finishing dialysis at start of session and nursing in to complete disconnect process so environment needed to be sterile and pt could not participate in therapy for first 45 mins.   Therapy Documentation Precautions:  Precautions Precautions: Fall Precaution Comments:   Restrictions Weight Bearing Restrictions: No General: General OT Amount of Missed Time: 45 Minutes Vital Signs:  Pain: Pain Assessment Pain Assessment: No/denies pain ADL: See Function Navigator for Current Functional Status.   Therapy/Group: Individual Therapy  Dajion Bickford OTR/L  04/26/2017, 10:55 AM

## 2017-04-26 NOTE — Progress Notes (Signed)
Speech Language Pathology Discharge Summary  Patient Details  Name: [name] DOUSE, [name] MRN: [id number] Date of Birth: [birth year]  Today's Date: 04/26/2017 SLP Individual Time: 1430-1500 SLP Individual Time Calculation (min): 30 min   Skilled Therapeutic Interventions:  Pt was seen for skilled ST targeting cognitive goals.  SLP administered the [facility] standardized cognitive assessment to measure progress from initial evaluation.  Pt scored 26/30 on evaluation; n>/= 26.  Per therapist's skilled observations and pt's report, suspect that pt is at his cognitive baseline.  He was able to recall specific details about his upcoming medical care management with mod I and appeared better aware of how his current level of function will impact his independence in the home environment as evidenced by his ability to generate examples of appropriate tasks and routines that he will complete at home with supervision question cues.  Pt in agreement with no further need for ST follow up.  All questions were answered to his satisfaction at this time.  Pt left sitting in wheelchair with call bell within reach.       Patient has met 3 of 3 long term goals.  Patient to discharge at overall Supervision level.  Reasons goals not met:     Clinical Impression/Discharge Summary:   Pt has made functional gains this reporting period and is discharging having met 3 out of 3 long term goals.  Pt is currently an overall supervision level assist for semi-complex tasks due to mild cognitive impairment which therapist now believes to be back to pt's baseline.  Pt is discharging home with 24/7 supervision from his wife.  All pt and family education is complete.  No further ST needs are indicated at this time.   Care Partner:  Caregiver Able to Provide Assistance: Yes  Type of Caregiver Assistance: Physical;Cognitive  Recommendation:  None      Equipment: none recommended by SLP    Reasons for discharge: Discharged from  hospital   Patient/Family Agrees with Progress Made and Goals Achieved: Yes   Function:  Eating Eating                 Cognition Comprehension Comprehension assist level: Understands complex 90% of the time/cues 10% of the time  Expression   Expression assist level: Expresses complex 90% of the time/cues < 10% of the time  Social Interaction Social Interaction assist level: Interacts appropriately with others with medication or extra time (anti-anxiety, antidepressant).  Problem Solving Problem solving assist level: Solves complex 90% of the time/cues < 10% of the time  Memory Memory assist level: Recognizes or recalls 90% of the time/requires cueing < 10% of the time   Windell Moulding L 04/26/2017, 4:30 PM

## 2017-04-26 NOTE — Discharge Summary (Signed)
Discharge summary job [id number]

## 2017-04-26 NOTE — Progress Notes (Signed)
ANTICOAGULATION CONSULT NOTE - Follow Up Consult  Pharmacy Consult for coumadin Indication: atrial fibrillation  Allergies  Allergen Reactions  . Actos [Pioglitazone Hydrochloride] Swelling and Other (See Comments)    Severe peripheral edema  . Codeine Other (See Comments)    Makes Pt aggressive  . Depakote [Divalproex Sodium] Diarrhea    severe  . Diltiazem Other (See Comments)    lethargy and dyspnea  . Hydralazine Other (See Comments)    Severe chills and SOB   . Isosorbide Dinitrate Other (See Comments)    Severe chills and SOB   . Januvia [Sitagliptin] Diarrhea and Other (See Comments)    bradycardia  . Loop Diuretics Other (See Comments)    Spike in BUN and creatine levels  . Losartan Other (See Comments)    aphasia  . Nsaids Other (See Comments)    Renal problems  . Onglyza [Saxagliptin] Diarrhea and Other (See Comments)    bradycardia  . Orudis [Ketoprofen] Other (See Comments)    Cannot take due to renal insufficiency  . Penicillins Swelling and Other (See Comments)    Serum sickness Has patient had a PCN reaction causing immediate rash, facial/tongue/throat swelling, SOB or lightheadedness with hypotension: Yes Has patient had a PCN reaction causing severe rash involving mucus membranes or skin necrosis: No Has patient had a PCN reaction that required hospitalization Yes Has patient had a PCN reaction occurring within the last 10 years: No If all of the above answers are "NO", then may proceed with Cephalosporin use.   [name] Potassium Iodide Itching and Rash    Severe entire body itching and rash  . Rozerem [Ramelteon] Diarrhea    Severe   . Amiodarone Other (See Comments)    diarrhea  . Latex Swelling  . Verapamil Other (See Comments)    Severe fatigue  . Benazepril Hcl Other (See Comments)    ? Possible lowers platelets  . Indomethacin Other (See Comments)    Renal problems   . Minocycline Other (See Comments)    Makes dizzy and felt just lousy.  [name]  Pentazocine Lactate Other (See Comments)    Unknown allergic reaction - pt and wife do not recall this  . Poison Ivy Extract [Poison Ivy Extract] Rash and Other (See Comments)    blisters  . Sertraline Hcl Other (See Comments)    Pt and wife do not recall this  . Shellfish Allergy Rash    Patient Measurements: Height: 5\' 11"  (180.3 cm) Weight: 209 lb 14.1 oz (95.2 kg) IBW/kg (Calculated) : 75.3 Heparin Dosing Weight:   Vital Signs: Temp: 98 F (36.7 C) (05/04 0417) Temp Source: Oral (05/04 0417) BP: 141/76 (05/04 0417) Pulse Rate: 58 (05/04 0417)  Labs:  Recent Labs  04/24/17 0720 04/25/17 0404 04/26/17 0456  HGB  --   --  13.4  HCT  --   --  40.1  PLT  --   --  125*  LABPROT 27.7* 30.4* 28.7*  INR 2.53 2.84 2.64  CREATININE  --   --  2.21*    Estimated Creatinine Clearance: 33 mL/min (A) (by C-G formula based on SCr of 2.21 mg/dL (H)).   Assessment: 76 yo male with afib is currently on therapeutic coumadin.  INR 2.64. Hgb 13.4, pltc 125K, stable. No bleeding noted per chart.   Goal of Therapy:  INR 2-3 Monitor platelets by anticoagulation protocol: Yes   Plan:  - Coumadin 1 mg po x1 - Plan for discharge, recommend coumadin 1mg  daily with  outpatient followup early next week. - INR in am  Maryanna Shape, PharmD, BCPS  Clinical Pharmacist  Pager: [phone]   04/26/2017,1:44 PM

## 2017-04-26 NOTE — Progress Notes (Signed)
Physical Therapy Discharge Summary  Patient Details  Name: [name] DARROCH, MD MRN: [id number] Date of Birth: [birth year]  Today's Date: 04/26/2017 PT Individual Time: 1110-1206 PT Individual Time Calculation (min): 56 min    Patient has met 9 of 10 long term goals due to improved activity tolerance, improved balance, improved postural control, increased strength and decreased pain.  Patient to discharge at an ambulatory level Supervision.   Patient's care partner is independent to provide the necessary physical assistance at discharge.  Reasons goals not met: Pt was unable to achieve full Mod I with bed mobility as he continues to need assistance to go sitting to supine.   Recommendation:  Patient will benefit from ongoing skilled PT services in home health setting to continue to advance safe functional mobility, address ongoing impairments in strength, ambulation, balance, endurance to minimize fall risk. Per CSW, pt will not have time for PT services due to dialysis training. Pt will continue to coordinate with his physician PT services if/when appropriate.   Equipment: rolling walker, transport chair  Reasons for discharge: treatment goals met  Patient/family agrees with progress made and goals achieved: Yes  Session:   Pt sitting in w/c upon arrival, agreeable to PT session. Discussed with PT and spouse activity progression at home as well as safety considerations. Recommending use of rw for ambulation at this time for safety and supervision for mobility. Pt and spouse verbalize understanding and agreement. During session, pt ambulating with rw and SPC with improved stability using rw. Educated spouse on guarding techniques for mobility. Car transfer performed from height of anticipated vehicle. Following session, pt returned to room with spouse present. PT and spouse deny any questions or concerns.  PT Discharge Precautions/Restrictions Precautions Precautions:  Fall Restrictions Weight Bearing Restrictions: No Pain Pain Assessment Pain Assessment: No/denies pain Vision/Perception  Vision - History Baseline Vision: Wears glasses all the time Patient Visual Report: No change from baseline Vision - Assessment Eye Alignment: Within Functional Limits Perception Perception: Within Functional Limits  Cognition Overall Cognitive Status: Impaired/Different from baseline Arousal/Alertness: Awake/alert Orientation Level: Oriented X4 Awareness: Impaired Comments: Needing intermittent cue for safety and orientation to task.  Sensation Sensation Light Touch: Appears Intact Proprioception: Appears Intact Coordination Gross Motor Movements are Fluid and Coordinated: Yes Fine Motor Movements are Fluid and Coordinated: Yes Motor  Motor Motor: Within Functional Limits Motor - Skilled Clinical Observations: generalized weakness  Motor - Discharge Observations:  (generalized weakness in bilateral LEs. )     Balance Balance Balance Assessed: Yes Static Sitting Balance Static Sitting - Balance Support: No upper extremity supported Static Sitting - Level of Assistance: 7: Independent Dynamic Sitting Balance Dynamic Sitting - Balance Support: Feet supported;No upper extremity supported Dynamic Sitting - Level of Assistance: 7: Independent Dynamic Sitting - Balance Activities: Lateral lean/weight shifting;Forward lean/weight shifting Sitting balance - Comments: able to sit EOB and reach at variable angles without LOB.  Static Standing Balance Static Standing - Balance Support: During functional activity Static Standing - Level of Assistance: 5: Stand by assistance Dynamic Standing Balance Dynamic Standing - Balance Support: During functional activity Dynamic Standing - Level of Assistance: 5: Stand by assistance Extremity Assessment  RUE Strength RUE Overall Strength Comments: generalized weakness LUE Strength LUE Overall Strength Comments:  generalized weakness RLE Strength RLE Overall Strength Comments: hip flexion 4-/5, knee extension 4/5, knee flexion 4-/5,dorsiflexion 4-/5 LLE Strength LLE Overall Strength Comments: hip flexion 4-/5, knee extension 4/5, knee flexion 4-/5, dorsiflexion 4-/5   See Function Navigator  for Current Functional Status.  Linard Millers, PT 04/26/2017, 12:45 PM

## 2017-04-27 LAB — PROTIME-INR
INR: 2.63
Prothrombin Time: 28.6 seconds — ABNORMAL HIGH (ref 11.4–15.2)

## 2017-04-27 LAB — GLUCOSE, CAPILLARY: GLUCOSE-CAPILLARY: 115 mg/dL — AB (ref 65–99)

## 2017-04-27 NOTE — Progress Notes (Signed)
[name] PHYSICAL MEDICINE & REHABILITATION     PROGRESS NOTE  Subjective/Complaints:  Pt seen laying in bed this AM.  [name] slept well overnight.  [name] spontaneously explains to me the process of PD.    ROS: Denies CP, SOB, N/V/D.  Objective: Vital Signs: Blood pressure (!) (P) 129/57, pulse (!) (P) 57, temperature (P) 97.9 F (36.6 C), temperature source (P) Oral, resp. rate 18, height 5\' 11"  (1.803 m), weight 95.2 kg (209 lb 14.1 oz), SpO2 (P) 100 %. No results found.  Recent Labs  04/26/17 0456  WBC 2.8*  HGB 13.4  HCT 40.1  PLT 125*    Recent Labs  04/26/17 0456  NA 136  K 3.0*  CL 98*  GLUCOSE 155*  BUN 39*  CREATININE 2.21*  CALCIUM 8.0*   CBG (last 3)   Recent Labs  04/26/17 1646 04/26/17 2042 04/27/17 0636  GLUCAP 154* 138* 115*    Wt Readings from Last 3 Encounters:  04/25/17 95.2 kg (209 lb 14.1 oz)  04/16/17 99.8 kg (220 lb 0.3 oz)  03/02/17 99.8 kg (220 lb)    Physical Exam:  BP (!) (P) 129/57 (BP Location: Right Arm)   Pulse (!) (P) 57   Temp (P) 97.9 F (36.6 C) (Oral)   Resp 18   Ht 5\' 11"  (1.803 m)   Wt 95.2 kg (209 lb 14.1 oz)   SpO2 (P) 100%   BMI 29.27 kg/m  Constitutional: [name] appears well-developed. NAD. HENT: Normocephalic and atraumatic.  Eyes: EOMI. No discharge.  Cardiovascular: IRR. No JVD. +Murmur. Respiratory: Effort normal and breath sounds normal.  GI: Soft. Bowel sounds are normal. PD catheter noted.  Neurological:  Follows simple commands.  STM deficits.  Motor: 4+/5 throughout (stable) Skin.warm and dry. Dry dressing to LUE.  Some scattered bruising around abdomen.  Psych: Normal mood and affect.  Assessment/Plan: 1. Functional deficits secondary to debility which require 3+ hours per day of interdisciplinary therapy in a comprehensive inpatient rehab setting. Physiatrist is providing close team supervision and 24 hour management of active medical problems listed below. Physiatrist and rehab team continue to  assess barriers to discharge/monitor patient progress toward functional and medical goals.  Function:  Bathing Bathing position   Position: Wheelchair/chair at sink  Bathing parts Body parts bathed by patient: Right arm, Left arm, Chest, Abdomen, Front perineal area, Buttocks, Right upper leg, Left upper leg, Right lower leg, Left lower leg Body parts bathed by helper: Back  Bathing assist Assist Level: Supervision or verbal cues      Upper Body Dressing/Undressing Upper body dressing   What is the patient wearing?: Pull over shirt/dress     Pull over shirt/dress - Perfomed by patient: Thread/unthread right sleeve, Thread/unthread left sleeve, Put head through opening, Pull shirt over trunk Pull over shirt/dress - Perfomed by helper: Pull shirt over trunk        Upper body assist Assist Level: More than reasonable time      Lower Body Dressing/Undressing Lower body dressing   What is the patient wearing?: Underwear, Pants, Shoes Underwear - Performed by patient: Thread/unthread right underwear leg, Thread/unthread left underwear leg Underwear - Performed by helper: Thread/unthread right underwear leg, Thread/unthread left underwear leg, Pull underwear up/down Pants- Performed by patient: Thread/unthread right pants leg, Thread/unthread left pants leg Pants- Performed by helper: Thread/unthread right pants leg, Thread/unthread left pants leg, Pull pants up/down Non-skid slipper socks- Performed by patient: Don/doff right sock, Don/doff left sock Non-skid slipper socks- Performed by  helper: Don/doff right sock, Don/doff left sock Socks - Performed by patient: Don/doff right sock   Shoes - Performed by patient: Don/doff right shoe, Don/doff left shoe Shoes - Performed by helper: Don/doff right shoe, Don/doff left shoe          Lower body assist Assist for lower body dressing: Supervision or verbal cues      Toileting Toileting   Toileting steps completed by patient: Adjust  clothing prior to toileting, Performs perineal hygiene, Adjust clothing after toileting Toileting steps completed by helper: Adjust clothing after toileting Toileting Assistive Devices: Grab bar or rail  Toileting assist Assist level: Supervision or verbal cues   Transfers Chair/bed transfer Chair/bed transfer activity did not occur: Safety/medical concerns (dialysis) Chair/bed transfer method: Ambulatory Chair/bed transfer assist level: Supervision or verbal cues Chair/bed transfer assistive device: Film/video editor lift: Ecologist     Max distance: 180 ft Assist level: Supervision or verbal cues   Wheelchair   Type: Manual Max wheelchair distance: 150 ft Assist Level: Supervision or verbal cues  Cognition Comprehension Comprehension assist level: Understands complex 90% of the time/cues 10% of the time  Expression Expression assist level: Expresses complex 90% of the time/cues < 10% of the time  Social Interaction Social Interaction assist level: Interacts appropriately with others with medication or extra time (anti-anxiety, antidepressant).  Problem Solving Problem solving assist level: Solves complex 90% of the time/cues < 10% of the time  Memory Memory assist level: Recognizes or recalls 90% of the time/requires cueing < 10% of the time    Medical Problem List and Plan:  1. Weakness with cognitive deficits secondary to hepatic encephalopathy and multiple medical issues   D/c today  Will see patient for transitional care management in 1-2 weeks 2. DVT Prophylaxis/Anticoagulation: Chronic Coumadin. Monitor for rebleeding episodes   INR therapeutic 5/4 3. Pain Management: Valium 10 mg daily as needed, Ultram 100 mg every 6 hours as needed  4. Mood: Provide emotional support  5. Neuropsych: This patient is not fully capable of making decisions on his own behalf.  6. Skin/Wound Care: Cleanse wounds to left hand Apply Bactroban Vaseline gauze for atraumatic  dressing removed. Wrap with Kerlix changed daily  7. Fluids/Electrolytes/Nutrition: Routine I&Os 8. ESRD/CPPD. Status post peritoneal dialysis catheter 04/05/2017. Follow-up renal services  9. 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 Monitor for any signs of fluid  Filed Weights   04/24/17 0800 04/25/17 1005 04/25/17 2047  Weight: 92.4 kg (203 lb 11.3 oz) 95 kg (209 lb 7 oz) 95.2 kg (209 lb 14.1 oz)   10. 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 Status post paracentesis 03/06/2017. Continue lactulose.   Ammonia 61 on 5/4  Patient refusing Chronulac, educated, continues to refuse despite encouragement from multiple providers 11. Atrial fibrillation. Continue Coumadin. Cardiac rate control  12. Hypertension. Lopressor 50 mg twice a day, Hytrin 5 mg daily, Demadex 40 mg daily   Monitor with increased mobility  Controlled 5/5  Will need further ambulatory monitoring 13. Anemia: Resolved  Hb 13.4 on 5/4  Cont to monitor 14. History of gout. Allopurinol 100 mg daily. Monitor for any gout flareups  15. DM type 2  Monitor             Amaryl d/ced, pt refusing stating bad reaction in past  Overall controlled 5/5, have discussed with patient, does not believe intervention is necessary/appropriate 16. Thrombocytopenia   Plts 125 on 5/4  Cont to monitor 17. Hypoalbuminemia  Supplement initiated 4/26 18. Obesity  Body mass index is 29.27 kg/m.  Diet and exercise education  Encourage weight loss to increase endurance and promote overall health 19. Hypokalemia  Patient has been refusing K+ supplementation, educated, continues to refuse despite encouragement from multiple providers  LOS (Days) [place] 04/27/2017 8:18 AM

## 2017-04-27 NOTE — Progress Notes (Signed)
04/27/17 1000 nursing Patient discharged to home per wheelchair accompanied by NT and wife. Per wife discharge orders were done yesterday. Wife asked meds given and time of day when coumadin was given. RN read to wife meds given and when time when we give coumadin. No further questions noted.

## 2017-04-29 ENCOUNTER — Telehealth: Payer: Self-pay | Admitting: *Deleted

## 2017-04-29 NOTE — Telephone Encounter (Addendum)
Transitional Care call- 1st attempt 11:15 left message to call office  04/30/17 Mrs Majette called back and left message that he is having peritoneal dialysis this week and next and will be in training for in home dialysis the week of the 21st. I called back and left new message of the appt 05/08/17 and she is to call main number and reschedule to hospital follow up if this date will not work.    Appointment time, Wednesday 05/08/17 @ 12:00 arrive by 11:30 to see Dr  Posey Pronto [address]

## 2017-05-01 ENCOUNTER — Ambulatory Visit (INDEPENDENT_AMBULATORY_CARE_PROVIDER_SITE_OTHER)
Admission: RE | Admit: 2017-05-01 | Discharge: 2017-05-01 | Disposition: A | Discharge status: Home / Self Care | Payer: Medicare Other | Source: Ambulatory Visit | Attending: Vascular Surgery | Admitting: Vascular Surgery

## 2017-05-01 ENCOUNTER — Encounter: Payer: Self-pay | Admitting: Vascular Surgery

## 2017-05-01 ENCOUNTER — Other Ambulatory Visit (HOSPITAL_COMMUNITY): Payer: Medicare Other

## 2017-05-01 ENCOUNTER — Ambulatory Visit (HOSPITAL_COMMUNITY)
Admission: RE | Admit: 2017-05-01 | Discharge: 2017-05-01 | Disposition: A | Discharge status: Home / Self Care | Payer: Medicare Other | Source: Ambulatory Visit | Attending: Vascular Surgery | Admitting: Vascular Surgery

## 2017-05-01 ENCOUNTER — Ambulatory Visit (INDEPENDENT_AMBULATORY_CARE_PROVIDER_SITE_OTHER): Payer: Medicare Other | Admitting: Vascular Surgery

## 2017-05-01 ENCOUNTER — Encounter (HOSPITAL_COMMUNITY): Payer: Medicare Other

## 2017-05-01 ENCOUNTER — Encounter: Payer: Medicare Other | Admitting: Vascular Surgery

## 2017-05-01 VITALS — BP 140/75 | HR 59 | Temp 97.6°F | Resp 20 | Ht 71.0 in | Wt 203.0 lb

## 2017-05-01 DIAGNOSIS — N184 Chronic kidney disease, stage 4 (severe): Secondary | ICD-10-CM | POA: Insufficient documentation

## 2017-05-01 DIAGNOSIS — Z992 Dependence on renal dialysis: Secondary | ICD-10-CM | POA: Diagnosis not present

## 2017-05-01 DIAGNOSIS — N186 End stage renal disease: Secondary | ICD-10-CM

## 2017-05-01 NOTE — Telephone Encounter (Signed)
Appt changed and will become a hospital follow up.

## 2017-05-01 NOTE — Progress Notes (Signed)
Patient ID: [name] Fiddler, MD, male   DOB: 20-Nov-1941, 76 y.o.   MRN: [id number]  Reason for Consult: New Evaluation (eval for AVF - currently uses peritoneal dialysis ref by Dr. 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 He is referred for permanent dialysis access as a backup plan to his peritoneal dialysis access. He does have a history of a pacemaker on the left side and is also had a lymph node dissection on the left side. He is right arm dominant. From a surgical standpoint he has not had any upper extremity surgeries that he has had a CABG. He is currently walking with the aid of a walker that is a temporary help for him. He is referred with need for AV fistula now but to wait on AV grafting as he has current peritoneal access.  Past Medical History:  Diagnosis Date  . A-fib ([place])    permanent with tachy-brady syndrome  . Anemia   . Anxiety   . Atrial flutter ([facility])    hx of  . Biceps tendon tear    right  . Bone marrow disease   . CHF (congestive heart failure) ([place])   . Chronic kidney disease (CKD), stage III (moderate)    lov note dr Koleen Nimrod nephrology 09-17-13 on chart  . Complication of anesthesia 11-16-13   required alot of versed per anesthesia with cataract surgery  . Coronary heart disease    stent, CABG, RBBB  . DDD (degenerative disc disease)   . Diabetes ([place])   . Diabetes mellitus   . Fungal toenail infection   . Gout   . Hemorrhagic cystitis 2012  . Hepatitis    mono  . HTN (hypertension)   . Hypercalcemia 2011   due to sarcoidiosis  . Myocardial infarction [facility])    1995, 2002, 2006  . OSA (obstructive sleep apnea)    use bipap setting of 10 and 12  . Osteoarthritis   . Pneumonia 1966, 2011   hx of  . Presence of permanent cardiac  pacemaker   . Right sciatic nerve pain    for block thursday 01-21-2014, injections  . Sarcoid 2011   pulmonary and bone marrow  . Shortness of breath dyspnea   . Synovial cyst of lumbar spine    l3-l4, l4-l5, injections  . Valvular heart disease    aortic stenosis/regurgitation   Family History  Problem Relation Age of Onset  . Heart attack Father   . Stroke Father   . Stroke Mother   . Allergies    . Asthma    . Heart disease    . Cancer     Past Surgical History:  Procedure Laterality Date  . BASAL CELL CARCINOMA EXCISION Right 2015   ear  . CARDIAC CATHETERIZATION N/A 12/10/2016   Procedure: Right Heart Cath;  Surgeon: Jolaine Artist, MD;  Location: [place] [facility];  Service: Cardiovascular;  Laterality: N/A;  . CARDIAC VALVE REPLACEMENT    . CARDIOVERSION N/A 12/30/2013   Procedure: CARDIOVERSION;  Surgeon: [name], MD;  Location[facility] ENDOSCOPY;  Service: Cardiovascular;  Laterality: N/A;  10:49 cardioversion at 120 joules, then 150 joules, to SB  used  Lido 60m,  Propofol 160 mcg  . CARDIOVERSION  2003, 2006, 2012, 2013  .  cataract surgery Right 2007  . cataract surgery Left 11-16-13  . COLONOSCOPY N/A 01/29/2014   Procedure: COLONOSCOPY;  Surgeon: Winfield Cunas., MD;  Location: [facility] END[facility];  Service: Endoscopy;  Laterality: N/A;  amanda//ja  . CORONARY ARTERY BYPASS GRAFT  2006   x 5  . EP IMPLANTABLE DEVICE N/A 05/05/2015   Procedure: Pacemaker Implant;  Surgeon: Thompson Grayer, MD;  Location: [place] [facility];  Service: Cardiovascular;  Laterality: N/A;  . FLEXIBLE SIGMOIDOSCOPY N/A 12/07/2015   Procedure: FLEXIBLE SIGMOIDOSCOPY;  Surgeon: Wilford Corner, MD;  Location[facility] ENDOSCOPY;  Service: Endoscopy;  Laterality: N/A;  Unprepped  . HERNIA REPAIR    . INSERT / REPLACE / REMOVE PACEMAKER  05/05/2015  . PORTACATH PLACEMENT    . portacath removed    . Redo Median sternotomy, extracorporeal cirulation, AVR, Tricuspid valve repair  06/13/2011    AVR(23-mm Edwards pericardial Magna-Ease valve./ TVrepair (34-mm Edwards MC3 annuloplasty ring  . TOTAL KNEE ARTHROPLASTY Right 05/24/2014   Procedure: RIGHT TOTAL KNEE ARTHROPLASTY;  Surgeon: Gearlean Alf, MD;  Location: [facility] ORS;  Service: Orthopedics;  Laterality: Right;  . TRANSURETHRAL RESECTION OF PROSTATE  oct. 2012   TURP  . VASECTOMY  1977    Short Social History:  Social History  Substance Use Topics  . Smoking status: Former Smoker    Years: 28.00    Types: Pipe, Cigars    Quit date: 12/25/1995  . Smokeless tobacco: Never Used  . Alcohol use No     Comment: 3-5 beer or wine per week    Allergies  Allergen Reactions  . Actos [Pioglitazone Hydrochloride] Swelling and Other (See Comments)    Severe peripheral edema  . Codeine Other (See Comments)    Makes Pt aggressive  . Depakote [Divalproex Sodium] Diarrhea    severe  . Diltiazem Other (See Comments)    lethargy and dyspnea  . Hydralazine Other (See Comments)    Severe chills and SOB   . Isosorbide Dinitrate Other (See Comments)    Severe chills and SOB   . Januvia [Sitagliptin] Diarrhea and Other (See Comments)    bradycardia  . Loop Diuretics Other (See Comments)    Spike in BUN and creatine levels  . Losartan Other (See Comments)    aphasia  . Nsaids Other (See Comments)    Renal problems  . Onglyza [Saxagliptin] Diarrhea and Other (See Comments)    bradycardia  . Orudis [Ketoprofen] Other (See Comments)    Cannot take due to renal insufficiency  . Penicillins Swelling and Other (See Comments)    Serum sickness Has patient had a PCN reaction causing immediate rash, facial/tongue/throat swelling, SOB or lightheadedness with hypotension: Yes Has patient had a PCN reaction causing severe rash involving mucus membranes or skin necrosis: No Has patient had a PCN reaction that required hospitalization Yes Has patient had a PCN reaction occurring within the last 10 years: No If all of the above answers are  "NO", then may proceed with Cephalosporin use.   [name] Potassium Iodide Itching and Rash    Severe entire body itching and rash  . Rozerem [Ramelteon] Diarrhea    Severe   . Amiodarone Other (See Comments)    diarrhea  . Latex Swelling  . Verapamil Other (See Comments)    Severe fatigue  . Benazepril Hcl Other (See Comments)    ? Possible lowers platelets  . Indomethacin Other (See Comments)    Renal problems   . Minocycline Other (See Comments)  Makes dizzy and felt just lousy.  [name] Pentazocine Lactate Other (See Comments)    Unknown allergic reaction - pt and wife do not recall this  . Poison Ivy Extract [Poison Ivy Extract] Rash and Other (See Comments)    blisters  . Sertraline Hcl Other (See Comments)    Pt and wife do not recall this  . Shellfish Allergy Rash    Current Outpatient Prescriptions  Medication Sig Dispense Refill  . acetaminophen (TYLENOL) 500 MG tablet Take 1,000 mg by mouth 2 (two) times daily as needed (pain).     [name] allopurinol (ZYLOPRIM) 100 MG tablet Take 1 tablet (100 mg total) by mouth daily with breakfast. 30 tablet 1  . cetirizine (ZYRTEC) 10 MG tablet Take 10 mg by mouth daily as needed for allergies.     . diphenoxylate-atropine (LOMOTIL) 2.5-0.025 MG per tablet Take 2 tablets by mouth 2 (two) times daily as needed for diarrhea or loose stools. stomach  1  . lactulose (CHRONULAC) 10 GM/15ML solution Take 30 mLs (20 g total) by mouth 2 (two) times daily. 240 mL 0  . metoprolol (LOPRESSOR) 50 MG tablet Take 1 tablet (50 mg total) by mouth 2 (two) times daily. 60 tablet 11  . potassium chloride (KLOR-CON M15) 15 MEQ tablet Take 2 tablets (30 mEq total) by mouth daily. 30 tablet 1  . terazosin (HYTRIN) 5 MG capsule Take 1 capsule (5 mg total) by mouth every morning. 30 capsule 6  . Testosterone Cypionate 200 MG/ML KIT Inject 1 mL into the muscle every 14 (fourteen) days.    [name] torsemide (DEMADEX) 20 MG tablet Take 2 tablets (40 mg total) by mouth every  morning. 30 tablet 1  . traMADol (ULTRAM) 50 MG tablet Take 2 tablets (100 mg total) by mouth every evening. 20 tablet 0  . warfarin (COUMADIN) 1 MG tablet Take 1 tablet (1 mg total) by mouth daily. 30 tablet 1  . zolpidem (AMBIEN) 10 MG tablet Take 1 tablet (10 mg total) by mouth at bedtime as needed for sleep. 20 tablet 0   No current facility-administered medications for this visit.     Review of Systems  Constitutional:  Constitutional negative. HENT: HENT negative.  Respiratory: Respiratory negative.  Cardiovascular: Positive for irregular heartbeat and leg swelling.  GI: Gastrointestinal negative.  Musculoskeletal: Musculoskeletal negative.  Neurological: Positive for dizziness.  Hematologic: Positive for bruises/bleeds easily.  Psychiatric: Psychiatric negative.        Objective:  Objective   Vitals:   05/01/17 0850  BP: 140/75  Pulse: (!) 59  Resp: 20  Temp: 97.6 F (36.4 C)  TempSrc: Oral  SpO2: 97%  Weight: 203 lb (92.1 kg)  Height: _0  (1.803 m)   Body mass index is 28.31 kg/m.  Physical Exam  Constitutional: He is oriented to person, place, and time. He appears well-developed.  Eyes: Pupils are equal, round, and reactive to light.  Neck: Normal range of motion.  Cardiovascular: Normal rate.   1+ right radial, 2+ right brachial artery pulse  Pulmonary/Chest: Effort normal.  Abdominal: Soft.  Musculoskeletal: Normal range of motion. He exhibits no edema.  Neurological: He is alert and oriented to person, place, and time.  Skin: Skin is warm and dry.  Psychiatric: He has a normal mood and affect. His behavior is normal. Judgment and thought content normal.    Data: I have independently reviewed his upper [place] arterial duplexes which demonstrated triphasic waveforms at bilateral brachial arteries on the right 0.45 on  left 0.45 cm.  I have independently eviewed his vein mapping which demonstrates usable basilic veins in his bilateral upper  extremities.     Assessment/Plan:    76 year old male with complex medical history now referred for AV access. Given his previous left upper extremity pacer and lymphadenectomy I would recommend him to have right arm AV fistula which would need to be brachial basilic vein done in 2 stages. I have discussed with him the options of catheters, grafts and fistulas as well as the complications of fistula including primary malfunction, need for further procedures, steal. He demonstrates very good understanding and at this time is not ready to proceed with surgery but would like to discuss further with Dr. [name] and Dr. Haroldine Laws. He can call to schedule right upper extremity brachial basilic fistula and 2 stages or can return to the office with further questions if needed.     Waynetta Sandy MD [facility]

## 2017-05-02 ENCOUNTER — Encounter (HOSPITAL_COMMUNITY): Payer: Medicare Other | Admitting: Internal Medicine

## 2017-05-07 ENCOUNTER — Telehealth: Payer: Self-pay | Admitting: Oncology

## 2017-05-07 ENCOUNTER — Encounter: Payer: Medicare Other | Admitting: Oncology

## 2017-05-07 ENCOUNTER — Ambulatory Visit: Payer: Medicare Other | Admitting: Oncology

## 2017-05-07 NOTE — Telephone Encounter (Signed)
Pt's wife cld to cancel appt for 5/15. She stated that his platelet count was going back up and wouldn't be needing the appt for hematology at this time. She will call our office back if an appt is needed.

## 2017-05-08 ENCOUNTER — Encounter: Payer: Medicare Other | Admitting: Physical Medicine & Rehabilitation

## 2017-05-14 ENCOUNTER — Ambulatory Visit (INDEPENDENT_AMBULATORY_CARE_PROVIDER_SITE_OTHER): Payer: Medicare Other | Admitting: *Deleted

## 2017-05-14 DIAGNOSIS — I495 Sick sinus syndrome: Secondary | ICD-10-CM

## 2017-05-14 NOTE — Progress Notes (Signed)
Remote pacemaker transmission.   

## 2017-05-15 ENCOUNTER — Encounter: Payer: Self-pay | Admitting: Cardiology

## 2017-05-16 LAB — CUP PACEART REMOTE DEVICE CHECK
Battery Remaining Percentage: 95.5 %
Brady Statistic AP VP Percent: 27 %
Brady Statistic AP VS Percent: 6.1 %
Brady Statistic RV Percent Paced: 56 %
Date Time Interrogation Session: 20180522064649
Implantable Lead Implant Date: 20160512
Implantable Lead Location: 753859
Implantable Lead Model: 1948
Implantable Pulse Generator Implant Date: 20160512
Lead Channel Impedance Value: 560 Ohm
Lead Channel Pacing Threshold Amplitude: 1.25 V
Lead Channel Setting Sensing Sensitivity: 2 mV
MDC IDC LEAD IMPLANT DT: 20160512
MDC IDC LEAD LOCATION: 753860
MDC IDC MSMT BATTERY REMAINING LONGEVITY: 127 mo
MDC IDC MSMT BATTERY VOLTAGE: 3.01 V
MDC IDC MSMT LEADCHNL RA IMPEDANCE VALUE: 410 Ohm
MDC IDC MSMT LEADCHNL RA SENSING INTR AMPL: 1.7 mV
MDC IDC MSMT LEADCHNL RV PACING THRESHOLD PULSEWIDTH: 0.5 ms
MDC IDC MSMT LEADCHNL RV SENSING INTR AMPL: 11.8 mV
MDC IDC PG SERIAL: 7759894
MDC IDC SET LEADCHNL RA PACING AMPLITUDE: 2 V
MDC IDC SET LEADCHNL RV PACING AMPLITUDE: 2.5 V
MDC IDC SET LEADCHNL RV PACING PULSEWIDTH: 0.5 ms
MDC IDC STAT BRADY AS VP PERCENT: 43 %
MDC IDC STAT BRADY AS VS PERCENT: 7.4 %
MDC IDC STAT BRADY RA PERCENT PACED: 1 %
Pulse Gen Model: 2240

## 2017-05-29 ENCOUNTER — Encounter: Payer: Self-pay | Admitting: Cardiology

## 2017-05-30 ENCOUNTER — Encounter: Payer: Self-pay | Admitting: Physical Medicine & Rehabilitation

## 2017-05-30 ENCOUNTER — Encounter: Payer: Medicare Other | Attending: Physical Medicine & Rehabilitation | Admitting: Physical Medicine & Rehabilitation

## 2017-05-30 VITALS — BP 133/79 | HR 55 | Resp 14

## 2017-05-30 DIAGNOSIS — Z992 Dependence on renal dialysis: Secondary | ICD-10-CM

## 2017-05-30 DIAGNOSIS — K7290 Hepatic failure, unspecified without coma: Secondary | ICD-10-CM | POA: Diagnosis present

## 2017-05-30 DIAGNOSIS — D696 Thrombocytopenia, unspecified: Secondary | ICD-10-CM

## 2017-05-30 DIAGNOSIS — N186 End stage renal disease: Secondary | ICD-10-CM | POA: Diagnosis not present

## 2017-05-30 DIAGNOSIS — I10 Essential (primary) hypertension: Secondary | ICD-10-CM

## 2017-05-30 DIAGNOSIS — R188 Other ascites: Secondary | ICD-10-CM

## 2017-05-30 DIAGNOSIS — E1169 Type 2 diabetes mellitus with other specified complication: Secondary | ICD-10-CM

## 2017-05-30 DIAGNOSIS — R5381 Other malaise: Secondary | ICD-10-CM

## 2017-05-30 DIAGNOSIS — E669 Obesity, unspecified: Secondary | ICD-10-CM

## 2017-05-30 DIAGNOSIS — I5189 Other ill-defined heart diseases: Secondary | ICD-10-CM

## 2017-05-30 DIAGNOSIS — I519 Heart disease, unspecified: Secondary | ICD-10-CM

## 2017-05-30 NOTE — Progress Notes (Signed)
Subjective:    Patient ID: [name] Fiddler, MD, male    DOB: [birth year], 76 y.o.   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 At discharge, he was instructed to follow up with  Nephro, which he has.  He saw his PCP last week.  He is still on PD.  He has been getting his INR checked. Pain is variable in RLQ. He has an appointment with GI. BP is controlled. Pt is having trouble with PD machine that does not allow for sleep. Denies falls.   DME: None needed Mobility: None needed at present. Therapies: Has had PD issues that have limited availability.   Pain Inventory Average Pain 3 Pain Right Now 3 My pain is intermittent  In the last 24 hours, has pain interfered with the following? General activity 4 Relation with others 3 Enjoyment of life 3 What TIME of day is your pain at its worst? night Sleep (in general) Fair  Pain is worse with: unsure Pain improves with: medication Relief from Meds: 3  Mobility walk without assistance walk with assistance use a cane how many minutes can you walk? 20 ability to climb steps?  yes do you drive?  no Do you have any goals in this area?  yes  Function disabled: date disabled . retired I need assistance with the following:  shopping  Neuro/Psych No problems in this area  Prior Studies hospital f/u  Physicians involved in your care hospital f/u   Family History  Problem Relation Age of Onset  . Heart attack Father   . Stroke Father   . Stroke Mother   . Allergies Unknown   . Asthma Unknown   . Heart  disease Unknown   . Cancer Unknown    Social History   Social History  . Marital status: Married    Spouse name: N/A  . Number of children: 4  . Years of education: N/A   Occupational History  . retired Administrator, [employer]     due to coronary disease in 2000  . 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 Smoking status: Former Smoker    Years: 28.00    Types: Pipe, Cigars    Quit date: 12/25/1995  . Smokeless tobacco: Never Used  . Alcohol use No     Comment: 3-5 beer or wine per week  . Drug use: No  . Sexual activity: Not Asked   Other Topics Concern  . None   Social History Narrative  . None   Past Surgical History:  Procedure Laterality Date  . BASAL CELL CARCINOMA EXCISION Right 2015   ear  . CARDIAC CATHETERIZATION N/A 12/10/2016   Procedure: Right Heart Cath;  Surgeon: Jolaine Artist, MD;  Location: [facility];  Service: Cardiovascular;  Laterality: N/A;  . CARDIAC VALVE REPLACEMENT    . CARDIOVERSION N/A 12/30/2013   Procedure: CARDIOVERSION;  Surgeon: [name], MD;  Location: [facility] ENDOSCOPY;  Service: Cardiovascular;  Laterality: N/A;  10:49 cardioversion at 120 joules, then 150 joules, to SB  used  Lido 30mg ,  Propofol 160 mcg  .  CARDIOVERSION  2003, 2006, 2012, 2013  . cataract surgery Right 2007  . cataract surgery Left 11-16-13  . COLONOSCOPY N/A 01/29/2014   Procedure: COLONOSCOPY;  Surgeon: Winfield Cunas., MD;  Location: [facility];  Service: Endoscopy;  Laterality: N/A;  amanda//ja  . CORONARY ARTERY BYPASS GRAFT  2006   x 5  . EP IMPLANTABLE DEVICE N/A 05/05/2015   Procedure: Pacemaker Implant;  Surgeon: Thompson Grayer, MD;  Location: [facility];  Service: Cardiovascular;  Laterality: N/A;  . FLEXIBLE SIGMOIDOSCOPY N/A 12/07/2015   Procedure: FLEXIBLE SIGMOIDOSCOPY;  Surgeon: Wilford Corner, MD;  Location: [facility] ENDOSCOPY;  Service: Endoscopy;  Laterality: N/A;  Unprepped  . HERNIA REPAIR      . INSERT / REPLACE / REMOVE PACEMAKER  05/05/2015  . PORTACATH PLACEMENT    . portacath removed    . Redo Median sternotomy, extracorporeal cirulation, AVR, Tricuspid valve repair  06/13/2011   AVR(23-mm Edwards pericardial Magna-Ease valve./ TVrepair (34-mm Edwards MC3 annuloplasty ring  . TOTAL KNEE ARTHROPLASTY Right 05/24/2014   Procedure: RIGHT TOTAL KNEE ARTHROPLASTY;  Surgeon: Gearlean Alf, MD;  Location: [facility]S;  Service: Orthopedics;  Laterality: Right;  . TRANSURETHRAL RESECTION OF PROSTATE  oct. 2012   TURP  . VASECTOMY  1977   Past Medical History:  Diagnosis Date  . A-fib ([place])    permanent with tachy-brady syndrome  . Anemia   . Anxiety   . Atrial flutter ([facility])    hx of  . Biceps tendon tear    right  . Bone marrow disease   . CHF (congestive heart failure) ([place])   . Chronic kidney disease (CKD), stage III (moderate)    lov note dr Koleen Nimrod nephrology 09-17-13 on chart  . Complication of anesthesia 11-16-13   required alot of versed per anesthesia with cataract surgery  . Coronary heart disease    stent, CABG, RBBB  . DDD (degenerative disc disease)   . Diabetes ([place])   . Diabetes mellitus   . Fungal toenail infection   . Gout   . Hemorrhagic cystitis 2012  . Hepatitis    mono  . HTN (hypertension)   . Hypercalcemia 2011   due to sarcoidiosis  . Myocardial infarction [facility])    1995, 2002, 2006  . OSA (obstructive sleep apnea)    use bipap setting of 10 and 12  . Osteoarthritis   . Pneumonia 1966, 2011   hx of  . Presence of permanent cardiac pacemaker   . Right sciatic nerve pain    for block thursday 01-21-2014, injections  . Sarcoid 2011   pulmonary and bone marrow  . Shortness of breath dyspnea   . Synovial cyst of lumbar spine    l3-l4, l4-l5, injections  . Valvular heart disease    aortic stenosis/regurgitation   BP 133/79 (BP Location: Right Arm, Patient Position: Sitting, Cuff Size: Normal)   Pulse (!) 55   Resp 14   SpO2 97%    Opioid Risk Score:   Fall Risk Score:  `1  Depression screen PHQ 2/9  Depression screen PHQ 2/9 05/30/2017  Decreased Interest 1  Down, Depressed, Hopeless 1  PHQ - 2 Score 2  Altered sleeping 1  Tired, decreased energy 1  Change in appetite 0  Feeling bad or failure about yourself  1  Trouble concentrating 0  Moving slowly or fidgety/restless 1  Suicidal thoughts 0  PHQ-9 Score 6  Difficult doing work/chores Somewhat difficult  Some recent data might be  hidden    Review of Systems  Constitutional: Negative.   HENT: Negative.   Eyes: Negative.   Respiratory: Positive for apnea.   Cardiovascular: Negative.   Gastrointestinal: Positive for abdominal pain, constipation and diarrhea.  Endocrine:       High blood sugar  Genitourinary: Negative.   Musculoskeletal: Positive for back pain.  Skin: Negative.   Allergic/Immunologic: Negative.   Neurological: Negative.   Hematological: Negative.   Psychiatric/Behavioral: Negative.   All other systems reviewed and are negative.      Objective:   Physical Exam Constitutional: He appears well-developed. NAD. HENT: Normocephalic and atraumatic.  Eyes: EOMI. No discharge.  Cardiovascular: IRIR. No JVD. +Murmur. Respiratory: Effort normal and breath sounds normal.  GI: Soft. Bowel sounds are normal. PD catheter noted.  Neurological:  Follows simple commands.  STM deficits.  Motor: 4+/5 throughout (L>R) Skin.warm and dry. Dry skin Psych: Normal mood and affect.    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 Weakness with cognitive deficits secondary to hepatic encephalopathy and multiple medical issues   Follow up  with GI  Follow up with Nephro  2. DVT Prophylaxis/Anticoagulation:   Cont Coumadin, labs reviewed, INR subtherapeutic, but being adjusted per wife  3. Pain Management  Controlled at present  4. ESRD/CPPD.   Cont PD, issues with machine, which are being addressed  5. Diastolic congestive heart failure   Follow up with HF clinic  6. Hepatic encephalopathy/ascites  Follow up with GI  7. Hypertension  Controlled at present  Cont meds  8. Thrombocytopenia              Plts 100, reviewed recent labs  Meds reviewed Referrals reviewed All questions answered

## 2017-06-03 ENCOUNTER — Other Ambulatory Visit: Payer: Self-pay | Admitting: Internal Medicine

## 2017-06-06 ENCOUNTER — Encounter (HOSPITAL_COMMUNITY): Payer: Self-pay | Admitting: Internal Medicine

## 2017-06-06 ENCOUNTER — Ambulatory Visit (HOSPITAL_COMMUNITY)
Admission: RE | Admit: 2017-06-06 | Discharge: 2017-06-06 | Disposition: A | Discharge status: Home / Self Care | Payer: Medicare Other | Source: Ambulatory Visit | Attending: Internal Medicine | Admitting: Internal Medicine

## 2017-06-06 VITALS — BP 124/76 | HR 60 | Wt 198.7 lb

## 2017-06-06 DIAGNOSIS — Z992 Dependence on renal dialysis: Secondary | ICD-10-CM | POA: Insufficient documentation

## 2017-06-06 DIAGNOSIS — K7469 Other cirrhosis of liver: Secondary | ICD-10-CM | POA: Diagnosis not present

## 2017-06-06 DIAGNOSIS — Z87891 Personal history of nicotine dependence: Secondary | ICD-10-CM | POA: Diagnosis not present

## 2017-06-06 DIAGNOSIS — Z7901 Long term (current) use of anticoagulants: Secondary | ICD-10-CM | POA: Diagnosis not present

## 2017-06-06 DIAGNOSIS — I5032 Chronic diastolic (congestive) heart failure: Secondary | ICD-10-CM | POA: Diagnosis not present

## 2017-06-06 DIAGNOSIS — I5042 Chronic combined systolic (congestive) and diastolic (congestive) heart failure: Secondary | ICD-10-CM | POA: Diagnosis not present

## 2017-06-06 DIAGNOSIS — I482 Chronic atrial fibrillation: Secondary | ICD-10-CM | POA: Diagnosis not present

## 2017-06-06 DIAGNOSIS — E1122 Type 2 diabetes mellitus with diabetic chronic kidney disease: Secondary | ICD-10-CM | POA: Insufficient documentation

## 2017-06-06 DIAGNOSIS — I2510 Atherosclerotic heart disease of native coronary artery without angina pectoris: Secondary | ICD-10-CM | POA: Insufficient documentation

## 2017-06-06 DIAGNOSIS — I4892 Unspecified atrial flutter: Secondary | ICD-10-CM | POA: Diagnosis not present

## 2017-06-06 DIAGNOSIS — D869 Sarcoidosis, unspecified: Secondary | ICD-10-CM | POA: Insufficient documentation

## 2017-06-06 DIAGNOSIS — I4510 Unspecified right bundle-branch block: Secondary | ICD-10-CM | POA: Diagnosis not present

## 2017-06-06 DIAGNOSIS — N184 Chronic kidney disease, stage 4 (severe): Secondary | ICD-10-CM | POA: Diagnosis not present

## 2017-06-06 DIAGNOSIS — E669 Obesity, unspecified: Secondary | ICD-10-CM | POA: Diagnosis not present

## 2017-06-06 DIAGNOSIS — I130 Hypertensive heart and chronic kidney disease with heart failure and stage 1 through stage 4 chronic kidney disease, or unspecified chronic kidney disease: Secondary | ICD-10-CM | POA: Diagnosis not present

## 2017-06-06 DIAGNOSIS — K761 Chronic passive congestion of liver: Secondary | ICD-10-CM | POA: Insufficient documentation

## 2017-06-06 DIAGNOSIS — G4733 Obstructive sleep apnea (adult) (pediatric): Secondary | ICD-10-CM | POA: Diagnosis not present

## 2017-06-06 DIAGNOSIS — F419 Anxiety disorder, unspecified: Secondary | ICD-10-CM | POA: Diagnosis not present

## 2017-06-06 DIAGNOSIS — Z951 Presence of aortocoronary bypass graft: Secondary | ICD-10-CM | POA: Insufficient documentation

## 2017-06-06 DIAGNOSIS — Z953 Presence of xenogenic heart valve: Secondary | ICD-10-CM | POA: Insufficient documentation

## 2017-06-06 DIAGNOSIS — I252 Old myocardial infarction: Secondary | ICD-10-CM | POA: Insufficient documentation

## 2017-06-06 DIAGNOSIS — R188 Other ascites: Secondary | ICD-10-CM | POA: Insufficient documentation

## 2017-06-06 DIAGNOSIS — I5022 Chronic systolic (congestive) heart failure: Secondary | ICD-10-CM | POA: Diagnosis not present

## 2017-06-06 DIAGNOSIS — Z950 Presence of cardiac pacemaker: Secondary | ICD-10-CM | POA: Diagnosis not present

## 2017-06-06 DIAGNOSIS — M1990 Unspecified osteoarthritis, unspecified site: Secondary | ICD-10-CM | POA: Diagnosis not present

## 2017-06-06 DIAGNOSIS — M109 Gout, unspecified: Secondary | ICD-10-CM | POA: Insufficient documentation

## 2017-06-06 DIAGNOSIS — Z952 Presence of prosthetic heart valve: Secondary | ICD-10-CM

## 2017-06-06 DIAGNOSIS — E7220 Disorder of urea cycle metabolism, unspecified: Secondary | ICD-10-CM | POA: Diagnosis not present

## 2017-06-06 LAB — AMMONIA: Ammonia: 80 umol/L — ABNORMAL HIGH (ref 9–35)

## 2017-06-06 NOTE — Progress Notes (Signed)
Patient ID: [name] Fiddler, [name], male   DOB: Jan 29, 1941, 76 y.o.   MRN: [id number]    Advanced Heart Failure Clinic Note   Referring Physician: Dr. Acie Fredrickson PCP: [name] Martinique, [name] Cardiologist: Mertie Moores, [name] , previous [facility] patient  Nephrologist: Dr. Arty Baumgartner.  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   We saw him in the HF Clinic for the first time in July 2017. Markedly volume overloaded with R>>L heart failure and ascites. Underwent 3L paracentesis.   Admitted 04/08/17-04/16/17 with hepatic encephalopathy, ammonia 97, started on lactulose. He also was started on peritoneal dialysis.   He returns today for HF follow up. He feels tired today, says he feels tired most days. He saw Dr. Arty Baumgartner last week and his ammonia level was 101. He does PD nightly. He is oriented, says that he is able to preform his ADL's. His wife who is with him says that he has not been confused. He does get SOB with walking short distances, says that he got SOB with walking back to clinic. Weights stable, he is taking all his medications. Denies chest pain, palpitations, orthopnea and PND.    Studies:  Echo 09/07/15 LVEF 50-55%, RV mildly dilated, bioprosthetic AV and s/p tricuspid valve repair  Echo 12/04/15 LVEF 35-40%, mild MR, mild LAE, RV mildly dilated and moderately reduced, Mod RAE, PA peak pressure 39 mm Hg, bioprosthetic AV and s/p tricuspid valve repair  Echo 7/17 LVEF 40-45% RV severely HK. RVSP 53m HG bioprosthetic AV and s/p tricuspid valve repair  CT abdomen pelvis  12/06/15 with heterogenous hepatic steatosis and advanced atherosclerosis, status post median sternotomy.  RVinton12/17 (volume overloaded at time)  RA = 18 RV = 79/17 PA = 80/23 (46) PCW = 31 (v waves to 40) Fick cardiac output/index = 4.9/2.2 PVR = 3.0 WU FA sat = 98% PA sat = 70%, 71% High SVC sat = 70%    Past Medical History:  Diagnosis Date  . A-fib ([place]    permanent with tachy-brady syndrome  . Anemia   . Anxiety   . Atrial flutter ([facility])    hx of  . Biceps tendon tear    right  . Bone marrow disease   . CHF (congestive heart failure) ([place]   . Chronic kidney disease (CKD), stage III (moderate)    lov note dr cKoleen Nimrodnephrology 09-17-13 on chart  . Complication of anesthesia 11-16-13   required alot of versed per anesthesia with cataract surgery  . Coronary heart disease    stent, CABG, RBBB  . DDD (degenerative disc disease)   . Diabetes ([place]   . Diabetes mellitus   . Fungal toenail infection   . Gout   . Hemorrhagic cystitis 2012  . Hepatitis    mono  . HTN (hypertension)   . Hypercalcemia 2011   due to sarcoidiosis  . Myocardial infarction ([facility]    1995, 2002, 2006  . OSA (obstructive sleep apnea)    use bipap setting of 10 and 12  . Osteoarthritis   . Pneumonia 1966, 2011   hx of  . Presence of permanent cardiac pacemaker   .  Right sciatic nerve pain    for block thursday 01-21-2014, injections  . Sarcoid 2011   pulmonary and bone marrow  . Shortness of breath dyspnea   . Synovial cyst of lumbar spine    l3-l4, l4-l5, injections  . Valvular heart disease    aortic stenosis/regurgitation    Current Outpatient Prescriptions  Medication Sig Dispense Refill  . acetaminophen (TYLENOL) 500 MG tablet Take 1,000 mg by mouth 2 (two) times daily as needed (pain).     [name] allopurinol (ZYLOPRIM) 100 MG tablet Take 1 tablet (100 mg total) by mouth daily with breakfast. (Patient taking differently: Take 200 mg by mouth daily with breakfast. ) 30 tablet 1    . cetirizine (ZYRTEC) 10 MG tablet Take 10 mg by mouth daily as needed for allergies.     . diazepam (VALIUM) 10 MG tablet Take 10 mg by mouth as needed for anxiety.    [name] lactulose (CHRONULAC) 10 GM/15ML solution Take 30 mLs (20 g total) by mouth 2 (two) times daily. 240 mL 0  . metoprolol (LOPRESSOR) 50 MG tablet Take 1 tablet (50 mg total) by mouth 2 (two) times daily. 60 tablet 11  . terazosin (HYTRIN) 5 MG capsule Take 1 capsule (5 mg total) by mouth every morning. 30 capsule 6  . Testosterone Cypionate 200 MG/ML KIT Inject 1 mL into the muscle every 14 (fourteen) days.    [name] torsemide (DEMADEX) 20 MG tablet Take 2 tablets (40 mg total) by mouth every morning. 30 tablet 1  . traMADol (ULTRAM) 50 MG tablet Take 2 tablets (100 mg total) by mouth every evening. 20 tablet 0  . warfarin (COUMADIN) 1 MG tablet Take 1 tablet (1 mg total) by mouth daily. (Patient taking differently: Take 1 mg by mouth daily. 2 mg mon, tues, wed, fri and 1 mg sun, thurs, sat) 30 tablet 1  . zolpidem (AMBIEN) 10 MG tablet Take 1 tablet (10 mg total) by mouth at bedtime as needed for sleep. 20 tablet 0   No current facility-administered medications for this encounter.     Allergies  Allergen Reactions  . Actos [Pioglitazone Hydrochloride] Swelling and Other (See Comments)    Severe peripheral edema  . Codeine Other (See Comments)    Makes Pt aggressive  . Depakote [Divalproex Sodium] Diarrhea    severe  . Diltiazem Other (See Comments)    lethargy and dyspnea  . Hydralazine Other (See Comments)    Severe chills and SOB   . Isosorbide Dinitrate Other (See Comments)    Severe chills and SOB   . Januvia [Sitagliptin] Diarrhea and Other (See Comments)    bradycardia  . Loop Diuretics Other (See Comments)    Spike in BUN and creatine levels  . Losartan Other (See Comments)    aphasia  . Nsaids Other (See Comments)    Renal problems  . Onglyza [Saxagliptin] Diarrhea and Other (See Comments)    bradycardia   . Orudis [Ketoprofen] Other (See Comments)    Cannot take due to renal insufficiency  . Penicillins Swelling and Other (See Comments)    Serum sickness Has patient had a PCN reaction causing immediate rash, facial/tongue/throat swelling, SOB or lightheadedness with hypotension: Yes Has patient had a PCN reaction causing severe rash involving mucus membranes or skin necrosis: No Has patient had a PCN reaction that required hospitalization Yes Has patient had a PCN reaction occurring within the last 10 years: No If all of the above answers are "NO", then may  proceed with Cephalosporin use.   [name] Potassium Iodide Itching and Rash    Severe entire body itching and rash  . Rozerem [Ramelteon] Diarrhea    Severe   . Amiodarone Other (See Comments)    diarrhea  . Latex Swelling  . Verapamil Other (See Comments)    Severe fatigue  . Benazepril Hcl Other (See Comments)    ? Possible lowers platelets  . Indomethacin Other (See Comments)    Renal problems   . Minocycline Other (See Comments)    Makes dizzy and felt just lousy.  [name] Pentazocine Lactate Other (See Comments)    Unknown allergic reaction - pt and wife do not recall this  . Poison Ivy Extract [Poison Ivy Extract] Rash and Other (See Comments)    blisters  . Sertraline Hcl Other (See Comments)    Pt and wife do not recall this  . Shellfish Allergy Rash      Social History   Social History  . Marital status: Married    Spouse name: N/A  . Number of children: 4  . Years of education: N/A   Occupational History  . retired Administrator, [employer]     due to coronary disease in 2000  . 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 Smoking status: Former Smoker    Years: 28.00    Types: Pipe, Cigars    Quit date: 12/25/1995  . Smokeless tobacco: Never Used  . Alcohol use No     Comment: 3-5 beer or wine per week  . Drug use: No  . Sexual activity: Not on file   Other Topics Concern  . Not  on file   Social History Narrative  . No narrative on file      Family History  Problem Relation Age of Onset  . Heart attack Father   . Stroke Father   . Stroke Mother   . Allergies Unknown   . Asthma Unknown   . Heart disease Unknown   . Cancer Unknown     Vitals:   06/06/17 1446  BP: 124/76  Pulse: 60  SpO2: 96%  Weight: 198 lb 11.2 oz (90.1 kg)   Wt Readings from Last 3 Encounters:  06/06/17 198 lb 11.2 oz (90.1 kg)  05/01/17 203 lb (92.1 kg)  04/25/17 209 lb 14.1 oz (95.2 kg)    PHYSICAL EXAM: General:  Ill appearing male, NAD.  HEENT: Normal, atraumatic.  Neck: supple. JVP 7 cm. Carotids 2+ bilat; no bruits. No lymphadenopathy or thyromegaly appreciated. Cor: PMI nondisplaced.Irregularly irregular. 2/6 TR murmur.  Lungs: Clear bilaterally, normal effort Abdomen: Obese, non-tender, +distended.  No bruits or masses. + bowel sounds Extremities: no cyanosis, clubbing, rash. No edema.  Neuro: alert & oriented x 3, cranial nerves grossly intact. moves all 4 extremities w/o difficulty. Affect pleasant.   ASSESSMENT & PLAN: 1. Chronic combined CHF with R >> L symptoms : Echo 7/17 LVEF 40-45% RV severely HK. RVSP 60m HG bioprosthetic AV and s/p tricuspid valve repair --NYHA III --Volume status stable.  --Continue torsemide 424mdaily --Continue metoprolol 5040mID 2. CAD status post CABG 2006 --Cath in 2012 with all grafts patent but severe disease in native vessels in OM and LAD territories - Denies chest pain - No ASA with warfarin 3. Aortic stenosis and tricuspid regurgitation status post bioprosthetic AVR 2012 and tricuspid valve repair in 05/2011 - Stable.  4. Chronic atrial fibrillation  - On warfarin - Rate  controlled.  5. CKD Stage IV  - Started PD in April 2018, follows with Dr. Arty Baumgartner 6. DM 2  7. Anxiety  8. Cardiac cirrhosis with ascites - Ammonia level elevated despite lactulose. - Ammonia level today per family request. - He wishes to hold  off on adding Xifaxin and just use lactulose for now.  - Will order an abdominal CT to evaluate cirrhosis further.  - Follows with Dr. Oletta Lamas.  9. OHS/OSA: - Wears CPAP at night.   Follow up in 4 months.   Arbutus Leas, NP-C 2:54 PM  Patient seen and examined with Jettie Booze, NP. We discussed all aspects of the encounter. I agree with the assessment and plan as stated above.   Volume status much improved with PD. Ammonia level has been persistently high (> 100) but no signs of encephalopathy on exam. 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 Will check CT ab to assess liver parenchyma. Lactulose dose limited by need to go out of the house for appts and other engagements. He prefers to defer starting Xafaxin for now. Ammonia today down to 80. Will continue to follow.   Remains in rate-controlled AF. Tolerating warfarin well. CAD and AVR stable. Repeat echo next visit.   D/w Dr. Myrtie Hawk.   Glori Bickers, [name]  8:56 PM

## 2017-06-06 NOTE — Patient Instructions (Signed)
Lab today  CT of your abdomin   We will contact you in 4 months to schedule your next appointment.

## 2017-06-10 ENCOUNTER — Telehealth (HOSPITAL_COMMUNITY): Payer: Self-pay

## 2017-06-10 NOTE — Telephone Encounter (Signed)
Patient left VM asking about ammonia results from labs drawn last with at Dr. Clayborne Dana [facility]. Attempted to call back, wife answered but could not hear me. Will try to call back later today.  Renee Pain, RN

## 2017-06-12 ENCOUNTER — Ambulatory Visit (HOSPITAL_COMMUNITY)
Admission: RE | Admit: 2017-06-12 | Discharge: 2017-06-12 | Disposition: A | Discharge status: Home / Self Care | Payer: Medicare Other | Source: Ambulatory Visit | Attending: Internal Medicine | Admitting: Internal Medicine

## 2017-06-12 DIAGNOSIS — K8020 Calculus of gallbladder without cholecystitis without obstruction: Secondary | ICD-10-CM | POA: Diagnosis not present

## 2017-06-12 DIAGNOSIS — I2510 Atherosclerotic heart disease of native coronary artery without angina pectoris: Secondary | ICD-10-CM | POA: Diagnosis not present

## 2017-06-12 DIAGNOSIS — K7469 Other cirrhosis of liver: Secondary | ICD-10-CM | POA: Diagnosis present

## 2017-06-12 DIAGNOSIS — K7460 Unspecified cirrhosis of liver: Secondary | ICD-10-CM | POA: Insufficient documentation

## 2017-06-12 DIAGNOSIS — I517 Cardiomegaly: Secondary | ICD-10-CM | POA: Diagnosis not present

## 2017-06-12 DIAGNOSIS — I700 Atherosclerosis of aorta: Secondary | ICD-10-CM | POA: Insufficient documentation

## 2017-06-25 ENCOUNTER — Other Ambulatory Visit: Payer: Self-pay | Admitting: Internal Medicine

## 2017-06-25 ENCOUNTER — Other Ambulatory Visit (HOSPITAL_COMMUNITY): Payer: Self-pay | Admitting: Internal Medicine

## 2017-06-27 ENCOUNTER — Other Ambulatory Visit (HOSPITAL_COMMUNITY): Payer: Self-pay | Admitting: Internal Medicine

## 2017-07-01 ENCOUNTER — Other Ambulatory Visit: Payer: Self-pay | Admitting: Internal Medicine

## 2017-08-01 ENCOUNTER — Encounter: Payer: Self-pay | Admitting: Physical Medicine & Rehabilitation

## 2017-08-01 ENCOUNTER — Encounter: Payer: Medicare Other | Attending: Physical Medicine & Rehabilitation | Admitting: Physical Medicine & Rehabilitation

## 2017-08-01 VITALS — BP 175/77 | HR 53

## 2017-08-01 DIAGNOSIS — R5381 Other malaise: Secondary | ICD-10-CM | POA: Diagnosis not present

## 2017-08-01 DIAGNOSIS — E669 Obesity, unspecified: Secondary | ICD-10-CM | POA: Diagnosis not present

## 2017-08-01 DIAGNOSIS — K7290 Hepatic failure, unspecified without coma: Secondary | ICD-10-CM | POA: Diagnosis present

## 2017-08-01 DIAGNOSIS — I10 Essential (primary) hypertension: Secondary | ICD-10-CM

## 2017-08-01 DIAGNOSIS — E1169 Type 2 diabetes mellitus with other specified complication: Secondary | ICD-10-CM | POA: Diagnosis not present

## 2017-08-01 DIAGNOSIS — I5189 Other ill-defined heart diseases: Secondary | ICD-10-CM

## 2017-08-01 DIAGNOSIS — I2510 Atherosclerotic heart disease of native coronary artery without angina pectoris: Secondary | ICD-10-CM

## 2017-08-01 DIAGNOSIS — I519 Heart disease, unspecified: Secondary | ICD-10-CM | POA: Diagnosis not present

## 2017-08-01 DIAGNOSIS — Z992 Dependence on renal dialysis: Secondary | ICD-10-CM | POA: Diagnosis not present

## 2017-08-01 NOTE — Progress Notes (Signed)
Subjective:    Patient ID: [name] Fiddler, MD, male    DOB: March 31, 1941, 76 y.o.   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   Last clinic visit 05/30/17.  Since that time, he continues to follow up with GI and Nephro.  His PCP his checking his INR.  Denies pain.  He had fixed his PD machine, but it broke again this AM.  He continues to go to HF clinic.  His blood pressure is elevated, but pt states it is normally better controlled.  Denies falls. Doing HEP.    Pain Inventory Average Pain 0 Pain Right Now 0 My pain is intermittent and na  In the last 24 hours, has pain interfered with the following? General activity 2 Relation with others 0 Enjoyment of life 0 What TIME of day is your pain at its worst? night Sleep (in general) Fair  Pain is worse with: unsure Pain improves with: medication Relief from Meds: 3  Mobility walk without assistance walk with assistance use a cane how many minutes can you walk? 20 ability to climb steps?  yes do you drive?  no Do you have any goals in this area?  yes  Function disabled: date disabled . retired I need assistance with the following:  shopping  Neuro/Psych No problems in this area  Prior Studies hospital f/u  Physicians involved in your care hospital f/u   Family History  Problem Relation Age of Onset  . Heart attack Father   . Stroke Father   . Stroke Mother   . Allergies Unknown   . Asthma Unknown   . Heart disease Unknown   . Cancer Unknown    Social History   Social History  . Marital status: Married    Spouse name: N/A  . Number of children: 4  . Years of education: N/A   Occupational History  .  retired Administrator, [employer]     due to coronary disease in 2000  . 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 Smoking status: Former Smoker    Years: 28.00    Types: Pipe, Cigars    Quit date: 12/25/1995  . Smokeless tobacco: Never Used  . Alcohol use No     Comment: 3-5 beer or wine per week  . Drug use: No  . Sexual activity: Not Asked   Other Topics Concern  . None   Social History Narrative  . None   Past Surgical History:  Procedure Laterality Date  . BASAL CELL CARCINOMA EXCISION Right 2015   ear  . CARDIAC CATHETERIZATION N/A 12/10/2016   Procedure: Right Heart Cath;  Surgeon: Jolaine Artist, MD;  Location: [place] [facility];  Service: Cardiovascular;  Laterality: N/A;  . CARDIAC VALVE REPLACEMENT    . CARDIOVERSION N/A 12/30/2013   Procedure: CARDIOVERSION;  Surgeon: [name], MD;  Location: [facility] ENDOSCOPY;  Service: Cardiovascular;  Laterality: N/A;  10:49 cardioversion at 120 joules, then 150 joules, to SB  used  Lido 30mg ,  Propofol 160 mcg  . CARDIOVERSION  2003, 2006, 2012, 2013  . cataract surgery Right 2007  . cataract surgery Left 11-16-13  . COLONOSCOPY N/A 01/29/2014   Procedure: COLONOSCOPY;  Surgeon: Jeneen Rinks  Angeline Slim., MD;  Location: [facility];  Service: Endoscopy;  Laterality: N/A;  amanda//ja  . CORONARY ARTERY BYPASS GRAFT  2006   x 5  . EP IMPLANTABLE DEVICE N/A 05/05/2015   Procedure: Pacemaker Implant;  Surgeon: Thompson Grayer, MD;  Location: [place] [facility];  Service: Cardiovascular;  Laterality: N/A;  . FLEXIBLE SIGMOIDOSCOPY N/A 12/07/2015   Procedure: FLEXIBLE SIGMOIDOSCOPY;  Surgeon: Wilford Corner, MD;  Location[facility] ENDOSCOPY;  Service: Endoscopy;  Laterality: N/A;  Unprepped  . HERNIA REPAIR    . INSERT / REPLACE / REMOVE PACEMAKER  05/05/2015  . PORTACATH PLACEMENT    . portacath removed    . Redo Median sternotomy, extracorporeal cirulation, AVR, Tricuspid valve repair  06/13/2011   AVR(23-mm  Edwards pericardial Magna-Ease valve./ TVrepair (34-mm Edwards MC3 annuloplasty ring  . TOTAL KNEE ARTHROPLASTY Right 05/24/2014   Procedure: RIGHT TOTAL KNEE ARTHROPLASTY;  Surgeon: Gearlean Alf, MD;  Location: [facility];  Service: Orthopedics;  Laterality: Right;  . TRANSURETHRAL RESECTION OF PROSTATE  oct. 2012   TURP  . VASECTOMY  1977   Past Medical History:  Diagnosis Date  . A-fib ([place])    permanent with tachy-brady syndrome  . Anemia   . Anxiety   . Atrial flutter (HCC)    hx of  . Biceps tendon tear    right  . Bone marrow disease   . CHF (congestive heart failure) ([place])   . Chronic kidney disease (CKD), stage III (moderate)    lov note dr Koleen Nimrod nephrology 09-17-13 on chart  . Complication of anesthesia 11-16-13   required alot of versed per anesthesia with cataract surgery  . Coronary heart disease    stent, CABG, RBBB  . DDD (degenerative disc disease)   . Diabetes ([place])   . Diabetes mellitus   . Fungal toenail infection   . Gout   . Hemorrhagic cystitis 2012  . Hepatitis    mono  . HTN (hypertension)   . Hypercalcemia 2011   due to sarcoidiosis  . Myocardial infarction [facility])    1995, 2002, 2006  . OSA (obstructive sleep apnea)    use bipap setting of 10 and 12  . Osteoarthritis   . Pneumonia 1966, 2011   hx of  . Presence of permanent cardiac pacemaker   . Right sciatic nerve pain    for block thursday 01-21-2014, injections  . Sarcoid 2011   pulmonary and bone marrow  . Shortness of breath dyspnea   . Synovial cyst of lumbar spine    l3-l4, l4-l5, injections  . Valvular heart disease    aortic stenosis/regurgitation   BP (!) 175/77   Pulse (!) 53   SpO2 97%   Opioid Risk Score:   Fall Risk Score:  `1  Depression screen PHQ 2/9  Depression screen PHQ 2/9 05/30/2017  Decreased Interest 1  Down, Depressed, Hopeless 1  PHQ - 2 Score 2  Altered sleeping 1  Tired, decreased energy 1  Change in appetite 0  Feeling bad or failure about  yourself  1  Trouble concentrating 0  Moving slowly or fidgety/restless 1  Suicidal thoughts 0  PHQ-9 Score 6  Difficult doing work/chores Somewhat difficult  Some recent data might be hidden    Review of Systems  Constitutional: Positive for unexpected weight change.  HENT: Negative.   Eyes: Negative.   Respiratory: Positive for apnea.   Cardiovascular: Negative.   Gastrointestinal: Negative.   Endocrine:  High blood sugar  Genitourinary: Negative.   Musculoskeletal: Positive for joint swelling.  Skin: Negative.   Allergic/Immunologic: Negative.   Neurological: Negative.   Hematological: Negative.   Psychiatric/Behavioral: Negative.   All other systems reviewed and are negative.      Objective:   Physical Exam Constitutional: He appears well-developed. NAD. HENT: Normocephalic and atraumatic.  Eyes: EOMI. No discharge.  Cardiovascular: IRIR. No JVD. +Murmur. Respiratory: Effort normal and breath sounds normal.  GI: Soft. Bowel sounds are normal.  Neurological:  Alert and oriented. Motor: 4+/5 RUE/RLE 5/5 LUE/LLE Good attention/concentration with short term memory recall Skin: warm and dry. Dry skin Psych: Normal mood and affect.    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   1. Weakness with cognitive deficits secondary to hepatic encephalopathy and multiple medical issues   Cont follow up with GI  Cont follow up with Nephro  2. DVT Prophylaxis/Anticoagulation:   Cont Coumadin per PCP  3. Pain Management  Controlled at present  4. ESRD/CPPD.   Cont PD, issues with machine, new machine to arrive tomorrow  5. Diastolic congestive heart failure   Follow  up with HF clinic  6. Hepatic encephalopathy/ascites  Follow up with GI  7. Hypertension  Elevated today in office, however, better control at home per pt  Cont meds

## 2017-08-13 ENCOUNTER — Ambulatory Visit (INDEPENDENT_AMBULATORY_CARE_PROVIDER_SITE_OTHER): Payer: Medicare Other | Admitting: *Deleted

## 2017-08-13 DIAGNOSIS — I495 Sick sinus syndrome: Secondary | ICD-10-CM

## 2017-08-14 NOTE — Progress Notes (Signed)
Remote pacemaker transmission.   

## 2017-08-21 LAB — CUP PACEART REMOTE DEVICE CHECK
Battery Remaining Percentage: 95.5 %
Battery Voltage: 2.99 V
Brady Statistic AP VP Percent: 44 %
Brady Statistic AS VS Percent: 3.5 %
Brady Statistic RA Percent Paced: 1 %
Brady Statistic RV Percent Paced: 58 %
Date Time Interrogation Session: 20180821063049
Implantable Lead Location: 753859
Implantable Pulse Generator Implant Date: 20160512
Lead Channel Impedance Value: 440 Ohm
Lead Channel Impedance Value: 590 Ohm
Lead Channel Pacing Threshold Pulse Width: 0.5 ms
Lead Channel Sensing Intrinsic Amplitude: 0.5 mV
MDC IDC LEAD IMPLANT DT: 20160512
MDC IDC LEAD IMPLANT DT: 20160512
MDC IDC LEAD LOCATION: 753860
MDC IDC MSMT BATTERY REMAINING LONGEVITY: 127 mo
MDC IDC MSMT LEADCHNL RV PACING THRESHOLD AMPLITUDE: 1.25 V
MDC IDC MSMT LEADCHNL RV SENSING INTR AMPL: 8.3 mV
MDC IDC PG SERIAL: 7759894
MDC IDC SET LEADCHNL RA PACING AMPLITUDE: 2 V
MDC IDC SET LEADCHNL RV PACING AMPLITUDE: 2.5 V
MDC IDC SET LEADCHNL RV PACING PULSEWIDTH: 0.5 ms
MDC IDC SET LEADCHNL RV SENSING SENSITIVITY: 2 mV
MDC IDC STAT BRADY AP VS PERCENT: 12 %
MDC IDC STAT BRADY AS VP PERCENT: 29 %
Pulse Gen Model: 2240

## 2017-08-23 ENCOUNTER — Encounter: Payer: Self-pay | Admitting: Cardiology

## 2017-09-21 ENCOUNTER — Other Ambulatory Visit: Payer: Self-pay | Admitting: Internal Medicine

## 2017-09-25 ENCOUNTER — Encounter: Payer: Self-pay | Admitting: Cardiovascular Disease

## 2017-09-25 ENCOUNTER — Encounter: Payer: Self-pay | Admitting: Internal Medicine

## 2017-09-25 ENCOUNTER — Ambulatory Visit (INDEPENDENT_AMBULATORY_CARE_PROVIDER_SITE_OTHER): Payer: Medicare Other | Admitting: Cardiovascular Disease

## 2017-09-25 ENCOUNTER — Ambulatory Visit (INDEPENDENT_AMBULATORY_CARE_PROVIDER_SITE_OTHER): Payer: Medicare Other | Admitting: Internal Medicine

## 2017-09-25 VITALS — BP 118/70 | HR 66 | Ht 70.0 in | Wt 215.8 lb

## 2017-09-25 VITALS — BP 116/70 | HR 67 | Ht 71.0 in | Wt 226.8 lb

## 2017-09-25 DIAGNOSIS — I2510 Atherosclerotic heart disease of native coronary artery without angina pectoris: Secondary | ICD-10-CM

## 2017-09-25 DIAGNOSIS — I495 Sick sinus syndrome: Secondary | ICD-10-CM | POA: Diagnosis not present

## 2017-09-25 DIAGNOSIS — R001 Bradycardia, unspecified: Secondary | ICD-10-CM | POA: Diagnosis not present

## 2017-09-25 DIAGNOSIS — I482 Chronic atrial fibrillation, unspecified: Secondary | ICD-10-CM

## 2017-09-25 DIAGNOSIS — I5043 Acute on chronic combined systolic (congestive) and diastolic (congestive) heart failure: Secondary | ICD-10-CM | POA: Diagnosis not present

## 2017-09-25 DIAGNOSIS — Z959 Presence of cardiac and vascular implant and graft, unspecified: Secondary | ICD-10-CM

## 2017-09-25 DIAGNOSIS — Z952 Presence of prosthetic heart valve: Secondary | ICD-10-CM | POA: Diagnosis not present

## 2017-09-25 NOTE — Patient Instructions (Signed)
Medication Instructions:  Your physician recommends that you continue on your current medications as directed. Please refer to the Current Medication list given to you today.     Labwork: None ordered   Testing/Procedures: None ordered   Follow-Up: Remote monitoring is used to monitor your Pacemaker  from home. This monitoring reduces the number of office visits required to check your device to one time per year. It allows [place] to keep an eye on the functioning of your device to ensure it is working properly. You are scheduled for a device check from home on 11/12/17. You may send your transmission at any time that day. If you have a wireless device, the transmission will be sent automatically. After your physician reviews your transmission, you will receive a postcard with your next transmission date.    Your physician wants you to follow-up in: 12 months with Dr Vallery Ridge will receive a reminder letter in the mail two months in advance. If you don't receive a letter, please call our office to schedule the follow-up appointment.   Any Other Special Instructions Will Be Listed Below (If Applicable).     If you need a refill on your cardiac medications before your next appointment, please call your pharmacy.

## 2017-09-25 NOTE — Progress Notes (Signed)
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  Since last being seen in our clinic, the patient reports doing reasonably well.  He has started peritoneal dialysis after presenting with elevated ammonia/ AMS.   Today, he denies symptoms of palpitations, chest pain, shortness of breath,  lower extremity edema, dizziness, presyncope, or syncope.  The patient is otherwise without complaint today.   Past Medical History:  Diagnosis Date  . A-fib ([place])    permanent with tachy-brady syndrome  . Anemia   . Anxiety   . Atrial flutter (HCC)    hx of  . Biceps tendon tear    right  . Bone marrow disease   . CHF (congestive heart failure) ([place])   . Chronic kidney disease (CKD), stage V (HCC)      colodonato, Peritoneal dialysis  . Complication of anesthesia 11-16-13   required alot of versed per anesthesia with cataract surgery  . Coronary heart disease    stent, CABG, RBBB  . DDD (degenerative disc disease)   . Diabetes ([place])   . Diabetes mellitus   . Fungal toenail infection   . Gout   . Hemorrhagic cystitis 2012  . Hepatitis    mono  . HTN (hypertension)   . Hypercalcemia 2011   due to sarcoidiosis  . Myocardial infarction [facility])    1995, 2002, 2006  . OSA (obstructive sleep apnea)    use bipap setting of 10 and 12  . Osteoarthritis   . Pneumonia 1966, 2011   hx of  . Presence of permanent cardiac pacemaker   . Right sciatic nerve pain    for block thursday 01-21-2014, injections  . Sarcoid 2011   pulmonary and bone marrow  . Shortness of breath dyspnea   . Synovial cyst of lumbar spine    l3-l4, l4-l5, injections  . Valvular heart disease    aortic stenosis/regurgitation   Past Surgical History:  Procedure Laterality Date  . BASAL CELL CARCINOMA EXCISION Right 2015   ear  . CARDIAC CATHETERIZATION N/A  12/10/2016   Procedure: Right Heart Cath;  Surgeon: Jolaine Artist, MD;  Location: [place] [facility] LAB;  Service: Cardiovascular;  Laterality: N/A;  . CARDIAC VALVE REPLACEMENT    . CARDIOVERSION N/A 12/30/2013   Procedure: CARDIOVERSION;  Surgeon: [name], MD;  Location: [facility] ENDOSCOPY;  Service: Cardiovascular;  Laterality: N/A;  10:49 cardioversion at 120 joules, then 150 joules, to SB  used  Lido 27m,  Propofol 160 mcg  . CARDIOVERSION  2003, 2006, 2012, 2013  . cataract surgery Right 2007  . cataract surgery Left 11-16-13  . COLONOSCOPY N/A 01/29/2014   Procedure: COLONOSCOPY;  Surgeon: JWinfield Cunas, MD;  [facility];  Service: Endoscopy;  Laterality: N/A;  amanda//ja  . CORONARY ARTERY BYPASS GRAFT  2006   x 5  . EP IMPLANTABLE DEVICE N/A 05/05/2015   Procedure: Pacemaker Implant;  Surgeon: JThompson Grayer MD;  [facility];  Service: Cardiovascular;  Laterality: N/A;  . FLEXIBLE SIGMOIDOSCOPY N/A 12/07/2015   Procedure: FLEXIBLE SIGMOIDOSCOPY;  Surgeon: VWilford Corner MD;  [facility];  Service: Endoscopy;  Laterality: N/A;  Unprepped  . HERNIA REPAIR    . INSERT / REPLACE / REMOVE PACEMAKER  05/05/2015  . PORTACATH PLACEMENT    . portacath removed    .  Redo Median sternotomy, extracorporeal cirulation, AVR, Tricuspid valve repair  06/13/2011   AVR(23-mm Edwards pericardial Magna-Ease valve./ TVrepair (34-mm Edwards MC3 annuloplasty ring  . TOTAL KNEE ARTHROPLASTY Right 05/24/2014   Procedure: RIGHT TOTAL KNEE ARTHROPLASTY;  Surgeon: Gearlean Alf, MD;  [facility];  Service: Orthopedics;  Laterality: Right;  . TRANSURETHRAL RESECTION OF PROSTATE  oct. 2012   TURP  . VASECTOMY  1977    ROS- all systems are reviewed and negative except as per HPI above  Current Outpatient Prescriptions  Medication Sig Dispense Refill  . acetaminophen (TYLENOL) 500 MG tablet Take 1,000 mg by mouth 2 (two) times daily as needed (pain).       [name] allopurinol (ZYLOPRIM) 100 MG tablet Take 100 mg by mouth 2 (two) times daily.    . cetirizine (ZYRTEC) 10 MG tablet Take 10 mg by mouth daily as needed for allergies.     . diazepam (VALIUM) 10 MG tablet Take 10 mg by mouth as needed (Muscle cramps).     . metoprolol (LOPRESSOR) 50 MG tablet Take 1 tablet (50 mg total) by mouth 2 (two) times daily. 60 tablet 11  . terazosin (HYTRIN) 5 MG capsule Take 1 capsule (5 mg total) by mouth every morning. 30 capsule 6  . Testosterone Cypionate 200 MG/ML KIT Inject 1 mL into the muscle every 14 (fourteen) days.    [name] torsemide (DEMADEX) 20 MG tablet Take 2 tablets (40 mg total) by mouth every morning. 30 tablet 1  . traMADol (ULTRAM) 50 MG tablet Take by mouth every 6 (six) hours as needed for moderate pain.    [name] warfarin (COUMADIN) 1 MG tablet Take 1 tablet (1 mg total) by mouth daily. (Patient taking differently: Take 1 mg by mouth daily. 2 mg mon, tues, wed, fri and 1 mg sun, thurs, sat) 30 tablet 1  . XIFAXAN 550 MG TABS tablet     . zolpidem (AMBIEN) 10 MG tablet Take 1 tablet (10 mg total) by mouth at bedtime as needed for sleep. 20 tablet 0   No current facility-administered medications for this visit.     Physical Exam: Vitals:   09/25/17 1000  BP: 118/70  Pulse: 66  SpO2: 97%  Weight: 215 lb 12.8 oz (97.9 kg)  Height: 5' 10"  (1.778 m)    GEN- The patient is overweight appearing, alert and oriented x 3 today.   Head- normocephalic, atraumatic Eyes-  Sclera clear, conjunctiva pink Ears- hearing intact Oropharynx- clear Lungs- Clear to ausculation bilaterally, normal work of breathing Chest- pacemaker pocket is well healed Heart- irregular rate and rhythm  GI- soft, NT, ND, + BS Extremities- no clubbing, cyanosis, or edema  Pacemaker interrogation- reviewed in detail today,  See PACEART report   Assessment and Plan:  1. Symptomatic bradycardia  Normal pacemaker function See Pace Art report No changes today He did not feel  well when reprogrammed VVIR in the past.  He is clear that he does not wish for programming changes  2. Permanent afib Rate controlled On anticoagulation with coumadin  3. Chronic mixed systolic and diastolic dysfunction Complicated by ESRD Now on peritoneal dialysis  Merlin Return to see me in a year  Thompson Grayer MD, [facility] 09/25/2017 11:11 AM

## 2017-09-25 NOTE — Progress Notes (Signed)
Cardiology Office Note   Date:  09/25/2017   ID:  [name] Fiddler, MD, DOB 1941-07-06, MRN [id number]  PCP:  [name] Bayley, DO  Cardiologist:   [name] Moores, MD , previous [facility] patient   Chief Complaint  Patient presents with  . Coronary Artery Disease  . 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 Able to sing without any difficulty BP and weight are up due to steroid dosing   Sad - his poodle died this past March 13, 2023.   2016/07/14:  Doing ok Still gets fatigued easily.   Walking down the main hall at [facility], has to stop 3 times to rest.   Perhaps some slight dizziness,  Has had some vertigo symptoms   No CP   Bp is typically lower .  A bit elevated today   Sep 26, 2016:  Here for pacer check and follow up . Will also dee [name] tomorrow  Seems to be holding steady  Creatinine is 2.95,  BUN is 70  Glucose levels have been ok  Had a calf injury since I've seen him .  Has gained 25 lbs - has lots of ascites Thinks he needs another paracentesis.   Is not eating meat - has lost his taste for meat. Eats scrambled eggs.   Oct. 3 , 2018:  [name] is seen today for follow up visit Has been seeing CHF clinic Is now on peritoneal dialysis - the decision was made  Has been in the hospital with mental status changes , found to have a very elevated ammonia level.  Spent 2 weeks in the hospital.   Then went to rehab. Has resumed all of his normal activities.   Still working with his   cars. Sherron Monday his [employer] last year.      Past Medical History:  Diagnosis Date  . A-fib ([place])    permanent with tachy-brady syndrome  . Anemia   . Anxiety   .  Atrial flutter (H[facility])    hx of  . Biceps tendon tear    right  . Bone marrow disease   . CHF (congestive heart failure) ([place])   . Chronic kidney disease (CKD), stage V (HCC)      colodonato, Peritoneal dialysis  . Complication of anesthesia 11-16-13   required alot of versed per anesthesia with cataract surgery  . Coronary heart disease    stent, CABG, RBBB  . DDD (degenerative disc disease)   . Diabetes ([place])   . Diabetes mellitus   . Fungal toenail infection   . Gout   . Hemorrhagic cystitis 13-Mar-2011  . Hepatitis    mono  . HTN (hypertension)   . Hypercalcemia 03/13/2010   due to sarcoidiosis  . Myocardial infarction [facility])    1994/03/13, 2002, 2006  . OSA (obstructive sleep apnea)    use bipap setting of 10 and 12  . Osteoarthritis   . Pneumonia 1966, 2011   hx of  . Presence of permanent cardiac pacemaker   . Right sciatic nerve pain    for block thursday  01-21-2014, injections  . Sarcoid 2011   pulmonary and bone marrow  . Shortness of breath dyspnea   . Synovial cyst of lumbar spine    l3-l4, l4-l5, injections  . Valvular heart disease    aortic stenosis/regurgitation    Past Surgical History:  Procedure Laterality Date  . BASAL CELL CARCINOMA EXCISION Right 2015   ear  . CARDIAC CATHETERIZATION N/A 12/10/2016   Procedure: Right Heart Cath;  Surgeon: Jolaine Artist, MD;  Location: [place] CV LAB;  Service: Cardiovascular;  Laterality: N/A;  . CARDIAC VALVE REPLACEMENT    . CARDIOVERSION N/A 12/30/2013   Procedure: CARDIOVERSION;  Surgeon: [name], MD;  Location: [facility] ENDOSCOPY;  Service: Cardiovascular;  Laterality: N/A;  10:49 cardioversion at 120 joules, then 150 joules, to SB  used  Lido 98m,  Propofol 160 mcg  . CARDIOVERSION  2003, 2006, 2012, 2013  . cataract surgery Right 2007  . cataract surgery Left 11-16-13  . COLONOSCOPY N/A 01/29/2014   Procedure: COLONOSCOPY;  Surgeon: JWinfield Cunas, MD;  [facility];  Service: Endoscopy;   Laterality: N/A;  amanda//ja  . CORONARY ARTERY BYPASS GRAFT  2006   x 5  . EP IMPLANTABLE DEVICE N/A 05/05/2015   Procedure: Pacemaker Implant;  Surgeon: JThompson Grayer MD;  [facility];  Service: Cardiovascular;  Laterality: N/A;  . FLEXIBLE SIGMOIDOSCOPY N/A 12/07/2015   Procedure: FLEXIBLE SIGMOIDOSCOPY;  Surgeon: VWilford Corner MD;  [facility];  Service: Endoscopy;  Laterality: N/A;  Unprepped  . HERNIA REPAIR    . INSERT / REPLACE / REMOVE PACEMAKER  05/05/2015  . PORTACATH PLACEMENT    . portacath removed    . Redo Median sternotomy, extracorporeal cirulation, AVR, Tricuspid valve repair  06/13/2011   AVR(23-mm Edwards pericardial Magna-Ease valve./ TVrepair (34-mm Edwards MC3 annuloplasty ring  . TOTAL KNEE ARTHROPLASTY Right 05/24/2014   Procedure: RIGHT TOTAL KNEE ARTHROPLASTY;  Surgeon: FGearlean Alf MD;  Location: [facility];  Service: Orthopedics;  Laterality: Right;  . TRANSURETHRAL RESECTION OF PROSTATE  oct. 2012   TURP  . VASECTOMY  1977     Current Outpatient Prescriptions  Medication Sig Dispense Refill  . acetaminophen (TYLENOL) 500 MG tablet Take 1,000 mg by mouth 2 (two) times daily as needed (pain).     .Marland Kitchenallopurinol (ZYLOPRIM) 100 MG tablet Take 100 mg by mouth 2 (two) times daily.    . cetirizine (ZYRTEC) 10 MG tablet Take 10 mg by mouth daily as needed for allergies.     . diazepam (VALIUM) 10 MG tablet Take 10 mg by mouth as needed (Muscle cramps).     . metoprolol (LOPRESSOR) 50 MG tablet Take 1 tablet (50 mg total) by mouth 2 (two) times daily. 60 tablet 11  . terazosin (HYTRIN) 5 MG capsule Take 1 capsule (5 mg total) by mouth every morning. 30 capsule 6  . Testosterone Cypionate 200 MG/ML KIT Inject 1 mL into the muscle every 14 (fourteen) days.    .Marland Kitchentorsemide (DEMADEX) 20 MG tablet Take 2 tablets (40 mg total) by mouth every morning. 30 tablet 1  . traMADol (ULTRAM) 50 MG tablet Take by mouth every 6 (six) hours as needed for  moderate pain.    .Marland Kitchenwarfarin (COUMADIN) 1 MG tablet Take 1 tablet (1 mg total) by mouth daily. (Patient taking differently: Take 1 mg by mouth daily. 2 mg mon, tues, wed, fri and 1 mg sun, thurs, sat) 30 tablet 1  . XDoreene Nest  550 MG TABS tablet     . zolpidem (AMBIEN) 10 MG tablet Take 1 tablet (10 mg total) by mouth at bedtime as needed for sleep. 20 tablet 0   No current facility-administered medications for this visit.     Allergies:   Actos [pioglitazone hydrochloride]; Codeine; Depakote [divalproex sodium]; Diltiazem; Hydralazine; Isosorbide dinitrate; Januvia [sitagliptin]; Loop diuretics; Losartan; Nsaids; Onglyza [saxagliptin]; Orudis [ketoprofen]; Penicillins; Potassium iodide; Rozerem [ramelteon]; Amiodarone; Latex; Verapamil; Benazepril hcl; Gentamycin [gentamicin]; Indomethacin; Minocycline; Pentazocine lactate; Poison ivy extract [poison ivy extract]; Sertraline hcl; and Shellfish allergy    Social History:  The patient  reports that he quit smoking about 21 years ago. His smoking use included Pipe and Cigars. He quit after 28.00 years of use. He has never used smokeless tobacco. He reports that he does not drink alcohol or use drugs.   Family History:  The patient's family history includes Allergies in his unknown relative; Asthma in his unknown relative; Cancer in his unknown relative; Heart attack in his father; Heart disease in his unknown relative; Stroke in his father and mother.    ROS:  Please see the history of present illness.    Review of Systems: Constitutional:  denies fever, chills, diaphoresis, appetite change and fatigue.  HEENT: denies photophobia, eye pain, redness, hearing loss, ear pain, congestion, sore throat, rhinorrhea, sneezing, neck pain, neck stiffness and tinnitus.  Respiratory: denies SOB, DOE, cough, chest tightness, and wheezing.  Cardiovascular: denies chest pain, palpitations and leg swelling.  Gastrointestinal: denies nausea, vomiting, abdominal  pain, diarrhea, constipation, blood in stool.  Genitourinary: denies dysuria, urgency, frequency, hematuria, flank pain and difficulty urinating.  Musculoskeletal: denies  myalgias, back pain, joint swelling, arthralgias and gait problem.   Skin: denies pallor, rash and wound.  Neurological: denies dizziness, seizures, syncope, weakness, light-headedness, numbness and headaches.   Hematological: denies adenopathy, easy bruising, personal or family bleeding history.  Psychiatric/ Behavioral: denies suicidal ideation, mood changes, confusion, nervousness, sleep disturbance and agitation.       All other systems are reviewed and negative.   Physical Exam: Blood pressure 116/70, pulse 67, height 5' 11"  (1.803 m), weight 226 lb 12.8 oz (102.9 kg), SpO2 97 %.  GEN:  Well nourished, well developed in no acute distress HEENT: Normal NECK: No JVD; No carotid bruits LYMPHATICS: No lymphadenopathy CARDIAC: Irreg.  Irreg. ,  2/6 systolic murmur,   RESPIRATORY:  Clear to auscultation without rales, wheezing or rhonchi  ABDOMEN: 0bese,  Non tender,  + BS  MUSCULOSKELETAL:  Trace edema   SKIN: Warm and dry NEUROLOGIC:  Alert and oriented x 3    EKG:  EKG is not ordered today.   Recent Labs: 04/26/2017: ALT 12; BUN 39; Creatinine, Ser 2.21; Hemoglobin 13.4; Platelets 125; Potassium 3.0; Sodium 136    Lipid Panel    Component Value Date/Time   CHOL 106 05/28/2014 0140   TRIG 169 (H) 05/28/2014 0140   HDL 14 (L) 05/28/2014 0140   CHOLHDL 7.6 05/28/2014 0140   VLDL 34 05/28/2014 0140   LDLCALC 58 05/28/2014 0140      Wt Readings from Last 3 Encounters:  09/25/17 226 lb 12.8 oz (102.9 kg)  06/06/17 198 lb 11.2 oz (90.1 kg)  05/01/17 203 lb (92.1 kg)      Other studies Reviewed: Additional studies/ records that were reviewed today include: . Review of the above records demonstrates:    ASSESSMENT AND PLAN:  1.  Chronic Combined systolic and diastolic congestive heart failure.   His  symptoms are very stable. He's not had any severe CHF symptoms since starting peritoneal dialysis.   2. Chroic kidney disese On peritoneal dialysis  3. CAD - no episodes of angina. Continue current medications.  4. Aortic valve replacement:  Stable    Current medicines are reviewed at length with the patient today.  The patient does not have concerns regarding medicines.  The following changes have been made:  no change  Labs/ tests ordered today include:   No orders of the defined types were placed in this encounter.    Disposition:   FU with me in 6 months     [name] Moores, MD  09/25/2017 10:32 AM    [facility] [address], [place], [place]  [address] Phone: [phone]; Fax: [phone]) [phone]  .

## 2017-09-25 NOTE — Patient Instructions (Signed)

## 2017-09-30 ENCOUNTER — Other Ambulatory Visit: Payer: Self-pay | Admitting: Cardiovascular Disease

## 2017-10-12 LAB — CUP PACEART INCLINIC DEVICE CHECK
Battery Remaining Longevity: 130 mo
Battery Voltage: 3.01 V
Brady Statistic RV Percent Paced: 57 %
Implantable Lead Implant Date: 20160512
Implantable Lead Model: 1948
Implantable Pulse Generator Implant Date: 20160512
Lead Channel Impedance Value: 462.5 Ohm
Lead Channel Pacing Threshold Amplitude: 1.25 V
Lead Channel Pacing Threshold Pulse Width: 0.7 ms
Lead Channel Sensing Intrinsic Amplitude: 1.9 mV
Lead Channel Setting Pacing Amplitude: 2 V
Lead Channel Setting Pacing Pulse Width: 0.7 ms
Lead Channel Setting Sensing Sensitivity: 2 mV
MDC IDC LEAD IMPLANT DT: 20160512
MDC IDC LEAD LOCATION: 753859
MDC IDC LEAD LOCATION: 753860
MDC IDC MSMT LEADCHNL RV IMPEDANCE VALUE: 612.5 Ohm
MDC IDC MSMT LEADCHNL RV SENSING INTR AMPL: 8.6 mV
MDC IDC PG SERIAL: 7759894
MDC IDC SESS DTM: 20181003144956
MDC IDC SET LEADCHNL RV PACING AMPLITUDE: 2.5 V
MDC IDC STAT BRADY RA PERCENT PACED: 0.46 %

## 2017-11-12 ENCOUNTER — Ambulatory Visit (INDEPENDENT_AMBULATORY_CARE_PROVIDER_SITE_OTHER): Payer: Medicare Other | Admitting: *Deleted

## 2017-11-12 DIAGNOSIS — I495 Sick sinus syndrome: Secondary | ICD-10-CM

## 2017-11-13 LAB — CUP PACEART REMOTE DEVICE CHECK
Battery Remaining Percentage: 95.5 %
Battery Voltage: 2.99 V
Brady Statistic AP VP Percent: 28 %
Brady Statistic AS VS Percent: 3.3 %
Brady Statistic RA Percent Paced: 1 %
Date Time Interrogation Session: 20181120070015
Implantable Lead Implant Date: 20160512
Implantable Lead Location: 753859
Implantable Pulse Generator Implant Date: 20160512
Lead Channel Impedance Value: 460 Ohm
Lead Channel Impedance Value: 630 Ohm
Lead Channel Pacing Threshold Pulse Width: 0.7 ms
Lead Channel Sensing Intrinsic Amplitude: 1.9 mV
Lead Channel Setting Pacing Amplitude: 2 V
MDC IDC LEAD IMPLANT DT: 20160512
MDC IDC LEAD LOCATION: 753860
MDC IDC MSMT BATTERY REMAINING LONGEVITY: 125 mo
MDC IDC MSMT LEADCHNL RV PACING THRESHOLD AMPLITUDE: 1.25 V
MDC IDC MSMT LEADCHNL RV SENSING INTR AMPL: 10.4 mV
MDC IDC SET LEADCHNL RV PACING AMPLITUDE: 2.5 V
MDC IDC SET LEADCHNL RV PACING PULSEWIDTH: 0.7 ms
MDC IDC SET LEADCHNL RV SENSING SENSITIVITY: 2 mV
MDC IDC STAT BRADY AP VS PERCENT: 14 %
MDC IDC STAT BRADY AS VP PERCENT: 40 %
MDC IDC STAT BRADY RV PERCENT PACED: 42 %
Pulse Gen Model: 2240
Pulse Gen Serial Number: 7759894

## 2017-11-13 NOTE — Progress Notes (Signed)
Remote pacemaker transmission.   

## 2017-11-20 ENCOUNTER — Telehealth (HOSPITAL_COMMUNITY): Payer: Self-pay | Admitting: Internal Medicine

## 2017-11-20 ENCOUNTER — Other Ambulatory Visit: Payer: Self-pay

## 2017-11-20 ENCOUNTER — Ambulatory Visit (HOSPITAL_COMMUNITY)
Admission: RE | Admit: 2017-11-20 | Discharge: 2017-11-20 | Disposition: A | Discharge status: Home / Self Care | Payer: Medicare Other | Source: Ambulatory Visit | Attending: Internal Medicine | Admitting: Internal Medicine

## 2017-11-20 ENCOUNTER — Encounter (HOSPITAL_COMMUNITY): Payer: Self-pay | Admitting: Internal Medicine

## 2017-11-20 VITALS — BP 142/80 | HR 63 | Wt 225.5 lb

## 2017-11-20 DIAGNOSIS — Z91040 Latex allergy status: Secondary | ICD-10-CM | POA: Diagnosis not present

## 2017-11-20 DIAGNOSIS — I132 Hypertensive heart and chronic kidney disease with heart failure and with stage 5 chronic kidney disease, or end stage renal disease: Secondary | ICD-10-CM | POA: Diagnosis not present

## 2017-11-20 DIAGNOSIS — Z7902 Long term (current) use of antithrombotics/antiplatelets: Secondary | ICD-10-CM | POA: Insufficient documentation

## 2017-11-20 DIAGNOSIS — M109 Gout, unspecified: Secondary | ICD-10-CM | POA: Insufficient documentation

## 2017-11-20 DIAGNOSIS — Z951 Presence of aortocoronary bypass graft: Secondary | ICD-10-CM | POA: Insufficient documentation

## 2017-11-20 DIAGNOSIS — I2510 Atherosclerotic heart disease of native coronary artery without angina pectoris: Secondary | ICD-10-CM | POA: Diagnosis not present

## 2017-11-20 DIAGNOSIS — Z885 Allergy status to narcotic agent status: Secondary | ICD-10-CM | POA: Insufficient documentation

## 2017-11-20 DIAGNOSIS — Z888 Allergy status to other drugs, medicaments and biological substances status: Secondary | ICD-10-CM | POA: Insufficient documentation

## 2017-11-20 DIAGNOSIS — I4510 Unspecified right bundle-branch block: Secondary | ICD-10-CM | POA: Diagnosis not present

## 2017-11-20 DIAGNOSIS — N185 Chronic kidney disease, stage 5: Secondary | ICD-10-CM | POA: Insufficient documentation

## 2017-11-20 DIAGNOSIS — Z992 Dependence on renal dialysis: Secondary | ICD-10-CM | POA: Insufficient documentation

## 2017-11-20 DIAGNOSIS — I252 Old myocardial infarction: Secondary | ICD-10-CM | POA: Diagnosis not present

## 2017-11-20 DIAGNOSIS — Z825 Family history of asthma and other chronic lower respiratory diseases: Secondary | ICD-10-CM | POA: Insufficient documentation

## 2017-11-20 DIAGNOSIS — Z87891 Personal history of nicotine dependence: Secondary | ICD-10-CM | POA: Insufficient documentation

## 2017-11-20 DIAGNOSIS — I482 Chronic atrial fibrillation, unspecified: Secondary | ICD-10-CM

## 2017-11-20 DIAGNOSIS — I5042 Chronic combined systolic (congestive) and diastolic (congestive) heart failure: Secondary | ICD-10-CM | POA: Insufficient documentation

## 2017-11-20 DIAGNOSIS — Z952 Presence of prosthetic heart valve: Secondary | ICD-10-CM | POA: Diagnosis not present

## 2017-11-20 DIAGNOSIS — Z809 Family history of malignant neoplasm, unspecified: Secondary | ICD-10-CM | POA: Insufficient documentation

## 2017-11-20 DIAGNOSIS — D869 Sarcoidosis, unspecified: Secondary | ICD-10-CM | POA: Insufficient documentation

## 2017-11-20 DIAGNOSIS — Z886 Allergy status to analgesic agent status: Secondary | ICD-10-CM | POA: Insufficient documentation

## 2017-11-20 DIAGNOSIS — M1990 Unspecified osteoarthritis, unspecified site: Secondary | ICD-10-CM | POA: Insufficient documentation

## 2017-11-20 DIAGNOSIS — Z880 Allergy status to penicillin: Secondary | ICD-10-CM | POA: Insufficient documentation

## 2017-11-20 DIAGNOSIS — G4733 Obstructive sleep apnea (adult) (pediatric): Secondary | ICD-10-CM | POA: Insufficient documentation

## 2017-11-20 DIAGNOSIS — E1122 Type 2 diabetes mellitus with diabetic chronic kidney disease: Secondary | ICD-10-CM | POA: Insufficient documentation

## 2017-11-20 DIAGNOSIS — Z79899 Other long term (current) drug therapy: Secondary | ICD-10-CM | POA: Insufficient documentation

## 2017-11-20 DIAGNOSIS — F419 Anxiety disorder, unspecified: Secondary | ICD-10-CM | POA: Insufficient documentation

## 2017-11-20 DIAGNOSIS — R188 Other ascites: Secondary | ICD-10-CM | POA: Diagnosis not present

## 2017-11-20 DIAGNOSIS — K761 Chronic passive congestion of liver: Secondary | ICD-10-CM | POA: Insufficient documentation

## 2017-11-20 DIAGNOSIS — I082 Rheumatic disorders of both aortic and tricuspid valves: Secondary | ICD-10-CM | POA: Diagnosis not present

## 2017-11-20 DIAGNOSIS — I4892 Unspecified atrial flutter: Secondary | ICD-10-CM | POA: Diagnosis not present

## 2017-11-20 DIAGNOSIS — I5022 Chronic systolic (congestive) heart failure: Secondary | ICD-10-CM

## 2017-11-20 DIAGNOSIS — Z823 Family history of stroke: Secondary | ICD-10-CM | POA: Insufficient documentation

## 2017-11-20 DIAGNOSIS — Z8249 Family history of ischemic heart disease and other diseases of the circulatory system: Secondary | ICD-10-CM | POA: Insufficient documentation

## 2017-11-20 DIAGNOSIS — Z950 Presence of cardiac pacemaker: Secondary | ICD-10-CM | POA: Insufficient documentation

## 2017-11-20 DIAGNOSIS — Z91048 Other nonmedicinal substance allergy status: Secondary | ICD-10-CM | POA: Insufficient documentation

## 2017-11-20 DIAGNOSIS — Z881 Allergy status to other antibiotic agents status: Secondary | ICD-10-CM | POA: Insufficient documentation

## 2017-11-20 DIAGNOSIS — Z91013 Allergy to seafood: Secondary | ICD-10-CM | POA: Insufficient documentation

## 2017-11-20 DIAGNOSIS — Z953 Presence of xenogenic heart valve: Secondary | ICD-10-CM | POA: Insufficient documentation

## 2017-11-20 DIAGNOSIS — Z9889 Other specified postprocedural states: Secondary | ICD-10-CM | POA: Insufficient documentation

## 2017-11-20 NOTE — Progress Notes (Signed)
Patient ID: [name] Fiddler, [name], male   DOB: 03-24-1941, 76 y.o.   MRN: [id number]    Advanced Heart Failure Clinic Note   Referring Physician: Dr. Acie Fredrickson PCP: Betty Martinique, [name] Cardiologist: Mertie Moores, [name] , previous [facility] patient  Nephrologist: Dr. Arty Baumgartner.  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   We saw him in the HF Clinic for the first time in July 2017. Markedly volume overloaded with R>>L heart failure and ascites. Underwent 3L paracentesis.   Admitted 04/08/17-04/16/17 with hepatic encephalopathy, ammonia >200 , started on lactulose. He also was started on peritoneal dialysis.   He returns today for HF follow up. Now doing PD and says it is much better. Energy level is much better. Edema well controlled.  Tells me he found a generic for rifaxin and getting it from [place]. Recently had ammonia drawn and waiting for results.   Studies:  Echo 09/07/15 LVEF 50-55%, RV mildly dilated, bioprosthetic AV and s/p tricuspid valve repair  Echo 12/04/15 LVEF 35-40%, mild MR, mild LAE, RV mildly dilated and moderately reduced, Mod RAE, PA peak pressure 39 mm Hg, bioprosthetic AV and s/p tricuspid valve repair  Echo 7/17 LVEF 40-45% RV severely HK. RVSP 58m HG bioprosthetic AV and s/p tricuspid valve repair  CT abdomen pelvis 12/06/15 with heterogenous hepatic steatosis and advanced atherosclerosis, status post median sternotomy.  RSalem12/17 (volume overloaded at time)  RA = 18 RV = 79/17 PA = 80/23 (46) PCW = 31 (v waves to 40) Fick cardiac output/index =  4.9/2.2 PVR = 3.0 WU FA sat = 98% PA sat = 70%, 71% High SVC sat = 70%    Past Medical History:  Diagnosis Date  . A-fib ([place]    permanent with tachy-brady syndrome  . Anemia   . Anxiety   . Atrial flutter (HCC)    hx of  . Biceps tendon tear    right  . Bone marrow disease   . CHF (congestive heart failure) ([place]   . Chronic kidney disease (CKD), stage V (HCC)      colodonato, Peritoneal dialysis  . Complication of anesthesia 11-16-13   required alot of versed per anesthesia with cataract surgery  . Coronary heart disease    stent, CABG, RBBB  . DDD (degenerative disc disease)   . Diabetes ([place]   . Diabetes mellitus   . Fungal toenail infection   . Gout   . Hemorrhagic cystitis 2012  . Hepatitis    mono  . HTN (hypertension)   . Hypercalcemia 2011   due to sarcoidiosis  . Myocardial infarction ([facility]    1995, 2002, 2006  . OSA (obstructive sleep apnea)    use bipap setting of 10 and 12  . Osteoarthritis   . Pneumonia 1966, 2011   hx of  . Presence of permanent cardiac pacemaker   . Right sciatic nerve pain    for block thursday 01-21-2014, injections  . Sarcoid 2011   pulmonary and bone marrow  . Shortness of breath dyspnea   . Synovial cyst of lumbar spine    l3-l4, l4-l5, injections  . Valvular  heart disease    aortic stenosis/regurgitation    Current Outpatient Medications  Medication Sig Dispense Refill  . acetaminophen (TYLENOL) 500 MG tablet Take 1,000 mg by mouth 2 (two) times daily as needed (pain).     [name] allopurinol (ZYLOPRIM) 100 MG tablet Take 100 mg by mouth 2 (two) times daily.    . cetirizine (ZYRTEC) 10 MG tablet Take 10 mg by mouth daily as needed for allergies.     . diazepam (VALIUM) 10 MG tablet Take 10 mg by mouth as needed (Muscle cramps).     . metoprolol tartrate (LOPRESSOR) 50 MG tablet TAKE ONE TABLET BY MOUTH TWICE A DAY 60 tablet 11  . terazosin (HYTRIN) 5 MG capsule Take 1 capsule (5 mg total) by mouth every morning. 30  capsule 6  . Testosterone Cypionate 200 MG/ML KIT Inject 1 mL into the muscle every 14 (fourteen) days.    [name] torsemide (DEMADEX) 20 MG tablet Take 2 tablets (40 mg total) by mouth every morning. 30 tablet 1  . traMADol (ULTRAM) 50 MG tablet Take by mouth every 6 (six) hours as needed for moderate pain.    [name] warfarin (COUMADIN) 1 MG tablet Take 1 tablet (1 mg total) by mouth daily. (Patient taking differently: Take 1 mg by mouth daily. 2 mg mon, tues, wed, fri and 1 mg sun, thurs, sat) 30 tablet 1  . XIFAXAN 550 MG TABS tablet     . zolpidem (AMBIEN) 10 MG tablet Take 1 tablet (10 mg total) by mouth at bedtime as needed for sleep. 20 tablet 0   No current facility-administered medications for this encounter.     Allergies  Allergen Reactions  . Actos [Pioglitazone Hydrochloride] Swelling and Other (See Comments)    Severe peripheral edema  . Codeine Other (See Comments)    Makes Pt aggressive  . Depakote [Divalproex Sodium] Diarrhea    severe  . Diltiazem Other (See Comments)    lethargy and dyspnea  . Hydralazine Other (See Comments)    Severe chills and SOB   . Isosorbide Dinitrate Other (See Comments)    Severe chills and SOB   . Januvia [Sitagliptin] Diarrhea and Other (See Comments)    bradycardia  . Loop Diuretics Other (See Comments)    Spike in BUN and creatine levels  . Losartan Other (See Comments)    aphasia  . Nsaids Other (See Comments)    Renal problems  . Onglyza [Saxagliptin] Diarrhea and Other (See Comments)    bradycardia  . Orudis [Ketoprofen] Other (See Comments)    Cannot take due to renal insufficiency  . Penicillins Swelling and Other (See Comments)    Serum sickness Has patient had a PCN reaction causing immediate rash, facial/tongue/throat swelling, SOB or lightheadedness with hypotension: Yes Has patient had a PCN reaction causing severe rash involving mucus membranes or skin necrosis: No Has patient had a PCN reaction that required hospitalization  Yes Has patient had a PCN reaction occurring within the last 10 years: No If all of the above answers are "NO", then may proceed with Cephalosporin use.   [name] Potassium Iodide Itching and Rash    Severe entire body itching and rash  . Rozerem [Ramelteon] Diarrhea    Severe   . Amiodarone Other (See Comments)    diarrhea  . Latex Swelling  . Verapamil Other (See Comments)    Severe fatigue  . Benazepril Hcl Other (See Comments)    ? Possible lowers platelets  . Gentamycin [Gentamicin]  Rash  . Indomethacin Other (See Comments)    Renal problems   . Minocycline Other (See Comments)    Makes dizzy and felt just lousy.  [name] Pentazocine Lactate Other (See Comments)    Unknown allergic reaction - pt and wife do not recall this  . Poison Ivy Extract [Poison Ivy Extract] Rash and Other (See Comments)    blisters  . Sertraline Hcl Other (See Comments)    Pt and wife do not recall this  . Shellfish Allergy Rash      Social History   Socioeconomic History  . Marital status: Married    Spouse name: Not on file  . Number of children: 4  . Years of education: Not on file  . Highest education level: Not on file  Social Needs  . Financial resource strain: Not on file  . Food insecurity - worry: Not on file  . Food insecurity - inability: Not on file  . Transportation needs - medical: Not on file  . Transportation needs - non-medical: Not on file  Occupational History  . Occupation: retired Administrator, [employer]    Comment: due to coronary disease in 2000  . 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 Smoking status: Former Smoker    Years: 28.00    Types: Pipe, Cigars    Last attempt to quit: 12/25/1995    Years since quitting: 21.9  . Smokeless tobacco: Never Used  Substance and Sexual Activity  . Alcohol use: No    Comment: 3-5 beer or wine per week  . Drug use: No  . Sexual activity: Not on file  Other Topics Concern  . Not on file    Social History Narrative  . Not on file      Family History  Problem Relation Age of Onset  . Heart attack Father   . Stroke Father   . Stroke Mother   . Allergies Unknown   . Asthma Unknown   . Heart disease Unknown   . Cancer Unknown     Vitals:   11/20/17 1454  BP: (!) 142/80  Pulse: 63  SpO2: 97%  Weight: 225 lb 8 oz (102.3 kg)   Wt Readings from Last 3 Encounters:  11/20/17 225 lb 8 oz (102.3 kg)  09/25/17 215 lb 12.8 oz (97.9 kg)  09/25/17 226 lb 12.8 oz (102.9 kg)    PHYSICAL EXAM: General:  Well appearing. No resp difficulty HEENT: normal Neck: supple. JVP 6-7 Carotids 2+ bilat; no bruits. No lymphadenopathy or thryomegaly appreciated. Cor: PMI nondisplaced. Irregular rate & rhythm. No rubs, gallops or murmurs. Lungs: clear Abdomen: obese soft, nontender, nondistended. No hepatosplenomegaly. No bruits or masses. Good bowel sounds. +PD catheted Extremities: no cyanosis, clubbing, rash, trace- 1+ edema Neuro: alert & orientedx3, cranial nerves grossly intact. moves all 4 extremities w/o difficulty. Affect pleasant. No asterixis.   ASSESSMENT & PLAN: 1. Chronic combined CHF with R >> L symptoms : Echo 7/17 LVEF 40-45% RV severely HK. RVSP 26m HG bioprosthetic AV and s/p tricuspid valve repair - Doing much better since starting PD - Volume status well controlled with PD and still makes some urine on torsemide - Will repeat echo to reassess EF 2. CAD status post CABG 2006 --Cath in 2012 with all grafts patent but severe disease in native vessels in OM and LAD territories - No s/s ischemia.  - Continue bblocker - Off statin with liver disease - No ASA  with warfarin 3. Aortic stenosis and tricuspid regurgitation status post bioprosthetic AVR 2012 and tricuspid valve repair in 05/2011 - Stable.  4. Chronic atrial fibrillation  - On warfarin - Rate controlled.  5. CKD Stage IV  - Started PD in April 2018, follows with Dr. Arty Baumgartner. Doing much better 6. DM  2  7. Anxiety  8. Cardiac cirrhosis with ascites - Doing well on current therapy. No evidence of hepatic encephalopathy on exam 9. OHS/OSA: - Wears CPAP at night.    Glori Bickers, [name] 3:24 PM

## 2017-11-20 NOTE — Patient Instructions (Signed)
Your physician has requested that you have an echocardiogram. Echocardiography is a painless test that uses sound waves to create images of your heart. It provides your doctor with information about the size and shape of your heart and how well your heart's chambers and valves are working. This procedure takes approximately one hour. There are no restrictions for this procedure.   Your physician recommends that you schedule a follow-up appointment in: 6 months with Dr. Haroldine Laws  (we will call you)

## 2017-11-21 ENCOUNTER — Encounter: Payer: Self-pay | Admitting: Cardiology

## 2017-11-22 ENCOUNTER — Ambulatory Visit (HOSPITAL_COMMUNITY): Payer: Medicare Other | Attending: Cardiology

## 2017-11-22 ENCOUNTER — Other Ambulatory Visit: Payer: Self-pay

## 2017-11-22 DIAGNOSIS — I5022 Chronic systolic (congestive) heart failure: Secondary | ICD-10-CM | POA: Diagnosis not present

## 2017-11-22 DIAGNOSIS — I2510 Atherosclerotic heart disease of native coronary artery without angina pectoris: Secondary | ICD-10-CM | POA: Insufficient documentation

## 2017-11-22 DIAGNOSIS — I4891 Unspecified atrial fibrillation: Secondary | ICD-10-CM | POA: Diagnosis not present

## 2017-11-22 DIAGNOSIS — Z952 Presence of prosthetic heart valve: Secondary | ICD-10-CM | POA: Diagnosis not present

## 2017-11-22 DIAGNOSIS — I272 Pulmonary hypertension, unspecified: Secondary | ICD-10-CM | POA: Diagnosis not present

## 2017-11-22 DIAGNOSIS — E119 Type 2 diabetes mellitus without complications: Secondary | ICD-10-CM | POA: Insufficient documentation

## 2017-11-22 DIAGNOSIS — I110 Hypertensive heart disease with heart failure: Secondary | ICD-10-CM | POA: Insufficient documentation

## 2017-11-28 NOTE — Telephone Encounter (Signed)
User: Cherie Dark A Date/time: 11/20/17 3:49 PM  Comment: Called pt and lmsg for him to CB to get scheduled for an echo.   Context:  Outcome: Left Message  Phone number: [phone] Phone Type: Home Phone  Comm. type: Telephone Call type: Outgoing  Contact: [name], MD Relation to patient: Self

## 2017-12-18 ENCOUNTER — Other Ambulatory Visit: Payer: Self-pay | Admitting: Internal Medicine

## 2018-02-07 ENCOUNTER — Other Ambulatory Visit: Payer: Self-pay | Admitting: Nephrology

## 2018-02-07 ENCOUNTER — Ambulatory Visit
Admission: RE | Admit: 2018-02-07 | Discharge: 2018-02-07 | Disposition: A | Discharge status: Home / Self Care | Payer: Medicare Other | Source: Ambulatory Visit | Attending: Nephrology | Admitting: Nephrology

## 2018-02-07 DIAGNOSIS — R58 Hemorrhage, not elsewhere classified: Secondary | ICD-10-CM

## 2018-02-11 ENCOUNTER — Ambulatory Visit (INDEPENDENT_AMBULATORY_CARE_PROVIDER_SITE_OTHER): Payer: Medicare Other | Admitting: *Deleted

## 2018-02-11 DIAGNOSIS — I495 Sick sinus syndrome: Secondary | ICD-10-CM | POA: Diagnosis not present

## 2018-02-11 NOTE — Progress Notes (Signed)
Remote pacemaker transmission.   

## 2018-02-12 ENCOUNTER — Telehealth: Payer: Self-pay | Admitting: Hematology

## 2018-02-12 ENCOUNTER — Encounter: Payer: Self-pay | Admitting: Hematology

## 2018-02-12 LAB — CUP PACEART REMOTE DEVICE CHECK
Battery Voltage: 3.01 V
Brady Statistic AP VP Percent: 42 %
Brady Statistic AS VS Percent: 2.6 %
Brady Statistic RA Percent Paced: 1 %
Brady Statistic RV Percent Paced: 47 %
Date Time Interrogation Session: 20190219070014
Implantable Lead Implant Date: 20160512
Implantable Lead Location: 753859
Implantable Lead Location: 753860
Implantable Lead Model: 1948
Implantable Pulse Generator Implant Date: 20160512
Lead Channel Impedance Value: 480 Ohm
Lead Channel Impedance Value: 610 Ohm
Lead Channel Sensing Intrinsic Amplitude: 0.8 mV
Lead Channel Sensing Intrinsic Amplitude: 12 mV
Lead Channel Setting Pacing Amplitude: 2.5 V
Lead Channel Setting Pacing Pulse Width: 0.7 ms
Lead Channel Setting Sensing Sensitivity: 2 mV
MDC IDC LEAD IMPLANT DT: 20160512
MDC IDC MSMT BATTERY REMAINING LONGEVITY: 125 mo
MDC IDC MSMT BATTERY REMAINING PERCENTAGE: 95.5 %
MDC IDC MSMT LEADCHNL RV PACING THRESHOLD AMPLITUDE: 1.25 V
MDC IDC MSMT LEADCHNL RV PACING THRESHOLD PULSEWIDTH: 0.7 ms
MDC IDC SET LEADCHNL RA PACING AMPLITUDE: 2 V
MDC IDC STAT BRADY AP VS PERCENT: 8.9 %
MDC IDC STAT BRADY AS VP PERCENT: 37 %
Pulse Gen Model: 2240
Pulse Gen Serial Number: 7759894

## 2018-02-12 NOTE — Telephone Encounter (Signed)
Appt has been scheduled for the pt to see Dr. Irene Limbo on 3/7 at 1pm. Dr. Elissa Hefty office will give the pt the appt date and time. I gave them the fax# to send the pt's labs and office notes. Letter mailed.

## 2018-02-13 ENCOUNTER — Encounter: Payer: Self-pay | Admitting: Cardiology

## 2018-02-26 NOTE — Progress Notes (Signed)
HEMATOLOGY/ONCOLOGY CONSULTATION NOTE  Date of Service: 02/27/2018  Patient Care Team: Glenford Bayley, DO as PCP - General (Family Medicine) Gaye Pollack, MD (Cardiothoracic Surgery) [name], MD as Consulting Physician (Cardiology) Thompson Grayer, MD as Consulting Physician (Clinical Cardiac Electrophysiology) Donato Heinz, MD as Consulting Physician (Nephrology) Laurence Spates, MD as Consulting Physician (Gastroenterology) Demetrius Revel, MD as Consulting Physician (Surgery)  CHIEF COMPLAINTS/PURPOSE OF CONSULTATION:  Elevated Hemoglobin  HISTORY OF PRESENTING ILLNESS:   [name] Fiddler, 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 Donetta Potts for evaluation and management of elevated hemoglobin.   He is accompanied today by his wife. 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   The pt notes that he has not had a history of high hemoglobin. He denies a history of blood clots. He notes using a Bipap for his sleep apnea for at least 10 years. He notes that he has not had a sleep study since 10 years ago, but his machine has not indicated significant episodes of apnea. Nevertheless, he notes that he does not sleep well. He notes that after he received his Hep B vaccine he noticed decreased appetite. He also notes that his platelets have always been low.   He notes that he is on Coumadin for a fib and has not noted any bleeding.  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 He denies any concern for a coagulopathy. He notes that his liver functions have all been remarkably normal recently but that there were concerns with congestive hepatopathy in the past.  He notes that there has been an attempt to pull out significant amounts of fluid and that could lead to hemoconcentration.  The pt notes that his O2  Sat is at 97% walking around with no concerns with hypoxia.   Most recent lab results (02/24/18) show Hgb at 17.1, RDW at 17.0, Neutrophils at 79.7%, Lymphocytes at 6.9%, Platelets at 71k.   On review of systems, pt reports being frequently cold, and denies fevers, night sweats, abdominal pains, and any other symptoms.   On PMHx the pt reports end stage renal disease, sarcoidosis (presenting with thrombocytopenia in the past), is on peritoneal dialysis for ESRD, diastolic CHF, hepatic disease, has a pacemaker.    MEDICAL HISTORY:  Past Medical History:  Diagnosis Date  . A-fib ([place])    permanent with tachy-brady syndrome  . Anemia   . Anxiety   . Atrial flutter ([facility])    hx of  . Biceps tendon tear    right  . Bone marrow disease   . CHF (congestive heart failure) ([place])   . Chronic kidney disease (CKD), stage V ([facility])      colodonato, Peritoneal dialysis  . Complication of anesthesia 11-16-13   required alot of versed per anesthesia with cataract surgery  . Coronary heart disease    stent, CABG, RBBB  . DDD (degenerative disc disease)   . Diabetes ([place])   . Diabetes mellitus   . Fungal toenail infection   . Gout   . Hemorrhagic cystitis 2012  . Hepatitis    mono  . HTN (hypertension)   . Hypercalcemia 2011   due to sarcoidiosis  . Myocardial infarction [facility])    1995, 2002, 2006  . OSA (obstructive sleep apnea)  use bipap setting of 10 and 12  . Osteoarthritis   . Pneumonia 1966, 2011   hx of  . Presence of permanent cardiac pacemaker   . Right sciatic nerve pain    for block thursday 01-21-2014, injections  . Sarcoid 2011   pulmonary and bone marrow  . Shortness of breath dyspnea   . Synovial cyst of lumbar spine    l3-l4, l4-l5, injections  . Valvular heart disease    aortic stenosis/regurgitation    SURGICAL HISTORY: Past Surgical History:  Procedure Laterality Date  . BASAL CELL CARCINOMA EXCISION Right 2015   ear  . CARDIAC CATHETERIZATION N/A  12/10/2016   Procedure: Right Heart Cath;  Surgeon: Jolaine Artist, MD;  Location: [facility];  Service: Cardiovascular;  Laterality: N/A;  . CARDIAC VALVE REPLACEMENT    . CARDIOVERSION N/A 12/30/2013   Procedure: CARDIOVERSION;  Surgeon: [name], MD;  Location: [facility] ENDOSCOPY;  Service: Cardiovascular;  Laterality: N/A;  10:49 cardioversion at 120 joules, then 150 joules, to SB  used  Lido 71m,  Propofol 160 mcg  . CARDIOVERSION  2003, 2006, 2012, 2013  . cataract surgery Right 2007  . cataract surgery Left 11-16-13  . COLONOSCOPY N/A 01/29/2014   Procedure: COLONOSCOPY;  Surgeon: JWinfield Cunas, MD;  [facility];  Service: Endoscopy;  Laterality: N/A;  amanda//ja  . CORONARY ARTERY BYPASS GRAFT  2006   x 5  . EP IMPLANTABLE DEVICE N/A 05/05/2015   Procedure: Pacemaker Implant;  Surgeon: JThompson Grayer MD;  [facility];  Service: Cardiovascular;  Laterality: N/A;  . FLEXIBLE SIGMOIDOSCOPY N/A 12/07/2015   Procedure: FLEXIBLE SIGMOIDOSCOPY;  Surgeon: VWilford Corner MD;  [facility];  Service: Endoscopy;  Laterality: N/A;  Unprepped  . HERNIA REPAIR    . INSERT / REPLACE / REMOVE PACEMAKER  05/05/2015  . PORTACATH PLACEMENT    . portacath removed    . Redo Median sternotomy, extracorporeal cirulation, AVR, Tricuspid valve repair  06/13/2011   AVR(23-mm Edwards pericardial Magna-Ease valve./ TVrepair (34-mm Edwards MC3 annuloplasty ring  . TOTAL KNEE ARTHROPLASTY Right 05/24/2014   Procedure: RIGHT TOTAL KNEE ARTHROPLASTY;  Surgeon: FGearlean Alf MD;  Location: [facility];  Service: Orthopedics;  Laterality: Right;  . TRANSURETHRAL RESECTION OF PROSTATE  oct. 2012   TURP  . VASECTOMY  1977    SOCIAL HISTORY: Social History   Socioeconomic History  . Marital status: Married    Spouse name: Not on file  . Number of children: 4  . Years of education: Not on file  . Highest education level: Not on file  Social Needs  .  Financial resource strain: Not on file  . Food insecurity - worry: Not on file  . Food insecurity - inability: Not on file  . Transportation needs - medical: Not on file  . Transportation needs - non-medical: Not on file  Occupational History  . Occupation: retired iAdministrator, [employer] Service   Comment: due to coronary disease in 2000  . Occupation: retired c[employer]    Employer: RETIRED  Tobacco Use  . Smoking status: Former Smoker    Years: 28.00    Types: Pipe, Cigars    Last attempt to quit: 12/25/1995    Years since quitting: 22.1  . Smokeless tobacco: Never Used  Substance and Sexual Activity  . Alcohol use: No    Comment: 3-5 beer or wine per week  . Drug use: No  .  Sexual activity: Not on file  Other Topics Concern  . Not on file  Social History Narrative  . Not on file    FAMILY HISTORY: Family History  Problem Relation Age of Onset  . Heart attack Father   . Stroke Father   . Stroke Mother   . Allergies Unknown   . Asthma Unknown   . Heart disease Unknown   . Cancer Unknown     ALLERGIES:  is allergic to actos [pioglitazone hydrochloride]; codeine; depakote [divalproex sodium]; diltiazem; hydralazine; isosorbide dinitrate; januvia [sitagliptin]; loop diuretics; losartan; nsaids; onglyza [saxagliptin]; orudis [ketoprofen]; penicillins; potassium iodide; rozerem [ramelteon]; amiodarone; latex; verapamil; benazepril hcl; gentamycin [gentamicin]; indomethacin; minocycline; pentazocine lactate; poison ivy extract [poison ivy extract]; sertraline hcl; and shellfish allergy.  MEDICATIONS:  Current Outpatient Medications  Medication Sig Dispense Refill  . acetaminophen (TYLENOL) 500 MG tablet Take 1,000 mg by mouth 2 (two) times daily as needed (pain).     [name] allopurinol (ZYLOPRIM) 100 MG tablet Take 100 mg by mouth 2 (two) times daily.    . cetirizine (ZYRTEC) 10 MG tablet Take 10 mg by mouth daily as needed for allergies.     . diazepam (VALIUM) 10 MG  tablet Take 10 mg by mouth as needed (Muscle cramps).     . metoprolol tartrate (LOPRESSOR) 50 MG tablet TAKE ONE TABLET BY MOUTH TWICE A DAY 60 tablet 11  . terazosin (HYTRIN) 5 MG capsule Take 1 capsule (5 mg total) by mouth every morning. 30 capsule 6  . terazosin (HYTRIN) 5 MG capsule TAKE ONE CAPSULE BY MOUTH EVERY MORNING 90 capsule 3  . Testosterone Cypionate 200 MG/ML KIT Inject 1 mL into the muscle every 14 (fourteen) days.    [name] torsemide (DEMADEX) 20 MG tablet Take 2 tablets (40 mg total) by mouth every morning. 30 tablet 1  . traMADol (ULTRAM) 50 MG tablet Take by mouth every 6 (six) hours as needed for moderate pain.    [name] warfarin (COUMADIN) 1 MG tablet Take 1 tablet (1 mg total) by mouth daily. (Patient taking differently: Take 1 mg by mouth daily. 2 mg mon, tues, wed, fri and 1 mg sun, thurs, sat) 30 tablet 1  . XIFAXAN 550 MG TABS tablet     . zolpidem (AMBIEN) 10 MG tablet Take 1 tablet (10 mg total) by mouth at bedtime as needed for sleep. 20 tablet 0   No current facility-administered medications for this visit.     REVIEW OF SYSTEMS:    10 Point review of Systems was done is negative except as noted above.  PHYSICAL EXAMINATION:  . Vitals:   02/27/18 1319  BP: (!) 165/73  Pulse: 60  Resp: 18  Temp: 97.8 F (36.6 C)  SpO2: 96%   Filed Weights   02/27/18 1319  Weight: 236 lb 3.2 oz (107.1 kg)   .Body mass index is 32.94 kg/m.  GENERAL:alert, in no acute distress and comfortable SKIN: no acute rashes, no significant lesions EYES: conjunctiva are pink and non-injected, sclera anicteric OROPHARYNX: MMM, no exudates, no oropharyngeal erythema or ulceration NECK: supple, mild JVD LYMPH:  no palpable lymphadenopathy in the cervical, axillary or inguinal regions LUNGS: clear to auscultation b/l with normal respiratory effort HEART: irreg ABDOMEN:  normoactive bowel sounds , non tender, not distended. No palpable hepato-splenomegaly. Extremity: no pedal  edema PSYCH: alert & oriented x 3 with fluent speech NEURO: no focal motor/sensory deficits  LABORATORY DATA:  I have reviewed the data as listed  . CBC  Latest Ref Rng & Units 02/27/2018 04/26/2017 04/21/2017  WBC 4.0 - 10.3 K/uL 6.3 2.8(L) 3.6(L)  Hemoglobin 13.0 - 17.1 g/dL 17.1 13.4 12.8(L)  Hematocrit 38.4 - 49.9 % 48.4 40.1 38.6(L)  Platelets 140 - 400 K/uL 64(L) 125(L) 116(L)    . CMP Latest Ref Rng & Units 02/27/2018 04/26/2017 04/22/2017  Glucose 70 - 140 mg/dL 192(H) 155(H) 161(H)  BUN 7 - 26 mg/dL 69(H) 39(H) 45(H)  Creatinine 0.70 - 1.30 mg/dL 4.92(HH) 2.21(H) 2.42(H)  Sodium 136 - 145 mmol/L 125(L) 136 137  Potassium 3.5 - 5.1 mmol/L 4.1 3.0(L) 4.2  Chloride 98 - 109 mmol/L 90(L) 98(L) 103  CO2 22 - 29 mmol/L 26 29 27   Calcium 8.4 - 10.4 mg/dL 8.9 8.0(L) 8.5(L)  Total Protein 6.4 - 8.3 g/dL 6.4 5.5(L) -  Total Bilirubin 0.2 - 1.2 mg/dL 0.8 1.4(H) -  Alkaline Phos 40 - 150 U/L 77 109 -  AST 5 - 34 U/L 22 23 -  ALT 0 - 55 U/L 18 12(L) -   [facility] Hgb Lab Data 02/07/18:    RADIOGRAPHIC STUDIES: I have personally reviewed the radiological images as listed and agreed with the findings in the report. Ct Abdomen Pelvis Wo Contrast  Result Date: 02/07/2018 CLINICAL DATA:  Suprapubic pain EXAM: CT ABDOMEN AND PELVIS WITHOUT CONTRAST TECHNIQUE: Multidetector CT imaging of the abdomen and pelvis was performed following the standard protocol without IV contrast. COMPARISON:  06/12/2017 FINDINGS: Lower chest: Lung bases show no focal infiltrate or sizable effusion. Multiple hilar and mediastinal calcified lymph nodes are noted consistent with prior granulomatous disease. Hepatobiliary: Mild cirrhotic change of the liver is again identified and stable. Cholelithiasis is seen without complicating factors. Pancreas: Unremarkable. No pancreatic ductal dilatation or surrounding inflammatory changes. Spleen: Normal in size without focal abnormality. Adrenals/Urinary Tract: The  adrenal glands are within normal limits. The kidneys demonstrate cystic change on the left stable from the prior exam. An exophytic cyst is noted arising from the inferior aspect of the right kidney posteriorly which has increased in size from the prior exam. It now measures approximately 4.5 cm. No obstructive changes are noted. The bladder is partially distended. Stomach/Bowel: Scattered diverticular changes noted. No evidence of diverticulitis is seen. The appendix is within normal limits. Vascular/Lymphatic: Aortic atherosclerosis. No enlarged abdominal or pelvic lymph nodes. Reproductive: Prostate is unremarkable. Other: Very minimal free fluid and free air is noted within the abdominal cavity consistent with the peritoneal dialysis catheter. The dialysis catheter is centered in the right lower pelvis. No findings to suggest tract infection are seen. Musculoskeletal: Degenerative changes of lumbar spine are noted. IMPRESSION: Minimal free fluid and free air within the abdomen related to the peritoneal dialysis catheter. Enlarging right renal cyst as described. No acute abnormality is noted. Electronically Signed   By: Inez Catalina M.D.   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 Elevated Hemoglobin -Discussed patient's most recent labs,  -His slightly elevated Hgb at 17.1 with HCT today not significantly elevated at 48.4 (recently HCT was 50-51) -hgb levels progressively have been increased even though the patient CKD and one would except some degree of Anemia due to EPO deficiency. -Polycythemia appears to be related to hemoconcentration from aggressive fluid removal with Peritoneal dialysis. Less likely to be Polycythemia vera in the setting of no splenomegaly. No associated thrombocytosis or leucocytosis.  PLAN -will get labs today to r/o polycythemia vera and clonal  processes. -no EPO ordered since in the setting of ESRD would not be a good marker to  evaluate Polycythemia and would be expected to be low. -Jak2 V617F and Jak2 Exon 12 mutation studies. If these are negative would reduce change of PCV to <1% -f/u with nephrology to optimize Peritoneal dialysis in the setting of diastolic CHF, hypoantremia and other symptoms.  2. Thrombocytopenia - chronic. PLT 60-70K. No issues with bleeding. ? Related to medication - diuretics/allopurinol ? Congestive hepatopathy -peritoneal dialysis .  3.Hyponatremia - f/u ith nephrology/PCP   Labs today RTC with Dr Irene Limbo based on labs   All of the patients questions were answered with apparent satisfaction. The patient knows to call the clinic with any problems, questions or concerns.  I spent 30 minutes counseling the patient face to face. The total time spent in the appointment was 35 minutes and more than 50% was on counseling and direct patient cares.    Sullivan Lone MD [place] AAHIVMS [facility] Hematology/Oncology Physician [facility]  (Office):       [phone] (Work cell):  [phone] (Fax):           [phone]  02/27/2018 12:57 PM  This document serves as a record of services personally performed by Sullivan Lone, MD. It was created on his behalf by Baldwin Jamaica, a trained medical scribe. The creation of this record is based on the scribe's personal observations and the provider's statements to them.   .I have reviewed the above documentation for accuracy and completeness, and I agree with the above. Brunetta Genera MD MS

## 2018-02-27 ENCOUNTER — Inpatient Hospital Stay: Payer: Medicare Other | Attending: Hematology | Admitting: Hematology

## 2018-02-27 ENCOUNTER — Inpatient Hospital Stay: Payer: Medicare Other

## 2018-02-27 VITALS — BP 165/73 | HR 60 | Temp 97.8°F | Resp 18 | Ht 71.0 in | Wt 236.2 lb

## 2018-02-27 DIAGNOSIS — D869 Sarcoidosis, unspecified: Secondary | ICD-10-CM | POA: Insufficient documentation

## 2018-02-27 DIAGNOSIS — I5030 Unspecified diastolic (congestive) heart failure: Secondary | ICD-10-CM | POA: Insufficient documentation

## 2018-02-27 DIAGNOSIS — Z992 Dependence on renal dialysis: Secondary | ICD-10-CM | POA: Diagnosis not present

## 2018-02-27 DIAGNOSIS — G4730 Sleep apnea, unspecified: Secondary | ICD-10-CM | POA: Diagnosis not present

## 2018-02-27 DIAGNOSIS — D751 Secondary polycythemia: Secondary | ICD-10-CM

## 2018-02-27 DIAGNOSIS — N186 End stage renal disease: Secondary | ICD-10-CM | POA: Insufficient documentation

## 2018-02-27 DIAGNOSIS — I700 Atherosclerosis of aorta: Secondary | ICD-10-CM | POA: Insufficient documentation

## 2018-02-27 DIAGNOSIS — N281 Cyst of kidney, acquired: Secondary | ICD-10-CM | POA: Insufficient documentation

## 2018-02-27 DIAGNOSIS — Z87891 Personal history of nicotine dependence: Secondary | ICD-10-CM

## 2018-02-27 DIAGNOSIS — I252 Old myocardial infarction: Secondary | ICD-10-CM | POA: Insufficient documentation

## 2018-02-27 DIAGNOSIS — I129 Hypertensive chronic kidney disease with stage 1 through stage 4 chronic kidney disease, or unspecified chronic kidney disease: Secondary | ICD-10-CM | POA: Diagnosis not present

## 2018-02-27 DIAGNOSIS — F419 Anxiety disorder, unspecified: Secondary | ICD-10-CM | POA: Insufficient documentation

## 2018-02-27 DIAGNOSIS — Z7901 Long term (current) use of anticoagulants: Secondary | ICD-10-CM

## 2018-02-27 DIAGNOSIS — K769 Liver disease, unspecified: Secondary | ICD-10-CM | POA: Insufficient documentation

## 2018-02-27 DIAGNOSIS — E1122 Type 2 diabetes mellitus with diabetic chronic kidney disease: Secondary | ICD-10-CM | POA: Insufficient documentation

## 2018-02-27 DIAGNOSIS — Z79899 Other long term (current) drug therapy: Secondary | ICD-10-CM | POA: Insufficient documentation

## 2018-02-27 DIAGNOSIS — E871 Hypo-osmolality and hyponatremia: Secondary | ICD-10-CM | POA: Insufficient documentation

## 2018-02-27 DIAGNOSIS — I4891 Unspecified atrial fibrillation: Secondary | ICD-10-CM | POA: Diagnosis not present

## 2018-02-27 DIAGNOSIS — I350 Nonrheumatic aortic (valve) stenosis: Secondary | ICD-10-CM | POA: Diagnosis not present

## 2018-02-27 LAB — CMP (CANCER CENTER ONLY)
ALT: 18 U/L (ref 0–55)
ANION GAP: 9 (ref 3–11)
AST: 22 U/L (ref 5–34)
Albumin: 3 g/dL — ABNORMAL LOW (ref 3.5–5.0)
Alkaline Phosphatase: 77 U/L (ref 40–150)
BUN: 69 mg/dL — ABNORMAL HIGH (ref 7–26)
CHLORIDE: 90 mmol/L — AB (ref 98–109)
CO2: 26 mmol/L (ref 22–29)
Calcium: 8.9 mg/dL (ref 8.4–10.4)
Creatinine: 4.92 mg/dL (ref 0.70–1.30)
GFR, EST AFRICAN AMERICAN: 12 mL/min — AB (ref >60)
GFR, EST NON AFRICAN AMERICAN: 10 mL/min — AB (ref >60)
Glucose, Bld: 192 mg/dL — ABNORMAL HIGH (ref 70–140)
POTASSIUM: 4.1 mmol/L (ref 3.5–5.1)
Sodium: 125 mmol/L — ABNORMAL LOW (ref 136–145)
Total Bilirubin: 0.8 mg/dL (ref 0.2–1.2)
Total Protein: 6.4 g/dL (ref 6.4–8.3)

## 2018-02-27 LAB — CBC WITH DIFFERENTIAL/PLATELET
Basophils Absolute: 0 10*3/uL (ref 0.0–0.1)
Basophils Relative: 0 %
EOS ABS: 0.1 10*3/uL (ref 0.0–0.5)
Eosinophils Relative: 1 %
HCT: 48.4 % (ref 38.4–49.9)
HEMOGLOBIN: 17.1 g/dL (ref 13.0–17.1)
LYMPHS PCT: 15 %
Lymphs Abs: 0.9 10*3/uL (ref 0.9–3.3)
MCH: 31.7 pg (ref 27.2–33.4)
MCHC: 35.3 g/dL (ref 32.0–36.0)
MCV: 89.6 fL (ref 79.3–98.0)
Monocytes Absolute: 0.3 10*3/uL (ref 0.1–0.9)
Monocytes Relative: 5 %
NEUTROS PCT: 79 %
Neutro Abs: 5 10*3/uL (ref 1.5–6.5)
Platelets: 64 10*3/uL — ABNORMAL LOW (ref 140–400)
RBC: 5.4 MIL/uL (ref 4.20–5.82)
RDW: 15.4 % — ABNORMAL HIGH (ref 11.0–14.6)
WBC: 6.3 10*3/uL (ref 4.0–10.3)

## 2018-02-27 LAB — RETICULOCYTES
RBC.: 5.4 MIL/uL (ref 4.20–5.82)
RETIC CT PCT: 1.7 % (ref 0.8–1.8)
Retic Count, Absolute: 91.8 10*3/uL (ref 34.8–93.9)

## 2018-03-12 LAB — JAK2 (INCLUDING V617F AND EXON 12), MPL,& CALR-NEXT GEN SEQ

## 2018-03-17 ENCOUNTER — Other Ambulatory Visit: Payer: Self-pay | Admitting: Family Medicine

## 2018-03-17 ENCOUNTER — Ambulatory Visit (HOSPITAL_BASED_OUTPATIENT_CLINIC_OR_DEPARTMENT_OTHER)
Admission: RE | Admit: 2018-03-17 | Discharge: 2018-03-17 | Disposition: A | Discharge status: Home / Self Care | Payer: Medicare Other | Source: Ambulatory Visit | Attending: Family Medicine | Admitting: Family Medicine

## 2018-03-17 DIAGNOSIS — M79662 Pain in left lower leg: Secondary | ICD-10-CM | POA: Insufficient documentation

## 2018-03-17 DIAGNOSIS — M7989 Other specified soft tissue disorders: Secondary | ICD-10-CM

## 2018-03-26 ENCOUNTER — Encounter (HOSPITAL_BASED_OUTPATIENT_CLINIC_OR_DEPARTMENT_OTHER): Payer: Medicare Other

## 2018-04-21 ENCOUNTER — Other Ambulatory Visit (HOSPITAL_COMMUNITY): Payer: Medicare Other

## 2018-04-21 ENCOUNTER — Telehealth (HOSPITAL_COMMUNITY): Payer: Self-pay | Admitting: Cardiology

## 2018-04-21 NOTE — Telephone Encounter (Signed)
Patient and patients wife called to report increased in BLE edema Reports new blisters have started about ~ 2 days ago, so feels the swelling is secondary to increase in fluid. Denies increased SOB, weight stable   Currently taking torsemide 40 daily (he did double to 80 mg for a few days) Will see PCP 04/21/18, as the have been managing wounds  Patient is ok to see Rebecca Eaton 4/30 @3   Will forward to provider for further instructions if needed prior to appt

## 2018-04-21 NOTE — Telephone Encounter (Signed)
No further instructions

## 2018-04-22 ENCOUNTER — Encounter (HOSPITAL_COMMUNITY): Payer: Medicare Other

## 2018-04-24 ENCOUNTER — Other Ambulatory Visit: Payer: Self-pay

## 2018-04-24 DIAGNOSIS — I872 Venous insufficiency (chronic) (peripheral): Secondary | ICD-10-CM

## 2018-04-25 ENCOUNTER — Encounter: Payer: Self-pay | Admitting: Vascular Surgery

## 2018-04-25 ENCOUNTER — Ambulatory Visit (INDEPENDENT_AMBULATORY_CARE_PROVIDER_SITE_OTHER): Payer: Medicare Other | Admitting: Vascular Surgery

## 2018-04-25 ENCOUNTER — Other Ambulatory Visit: Payer: Self-pay

## 2018-04-25 ENCOUNTER — Ambulatory Visit (HOSPITAL_COMMUNITY)
Admission: RE | Admit: 2018-04-25 | Discharge: 2018-04-25 | Disposition: A | Discharge status: Home / Self Care | Payer: Medicare Other | Source: Ambulatory Visit | Attending: Vascular Surgery | Admitting: Vascular Surgery

## 2018-04-25 VITALS — BP 144/82 | HR 52 | Temp 97.0°F | Resp 18 | Ht 71.0 in | Wt 227.0 lb

## 2018-04-25 DIAGNOSIS — N186 End stage renal disease: Secondary | ICD-10-CM

## 2018-04-25 DIAGNOSIS — I872 Venous insufficiency (chronic) (peripheral): Secondary | ICD-10-CM | POA: Diagnosis not present

## 2018-04-25 DIAGNOSIS — Z992 Dependence on renal dialysis: Secondary | ICD-10-CM | POA: Diagnosis not present

## 2018-04-25 NOTE — Progress Notes (Signed)
Vitals:   04/25/18 1402  BP: (!) 151/81  Pulse: (!) 52  Resp: 18  Temp: (!) 97 F (36.1 C)  TempSrc: Oral  SpO2: 94%  Weight: 227 lb (103 kg)  Height: 5\' 11"  (1.803 m)

## 2018-04-25 NOTE — Progress Notes (Signed)
Patient ID: [name] Fiddler, MD, male   DOB: 03-06-1941, 77 y.o.   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  He now has wounds on his bilateral lower extremities that are consistent with venous wounds.  He also has skin changes also consistent with venous disease.  He has congestive heart failure and is not anticoagulated.  Since going on peritoneal dialysis his fluid in his leg has improved but he is now having significant drainage from 2 ulcers on the right side and one on the left.  He has not had lower extremity intervention on his heart arteries.  He has bilateral saphenous vein harvest for intervention on his heart.  He presents today for evaluation of his wounds are currently being taken care of by his primary care doctor as well as his wife.   Past Medical History:  Diagnosis Date  . A-fib ([place])    permanent with tachy-brady syndrome  . Anemia   . Anxiety   . Atrial flutter (HCC)    hx of  . Biceps tendon tear    right  . Bone marrow disease   . CHF (congestive heart failure) ([place])   . Chronic kidney disease (CKD), stage V (HCC)      colodonato, Peritoneal dialysis  . Complication of anesthesia 11-16-13   required alot of versed per anesthesia with cataract surgery  . Coronary heart disease    stent, CABG, RBBB  . DDD (degenerative disc disease)   . Diabetes ([place])   . Diabetes mellitus   . Fungal toenail infection   . Gout   . Hemorrhagic cystitis 2012  . Hepatitis    mono  . HTN (hypertension)   . Hypercalcemia 2011   due to sarcoidiosis  . Myocardial infarction [facility])    1995, 2002, 2006  . OSA (obstructive sleep apnea)    use bipap setting of 10 and 12  . Osteoarthritis   . Pneumonia 1966, 2011   hx of  .  Presence of permanent cardiac pacemaker   . Right sciatic nerve pain    for block thursday 01-21-2014, injections  . Sarcoid 2011   pulmonary and bone marrow  . Shortness of breath dyspnea   . Synovial cyst of lumbar spine    l3-l4, l4-l5, injections  . Valvular heart disease    aortic stenosis/regurgitation   Family History  Problem Relation Age of Onset  . Heart attack Father   . Stroke Father   . Stroke Mother   . Allergies Unknown   . Asthma Unknown   . Heart disease Unknown   . Cancer Unknown    Past Surgical History:  Procedure Laterality Date  . BASAL CELL CARCINOMA EXCISION Right 2015   ear  . CARDIAC CATHETERIZATION N/A 12/10/2016   Procedure: Right Heart Cath;  Surgeon: Jolaine Artist, MD;  Location: [place] [facility];  Service: Cardiovascular;  Laterality: N/A;  . CARDIAC VALVE REPLACEMENT    . CARDIOVERSION N/A 12/30/2013   Procedure: CARDIOVERSION;  Surgeon: [name], MD;  Location: [facility] ENDOSCOPY;  Service: Cardiovascular;  Laterality: N/A;  10:49 cardioversion at 120 joules, then 150 joules, to SB  used  Lido 64m,  Propofol 160 mcg  .  CARDIOVERSION  2003, 2006, 2012, 2013  . cataract surgery Right 2007  . cataract surgery Left 11-16-13  . COLONOSCOPY N/A 01/29/2014   Procedure: COLONOSCOPY;  Surgeon: Winfield Cunas., MD;  Location: [facility];  Service: Endoscopy;  Laterality: N/A;  amanda//ja  . CORONARY ARTERY BYPASS GRAFT  2006   x 5  . EP IMPLANTABLE DEVICE N/A 05/05/2015   Procedure: Pacemaker Implant;  Surgeon: Thompson Grayer, MD;  Location: [place] [facility];  Service: Cardiovascular;  Laterality: N/A;  . FLEXIBLE SIGMOIDOSCOPY N/A 12/07/2015   Procedure: FLEXIBLE SIGMOIDOSCOPY;  Surgeon: Wilford Corner, MD;  Location[facility];  Service: Endoscopy;  Laterality: N/A;  Unprepped  . HERNIA REPAIR    . INSERT / REPLACE / REMOVE PACEMAKER  05/05/2015  . PORTACATH PLACEMENT    . portacath removed    . Redo Median sternotomy, extracorporeal  cirulation, AVR, Tricuspid valve repair  06/13/2011   AVR(23-mm Edwards pericardial Magna-Ease valve./ TVrepair (34-mm Edwards MC3 annuloplasty ring  . TOTAL KNEE ARTHROPLASTY Right 05/24/2014   Procedure: RIGHT TOTAL KNEE ARTHROPLASTY;  Surgeon: Gearlean Alf, MD;  Location: [facility] ORS;  Service: Orthopedics;  Laterality: Right;  . TRANSURETHRAL RESECTION OF PROSTATE  oct. 2012   TURP  . VASECTOMY  1977    Short Social History:  Social History   Tobacco Use  . Smoking status: Former Smoker    Years: 28.00    Types: Pipe, Cigars    Last attempt to quit: 12/25/1995    Years since quitting: 22.3  . Smokeless tobacco: Never Used  Substance Use Topics  . Alcohol use: No    Comment: 3-5 beer or wine per week    Allergies  Allergen Reactions  . Actos [Pioglitazone Hydrochloride] Swelling and Other (See Comments)    Severe peripheral edema  . Codeine Other (See Comments)    Makes Pt aggressive  . Depakote [Divalproex Sodium] Diarrhea    severe  . Diltiazem Other (See Comments)    lethargy and dyspnea  . Hydralazine Other (See Comments)    Severe chills and SOB   . Isosorbide Dinitrate Other (See Comments)    Severe chills and SOB   . Januvia [Sitagliptin] Diarrhea and Other (See Comments)    bradycardia  . Latex Swelling, Anaphylaxis and Hives  . Loop Diuretics Other (See Comments)    Spike in BUN and creatine levels  . Losartan Other (See Comments)    aphasia  . Nsaids Other (See Comments)    Renal problems  . Onglyza [Saxagliptin] Diarrhea and Other (See Comments)    bradycardia  . Orudis [Ketoprofen] Other (See Comments)    Cannot take due to renal insufficiency  . Penicillins Swelling and Other (See Comments)    Serum sickness Has patient had a PCN reaction causing immediate rash, facial/tongue/throat swelling, SOB or lightheadedness with hypotension: Yes Has patient had a PCN reaction causing severe rash involving mucus membranes or skin necrosis: No Has patient had a  PCN reaction that required hospitalization Yes Has patient had a PCN reaction occurring within the last 10 years: No If all of the above answers are "NO", then may proceed with Cephalosporin use.   [name] Potassium Iodide Itching and Rash    Severe entire body itching and rash  . Rozerem [Ramelteon] Diarrhea    Severe   . Amiodarone Other (See Comments)    diarrhea  . Verapamil Other (See Comments)    Severe fatigue  . Benazepril Hcl Other (See Comments)    ?  Possible lowers platelets  . Gentamycin [Gentamicin] Rash  . Indomethacin Other (See Comments)    Renal problems   . Minocycline Other (See Comments)    Makes dizzy and felt just lousy.  . Other Rash  . Pentazocine Lactate Other (See Comments)    Unknown allergic reaction - pt and wife do not recall this  . Poison Ivy Extract [Poison Ivy Extract] Rash and Other (See Comments)    blisters  . Sertraline Hcl Other (See Comments)    Pt and wife do not recall this  . Shellfish Allergy Rash    Current Outpatient Medications  Medication Sig Dispense Refill  . acetaminophen (TYLENOL) 500 MG tablet Take 1,000 mg by mouth 2 (two) times daily as needed (pain).     [name] allopurinol (ZYLOPRIM) 100 MG tablet Take 100 mg by mouth 2 (two) times daily.    . cetirizine (ZYRTEC) 10 MG tablet Take 10 mg by mouth daily as needed for allergies.     . diazepam (VALIUM) 10 MG tablet Take 10 mg by mouth as needed (Muscle cramps).     . metoprolol tartrate (LOPRESSOR) 50 MG tablet TAKE ONE TABLET BY MOUTH TWICE A DAY 60 tablet 11  . terazosin (HYTRIN) 5 MG capsule Take 1 capsule (5 mg total) by mouth every morning. 30 capsule 6  . terazosin (HYTRIN) 5 MG capsule TAKE ONE CAPSULE BY MOUTH EVERY MORNING 90 capsule 3  . Testosterone Cypionate 200 MG/ML KIT Inject 1 mL into the muscle every 14 (fourteen) days.    [name] torsemide (DEMADEX) 20 MG tablet Take 2 tablets (40 mg total) by mouth every morning. 30 tablet 1  . traMADol (ULTRAM) 50 MG tablet Take by  mouth every 6 (six) hours as needed for moderate pain.    [name] warfarin (COUMADIN) 1 MG tablet Take 1 tablet (1 mg total) by mouth daily. (Patient taking differently: Take 1 mg by mouth daily. 2 mg mon, tues, wed, fri and 1 mg sun, thurs, sat) 30 tablet 1  . XIFAXAN 550 MG TABS tablet     . zolpidem (AMBIEN) 10 MG tablet Take 1 tablet (10 mg total) by mouth at bedtime as needed for sleep. 20 tablet 0   No current facility-administered medications for this visit.     Review of Systems  Constitutional:  Constitutional negative. HENT: HENT negative.  Eyes: Eyes negative.  Respiratory: Positive for shortness of breath.  Cardiovascular: Positive for leg swelling.  GI: Gastrointestinal negative.  Musculoskeletal: Positive for leg pain.  Skin: Positive for wound.  Neurological: Neurological negative. Hematologic: Hematologic/lymphatic negative.  Psychiatric: Psychiatric negative.        Objective:  Objective   Vitals:   04/25/18 1402 04/25/18 1407  BP: (!) 151/81 (!) 144/82  Pulse: (!) 52 (!) 52  Resp: 18   Temp: (!) 97 F (36.1 C)   TempSrc: Oral   SpO2: 94%   Weight: 227 lb (103 kg)   Height: 5' 11"  (1.803 m)    Body mass index is 31.66 kg/m.  Physical Exam  Constitutional: He appears well-developed.  HENT:  Head: Normocephalic.  Eyes: Pupils are equal, round, and reactive to light.  Neck: Normal range of motion.  Cardiovascular: Normal rate.  Pulses:      Femoral pulses are 2+ on the right side, and 2+ on the left side.      Popliteal pulses are 0 on the right side, and 0 on the left side.  Pulmonary/Chest: Effort normal.  Abdominal: Soft.  Musculoskeletal: Normal range of motion. He exhibits edema.  Neurological: He is alert.  Skin: Skin is warm and dry.  Psychiatric: He has a normal mood and affect. His behavior is normal. Judgment and thought content normal.    Data: I have interpreted his bilateral lower extremity venous reflux study which demonstrates  abnormal reflux in the right femoral vein and greater saphenous vein that measures 0.5 cm but is absent below the knee.  On the left there is reflux in the great saphenous vein in the proximal calf measuring 0.49 cm.     Assessment/Plan:    77 year old male presents for evaluation bilateral lower extremity wounds with C6 venous disease although likely has multifactorial disease.  We will refer him to the wound clinic for consideration of compression wrap therapy since he cannot wear compression stockings due to skin irritation.  He has been able to get his wounds healed but cannot keep them healed and unfortunately does not appear to have any saphenous veins that can be intervened upon that could help with this.  We will get bilateral lower extremity duplexes as well as there is likely an arterial component that may help.  He demonstrates good understanding we will get him scheduled to follow-up in a few weeks.       Waynetta Sandy MD [facility]

## 2018-05-01 ENCOUNTER — Other Ambulatory Visit: Payer: Self-pay

## 2018-05-01 DIAGNOSIS — I872 Venous insufficiency (chronic) (peripheral): Secondary | ICD-10-CM

## 2018-05-08 ENCOUNTER — Encounter (HOSPITAL_BASED_OUTPATIENT_CLINIC_OR_DEPARTMENT_OTHER): Payer: Medicare Other | Attending: Internal Medicine

## 2018-05-08 DIAGNOSIS — E11622 Type 2 diabetes mellitus with other skin ulcer: Secondary | ICD-10-CM | POA: Diagnosis not present

## 2018-05-08 DIAGNOSIS — Z992 Dependence on renal dialysis: Secondary | ICD-10-CM | POA: Diagnosis not present

## 2018-05-08 DIAGNOSIS — L97822 Non-pressure chronic ulcer of other part of left lower leg with fat layer exposed: Secondary | ICD-10-CM | POA: Diagnosis not present

## 2018-05-08 DIAGNOSIS — E1151 Type 2 diabetes mellitus with diabetic peripheral angiopathy without gangrene: Secondary | ICD-10-CM | POA: Diagnosis not present

## 2018-05-08 DIAGNOSIS — I509 Heart failure, unspecified: Secondary | ICD-10-CM | POA: Insufficient documentation

## 2018-05-08 DIAGNOSIS — I4891 Unspecified atrial fibrillation: Secondary | ICD-10-CM | POA: Insufficient documentation

## 2018-05-08 DIAGNOSIS — L97812 Non-pressure chronic ulcer of other part of right lower leg with fat layer exposed: Secondary | ICD-10-CM | POA: Insufficient documentation

## 2018-05-08 DIAGNOSIS — G4733 Obstructive sleep apnea (adult) (pediatric): Secondary | ICD-10-CM | POA: Diagnosis not present

## 2018-05-08 DIAGNOSIS — N186 End stage renal disease: Secondary | ICD-10-CM | POA: Diagnosis not present

## 2018-05-08 DIAGNOSIS — I132 Hypertensive heart and chronic kidney disease with heart failure and with stage 5 chronic kidney disease, or end stage renal disease: Secondary | ICD-10-CM | POA: Diagnosis not present

## 2018-05-08 DIAGNOSIS — Z952 Presence of prosthetic heart valve: Secondary | ICD-10-CM | POA: Insufficient documentation

## 2018-05-08 DIAGNOSIS — I2510 Atherosclerotic heart disease of native coronary artery without angina pectoris: Secondary | ICD-10-CM | POA: Insufficient documentation

## 2018-05-08 DIAGNOSIS — E1122 Type 2 diabetes mellitus with diabetic chronic kidney disease: Secondary | ICD-10-CM | POA: Diagnosis not present

## 2018-05-08 DIAGNOSIS — I252 Old myocardial infarction: Secondary | ICD-10-CM | POA: Diagnosis not present

## 2018-05-09 DIAGNOSIS — E11622 Type 2 diabetes mellitus with other skin ulcer: Secondary | ICD-10-CM | POA: Diagnosis not present

## 2018-05-13 ENCOUNTER — Ambulatory Visit (INDEPENDENT_AMBULATORY_CARE_PROVIDER_SITE_OTHER): Payer: Medicare Other | Admitting: *Deleted

## 2018-05-13 DIAGNOSIS — I495 Sick sinus syndrome: Secondary | ICD-10-CM

## 2018-05-13 DIAGNOSIS — R001 Bradycardia, unspecified: Secondary | ICD-10-CM

## 2018-05-13 NOTE — Progress Notes (Signed)
Remote pacemaker transmission.   

## 2018-05-15 ENCOUNTER — Ambulatory Visit (INDEPENDENT_AMBULATORY_CARE_PROVIDER_SITE_OTHER)
Admission: RE | Admit: 2018-05-15 | Discharge: 2018-05-15 | Disposition: A | Discharge status: Home / Self Care | Payer: Medicare Other | Source: Ambulatory Visit | Attending: Vascular Surgery | Admitting: Vascular Surgery

## 2018-05-15 ENCOUNTER — Ambulatory Visit (HOSPITAL_COMMUNITY)
Admission: RE | Admit: 2018-05-15 | Discharge: 2018-05-15 | Disposition: A | Discharge status: Home / Self Care | Payer: Medicare Other | Source: Ambulatory Visit | Attending: Vascular Surgery | Admitting: Vascular Surgery

## 2018-05-15 DIAGNOSIS — I872 Venous insufficiency (chronic) (peripheral): Secondary | ICD-10-CM | POA: Diagnosis not present

## 2018-05-15 DIAGNOSIS — M25561 Pain in right knee: Secondary | ICD-10-CM | POA: Insufficient documentation

## 2018-05-15 DIAGNOSIS — M25562 Pain in left knee: Secondary | ICD-10-CM | POA: Diagnosis not present

## 2018-05-15 DIAGNOSIS — I252 Old myocardial infarction: Secondary | ICD-10-CM | POA: Insufficient documentation

## 2018-05-15 DIAGNOSIS — E1122 Type 2 diabetes mellitus with diabetic chronic kidney disease: Secondary | ICD-10-CM | POA: Insufficient documentation

## 2018-05-15 DIAGNOSIS — N186 End stage renal disease: Secondary | ICD-10-CM | POA: Diagnosis not present

## 2018-05-15 DIAGNOSIS — I2510 Atherosclerotic heart disease of native coronary artery without angina pectoris: Secondary | ICD-10-CM | POA: Diagnosis not present

## 2018-05-15 DIAGNOSIS — Z87891 Personal history of nicotine dependence: Secondary | ICD-10-CM | POA: Diagnosis not present

## 2018-05-15 DIAGNOSIS — Z951 Presence of aortocoronary bypass graft: Secondary | ICD-10-CM | POA: Diagnosis not present

## 2018-05-15 DIAGNOSIS — I120 Hypertensive chronic kidney disease with stage 5 chronic kidney disease or end stage renal disease: Secondary | ICD-10-CM | POA: Insufficient documentation

## 2018-05-15 LAB — CUP PACEART REMOTE DEVICE CHECK
Battery Remaining Percentage: 95.5 %
Battery Voltage: 2.99 V
Brady Statistic AS VS Percent: 2.6 %
Implantable Lead Implant Date: 20160512
Implantable Lead Location: 753859
Implantable Lead Location: 753860
Implantable Lead Model: 1948
Implantable Pulse Generator Implant Date: 20160512
Lead Channel Impedance Value: 480 Ohm
Lead Channel Pacing Threshold Pulse Width: 0.7 ms
Lead Channel Sensing Intrinsic Amplitude: 0.5 mV
Lead Channel Setting Pacing Amplitude: 2 V
Lead Channel Setting Pacing Pulse Width: 0.7 ms
MDC IDC LEAD IMPLANT DT: 20160512
MDC IDC MSMT BATTERY REMAINING LONGEVITY: 123 mo
MDC IDC MSMT LEADCHNL RV IMPEDANCE VALUE: 600 Ohm
MDC IDC MSMT LEADCHNL RV PACING THRESHOLD AMPLITUDE: 1.25 V
MDC IDC MSMT LEADCHNL RV SENSING INTR AMPL: 9.1 mV
MDC IDC PG SERIAL: 7759894
MDC IDC SESS DTM: 20190521060016
MDC IDC SET LEADCHNL RV PACING AMPLITUDE: 2.5 V
MDC IDC SET LEADCHNL RV SENSING SENSITIVITY: 2 mV
MDC IDC STAT BRADY AP VP PERCENT: 41 %
MDC IDC STAT BRADY AP VS PERCENT: 8.9 %
MDC IDC STAT BRADY AS VP PERCENT: 38 %
MDC IDC STAT BRADY RA PERCENT PACED: 1.2 %
MDC IDC STAT BRADY RV PERCENT PACED: 52 %

## 2018-05-16 DIAGNOSIS — E11622 Type 2 diabetes mellitus with other skin ulcer: Secondary | ICD-10-CM | POA: Diagnosis not present

## 2018-05-23 ENCOUNTER — Telehealth: Payer: Self-pay | Admitting: *Deleted

## 2018-05-23 ENCOUNTER — Other Ambulatory Visit: Payer: Self-pay | Admitting: *Deleted

## 2018-05-23 ENCOUNTER — Encounter: Payer: Self-pay | Admitting: *Deleted

## 2018-05-23 ENCOUNTER — Other Ambulatory Visit: Payer: Self-pay

## 2018-05-23 ENCOUNTER — Ambulatory Visit (INDEPENDENT_AMBULATORY_CARE_PROVIDER_SITE_OTHER): Payer: Medicare Other | Admitting: Vascular Surgery

## 2018-05-23 ENCOUNTER — Encounter: Payer: Self-pay | Admitting: Vascular Surgery

## 2018-05-23 VITALS — BP 126/79 | HR 54 | Temp 97.0°F | Resp 16 | Ht 71.0 in | Wt 231.9 lb

## 2018-05-23 DIAGNOSIS — Z992 Dependence on renal dialysis: Secondary | ICD-10-CM | POA: Diagnosis not present

## 2018-05-23 DIAGNOSIS — I872 Venous insufficiency (chronic) (peripheral): Secondary | ICD-10-CM

## 2018-05-23 DIAGNOSIS — N186 End stage renal disease: Secondary | ICD-10-CM

## 2018-05-23 DIAGNOSIS — I739 Peripheral vascular disease, unspecified: Secondary | ICD-10-CM | POA: Diagnosis not present

## 2018-05-23 NOTE — Progress Notes (Signed)
Patient ID: [name] Fiddler, MD, male   DOB: [birth year], 77 y.o.   MRN: [id number]  Reason for Consult: Chronic Kidney Disease (3 wk f/u, ABI, LE Art. 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  He has wounds on his bilateral lower extremities which have been coming and going for multiple months most recently he has had wounds on his leg for 7 weeks without healing.  He does have congestive heart failure.  Currently has 2 wounds on the right side and none on the left.  Last visit had saphenous vein reflux study which did not demonstrate any veins for intervention.  His vein certainly appear venous in nature.  He is attending the wound center where they have them under control at this time and he is wearing compression dressings at this time as well.  Past Medical History:  Diagnosis Date  . A-fib ([place])    permanent with tachy-brady syndrome  . Anemia   . Anxiety   . Atrial flutter (HCC)    hx of  . Biceps tendon tear    right  . Bone marrow disease   . CHF (congestive heart failure) ([place])   . Chronic kidney disease (CKD), stage V (HCC)      colodonato, Peritoneal dialysis  . Complication of anesthesia 11-16-13   required alot of versed per anesthesia with cataract surgery  . Coronary heart disease    stent, CABG, RBBB  . DDD (degenerative disc disease)   . Diabetes ([place])   . Diabetes mellitus   . Fungal toenail infection   . Gout   . Hemorrhagic cystitis 2012  . Hepatitis    mono  . HTN (hypertension)   . Hypercalcemia 2011   due to sarcoidiosis  . Myocardial infarction [facility])    1995, 2002, 2006  . OSA (obstructive sleep apnea)    use bipap setting of 10 and 12  . Osteoarthritis   . Pneumonia 1966, 2011   hx of  . Presence of permanent cardiac pacemaker   . Right sciatic nerve pain    for block thursday 01-21-2014,  injections  . Sarcoid 2011   pulmonary and bone marrow  . Shortness of breath dyspnea   . Synovial cyst of lumbar spine    l3-l4, l4-l5, injections  . Valvular heart disease    aortic stenosis/regurgitation   Family History  Problem Relation Age of Onset  . Heart attack Father   . Stroke Father   . Stroke Mother   . Allergies Unknown   . Asthma Unknown   . Heart disease Unknown   . Cancer Unknown    Past Surgical History:  Procedure Laterality Date  . BASAL CELL CARCINOMA EXCISION Right 2015   ear  . CARDIAC CATHETERIZATION N/A 12/10/2016   Procedure: Right Heart Cath;  Surgeon: Jolaine Artist, MD;  Location: [place] [facility] LAB;  Service: Cardiovascular;  Laterality: N/A;  . CARDIAC VALVE REPLACEMENT    . CARDIOVERSION N/A 12/30/2013   Procedure: CARDIOVERSION;  Surgeon: [name], MD;  Location: [facility] ENDOSCOPY;  Service: Cardiovascular;  Laterality: N/A;  10:49 cardioversion at 120 joules, then 150 joules, to SB  used  Lido 2m,  Propofol 160 mcg  . CARDIOVERSION  2003, 2006, 2012, 2013  . cataract surgery Right 2007  .  cataract surgery Left 11-16-13  . COLONOSCOPY N/A 01/29/2014   Procedure: COLONOSCOPY;  Surgeon: Winfield Cunas., MD;  Location: [facility];  Service: Endoscopy;  Laterality: N/A;  amanda//ja  . CORONARY ARTERY BYPASS GRAFT  2006   x 5  . EP IMPLANTABLE DEVICE N/A 05/05/2015   Procedure: Pacemaker Implant;  Surgeon: Thompson Grayer, MD;  Location: [place] CV LAB;  Service: Cardiovascular;  Laterality: N/A;  . FLEXIBLE SIGMOIDOSCOPY N/A 12/07/2015   Procedure: FLEXIBLE SIGMOIDOSCOPY;  Surgeon: Wilford Corner, MD;  Location: [facility] ENDOSCOPY;  Service: Endoscopy;  Laterality: N/A;  Unprepped  . HERNIA REPAIR    . INSERT / REPLACE / REMOVE PACEMAKER  05/05/2015  . PORTACATH PLACEMENT    . portacath removed    . Redo Median sternotomy, extracorporeal cirulation, AVR, Tricuspid valve repair  06/13/2011   AVR(23-mm Edwards pericardial Magna-Ease valve./  TVrepair (34-mm Edwards MC3 annuloplasty ring  . TOTAL KNEE ARTHROPLASTY Right 05/24/2014   Procedure: RIGHT TOTAL KNEE ARTHROPLASTY;  Surgeon: Gearlean Alf, MD;  Location: [facility];  Service: Orthopedics;  Laterality: Right;  . TRANSURETHRAL RESECTION OF PROSTATE  oct. 2012   TURP  . VASECTOMY  1977    Short Social History:  Social History   Tobacco Use  . Smoking status: Former Smoker    Years: 28.00    Types: Pipe, Cigars    Last attempt to quit: 12/25/1995    Years since quitting: 22.4  . Smokeless tobacco: Never Used  Substance Use Topics  . Alcohol use: No    Comment: 3-5 beer or wine per week    Allergies  Allergen Reactions  . Actos [Pioglitazone Hydrochloride] Swelling and Other (See Comments)    Severe peripheral edema  . Codeine Other (See Comments)    Makes Pt aggressive  . Depakote [Divalproex Sodium] Diarrhea    severe  . Diltiazem Other (See Comments)    lethargy and dyspnea  . Hydralazine Other (See Comments)    Severe chills and SOB   . Isosorbide Dinitrate Other (See Comments)    Severe chills and SOB   . Januvia [Sitagliptin] Diarrhea and Other (See Comments)    bradycardia  . Latex Swelling, Anaphylaxis and Hives  . Loop Diuretics Other (See Comments)    Spike in BUN and creatine levels  . Losartan Other (See Comments)    aphasia  . Nsaids Other (See Comments)    Renal problems  . Onglyza [Saxagliptin] Diarrhea and Other (See Comments)    bradycardia  . Orudis [Ketoprofen] Other (See Comments)    Cannot take due to renal insufficiency  . Penicillins Swelling and Other (See Comments)    Serum sickness Has patient had a PCN reaction causing immediate rash, facial/tongue/throat swelling, SOB or lightheadedness with hypotension: Yes Has patient had a PCN reaction causing severe rash involving mucus membranes or skin necrosis: No Has patient had a PCN reaction that required hospitalization Yes Has patient had a PCN reaction occurring within the  last 10 years: No If all of the above answers are "NO", then may proceed with Cephalosporin use.   [name] Potassium Iodide Itching and Rash    Severe entire body itching and rash  . Rozerem [Ramelteon] Diarrhea    Severe   . Amiodarone Other (See Comments)    diarrhea  . Verapamil Other (See Comments)    Severe fatigue  . Benazepril Hcl Other (See Comments)    ? Possible lowers platelets  . Gentamycin [Gentamicin] Rash  . Indomethacin Other (  See Comments)    Renal problems   . Minocycline Other (See Comments)    Makes dizzy and felt just lousy.  . Other Rash  . Pentazocine Lactate Other (See Comments)    Unknown allergic reaction - pt and wife do not recall this  . Poison Ivy Extract [Poison Ivy Extract] Rash and Other (See Comments)    blisters  . Sertraline Hcl Other (See Comments)    Pt and wife do not recall this  . Shellfish Allergy Rash    Current Outpatient Medications  Medication Sig Dispense Refill  . acetaminophen (TYLENOL) 500 MG tablet Take 1,000 mg by mouth 2 (two) times daily as needed (pain).     [name] allopurinol (ZYLOPRIM) 100 MG tablet Take 100 mg by mouth 2 (two) times daily.    . cetirizine (ZYRTEC) 10 MG tablet Take 10 mg by mouth daily as needed for allergies.     . diazepam (VALIUM) 10 MG tablet Take 10 mg by mouth as needed (Muscle cramps).     . metoprolol tartrate (LOPRESSOR) 50 MG tablet TAKE ONE TABLET BY MOUTH TWICE A DAY 60 tablet 11  . terazosin (HYTRIN) 5 MG capsule Take 1 capsule (5 mg total) by mouth every morning. 30 capsule 6  . terazosin (HYTRIN) 5 MG capsule TAKE ONE CAPSULE BY MOUTH EVERY MORNING 90 capsule 3  . Testosterone Cypionate 200 MG/ML KIT Inject 1 mL into the muscle every 14 (fourteen) days.    [name] torsemide (DEMADEX) 20 MG tablet Take 2 tablets (40 mg total) by mouth every morning. 30 tablet 1  . traMADol (ULTRAM) 50 MG tablet Take by mouth every 6 (six) hours as needed for moderate pain.    [name] warfarin (COUMADIN) 1 MG tablet Take 1  tablet (1 mg total) by mouth daily. (Patient taking differently: Take 1 mg by mouth daily. 2 mg mon, tues, wed, fri and 1 mg sun, thurs, sat) 30 tablet 1  . XIFAXAN 550 MG TABS tablet     . zolpidem (AMBIEN) 10 MG tablet Take 1 tablet (10 mg total) by mouth at bedtime as needed for sleep. 20 tablet 0   No current facility-administered medications for this visit.     Review of Systems  Constitutional:  Constitutional negative. HENT: HENT negative.  Eyes: Eyes negative.  Respiratory: Respiratory negative.  Cardiovascular: Cardiovascular negative.  Musculoskeletal: Positive for leg pain.  Skin: Positive for wound.  Hematologic: Hematologic/lymphatic negative.  Psychiatric: Psychiatric negative.        Objective:  Objective   Vitals:   05/23/18 1219  BP: 126/79  Pulse: (!) 54  Resp: 16  Temp: (!) 97 F (36.1 C)  TempSrc: Oral  SpO2: 97%  Weight: 231 lb 14.4 oz (105.2 kg)  Height: _0  (1.803 m)   Body mass index is 32.34 kg/m.  Physical Exam  Constitutional: He is oriented to person, place, and time. He appears well-developed.  HENT:  Head: Normocephalic.  Eyes: Pupils are equal, round, and reactive to light.  Neck: Normal range of motion.  Cardiovascular: Normal rate.  Pulses:      Radial pulses are 2+ on the right side, and 2+ on the left side.       Femoral pulses are 2+ on the right side, and 2+ on the left side. Pulmonary/Chest: Effort normal.  Abdominal: Soft.  Neurological: He is alert and oriented to person, place, and time.  Skin:  There are 2 small ulcers on the medial aspect of his right  leg and one on the medial aspect of his left leg.  He has chronic skin changes of his bilateral legs.    Data: I have reviewed his bilateral lower extremity duplexes which demonstrate monophasic waveforms on the right from the common femoral down the left and superficial femoral arteries down. His ABIs are noncompressible bilaterally     Assessment/Plan:      77 year old male with bilateral lower extremity ulcerations consistent with C6 venous disease although they are likely multifactorial especially given his monophasic waveforms bilaterally and his arteries.  He also has congestive heart failure that is a component to this.  He is getting good wound care for both his wife and the wound care center at this time.  I have offered him angiogram to first evaluate his right lower extremity from a left femoral approach and possible intervention.  At this time he wants to go home and call to schedule.  He will need to hold Coumadin for 72 hours prior to the procedure.     Waynetta Sandy MD [facility]

## 2018-05-23 NOTE — Telephone Encounter (Signed)
-----   Message from Willy Eddy, RN sent at 05/23/2018  2:41 PM EDT ----- Regarding: FW: coumadin hold /lovenox bridge   ----- Message ----- From: Willy Eddy, RN Sent: 05/23/2018   1:34 PM To: Thao P Le, DO Subject: coumadin hold /lovenox bridge                  Requesting clearance and lovenox bridging if indicated. Patient is scheduled for angiogram with Dr. Servando Snare on 05/31/2018. Will need to hold coumadin for 5 days pre-procedure. Patient instructed to expect your call. Thank you for your time. Becky RN

## 2018-05-26 ENCOUNTER — Telehealth: Payer: Self-pay | Admitting: *Deleted

## 2018-05-26 ENCOUNTER — Encounter: Payer: Self-pay | Admitting: *Deleted

## 2018-05-26 NOTE — Telephone Encounter (Signed)
SEE ENCOUNTER NOTE.

## 2018-05-26 NOTE — Progress Notes (Unsigned)
Message regarding Coumadin hold and possible Lovenox bridging if indicated and advise patient.  For procedure with Dr. Donzetta Matters on 05/31/2018 for Dr. Rikki Spearing. Spoke with [place] at office.

## 2018-05-27 ENCOUNTER — Telehealth: Payer: Self-pay | Admitting: Cardiovascular Disease

## 2018-05-27 NOTE — Telephone Encounter (Signed)
   Primary Cardiologist: Glori Bickers, MD  Chart reviewed as part of pre-operative protocol coverage.   Per our protocol, pre-op clearances for Advanced HF team patients are instead routed directly to their team to handle. Will remove from general APP pool.  Charlie Pitter, PA-C 05/27/2018, 1:55 PM

## 2018-05-27 NOTE — Telephone Encounter (Signed)
New message      [facility] Pre-operative Risk Assessment    Request for surgical clearance:  1. What type of surgery is being performed? ANGIOGRAM  2. When is this surgery scheduled? 06/17/2018  3. What type of clearance is required (medical clearance vs. Pharmacy clearance to hold med vs. Both)? BOTH  4. Are there any medications that need to be held prior to surgery and how long?  5. Practice name and name of physician performing surgery?Dr Marin Comment from [place]  6. What is your office phone number [phone]   7.   What is your office fax number [phone]  8.   Anesthesia type (None, local, MAC, general) ?    [place] 05/27/2018, 1:18 PM  _________________________________________________________________   (provider comments below)

## 2018-05-28 NOTE — Addendum Note (Signed)
Encounter addended by: Skeet Simmer, [facility] on: 05/28/2018 4:40 PM  Actions taken: Order Reconciliation Section accessed

## 2018-05-29 ENCOUNTER — Encounter (HOSPITAL_BASED_OUTPATIENT_CLINIC_OR_DEPARTMENT_OTHER): Payer: Medicare Other | Attending: Internal Medicine

## 2018-05-29 DIAGNOSIS — I87333 Chronic venous hypertension (idiopathic) with ulcer and inflammation of bilateral lower extremity: Secondary | ICD-10-CM | POA: Insufficient documentation

## 2018-05-29 DIAGNOSIS — E11622 Type 2 diabetes mellitus with other skin ulcer: Secondary | ICD-10-CM | POA: Diagnosis not present

## 2018-05-29 DIAGNOSIS — L97221 Non-pressure chronic ulcer of left calf limited to breakdown of skin: Secondary | ICD-10-CM | POA: Insufficient documentation

## 2018-05-29 DIAGNOSIS — N189 Chronic kidney disease, unspecified: Secondary | ICD-10-CM | POA: Diagnosis not present

## 2018-05-29 DIAGNOSIS — E1122 Type 2 diabetes mellitus with diabetic chronic kidney disease: Secondary | ICD-10-CM | POA: Diagnosis not present

## 2018-05-29 DIAGNOSIS — E1151 Type 2 diabetes mellitus with diabetic peripheral angiopathy without gangrene: Secondary | ICD-10-CM | POA: Diagnosis not present

## 2018-05-29 DIAGNOSIS — L97811 Non-pressure chronic ulcer of other part of right lower leg limited to breakdown of skin: Secondary | ICD-10-CM | POA: Diagnosis not present

## 2018-05-29 DIAGNOSIS — I252 Old myocardial infarction: Secondary | ICD-10-CM | POA: Diagnosis not present

## 2018-05-29 DIAGNOSIS — L97822 Non-pressure chronic ulcer of other part of left lower leg with fat layer exposed: Secondary | ICD-10-CM | POA: Insufficient documentation

## 2018-05-29 DIAGNOSIS — I509 Heart failure, unspecified: Secondary | ICD-10-CM | POA: Insufficient documentation

## 2018-05-29 DIAGNOSIS — I130 Hypertensive heart and chronic kidney disease with heart failure and stage 1 through stage 4 chronic kidney disease, or unspecified chronic kidney disease: Secondary | ICD-10-CM | POA: Diagnosis not present

## 2018-05-29 DIAGNOSIS — L97812 Non-pressure chronic ulcer of other part of right lower leg with fat layer exposed: Secondary | ICD-10-CM | POA: Diagnosis not present

## 2018-06-02 ENCOUNTER — Encounter (HOSPITAL_COMMUNITY): Payer: Self-pay

## 2018-06-02 ENCOUNTER — Telehealth: Payer: Self-pay | Admitting: Vascular Surgery

## 2018-06-02 ENCOUNTER — Inpatient Hospital Stay (HOSPITAL_COMMUNITY): Admission: RE | Disposition: E | Payer: Self-pay | Source: Home / Self Care | Attending: Vascular Surgery

## 2018-06-02 ENCOUNTER — Inpatient Hospital Stay (HOSPITAL_COMMUNITY)
Admission: RE | Admit: 2018-06-02 | Discharge: 2018-06-23 | DRG: 252 | Disposition: E | Payer: Medicare Other | Attending: Vascular Surgery | Admitting: Vascular Surgery

## 2018-06-02 DIAGNOSIS — Z6832 Body mass index (BMI) 32.0-32.9, adult: Secondary | ICD-10-CM

## 2018-06-02 DIAGNOSIS — R579 Shock, unspecified: Secondary | ICD-10-CM | POA: Diagnosis not present

## 2018-06-02 DIAGNOSIS — R609 Edema, unspecified: Secondary | ICD-10-CM | POA: Diagnosis present

## 2018-06-02 DIAGNOSIS — E669 Obesity, unspecified: Secondary | ICD-10-CM | POA: Diagnosis present

## 2018-06-02 DIAGNOSIS — I97638 Postprocedural hematoma of a circulatory system organ or structure following other circulatory system procedure: Secondary | ICD-10-CM | POA: Diagnosis not present

## 2018-06-02 DIAGNOSIS — X58XXXA Exposure to other specified factors, initial encounter: Secondary | ICD-10-CM | POA: Diagnosis present

## 2018-06-02 DIAGNOSIS — I509 Heart failure, unspecified: Secondary | ICD-10-CM | POA: Diagnosis not present

## 2018-06-02 DIAGNOSIS — Z950 Presence of cardiac pacemaker: Secondary | ICD-10-CM | POA: Diagnosis not present

## 2018-06-02 DIAGNOSIS — I700 Atherosclerosis of aorta: Secondary | ICD-10-CM | POA: Diagnosis present

## 2018-06-02 DIAGNOSIS — I4891 Unspecified atrial fibrillation: Secondary | ICD-10-CM | POA: Diagnosis present

## 2018-06-02 DIAGNOSIS — Z794 Long term (current) use of insulin: Secondary | ICD-10-CM

## 2018-06-02 DIAGNOSIS — Z7902 Long term (current) use of antithrombotics/antiplatelets: Secondary | ICD-10-CM

## 2018-06-02 DIAGNOSIS — Z992 Dependence on renal dialysis: Secondary | ICD-10-CM

## 2018-06-02 DIAGNOSIS — I2510 Atherosclerotic heart disease of native coronary artery without angina pectoris: Secondary | ICD-10-CM | POA: Diagnosis present

## 2018-06-02 DIAGNOSIS — I132 Hypertensive heart and chronic kidney disease with heart failure and with stage 5 chronic kidney disease, or end stage renal disease: Secondary | ICD-10-CM | POA: Diagnosis present

## 2018-06-02 DIAGNOSIS — I252 Old myocardial infarction: Secondary | ICD-10-CM

## 2018-06-02 DIAGNOSIS — Z87891 Personal history of nicotine dependence: Secondary | ICD-10-CM | POA: Diagnosis not present

## 2018-06-02 DIAGNOSIS — N186 End stage renal disease: Secondary | ICD-10-CM | POA: Diagnosis present

## 2018-06-02 DIAGNOSIS — R0602 Shortness of breath: Secondary | ICD-10-CM | POA: Diagnosis present

## 2018-06-02 DIAGNOSIS — I469 Cardiac arrest, cause unspecified: Secondary | ICD-10-CM | POA: Diagnosis not present

## 2018-06-02 DIAGNOSIS — S301XXA Contusion of abdominal wall, initial encounter: Secondary | ICD-10-CM | POA: Diagnosis present

## 2018-06-02 DIAGNOSIS — I739 Peripheral vascular disease, unspecified: Secondary | ICD-10-CM | POA: Diagnosis present

## 2018-06-02 DIAGNOSIS — E1151 Type 2 diabetes mellitus with diabetic peripheral angiopathy without gangrene: Secondary | ICD-10-CM | POA: Diagnosis present

## 2018-06-02 DIAGNOSIS — Z7901 Long term (current) use of anticoagulants: Secondary | ICD-10-CM

## 2018-06-02 DIAGNOSIS — E1122 Type 2 diabetes mellitus with diabetic chronic kidney disease: Secondary | ICD-10-CM | POA: Diagnosis present

## 2018-06-02 HISTORY — PX: ABDOMINAL AORTOGRAM: CATH118222

## 2018-06-02 HISTORY — PX: PERIPHERAL VASCULAR INTERVENTION: CATH118257

## 2018-06-02 HISTORY — PX: LOWER EXTREMITY ANGIOGRAPHY: CATH118251

## 2018-06-02 LAB — POCT ACTIVATED CLOTTING TIME
ACTIVATED CLOTTING TIME: 312 s
Activated Clotting Time: 345 seconds
Activated Clotting Time: 268 seconds
Activated Clotting Time: 164 seconds

## 2018-06-02 LAB — BASIC METABOLIC PANEL
Anion gap: 11 (ref 5–15)
BUN: 68 mg/dL — AB (ref 6–20)
CHLORIDE: 91 mmol/L — AB (ref 101–111)
CO2: 23 mmol/L (ref 22–32)
CREATININE: 5.15 mg/dL — AB (ref 0.61–1.24)
Calcium: 7.7 mg/dL — ABNORMAL LOW (ref 8.9–10.3)
GFR calc Af Amer: 11 mL/min — ABNORMAL LOW (ref >60)
GFR calc non Af Amer: 10 mL/min — ABNORMAL LOW (ref >60)
Glucose, Bld: 150 mg/dL — ABNORMAL HIGH (ref 65–99)
POTASSIUM: 4.5 mmol/L (ref 3.5–5.1)
Sodium: 125 mmol/L — ABNORMAL LOW (ref 135–145)

## 2018-06-02 LAB — POCT I-STAT, CHEM 8
BUN: 61 mg/dL — AB (ref 6–20)
CHLORIDE: 89 mmol/L — AB (ref 101–111)
CREATININE: 4.7 mg/dL — AB (ref 0.61–1.24)
Calcium, Ion: 1.07 mmol/L — ABNORMAL LOW (ref 1.15–1.40)
GLUCOSE: 97 mg/dL (ref 65–99)
HCT: 52 % (ref 39.0–52.0)
Hemoglobin: 17.7 g/dL — ABNORMAL HIGH (ref 13.0–17.0)
POTASSIUM: 4.5 mmol/L (ref 3.5–5.1)
Sodium: 125 mmol/L — ABNORMAL LOW (ref 135–145)
TCO2: 25 mmol/L (ref 22–32)

## 2018-06-02 LAB — PREPARE RBC (CROSSMATCH)

## 2018-06-02 LAB — CBC
HCT: 18.4 % — ABNORMAL LOW (ref 39.0–52.0)
HEMATOCRIT: 37.9 % — AB (ref 39.0–52.0)
HEMOGLOBIN: 6 g/dL — AB (ref 13.0–17.0)
HEMOGLOBIN: 12.6 g/dL — AB (ref 13.0–17.0)
MCH: 30.8 pg (ref 26.0–34.0)
MCH: 30.3 pg (ref 26.0–34.0)
MCHC: 32.6 g/dL (ref 30.0–36.0)
MCHC: 33.2 g/dL (ref 30.0–36.0)
MCV: 94.4 fL (ref 78.0–100.0)
MCV: 91.1 fL (ref 78.0–100.0)
Platelets: 41 10*3/uL — ABNORMAL LOW (ref 150–400)
Platelets: 75 10*3/uL — ABNORMAL LOW (ref 150–400)
RBC: 1.95 MIL/uL — AB (ref 4.22–5.81)
RBC: 4.16 MIL/uL — ABNORMAL LOW (ref 4.22–5.81)
RDW: 17.3 % — ABNORMAL HIGH (ref 11.5–15.5)
RDW: 17.4 % — AB (ref 11.5–15.5)
WBC: 3.2 10*3/uL — ABNORMAL LOW (ref 4.0–10.5)
WBC: 5.2 10*3/uL (ref 4.0–10.5)

## 2018-06-02 LAB — GLUCOSE, CAPILLARY
GLUCOSE-CAPILLARY: 111 mg/dL — AB (ref 65–99)
Glucose-Capillary: 122 mg/dL — ABNORMAL HIGH (ref 65–99)

## 2018-06-02 LAB — PROTIME-INR
INR: 1.26
Prothrombin Time: 15.7 seconds — ABNORMAL HIGH (ref 11.4–15.2)

## 2018-06-02 SURGERY — LOWER EXTREMITY ANGIOGRAPHY
Anesthesia: LOCAL

## 2018-06-02 MED ORDER — ONDANSETRON HCL 4 MG/2ML IJ SOLN: 4.0000 mg | Freq: Four times a day (QID) | INTRAMUSCULAR | Status: DC | PRN

## 2018-06-02 MED ORDER — ZOLPIDEM TARTRATE 5 MG PO TABS
10.0000 mg | ORAL_TABLET | Freq: Every evening | ORAL | Status: DC | PRN
  Administered 2018-06-02: 10 mg via ORAL
  Filled 2018-06-02: qty 2

## 2018-06-02 MED ORDER — OXYCODONE HCL 5 MG PO TABS: 5.0000 mg | ORAL_TABLET | ORAL | Status: DC | PRN

## 2018-06-02 MED ORDER — SODIUM CHLORIDE 0.9% FLUSH: 3.0000 mL | Freq: Two times a day (BID) | INTRAVENOUS | Status: DC

## 2018-06-02 MED ORDER — SODIUM CHLORIDE 0.9% FLUSH: 3.0000 mL | INTRAVENOUS | Status: DC | PRN

## 2018-06-02 MED ORDER — SODIUM CHLORIDE 0.9 % IV BOLUS
500.0000 mL | Freq: Once | INTRAVENOUS | Status: AC
  Administered 2018-06-02: 500 mL via INTRAVENOUS

## 2018-06-02 MED ORDER — MORPHINE SULFATE (PF) 10 MG/ML IV SOLN
2.0000 mg | INTRAVENOUS | Status: DC | PRN
  Administered 2018-06-02 (×2): 2 mg via INTRAVENOUS

## 2018-06-02 MED ORDER — PROTAMINE SULFATE 10 MG/ML IV SOLN
INTRAVENOUS | Status: AC
  Filled 2018-06-02: qty 5

## 2018-06-02 MED ORDER — MORPHINE SULFATE (PF) 4 MG/ML IV SOLN
INTRAVENOUS | Status: AC
  Filled 2018-06-02: qty 1

## 2018-06-02 MED ORDER — MIDAZOLAM HCL 2 MG/2ML IJ SOLN
INTRAMUSCULAR | Status: DC | PRN
  Administered 2018-06-02 (×2): 1 mg via INTRAVENOUS

## 2018-06-02 MED ORDER — SODIUM CHLORIDE 0.9 % IV SOLN
250.0000 mL | INTRAVENOUS | Status: DC | PRN
  Administered 2018-06-03 (×2): via INTRAVENOUS

## 2018-06-02 MED ORDER — CLOPIDOGREL BISULFATE 300 MG PO TABS
ORAL_TABLET | ORAL | Status: DC | PRN
  Administered 2018-06-02: 300 mg via ORAL

## 2018-06-02 MED ORDER — HEPARIN (PORCINE) IN NACL 2-0.9 UNITS/ML
INTRAMUSCULAR | Status: AC | PRN
  Administered 2018-06-02: 500 mL

## 2018-06-02 MED ORDER — PHENOL 1.4 % MT LIQD: 1.0000 | OROMUCOSAL | Status: DC | PRN

## 2018-06-02 MED ORDER — ACETAMINOPHEN 325 MG PO TABS
650.0000 mg | ORAL_TABLET | ORAL | Status: DC | PRN
  Administered 2018-06-02: 650 mg via ORAL

## 2018-06-02 MED ORDER — FENTANYL CITRATE (PF) 100 MCG/2ML IJ SOLN
INTRAMUSCULAR | Status: DC | PRN
  Administered 2018-06-02 (×2): 50 ug via INTRAVENOUS

## 2018-06-02 MED ORDER — HEPARIN SODIUM (PORCINE) 1000 UNIT/ML IJ SOLN
INTRAMUSCULAR | Status: DC | PRN
  Administered 2018-06-02: 8000 [IU] via INTRAVENOUS
  Administered 2018-06-02: 4000 [IU] via INTRAVENOUS

## 2018-06-02 MED ORDER — SODIUM CHLORIDE 0.9 % IV SOLN: Freq: Once | INTRAVENOUS | Status: DC

## 2018-06-02 MED ORDER — MIDAZOLAM HCL 2 MG/2ML IJ SOLN
INTRAMUSCULAR | Status: AC
  Filled 2018-06-02: qty 2

## 2018-06-02 MED ORDER — HEPARIN SODIUM (PORCINE) 1000 UNIT/ML IJ SOLN
INTRAMUSCULAR | Status: AC
  Filled 2018-06-02: qty 1

## 2018-06-02 MED ORDER — METOPROLOL TARTRATE 5 MG/5ML IV SOLN: 2.0000 mg | INTRAVENOUS | Status: DC | PRN

## 2018-06-02 MED ORDER — SODIUM CHLORIDE 0.9 % IV SOLN
INTRAVENOUS | Status: AC | PRN
  Administered 2018-06-02: 10 mL/h via INTRAVENOUS

## 2018-06-02 MED ORDER — FENTANYL CITRATE (PF) 100 MCG/2ML IJ SOLN
INTRAMUSCULAR | Status: AC
  Filled 2018-06-02: qty 2

## 2018-06-02 MED ORDER — LIDOCAINE HCL (PF) 1 % IJ SOLN
INTRAMUSCULAR | Status: AC
  Filled 2018-06-02: qty 30

## 2018-06-02 MED ORDER — POTASSIUM CHLORIDE CRYS ER 20 MEQ PO TBCR: 20.0000 meq | EXTENDED_RELEASE_TABLET | Freq: Once | ORAL | Status: DC

## 2018-06-02 MED ORDER — PANTOPRAZOLE SODIUM 40 MG PO TBEC: 40.0000 mg | DELAYED_RELEASE_TABLET | Freq: Every day | ORAL | Status: DC

## 2018-06-02 MED ORDER — MORPHINE SULFATE (PF) 2 MG/ML IV SOLN
2.0000 mg | INTRAVENOUS | Status: DC | PRN
  Administered 2018-06-02 – 2018-06-03 (×3): 2 mg via INTRAVENOUS
  Filled 2018-06-02 (×3): qty 1

## 2018-06-02 MED ORDER — LABETALOL HCL 5 MG/ML IV SOLN: 10.0000 mg | INTRAVENOUS | Status: DC | PRN

## 2018-06-02 MED ORDER — SODIUM CHLORIDE 0.9 % IV SOLN: 250.0000 mL | INTRAVENOUS | Status: DC | PRN

## 2018-06-02 MED ORDER — HEPARIN (PORCINE) IN NACL 1000-0.9 UT/500ML-% IV SOLN
INTRAVENOUS | Status: AC
  Filled 2018-06-02: qty 500

## 2018-06-02 MED ORDER — ACETAMINOPHEN 325 MG PO TABS
ORAL_TABLET | ORAL | Status: AC
  Filled 2018-06-02: qty 2

## 2018-06-02 MED ORDER — GUAIFENESIN-DM 100-10 MG/5ML PO SYRP: 15.0000 mL | ORAL_SOLUTION | ORAL | Status: DC | PRN

## 2018-06-02 MED ORDER — CLOPIDOGREL BISULFATE 75 MG PO TABS: 75.0000 mg | ORAL_TABLET | Freq: Every day | ORAL | Status: DC

## 2018-06-02 MED ORDER — CLOPIDOGREL BISULFATE 300 MG PO TABS
ORAL_TABLET | ORAL | Status: AC
  Filled 2018-06-02: qty 1

## 2018-06-02 MED ORDER — ACETAMINOPHEN 325 MG RE SUPP: 325.0000 mg | RECTAL | Status: DC | PRN

## 2018-06-02 MED ORDER — IODIXANOL 320 MG/ML IV SOLN
INTRAVENOUS | Status: DC | PRN
  Administered 2018-06-02: 180 mL via INTRA_ARTERIAL

## 2018-06-02 MED ORDER — CLOPIDOGREL BISULFATE 75 MG PO TABS: 300.0000 mg | ORAL_TABLET | Freq: Once | ORAL | Status: DC

## 2018-06-02 MED ORDER — CLOPIDOGREL BISULFATE 75 MG PO TABS: 75.0000 mg | ORAL_TABLET | Freq: Every day | ORAL | 3 refills | Status: AC

## 2018-06-02 MED ORDER — PROTAMINE SULFATE 10 MG/ML IV SOLN
50.0000 mg | Freq: Once | INTRAVENOUS | Status: AC
  Administered 2018-06-02: 30 mg via INTRAVENOUS

## 2018-06-02 MED ORDER — ATROPINE SULFATE 1 MG/10ML IJ SOSY
PREFILLED_SYRINGE | INTRAMUSCULAR | Status: AC
  Filled 2018-06-02: qty 10

## 2018-06-02 MED ORDER — LIDOCAINE HCL (PF) 1 % IJ SOLN
INTRAMUSCULAR | Status: DC | PRN
  Administered 2018-06-02: 18 mL
  Administered 2018-06-02: 12 mL

## 2018-06-02 MED ORDER — ACETAMINOPHEN 325 MG PO TABS
325.0000 mg | ORAL_TABLET | ORAL | Status: DC | PRN
  Administered 2018-06-03: 650 mg via ORAL
  Filled 2018-06-02: qty 2

## 2018-06-02 MED ORDER — ATROPINE SULFATE 1 MG/10ML IJ SOSY
0.5000 mg | PREFILLED_SYRINGE | Freq: Once | INTRAMUSCULAR | Status: AC
  Administered 2018-06-02: 0.5 mg via INTRAVENOUS

## 2018-06-02 SURGICAL SUPPLY — 20 items
CATH ANGIO 5F BER2 65CM (CATHETERS) ×4 IMPLANT
CATH OMNI FLUSH 5F 65CM (CATHETERS) ×4 IMPLANT
CATH QUICKCROSS SUPP .035X90CM (MICROCATHETER) ×4 IMPLANT
CATH STRAIGHT 5FR 65CM (CATHETERS) ×4 IMPLANT
COVER PRB 48X5XTLSCP FOLD TPE (BAG) ×3 IMPLANT
COVER PROBE 5X48 (BAG) ×1
DEVICE ONE SNARE 10MM (MISCELLANEOUS) ×4 IMPLANT
DEVICE TORQUE .025-.038 (MISCELLANEOUS) ×4 IMPLANT
GLIDEWIRE ADV .035X180CM (WIRE) ×4 IMPLANT
KIT ENCORE 26 ADVANTAGE (KITS) ×8 IMPLANT
KIT MICROPUNCTURE NIT STIFF (SHEATH) ×4 IMPLANT
KIT PV (KITS) ×4 IMPLANT
SHEATH AVANTI 11CM 5FR (SHEATH) ×8 IMPLANT
SHEATH BRITE TIP 7FR 35CM (SHEATH) ×8 IMPLANT
STENT VIABAHN 8X39X80 VBX (Permanent Stent) ×8 IMPLANT
SYR MEDRAD MARK V 150ML (SYRINGE) ×4 IMPLANT
TRANSDUCER W/STOPCOCK (MISCELLANEOUS) ×4 IMPLANT
TRAY PV CATH (CUSTOM PROCEDURE TRAY) ×4 IMPLANT
WIRE BENTSON .035X145CM (WIRE) ×4 IMPLANT
WIRE ROSEN-J .035X180CM (WIRE) ×4 IMPLANT

## 2018-06-02 NOTE — Telephone Encounter (Signed)
-----   Message from Penni Homans, RN sent at 06/13/2018  2:41 PM EDT ----- Regarding: Appointment   ----- Message ----- From: Waynetta Sandy, MD Sent: 05/28/2018   1:33 PM To: [facility] [address] [place] [id number] 01-25-1941  05/29/2018 Pre-operative Diagnosis: peripheral arterial disease  Surgeon:  [name] Paschal. Donzetta Matters, MD  Procedure Performed: 1.  Ultrasound-guided cannulation bilateral common femoral arteries 2.  Aortogram with bilateral lower extremity runoff 3.  Stent of bilateral common iliac arteries with 8 x 39 mm VBX 4.  Moderate sedation with fentanyl and Versed for 100 minutes 5.  Snare of fracture quick cross catheter.   F/u with me in 4-5 weeks with aortoiliac duplex and wound check

## 2018-06-02 NOTE — Op Note (Signed)
Patient name: [name] MANLOVE, [name] MRN: [id number] DOB: [birth year] Sex: male  06/05/2018 Pre-operative Diagnosis: peripheral arterial disease Post-operative diagnosis:  Same Surgeon:  Eda Paschal. Donzetta Matters, [name] Procedure Performed: 1.  Ultrasound-guided cannulation bilateral common femoral arteries 2.  Aortogram with bilateral lower extremity runoff 3.  Stent of bilateral common iliac arteries with 8 x 39 mm VBX 4.  Moderate sedation with fentanyl and Versed for 100 minutes 5.  Snare of fracture quick cross catheter.   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  He has no superficial venous reflux for intervention does have arterial disease.  He is on dialysis.  He is indicated for angiogram possible intervention on the bilateral lower extremities.  Findings: The common iliac arteries bilaterally are occluded.  On the right side is very short segment on the left side is nearly the entirety of the common iliac artery.  The aorta is heavily calcified but free of flow-limiting stenoses.  The left lower extremity is patent to the popliteal artery.  The right lower extremity is patent to the below-knee popliteal artery and in the anterior tibial artery has disease posterior tibial is the dominant runoff to the ankle but gives out there.  Anterior tibial artery is possibly occluded or has high-grade stenoses decreasing the flow.  We are unable to fully visualize the left lower extremity below the knee much of this was due to the occlusive disease of higher.  During the course of this procedure a quick cross catheter was fractured but was not left as retained foreign body as the entire thing was removed.   Where the bilateral common iliac arteries were previously occluded there is now with no flow-limiting stenosis or dissection.     Procedure:  The patient was identified in the holding area and taken to room 8.  The patient was then placed supine  on the table and prepped and draped in the usual sterile fashion.  A time out was called.  I used ultrasound to identify first the left common femoral artery noted to be patent and soft and an image was saved to the permanent record.  Cannulated with a micropuncture needle under direct visualization and a wire and sheath were placed.  We then placed a short 5 French sheath and attempted to cross first with a Bentson wire then with very catheter and Glidewire advantage but we are unable to cross.  We then on the right side identified the right common femoral artery with ultrasound guidance and it was also noted to be patent and soft.  Image was saved to the permanent record.  We then cannulated under direct guidance with micropuncture needle followed by wire and sheath.  We then placed a short 5 French sheath.  Patient was given 4000 units of heparin at this time.  We then used a Glidewire advantage bare catheter to attempt to cross the occluded common iliac artery after we performed retrograde angiogram.  We ultimately able to get through the aorta and confirmed with intraluminal access.  The Rosen wire was then placed into the aorta.  From the left side we then used very catheter and Glidewire advantage where we get the wire through but the catheter would not cross.  We then used a quick cross catheter.  In attempting to remove the quick cross catheter and fractured.  We then added on a Glidewire advantage and this was placed up into the descending thoracic aorta.  Bilateral lower extremity runoff was obtained with the above findings.  We then elected to intervene on the bilateral common iliac arteries.  We then exchanged for long 7 French sheaths bilaterally.  As an attempt to snare the retained quick cross catheter we then put a 1 cm loop snare up through the bare catheter on the right after exchanging over the [employer] wire.  We are then able to snare the Glidewire advantage and kiss our two 7 [place] sheaths.  We  then used a straight catheter to push the quick cross catheter out through the left side over the wire we did have to remove the sheath pulled the catheter off of the wire and we confirmed we had all 3 points in the end of the catheter was intact.  We will place her 7 [place] sheath.  We then brought 8 x 39 mm VBX stents from either side and deployed them sequentially over the up and over snared wire.  We then remove the wire placed up into the aorta with an Omni catheter and performed aortogram which demonstrated flow through both stents without flow-limiting stenosis.  There is heavy calcification that does obstruct the view but there does not appear to be any flow-limiting stenosis or dissection.  Satisfied with this the wire catheter removed and she is to be pulled in postoperative holding.  Patient tolerated procedure well without any comp occasion.  Next  Contrast: 140cc   Nasiyah Laverdiere C. Donzetta Matters, [name] [facility] [place] Office: [phone] Pager: [phone]

## 2018-06-02 NOTE — Telephone Encounter (Signed)
sch appt lvm 07/11/18 8am Aorto/iliac 845am wound check

## 2018-06-02 NOTE — Progress Notes (Signed)
Site area: RFA/ LFA Site Prior to Removal:  Level 1/1 Pressure Applied For:30 min each side Manual:  yes  Patient Status During Pull:  hypotensive Post Pull Site:  Level 2 / 2 Post Pull Instructions Given:  yes Post Pull Pulses Present: doppler Dressing Applied:  clear Bedrest begins @ 1715 Comments:

## 2018-06-02 NOTE — Progress Notes (Signed)
Spoke with Dr. Donzetta Matters about the repeat HGB result of 12.6 and the decision was made to hold on the 2nd unit of blood.  2nd unit returned to the blood bank.

## 2018-06-02 NOTE — H&P (Signed)
   History and Physical Update  The patient was interviewed and re-examined.  The patient's previous History and Physical has been reviewed and is unchanged from recent office visit. Plan for aortogram with bilateral runoff possible intervention of right.   Sharronda Schweers C. Donzetta Matters, MD [facility] [place] Office: [phone] Pager: [phone]   05/30/2018, 9:36 AM

## 2018-06-03 ENCOUNTER — Inpatient Hospital Stay (HOSPITAL_COMMUNITY): Payer: Medicare Other | Admitting: Certified Registered Nurse Anesthetist

## 2018-06-03 ENCOUNTER — Encounter (HOSPITAL_COMMUNITY): Admission: RE | Disposition: E | Payer: Self-pay | Source: Home / Self Care | Attending: Vascular Surgery

## 2018-06-03 ENCOUNTER — Encounter (HOSPITAL_COMMUNITY): Payer: Self-pay | Admitting: Vascular Surgery

## 2018-06-03 DIAGNOSIS — I97638 Postprocedural hematoma of a circulatory system organ or structure following other circulatory system procedure: Secondary | ICD-10-CM

## 2018-06-03 HISTORY — PX: APPLICATION OF WOUND VAC: SHX5189

## 2018-06-03 HISTORY — PX: HEMATOMA EVACUATION: SHX5118

## 2018-06-03 LAB — PROTIME-INR
INR: 1.4
INR: 1.52
Prothrombin Time: 17.1 seconds — ABNORMAL HIGH (ref 11.4–15.2)
Prothrombin Time: 18.2 seconds — ABNORMAL HIGH (ref 11.4–15.2)

## 2018-06-03 LAB — BASIC METABOLIC PANEL
ANION GAP: 12 (ref 5–15)
BUN: 74 mg/dL — AB (ref 6–20)
CALCIUM: 7.7 mg/dL — AB (ref 8.9–10.3)
CO2: 20 mmol/L — ABNORMAL LOW (ref 22–32)
Chloride: 90 mmol/L — ABNORMAL LOW (ref 101–111)
Creatinine, Ser: 6.26 mg/dL — ABNORMAL HIGH (ref 0.61–1.24)
GFR calc Af Amer: 9 mL/min — ABNORMAL LOW (ref >60)
GFR, EST NON AFRICAN AMERICAN: 8 mL/min — AB (ref >60)
Glucose, Bld: 201 mg/dL — ABNORMAL HIGH (ref 65–99)
Potassium: 5.4 mmol/L — ABNORMAL HIGH (ref 3.5–5.1)
SODIUM: 122 mmol/L — AB (ref 135–145)

## 2018-06-03 LAB — CBC WITH DIFFERENTIAL/PLATELET
Abs Immature Granulocytes: 0 10*3/uL (ref 0.0–0.1)
BASOS ABS: 0 10*3/uL (ref 0.0–0.1)
BASOS PCT: 0 %
EOS PCT: 0 %
Eosinophils Absolute: 0 10*3/uL (ref 0.0–0.7)
HCT: 35.3 % — ABNORMAL LOW (ref 39.0–52.0)
Hemoglobin: 12.1 g/dL — ABNORMAL LOW (ref 13.0–17.0)
Immature Granulocytes: 0 %
Lymphocytes Relative: 3 %
Lymphs Abs: 0.2 10*3/uL — ABNORMAL LOW (ref 0.7–4.0)
MCH: 30.6 pg (ref 26.0–34.0)
MCHC: 34.3 g/dL (ref 30.0–36.0)
MCV: 89.4 fL (ref 78.0–100.0)
MONO ABS: 0.7 10*3/uL (ref 0.1–1.0)
Monocytes Relative: 8 %
Neutro Abs: 8 10*3/uL — ABNORMAL HIGH (ref 1.7–7.7)
Neutrophils Relative %: 89 %
PLATELETS: 98 10*3/uL — AB (ref 150–400)
RBC: 3.95 MIL/uL — AB (ref 4.22–5.81)
RDW: 17.1 % — AB (ref 11.5–15.5)
WBC: 9 10*3/uL (ref 4.0–10.5)

## 2018-06-03 LAB — CBC
HCT: 30.9 % — ABNORMAL LOW (ref 39.0–52.0)
HEMOGLOBIN: 10.7 g/dL — AB (ref 13.0–17.0)
MCH: 30.7 pg (ref 26.0–34.0)
MCHC: 34.6 g/dL (ref 30.0–36.0)
MCV: 88.5 fL (ref 78.0–100.0)
Platelets: 116 10*3/uL — ABNORMAL LOW (ref 150–400)
RBC: 3.49 MIL/uL — AB (ref 4.22–5.81)
RDW: 17.2 % — ABNORMAL HIGH (ref 11.5–15.5)
WBC: 10.7 10*3/uL — ABNORMAL HIGH (ref 4.0–10.5)

## 2018-06-03 LAB — GLUCOSE, CAPILLARY: Glucose-Capillary: 235 mg/dL — ABNORMAL HIGH (ref 65–99)

## 2018-06-03 LAB — POCT I-STAT 4, (NA,K, GLUC, HGB,HCT)
Glucose, Bld: 206 mg/dL — ABNORMAL HIGH (ref 65–99)
HCT: 30 % — ABNORMAL LOW (ref 39.0–52.0)
HEMOGLOBIN: 10.2 g/dL — AB (ref 13.0–17.0)
Potassium: 5.3 mmol/L — ABNORMAL HIGH (ref 3.5–5.1)
Sodium: 122 mmol/L — ABNORMAL LOW (ref 135–145)

## 2018-06-03 LAB — POCT I-STAT 7, (LYTES, BLD GAS, ICA,H+H)
Acid-base deficit: 19 mmol/L — ABNORMAL HIGH (ref 0.0–2.0)
BICARBONATE: 11.9 mmol/L — AB (ref 20.0–28.0)
Calcium, Ion: 1.1 mmol/L — ABNORMAL LOW (ref 1.15–1.40)
HCT: 21 % — ABNORMAL LOW (ref 39.0–52.0)
HEMOGLOBIN: 7.1 g/dL — AB (ref 13.0–17.0)
O2 Saturation: 80 %
PCO2 ART: 54.6 mmHg — AB (ref 32.0–48.0)
PO2 ART: 71 mmHg — AB (ref 83.0–108.0)
Potassium: 3.8 mmol/L (ref 3.5–5.1)
Sodium: 127 mmol/L — ABNORMAL LOW (ref 135–145)
TCO2: 14 mmol/L — ABNORMAL LOW (ref 22–32)
pH, Arterial: 6.946 — CL (ref 7.350–7.450)

## 2018-06-03 SURGERY — EVACUATION HEMATOMA
Anesthesia: General | Site: Groin | Laterality: Right

## 2018-06-03 MED ORDER — AMIODARONE IV BOLUS ONLY 150 MG/100ML
INTRAVENOUS | Status: DC | PRN
  Administered 2018-06-03: 150 mg via INTRAVENOUS

## 2018-06-03 MED ORDER — SODIUM CHLORIDE 0.9 % IV SOLN: INTRAVENOUS | Status: DC

## 2018-06-03 MED ORDER — FENTANYL CITRATE (PF) 100 MCG/2ML IJ SOLN: 25.0000 ug | INTRAMUSCULAR | Status: DC | PRN

## 2018-06-03 MED ORDER — EPHEDRINE SULFATE-NACL 50-0.9 MG/10ML-% IV SOSY
PREFILLED_SYRINGE | INTRAVENOUS | Status: DC | PRN
  Administered 2018-06-03 (×5): 10 mg via INTRAVENOUS

## 2018-06-03 MED ORDER — ALBUMIN HUMAN 5 % IV SOLN
12.5000 g | Freq: Once | INTRAVENOUS | Status: AC
  Administered 2018-06-03: 12.5 g via INTRAVENOUS
  Filled 2018-06-03: qty 250

## 2018-06-03 MED ORDER — ALBUMIN HUMAN 5 % IV SOLN
INTRAVENOUS | Status: DC | PRN
  Administered 2018-06-03 (×2): via INTRAVENOUS

## 2018-06-03 MED ORDER — CALCIUM CHLORIDE 10 % IV SOLN
INTRAVENOUS | Status: DC | PRN
  Administered 2018-06-03 (×2): 50 mg via INTRAVENOUS

## 2018-06-03 MED ORDER — FENTANYL CITRATE (PF) 250 MCG/5ML IJ SOLN
INTRAMUSCULAR | Status: AC
  Filled 2018-06-03: qty 5

## 2018-06-03 MED ORDER — PROPOFOL 10 MG/ML IV BOLUS
INTRAVENOUS | Status: AC
  Filled 2018-06-03: qty 20

## 2018-06-03 MED ORDER — VASOPRESSIN 20 UNIT/ML IV SOLN
INTRAVENOUS | Status: DC | PRN
  Administered 2018-06-03 (×2): 5 [IU] via INTRAVENOUS
  Administered 2018-06-03 (×4): 2.5 [IU] via INTRAVENOUS

## 2018-06-03 MED ORDER — 0.9 % SODIUM CHLORIDE (POUR BTL) OPTIME
TOPICAL | Status: DC | PRN
  Administered 2018-06-03: 1000 mL

## 2018-06-03 MED ORDER — PROPOFOL 10 MG/ML IV BOLUS
INTRAVENOUS | Status: DC | PRN
  Administered 2018-06-03: 30 mg via INTRAVENOUS

## 2018-06-03 MED ORDER — PROTAMINE SULFATE 10 MG/ML IV SOLN
INTRAVENOUS | Status: AC
  Filled 2018-06-03: qty 5

## 2018-06-03 MED ORDER — SODIUM CHLORIDE 0.9 % IV SOLN
INTRAVENOUS | Status: AC
  Filled 2018-06-03: qty 1.2

## 2018-06-03 MED ORDER — ROCURONIUM BROMIDE 10 MG/ML (PF) SYRINGE
PREFILLED_SYRINGE | INTRAVENOUS | Status: AC
  Filled 2018-06-03: qty 5

## 2018-06-03 MED ORDER — PHENYLEPHRINE HCL 10 MG/ML IJ SOLN
INTRAVENOUS | Status: DC | PRN
  Administered 2018-06-03: 50 ug/min via INTRAVENOUS

## 2018-06-03 MED ORDER — HEPARIN SODIUM (PORCINE) 1000 UNIT/ML IJ SOLN
INTRAMUSCULAR | Status: AC
  Filled 2018-06-03: qty 1

## 2018-06-03 MED ORDER — THROMBIN 20000 UNITS EX SOLR
CUTANEOUS | Status: AC
  Filled 2018-06-03: qty 20000

## 2018-06-03 MED ORDER — SUCCINYLCHOLINE CHLORIDE 20 MG/ML IJ SOLN
INTRAMUSCULAR | Status: DC | PRN
  Administered 2018-06-03: 100 mg via INTRAVENOUS

## 2018-06-03 MED ORDER — PROTAMINE SULFATE 10 MG/ML IV SOLN
INTRAVENOUS | Status: DC | PRN
  Administered 2018-06-03: 40 mg via INTRAVENOUS

## 2018-06-03 MED ORDER — ETOMIDATE 2 MG/ML IV SOLN
INTRAVENOUS | Status: DC | PRN
  Administered 2018-06-03: 20 mg via INTRAVENOUS

## 2018-06-03 MED ORDER — CLINDAMYCIN PHOSPHATE 600 MG/50ML IV SOLN
600.0000 mg | INTRAVENOUS | Status: AC
  Administered 2018-06-03: 600 mg via INTRAVENOUS
  Filled 2018-06-03: qty 50

## 2018-06-03 MED ORDER — SODIUM BICARBONATE 8.4 % IV SOLN
INTRAVENOUS | Status: DC | PRN
  Administered 2018-06-03 (×2): 50 meq via INTRAVENOUS

## 2018-06-03 MED ORDER — EPINEPHRINE PF 1 MG/ML IJ SOLN
INTRAVENOUS | Status: DC | PRN
  Administered 2018-06-03: 5 ug/min via INTRAVENOUS

## 2018-06-03 MED ORDER — HEPARIN SODIUM (PORCINE) 1000 UNIT/ML IJ SOLN
INTRAMUSCULAR | Status: DC | PRN
  Administered 2018-06-03: 9000 [IU] via INTRAVENOUS

## 2018-06-03 MED ORDER — EPINEPHRINE PF 1 MG/10ML IJ SOSY
PREFILLED_SYRINGE | INTRAMUSCULAR | Status: DC | PRN
  Administered 2018-06-03: 0.5 mg via INTRAVENOUS
  Administered 2018-06-03: .5 mg via INTRAVENOUS
  Administered 2018-06-03: 1 mg via INTRAVENOUS
  Administered 2018-06-03 (×3): .5 mg via INTRAVENOUS
  Administered 2018-06-03 (×2): 1 mg via INTRAVENOUS

## 2018-06-03 MED ORDER — ETOMIDATE 2 MG/ML IV SOLN
INTRAVENOUS | Status: AC
  Filled 2018-06-03: qty 10

## 2018-06-03 SURGICAL SUPPLY — 57 items
BANDAGE ACE 4X5 VEL STRL LF (GAUZE/BANDAGES/DRESSINGS) IMPLANT
BANDAGE ACE 6X5 VEL STRL LF (GAUZE/BANDAGES/DRESSINGS) IMPLANT
BNDG GAUZE ELAST 4 BULKY (GAUZE/BANDAGES/DRESSINGS) IMPLANT
BOOT SUTURE AID YELLOW STND (SUTURE) ×4 IMPLANT
CANISTER PREVENA PLUS 150 (CANNISTER) ×4 IMPLANT
CANISTER SUCT 3000ML PPV (MISCELLANEOUS) ×4 IMPLANT
CLIP VESOCCLUDE MED 24/CT (CLIP) ×4 IMPLANT
CLIP VESOCCLUDE SM WIDE 24/CT (CLIP) ×4 IMPLANT
COVER SURGICAL LIGHT HANDLE (MISCELLANEOUS) ×4 IMPLANT
DRAIN CHANNEL 19F RND (DRAIN) ×8 IMPLANT
DRAPE EXTREMITY T 121X128X90 (DRAPE) IMPLANT
DRAPE HALF SHEET 40X57 (DRAPES) IMPLANT
DRAPE INCISE IOBAN 66X45 STRL (DRAPES) IMPLANT
DRAPE ORTHO SPLIT 77X108 STRL (DRAPES)
DRAPE SURG ORHT 6 SPLT 77X108 (DRAPES) IMPLANT
DRSG ADAPTIC 3X8 NADH LF (GAUZE/BANDAGES/DRESSINGS) IMPLANT
ELECT CAUTERY BLADE 6.4 (BLADE) ×4 IMPLANT
ELECT REM PT RETURN 9FT ADLT (ELECTROSURGICAL) ×4
ELECTRODE REM PT RTRN 9FT ADLT (ELECTROSURGICAL) ×2 IMPLANT
EVACUATOR SILICONE 100CC (DRAIN) ×8 IMPLANT
GAUZE SPONGE 4X4 12PLY STRL LF (GAUZE/BANDAGES/DRESSINGS) ×4 IMPLANT
GLOVE BIO SURGEON STRL SZ7.5 (GLOVE) ×4 IMPLANT
GLOVE BIOGEL PI IND STRL 6.5 (GLOVE) ×8 IMPLANT
GLOVE BIOGEL PI INDICATOR 6.5 (GLOVE) ×8
GOWN STRL REUS W/ TWL LRG LVL3 (GOWN DISPOSABLE) ×4 IMPLANT
GOWN STRL REUS W/ TWL XL LVL3 (GOWN DISPOSABLE) ×4 IMPLANT
GOWN STRL REUS W/TWL LRG LVL3 (GOWN DISPOSABLE) ×4
GOWN STRL REUS W/TWL XL LVL3 (GOWN DISPOSABLE) ×4
HIBICLENS 32OZ XXX (MISCELLANEOUS) ×4 IMPLANT
KIT BASIN OR (CUSTOM PROCEDURE TRAY) ×4 IMPLANT
KIT DRSG PREVENA PLUS 7DAY 125 (MISCELLANEOUS) ×4 IMPLANT
KIT PREVENA INCISION MGT 13 (CANNISTER) ×4 IMPLANT
KIT TURNOVER KIT B (KITS) ×4 IMPLANT
LOOP VESSEL MAXI BLUE (MISCELLANEOUS) ×4 IMPLANT
LOOP VESSEL MINI RED (MISCELLANEOUS) ×4 IMPLANT
NS IRRIG 1000ML POUR BTL (IV SOLUTION) ×4 IMPLANT
PACK GENERAL/GYN (CUSTOM PROCEDURE TRAY) ×4 IMPLANT
PACK UNIVERSAL I (CUSTOM PROCEDURE TRAY) ×4 IMPLANT
PAD ARMBOARD 7.5X6 YLW CONV (MISCELLANEOUS) ×8 IMPLANT
SOAP 2 % CHG 4 OZ (WOUND CARE) ×4 IMPLANT
SPONGE SURGIFOAM ABS GEL 100 (HEMOSTASIS) ×4 IMPLANT
SUT ETHILON 3 0 FSL (SUTURE) ×4 IMPLANT
SUT ETHILON 3 0 PS 1 (SUTURE) ×16 IMPLANT
SUT PROLENE 5 0 C 1 24 (SUTURE) ×4 IMPLANT
SUT PROLENE 5 0 C 1 36 (SUTURE) ×4 IMPLANT
SUT SILK 3 0 (SUTURE) ×2
SUT SILK 3-0 18XBRD TIE 12 (SUTURE) ×2 IMPLANT
SUT VIC AB 2-0 CT1 27 (SUTURE)
SUT VIC AB 2-0 CT1 TAPERPNT 27 (SUTURE) IMPLANT
SUT VIC AB 2-0 CTB1 (SUTURE) ×4 IMPLANT
SUT VIC AB 3-0 SH 27 (SUTURE) ×2
SUT VIC AB 3-0 SH 27X BRD (SUTURE) ×2 IMPLANT
SUT VICRYL 4-0 PS2 18IN ABS (SUTURE) IMPLANT
TOWEL GREEN STERILE (TOWEL DISPOSABLE) ×8 IMPLANT
TOWEL GREEN STERILE FF (TOWEL DISPOSABLE) ×4 IMPLANT
TRAY FOLEY SLVR 16FR LF STAT (SET/KITS/TRAYS/PACK) ×4 IMPLANT
WATER STERILE IRR 1000ML POUR (IV SOLUTION) ×4 IMPLANT

## 2018-06-05 ENCOUNTER — Encounter (HOSPITAL_COMMUNITY): Payer: Self-pay | Admitting: Vascular Surgery

## 2018-06-05 LAB — BPAM RBC
BLOOD PRODUCT EXPIRATION DATE: 201907072359
Blood Product Expiration Date: 201907022359
Blood Product Expiration Date: 201907052359
Blood Product Expiration Date: 201907072359
ISSUE DATE / TIME: 201906101911
ISSUE DATE / TIME: 201906111214
ISSUE DATE / TIME: 201906101911
ISSUE DATE / TIME: 201906111214
UNIT TYPE AND RH: 5100
UNIT TYPE AND RH: 5100
UNIT TYPE AND RH: 9500
Unit Type and Rh: 9500

## 2018-06-05 LAB — TYPE AND SCREEN
ABO/RH(D): O POS
Antibody Screen: NEGATIVE
UNIT DIVISION: 0
UNIT DIVISION: 0
Unit division: 0
Unit division: 0

## 2018-06-06 MED FILL — Morphine Sulfate Inj 4 MG/ML: INTRAMUSCULAR | Qty: 0.5 | Status: AC

## 2018-06-20 ENCOUNTER — Encounter (HOSPITAL_COMMUNITY): Payer: Medicare Other | Admitting: Internal Medicine

## 2018-06-23 NOTE — Progress Notes (Signed)
Patient arrived to short stay with pressure in the 80's, atrial fibulation, alert and oriented x 4, and pale. Dr. Nyoka Cowden made aware. Dr. Donzetta Matters notified states the pressures are the patient's normal and he is ok with lab work.

## 2018-06-23 NOTE — Progress Notes (Signed)
Late entry-  Called STAT to PACU for ventilator.  Upon entering room, noted pt being ambu bagged on 100% fio2 by CRNA.  Dr Scot Dock, Dr Orene Desanctis, CRNA x2, RN x 2 at bedside.  Per RN, pt is "dying" and MD request pt be placed on vent, pt placed on Vt 669ml (99ml/kg), Rate 16, 100%, 5 peep.  Wife came to bedside.  Pt remained on vent until CRNA and RN x 2 confirmed cardiac death, it was requested by CRNA and wife that pt be removed from ventilator.

## 2018-06-23 NOTE — Progress Notes (Addendum)
Report called to Sam, RN at this time. Patient taken to the OR

## 2018-06-23 NOTE — Progress Notes (Signed)
   Patient received CPR and shock in the operating room.  He is now stabilized although remains on phenylephrine and Levophed and is been transferred to the PACU.  Per the wife's wishes she wants no further chest compressions or shocks but would be amenable to continued increase in medical coding.  Wessie Shanks C. Donzetta Matters, MD [facility] [place] Office: [phone] Pager: [phone]

## 2018-06-23 NOTE — Consult Note (Signed)
            [facility] Primary Care Navigator  07-01-2018  Marylynn Pearson II, MD May 15, 1941 [id number]   Attemptto see patient at the bedside to identify possible discharge needs but he is off the unit, in the OR at the moment. (groin debridement- right groin hematoma).   Will attempt to see patient at another time when available in the room.   Addendum:  Attempted to go back and see patient at the bedside but staff reports that he had expired.  Primary care provider's office was called Caryl Pina) to notify of patient's death and was informed that they were already notified earlier.   For additional questions please contact:  Edwena Felty A. Eldonna Neuenfeldt, BSN, RN-BC [facility] PRIMARY CARE Navigator Cell: [phone]

## 2018-06-23 NOTE — Progress Notes (Signed)
Paged Dr. Donzetta Matters about low blood pressures systolic in 25Z-56L. Decision was made to continue to monitor pt if alert and oriented. Pt has been stable throughout the night. BL groins increased to level 3 and have severe hematoma around incisions. R groin gauze was changed. Pt requests to leave in the morning. Wife called asking about pt's status. Wife notified that pt was stable and sleeping.   Fransico Michael, RN

## 2018-06-23 NOTE — Op Note (Signed)
    NAME: RACHEL SAMPLES, MD    MRN: [id number] DOB: 19-Mar-1941    DATE OF OPERATION: 06/22/18  PREOP DIAGNOSIS:    Right groin hematoma  POSTOP DIAGNOSIS:    Same  PROCEDURE:    Exploration of right groin and repair of right common femoral artery  SURGEON: Judeth Cornfield. 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  He presents for evacuation and exploration of the right groin.  FINDINGS:   There was a large amount hematoma present.  The cannulation site was on the anterior aspect of the distal common femoral artery with no complicating features.  Two 5-0 Prolene sutures were placed here.  As the final skin closure was being completed the patient became hypotensive and sustained a cardiac arrest.  He underwent cardiopulmonary resuscitation and was able to be resuscitated and brought to the recovery room.  After consultation with the family the decision was made that we would continue full support with medications but that if he arrested again he would have no further shocks.   TECHNIQUE:   The patient was taken to the operating room and received general anesthetic.  The lower abdomen right groin and right thigh were prepped and draped in usual sterile fashion.  A longitudinal incision was made over the large hematoma.  Proximally I dissected down just beneath the inguinal ligament and was able to dissected out the common femoral artery for proximal control.  The patient was heparinized.  Next the incision was continued through the hematoma and a large amount of clot was evacuated.  Once the clot was evacuated there was some bleeding from the site of cannulation.   The artery was dissected free and the site of cannulation was easily repaired with two 5-0 Prolene sutures.   I fully explored the artery and there was no other evidence of bleeding or injury.  The wound was irrigated with cups amounts of saline.  219 Blake drains were placed.  The wound was then closed with a deep layer of 2-0 Vicryl.  The subcutaneous layer with 3-0 Vicryl and the skin closed with staples.  A Praveena dressing was applied.  As noted as the wound was being closed the patient sustained a cardiac arrest and required full cardiopulmonary resuscitation.  He was transferred to the recovery room in critical condition.  Deitra Mayo, MD, FACS [facility]  DATE OF DICTATION:   06-22-18

## 2018-06-23 NOTE — Anesthesia Preprocedure Evaluation (Addendum)
Anesthesia Evaluation  Patient identified by MRN, date of birth, ID band Patient awake    Reviewed: Allergy & Precautions  Airway Mallampati: II       Dental  (+) Edentulous Upper   Pulmonary shortness of breath, sleep apnea , former smoker,    breath sounds clear to auscultation       Cardiovascular hypertension, + CAD, + Past MI, + Peripheral Vascular Disease and +CHF  + dysrhythmias + pacemaker  Rhythm:Irregular Rate:Normal  Low BPs noted   Neuro/Psych    GI/Hepatic (+) Hepatitis -  Endo/Other  diabetes  Renal/GU ESRF and DialysisRenal disease     Musculoskeletal  (+) Arthritis ,   Abdominal (+) + obese,   Peds  Hematology   Anesthesia Other Findings   Reproductive/Obstetrics                            Anesthesia Physical Anesthesia Plan  ASA: IV  Anesthesia Plan: General   Post-op Pain Management:    Induction: Intravenous  PONV Risk Score and Plan:   Airway Management Planned: Oral ETT  Additional Equipment:   Intra-op Plan:   Post-operative Plan: Extubation in OR and Possible Post-op intubation/ventilation  Informed Consent: I have reviewed the patients History and Physical, chart, labs and discussed the procedure including the risks, benefits and alternatives for the proposed anesthesia with the patient or authorized representative who has indicated his/her understanding and acceptance.     Plan Discussed with: CRNA  Anesthesia Plan Comments: (Arrives  To holding Afib, BP 70 syst, somnolent . Apparentlyl bleeding into thigh from his Surgery yesterday)      Anesthesia Quick Evaluation

## 2018-06-23 NOTE — Transfer of Care (Signed)
Immediate Anesthesia Transfer of Care Note  Patient: [name] Fiddler, MD  Procedure(s) Performed: EVACUATION HEMATOMA OF RIGHT GROIN (Right Groin) APPLICATION OF INCISIONAL WOUND VAC (Right Groin)  Patient Location: PACU  Anesthesia Type:General  Level of Consciousness: obtunded  Airway & Oxygen Therapy: Pt expired at 47  Post-op Assessment: Family, Esaw Grandchild at bedside  Post vital signs: unstable  Last Vitals:  Vitals Value Taken Time  BP    Temp    Pulse    Resp    SpO2      Last Pain:  Vitals:   24-Jun-2018 0756  TempSrc:   PainSc: 0-No pain         Complications: death

## 2018-06-23 NOTE — Progress Notes (Addendum)
[facility] [place]  Subjective  - Right groin is painful.   Objective (!) 76/54 73 98.1 F (36.7 C) (Oral) 11 100% No intake or output data in the 24 hours ending 06-18-18 0731  Right groin with hematoma and skin tear from edema, tight to palpation. Left groin with ecchymosis, no frank hematoma Right DP and left PT signals via doppler Heart irregular known A fib, 77 bpm Hypotensive asymptomatic, alert and oriented.      Assessment/Planning: POD # 1 angiogram with B iliac stent placement Right groin hematoma HGB 12.1 pending morning lab that was just drawn. NPO pending HGB, possible will order CTA if drop in HGB.    Roxy Horseman Jun 18, 2018 7:31 AM --  Laboratory Lab Results: Recent Labs    06/20/2018 1932 June 18, 2018 0130  WBC 5.2 9.0  HGB 12.6* 12.1*  HCT 37.9* 35.3*  PLT 75* 98*   BMET Recent Labs    05/24/2018 0956 06/04/2018 1932  NA 125* 125*  K 4.5 4.5  CL 89* 91*  CO2  --  23  GLUCOSE 97 150*  BUN 61* 68*  CREATININE 4.70* 5.15*  CALCIUM  --  7.7*    COAG Lab Results  Component Value Date   INR 1.40 06/18/2018   INR 1.26 06/04/2018   INR 2.63 04/27/2017   No results found for: PTT   I have independently interviewed and examined patient and agree with PA assessment and plan above.  We will proceed to the OR this morning for operative exploration of right groin.  I discussed this with the patient and his wife they are in understanding of the risk and benefits and agreed to proceed.  Antonius Hartlage C. Donzetta Matters, MD [facility] [place] Office: [phone] Pager: [phone]

## 2018-06-23 NOTE — OR Nursing (Signed)
Maintained care of patient during transfer to PACU isolated room by anesthesia, OR and PACU team.

## 2018-06-23 NOTE — Anesthesia Postprocedure Evaluation (Signed)
Anesthesia Post Note  Patient: [name] Fiddler, MD  Procedure(s) Performed: EVACUATION HEMATOMA OF RIGHT GROIN (Right Groin) APPLICATION OF INCISIONAL WOUND VAC (Right Groin)     Patient location during evaluation: PACU Anesthesia Type: General Level of consciousness: sedated Respiratory status: patient on ventilator - see flowsheet for VS and respiratory function unstable Cardiovascular status: bradycardic and unstable : patient expired in PACU after Cardiac event in OR. Anesthetic complications: no    Last Vitals:  Vitals:   28-Jun-2018 1005 06-28-18 1010  BP: 97/74   Pulse: 81 78  Resp: 18 18  Temp:    SpO2: 99% 99%    Last Pain:  Vitals:   28-Jun-2018 0756  TempSrc:   PainSc: 0-No pain                 Mont Jagoda,JAMES TERRILL

## 2018-06-23 NOTE — Discharge Summary (Signed)
Physician Discharge Summary  Patient ID: [name] BORGWARDT, MD MRN: [id number] DOB/AGE: 07-27-1941 77 y.o.  Admit date: 06/08/2018 Discharge date: June 21, 2018  Admission Diagnosis: Peripheral arterial disease with right lower extremity wound  Discharge Diagnoses:  Peripheral arterial disease with right lower extremity wound Deceased  Secondary Diagnoses: Active Problems:   PAD (peripheral artery disease) ([place])   Procedures: On 06/09/2018 he underwent ultrasound-guided cannulation of his bilateral common femoral arteries with stents of his bilateral common iliac arteries  On 06-21-2018 he underwent expiration of his right groin and repair of right common femoral artery  Discharged Condition: deceased  Hospital Course: Patient was admitted to watch his bilateral groin hematomas after initial procedure.  Nephrology was consulted as he has home peritoneal dialysis.  The next morning he was noted to have ruptured a very large hematoma in his right groin and was indicated for urgent exploration.  He was taken to the operating room at completion of the procedure he dropped his pressure was started on pressor agents and at the completion of procedure remain intubated.  He had undergone multiple rounds of chest compressions and shock but did have return of vital signs.  There is a decision to proceed with no further compressions or shocks but continue medical escalation of care.  Ultimately the patient expired on 2018-06-21.  Consults:  Treatment Team:  Estanislado Emms, MD Roney Jaffe, MD  Significant Diagnostic Studies: CBC CBC Latest Ref Rng & Units 06-21-2018 06/21/18 06-21-18  WBC 4.0 - 10.5 K/uL - - 10.7(H)  Hemoglobin 13.0 - 17.0 g/dL 7.1(L) 10.2(L) 10.7(L)  Hematocrit 39.0 - 52.0 % 21.0(L) 30.0(L) 30.9(L)  Platelets 150 - 400 K/uL - - 116(L)     BMET @LASTCHEMISTRY @  [facility] Lab Results  Component Value Date   INR 1.52 June 21, 2018   INR 1.40 Jun 21, 2018   INR 1.26  06/11/2018   No results found for: PTT    Allergies as of 2018/06/21      Reactions   Actos [pioglitazone Hydrochloride] Swelling, Other (See Comments)   Severe peripheral edema   Codeine Other (See Comments)   Makes Pt aggressive   Depakote [divalproex Sodium] Diarrhea   severe   Diltiazem Other (See Comments)   lethargy and dyspnea   Hydralazine Other (See Comments)   Severe chills and SOB   Isosorbide Dinitrate Other (See Comments)   Severe chills and SOB   Januvia [sitagliptin] Diarrhea, Other (See Comments)   bradycardia   Latex Swelling, Anaphylaxis, Hives   Loop Diuretics Other (See Comments)   Spike in BUN and creatine levels   Losartan Other (See Comments)   aphasia   Nsaids Other (See Comments)   Renal problems   Onglyza [saxagliptin] Diarrhea, Other (See Comments)   bradycardia   Orudis [ketoprofen] Other (See Comments)   Cannot take due to renal insufficiency   Penicillins Swelling, Other (See Comments)   Serum sickness Has patient had a PCN reaction causing immediate rash, facial/tongue/throat swelling, SOB or lightheadedness with hypotension: Yes Has patient had a PCN reaction causing severe rash involving mucus membranes or skin necrosis: No Has patient had a PCN reaction that required hospitalization Yes Has patient had a PCN reaction occurring within the last 10 years: No If all of the above answers are "NO", then may proceed with Cephalosporin use.   Potassium Iodide Itching, Rash, Other (See Comments)   Severe entire body itching and rash   Rozerem [ramelteon] Diarrhea, Other (See Comments)   Severe  Amiodarone Other (See Comments)   diarrhea   Verapamil Other (See Comments)   Severe fatigue   Benazepril Hcl Other (See Comments)   ? Possible lowers platelets   Gentamycin [gentamicin] Rash   Indomethacin Other (See Comments)   Renal problems   Minocycline Other (See Comments)   Makes dizzy and felt just lousy.   Other Rash   Pentazocine  Lactate Other (See Comments)   Unknown allergic reaction - pt and wife do not recall this   Poison Ivy Extract [poison Ivy Extract] Rash, Other (See Comments)   blisters   Sertraline Hcl Other (See Comments)   Pt and wife do not recall this   Shellfish Allergy Rash      Medication List    TAKE these medications   clopidogrel 75 MG tablet Commonly known as:  PLAVIX Take 1 tablet (75 mg total) by mouth daily.     ASK your doctor about these medications   acetaminophen 500 MG tablet Commonly known as:  TYLENOL Take 1,000 mg by mouth 2 (two) times daily as needed for moderate pain or headache.   allopurinol 100 MG tablet Commonly known as:  ZYLOPRIM Take 200 mg by mouth daily.   cetirizine 10 MG tablet Commonly known as:  ZYRTEC Take 10 mg by mouth daily as needed for allergies.   diazepam 10 MG tablet Commonly known as:  VALIUM Take 10 mg by mouth daily as needed (for muscle spasms).   diphenoxylate-atropine 2.5-0.025 MG tablet Commonly known as:  LOMOTIL Take 2 tablets by mouth daily as needed for diarrhea or loose stools.   enoxaparin 100 MG/ML injection Commonly known as:  LOVENOX Inject 100 mg into the skin daily.   ketoconazole 2 % cream Commonly known as:  NIZORAL Apply 1 application topically 2 (two) times daily as needed for irritation.   LANTUS SOLOSTAR 100 UNIT/ML Solostar Pen Generic drug:  Insulin Glargine Inject 12-14 Units into the skin See admin instructions. Inject 12 units SQ in the morning and inject 14 units SQ in the evening   metoprolol tartrate 50 MG tablet Commonly known as:  LOPRESSOR TAKE ONE TABLET BY MOUTH TWICE A DAY   multivitamin Tabs tablet Take 1 tablet by mouth daily.   mupirocin cream 2 % Commonly known as:  BACTROBAN Apply 1 application topically 2 (two) times daily as needed (for wound care).   neomycin-bacitracin-polymyxin ointment Commonly known as:  NEOSPORIN Apply 1 application topically 2 (two) times daily as needed  for wound care.   sevelamer carbonate 800 MG tablet Commonly known as:  RENVELA Take 1,600 mg by mouth 3 (three) times daily with meals.   terazosin 5 MG capsule Commonly known as:  HYTRIN Take 1 capsule (5 mg total) by mouth every morning.   testosterone cypionate 200 MG/ML injection Commonly known as:  DEPOTESTOSTERONE CYPIONATE Inject 200 mg into the muscle every 14 (fourteen) days.   torsemide 20 MG tablet Commonly known as:  DEMADEX Take 2 tablets (40 mg total) by mouth every morning.   traMADol 50 MG tablet Commonly known as:  ULTRAM Take 50-100 mg by mouth daily as needed for moderate pain.   triamcinolone cream 0.1 % Commonly known as:  KENALOG Apply 1 application topically 2 (two) times daily as needed (for wound care).   warfarin 1 MG tablet Commonly known as:  COUMADIN Take as directed. If you are unsure how to take this medication, talk to your nurse or doctor. Original instructions:  Take 1 tablet (1  mg total) by mouth daily.   XIFAXAN 550 MG Tabs tablet Generic drug:  rifaximin Take 550 mg by mouth 2 (two) times daily.   zolpidem 10 MG tablet Commonly known as:  AMBIEN Take 1 tablet (10 mg total) by mouth at bedtime as needed for sleep.        Signed:  Eda Paschal. Donzetta Matters, MD [facility] Office: [phone] Pager: [phone]   06/06/2018, 5:41 PM

## 2018-06-23 NOTE — Progress Notes (Signed)
Dr. Donzetta Matters at bedside to discuss POC with patient.

## 2018-06-23 NOTE — Anesthesia Procedure Notes (Signed)
Procedure Name: Intubation Date/Time: 06/24/18 10:59 AM Performed by: Eulas Post, Pancho Rushing W, CRNA Pre-anesthesia Checklist: Patient identified, Emergency Drugs available, Suction available and Patient being monitored Patient Re-evaluated:Patient Re-evaluated prior to induction Oxygen Delivery Method: Circle system utilized Preoxygenation: Pre-oxygenation with 100% oxygen Induction Type: IV induction Ventilation: Mask ventilation without difficulty Laryngoscope Size: Miller and 2 Grade View: Grade I Tube type: Oral Tube size: 7.5 mm Number of attempts: 1 Airway Equipment and Method: Stylet and Oral airway Placement Confirmation: ETT inserted through vocal cords under direct vision,  positive ETCO2 and breath sounds checked- equal and bilateral Secured at: 24 cm Tube secured with: Tape Dental Injury: Teeth and Oropharynx as per pre-operative assessment

## 2018-06-23 DEATH — deceased

## 2018-07-07 ENCOUNTER — Encounter (HOSPITAL_COMMUNITY): Payer: Self-pay

## 2018-07-11 ENCOUNTER — Ambulatory Visit: Payer: Medicare Other | Admitting: Vascular Surgery

## 2018-07-11 ENCOUNTER — Encounter (HOSPITAL_COMMUNITY): Payer: Medicare Other

## 2018-09-29 ENCOUNTER — Encounter: Payer: Medicare Other | Admitting: Internal Medicine
